# Patient Record
Sex: Female | Born: 1955 | Race: White | Hispanic: No | Marital: Married | Smoking: Former smoker
Health system: Southern US, Community
[De-identification: ages and names within clinical notes are randomized; demographics above are authoritative.]

## PROBLEM LIST (undated history)

## (undated) DIAGNOSIS — L309 Dermatitis, unspecified: Secondary | ICD-10-CM

## (undated) DIAGNOSIS — C801 Malignant (primary) neoplasm, unspecified: Secondary | ICD-10-CM

## (undated) DIAGNOSIS — I10 Essential (primary) hypertension: Secondary | ICD-10-CM

## (undated) DIAGNOSIS — R112 Nausea with vomiting, unspecified: Secondary | ICD-10-CM

## (undated) DIAGNOSIS — T7840XA Allergy, unspecified, initial encounter: Secondary | ICD-10-CM

## (undated) DIAGNOSIS — F17200 Nicotine dependence, unspecified, uncomplicated: Secondary | ICD-10-CM

## (undated) DIAGNOSIS — J45909 Unspecified asthma, uncomplicated: Secondary | ICD-10-CM

## (undated) DIAGNOSIS — L0390 Cellulitis, unspecified: Secondary | ICD-10-CM

## (undated) DIAGNOSIS — H57059 Tonic pupil, unspecified eye: Secondary | ICD-10-CM

## (undated) DIAGNOSIS — Z923 Personal history of irradiation: Secondary | ICD-10-CM

## (undated) DIAGNOSIS — T8859XA Other complications of anesthesia, initial encounter: Secondary | ICD-10-CM

## (undated) DIAGNOSIS — T4145XA Adverse effect of unspecified anesthetic, initial encounter: Secondary | ICD-10-CM

## (undated) DIAGNOSIS — I700 Atherosclerosis of aorta: Secondary | ICD-10-CM

## (undated) DIAGNOSIS — Z9889 Other specified postprocedural states: Secondary | ICD-10-CM

## (undated) HISTORY — PX: CATARACT EXTRACTION W/ INTRAOCULAR LENS IMPLANT: SHX1309

## (undated) HISTORY — DX: Atherosclerosis of aorta: I70.0

## (undated) HISTORY — DX: Essential (primary) hypertension: I10

## (undated) HISTORY — DX: Unspecified asthma, uncomplicated: J45.909

## (undated) HISTORY — DX: Dermatitis, unspecified: L30.9

## (undated) HISTORY — DX: Allergy, unspecified, initial encounter: T78.40XA

## (undated) HISTORY — DX: Nicotine dependence, unspecified, uncomplicated: F17.200

## (undated) HISTORY — PX: ABDOMINAL HYSTERECTOMY: SHX81

---

## 1977-08-30 HISTORY — PX: BREAST SURGERY: SHX581

## 1977-08-30 HISTORY — PX: BREAST EXCISIONAL BIOPSY: SUR124

## 1999-11-30 ENCOUNTER — Encounter: Admission: RE | Admit: 1999-11-30 | Discharge: 1999-11-30 | Payer: Self-pay | Admitting: Obstetrics and Gynecology

## 1999-11-30 ENCOUNTER — Encounter: Payer: Self-pay | Admitting: Obstetrics and Gynecology

## 2003-09-30 ENCOUNTER — Inpatient Hospital Stay (HOSPITAL_COMMUNITY): Admission: EM | Admit: 2003-09-30 | Discharge: 2003-10-01 | Payer: Self-pay | Admitting: Emergency Medicine

## 2004-01-02 ENCOUNTER — Other Ambulatory Visit: Admission: RE | Admit: 2004-01-02 | Discharge: 2004-01-02 | Payer: Self-pay | Admitting: Gynecology

## 2005-05-11 ENCOUNTER — Encounter: Admission: RE | Admit: 2005-05-11 | Discharge: 2005-05-11 | Payer: Self-pay | Admitting: Gynecology

## 2005-05-21 ENCOUNTER — Encounter: Admission: RE | Admit: 2005-05-21 | Discharge: 2005-05-21 | Payer: Self-pay | Admitting: Gynecology

## 2005-06-21 ENCOUNTER — Other Ambulatory Visit: Admission: RE | Admit: 2005-06-21 | Discharge: 2005-06-21 | Payer: Self-pay | Admitting: Gynecology

## 2006-02-15 ENCOUNTER — Encounter: Admission: RE | Admit: 2006-02-15 | Discharge: 2006-02-15 | Payer: Self-pay | Admitting: Neurology

## 2006-10-25 ENCOUNTER — Other Ambulatory Visit: Admission: RE | Admit: 2006-10-25 | Discharge: 2006-10-25 | Payer: Self-pay | Admitting: Gynecology

## 2006-10-31 ENCOUNTER — Encounter: Admission: RE | Admit: 2006-10-31 | Discharge: 2006-10-31 | Payer: Self-pay | Admitting: Gynecology

## 2014-11-28 ENCOUNTER — Other Ambulatory Visit: Payer: Self-pay | Admitting: Family Medicine

## 2015-01-09 ENCOUNTER — Encounter: Payer: Self-pay | Admitting: *Deleted

## 2015-01-13 ENCOUNTER — Encounter: Payer: Self-pay | Admitting: Family Medicine

## 2015-01-13 ENCOUNTER — Ambulatory Visit (INDEPENDENT_AMBULATORY_CARE_PROVIDER_SITE_OTHER): Payer: 59 | Admitting: Family Medicine

## 2015-01-13 VITALS — BP 156/86 | HR 88 | Temp 98.0°F | Resp 18 | Ht 61.0 in | Wt 132.0 lb

## 2015-01-13 DIAGNOSIS — Z7189 Other specified counseling: Secondary | ICD-10-CM

## 2015-01-13 DIAGNOSIS — Z716 Tobacco abuse counseling: Secondary | ICD-10-CM | POA: Diagnosis not present

## 2015-01-13 DIAGNOSIS — F17200 Nicotine dependence, unspecified, uncomplicated: Secondary | ICD-10-CM | POA: Insufficient documentation

## 2015-01-13 DIAGNOSIS — I10 Essential (primary) hypertension: Secondary | ICD-10-CM

## 2015-01-13 DIAGNOSIS — J4530 Mild persistent asthma, uncomplicated: Secondary | ICD-10-CM

## 2015-01-13 DIAGNOSIS — Z720 Tobacco use: Secondary | ICD-10-CM

## 2015-01-13 DIAGNOSIS — Z7689 Persons encountering health services in other specified circumstances: Secondary | ICD-10-CM

## 2015-01-13 MED ORDER — VARENICLINE TARTRATE 0.5 MG X 11 & 1 MG X 42 PO MISC: ORAL | Status: DC

## 2015-01-13 NOTE — Progress Notes (Signed)
Subjective:    Patient ID: Krystal Brooks, female    DOB: 1955/10/26, 59 y.o.   MRN: [id number]  HPI Patient is a very pleasant 59 year old white female who is here today to establish care. Past medical history is significant for essential hypertension. She states her blood pressure is better controlled at home and that she is a little nervous today in the office. She also has a history of tobacco abuse. She also has a history of mild persistent asthma. Allergies are certainly a trigger for her. At the present time her asthma is relatively well controlled on a combination of Singulair and dulera.  She requires albuterol 2-3 times a month. Unfortunate she continues to smoke although she is very interested in smoking cessation. She will schedule her mammogram later this month. Her last colonoscopy was 3 years ago and is not due again for 7 years. She also has a past medical history of a hysterectomy and therefore does not require Pap smears. She has a history of reactions to immunizations and she refuses any immunizations including a pneumonia shot. Past Medical History  Diagnosis Date  . Allergy   . Asthma   . Hypertension   . Smoker    Past Surgical History  Procedure Laterality Date  . Cesarean section    . Abdominal hysterectomy     Current Outpatient Prescriptions on File Prior to Visit  Medication Sig Dispense Refill  . albuterol (PROVENTIL HFA;VENTOLIN HFA) 108 (90 BASE) MCG/ACT inhaler Inhale 2 puffs into the lungs every 6 (six) hours as needed for wheezing or shortness of breath.    . losartan-hydrochlorothiazide (HYZAAR) 50-12.5 MG per tablet Take 1 tablet by mouth daily.    . mometasone-formoterol (DULERA) 100-5 MCG/ACT AERO Inhale 2 puffs into the lungs daily.    . montelukast (SINGULAIR) 10 MG tablet Take 10 mg by mouth at bedtime.     No current facility-administered medications on file prior to visit.   Allergies  Allergen Reactions  . Penicillins Swelling and Rash  .  Sulfa Antibiotics Rash   History   Social History  . Marital Status: Married    Spouse Name: N/A  . Number of Children: N/A  . Years of Education: N/A   Occupational History  . Not on file.   Social History Main Topics  . Smoking status: Current Every Day Smoker -- 0.50 packs/day    Types: Cigarettes  . Smokeless tobacco: Never Used  . Alcohol Use: No  . Drug Use: No  . Sexual Activity: Yes    Birth Control/ Protection: Surgical     Comment: hysterectomy   Other Topics Concern  . Not on file   Social History Narrative      Review of Systems  All other systems reviewed and are negative.      Objective:   Physical Exam  Constitutional: She appears well-developed and well-nourished.  HENT:  Mouth/Throat: Oropharynx is clear and moist.  Eyes: Conjunctivae are normal. Pupils are equal, round, and reactive to light.  Neck: Neck supple. No JVD present. No thyromegaly present.  Cardiovascular: Normal rate, regular rhythm and normal heart sounds.   No murmur heard. Pulmonary/Chest: Effort normal and breath sounds normal. No respiratory distress. She has no wheezes. She has no rales.  Abdominal: Soft. Bowel sounds are normal. She exhibits no distension. There is no tenderness. There is no rebound and no guarding.  Musculoskeletal: She exhibits no edema.  Lymphadenopathy:    She has no cervical adenopathy.  Vitals reviewed.         Assessment & Plan:  Encounter for smoking cessation counseling - Plan: varenicline (CHANTIX STARTING MONTH PAK) 0.5 MG X 11 & 1 MG X 42 tablet  Benign essential HTN - Plan: CBC with Differential/Platelet, COMPLETE METABOLIC PANEL WITH GFR, Lipid panel  Asthma, chronic, mild persistent, uncomplicated  Establishing care with new doctor, encounter for  Physical exam is normal. I'm concerned about her blood pressure. I have asked her to check her blood pressure everyday for the next week and report the values to me. If increased, I would  add amlodipine. I asked her to return fasting for a CBC, fasting lipid panel, and CMP. I recommended smoking cessation and gave the patient a prescription for Chantix starter pack. Also offer the patient a pneumonia vaccine but she declined that today. She will schedule her mammogram and her colonoscopy is up-to-date.

## 2015-01-24 ENCOUNTER — Other Ambulatory Visit: Payer: Self-pay | Admitting: Family Medicine

## 2015-01-24 NOTE — Telephone Encounter (Signed)
I called patient and reminded her that she needs to return for the fasting lab work provider ordered at her last visit.  One month refill BP med sent

## 2015-02-17 ENCOUNTER — Other Ambulatory Visit: Payer: 59

## 2015-02-17 LAB — COMPLETE METABOLIC PANEL WITH GFR
ALBUMIN: 4.2 g/dL (ref 3.5–5.2)
ALT: 16 U/L (ref 0–35)
AST: 16 U/L (ref 0–37)
Alkaline Phosphatase: 53 U/L (ref 39–117)
BUN: 11 mg/dL (ref 6–23)
CALCIUM: 9.3 mg/dL (ref 8.4–10.5)
CHLORIDE: 95 meq/L — AB (ref 96–112)
CO2: 28 meq/L (ref 19–32)
Creat: 0.57 mg/dL (ref 0.50–1.10)
GFR, Est African American: >89 mL/min
GFR, Est Non African American: >89 mL/min
GLUCOSE: 108 mg/dL — AB (ref 70–99)
Potassium: 4.2 mEq/L (ref 3.5–5.3)
Sodium: 134 mEq/L — ABNORMAL LOW (ref 135–145)
Total Bilirubin: 0.5 mg/dL (ref 0.2–1.2)
Total Protein: 6.8 g/dL (ref 6.0–8.3)

## 2015-02-17 LAB — CBC WITH DIFFERENTIAL/PLATELET
Basophils Absolute: 0 10*3/uL (ref 0.0–0.1)
Basophils Relative: 0 % (ref 0–1)
Eosinophils Absolute: 0.1 10*3/uL (ref 0.0–0.7)
Eosinophils Relative: 1 % (ref 0–5)
HCT: 37.9 % (ref 36.0–46.0)
Hemoglobin: 12.6 g/dL (ref 12.0–15.0)
LYMPHS PCT: 39 % (ref 12–46)
Lymphs Abs: 2.1 10*3/uL (ref 0.7–4.0)
MCH: 29 pg (ref 26.0–34.0)
MCHC: 33.2 g/dL (ref 30.0–36.0)
MCV: 87.1 fL (ref 78.0–100.0)
MPV: 10.2 fL (ref 8.6–12.4)
Monocytes Absolute: 0.5 10*3/uL (ref 0.1–1.0)
Monocytes Relative: 9 % (ref 3–12)
NEUTROS ABS: 2.7 10*3/uL (ref 1.7–7.7)
NEUTROS PCT: 51 % (ref 43–77)
PLATELETS: 192 10*3/uL (ref 150–400)
RBC: 4.35 MIL/uL (ref 3.87–5.11)
RDW: 13.9 % (ref 11.5–15.5)
WBC: 5.3 10*3/uL (ref 4.0–10.5)

## 2015-02-17 LAB — LIPID PANEL
CHOL/HDL RATIO: 3.3 ratio
CHOLESTEROL: 195 mg/dL (ref 0–200)
HDL: 60 mg/dL (ref >=46)
LDL Cholesterol: 119 mg/dL — ABNORMAL HIGH (ref 0–99)
TRIGLYCERIDES: 81 mg/dL (ref <150)
VLDL: 16 mg/dL (ref 0–40)

## 2015-02-18 ENCOUNTER — Encounter: Payer: Self-pay | Admitting: Family Medicine

## 2015-02-21 ENCOUNTER — Other Ambulatory Visit: Payer: Self-pay | Admitting: Family Medicine

## 2015-02-22 ENCOUNTER — Other Ambulatory Visit: Payer: Self-pay | Admitting: Family Medicine

## 2015-05-13 ENCOUNTER — Other Ambulatory Visit: Payer: Self-pay | Admitting: Family Medicine

## 2015-05-16 ENCOUNTER — Ambulatory Visit (INDEPENDENT_AMBULATORY_CARE_PROVIDER_SITE_OTHER): Payer: Commercial Managed Care - HMO | Admitting: Family Medicine

## 2015-05-16 ENCOUNTER — Encounter: Payer: Self-pay | Admitting: Family Medicine

## 2015-05-16 VITALS — BP 142/80 | HR 104 | Temp 98.8°F | Resp 22 | Wt 137.0 lb

## 2015-05-16 DIAGNOSIS — J45901 Unspecified asthma with (acute) exacerbation: Secondary | ICD-10-CM

## 2015-05-16 MED ORDER — PREDNISONE 20 MG PO TABS
ORAL_TABLET | ORAL | Status: DC
Start: 2015-05-16 — End: 2015-05-20

## 2015-05-16 MED ORDER — ALBUTEROL SULFATE HFA 108 (90 BASE) MCG/ACT IN AERS: INHALATION_SPRAY | RESPIRATORY_TRACT | Status: DC

## 2015-05-16 NOTE — Progress Notes (Signed)
   Subjective:    Patient ID: Krystal Brooks, female    DOB: 02-10-1956, 59 y.o.   MRN: [id number]  HPI Patient is a very pleasant 59 year old white female with a history of moderate persistent asthma complicated by smoking. Beginning Monday she developed increasing shortness of breath and wheezing. This was triggered by hayfever. They're cutting hay next her home. Is steadily worsened throughout the week. Now she complains of tightness in her chest, persistent cough, shortness of breath, and wheezing. She is having to use her albuterol every 4 hours with minimal relief. She denies any chest pain Past Medical History  Diagnosis Date  . Allergy   . Asthma   . Hypertension   . Smoker    Past Surgical History  Procedure Laterality Date  . Cesarean section    . Abdominal hysterectomy     Current Outpatient Prescriptions on File Prior to Visit  Medication Sig Dispense Refill  . losartan-hydrochlorothiazide (HYZAAR) 50-12.5 MG per tablet TAKE 1 TABLET BY MOUTH EVERY DAY 30 tablet 2  . mometasone-formoterol (DULERA) 100-5 MCG/ACT AERO Inhale 2 puffs into the lungs daily.    . montelukast (SINGULAIR) 10 MG tablet TAKE 1 TABLET BY MOUTH EVERY DAY 30 tablet 5   No current facility-administered medications on file prior to visit.   Allergies  Allergen Reactions  . Penicillins Swelling and Rash  . Sulfa Antibiotics Rash   Social History   Social History  . Marital Status: Married    Spouse Name: N/A  . Number of Children: N/A  . Years of Education: N/A   Occupational History  . Not on file.   Social History Main Topics  . Smoking status: Current Every Day Smoker -- 0.50 packs/day    Types: Cigarettes  . Smokeless tobacco: Never Used  . Alcohol Use: No  . Drug Use: No  . Sexual Activity: Yes    Birth Control/ Protection: Surgical     Comment: hysterectomy   Other Topics Concern  . Not on file   Social History Narrative      Review of Systems  All other systems reviewed  and are negative.      Objective:   Physical Exam  Cardiovascular: Normal rate, regular rhythm and normal heart sounds.   Pulmonary/Chest: Effort normal. She has decreased breath sounds. She has wheezes.  Vitals reviewed.         Assessment & Plan:  Asthma with acute exacerbation, unspecified asthma severity - Plan: predniSONE (DELTASONE) 20 MG tablet  Patient is having an asthma exacerbation. Begin prednisone taper pack. Use albuterol 2 puffs inhaled every 4-6 hours. Recheck Monday or immediately if worse.

## 2015-05-20 ENCOUNTER — Telehealth: Payer: Self-pay | Admitting: Family Medicine

## 2015-05-20 DIAGNOSIS — J45901 Unspecified asthma with (acute) exacerbation: Secondary | ICD-10-CM

## 2015-05-20 MED ORDER — PREDNISONE 20 MG PO TABS: ORAL_TABLET | ORAL | Status: DC

## 2015-05-20 NOTE — Telephone Encounter (Signed)
Per your note you wanted her to follow up Monday but no appt was made

## 2015-05-20 NOTE — Telephone Encounter (Signed)
Bump prednisone up to 60 mg poqday for 5 days straight. Get CXR if worsening.

## 2015-05-20 NOTE — Telephone Encounter (Signed)
Patient is not much better than she was her last visit would like a call back to figure out what she should do next  She is going out of town  [phone]

## 2015-05-20 NOTE — Telephone Encounter (Signed)
Pt aware, med sent to pharm and she will wait to see what the pred does before getting a CXR

## 2015-06-06 ENCOUNTER — Other Ambulatory Visit: Payer: Self-pay | Admitting: Family Medicine

## 2015-06-06 NOTE — Telephone Encounter (Signed)
[place] with continuing month pack.

## 2015-06-06 NOTE — Telephone Encounter (Signed)
Medication refilled per protocol. 

## 2015-06-06 NOTE — Telephone Encounter (Signed)
OK 

## 2015-06-12 ENCOUNTER — Encounter: Payer: Self-pay | Admitting: Family Medicine

## 2015-06-12 ENCOUNTER — Ambulatory Visit (INDEPENDENT_AMBULATORY_CARE_PROVIDER_SITE_OTHER): Payer: Commercial Managed Care - HMO | Admitting: Family Medicine

## 2015-06-12 VITALS — BP 140/78 | HR 82 | Temp 98.0°F | Resp 16 | Ht 61.0 in | Wt 138.0 lb

## 2015-06-12 DIAGNOSIS — J410 Simple chronic bronchitis: Secondary | ICD-10-CM

## 2015-06-12 DIAGNOSIS — B001 Herpesviral vesicular dermatitis: Secondary | ICD-10-CM | POA: Diagnosis not present

## 2015-06-12 MED ORDER — VALACYCLOVIR HCL 1 G PO TABS: 2000.0000 mg | ORAL_TABLET | Freq: Two times a day (BID) | ORAL | Status: DC

## 2015-06-12 NOTE — Progress Notes (Signed)
Subjective:    Patient ID: Krystal Brooks, female    DOB: 12-Nov-1955, 59 y.o.   MRN: [id number]  HPI I saw the patient in early September. At that time I gave her a prednisone taper pack. I presume to be asthmatic bronchitis/asthma exacerbation. Symptoms improved but never completely went away. She took an additional prednisone pack shortly thereafter. After taking this prednisone, she developed myalgias, body aches, fevers, chill, and worsening chest congestion. She was seen in urgent care and diagnosed with walking pneumonia and was given clarithromycin and prednisone. She continues to have chest congestion, shortness of breath, dyspnea on exertion, and wheezing. However she states that she is much better she is still not back to normal. She also continues to have a productive cough. Patient is a 30+ pack year smoker with an underlying history of moderate persistent asthma. She's also developed a cold sore on her upper lip Past Medical History  Diagnosis Date  . Allergy   . Asthma   . Hypertension   . Smoker    Past Surgical History  Procedure Laterality Date  . Cesarean section    . Abdominal hysterectomy     Current Outpatient Prescriptions on File Prior to Visit  Medication Sig Dispense Refill  . albuterol (VENTOLIN HFA) 108 (90 BASE) MCG/ACT inhaler USE 1-2 PUFFS EVERY 6 HOURS AS NEEDED FOR WHEEZING 18 Inhaler 3  . CHANTIX STARTING MONTH PAK 0.5 MG X 11 & 1 MG X 42 tablet TAKE AS DIRECTED 53 tablet 0  . losartan-hydrochlorothiazide (HYZAAR) 50-12.5 MG per tablet TAKE 1 TABLET BY MOUTH EVERY DAY 30 tablet 2  . mometasone-formoterol (DULERA) 100-5 MCG/ACT AERO Inhale 2 puffs into the lungs daily.    . montelukast (SINGULAIR) 10 MG tablet TAKE 1 TABLET BY MOUTH EVERY DAY 30 tablet 5  . predniSONE (DELTASONE) 20 MG tablet 3 tabs poqday x 5 days 15 tablet 0   No current facility-administered medications on file prior to visit.   Allergies  Allergen Reactions  . Penicillins Swelling  and Rash  . Sulfa Antibiotics Rash   Social History   Social History  . Marital Status: Married    Spouse Name: N/A  . Number of Children: N/A  . Years of Education: N/A   Occupational History  . Not on file.   Social History Main Topics  . Smoking status: Current Every Day Smoker -- 0.50 packs/day    Types: Cigarettes  . Smokeless tobacco: Never Used  . Alcohol Use: No  . Drug Use: No  . Sexual Activity: Yes    Birth Control/ Protection: Surgical     Comment: hysterectomy   Other Topics Concern  . Not on file   Social History Narrative      Review of Systems  All other systems reviewed and are negative.      Objective:   Physical Exam  Constitutional: She appears well-developed and well-nourished.  HENT:  Right Ear: External ear normal.  Left Ear: External ear normal.  Nose: Nose normal.  Mouth/Throat: Oropharynx is clear and moist. No oropharyngeal exudate.  Neck: Neck supple.  Cardiovascular: Normal rate, regular rhythm and normal heart sounds.   Pulmonary/Chest: Effort normal. No respiratory distress. She has decreased breath sounds. She has wheezes. She has no rales.  Abdominal: Soft. Bowel sounds are normal. She exhibits no distension. There is no tenderness. There is no rebound.  Skin: No rash noted.  Vitals reviewed.         Assessment & Plan:  Simple chronic bronchitis ([place]) - Plan: DG Chest 2 View  Cold sore - Plan: valACYclovir (VALTREX) 1000 MG tablet  I believe the patient has chronic bronchitis. I believe this is a sign that her asthma is evolving more into a COPD from her smoking. I have recommended smoking cessation. She is already trying her best and is taking Chantix. She has not smoked in 2 weeks. I want the patient to immediately go get a chest x-ray to rule out other pathology. I performed pulmonary function test today in office. FEV1 to FVC ratio of 61%. FEV1 is 1.1 L which is 50% of predicted. This qualifies the patient for  borderline stage III COPD. Therefore the patient to discontinue doing and begin anoro 1 inh a day.  Recheck in one week if no better or sooner if worse

## 2015-06-13 ENCOUNTER — Ambulatory Visit
Admission: RE | Admit: 2015-06-13 | Discharge: 2015-06-13 | Disposition: A | Discharge status: Home / Self Care | Payer: Commercial Managed Care - HMO | Source: Ambulatory Visit | Attending: Family Medicine | Admitting: Family Medicine

## 2015-06-13 ENCOUNTER — Encounter: Payer: Self-pay | Admitting: Family Medicine

## 2015-06-13 DIAGNOSIS — J410 Simple chronic bronchitis: Secondary | ICD-10-CM

## 2015-06-16 ENCOUNTER — Encounter: Payer: Self-pay | Admitting: Family Medicine

## 2015-06-16 ENCOUNTER — Ambulatory Visit (INDEPENDENT_AMBULATORY_CARE_PROVIDER_SITE_OTHER): Payer: Commercial Managed Care - HMO | Admitting: Family Medicine

## 2015-06-16 ENCOUNTER — Other Ambulatory Visit: Payer: Self-pay | Admitting: Family Medicine

## 2015-06-16 VITALS — BP 130/94 | HR 106 | Temp 97.9°F | Resp 16 | Ht 61.0 in | Wt 137.0 lb

## 2015-06-16 DIAGNOSIS — B029 Zoster without complications: Secondary | ICD-10-CM | POA: Diagnosis not present

## 2015-06-16 MED ORDER — GABAPENTIN 300 MG PO CAPS: 300.0000 mg | ORAL_CAPSULE | Freq: Three times a day (TID) | ORAL | Status: DC

## 2015-06-16 MED ORDER — VALACYCLOVIR HCL 1 G PO TABS: 1000.0000 mg | ORAL_TABLET | Freq: Two times a day (BID) | ORAL | Status: DC

## 2015-06-16 MED ORDER — VALACYCLOVIR HCL 1 G PO TABS: 1000.0000 mg | ORAL_TABLET | Freq: Three times a day (TID) | ORAL | Status: DC

## 2015-06-16 NOTE — Telephone Encounter (Signed)
Refill appropriate and filled per protocol. 

## 2015-06-16 NOTE — Progress Notes (Signed)
Subjective:    Patient ID: Krystal Brooks, female    DOB: 07/03/1956, 59 y.o.   MRN: [id number]  HPI 06/12/15 I saw the patient in early September. At that time I gave her a prednisone taper pack. I presume to be asthmatic bronchitis/asthma exacerbation. Symptoms improved but never completely went away. She took an additional prednisone pack shortly thereafter. After taking this prednisone, she developed myalgias, body aches, fevers, chill, and worsening chest congestion. She was seen in urgent care and diagnosed with walking pneumonia and was given clarithromycin and prednisone. She continues to have chest congestion, shortness of breath, dyspnea on exertion, and wheezing. However she states that she is much better she is still not back to normal. She also continues to have a productive cough. Patient is a 60+ pack year smoker with an underlying history of moderate persistent asthma. She's also developed a cold sore on her upper lip.  AT that time, my plan was: I believe the patient has chronic bronchitis. I believe this is a sign that her asthma is evolving more into a COPD from her smoking. I have recommended smoking cessation. She is already trying her best and is taking Chantix. She has not smoked in 2 weeks. I want the patient to immediately go get a chest x-ray to rule out other pathology. I performed pulmonary function test today in office. FEV1 to FVC ratio of 61%. FEV1 is 1.1 L which is 50% of predicted. This qualifies the patient for borderline stage III COPD. Therefore the patient to discontinue doing and begin anoro 1 inh a day.  Recheck in one week if no better or sooner if worse  06/16/15 The cold sore on the tip of her upper lip has now progressed to vesicular ulcer on the palate of her lip as well as on the posterior aspect of the palate near the uvula. All these lesions on the left side of her mouth and her palate. She is also having some numbness and tingling and burning and itching in  her left nostril around her left eye and along the left side of her scalp. There is no visible rash in these areas but symptoms are concerning for shingles. She is also having a dull headache. She denies any mental status changes.  She denies any fevers or chills or meningismus Past Medical History  Diagnosis Date  . Allergy   . Asthma   . Hypertension   . Smoker    Past Surgical History  Procedure Laterality Date  . Cesarean section    . Abdominal hysterectomy     Current Outpatient Prescriptions on File Prior to Visit  Medication Sig Dispense Refill  . albuterol (VENTOLIN HFA) 108 (90 BASE) MCG/ACT inhaler USE 1-2 PUFFS EVERY 6 HOURS AS NEEDED FOR WHEEZING 18 Inhaler 3  . losartan-hydrochlorothiazide (HYZAAR) 50-12.5 MG per tablet TAKE 1 TABLET BY MOUTH EVERY DAY 30 tablet 2  . mometasone-formoterol (DULERA) 100-5 MCG/ACT AERO Inhale 2 puffs into the lungs daily.    . montelukast (SINGULAIR) 10 MG tablet TAKE 1 TABLET BY MOUTH EVERY DAY 30 tablet 5   No current facility-administered medications on file prior to visit.   Allergies  Allergen Reactions  . Penicillins Swelling and Rash  . Sulfa Antibiotics Rash   Social History   Social History  . Marital Status: Married    Spouse Name: N/A  . Number of Children: N/A  . Years of Education: N/A   Occupational History  . Not on file.  Social History Main Topics  . Smoking status: Current Every Day Smoker -- 0.50 packs/day    Types: Cigarettes  . Smokeless tobacco: Never Used  . Alcohol Use: No  . Drug Use: No  . Sexual Activity: Yes    Birth Control/ Protection: Surgical     Comment: hysterectomy   Other Topics Concern  . Not on file   Social History Narrative      Review of Systems  All other systems reviewed and are negative.      Objective:   Physical Exam  Constitutional: She appears well-developed and well-nourished.  HENT:  Right Ear: External ear normal.  Left Ear: External ear normal.  Nose:  Nose normal.  Mouth/Throat: Oropharynx is clear and moist. No oropharyngeal exudate.  Neck: Neck supple.  Cardiovascular: Normal rate, regular rhythm and normal heart sounds.   Pulmonary/Chest: Effort normal. No respiratory distress. She has decreased breath sounds. She has wheezes. She has no rales.  Abdominal: Soft. Bowel sounds are normal. She exhibits no distension. There is no tenderness. There is no rebound.  Skin: No rash noted.  Vitals reviewed.         Assessment & Plan:  Shingles outbreak - Plan: valACYclovir (VALTREX) 1000 MG tablet, gabapentin (NEURONTIN) 300 MG capsule, DISCONTINUED: valACYclovir (VALTREX) 1000 MG tablet  I believe the patient is experiencing shingles. At the present time I see no evidence for encephalitis. I will start the patient on bowel checks 1 g by mouth 3 times a day for 7 days. Use gabapentin 300 mg by mouth 3 times a day for pain. Recheck immediately if headache worsens, if she develops neck pain, or she develops worsening headache.

## 2015-07-22 ENCOUNTER — Ambulatory Visit: Payer: Commercial Managed Care - HMO | Admitting: Family Medicine

## 2015-07-22 ENCOUNTER — Ambulatory Visit (INDEPENDENT_AMBULATORY_CARE_PROVIDER_SITE_OTHER): Payer: Commercial Managed Care - HMO | Admitting: Family Medicine

## 2015-07-22 ENCOUNTER — Encounter: Payer: Self-pay | Admitting: Family Medicine

## 2015-07-22 DIAGNOSIS — M5431 Sciatica, right side: Secondary | ICD-10-CM

## 2015-07-22 MED ORDER — GABAPENTIN 300 MG PO CAPS: 300.0000 mg | ORAL_CAPSULE | Freq: Three times a day (TID) | ORAL | Status: DC

## 2015-07-22 MED ORDER — PREDNISONE 20 MG PO TABS: ORAL_TABLET | ORAL | Status: DC

## 2015-07-22 MED ORDER — OXYCODONE-ACETAMINOPHEN 5-325 MG PO TABS: 1.0000 | ORAL_TABLET | Freq: Four times a day (QID) | ORAL | Status: DC | PRN

## 2015-07-22 NOTE — Progress Notes (Signed)
Subjective:    Patient ID: Krystal Brooks, female    DOB: 1956/02/04, 59 y.o.   MRN: [id number]  HPI Patient has had 3 months of severe low back pain radiating into her posterior left gluteus down the posterior aspect of her left leg into her left foot the pain is moderate to severe at times. She's been on prednisone taper packs several times over the last 2 months for COPD and while she would take the prednisone the pain would mitigate somewhat however now the pain has become almost unbearable. She complains of burning stinging pain in the posterior left gluteus down the posterior left leg into the left foot. Nothing provides her relief. She is taking ibuprofen and Aleve together with no relief. She is unable to sleep at night. She denies any symptoms of cauda equina syndrome Past Medical History  Diagnosis Date  . Allergy   . Asthma   . Hypertension   . Smoker    Past Surgical History  Procedure Laterality Date  . Cesarean section    . Abdominal hysterectomy     Current Outpatient Prescriptions on File Prior to Visit  Medication Sig Dispense Refill  . albuterol (VENTOLIN HFA) 108 (90 BASE) MCG/ACT inhaler USE 1-2 PUFFS EVERY 6 HOURS AS NEEDED FOR WHEEZING 18 Inhaler 3  . losartan-hydrochlorothiazide (HYZAAR) 50-12.5 MG per tablet TAKE 1 TABLET BY MOUTH EVERY DAY 30 tablet 2  . mometasone-formoterol (DULERA) 100-5 MCG/ACT AERO Inhale 2 puffs into the lungs daily.    . montelukast (SINGULAIR) 10 MG tablet TAKE 1 TABLET BY MOUTH EVERY DAY 30 tablet 5   No current facility-administered medications on file prior to visit.   Allergies  Allergen Reactions  . Penicillins Swelling and Rash  . Sulfa Antibiotics Rash   Social History   Social History  . Marital Status: Married    Spouse Name: N/A  . Number of Children: N/A  . Years of Education: N/A   Occupational History  . Not on file.   Social History Main Topics  . Smoking status: Former Smoker -- 0.50 packs/day    Types:  Cigarettes    Quit date: 06/13/2015  . Smokeless tobacco: Never Used  . Alcohol Use: No  . Drug Use: No  . Sexual Activity: Yes    Birth Control/ Protection: Surgical     Comment: hysterectomy   Other Topics Concern  . Not on file   Social History Narrative      Review of Systems  All other systems reviewed and are negative.      Objective:   Physical Exam  Cardiovascular: Normal rate, regular rhythm and normal heart sounds.   Pulmonary/Chest: Effort normal and breath sounds normal.  Musculoskeletal:       Lumbar back: She exhibits decreased range of motion and pain. She exhibits no tenderness and no spasm.  Neurological: She displays normal reflexes. She exhibits normal muscle tone. Coordination normal.  Vitals reviewed.         Assessment & Plan:   Right sided sciatica - Plan: predniSONE (DELTASONE) 20 MG tablet, oxyCODONE-acetaminophen (ROXICET) 5-325 MG tablet, gabapentin (NEURONTIN) 300 MG capsule, MR Lumbar Spine Wo Contrast  The diagnosis I coded for the medications is wrong.  I meant to say left-sided sciatica. The patient is expressing left-sided sciatica now for more than 3 months failing conservative therapy. I will proceed with an MRI of the lumbar spine. Given the severity of the pain in the holiday weekend, I will use a  prednisone taper pack to try to mitigate the pain somewhat temporarily. She can also use gabapentin 300 mg by mouth 3 times a day as well as Percocet 5/325 one to 2 tablets every 6 hours as needed.

## 2015-07-26 ENCOUNTER — Other Ambulatory Visit: Payer: Self-pay | Admitting: Family Medicine

## 2015-07-28 ENCOUNTER — Other Ambulatory Visit: Payer: Self-pay | Admitting: Family Medicine

## 2015-07-28 MED ORDER — VARENICLINE TARTRATE 1 MG PO TABS: 1.0000 mg | ORAL_TABLET | Freq: Two times a day (BID) | ORAL | Status: DC

## 2015-07-28 NOTE — Telephone Encounter (Signed)
Patient last given starting month on 06/06/2015.  Please advise.

## 2015-07-28 NOTE — Telephone Encounter (Signed)
Ok with refill BUT will need continuing month pack x 2

## 2015-07-28 NOTE — Telephone Encounter (Signed)
Prescription sent to pharmacy.

## 2015-08-04 ENCOUNTER — Ambulatory Visit
Admission: RE | Admit: 2015-08-04 | Discharge: 2015-08-04 | Disposition: A | Discharge status: Home / Self Care | Payer: Commercial Managed Care - HMO | Source: Ambulatory Visit | Attending: Family Medicine | Admitting: Family Medicine

## 2015-08-04 ENCOUNTER — Other Ambulatory Visit: Payer: Self-pay | Admitting: Family Medicine

## 2015-08-04 DIAGNOSIS — M5431 Sciatica, right side: Secondary | ICD-10-CM

## 2015-08-04 NOTE — Telephone Encounter (Signed)
Medication refilled per protocol. 

## 2015-08-05 ENCOUNTER — Encounter: Payer: Self-pay | Admitting: Family Medicine

## 2015-08-05 DIAGNOSIS — I700 Atherosclerosis of aorta: Secondary | ICD-10-CM | POA: Insufficient documentation

## 2015-08-31 DIAGNOSIS — C801 Malignant (primary) neoplasm, unspecified: Secondary | ICD-10-CM

## 2015-08-31 HISTORY — DX: Malignant (primary) neoplasm, unspecified: C80.1

## 2015-09-03 ENCOUNTER — Other Ambulatory Visit: Payer: Self-pay | Admitting: Family Medicine

## 2015-09-03 DIAGNOSIS — M5416 Radiculopathy, lumbar region: Secondary | ICD-10-CM

## 2015-09-05 ENCOUNTER — Ambulatory Visit (INDEPENDENT_AMBULATORY_CARE_PROVIDER_SITE_OTHER): Payer: Commercial Managed Care - HMO | Admitting: Family Medicine

## 2015-09-05 ENCOUNTER — Encounter: Payer: Self-pay | Admitting: Family Medicine

## 2015-09-05 VITALS — BP 160/90 | HR 98 | Temp 97.8°F | Resp 18 | Ht 61.0 in | Wt 136.0 lb

## 2015-09-05 DIAGNOSIS — J441 Chronic obstructive pulmonary disease with (acute) exacerbation: Secondary | ICD-10-CM | POA: Diagnosis not present

## 2015-09-05 MED ORDER — LOSARTAN POTASSIUM-HCTZ 50-12.5 MG PO TABS: 2.0000 | ORAL_TABLET | Freq: Every day | ORAL | Status: DC

## 2015-09-05 MED ORDER — PREDNISONE 20 MG PO TABS: ORAL_TABLET | ORAL | Status: DC

## 2015-09-05 MED ORDER — AZITHROMYCIN 250 MG PO TABS: ORAL_TABLET | ORAL | Status: DC

## 2015-09-05 MED ORDER — UMECLIDINIUM-VILANTEROL 62.5-25 MCG/INH IN AEPB: 1.0000 | INHALATION_SPRAY | Freq: Every day | RESPIRATORY_TRACT | Status: DC

## 2015-09-05 NOTE — Progress Notes (Signed)
Subjective:    Patient ID: Krystal Brooks, female    DOB: 22-Jan-1956, 60 y.o.   MRN: [id number]  HPI  06/12/15 I saw the patient in early September. At that time I gave her a prednisone taper pack. I presume to be asthmatic bronchitis/asthma exacerbation. Symptoms improved but never completely went away. She took an additional prednisone pack shortly thereafter. After taking this prednisone, she developed myalgias, body aches, fevers, chill, and worsening chest congestion. She was seen in urgent care and diagnosed with walking pneumonia and was given clarithromycin and prednisone. She continues to have chest congestion, shortness of breath, dyspnea on exertion, and wheezing. However she states that she is much better she is still not back to normal. She also continues to have a productive cough. Patient is a 30+ pack year smoker with an underlying history of moderate persistent asthma. She's also developed a cold sore on her upper lip.  At that time, my plan was: I believe the patient has chronic bronchitis. I believe this is a sign that her asthma is evolving more into a COPD from her smoking. I have recommended smoking cessation. She is already trying her best and is taking Chantix. She has not smoked in 2 weeks. I want the patient to immediately go get a chest x-ray to rule out other pathology. I performed pulmonary function test today in office. FEV1 to FVC ratio of 61%. FEV1 is 1.1 L which is 50% of predicted. This qualifies the patient for borderline stage III COPD. Therefore the patient to discontinue dulera and begin anoro 1 inh a day.  Recheck in one week if no better or sooner if worse  09/05/15  blood pressures elevated today at 160/90. Patient also reports head and chest congestion for 1 week. She reports a cough productive of clear mucus. She denies any wheezing. She denies any chest pain. She denies any shortness of breath. She has been compliant with anoro. Past Medical History  Diagnosis  Date  . Allergy   . Asthma   . Hypertension   . Smoker   . Atherosclerosis of aorta (HCC)     bulge above renal arteries on mri 2016   Past Surgical History  Procedure Laterality Date  . Cesarean section    . Abdominal hysterectomy     Current Outpatient Prescriptions on File Prior to Visit  Medication Sig Dispense Refill  . albuterol (VENTOLIN HFA) 108 (90 BASE) MCG/ACT inhaler USE 1-2 PUFFS EVERY 6 HOURS AS NEEDED FOR WHEEZING 18 Inhaler 3  . losartan-hydrochlorothiazide (HYZAAR) 50-12.5 MG per tablet TAKE 1 TABLET BY MOUTH EVERY DAY 30 tablet 2  . mometasone-formoterol (DULERA) 100-5 MCG/ACT AERO Inhale 2 puffs into the lungs daily.    . montelukast (SINGULAIR) 10 MG tablet TAKE 1 TABLET BY MOUTH EVERY DAY 90 tablet 1  . oxyCODONE-acetaminophen (ROXICET) 5-325 MG tablet Take 1-2 tablets by mouth every 6 (six) hours as needed for severe pain. 30 tablet 0  . varenicline (CHANTIX CONTINUING MONTH PAK) 1 MG tablet Take 1 tablet (1 mg total) by mouth 2 (two) times daily. 60 tablet 2   No current facility-administered medications on file prior to visit.   Allergies  Allergen Reactions  . Penicillins Swelling and Rash  . Sulfa Antibiotics Rash   Social History   Social History  . Marital Status: Married    Spouse Name: N/A  . Number of Children: N/A  . Years of Education: N/A   Occupational History  . Not on  file.   Social History Main Topics  . Smoking status: Former Smoker -- 0.50 packs/day    Types: Cigarettes    Quit date: 06/13/2015  . Smokeless tobacco: Never Used  . Alcohol Use: No  . Drug Use: No  . Sexual Activity: Yes    Birth Control/ Protection: Surgical     Comment: hysterectomy   Other Topics Concern  . Not on file   Social History Narrative      Review of Systems  All other systems reviewed and are negative.      Objective:   Physical Exam  Constitutional: She appears well-developed and well-nourished.  HENT:  Right Ear: External ear  normal.  Left Ear: External ear normal.  Nose: Nose normal.  Mouth/Throat: Oropharynx is clear and moist. No oropharyngeal exudate.  Neck: Neck supple.  Cardiovascular: Normal rate, regular rhythm and normal heart sounds.   Pulmonary/Chest: Effort normal. No respiratory distress. She has decreased breath sounds. She has no wheezes. She has no rales.  Abdominal: Soft. Bowel sounds are normal. She exhibits no distension. There is no tenderness. There is no rebound.  Skin: No rash noted.  Vitals reviewed.         Assessment & Plan:  COPD exacerbation ([place]) - Plan: azithromycin (ZITHROMAX) 250 MG tablet, predniSONE (DELTASONE) 20 MG tablet   patient has not developed a COPD exacerbation yet. I believe the symptoms are consistent with a viral upper respiratory infection. At the present time I recommended Mucinex DM and nothing else. However they're supposed to be heavy snow this weekend. The patient has a history of rapid  Decompensation into a COPD exacerbation. Therefore I will give her a Z-Pak and a prednisone taper pack. She will not take the medication unless she develops severe wheezing, shortness of breath, or purulent sputum. She just wants a medicine to have in case she gets  Snowed in at home. I will also increase Hyzaar to 100/25 by mouth daily

## 2015-09-06 ENCOUNTER — Other Ambulatory Visit: Payer: Self-pay | Admitting: Family Medicine

## 2015-11-03 ENCOUNTER — Encounter: Payer: Self-pay | Admitting: Family Medicine

## 2015-11-03 ENCOUNTER — Ambulatory Visit (INDEPENDENT_AMBULATORY_CARE_PROVIDER_SITE_OTHER): Payer: Commercial Managed Care - HMO | Admitting: Family Medicine

## 2015-11-03 VITALS — BP 148/82 | HR 100 | Temp 98.6°F | Resp 20 | Ht 61.0 in | Wt 134.0 lb

## 2015-11-03 DIAGNOSIS — R509 Fever, unspecified: Secondary | ICD-10-CM | POA: Diagnosis not present

## 2015-11-03 DIAGNOSIS — B349 Viral infection, unspecified: Secondary | ICD-10-CM

## 2015-11-03 LAB — INFLUENZA A AND B AG, IMMUNOASSAY
INFLUENZA A ANTIGEN: NOT DETECTED
INFLUENZA B ANTIGEN: NOT DETECTED

## 2015-11-03 MED ORDER — ONDANSETRON HCL 4 MG PO TABS: 4.0000 mg | ORAL_TABLET | Freq: Four times a day (QID) | ORAL | Status: DC | PRN

## 2015-11-03 NOTE — Progress Notes (Signed)
Subjective:    Patient ID: Krystal Brooks, female    DOB: [birth year], 60 y.o.   MRN: [id number]  HPI  Symptoms began in earnest on Friday with fever, diffuse body aches, and severe nausea. She denies any vomiting but she says she is very nauseated. She is also been having some diarrhea. She denies any rhinorrhea. She denies any sore throat. She denies any sinus pain or otalgia. She does have a cough that is slightly worse than her baseline. She denies any purulent sputum. Past medical history is significant for COPD and frequent COPD exacerbations but thankfully the patient has avoided that up until this point. Her primary complaint is severe fatigue, myalgias, and nausea Past Medical History  Diagnosis Date  . Allergy   . Asthma   . Hypertension   . Smoker   . Atherosclerosis of aorta (HCC)     bulge above renal arteries on mri 2016   Past Surgical History  Procedure Laterality Date  . Cesarean section    . Abdominal hysterectomy     Current Outpatient Prescriptions on File Prior to Visit  Medication Sig Dispense Refill  . albuterol (VENTOLIN HFA) 108 (90 BASE) MCG/ACT inhaler USE 1-2 PUFFS EVERY 6 HOURS AS NEEDED FOR WHEEZING 18 Inhaler 3  . losartan-hydrochlorothiazide (HYZAAR) 50-12.5 MG tablet Take 2 tablets by mouth daily. 60 tablet 2  . montelukast (SINGULAIR) 10 MG tablet TAKE 1 TABLET BY MOUTH EVERY DAY 90 tablet 1  . Umeclidinium-Vilanterol (ANORO ELLIPTA) 62.5-25 MCG/INH AEPB Inhale 1 puff into the lungs daily. 1 each 11   No current facility-administered medications on file prior to visit.   Allergies  Allergen Reactions  . Penicillins Swelling and Rash  . Sulfa Antibiotics Rash   Social History   Social History  . Marital Status: Married    Spouse Name: N/A  . Number of Children: N/A  . Years of Education: N/A   Occupational History  . Not on file.   Social History Main Topics  . Smoking status: Former Smoker -- 0.50 packs/day    Types: Cigarettes   Quit date: 06/13/2015  . Smokeless tobacco: Never Used  . Alcohol Use: No  . Drug Use: No  . Sexual Activity: Yes    Birth Control/ Protection: Surgical     Comment: hysterectomy   Other Topics Concern  . Not on file   Social History Narrative      Review of Systems  All other systems reviewed and are negative.      Objective:   Physical Exam  Constitutional: She appears well-developed and well-nourished. She appears ill.  HENT:  Right Ear: External ear normal.  Left Ear: External ear normal.  Nose: Nose normal.  Mouth/Throat: Oropharynx is clear and moist. No oropharyngeal exudate.  Neck: Neck supple.  Cardiovascular: Normal rate, regular rhythm and normal heart sounds.   Pulmonary/Chest: Effort normal. No respiratory distress. She has decreased breath sounds. She has no wheezes. She has no rales.  Abdominal: Soft. Bowel sounds are normal. She exhibits no distension. There is no tenderness. There is no rebound.  Skin: No rash noted.  Vitals reviewed.         Assessment & Plan:  Fever, unspecified - Plan: Influenza A and B Ag, Immunoassay  Symptoms are consistent with a viral infection/viral gastroenteritis. However given the recent outbreak of influenza in this area, I am going to check the patient for influenza. I will treat her symptomatically with Zofran 4 mg every 6 hours  as needed for nausea or vomiting. I recommended rest, tincture of time, and pushing fluids. Use imodium for diarrhea.  Patient has albuterol at home to use if needed. We must monitor closely for development of a COPD exacerbation as she frequently gets these when she gets sick. Flu test is negative.

## 2015-11-12 ENCOUNTER — Other Ambulatory Visit: Payer: Self-pay

## 2015-11-12 DIAGNOSIS — Z1231 Encounter for screening mammogram for malignant neoplasm of breast: Secondary | ICD-10-CM

## 2015-11-20 ENCOUNTER — Encounter: Payer: Self-pay | Admitting: Family Medicine

## 2015-11-20 ENCOUNTER — Ambulatory Visit (INDEPENDENT_AMBULATORY_CARE_PROVIDER_SITE_OTHER): Payer: Commercial Managed Care - HMO | Admitting: Family Medicine

## 2015-11-20 VITALS — BP 168/92 | HR 110 | Temp 97.9°F | Resp 14 | Ht 61.0 in | Wt 138.0 lb

## 2015-11-20 DIAGNOSIS — I10 Essential (primary) hypertension: Secondary | ICD-10-CM

## 2015-11-20 DIAGNOSIS — G5601 Carpal tunnel syndrome, right upper limb: Secondary | ICD-10-CM

## 2015-11-20 MED ORDER — PREDNISONE 20 MG PO TABS: ORAL_TABLET | ORAL | Status: DC

## 2015-11-20 MED ORDER — AMLODIPINE BESYLATE 10 MG PO TABS: 10.0000 mg | ORAL_TABLET | Freq: Every day | ORAL | Status: DC

## 2015-11-20 NOTE — Progress Notes (Signed)
Subjective:    Patient ID: Krystal Brooks, female    DOB: 09-01-1955, 60 y.o.   MRN: [id number]  HPI 2 weeks ago, the patient was at physical therapy. Afterwards she developed numbness and tingling in her right hand. The numbness is limited to her thumb, her index finger, and her third finger. She has no weakness in her hand. However the numbness has not improved. She does have a history of carpal tunnel syndrome. However this numbness was sudden in onset after she performed exercises related to physical therapy. She denies any strokelike symptoms. She denies any pain in her neck. There is no weakness or pain in her upper extremity. She has a positive Tinel sign and a positive Phalen sign. Past Medical History  Diagnosis Date  . Allergy   . Asthma   . Hypertension   . Smoker   . Atherosclerosis of aorta (HCC)     bulge above renal arteries on mri 2016   Past Surgical History  Procedure Laterality Date  . Cesarean section    . Abdominal hysterectomy     Current Outpatient Prescriptions on File Prior to Visit  Medication Sig Dispense Refill  . albuterol (VENTOLIN HFA) 108 (90 BASE) MCG/ACT inhaler USE 1-2 PUFFS EVERY 6 HOURS AS NEEDED FOR WHEEZING 18 Inhaler 3  . losartan-hydrochlorothiazide (HYZAAR) 50-12.5 MG tablet Take 2 tablets by mouth daily. 60 tablet 2  . montelukast (SINGULAIR) 10 MG tablet TAKE 1 TABLET BY MOUTH EVERY DAY 90 tablet 1  . ondansetron (ZOFRAN) 4 MG tablet Take 1 tablet (4 mg total) by mouth 4 (four) times daily as needed for nausea or vomiting. 20 tablet 0  . Umeclidinium-Vilanterol (ANORO ELLIPTA) 62.5-25 MCG/INH AEPB Inhale 1 puff into the lungs daily. 1 each 11   No current facility-administered medications on file prior to visit.   Allergies  Allergen Reactions  . Penicillins Swelling and Rash  . Sulfa Antibiotics Rash   Social History   Social History  . Marital Status: Married    Spouse Name: N/A  . Number of Children: N/A  . Years of Education:  N/A   Occupational History  . Not on file.   Social History Main Topics  . Smoking status: Former Smoker -- 0.50 packs/day    Types: Cigarettes    Quit date: 06/13/2015  . Smokeless tobacco: Never Used  . Alcohol Use: No  . Drug Use: No  . Sexual Activity: Yes    Birth Control/ Protection: Surgical     Comment: hysterectomy   Other Topics Concern  . Not on file   Social History Narrative      Review of Systems  All other systems reviewed and are negative.      Objective:   Physical Exam  Constitutional: She is oriented to person, place, and time.  Cardiovascular: Normal rate, regular rhythm and normal heart sounds.   Pulmonary/Chest: Effort normal and breath sounds normal.  Neurological: She is alert and oriented to person, place, and time. She has normal reflexes. She displays normal reflexes. No cranial nerve deficit. She exhibits normal muscle tone. Coordination normal.  Vitals reviewed.         Assessment & Plan:  Carpal tunnel syndrome of right wrist - Plan: predniSONE (DELTASONE) 20 MG tablet  Benign essential HTN - Plan: amLODipine (NORVASC) 10 MG tablet  I offered the patient a cortisone injection in her wrist for carpal tunnel syndrome but she is very afraid of needles. She would like to try  prednisone pills first. Also recommended a cock up wrist splint. If symptoms are not improving after 1 week I would return for cortisone injection. Also recommended resting the affected extremity. Her blood pressures also extremely high and I'll add amlodipine 10 mg by mouth daily

## 2015-12-01 ENCOUNTER — Ambulatory Visit (INDEPENDENT_AMBULATORY_CARE_PROVIDER_SITE_OTHER): Payer: Commercial Managed Care - HMO | Admitting: Family Medicine

## 2015-12-01 ENCOUNTER — Encounter: Payer: Self-pay | Admitting: Family Medicine

## 2015-12-01 VITALS — BP 160/86 | HR 100 | Temp 97.3°F | Resp 16 | Ht 61.0 in | Wt 136.0 lb

## 2015-12-01 DIAGNOSIS — G5601 Carpal tunnel syndrome, right upper limb: Secondary | ICD-10-CM

## 2015-12-01 NOTE — Progress Notes (Signed)
Subjective:    Patient ID: Krystal Brooks, female    DOB: [birth year], 60 y.o.   MRN: [id number]  HPI 11/20/15 2 weeks ago, the patient was at physical therapy. Afterwards she developed numbness and tingling in her right hand. The numbness is limited to her thumb, her index finger, and her third finger. She has no weakness in her hand. However the numbness has not improved. She does have a history of carpal tunnel syndrome. However this numbness was sudden in onset after she performed exercises related to physical therapy. She denies any strokelike symptoms. She denies any pain in her neck. There is no weakness or pain in her upper extremity. She has a positive Tinel sign and a positive Phalen sign.  At that time, my plan was: I offered the patient a cortisone injection in her wrist for carpal tunnel syndrome but she is very afraid of needles. She would like to try prednisone pills first. Also recommended a cock up wrist splint. If symptoms are not improving after 1 week I would return for cortisone injection. Also recommended resting the affected extremity. Her blood pressures also extremely high and I'll add amlodipine 10 mg by mouth daily  12/01/15 Symptoms in her hand are no better. She continues to complain of numbness in her right first digit, second digit, and third digit. All symptoms are distal to the wrist. She just started the blood pressure medicine a few days ago. She is extremely anxious and nervous which may be contributing to her blood pressure. Past Medical History  Diagnosis Date  . Allergy   . Asthma   . Hypertension   . Smoker   . Atherosclerosis of aorta (HCC)     bulge above renal arteries on mri 2016   Past Surgical History  Procedure Laterality Date  . Cesarean section    . Abdominal hysterectomy     Current Outpatient Prescriptions on File Prior to Visit  Medication Sig Dispense Refill  . albuterol (VENTOLIN HFA) 108 (90 BASE) MCG/ACT inhaler USE 1-2 PUFFS EVERY 6  HOURS AS NEEDED FOR WHEEZING 18 Inhaler 3  . amLODipine (NORVASC) 10 MG tablet Take 1 tablet (10 mg total) by mouth daily. 90 tablet 3  . losartan-hydrochlorothiazide (HYZAAR) 50-12.5 MG tablet Take 2 tablets by mouth daily. 60 tablet 2  . montelukast (SINGULAIR) 10 MG tablet TAKE 1 TABLET BY MOUTH EVERY DAY 90 tablet 1  . ondansetron (ZOFRAN) 4 MG tablet Take 1 tablet (4 mg total) by mouth 4 (four) times daily as needed for nausea or vomiting. 20 tablet 0  . predniSONE (DELTASONE) 20 MG tablet 3 tabs poqday 1-2, 2 tabs poqday 3-4, 1 tab poqday 5-6 12 tablet 0  . Umeclidinium-Vilanterol (ANORO ELLIPTA) 62.5-25 MCG/INH AEPB Inhale 1 puff into the lungs daily. 1 each 11   No current facility-administered medications on file prior to visit.   Allergies  Allergen Reactions  . Penicillins Swelling and Rash  . Sulfa Antibiotics Rash   Social History   Social History  . Marital Status: Married    Spouse Name: N/A  . Number of Children: N/A  . Years of Education: N/A   Occupational History  . Not on file.   Social History Main Topics  . Smoking status: Former Smoker -- 0.50 packs/day    Types: Cigarettes    Quit date: 06/13/2015  . Smokeless tobacco: Never Used  . Alcohol Use: No  . Drug Use: No  . Sexual Activity: Yes  Birth Control/ Protection: Surgical     Comment: hysterectomy   Other Topics Concern  . Not on file   Social History Narrative      Review of Systems  All other systems reviewed and are negative.      Objective:   Physical Exam  Constitutional: She is oriented to person, place, and time.  Cardiovascular: Normal rate, regular rhythm and normal heart sounds.   Pulmonary/Chest: Effort normal and breath sounds normal.  Neurological: She is alert and oriented to person, place, and time. She has normal reflexes. No cranial nerve deficit. She exhibits normal muscle tone. Coordination normal.  Vitals reviewed.         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 Using sterile technique, I had the patient dorsiflex her wrist. At the proximal flexor crease, I injected 1 mL of 0.1% lidocaine and 1 mL of 40 mg per mL Kenalog just lateral to the palmaris longus tendon into the carpal tunnel. The patient tolerated the procedure well without complication. Recheck blood pressure Friday. If symptoms are not improving, send the patient to hand specialist for nerve conduction studies.

## 2015-12-04 ENCOUNTER — Encounter: Payer: Self-pay | Admitting: Family Medicine

## 2015-12-04 ENCOUNTER — Other Ambulatory Visit: Payer: Self-pay | Admitting: Family Medicine

## 2015-12-04 DIAGNOSIS — G5600 Carpal tunnel syndrome, unspecified upper limb: Secondary | ICD-10-CM

## 2015-12-19 ENCOUNTER — Ambulatory Visit
Admission: RE | Admit: 2015-12-19 | Discharge: 2015-12-19 | Disposition: A | Discharge status: Home / Self Care | Payer: Commercial Managed Care - HMO | Source: Ambulatory Visit

## 2015-12-19 DIAGNOSIS — Z1231 Encounter for screening mammogram for malignant neoplasm of breast: Secondary | ICD-10-CM

## 2015-12-22 ENCOUNTER — Other Ambulatory Visit: Payer: Self-pay | Admitting: Family Medicine

## 2015-12-22 DIAGNOSIS — R928 Other abnormal and inconclusive findings on diagnostic imaging of breast: Secondary | ICD-10-CM

## 2015-12-29 ENCOUNTER — Ambulatory Visit
Admission: RE | Admit: 2015-12-29 | Discharge: 2015-12-29 | Disposition: A | Discharge status: Home / Self Care | Payer: Commercial Managed Care - HMO | Source: Ambulatory Visit | Attending: Family Medicine | Admitting: Family Medicine

## 2015-12-29 ENCOUNTER — Other Ambulatory Visit: Payer: Self-pay | Admitting: Family Medicine

## 2015-12-29 DIAGNOSIS — R928 Other abnormal and inconclusive findings on diagnostic imaging of breast: Secondary | ICD-10-CM

## 2016-01-02 ENCOUNTER — Ambulatory Visit
Admission: RE | Admit: 2016-01-02 | Discharge: 2016-01-02 | Disposition: A | Discharge status: Home / Self Care | Payer: Commercial Managed Care - HMO | Source: Ambulatory Visit | Attending: Family Medicine | Admitting: Family Medicine

## 2016-01-02 ENCOUNTER — Other Ambulatory Visit: Payer: Self-pay | Admitting: Family Medicine

## 2016-01-02 DIAGNOSIS — R928 Other abnormal and inconclusive findings on diagnostic imaging of breast: Secondary | ICD-10-CM

## 2016-01-02 HISTORY — PX: BREAST BIOPSY: SHX20

## 2016-01-07 ENCOUNTER — Telehealth: Payer: Self-pay | Admitting: *Deleted

## 2016-01-07 DIAGNOSIS — Z170 Estrogen receptor positive status [ER+]: Secondary | ICD-10-CM

## 2016-01-07 DIAGNOSIS — C50311 Malignant neoplasm of lower-inner quadrant of right female breast: Secondary | ICD-10-CM | POA: Insufficient documentation

## 2016-01-07 NOTE — Telephone Encounter (Signed)
Confirmed [facility] for 01/14/16 at 815am .  Instructions and contact information given.

## 2016-01-07 NOTE — Telephone Encounter (Signed)
Received appt date/time from [facility].  Mailed clinic packet to pt.

## 2016-01-12 ENCOUNTER — Encounter: Payer: Self-pay | Admitting: Family Medicine

## 2016-01-13 NOTE — Progress Notes (Signed)
Krystal Brooks  [phone] Fax:([phone]) [place] Note   Patient Care Team: Susy Frizzle, MD as PCP - General (Family Medicine) Truitt Merle, MD as Consulting Physician (Oncology) Excell Seltzer, MD as Consulting Physician (General Surgery) Thea Silversmith, MD as Consulting Physician (Radiation Oncology) 01/14/2016  CHIEF COMPLAINTS/PURPOSE OF CONSULTATION:  Newly diagnosed right breast cancer   Oncology History   Breast cancer of lower-inner quadrant of right female breast [facility])   Staging form: Breast, AJCC 7th Edition     Clinical stage from 01/02/2016: Stage IA (T1a, N0, M0) - Signed by Truitt Merle, MD on 01/14/2016         Breast cancer of lower-inner quadrant of right female breast ([place])   12/29/2015 Mammogram Diagnostic right mammogram showed a 2.3 x 2.1 x 2.2 cm group of coarse calcification within the medial slightly lower right breast.   01/02/2016 Initial Diagnosis Breast cancer of lower-inner quadrant of right female breast ([place])   01/02/2016 Initial Biopsy right breast LIQ core needle biopsy showed lobular carcinoma in situ, with a small focus of invasive lobular carcinoma, grade 2.    01/02/2016 Receptors her2 He had 10% positive, moderate staining. PR negative. Insufficient tissue for HER-2 testing.     HISTORY OF PRESENTING ILLNESS:  Krystal Brooks 60 y.o. female is here because of Her newly diagnosed right breast cancer. She is accompanied by her husband and of friend to our multidisciplinary rest clinic today.  This was found by screening mammogram,  she denies any palpable mass, skin change or nipple discharge, or ulcer constitutional symptoms before the screening.  Her prior mammo 6 years ago. She had a right breast cyst removed in 1979.   She has hypertension, mild asthma, otherwise pretty healthy. She feels very well, no symptoms. She lives with her husband, her daughter, son-in-law and their 19-year-old branch regimen. She  takes of her current child at home. She does not exercise regularly, but is very physically active. No family history of breast cancer or offering cancer.  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 Allergy   . Asthma   . Hypertension   . Smoker   . Atherosclerosis of aorta (HCC)     bulge above renal arteries on mri 2016  . Anxiety     SURGICAL HISTORY: Past Surgical History  Procedure Laterality Date  . Cesarean section    . Abdominal hysterectomy      SOCIAL HISTORY: Social History   Social History  . Marital Status: Married    Spouse Name: N/A  . Number of Children: N/A  . Years of Education: N/A   Occupational History  . Not on file.   Social History Main Topics  . Smoking status: Current Every Day Smoker -- 1.00 packs/day for 40 years    Types: Cigarettes  . Smokeless tobacco: Never Used  . Alcohol Use: 0.0 oz/week    0 Standard drinks or equivalent per week     Comment: occa  . Drug Use: No  . Sexual Activity: Yes    Birth Control/ Protection: Surgical     Comment: hysterectomy   Other Topics Concern  . Not on file   Social History Narrative   She is a retired Customer service manager  FAMILY HISTORY: Family  History  Problem Relation Age of Onset  . Hearing loss Mother   . Asthma Father   . Cancer Father     melanoma, metastases   . Diabetes Father   . Heart disease Father   . Early death Sister   . Early death Paternal Grandfather   . Cancer Maternal Grandmother 88    colon cancer     ALLERGIES:  is allergic to penicillins and sulfa antibiotics.  MEDICATIONS:  Current Outpatient Prescriptions  Medication Sig Dispense Refill  . albuterol (VENTOLIN HFA) 108 (90 BASE) MCG/ACT inhaler USE 1-2 PUFFS EVERY 6 HOURS AS NEEDED FOR WHEEZING 18 Inhaler 3  . amLODipine  (NORVASC) 10 MG tablet Take 1 tablet (10 mg total) by mouth daily. 90 tablet 3  . losartan-hydrochlorothiazide (HYZAAR) 50-12.5 MG tablet Take 2 tablets by mouth daily. 60 tablet 2  . montelukast (SINGULAIR) 10 MG tablet TAKE 1 TABLET BY MOUTH EVERY DAY 90 tablet 1  . ondansetron (ZOFRAN) 4 MG tablet Take 1 tablet (4 mg total) by mouth 4 (four) times daily as needed for nausea or vomiting. 20 tablet 0  . Umeclidinium-Vilanterol (ANORO ELLIPTA) 62.5-25 MCG/INH AEPB Inhale 1 puff into the lungs daily. 1 each 11   No current facility-administered medications for this visit.    REVIEW OF SYSTEMS:   Constitutional: Denies fevers, chills or abnormal night sweats Eyes: Denies blurriness of vision, double vision or watery eyes Ears, nose, mouth, throat, and face: Denies mucositis or sore throat Respiratory: Denies cough, dyspnea or wheezes Cardiovascular: Denies palpitation, chest discomfort or lower extremity swelling Gastrointestinal:  Denies nausea, heartburn or change in bowel habits Skin: Denies abnormal skin rashes Lymphatics: Denies new lymphadenopathy or easy bruising Neurological:Denies numbness, tingling or new weaknesses Behavioral/Psych: Mood is stable, no new changes  All other systems were reviewed with the patient and are negative.  PHYSICAL EXAMINATION: ECOG PERFORMANCE STATUS: 0 - Asymptomatic  Filed Vitals:   01/14/16 0904  BP: 161/76  Pulse: 98  Temp: 98.4 F (36.9 C)  Resp: 18   Filed Weights   01/14/16 0904  Weight: 134 lb 14.4 oz (61.19 kg)    GENERAL:alert, no distress and comfortable SKIN: skin color, texture, turgor are normal, no rashes or significant lesions EYES: normal, conjunctiva are pink and non-injected, sclera clear OROPHARYNX:no exudate, no erythema and lips, buccal mucosa, and tongue normal  NECK: supple, thyroid normal size, non-tender, without nodularity LYMPH:  no palpable lymphadenopathy in the cervical, axillary or inguinal LUNGS: clear to  auscultation and percussion with normal breathing effort HEART: regular rate & rhythm and no murmurs and no lower extremity edema ABDOMEN:abdomen soft, non-tender and normal bowel sounds Musculoskeletal:no cyanosis of digits and no clubbing  PSYCH: alert & oriented x 3 with fluent speech NEURO: no focal motor/sensory deficits  LABORATORY DATA:  I have reviewed the data as listed Lab Results  Component Value Date   WBC 6.5 01/14/2016   HGB 12.8 01/14/2016   HCT 37.8 01/14/2016   MCV 86.8 01/14/2016   PLT 188 01/14/2016    Recent Labs  02/17/15 0814 01/14/16 0844  NA 134* 133*  K 4.2 3.4*  CL 95*  --   CO2 28 31*  GLUCOSE 108* 105  BUN 11 13.9  CREATININE 0.57 0.8  CALCIUM 9.3 9.6  GFRNONAA >89  --   GFRAA >89  --   PROT 6.8 7.4  ALBUMIN 4.2 3.8  AST 16 17  ALT 16 22  ALKPHOS 53 59  BILITOT 0.5 0.35  PATHOLOGY REPORT  Diagnosis 01/02/2016 Breast, right, needle core biopsy, lower, inner quadrant - INVASIVE MAMMARY CARCINOMA - 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 - SEE COMMENT. Microscopic Comment A smooth muscle myosin and calponin stain are performed on two blocks (4 stains total). A cytokeratin AE1/AE3 stain is also performed on a single block. The morphology coupled with the staining pattern is consistent with a very small focus of invasive mammary carcinoma in a background of mammary carcinoma in situ. An E-cadherin immunohistochemical stain will be performed to determine whether the tumor is ductal or lobular in nature (i.e. invasive ductal carcinoma with high grade ductal carcinoma in situ or pleomorphic invasive lobular carcinoma with lobular carcinoma in situ). The results will be reported in an addendum to follow. Although definitive grading of breast carcinoma is best done on excision, the features of the invasive tumor from the right lower inner quadrant breast needle core biopsy are compatible with a grade 2 breast carcinoma. A  breast prognostic markers will be attempted (although the focus of invasive tumor is very small). The results of the profile will also be reported in an addendum to follow. Findings are called to the [facility] on 01/06/16. Dr. Lyndon Code has seen this case in consultation with agreement. (RAH:gt, 01/06/16)  ADDENDUM: An E-cadherin immunohistochemical stain is performed. 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 Dr. Lyndon Code has seen the E-cadherin immunohistochemical stain in consultation with agreement of this assessment.  Results: IMMUNOHISTOCHEMICAL AND MORPHOMETRIC ANALYSIS PERFORMED MANUALLY Estrogen Receptor: 10%, POSITIVE, MODERATE STAINING INTENSITY Progesterone Receptor: 0%, NEGATIVE Proliferation Marker Ki67: 10%  Results: HER2 - INSUFFICIENT: Not enough tumor cells for HER2 Analysis.  RADIOGRAPHIC STUDIES: I have personally reviewed the radiological images as listed and agreed with the findings in the report. Mm Digital Diagnostic Unilat R  01/02/2016  CLINICAL DATA:  Status post stereotactic core needle biopsy of right breast calcifications EXAM: DIAGNOSTIC RIGHT MAMMOGRAM POST STEREOTACTIC BIOPSY COMPARISON:  Previous exam(s). FINDINGS: Mammographic images were obtained following stereotactic guided biopsy of right breast calcifications. The coil shaped biopsy clip lies 18 mm medial to the remaining calcifications on the CC view. It is well aligned on the MLO view. IMPRESSION: Coil shaped biopsy clip has migrated medially by 18 mm on the right breast calcifications on the compressed CC view. Final Assessment: Post Procedure Mammograms for Marker Placement Electronically Signed   By: Lajean Manes M.D.   On: 01/02/2016 10:48   Mm Digital Diagnostic Unilat R  12/29/2015  CLINICAL DATA:  Callback from screening mammogram for right breast calcifications EXAM: DIGITAL  DIAGNOSTIC RIGHT MAMMOGRAM WITH CAD COMPARISON:  Previous exam(s). ACR Breast Density Category c: The breast tissue is heterogeneously dense, which may obscure small masses. FINDINGS: Magnification and full lateral views of the right breast were performed. On the additional views there is a 2.3 x 2.1 x 2.2 cm group of coarse heterogeneous calcifications within the medial, slightly lower right breast, corresponding to the finding seen on screening mammogram. Mammographic images were processed with CAD. IMPRESSION: Suspicious right breast calcifications. RECOMMENDATION: Stereotactic guided right breast biopsy. I have discussed the findings and recommendations with the patient. Results were also provided in writing at the conclusion of the visit. If applicable, a reminder letter will be sent to the patient regarding the next appointment. BI-RADS CATEGORY  4: Suspicious abnormality - biopsy should be considered. Electronically Signed   By:  Pamelia Hoit M.D.   On: 12/29/2015 10:47   Mm Screening Breast Tomo Bilateral  12/19/2015  CLINICAL DATA:  Screening. EXAM: 2D DIGITAL SCREENING BILATERAL MAMMOGRAM WITH CAD AND ADJUNCT TOMO COMPARISON:  Previous exam(s). ACR Breast Density Category c: The breast tissue is heterogeneously dense, which may obscure small masses. FINDINGS: In the right breast, calcifications warrant further evaluation with magnified views. In the left breast, no findings suspicious for malignancy. Images were processed with CAD. IMPRESSION: Further evaluation is suggested for calcifications in the right breast. RECOMMENDATION: Diagnostic mammogram of the right breast. (Code:[id number]) The patient will be contacted regarding the findings, and additional imaging will be scheduled. BI-RADS CATEGORY  0: Incomplete. Need additional imaging evaluation and/or prior mammograms for comparison. Electronically Signed   By: Lajean Manes M.D.   On: 12/19/2015 13:46   Mm Rt Breast Bx W Loc Dev 1st Lesion Image Bx  Spec Stereo Guide  01/06/2016  ADDENDUM REPORT: 01/06/2016 14:24 ADDENDUM: Pathology revealed GRADE II INVASIVE MAMMARY CARCINOMA, MAMMARY CARCINOMA IN SITU WITH NECROSIS AND CALCIFICATIONS of the lower inner quadrant of the Right breast. This was found to be concordant by Dr. Lajean Manes. Pathology results were discussed with the patient by telephone. The patient reported doing well after the biopsy. Post biopsy instructions and care were reviewed and questions were answered. The patient was encouraged to call [facility] for any additional concerns. The patient was referred to [facility] on Jan 14, 2016. Pathology results reported by Terie Purser, RN on 01/06/2016. Electronically Signed   By: Lajean Manes M.D.   On: 01/06/2016 14:24  01/06/2016  CLINICAL DATA:  Patient presents for stereotactic core needle biopsy of right breast calcifications. EXAM: RIGHT BREAST STEREOTACTIC CORE NEEDLE BIOPSY COMPARISON:  Previous exams. FINDINGS: The patient and I discussed the procedure of stereotactic-guided biopsy including benefits and alternatives. We discussed the high likelihood of a successful procedure. We discussed the risks of the procedure including infection, bleeding, tissue injury, clip migration, and inadequate sampling. Informed written consent was given. The usual time out protocol was performed immediately prior to the procedure. Using sterile technique and 1% Lidocaine as local anesthetic, under stereotactic guidance, a 9 gauge vacuum assist device was used to perform core needle biopsy of calcifications in the lower inner quadrant of the right breast using a medial to lateral of the approach. Specimen radiograph was performed showing calcifications for which biopsy was performed. Specimens with calcifications are identified for pathology. At the conclusion of the procedure, a coil shaped tissue marker  clip was deployed into the biopsy cavity. Follow-up 2-view mammogram was performed and dictated separately. IMPRESSION: Stereotactic-guided biopsy of right breast calcifications. No apparent complications. Electronically Signed: By: Lajean Manes M.D. 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 .  1. Right lower inner quadrant breast cancer, small focus of invasive lobular carcinoma, in the background of LCIS.  ER10%+, PR- -I discussed her breast imaging and needle biopsy results with patient and her family members in great detail. -She is a candidate for breast conservation surgery. She has been seen by breast surgeon Dr. Pablo Ledger who recommends lumpectomy and SLN biopsy.  -We discussed her that LCIS is a precancerous lesion, need to be surgically removed. She was found to have a very small focus of invasive lobular carcinoma on the  biopsy, this is likely early-stage breast cancer. -We will review his final surgical pathology results, if the invasive component is more than 5 mm with high-grade, or more than 10 minute meter with grade 1, we'll consider Oncotype to further evaluate the risks of recurrence and benefit of chemotherapy. -Adjuvant hormonal therapy can be considered, however the benefit is probably low given the weak ER positivity. -She will be seen by a radiation oncologist Dr. Pablo Ledger today, to discuss adjuvant breast irradiation. -I'll plan to see her back after her breast surgery. -We discussed breast cancer surveillance after she completes treatment, Including annual mammogram, breast exam every 6-12 months.  Plan -She will likely have lumpectomy soon -will decide if Oncotype is needed based on her final surgical path (>74m if high grade, or >132mif G1), if node negative -I'll see her 2-3 weeks after her surgery to review her pathology result and finalize  her adjuvant therapy.     Orders Placed This Encounter  Procedures  . [employer]    AXILL[place] ONLY U/S PF:  12/19/15, 01/02/16   NO NEEDS/ EPIC ORDER INS: [employer]  JTB/KEISHA     Standing Status: Future     Number of Occurrences:      Standing Expiration Date: 03/15/2017    Order Specific Question:  Reason for Exam (SYMPTOM  OR DIAGNOSIS REQUIRED)    Answer:  new breast cancer    Order Specific Question:  [facility]    [facility] W LOC DEV 1ST LESION IMAGE BX SPEC STEREO GUIDE    Standing Status: Future     Number of Occurrences:      Standing Expiration Date: 03/15/2017    Order Specific Question:  Reason for Exam (SYMPTOM  OR DIAGNOSIS REQUIRED)    Answer:  new breast cancer    Order Specific Question:  [facility]    [facility] [place] BX W LOC DEV 1ST LESION IMG BX SPEC [place]IDE    Standing Status: Future     Number of Occurrences:      Standing Expiration Date: 03/15/2017    Order Specific Question:  Reason for Exam (SYMPTOM  OR DIAGNOSIS REQUIRED)    Answer:  new breast cancer    Order Specific Question:  [facility]    [facility]  All questions were answered. The patient knows to call the clinic with any problems, questions or concerns. I spent 55 minutes counseling the patient face to face. The total time spent in the appointment was 60 minutes and more than 50% was on counseling.     FeTruitt MerleMD 01/14/2016 9:51 AM

## 2016-01-14 ENCOUNTER — Encounter: Payer: Self-pay | Admitting: Hematology

## 2016-01-14 ENCOUNTER — Ambulatory Visit
Admission: RE | Admit: 2016-01-14 | Discharge: 2016-01-14 | Disposition: A | Discharge status: Home / Self Care | Payer: Commercial Managed Care - HMO | Source: Ambulatory Visit | Attending: Radiation Oncology | Admitting: Radiation Oncology

## 2016-01-14 ENCOUNTER — Other Ambulatory Visit (HOSPITAL_BASED_OUTPATIENT_CLINIC_OR_DEPARTMENT_OTHER): Payer: Commercial Managed Care - HMO

## 2016-01-14 ENCOUNTER — Other Ambulatory Visit: Payer: Self-pay | Admitting: General Surgery

## 2016-01-14 ENCOUNTER — Ambulatory Visit: Payer: Commercial Managed Care - HMO | Attending: General Surgery | Admitting: Physical Therapy

## 2016-01-14 ENCOUNTER — Encounter: Payer: Self-pay | Admitting: *Deleted

## 2016-01-14 ENCOUNTER — Encounter: Payer: Self-pay | Admitting: Skilled Nursing Facility1

## 2016-01-14 ENCOUNTER — Ambulatory Visit (HOSPITAL_BASED_OUTPATIENT_CLINIC_OR_DEPARTMENT_OTHER): Payer: Commercial Managed Care - HMO | Admitting: Hematology

## 2016-01-14 ENCOUNTER — Encounter: Payer: Self-pay | Admitting: Physical Therapy

## 2016-01-14 VITALS — BP 161/76 | HR 98 | Temp 98.4°F | Resp 18 | Ht 61.0 in | Wt 134.9 lb

## 2016-01-14 DIAGNOSIS — Z170 Estrogen receptor positive status [ER+]: Secondary | ICD-10-CM

## 2016-01-14 DIAGNOSIS — C50311 Malignant neoplasm of lower-inner quadrant of right female breast: Secondary | ICD-10-CM

## 2016-01-14 DIAGNOSIS — I10 Essential (primary) hypertension: Secondary | ICD-10-CM | POA: Diagnosis not present

## 2016-01-14 DIAGNOSIS — Z720 Tobacco use: Secondary | ICD-10-CM

## 2016-01-14 DIAGNOSIS — R293 Abnormal posture: Secondary | ICD-10-CM | POA: Insufficient documentation

## 2016-01-14 DIAGNOSIS — C50911 Malignant neoplasm of unspecified site of right female breast: Secondary | ICD-10-CM

## 2016-01-14 LAB — CBC WITH DIFFERENTIAL/PLATELET
BASO%: 0.8 % (ref 0.0–2.0)
BASOS ABS: 0.1 10*3/uL (ref 0.0–0.1)
EOS ABS: 0.1 10*3/uL (ref 0.0–0.5)
EOS%: 0.8 % (ref 0.0–7.0)
HCT: 37.8 % (ref 34.8–46.6)
HGB: 12.8 g/dL (ref 11.6–15.9)
LYMPH%: 21.3 % (ref 14.0–49.7)
MCH: 29.3 pg (ref 25.1–34.0)
MCHC: 33.8 g/dL (ref 31.5–36.0)
MCV: 86.8 fL (ref 79.5–101.0)
MONO#: 0.4 10*3/uL (ref 0.1–0.9)
MONO%: 6.4 % (ref 0.0–14.0)
NEUT#: 4.6 10*3/uL (ref 1.5–6.5)
NEUT%: 70.7 % (ref 38.4–76.8)
PLATELETS: 188 10*3/uL (ref 145–400)
RBC: 4.36 10*6/uL (ref 3.70–5.45)
RDW: 14.5 % (ref 11.2–14.5)
WBC: 6.5 10*3/uL (ref 3.9–10.3)
lymph#: 1.4 10*3/uL (ref 0.9–3.3)

## 2016-01-14 LAB — COMPREHENSIVE METABOLIC PANEL
ALT: 22 U/L (ref 0–55)
ANION GAP: 8 meq/L (ref 3–11)
AST: 17 U/L (ref 5–34)
Albumin: 3.8 g/dL (ref 3.5–5.0)
Alkaline Phosphatase: 59 U/L (ref 40–150)
BUN: 13.9 mg/dL (ref 7.0–26.0)
CHLORIDE: 95 meq/L — AB (ref 98–109)
CO2: 31 meq/L — AB (ref 22–29)
Calcium: 9.6 mg/dL (ref 8.4–10.4)
Creatinine: 0.8 mg/dL (ref 0.6–1.1)
EGFR: 86 mL/min/{1.73_m2} — AB (ref >90)
Glucose: 105 mg/dl (ref 70–140)
Potassium: 3.4 mEq/L — ABNORMAL LOW (ref 3.5–5.1)
Sodium: 133 mEq/L — ABNORMAL LOW (ref 136–145)
Total Bilirubin: 0.35 mg/dL (ref 0.20–1.20)
Total Protein: 7.4 g/dL (ref 6.4–8.3)

## 2016-01-14 NOTE — Addendum Note (Signed)
Addended by: Jesse Fall on: 01/14/2016 10:28 AM   Modules accepted: Medications

## 2016-01-14 NOTE — Patient Instructions (Signed)

## 2016-01-14 NOTE — Progress Notes (Signed)
[facility] Psychosocial Distress Screening Clinical Social Work  Clinical Social Work met with pt, her husband, Clair Gulling and pt's friend today at Breast Clinic to introduce self, explain role of Clinical Social Work/Pt and Family Support team and to review distress screening protocol.  The patient scored a 4 on the Psychosocial Distress Thermometer which indicates mild distress. Pt shared she was even less distressed than prior to meeting with the medical team as she is relieved by her current treatment plan. Pt shared she is able to work through her anxiety independently. CSW reviewed common emotions and coping due to cancer diagnosis. CSW reviewed support programs that are available to pt and family to assist. Pt and family denied other needs and agree to reach out as needed.   ONCBCN DISTRESS SCREENING 01/14/2016  Screening Type Initial Screening  Distress experienced in past week (1-10) 4  Emotional problem type Nervousness/Anxiety;Adjusting to illness  Referral to support programs Yes    Clinical Social Worker follow up needed: No.  If yes, follow up plan:  Loren Racer, [place]  [facility] Phone: [phone] Fax: [phone]

## 2016-01-14 NOTE — Progress Notes (Signed)
Subjective:     Patient ID: Krystal Brooks, female   DOB: 1956-06-21, 60 y.o.   MRN: [id number]  HPI   Review of Systems     Objective:   Physical Exam For the patient to understand and be given the tools to implement a healthy plant based diet during their cancer diagnosis.     Assessment:     Patient was seen today and found to be pleasant and accompanied by her family. Pts labs: Sodium 133, Potassium 3.4, chloride 95, CO2 31. Ht 61'', wt 134 pounds, BMI 25.5. Pt states she is very active running around with her 61 year old Curator. Pt was actively listening while her sister took notes.       Plan:     Dietitian educated the patient on implementing a plant based diet by incorporating more plant proteins, fruits, and vegetables. As a part of a healthy routine physical activity was discussed. The importance of legitimate, evidence based information was discussed and examples were given. A folder of evidence based information with a focus on a plant based diet and general nutrition during cancer was given to the patient.  As a part of the continuum of care the cancer dietitian's contact information was given to the patient in the event they would like to have a follow up appointment.

## 2016-01-14 NOTE — Progress Notes (Signed)
Radiation Oncology         [phone]) [phone] ________________________________  Initial Outpatient Consultation - Date: 01/14/2016   Name: Krystal Brooks MRN: [id number]   DOB: [birth year]  REFERRING PHYSICIAN: Excell Seltzer, MD  DIAGNOSIS AND STAGE: Breast cancer of lower-inner quadrant of right female breast [facility])   Staging form: Breast, AJCC 7th Edition     Clinical stage from 01/02/2016: Stage IIA (T2, N0, M0) - Signed by Truitt Merle, MD on 01/13/2016   HISTORY OF PRESENT ILLNESS::Krystal Brooks is a 60 y.o. female who had a screening mammogram on 12/19/15. This noted calcifications in the lower inner quadrant of the right breast warranting further evaluation. Diagnostic mammogram on 12/29/15 noted a 2.3 x 2.1 x 2.2 cm group of coarse heterogeneous calcifications corresponding to what was seen on the screening mammogram.  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 The patient presents to breast clinic today to discuss the role of radiation in the management of her disease.she is accompanied by her husband and good friend.    PREVIOUS RADIATION THERAPY: No  Past medical, social and family history were reviewed in the electronic chart. Review of symptoms was reviewed in the electronic chart. Medications were reviewed in the electronic chart.   Gynecologic History  Age at first menstrual period? 11  Are you still having periods? No Approximate date of last period? 1990  If you no longer have periods: Have you used hormone replacement? Yes  If YES, for how long? 1 year When did you stop? 8 years ago Obstetric History:  How many children have you carried to term? 2 Your age at first live birth? 68  Pregnant now or trying to get pregnant? No  Have you used birth control pills or hormone shots for contraception?  Yes  If so, for how long (or approximate dates)? [id number]  Would you be interested in learning more about the options to preserve fertility? No Health Maintenance:  Have you ever had a colonoscopy? No  Have you ever had a bone density? Yes If yes, date? ?  Date of your last PAP smear? 5 years Date of your FIRST mammogram? 42?  ROS: The patient complain of right sided back pain, sinus problems, sleeps on more than 1 pillow, a productive cough, numbness, and anxiety.  PHYSICAL EXAM: There were no vitals filed for this visit.. . Vitals with BMI 01/14/2016  Height '5\' 1"'$   Weight 134 lbs 14 oz  BMI 76.7  Systolic 341  Diastolic 76  Pulse 98  Respirations 18   Biopsy change in the lower outer quadrant of the right breast. No palpable abnormalities of the left breast. No palpable cervical, supraclavicular, or axillary adenopathy.   IMPRESSION: Stage IIA invasive lobular carcinoma of the right breast  PLAN: The patient will undergo a right axillary ultrasound and then a right lumpectomy and sentinel lymph node biopsy. Oncotype would be ordered if the tumor is over 5 mm in size. I expect to only give radiation if there is more than microscopic invasion seen on the final specimen.   In anticipation of possible radiation, we discussed the equivalence in terms of survival and local failure between mastectomy and breast conservation. We discussed the role of radiation in decreasing local failures in patients who undergo lumpectomy. We discussed the process of simulation  and the placement tattoos. We discussed 4 weeks of treatment as an outpatient. We discussed the possibility of asymptomatic lung damage. We discussed the low likelihood of secondary malignancies. We discussed the possible side effects including but not limited to skin redness, fatigue, permanent skin darkening, and breast swelling. We discussed the process of simulation and the placement of tattoosShe met with medical oncology as well as  a member of our patient family support team and our physical therapist. I will plan on seeing her back after her surgery.  I spent 40 minutes  face to face with the patient and more than 50% of that time was spent in counseling and/or coordination of care.   ------------------------------------------------  Thea Silversmith, MD  This document serves as a record of services personally performed by Thea Silversmith, MD. It was created on her behalf by Darcus Austin, a trained medical scribe. The creation of this record is based on the scribe's personal observations and the provider's statements to them. This document has been checked and approved by the attending provider.

## 2016-01-14 NOTE — Therapy (Signed)
[place], [place], [address] Phone: [phone]   Fax:  [phone]  Physical Therapy Evaluation  Patient Details  Name: Krystal Brooks MRN: [id number] Date of Birth: 09/11/1955 Referring Provider: Dr. Excell Seltzer  Encounter Date: 01/14/2016      PT End of Session - 01/14/16 1136    Visit Number 1   Number of Visits 1   PT Start Time 1050   PT Stop Time 1125   PT Time Calculation (min) 35 min   Activity Tolerance Patient tolerated treatment well   Behavior During Therapy [facility] for tasks assessed/performed      Past Medical History  Diagnosis Date  . Allergy   . Asthma   . Hypertension   . Smoker   . Atherosclerosis of aorta (HCC)     bulge above renal arteries on mri 2016  . Anxiety     Past Surgical History  Procedure Laterality Date  . Cesarean section    . Abdominal hysterectomy      There were no vitals filed for this visit.       Subjective Assessment - 01/14/16 1137    Subjective Patient reports she is here today to be seen by her multidisciplinary medical team for her newly diagnosed right breast cancer.   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  It is located in the lower inner quadrant and measures 2.3 cm.  There is also microinvasive lobular carcinoma.  It is ER positive and PR negative and HER2 had insufficient tissue.  Her multidisciplinary medical team met in conference prior to her evaluation to determine a recommended treatment plan based on her cancer characteristics.   Patient Stated Goals Reduce lymphedema risk and learn post op shoulder ROM HEP            [facility] PT Assessment - 01/14/16 0001    Assessment   Medical Diagnosis Right breast cancer   Referring Provider Dr. Excell Seltzer   Onset Date/Surgical Date 12/19/15   Hand Dominance Right   Prior Therapy none   Precautions   Precautions Other (comment)   Precaution Comments Active breast cancer   Restrictions   Weight Bearing Restrictions No   Balance Screen   Has the patient fallen in the past 6 months No   Has the patient had a decrease in activity level because of a fear of falling?  No   Is the patient reluctant to leave their home because of a fear of falling?  No   Home Ecologist residence   Living Arrangements Spouse/significant other  Daughter, son-in-law, and 45 y.o. 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   Leisure She does not exercise   Cognition   Overall Cognitive Status Within Functional Limits for tasks assessed   Posture/Postural Control   Posture/Postural Control Postural limitations   Postural Limitations Rounded Shoulders;Forward head  With right shoulder sitting anterior   ROM / Strength   AROM / PROM / Strength AROM;Strength   AROM   AROM Assessment Site Shoulder   Right/Left Shoulder Right;Left   Right Shoulder Extension 45 Degrees   Right Shoulder Flexion 142 Degrees   Right Shoulder ABduction 158 Degrees   Right Shoulder Internal Rotation 82 Degrees  Right Shoulder External Rotation 72 Degrees   Left Shoulder Extension 46 Degrees   Left Shoulder Flexion 151 Degrees   Left Shoulder ABduction 160 Degrees   Left Shoulder Internal Rotation 79 Degrees   Left Shoulder External Rotation 82 Degrees   Strength   Overall Strength Within functional limits for tasks performed           LYMPHEDEMA/ONCOLOGY QUESTIONNAIRE - 01/14/16 1131    Type   Cancer Type Right breast cancer   Lymphedema Assessments   Lymphedema Assessments Upper extremities   Right Upper Extremity Lymphedema   10 cm Proximal to Olecranon Process 25 cm   Olecranon Process 20.6 cm   10 cm Proximal to Ulnar  Styloid Process 19.8 cm   Just Proximal to Ulnar Styloid Process 14 cm   Across Hand at [employer] 17.2 cm   At [address] of 2nd Digit 5.7 cm   Left Upper Extremity Lymphedema   10 cm Proximal to Olecranon Process 25.2 cm   Olecranon Process 21.2 cm   10 cm Proximal to Ulnar Styloid Process 19.2 cm   Just Proximal to Ulnar Styloid Process 13.9 cm   Across Hand at [employer] 16.9 cm   At [place] of 2nd Digit 5.7 cm      Patient was instructed today in a home exercise program today for post op shoulder range of motion. These included active assist shoulder flexion in sitting, scapular retraction, wall walking with shoulder abduction, and hands behind head external rotation.  She was encouraged to do these twice a day, holding 3 seconds and repeating 5 times when permitted by her physician.         PT Education - 01/14/16 1135    Education provided Yes   Education Details post op shoulder ROM HEP and lymphedema risk reduction   Person(s) Educated Patient;Spouse   Methods Explanation;Demonstration;Handout   Comprehension Returned demonstration;Verbalized understanding              Breast Clinic Goals - 01/14/16 1142    Patient will be able to verbalize understanding of pertinent lymphedema risk reduction practices relevant to her diagnosis specifically related to skin care.   Time 1   Period Days   Status Achieved   Patient will be able to return demonstrate and/or verbalize understanding of the post-op home exercise program related to regaining shoulder range of motion.   Time 1   Period Days   Status Achieved   Patient will be able to verbalize understanding of the importance of attending the postoperative After Breast Cancer Class for further lymphedema risk reduction education and therapeutic exercise.   Time 1   Period Days   Status Achieved              Plan - 01/14/16 1137    Clinical Impression Statement Patient was diagnosed on 12/19/15 with right  lobular carcinoma in situ breast cancer.  It is located in the lower inner quadrant and measures 2.3 cm.  There is also microinvasive lobular carcinoma.  It is ER positive and PR negative and HER2 had insufficient tissue.  Her multidisciplinary medical team met in conference prior to her evaluation to determine a recommended treatment plan based on her cancer characteristics.  She is planning to have a right lumpectomy and sentinel node biopsy followed by Oncotype testing and possible radiation depending on final pathology.  She may benefit from post op PT to regain shoulder ROM and reduce lymphedema risk. Due to her lack  of comorbidities, being retired with good support at home, and her condition being evolving, she is a low complexity evaluation.   Rehab Potential Excellent   Clinical Impairments Affecting Rehab Potential none   PT Frequency One time visit   PT Treatment/Interventions Therapeutic exercise;Patient/family education   PT Next Visit Plan Will f/u after surgery to determine needs   PT Home Exercise Plan Post op shoulder ROM HEP   Consulted and Agree with Plan of Care Patient;Family member/caregiver   Family Member Consulted Husband and family friend      Patient will benefit from skilled therapeutic intervention in order to improve the following deficits and impairments:  Decreased strength, Pain, Decreased knowledge of precautions, Impaired UE functional use, Decreased range of motion  Visit Diagnosis: Carcinoma of lower-inner quadrant of right breast ([place]) - Plan: PT plan of care cert/re-cert  Abnormal posture - Plan: PT plan of care cert/re-cert   Patient will follow up at outpatient cancer rehab if needed following surgery.  If the patient requires physical therapy at that time, a specific plan will be dictated and sent to the referring physician for approval. The patient was educated today on appropriate basic range of motion exercises to begin post operatively and the  importance of attending the After Breast Cancer class following surgery.  Patient was educated today on lymphedema risk reduction practices as it pertains to recommendations that will benefit the patient immediately following surgery.  She verbalized good understanding.  No additional physical therapy is indicated at this time.     Problem List Patient Active Problem List   Diagnosis Date Noted  . Breast cancer of lower-inner quadrant of right female breast ([place]) 01/07/2016  . Atherosclerosis of aorta ([place])   . Smoker     Annia Friendly, [place] 01/14/2016 11:46 AM  [place], [place], [address] Phone: [phone]   Fax:  [phone]  Name: Krystal Brooks MRN: [id number] Date of Birth: 07/14/1956

## 2016-01-15 ENCOUNTER — Encounter: Payer: Self-pay | Admitting: Nurse Practitioner

## 2016-01-15 DIAGNOSIS — C50311 Malignant neoplasm of lower-inner quadrant of right female breast: Secondary | ICD-10-CM

## 2016-01-15 NOTE — Progress Notes (Signed)
Ms. Zachman is a very pleasant 60 y.o. 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  Biopsy results revealed the tumor's prognostic profile is ER positive, PR negative, and HER2/neu insufficent tissue to test. Ki67 is 10%.  She presents today with her husband and friend to the [facility]) for treatment consideration and recommendations from the breast surgeon, radiation oncologist, and medical oncologist.     I briefly met with Ms. Tlatelpa and her husband and friend during her [facility] visit today. We discussed the purpose of the Survivorship Clinic, which will include monitoring for recurrence, coordinating completion of age and gender-appropriate cancer screenings, promotion of overall wellness, as well as managing potential late/long-term side effects of anti-cancer treatments.    The treatment plan for Ms. Druckenmiller will likely include surgery, radiation therapy, and anti-estrogen therapy.  As of today, the intent of treatment for Ms. Aldama is cure, therefore she will be eligible for the Survivorship Clinic upon her completion of treatment.  Her survivorship care plan (SCP) document will be drafted and updated throughout the course of her treatment trajectory. She will receive the SCP in an office visit with myself in the Survivorship Clinic once she has completed treatment.   Ms. Hugill was encouraged to ask questions and all questions were answered to her satisfaction.  She was given my business card and encouraged to contact me with any concerns regarding survivorship.  I look forward to participating in her care.   Kenn File, [place] [phone]

## 2016-01-20 ENCOUNTER — Other Ambulatory Visit: Payer: Self-pay | Admitting: Family Medicine

## 2016-01-20 ENCOUNTER — Telehealth: Payer: Self-pay | Admitting: *Deleted

## 2016-01-20 ENCOUNTER — Ambulatory Visit
Admission: RE | Admit: 2016-01-20 | Discharge: 2016-01-20 | Disposition: A | Discharge status: Home / Self Care | Payer: Commercial Managed Care - HMO | Source: Ambulatory Visit | Attending: General Surgery | Admitting: General Surgery

## 2016-01-20 ENCOUNTER — Encounter (HOSPITAL_BASED_OUTPATIENT_CLINIC_OR_DEPARTMENT_OTHER): Payer: Self-pay | Admitting: *Deleted

## 2016-01-20 ENCOUNTER — Other Ambulatory Visit: Payer: Self-pay | Admitting: General Surgery

## 2016-01-20 DIAGNOSIS — C50911 Malignant neoplasm of unspecified site of right female breast: Secondary | ICD-10-CM

## 2016-01-20 DIAGNOSIS — C50311 Malignant neoplasm of lower-inner quadrant of right female breast: Secondary | ICD-10-CM

## 2016-01-20 MED ORDER — LOSARTAN POTASSIUM-HCTZ 50-12.5 MG PO TABS: 2.0000 | ORAL_TABLET | Freq: Every day | ORAL | Status: DC

## 2016-01-20 NOTE — Telephone Encounter (Signed)
  Oncology Nurse Navigator Documentation    Navigator Encounter Type: Telephone;MDC Follow-up (01/20/16 1200) Telephone: Outgoing Call;Clinic/MDC Follow-up (01/20/16 1200)     Surgery Date: 01/27/16 (01/20/16 1200) Treatment Initiated Date: 01/27/16 (01/20/16 1200)     Barriers/Navigation Needs: No barriers at this time;No Questions;No Needs (01/20/16 1200)   Interventions: Coordination of Care (POF placed for post-op appt with Dr. Burr Medico) (01/20/16 1200)   Coordination of Care: Appts (01/20/16 1200)                  Time Spent with Patient: 15 (01/20/16 1200)

## 2016-01-21 ENCOUNTER — Encounter (HOSPITAL_BASED_OUTPATIENT_CLINIC_OR_DEPARTMENT_OTHER)
Admission: RE | Admit: 2016-01-21 | Discharge: 2016-01-21 | Disposition: A | Discharge status: Home / Self Care | Payer: Commercial Managed Care - HMO | Source: Ambulatory Visit | Attending: General Surgery | Admitting: General Surgery

## 2016-01-21 ENCOUNTER — Telehealth: Payer: Self-pay | Admitting: Hematology

## 2016-01-21 DIAGNOSIS — Z01810 Encounter for preprocedural cardiovascular examination: Secondary | ICD-10-CM | POA: Diagnosis present

## 2016-01-21 DIAGNOSIS — I10 Essential (primary) hypertension: Secondary | ICD-10-CM | POA: Insufficient documentation

## 2016-01-21 NOTE — Pre-Procedure Instructions (Signed)
Pt in for PAT visit, given Breeze drink and instructions reviewed.

## 2016-01-21 NOTE — Telephone Encounter (Signed)
s.w. pt and advised on June appts...pt ok and aware °

## 2016-01-22 ENCOUNTER — Ambulatory Visit
Admission: RE | Admit: 2016-01-22 | Discharge: 2016-01-22 | Disposition: A | Discharge status: Home / Self Care | Payer: Commercial Managed Care - HMO | Source: Ambulatory Visit | Attending: General Surgery | Admitting: General Surgery

## 2016-01-22 DIAGNOSIS — C50911 Malignant neoplasm of unspecified site of right female breast: Secondary | ICD-10-CM

## 2016-01-26 NOTE — H&P (Signed)
History of Present Illness Marland Kitchen T. Adyen Bifulco MD; 01/14/2016 10:40 AM) The patient is a 60 year old female who presents with breast cancer. She is a 60 YO post menopausal female referred by Dr. Autumn Patty for evaluation of recently diagnosed carcinoma of the right breast. She recently presented for a screening mamogram revealing a new suspicious area of calcifications in the lower inner quadrant of the right breast. she had not had a mammogram for about 6 years. Subsequent imaging included diagnostic mamogram confirming a 2.3 cm area of coarse heterogeneous calcifications in the medial slightly lower right breast. An stereotactic guided breast biopsy was performed on `005/12/2015 with pathology revealing predominantly lobular carcinoma in situ with a small area of microinvasive lobular carcinoma of the breast. She is seen now in breast multidisciplinary clinic for initial treatment planning. She has experienced nno breast symptoms, specifically lump or pain, nipple discharge or skin changes. She does not have a personal history of a benign cyst removed from the right breast years ago.  Findings at that time were the following: Tumor size: 2.3 cm Tumor grade: 2 Estrogen Receptor: Positive 10% Progesterone Receptor: negative Her-2 neu: insufficient material Lymph node status: axillary ultrasound pending    Other Problems Conni Slipper, RN; 01/14/2016 7:46 AM) Anxiety Disorder Asthma Cervical Cancer High blood pressure Lump In Breast  Past Surgical History Conni Slipper, RN; 01/14/2016 7:46 AM) Breast Biopsy Right. Cesarean Section - Multiple Hysterectomy (not due to cancer) - Partial  Diagnostic Studies History Conni Slipper, RN; 01/14/2016 7:46 AM) Colonoscopy never Mammogram within last year Pap Smear >5 years ago  Medication History Conni Slipper, RN; 01/14/2016 7:46 AM) Medications Reconciled  Social History Conni Slipper, RN; 01/14/2016 7:46 AM) Alcohol use Moderate  alcohol use. Caffeine use Carbonated beverages, Coffee, Tea. No drug use Tobacco use Current every day smoker.  Family History Conni Slipper, RN; 01/14/2016 7:46 AM) Colon Cancer Family Members In General. Diabetes Mellitus Father. Heart Disease Father. Melanoma Father.  Pregnancy / Birth History Conni Slipper, RN; 01/14/2016 7:46 AM) Age at menarche 87 years. Age of menopause 25-50 Contraceptive History Oral contraceptives. Gravida 2 Maternal age 89-25 Para 2    Review of Systems Conni Slipper RN; 01/14/2016 7:46 AM) General Not Present- Appetite Loss, Chills, Fatigue, Fever, Night Sweats, Weight Gain and Weight Loss. Skin Not Present- Change in Wart/Mole, Dryness, Hives, Jaundice, New Lesions, Non-Healing Wounds, Rash and Ulcer. HEENT Present- Seasonal Allergies and Wears glasses/contact lenses. Not Present- Earache, Hearing Loss, Hoarseness, Nose Bleed, Oral Ulcers, Ringing in the Ears, Sinus Pain, Sore Throat, Visual Disturbances and Yellow Eyes. Respiratory Present- Wheezing. Not Present- Bloody sputum, Chronic Cough, Difficulty Breathing and Snoring. Breast Not Present- Breast Mass, Breast Pain, Nipple Discharge and Skin Changes. Cardiovascular Present- Leg Cramps. Not Present- Chest Pain, Difficulty Breathing Lying Down, Palpitations, Rapid Heart Rate, Shortness of Breath and Swelling of Extremities. Gastrointestinal Present- Excessive gas. Not Present- Abdominal Pain, Bloating, Bloody Stool, Change in Bowel Habits, Chronic diarrhea, Constipation, Difficulty Swallowing, Gets full quickly at meals, Hemorrhoids, Indigestion, Nausea, Rectal Pain and Vomiting. Female Genitourinary Not Present- Frequency, Nocturia, Painful Urination, Pelvic Pain and Urgency. Musculoskeletal Present- Joint Pain. Not Present- Back Pain, Joint Stiffness, Muscle Pain, Muscle Weakness and Swelling of Extremities. Neurological Present- Numbness. Not Present- Decreased Memory, Fainting, Headaches,  Seizures, Tingling, Tremor, Trouble walking and Weakness. Psychiatric Present- Anxiety and Frequent crying. Not Present- Bipolar, Change in Sleep Pattern, Depression and Fearful. Endocrine Not Present- Cold Intolerance, Excessive Hunger, Hair Changes, Heat Intolerance, Hot flashes and New Diabetes.  Hematology Not Present- Easy Bruising, Excessive bleeding, Gland problems, HIV and Persistent Infections.   Physical Exam Marland Kitchen T. Carrel Leather MD; 01/14/2016 10:41 AM) The physical exam findings are as follows: Note:General: Alert, well-developed and well nourished Caucasian female, in no distress Skin: Warm and dry without rash or infection. HEENT: No palpable masses or thyromegaly. Sclera nonicteric. Pupils equal round and reactive. Oropharynx clear. Lymph nodes: No cervical, supraclavicular, or inguinal nodes palpable. Breasts: No palpable masses in either breast with just some slight thickening medially at the biopsy site. No skin changes or nipple inversion or crusting Lungs: Breath sounds clear and equal. No wheezing or increased work of breathing. Cardiovascular: Regular rate and rhythm without murmer. No JVD or edema. Peripheral pulses intact. Abdomen: Nondistended. Soft and nontender. No masses palpable. No organomegaly. No palpable hernias. Extremities: No edema or joint swelling or deformity. No chronic venous stasis changes. Neurologic: Alert and fully oriented. Gait normal. No focal weakness. Psychiatric: Normal mood and affect. Thought content appropriate with normal judgement and insight    Assessment & Plan Marland Kitchen T. Joson Sapp MD; 01/14/2016 10:46 AM) STAGE I BREAST CANCER, RIGHT (C50.911) Impression: 60 year old female with a new diagnosis of cancer of the right breast, lower inner quadrant. Clinical stage 1 a, ER ppositive, PR positive, HER-2 tissue insufficient. 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 We discussed options of breast conservation with lumpectomy or total mastectomy and sentinal lymph node biopsy/dissection. Options for reconstruction were discussed. After discussion they have elected to proceed with breast conservation with radioactive seed localized lumpectomy and axillary sentinel lymph node biopsy. to be complete we will obtain an axillary ultrasound prior to surgery. We discussed the indications and nature of the procedure, and expected recovery, in detail. Surgical risks including anesthetic complications, cardiorespiratory complications, bleeding, infection, wound healing complications, blood clots, lymphedema, local and distant recurrence and possible need for further surgery based on the final pathology was discussed and understood. Chemotherapy, hormonal therapy and radiation therapy have been discussed. They have been provided with literature regarding the treatment of breast cancer. All questions were answered. They understand and agree to proceed and we will go ahead with scheduling. Current Plans Pt Education - [facility] Breast Cancer Information Given - [employer] "Breast Journey" Package Radioactive seed localized right breast lumpectomy and right axillary sentinel lymph node biopsy under general anesthesia as an outpatient

## 2016-01-27 ENCOUNTER — Encounter (HOSPITAL_BASED_OUTPATIENT_CLINIC_OR_DEPARTMENT_OTHER)
Admission: RE | Disposition: A | Discharge status: Home / Self Care | Payer: Self-pay | Source: Ambulatory Visit | Attending: General Surgery

## 2016-01-27 ENCOUNTER — Ambulatory Visit
Admission: RE | Admit: 2016-01-27 | Discharge: 2016-01-27 | Disposition: A | Discharge status: Home / Self Care | Payer: Commercial Managed Care - HMO | Source: Ambulatory Visit | Attending: General Surgery | Admitting: General Surgery

## 2016-01-27 ENCOUNTER — Ambulatory Visit (HOSPITAL_BASED_OUTPATIENT_CLINIC_OR_DEPARTMENT_OTHER): Payer: Commercial Managed Care - HMO | Admitting: Anesthesiology

## 2016-01-27 ENCOUNTER — Ambulatory Visit (HOSPITAL_BASED_OUTPATIENT_CLINIC_OR_DEPARTMENT_OTHER)
Admission: RE | Admit: 2016-01-27 | Discharge: 2016-01-27 | Disposition: A | Discharge status: Home / Self Care | Payer: Commercial Managed Care - HMO | Source: Ambulatory Visit | Attending: General Surgery | Admitting: General Surgery

## 2016-01-27 ENCOUNTER — Encounter (HOSPITAL_BASED_OUTPATIENT_CLINIC_OR_DEPARTMENT_OTHER): Payer: Self-pay | Admitting: Anesthesiology

## 2016-01-27 ENCOUNTER — Encounter (HOSPITAL_COMMUNITY)
Admission: RE | Admit: 2016-01-27 | Discharge: 2016-01-27 | Disposition: A | Discharge status: Home / Self Care | Payer: Commercial Managed Care - HMO | Source: Ambulatory Visit | Attending: General Surgery | Admitting: General Surgery

## 2016-01-27 DIAGNOSIS — C50911 Malignant neoplasm of unspecified site of right female breast: Secondary | ICD-10-CM | POA: Diagnosis present

## 2016-01-27 DIAGNOSIS — I10 Essential (primary) hypertension: Secondary | ICD-10-CM | POA: Insufficient documentation

## 2016-01-27 DIAGNOSIS — Z170 Estrogen receptor positive status [ER+]: Secondary | ICD-10-CM | POA: Diagnosis not present

## 2016-01-27 DIAGNOSIS — F17200 Nicotine dependence, unspecified, uncomplicated: Secondary | ICD-10-CM | POA: Insufficient documentation

## 2016-01-27 DIAGNOSIS — C50311 Malignant neoplasm of lower-inner quadrant of right female breast: Secondary | ICD-10-CM | POA: Insufficient documentation

## 2016-01-27 DIAGNOSIS — J45909 Unspecified asthma, uncomplicated: Secondary | ICD-10-CM | POA: Insufficient documentation

## 2016-01-27 DIAGNOSIS — F419 Anxiety disorder, unspecified: Secondary | ICD-10-CM | POA: Insufficient documentation

## 2016-01-27 HISTORY — PX: BREAST LUMPECTOMY: SHX2

## 2016-01-27 HISTORY — DX: Adverse effect of unspecified anesthetic, initial encounter: T41.45XA

## 2016-01-27 HISTORY — DX: Nausea with vomiting, unspecified: R11.2

## 2016-01-27 HISTORY — DX: Other complications of anesthesia, initial encounter: T88.59XA

## 2016-01-27 HISTORY — PX: BREAST LUMPECTOMY WITH RADIOACTIVE SEED AND SENTINEL LYMPH NODE BIOPSY: SHX6550

## 2016-01-27 HISTORY — DX: Other specified postprocedural states: Z98.890

## 2016-01-27 HISTORY — DX: Malignant (primary) neoplasm, unspecified: C80.1

## 2016-01-27 SURGERY — BREAST LUMPECTOMY WITH RADIOACTIVE SEED AND SENTINEL LYMPH NODE BIOPSY
Anesthesia: General | Site: Breast | Laterality: Right

## 2016-01-27 MED ORDER — MIDAZOLAM HCL 2 MG/2ML IJ SOLN
INTRAMUSCULAR | Status: AC
  Filled 2016-01-27: qty 2

## 2016-01-27 MED ORDER — OXYCODONE HCL 5 MG PO TABS: 5.0000 mg | ORAL_TABLET | Freq: Once | ORAL | Status: DC | PRN

## 2016-01-27 MED ORDER — PROMETHAZINE HCL 25 MG/ML IJ SOLN: 6.2500 mg | INTRAMUSCULAR | Status: DC | PRN

## 2016-01-27 MED ORDER — PROPOFOL 500 MG/50ML IV EMUL
INTRAVENOUS | Status: AC
  Filled 2016-01-27: qty 50

## 2016-01-27 MED ORDER — FENTANYL CITRATE (PF) 100 MCG/2ML IJ SOLN
INTRAMUSCULAR | Status: AC
  Filled 2016-01-27: qty 2

## 2016-01-27 MED ORDER — GLYCOPYRROLATE 0.2 MG/ML IJ SOLN: 0.2000 mg | Freq: Once | INTRAMUSCULAR | Status: DC | PRN

## 2016-01-27 MED ORDER — FENTANYL CITRATE (PF) 100 MCG/2ML IJ SOLN
50.0000 ug | INTRAMUSCULAR | Status: AC | PRN
  Administered 2016-01-27: 25 ug via INTRAVENOUS
  Administered 2016-01-27 (×2): 50 ug via INTRAVENOUS
  Administered 2016-01-27: 25 ug via INTRAVENOUS
  Administered 2016-01-27: 50 ug via INTRAVENOUS

## 2016-01-27 MED ORDER — BUPIVACAINE HCL (PF) 0.5 % IJ SOLN
INTRAMUSCULAR | Status: DC | PRN
  Administered 2016-01-27: 30 mL via PERINEURAL

## 2016-01-27 MED ORDER — ONDANSETRON HCL 4 MG/2ML IJ SOLN
INTRAMUSCULAR | Status: AC
  Filled 2016-01-27: qty 2

## 2016-01-27 MED ORDER — PHENYLEPHRINE 40 MCG/ML (10ML) SYRINGE FOR IV PUSH (FOR BLOOD PRESSURE SUPPORT)
PREFILLED_SYRINGE | INTRAVENOUS | Status: DC | PRN
  Administered 2016-01-27 (×3): 80 ug via INTRAVENOUS

## 2016-01-27 MED ORDER — PROPOFOL 10 MG/ML IV BOLUS
INTRAVENOUS | Status: DC | PRN
  Administered 2016-01-27: 150 mg via INTRAVENOUS
  Administered 2016-01-27: 50 mg via INTRAVENOUS

## 2016-01-27 MED ORDER — HYDROMORPHONE HCL 1 MG/ML IJ SOLN: 0.2500 mg | INTRAMUSCULAR | Status: DC | PRN

## 2016-01-27 MED ORDER — ONDANSETRON HCL 4 MG/2ML IJ SOLN
INTRAMUSCULAR | Status: DC | PRN
  Administered 2016-01-27: 4 mg via INTRAVENOUS

## 2016-01-27 MED ORDER — SCOPOLAMINE 1 MG/3DAYS TD PT72
1.0000 | MEDICATED_PATCH | Freq: Once | TRANSDERMAL | Status: DC | PRN
  Administered 2016-01-27: 1.5 mg via TRANSDERMAL

## 2016-01-27 MED ORDER — BUPIVACAINE-EPINEPHRINE (PF) 0.25% -1:200000 IJ SOLN
INTRAMUSCULAR | Status: AC
  Filled 2016-01-27: qty 30

## 2016-01-27 MED ORDER — TECHNETIUM TC 99M SULFUR COLLOID FILTERED
1.0000 | Freq: Once | INTRAVENOUS | Status: AC | PRN
  Administered 2016-01-27: 1 via INTRADERMAL

## 2016-01-27 MED ORDER — MIDAZOLAM HCL 2 MG/2ML IJ SOLN
1.0000 mg | INTRAMUSCULAR | Status: DC | PRN
  Administered 2016-01-27 (×2): 1 mg via INTRAVENOUS
  Administered 2016-01-27: 2 mg via INTRAVENOUS

## 2016-01-27 MED ORDER — SODIUM CHLORIDE 0.9 % IJ SOLN
INTRAMUSCULAR | Status: AC
  Filled 2016-01-27: qty 10

## 2016-01-27 MED ORDER — ATROPINE SULFATE 0.4 MG/ML IJ SOLN
INTRAMUSCULAR | Status: AC
  Filled 2016-01-27: qty 1

## 2016-01-27 MED ORDER — BUPIVACAINE-EPINEPHRINE (PF) 0.5% -1:200000 IJ SOLN
INTRAMUSCULAR | Status: AC
  Filled 2016-01-27: qty 30

## 2016-01-27 MED ORDER — DEXAMETHASONE SODIUM PHOSPHATE 4 MG/ML IJ SOLN
INTRAMUSCULAR | Status: DC | PRN
  Administered 2016-01-27: 10 mg via INTRAVENOUS

## 2016-01-27 MED ORDER — EPHEDRINE SULFATE-NACL 50-0.9 MG/10ML-% IV SOSY
PREFILLED_SYRINGE | INTRAVENOUS | Status: DC | PRN
  Administered 2016-01-27: 10 mg via INTRAVENOUS

## 2016-01-27 MED ORDER — CHLORHEXIDINE GLUCONATE 4 % EX LIQD: 1.0000 "application " | Freq: Once | CUTANEOUS | Status: DC

## 2016-01-27 MED ORDER — EPHEDRINE 5 MG/ML INJ
INTRAVENOUS | Status: AC
  Filled 2016-01-27: qty 10

## 2016-01-27 MED ORDER — SUCCINYLCHOLINE CHLORIDE 200 MG/10ML IV SOSY
PREFILLED_SYRINGE | INTRAVENOUS | Status: AC
  Filled 2016-01-27: qty 10

## 2016-01-27 MED ORDER — CIPROFLOXACIN IN D5W 400 MG/200ML IV SOLN
INTRAVENOUS | Status: AC
  Filled 2016-01-27: qty 200

## 2016-01-27 MED ORDER — OXYCODONE HCL 5 MG/5ML PO SOLN: 5.0000 mg | Freq: Once | ORAL | Status: DC | PRN

## 2016-01-27 MED ORDER — DEXAMETHASONE SODIUM PHOSPHATE 10 MG/ML IJ SOLN
INTRAMUSCULAR | Status: AC
  Filled 2016-01-27: qty 1

## 2016-01-27 MED ORDER — CIPROFLOXACIN IN D5W 400 MG/200ML IV SOLN
400.0000 mg | INTRAVENOUS | Status: AC
  Administered 2016-01-27: 400 mg via INTRAVENOUS

## 2016-01-27 MED ORDER — BUPIVACAINE-EPINEPHRINE (PF) 0.5% -1:200000 IJ SOLN
INTRAMUSCULAR | Status: DC | PRN
  Administered 2016-01-27: 10 mL

## 2016-01-27 MED ORDER — SCOPOLAMINE 1 MG/3DAYS TD PT72
MEDICATED_PATCH | TRANSDERMAL | Status: AC
  Filled 2016-01-27: qty 1

## 2016-01-27 MED ORDER — LACTATED RINGERS IV SOLN
INTRAVENOUS | Status: DC
  Administered 2016-01-27 (×2): via INTRAVENOUS

## 2016-01-27 MED ORDER — METHYLENE BLUE 0.5 % INJ SOLN
INTRAVENOUS | Status: AC
  Filled 2016-01-27: qty 10

## 2016-01-27 MED ORDER — LIDOCAINE 2% (20 MG/ML) 5 ML SYRINGE
INTRAMUSCULAR | Status: DC | PRN
  Administered 2016-01-27: 80 mg via INTRAVENOUS

## 2016-01-27 MED ORDER — HYDROCODONE-ACETAMINOPHEN 5-325 MG PO TABS: 1.0000 | ORAL_TABLET | ORAL | Status: DC | PRN

## 2016-01-27 SURGICAL SUPPLY — 54 items
APPLIER CLIP 9.375 MED OPEN (MISCELLANEOUS) ×2
APR CLP MED 9.3 20 MLT OPN (MISCELLANEOUS) ×1
BINDER BREAST LRG (GAUZE/BANDAGES/DRESSINGS) IMPLANT
BINDER BREAST MEDIUM (GAUZE/BANDAGES/DRESSINGS) IMPLANT
BINDER BREAST XLRG (GAUZE/BANDAGES/DRESSINGS) IMPLANT
BINDER BREAST XXLRG (GAUZE/BANDAGES/DRESSINGS) IMPLANT
BLADE SURG 15 STRL LF DISP TIS (BLADE) ×1 IMPLANT
BLADE SURG 15 STRL SS (BLADE) ×2
CANISTER SUC SOCK COL 7IN (MISCELLANEOUS) IMPLANT
CANISTER SUCT 1200ML W/VALVE (MISCELLANEOUS) ×1 IMPLANT
CHLORAPREP W/TINT 26ML (MISCELLANEOUS) ×2 IMPLANT
CLIP APPLIE 9.375 MED OPEN (MISCELLANEOUS) ×1 IMPLANT
COVER BACK TABLE 60X90IN (DRAPES) ×2 IMPLANT
COVER MAYO STAND STRL (DRAPES) ×2 IMPLANT
COVER PROBE W GEL 5X96 (DRAPES) ×2 IMPLANT
DECANTER SPIKE VIAL GLASS SM (MISCELLANEOUS) IMPLANT
DEVICE DUBIN W/COMP PLATE 8390 (MISCELLANEOUS) ×2 IMPLANT
DRAPE LAPAROSCOPIC ABDOMINAL (DRAPES) ×2 IMPLANT
DRAPE UTILITY XL STRL (DRAPES) ×2 IMPLANT
ELECT COATED BLADE 2.86 ST (ELECTRODE) ×2 IMPLANT
ELECT REM PT RETURN 9FT ADLT (ELECTROSURGICAL) ×2
ELECTRODE REM PT RTRN 9FT ADLT (ELECTROSURGICAL) ×1 IMPLANT
GLOVE BIOGEL PI IND STRL 7.0 (GLOVE) IMPLANT
GLOVE BIOGEL PI IND STRL 7.5 (GLOVE) IMPLANT
GLOVE BIOGEL PI IND STRL 8 (GLOVE) ×1 IMPLANT
GLOVE BIOGEL PI INDICATOR 7.0 (GLOVE) ×2
GLOVE BIOGEL PI INDICATOR 7.5 (GLOVE) ×1
GLOVE BIOGEL PI INDICATOR 8 (GLOVE) ×1
GLOVE ECLIPSE 6.5 STRL STRAW (GLOVE) ×1 IMPLANT
GLOVE ECLIPSE 7.5 STRL STRAW (GLOVE) ×2 IMPLANT
GLOVE SURG SS PI 7.5 STRL IVOR (GLOVE) ×1 IMPLANT
GOWN STRL REUS W/ TWL LRG LVL3 (GOWN DISPOSABLE) ×1 IMPLANT
GOWN STRL REUS W/ TWL XL LVL3 (GOWN DISPOSABLE) ×1 IMPLANT
GOWN STRL REUS W/TWL LRG LVL3 (GOWN DISPOSABLE) ×4
GOWN STRL REUS W/TWL XL LVL3 (GOWN DISPOSABLE) ×2
ILLUMINATOR WAVEGUIDE N/F (MISCELLANEOUS) ×1 IMPLANT
KIT MARKER MARGIN INK (KITS) ×2 IMPLANT
LIQUID BAND (GAUZE/BANDAGES/DRESSINGS) ×2 IMPLANT
NDL HYPO 25X1 1.5 SAFETY (NEEDLE) ×2 IMPLANT
NDL SAFETY ECLIPSE 18X1.5 (NEEDLE) ×1 IMPLANT
NEEDLE HYPO 18GX1.5 SHARP (NEEDLE)
NEEDLE HYPO 25X1 1.5 SAFETY (NEEDLE) ×2 IMPLANT
NS IRRIG 1000ML POUR BTL (IV SOLUTION) IMPLANT
PACK BASIN DAY SURGERY FS (CUSTOM PROCEDURE TRAY) ×2 IMPLANT
PENCIL BUTTON HOLSTER BLD 10FT (ELECTRODE) ×2 IMPLANT
SLEEVE SCD COMPRESS KNEE MED (MISCELLANEOUS) ×2 IMPLANT
SPONGE LAP 4X18 X RAY DECT (DISPOSABLE) ×2 IMPLANT
SUT MON AB 5-0 PS2 18 (SUTURE) ×2 IMPLANT
SUT VICRYL 3-0 CR8 SH (SUTURE) ×3 IMPLANT
SYR CONTROL 10ML LL (SYRINGE) ×3 IMPLANT
TOWEL OR 17X24 6PK STRL BLUE (TOWEL DISPOSABLE) ×2 IMPLANT
TOWEL OR NON WOVEN STRL DISP B (DISPOSABLE) ×2 IMPLANT
TUBE CONNECTING 20X1/4 (TUBING) ×1 IMPLANT
YANKAUER SUCT BULB TIP NO VENT (SUCTIONS) ×1 IMPLANT

## 2016-01-27 NOTE — Transfer of Care (Signed)
Immediate Anesthesia Transfer of Care Note  Patient: Krystal Brooks  Procedure(s) Performed: Procedure(s): BREAST LUMPECTOMY WITH RADIOACTIVE SEED AND SENTINEL LYMPH NODE BIOPSY (Right)  Patient Location: PACU  Anesthesia Type:General  Level of Consciousness: sedated  Airway & Oxygen Therapy: Patient Spontanous Breathing and Patient connected to face mask oxygen  Post-op Assessment: Report given to RN and Post -op Vital signs reviewed and stable  Post vital signs: Reviewed and stable  Last Vitals:  Filed Vitals:   01/27/16 0945 01/27/16 0946  BP: 153/79   Pulse: 89 89  Temp:    Resp:      Last Pain: There were no vitals filed for this visit.    Patients Stated Pain Goal: 3 ([id number])  Complications: No apparent anesthesia complications

## 2016-01-27 NOTE — Op Note (Signed)
Preoperative Diagnosis: RIGHT BREAST CANCER  Postoprative Diagnosis: RIGHT BREAST CANCER  Procedure: Procedure(s): RIGHT BREAST LUMPECTOMY WITH RADIOACTIVE SEED AND SENTINEL LYMPH NODE BIOPSY   Surgeon: Excell Seltzer T   Assistants: none  Anesthesia:  General LMA anesthesia  Indications: Patient has a recent diagnosis of invasive lobular carcinoma of the upper inner left breast, stage IA, with the majority of the biopsy showing lobular carcinoma in situ and minimal area of invasion. After preoperative workup and discussion regarding options and risks detailed elsewhere we have elected to proceed with radioactive seed localized right breast lumpectomy and axillary sentinel lymph node biopsy as initial surgical treatment.    Procedure Detail:  Patient had previously undergone placement of a radioactive seed at the tumor and clip site. Review of the x-ray showed the marking clip somewhat medial to the calcifications and  The seed. In the holding area the patient underwent a pectoral block and underwent injection of 1 mCi of technetium sulfur colloid intradermally around the right nipple. She was taken to the operating room, placed in the supine position on the operating table, and the laryngeal mask anesthesia induced. She received preoperative IV antibiotics. I confirmed a discrete area of high counts in the right axilla. The entire right breast and anterior chest, axilla and upper arm were widely sterilely prepped and draped. Patient timeout was performed and correct procedure verified. The lumpectomy was approached initially. I confirmed the site of the seed in the medial breast and marked the skin. A circumareolar incision was made medially and a skin and subcutaneous flap dissected medially over the area of the seed and tumor. This was exposed and confirmed with the neoprobe. I then excised a generous specimen of tissue around the seed and this was taken down to the chest wall. The specimen  was inked for margins. Specimen x-ray showed the calcifications contained within the specimen and the seed as well  But the calcifications abutted the medial margin. The marking clip was not present, noted to be more medial than the seed. I reexcised the medial margin of the lumpectomy site for at least half a centimeter which was oriented with ink and specimen x-ray showed the marking clip within the specimen and a single calcification. The lumpectomy cavity was inspected for hemostasis which was complete. The lumpectomy cavity was marked with clips. The breast and subcutaneous tissue was closed with interrupted 3-0 Vicryl. Attention was turned to the sentinel node biopsy. A hot area in the right axilla was localized and a small transverse incision made. Dissection was carried down through the subcutaneous tissue and the clavipectoral fascia was opened. Using the neoprobe for guidance I dissected down onto a small node with markedly elevated counts which was completely excised. Ex vivo counts were 2500. I was able to find one area in the axilla with still somewhat elevated counts more laterally and dissection was carried down using the neoprobe for guidance onto another small node with counts of about 250 which was excised as sentinel lymph node #2. Background in the axilla at this point was essentially 0 and there were no palpable enlarged nodes. Hemostasis was assured and the deep axillary areas of cutaneous tissue was closed with interrupted 3-0 Vicryl. Both skin  Incisions were infiltrated with Marcaine. Skin was closed with running subcuticular 5-0 Monocryl and Dermabond at both incision sites. Sponge needle and instrument counts were correct.    Findings: As above  Estimated Blood Loss:  Minimal  Drains: none  Blood Given: none          Specimens: #1 right breast lumpectomy    #2 further medial margin   #3 right axillary sentinel lymph nodes X 2        Complications:  * No complications  entered in OR log *         Disposition: PACU - hemodynamically stable.         Condition: stable

## 2016-01-27 NOTE — Anesthesia Procedure Notes (Addendum)
Anesthesia Regional Block:  Pectoralis block  Pre-Anesthetic Checklist: ,, timeout performed, Correct Patient, Correct Site, Correct Laterality, Correct Procedure, Correct Position, site marked, Risks and benefits discussed,  Surgical consent,  Pre-op evaluation,  At surgeon's request and post-op pain management  Laterality: Right  Prep: chloraprep       Needles:  Injection technique: Single-shot  Needle Type: Echogenic Needle     Needle Length: 9cm 9 cm Needle Gauge: 21 and 21 G    Additional Needles:  Procedures: ultrasound guided (picture in chart) Pectoralis block Narrative:  Injection made incrementally with aspirations every 5 mL.  Performed by: Personally   Additional Notes: Patient tolerated the procedure well without complications   Procedure Name: LMA Insertion Date/Time: 01/27/2016 10:02 AM Performed by: Lieutenant Diego Pre-anesthesia Checklist: Patient identified, Emergency Drugs available, Suction available and Patient being monitored Patient Re-evaluated:Patient Re-evaluated prior to inductionOxygen Delivery Method: Circle System Utilized Preoxygenation: Pre-oxygenation with 100% oxygen Intubation Type: IV induction Ventilation: Mask ventilation without difficulty LMA: LMA inserted LMA Size: 4.0 Number of attempts: 1 Airway Equipment and Method: Bite block Placement Confirmation: positive ETCO2 and breath sounds checked- equal and bilateral Tube secured with: Tape Dental Injury: Teeth and Oropharynx as per pre-operative assessment

## 2016-01-27 NOTE — Anesthesia Postprocedure Evaluation (Signed)
Anesthesia Post Note  Patient: Krystal Brooks  Procedure(s) Performed: Procedure(s) (LRB): BREAST LUMPECTOMY WITH RADIOACTIVE SEED AND SENTINEL LYMPH NODE BIOPSY (Right)  Patient location during evaluation: PACU Anesthesia Type: General Level of consciousness: awake and alert Pain management: pain level controlled Vital Signs Assessment: post-procedure vital signs reviewed and stable Respiratory status: spontaneous breathing, nonlabored ventilation, respiratory function stable and patient connected to nasal cannula oxygen Cardiovascular status: blood pressure returned to baseline and stable Postop Assessment: no signs of nausea or vomiting Anesthetic complications: no    Last Vitals:  Filed Vitals:   01/27/16 1130 01/27/16 1145  BP: 113/66 103/67  Pulse: 86 83  Temp:    Resp: 9 11    Last Pain: There were no vitals filed for this visit.               Kaeson Kleinert S

## 2016-01-27 NOTE — Progress Notes (Signed)
Assisted Dr. Kalman Shan with right, ultrasound guided, pectoralis block. Side rails up, monitors on throughout procedure. See vital signs in flow sheet. Tolerated Procedure well. . , monitors on throughout procedure.

## 2016-01-27 NOTE — Discharge Instructions (Signed)
[facility] °Office Phone Number [phone] ° °BREAST BIOPSY/ PARTIAL MASTECTOMY: POST OP INSTRUCTIONS ° °Always review your discharge instruction sheet given to you by the facility where your surgery was performed. ° °IF YOU HAVE DISABILITY OR FAMILY LEAVE FORMS, YOU MUST BRING THEM TO THE OFFICE FOR PROCESSING.  DO NOT GIVE THEM TO YOUR DOCTOR. ° °1. A prescription for pain medication may be given to you upon discharge.  Take your pain medication as prescribed, if needed.  If narcotic pain medicine is not needed, then you may take acetaminophen (Tylenol) or ibuprofen (Advil) as needed. °2. Take your usually prescribed medications unless otherwise directed °3. If you need a refill on your pain medication, please contact your pharmacy.  They will contact our office to request authorization.  Prescriptions will not be filled after 5pm or on week-ends. °4. You should eat very light the first 24 hours after surgery, such as soup, crackers, pudding, etc.  Resume your normal diet the day after surgery. °5. Most patients will experience some swelling and bruising in the breast.  Ice packs and a good support bra will help.  Swelling and bruising can take several days to resolve.  °6. It is common to experience some constipation if taking pain medication after surgery.  Increasing fluid intake and taking a stool softener will usually help or prevent this problem from occurring.  A mild laxative (Milk of Magnesia or Miralax) should be taken according to package directions if there are no bowel movements after 48 hours. °7. Unless discharge instructions indicate otherwise, you may remove your bandages 24-48 hours after surgery, and you may shower at that time.  You may have steri-strips (small skin tapes) in place directly over the incision.  These strips should be left on the skin for 7-10 days.  If your surgeon used skin glue on the incision, you may shower in 24 hours.  The glue will flake off over the  next 2-3 weeks.  Any sutures or staples will be removed at the office during your follow-up visit. °8. ACTIVITIES:  You may resume regular daily activities (gradually increasing) beginning the next day.  Wearing a good support bra or sports bra minimizes pain and swelling.  You may have sexual intercourse when it is comfortable. °a. You may drive when you no longer are taking prescription pain medication, you can comfortably wear a seatbelt, and you can safely maneuver your car and apply brakes. °b. RETURN TO WORK:  ______________________________________________________________________________________ °9. You should see your doctor in the office for a follow-up appointment approximately two weeks after your surgery.  Your doctor’s nurse will typically make your follow-up appointment when she calls you with your pathology report.  Expect your pathology report 2-3 business days after your surgery.  You may call to check if you do not hear from us after three days. °10. OTHER INSTRUCTIONS: _______________________________________________________________________________________________ _____________________________________________________________________________________________________________________________________ °_____________________________________________________________________________________________________________________________________ °_____________________________________________________________________________________________________________________________________ ° °WHEN TO CALL YOUR DOCTOR: °1. Fever over 101.0 °2. Nausea and/or vomiting. °3. Extreme swelling or bruising. °4. Continued bleeding from incision. °5. Increased pain, redness, or drainage from the incision. ° °The clinic staff is available to answer your questions during regular business hours.  Please don’t hesitate to call and ask to speak to one of the nurses for clinical concerns.  If you have a medical emergency, go to the nearest  emergency room or call 911.  A surgeon from [facility] is always on call at the hospital. ° °For further questions, please visit [email or web address]  ° ° ° °  Post Anesthesia Home Care Instructions ° °Activity: °Get plenty of rest for the remainder of the day. A responsible adult should stay with you for 24 hours following the procedure.  °For the next 24 hours, DO NOT: °-Drive a car °-Operate machinery °-Drink alcoholic beverages °-Take any medication unless instructed by your physician °-Make any legal decisions or sign important papers. ° °Meals: °Start with liquid foods such as gelatin or soup. Progress to regular foods as tolerated. Avoid greasy, spicy, heavy foods. If nausea and/or vomiting occur, drink only clear liquids until the nausea and/or vomiting subsides. Call your physician if vomiting continues. ° °Special Instructions/Symptoms: °Your throat may feel dry or sore from the anesthesia or the breathing tube placed in your throat during surgery. If this causes discomfort, gargle with warm salt water. The discomfort should disappear within 24 hours. ° °If you had a scopolamine patch placed behind your ear for the management of post- operative nausea and/or vomiting: ° °1. The medication in the patch is effective for 72 hours, after which it should be removed.  Wrap patch in a tissue and discard in the trash. Wash hands thoroughly with soap and water. °2. You may remove the patch earlier than 72 hours if you experience unpleasant side effects which may include dry mouth, dizziness or visual disturbances. °3. Avoid touching the patch. Wash your hands with soap and water after contact with the patch. °  ° °

## 2016-01-27 NOTE — Interval H&P Note (Signed)
History and Physical Interval Note:  01/27/2016 9:26 AM  Krystal Brooks  has presented today for surgery, with the diagnosis of RIGHT BREAST CANCER  The various methods of treatment have been discussed with the patient and family. After consideration of risks, benefits and other options for treatment, the patient has consented to  Procedure(s): BREAST LUMPECTOMY WITH RADIOACTIVE SEED AND SENTINEL LYMPH NODE BIOPSY (Right) as a surgical intervention .  The patient's history has been reviewed, patient examined, no change in status, stable for surgery.  I have reviewed the patient's chart and labs.  Questions were answered to the patient's satisfaction.     Brittanni Cariker T

## 2016-01-27 NOTE — Anesthesia Preprocedure Evaluation (Signed)
Anesthesia Evaluation  Patient identified by MRN, date of birth, ID band Patient awake    Reviewed: Allergy & Precautions, NPO status , Patient's Chart, lab work & pertinent test results  History of Anesthesia Complications (+) PONV  Airway Mallampati: II  TM Distance: >3 FB Neck ROM: Full    Dental no notable dental hx.    Pulmonary asthma , Current Smoker,    Pulmonary exam normal breath sounds clear to auscultation       Cardiovascular hypertension, Pt. on medications + Peripheral Vascular Disease  Normal cardiovascular exam Rhythm:Regular Rate:Normal     Neuro/Psych Anxiety negative neurological ROS     GI/Hepatic negative GI ROS, Neg liver ROS,   Endo/Other  negative endocrine ROS  Renal/GU negative Renal ROS  negative genitourinary   Musculoskeletal negative musculoskeletal ROS (+)   Abdominal   Peds negative pediatric ROS (+)  Hematology negative hematology ROS (+)   Anesthesia Other Findings   Reproductive/Obstetrics negative OB ROS                             Anesthesia Physical Anesthesia Plan  ASA: III  Anesthesia Plan: General   Post-op Pain Management: GA combined w/ Regional for post-op pain   Induction: Intravenous  Airway Management Planned: LMA  Additional Equipment:   Intra-op Plan:   Post-operative Plan: Extubation in OR  Informed Consent: I have reviewed the patients History and Physical, chart, labs and discussed the procedure including the risks, benefits and alternatives for the proposed anesthesia with the patient or authorized representative who has indicated his/her understanding and acceptance.   Dental advisory given  Plan Discussed with: CRNA and Surgeon  Anesthesia Plan Comments:         Anesthesia Quick Evaluation

## 2016-01-28 ENCOUNTER — Encounter (HOSPITAL_BASED_OUTPATIENT_CLINIC_OR_DEPARTMENT_OTHER): Payer: Self-pay | Admitting: General Surgery

## 2016-02-02 ENCOUNTER — Telehealth: Payer: Self-pay | Admitting: *Deleted

## 2016-02-02 NOTE — Telephone Encounter (Signed)
Called pt to inform that Dr. Burr Medico did not recommend oncotype testing d/t insufficient tissue. Referral placed for pt to see Dr. Pablo Ledger for xrt. Denies needs or questions at this time.

## 2016-02-05 NOTE — Progress Notes (Signed)
Location of Breast Cancer:Lower-inner quadrant of right female breast  Histology per Pathology Report:  Diagnosis 01-27-16 1. Breast, lumpectomy, Right - SCATTERED MICROSCOPIC FOCI OF INVASIVE LOBULAR CARCINOMA, PLEOMORPHIC VARIANT, GRADE II/III. - LOBULAR CARCINOMA IN SITU, PLEOMORPHIC VARIANT. - THE SURGICAL RESECTION MARGINS ARE NEGATIVE FOR INVASIVE CARCINOMA. - SEE ONCOLOGY TABLE BELOW. 2. Breast, excision, Right Additional Medial Margin - FIBROCYSTIC CHANGES WITH ADENOSIS AND CALCIFICATIONS. - HEALING BIOPSY SITE. - THERE IS NO EVIDENCE OF MALIGNANCY. - SEE COMMENT. 3. Lymph node, sentinel, biopsy, Right Axillary #1 - THERE IS NO EVIDENCE OF CARCINOMA IN 1 OF 1 LYMPH NODE (0/1). - SEE COMMENT. 4. Lymph node, sentinel, biopsy, Right Axillary #2 - THERE IS NO EVIDENCE OF CARCINOMA IN 1 OF 1 LYMPH NODE (0/1).   Receptor Status: ER(10 % moderate), PR (0%), Her2-neu (Insufficient invasive tumor), Ki-(10%)  Diagnosis 01-02-16 Breast, right, needle core biopsy, lower, inner quadrant - INVASIVE MAMMARY CARCINOMA - MAMMARY CARCINOMA IN SITU WITH NECROSIS AND CALCIFICATIONS  Receptor Status: ER(10% +) PR (0% -), Her2-neu (Insufficient), Ki-(10%)  Mrs. Krystal Brooks presented with an abnormal screening mammogram.  Past/Anticipated interventions by surgeon, if UJW:JXBJY surgery, 01-02-16 Right breast biopsy  Past/Anticipated interventions by medical oncology, if any: Radiation referral  Lymphedema issues, if any: No  Pain issues, if any: No   SAFETY ISSUES:  Prior radiation?: No  Pacemaker/ICD? : No  Possible current pregnancy?: No  Is the patient on methotrexate? : No  Current Complaints / other details:  Her to discuss  radiation plan    Menarche 11 LMP: 1990 she has hysrectomy for cerical dysplasia  Contraceptive: 16 years  HRT: one year in 2008 G2P2: daughters 22 and 27, 3 grandchildern  BP 143/64 mmHg  Pulse 97  Temp(Src) 97.8 F (36.6 C) (Oral)  Resp 18  Ht 5'  1" (1.549 m)  Wt 135 lb 8 oz (61.462 kg)  BMI 25.62 kg/m2  SpO2 100% Krystal Spurling, RN 02/05/2016,10:58 AM

## 2016-02-10 ENCOUNTER — Other Ambulatory Visit: Payer: Self-pay | Admitting: Family Medicine

## 2016-02-11 ENCOUNTER — Ambulatory Visit
Admission: RE | Admit: 2016-02-11 | Discharge: 2016-02-11 | Disposition: A | Discharge status: Home / Self Care | Payer: Commercial Managed Care - HMO | Source: Ambulatory Visit | Attending: Radiation Oncology | Admitting: Radiation Oncology

## 2016-02-11 ENCOUNTER — Encounter: Payer: Self-pay | Admitting: Radiation Oncology

## 2016-02-11 VITALS — BP 143/64 | HR 97 | Temp 97.8°F | Resp 18 | Ht 61.0 in | Wt 135.5 lb

## 2016-02-11 DIAGNOSIS — Z170 Estrogen receptor positive status [ER+]: Secondary | ICD-10-CM | POA: Insufficient documentation

## 2016-02-11 DIAGNOSIS — C50311 Malignant neoplasm of lower-inner quadrant of right female breast: Secondary | ICD-10-CM

## 2016-02-11 DIAGNOSIS — Z510 Encounter for antineoplastic radiation therapy: Secondary | ICD-10-CM | POA: Diagnosis present

## 2016-02-11 NOTE — Progress Notes (Signed)
   Department of Radiation Oncology  Phone:  [phone] Fax:        [phone]   Name: Krystal Brooks MRN: [id number]  DOB: 10/03/1955  Date: 02/11/2016  Follow Up Visit Note  Diagnosis: Breast cancer of lower-inner quadrant of right female breast [facility])   Staging form: Breast, AJCC 7th Edition     Clinical stage from 01/02/2016: Stage IA (T1a, N0, M0) - Signed by Truitt Merle, MD on 01/14/2016  Interval History: Blima presents today for routine followup.  I saw her last 01/14/2016 for initial consultation. She had an abnormal routine screening mammogram 12/19/2015. This noted calcifications in the lower inner quadrant of the right breast warranting further evaluation. Diagnostic mammogram on 12/29/15 noted a 2.3 x 2.1 x 2.2 cm group of coarse heterogeneous calcifications corresponding to what was seen on the screening mammogram. 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  Since then, she had a lumpectomy and sentinel node biopsy 01/27/2016, revealing scattered microscopic foci of invasive lobular carcinoma, grade II/III, and lobular carcinoma in situ. She had 0/2 positive lymph nodes. She had insufficient testing for Oncotype. Her margins were negative. She is accompanied by her husband.   Physical Exam:  Filed Vitals:   02/11/16 1442  BP: 143/64  Pulse: 97  Temp: 97.8 F (36.6 C)  TempSrc: Oral  Resp: 18  Height: _0  (1.549 m)  Weight: 135 lb 8 oz (61.462 kg)  SpO2: 100%  Sentinel lymph node and periarolar incisions are healing well with no signs of infection.  IMPRESSION: Cambrea is a 60 y.o. 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 She is a good candidate for external radiation.   PLAN:  I spoke to the patient today regarding her diagnosis and options for treatment. We  discussed the equivalence in terms of survival and local failure between mastectomy and breast conservation. We discussed the role of radiation in decreasing local failures in patients who undergo lumpectomy. We discussed the process of simulation and the placement tattoos. We discussed 4-6 weeks of treatment as an outpatient. We discussed the possibility of asymptomatic lung damage. We discussed the low likelihood of secondary malignancies. We discussed the possible side effects including but not limited to skin redness, fatigue, permanent skin darkening, and breast swelling. This procedure has been fully reviewed with the patient and written informed consent has been obtained.   Her planning appointment is scheduled 02/17/2016 at 9 am. She has a vacation planned July 3rd - 7th and will start treatment the week after.   She has a follow up appointment with Dr. Excell Seltzer 02/12/2016.   ------------------------------------------------  Thea Silversmith, MD  This document serves as a record of services personally performed by Thea Silversmith, MD. It was created on her behalf by  Lendon Collar, a trained medical scribe. The creation of this record is based on the scribe's personal observations and the provider's statements to them. This document has been checked and approved by the attending provider.

## 2016-02-13 NOTE — Addendum Note (Signed)
Encounter addended by: Malena Edman, RN on: 02/13/2016 10:15 AM<BR>     Documentation filed: Charges VN

## 2016-02-17 ENCOUNTER — Ambulatory Visit
Admission: RE | Admit: 2016-02-17 | Discharge: 2016-02-17 | Disposition: A | Discharge status: Home / Self Care | Payer: Commercial Managed Care - HMO | Source: Ambulatory Visit | Attending: Radiation Oncology | Admitting: Radiation Oncology

## 2016-02-17 DIAGNOSIS — Z510 Encounter for antineoplastic radiation therapy: Secondary | ICD-10-CM | POA: Diagnosis not present

## 2016-02-17 DIAGNOSIS — C50311 Malignant neoplasm of lower-inner quadrant of right female breast: Secondary | ICD-10-CM

## 2016-02-23 ENCOUNTER — Telehealth: Payer: Self-pay | Admitting: Hematology

## 2016-02-23 ENCOUNTER — Encounter: Payer: Self-pay | Admitting: Hematology

## 2016-02-23 ENCOUNTER — Ambulatory Visit (HOSPITAL_BASED_OUTPATIENT_CLINIC_OR_DEPARTMENT_OTHER): Payer: Commercial Managed Care - HMO | Admitting: Hematology

## 2016-02-23 VITALS — BP 154/78 | HR 93 | Temp 98.1°F | Resp 18 | Ht 61.0 in | Wt 135.6 lb

## 2016-02-23 DIAGNOSIS — I10 Essential (primary) hypertension: Secondary | ICD-10-CM

## 2016-02-23 DIAGNOSIS — C50311 Malignant neoplasm of lower-inner quadrant of right female breast: Secondary | ICD-10-CM

## 2016-02-23 NOTE — Telephone Encounter (Signed)
Gave pt cal & avs °

## 2016-02-23 NOTE — Progress Notes (Signed)
[place]  [phone] [phone]  Clinic follow up Note   Patient Care Team: Susy Frizzle, MD as PCP - General (Family Medicine) Truitt Merle, MD as Consulting Physician (Oncology) Excell Seltzer, MD as Consulting Physician (General Surgery) Thea Silversmith, MD as Consulting Physician (Radiation Oncology) Sylvan Cheese, NP as Nurse Practitioner (Hematology and Oncology) 02/23/2016  CHIEF COMPLAINTS:  Follow up right breast cancer   Oncology History   Breast cancer of lower-inner quadrant of right female breast [facility])   Staging form: Breast, AJCC 7th Edition     Clinical stage from 01/02/2016: Stage IA (T1a, N0, M0) - Signed by Truitt Merle, MD on 01/14/2016     Pathologic stage from 01/27/2016: Stage IA (T1b, N0, cM0) - Signed by Truitt Merle, MD on 03/07/2016          Breast cancer of lower-inner quadrant of right female breast ([place])   12/29/2015 Mammogram Diagnostic right mammogram showed a 2.3 x 2.1 x 2.2 cm group of coarse calcification within the medial slightly lower right breast.   01/02/2016 Initial Diagnosis Breast cancer of lower-inner quadrant of right female breast ([place])   01/02/2016 Initial Biopsy right breast LIQ core needle biopsy showed lobular carcinoma in situ, with a small focus of invasive lobular carcinoma, grade 2.    01/02/2016 Receptors her2 He had 10% positive, moderate staining. PR negative. Insufficient tissue for HER-2 testing.   01/27/2016 Surgery Right breast lumpectomy and sentinel lymph node biopsy.   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     HISTORY OF PRESENTING ILLNESS:  Krystal Brooks 60 y.o.  female is here because of Her newly diagnosed right breast cancer. She is accompanied by her husband and of friend to our multidisciplinary rest clinic today.  This was found by screening mammogram,  she denies any palpable mass, skin change or nipple discharge, or ulcer constitutional symptoms before the screening.  Her prior mammo 6 years ago. She had a right breast cyst removed in 1979.   She has hypertension, mild asthma, otherwise pretty healthy. She feels very well, no symptoms. She lives with her husband, her daughter, son-in-law and their 9-year-old branch regimen. She takes of her current child at home. She does not exercise regularly, but is very physically active. No family history of breast cancer or offering cancer.  GYN HISTORY  Menarchal: 11 LMP: 1990 she has hysrectomy for cerical dysplasia  Contraceptive: 16 years  HRT: one year in 2008 G2P2: daughters 79 and 56, 3 grandchildern   CURRENT THERAPY: pending adjuvant radiation   INTERIM HISTORY: Ariany returns for follow-up. She underwent right breast lumpectomy and sentinel lymph node biopsy on 01/27/2016. She tolerated surgery very well, has recovered well. She has seen radiation oncologist Dr. Tammi Klippel last week, and will start adjuvant breast radiation soon.   MEDICAL HISTORY:  Past Medical History  Diagnosis Date  . Allergy   . Asthma   . Hypertension   . Smoker   . Atherosclerosis of aorta (HCC)     bulge above renal arteries on mri 2016  . Anxiety   . Complication of anesthesia   . PONV (postoperative nausea and vomiting)   . Cancer [facility]) 2017  right breast ca    SURGICAL HISTORY: Past Surgical History  Procedure Laterality Date  . Cesarean section      x2  . Abdominal hysterectomy    . Breast surgery  1979    cyst removed rt breast  . Breast lumpectomy with radioactive seed and sentinel lymph node biopsy Right 01/27/2016    Procedure: BREAST LUMPECTOMY WITH RADIOACTIVE SEED AND SENTINEL LYMPH NODE  BIOPSY;  Surgeon: Excell Seltzer, MD;  Location: [place];  Service: General;  Laterality: Right;    SOCIAL HISTORY: Social History   Social History  . Marital Status: Married    Spouse Name: N/A  . Number of Children: N/A  . Years of Education: N/A   Occupational History  . Not on file.   Social History Main Topics  . Smoking status: Current Every Day Smoker -- 0.50 packs/day for 40 years    Types: Cigarettes  . Smokeless tobacco: Never Used  . Alcohol Use: 0.0 oz/week    0 Standard drinks or equivalent per week     Comment: social  . Drug Use: No  . Sexual Activity: Yes    Birth Control/ Protection: Surgical     Comment: hysterectomy   Other Topics Concern  . Not on file   Social History Narrative   She is a retired Customer service manager  FAMILY HISTORY: Family History  Problem Relation Age of Onset  . Hearing loss Mother   . Asthma Father   . Cancer Father     melanoma, metastases   . Diabetes Father   . Heart disease Father   . Early death Sister   . Early death Paternal Grandfather   . Cancer Maternal Grandmother 88    colon cancer     ALLERGIES:  is allergic to penicillins and sulfa antibiotics.  MEDICATIONS:  Current Outpatient Prescriptions  Medication Sig Dispense Refill  . losartan-hydrochlorothiazide (HYZAAR) 50-12.5 MG tablet Take 2 tablets by mouth daily. 60 tablet 5  . montelukast (SINGULAIR) 10 MG tablet TAKE 1 TABLET BY MOUTH EVERY DAY 90 tablet 1  . Umeclidinium-Vilanterol (ANORO ELLIPTA) 62.5-25 MCG/INH AEPB Inhale 1 puff into the lungs daily. (Patient not taking: Reported on 02/11/2016) 1 each 11  . VENTOLIN HFA 108 (90 Base) MCG/ACT inhaler USE 1-2 PUFFS EVERY 6 HOURS AS NEEDED FOR WHEEZING (Patient not taking: Reported on 02/11/2016) 18 Inhaler 3  . VENTOLIN HFA 108 (90 Base) MCG/ACT inhaler USE 1-2 PUFFS EVERY 6 HOURS AS NEEDED FOR WHEEZING (Patient not taking: Reported on 02/11/2016) 18 Inhaler 3   No current facility-administered  medications for this visit.    REVIEW OF SYSTEMS:   Constitutional: Denies fevers, chills or abnormal night sweats Eyes: Denies blurriness of vision, double vision or watery eyes Ears, nose, mouth, throat, and face: Denies mucositis or sore throat Respiratory: Denies cough, dyspnea or wheezes Cardiovascular: Denies palpitation, chest discomfort or lower extremity swelling Gastrointestinal:  Denies nausea, heartburn or change in bowel habits Skin: Denies abnormal skin rashes Lymphatics: Denies new lymphadenopathy or easy bruising Neurological:Denies numbness, tingling or new weaknesses Behavioral/Psych: Mood is stable, no new changes  All other systems were reviewed with the patient and are negative.  PHYSICAL EXAMINATION: ECOG PERFORMANCE STATUS: 0 - Asymptomatic  Filed Vitals:   02/23/16 1529  BP: 154/78  Pulse: 93  Temp: 98.1 F (36.7 C)  Resp: 18   Filed Weights   02/23/16 1529  Weight: 135 lb 9.6 oz (61.508 kg)    GENERAL:alert, no distress and comfortable  SKIN: skin color, texture, turgor are normal, no rashes or significant lesions EYES: normal, conjunctiva are pink and non-injected, sclera clear OROPHARYNX:no exudate, no erythema and lips, buccal mucosa, and tongue normal  NECK: supple, thyroid normal size, non-tender, without nodularity LYMPH:  no palpable lymphadenopathy in the cervical, axillary or inguinal LUNGS: clear to auscultation and percussion with normal breathing effort HEART: regular rate & rhythm and no murmurs and no lower extremity edema ABDOMEN:abdomen soft, non-tender and normal bowel sounds Musculoskeletal:no cyanosis of digits and no clubbing  PSYCH: alert & oriented x 3 with fluent speech NEURO: no focal motor/sensory deficits  LABORATORY DATA:  I have reviewed the data as listed Lab Results  Component Value Date   WBC 6.5 01/14/2016   HGB 12.8 01/14/2016   HCT 37.8 01/14/2016   MCV 86.8 01/14/2016   PLT 188 01/14/2016    Recent  Labs  01/14/16 0844  NA 133*  K 3.4*  CO2 31*  GLUCOSE 105  BUN 13.9  CREATININE 0.8  CALCIUM 9.6  PROT 7.4  ALBUMIN 3.8  AST 17  ALT 22  ALKPHOS 59  BILITOT 0.35   PATHOLOGY REPORT  Diagnosis 01/02/2016 Breast, right, needle core biopsy, lower, inner quadrant - INVASIVE MAMMARY CARCINOMA - 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 - SEE COMMENT. Microscopic Comment A smooth muscle myosin and calponin stain are performed on two blocks (4 stains total). A cytokeratin AE1/AE3 stain is also performed on a single block. The morphology coupled with the staining pattern is consistent with a very small focus of invasive mammary carcinoma in a background of mammary carcinoma in situ. An E-cadherin immunohistochemical stain will be performed to determine whether the tumor is ductal or lobular in nature (i.e. invasive ductal carcinoma with high grade ductal carcinoma in situ or pleomorphic invasive lobular carcinoma with lobular carcinoma in situ). The results will be reported in an addendum to follow. Although definitive grading of breast carcinoma is best done on excision, the features of the invasive tumor from the right lower inner quadrant breast needle core biopsy are compatible with a grade 2 breast carcinoma. A breast prognostic markers will be attempted (although the focus of invasive tumor is very small). The results of the profile will also be reported in an addendum to follow. Findings are called to the [facility] on 01/06/16. Dr. Lyndon Code has seen this case in consultation with agreement. (RAH:gt, 01/06/16)  ADDENDUM: An E-cadherin immunohistochemical stain is performed. 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 Dr. Lyndon Code has seen the E-cadherin immunohistochemical stain in consultation with agreement of this  assessment.  Results: IMMUNOHISTOCHEMICAL AND MORPHOMETRIC ANALYSIS PERFORMED MANUALLY Estrogen Receptor: 10%, POSITIVE, MODERATE STAINING INTENSITY Progesterone Receptor: 0%, NEGATIVE Proliferation Marker Ki67: 10%  Results: HER2 - INSUFFICIENT: Not enough tumor cells for HER2 Analysis.  Diagnosis 01/27/2016 1. Breast, lumpectomy, Right - SCATTERED MICROSCOPIC FOCI OF INVASIVE LOBULAR CARCINOMA, PLEOMORPHIC VARIANT, GRADE II/III. - LOBULAR CARCINOMA IN SITU, PLEOMORPHIC VARIANT. - THE SURGICAL RESECTION MARGINS ARE NEGATIVE FOR INVASIVE CARCINOMA. - SEE ONCOLOGY TABLE BELOW. 2. Breast, excision, Right Additional Medial Margin - FIBROCYSTIC CHANGES WITH ADENOSIS AND CALCIFICATIONS. - HEALING BIOPSY SITE. - THERE IS NO EVIDENCE OF MALIGNANCY. - SEE COMMENT. 3. Lymph node, sentinel, biopsy, Right Axillary #1 - THERE IS NO EVIDENCE OF CARCINOMA IN 1 OF 1 LYMPH NODE (0/1). - SEE COMMENT. 4. Lymph node, sentinel, biopsy, Right Axillary #  2 - THERE IS NO EVIDENCE OF CARCINOMA IN 1 OF 1 LYMPH NODE (0/1). - SEE COMMENT. Microscopic Comment 1. BREAST, INVASIVE TUMOR, WITH LYMPH NODES PRESENT Specimen, including laterality and lymph node sampling (sentinel, non-sentinel): Right breast and right axillary lymph nodes. Procedure: Seed localized lumpectomy, additional medial margin resection, and two axillary lymph node resections. Histologic type: Lobular, pleomorphic variant. Grade: 2 Tubule formation: 3 Nuclear pleomorphism: 2 Mitotic:1 Tumor size (glass slide measurement): Estimated to be 0.6 cm, see comment. Margins: Invasive, distance to closest margin: Greater than 0.2 cm to all margins. Lymphovascular invasion: Not identified. 1 of 3 FINAL for LUNETTA, MARINA L [phone]) Microscopic Comment(continued) Ductal carcinoma in situ: Not identified. Tumor focality: Unifocal, see comment. Treatment effect: N/A Extent of tumor: Confined to breast parenchyma. Lymph nodes: Examined: 2  Sentinel 0 Non-sentinel 2 Total Lymph nodes with metastasis: 0 Breast prognostic profile: Case [id number] Estrogen receptor: 10% moderate Progesterone receptor: 0% Her 2 neu: Insufficient invasive tumor Ki-67: 10% Non-neoplastic breast: Fibrocystic changes with adenosis and calcifications and healing biopsy site. TNM: pT1b, pN0 Comments: The vast majority of the nodular lesion consists of pleomorphic variant of lobular carcinoma in situ. However, there are a few scattered microscopic (all less than 0.1 cm) foci of invasive lobular carcinoma. As these foci are in contiguous tissue sections, and each section is approximately 0.3 cm, I believe the largest overall dimension of invasive tumor is up to 0.6 cm. Given that, I do not believe there is sufficient invasive tumor present in a single block to do additional studies, including Her 2 neu. (JBK:ecj 01/29/2016) 2. The surgical resection margin(s) of the specimen were inked and microscopically evaluated. . 3. and 4. Immunohistochemical stains performed on both parts #3 and #4 failed to highlight the presence of cytokeratin positive tumor cells. (JBK:ds 01/29/16) Enid Cutter MD   RADIOGRAPHIC STUDIES: I have personally reviewed the radiological images as listed and agreed with the findings in the report. Nm Sentinel Node Inj-no Rpt (breast)  01/27/2016  CLINICAL DATA: Cancer right breast Sulfur colloid was injected intradermally by the nuclear medicine technologist for breast cancer sentinel node localization.   Mm Breast Surgical Specimen  01/27/2016  CLINICAL DATA:  Status post right lumpectomy EXAM: SPECIMEN RADIOGRAPH OF THE RIGHT BREAST COMPARISON:  Previous exam(s). FINDINGS: Status post excision of the right breast. Two separate specimen radiographs were submitted for review. On the first submitted radiograph, the radioactive seed is present with associated calcifications. The calcifications are noted to abut the margin of this  specimen. A second specimen radiograph is submitted demonstrating the coil shaped biopsy marker within the central aspect of the specimen with a few punctate calcifications noted adjacent to the marker. Findings were communicated to the operating room at the time of interpretation. IMPRESSION: Specimen radiograph of the right breast. Electronically Signed   By: Pamelia Hoit M.D.   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 .  1. Right lower inner quadrant breast cancer, small focus of invasive lobular carcinoma, in the background of LCIS.  ER10%+, PR-, HER2 unknown  -I discussed her surgical path results with patient in great detail. -Majority of their breast cancer are LCIS, but there are scatted small component of invasive lobular carcinoma. Giving the negative margins, she is likely cured. -We discussed the risk of cancer recurrence, giving the very early stage disease, the risk of distant recurrence is likely low.  She unfortunately did not have sufficient tumor tissues for HER-2 or Oncotype testing. -She will start adjuvant breast radiation soon. -Given the lobular histology, I recommend her to consider adjuvant hormonal therapy with aromatase inhibitor. Her tumor has low ER positivity (10%), PR negative, so the benefit is probably moderate to low. --The potential benefit and side effects of AI, which includes but not limited to, hot flash, skin and vaginal dryness, metabolic changes ( increased blood glucose, cholesterol, weight, etc.), slightly in increased risk of cardiovascular disease, cataracts, muscular and joint discomfort, osteopenia and osteoporosis, etc, were discussed with her in great details. She will think about it. If she agrees, we'll start after she completes radiation. -We also discussed breast cancer surveillance after she completes treatment, Including annual  mammogram, breast exam every 6-12 months.  Plan -She will start breast radiation soon -I'll see her back towards the end of her radiation.   All questions were answered. The patient knows to call the clinic with any problems, questions or concerns. I spent 25 minutes counseling the patient face to face. The total time spent in the appointment was 30 minutes and more than 50% was on counseling.     Truitt Merle, MD 02/23/2016

## 2016-02-26 DIAGNOSIS — Z510 Encounter for antineoplastic radiation therapy: Secondary | ICD-10-CM | POA: Diagnosis not present

## 2016-03-08 ENCOUNTER — Ambulatory Visit
Admission: RE | Admit: 2016-03-08 | Discharge: 2016-03-08 | Disposition: A | Discharge status: Home / Self Care | Payer: Commercial Managed Care - HMO | Source: Ambulatory Visit | Admitting: Radiation Oncology

## 2016-03-08 DIAGNOSIS — Z510 Encounter for antineoplastic radiation therapy: Secondary | ICD-10-CM | POA: Diagnosis not present

## 2016-03-09 ENCOUNTER — Ambulatory Visit
Admission: RE | Admit: 2016-03-09 | Discharge: 2016-03-09 | Disposition: A | Discharge status: Home / Self Care | Payer: Commercial Managed Care - HMO | Source: Ambulatory Visit | Attending: Radiation Oncology | Admitting: Radiation Oncology

## 2016-03-09 ENCOUNTER — Ambulatory Visit
Admission: RE | Admit: 2016-03-09 | Discharge: 2016-03-09 | Disposition: A | Discharge status: Home / Self Care | Payer: Commercial Managed Care - HMO | Source: Ambulatory Visit

## 2016-03-09 DIAGNOSIS — Z510 Encounter for antineoplastic radiation therapy: Secondary | ICD-10-CM | POA: Diagnosis not present

## 2016-03-09 DIAGNOSIS — C50311 Malignant neoplasm of lower-inner quadrant of right female breast: Secondary | ICD-10-CM

## 2016-03-09 MED ORDER — RADIAPLEXRX EX GEL
Freq: Once | CUTANEOUS | Status: AC
  Administered 2016-03-09: 10:00:00 via TOPICAL

## 2016-03-09 MED ORDER — ALRA NON-METALLIC DEODORANT (RAD-ONC)
1.0000 "application " | Freq: Once | TOPICAL | Status: AC
  Administered 2016-03-09: 1 via TOPICAL

## 2016-03-09 NOTE — Progress Notes (Signed)
Pt education, radiation therapy and you book, my business card, alra and radiaplex gel given, discused ways to manage side effects, fatigue, skin irritation, swelling of breast, increase protein in diet, drink plenty water, electric shaver, verbal understanding, teach back given

## 2016-03-10 ENCOUNTER — Ambulatory Visit
Admission: RE | Admit: 2016-03-10 | Discharge: 2016-03-10 | Disposition: A | Discharge status: Home / Self Care | Payer: Commercial Managed Care - HMO | Source: Ambulatory Visit

## 2016-03-10 DIAGNOSIS — Z510 Encounter for antineoplastic radiation therapy: Secondary | ICD-10-CM | POA: Diagnosis not present

## 2016-03-11 ENCOUNTER — Ambulatory Visit
Admission: RE | Admit: 2016-03-11 | Discharge: 2016-03-11 | Disposition: A | Discharge status: Home / Self Care | Payer: Commercial Managed Care - HMO | Source: Ambulatory Visit

## 2016-03-11 DIAGNOSIS — Z510 Encounter for antineoplastic radiation therapy: Secondary | ICD-10-CM | POA: Diagnosis not present

## 2016-03-12 ENCOUNTER — Ambulatory Visit
Admission: RE | Admit: 2016-03-12 | Discharge: 2016-03-12 | Disposition: A | Discharge status: Home / Self Care | Payer: Commercial Managed Care - HMO | Source: Ambulatory Visit | Attending: Radiation Oncology | Admitting: Radiation Oncology

## 2016-03-12 ENCOUNTER — Encounter: Payer: Self-pay | Admitting: Radiation Oncology

## 2016-03-12 ENCOUNTER — Ambulatory Visit
Admission: RE | Admit: 2016-03-12 | Discharge: 2016-03-12 | Disposition: A | Discharge status: Home / Self Care | Payer: Commercial Managed Care - HMO | Source: Ambulatory Visit

## 2016-03-12 VITALS — BP 159/84 | HR 102 | Temp 98.1°F | Resp 20 | Wt 135.5 lb

## 2016-03-12 DIAGNOSIS — Z510 Encounter for antineoplastic radiation therapy: Secondary | ICD-10-CM | POA: Diagnosis not present

## 2016-03-12 DIAGNOSIS — C50311 Malignant neoplasm of lower-inner quadrant of right female breast: Secondary | ICD-10-CM

## 2016-03-12 NOTE — Progress Notes (Signed)
Weekly rad txs,  Right breast, dryness around the nipple, skin peeling from being at beach last week, radaiplex bid, soreness in arms from moving daughter  To her house 9:36 AM BP 159/84 mmHg  Pulse 102  Temp(Src) 98.1 F (36.7 C) (Oral)  Resp 20  Wt 135 lb 8 oz (61.462 kg)  .

## 2016-03-13 ENCOUNTER — Encounter: Payer: Self-pay | Admitting: Radiation Oncology

## 2016-03-13 NOTE — Progress Notes (Signed)
   Department of Radiation Oncology  Phone:  [phone] Fax:        [phone]  Weekly Treatment Note    Name: Krystal Brooks Date: 03/13/2016 MRN: [id number] DOB: 06/17/1956   Diagnosis:     ICD-9-CM ICD-10-CM   1. Breast cancer of lower-inner quadrant of right female breast (HCC) 174.3 C50.311      Current dose: 8 Gy  Current fraction: 4   MEDICATIONS: Current Outpatient Prescriptions  Medication Sig Dispense Refill  . hyaluronate sodium (RADIAPLEXRX) GEL Apply 1 application topically 2 (two) times daily.    Marland Kitchen losartan-hydrochlorothiazide (HYZAAR) 50-12.5 MG tablet Take 2 tablets by mouth daily. 60 tablet 5  . montelukast (SINGULAIR) 10 MG tablet TAKE 1 TABLET BY MOUTH EVERY DAY 90 tablet 1  . non-metallic deodorant (ALRA) MISC Apply 1 application topically daily.    Marland Kitchen Umeclidinium-Vilanterol (ANORO ELLIPTA) 62.5-25 MCG/INH AEPB Inhale 1 puff into the lungs daily. 1 each 11  . VENTOLIN HFA 108 (90 Base) MCG/ACT inhaler USE 1-2 PUFFS EVERY 6 HOURS AS NEEDED FOR WHEEZING 18 Inhaler 3   No current facility-administered medications for this encounter.     ALLERGIES: Penicillins and Sulfa antibiotics   LABORATORY DATA:  Lab Results  Component Value Date   WBC 6.5 01/14/2016   HGB 12.8 01/14/2016   HCT 37.8 01/14/2016   MCV 86.8 01/14/2016   PLT 188 01/14/2016   Lab Results  Component Value Date   NA 133* 01/14/2016   K 3.4* 01/14/2016   CL 95* 02/17/2015   CO2 31* 01/14/2016   Lab Results  Component Value Date   ALT 22 01/14/2016   AST 17 01/14/2016   ALKPHOS 59 01/14/2016   BILITOT 0.35 01/14/2016     NARRATIVE: Krystal Brooks was seen today for weekly treatment management. The chart was checked and the patient's films were reviewed.  Weekly rad txs,  Right breast, dryness around the nipple, skin peeling from being at beach last week, radaiplex bid, soreness in arms from moving daughter  To her house 7:54 PM BP 159/84 mmHg  Pulse 102  Temp(Src)  98.1 F (36.7 C) (Oral)  Resp 20  Wt 135 lb 8 oz (61.462 kg)  .  PHYSICAL EXAMINATION: weight is 135 lb 8 oz (61.462 kg). Her oral temperature is 98.1 F (36.7 C). Her blood pressure is 159/84 and her pulse is 102. Her respiration is 20.        ASSESSMENT: The patient is doing satisfactorily with treatment.  PLAN: We will continue with the patient's radiation treatment as planned.

## 2016-03-15 ENCOUNTER — Ambulatory Visit
Admission: RE | Admit: 2016-03-15 | Discharge: 2016-03-15 | Disposition: A | Discharge status: Home / Self Care | Payer: Commercial Managed Care - HMO | Source: Ambulatory Visit

## 2016-03-15 DIAGNOSIS — Z510 Encounter for antineoplastic radiation therapy: Secondary | ICD-10-CM | POA: Diagnosis not present

## 2016-03-16 ENCOUNTER — Telehealth: Payer: Self-pay | Admitting: *Deleted

## 2016-03-16 ENCOUNTER — Ambulatory Visit
Admission: RE | Admit: 2016-03-16 | Discharge: 2016-03-16 | Disposition: A | Discharge status: Home / Self Care | Payer: Commercial Managed Care - HMO | Source: Ambulatory Visit

## 2016-03-16 DIAGNOSIS — Z510 Encounter for antineoplastic radiation therapy: Secondary | ICD-10-CM | POA: Diagnosis not present

## 2016-03-16 NOTE — Telephone Encounter (Signed)
  Oncology Nurse Navigator Documentation  Navigator Location: [facility] (03/16/16 1400) Navigator Encounter Type: Telephone (03/16/16 1400) Telephone: [place] Call (03/16/16 1400)         Patient Visit Type: [id number] (03/16/16 1400) Treatment Phase: First Radiation Tx (03/16/16 1400)                            Time Spent with Patient: 15 (03/16/16 1400)

## 2016-03-17 ENCOUNTER — Ambulatory Visit
Admission: RE | Admit: 2016-03-17 | Discharge: 2016-03-17 | Disposition: A | Discharge status: Home / Self Care | Payer: Commercial Managed Care - HMO | Source: Ambulatory Visit

## 2016-03-17 DIAGNOSIS — Z510 Encounter for antineoplastic radiation therapy: Secondary | ICD-10-CM | POA: Diagnosis not present

## 2016-03-18 ENCOUNTER — Ambulatory Visit
Admission: RE | Admit: 2016-03-18 | Discharge: 2016-03-18 | Disposition: A | Discharge status: Home / Self Care | Payer: Commercial Managed Care - HMO | Source: Ambulatory Visit

## 2016-03-18 DIAGNOSIS — Z510 Encounter for antineoplastic radiation therapy: Secondary | ICD-10-CM | POA: Diagnosis not present

## 2016-03-19 ENCOUNTER — Ambulatory Visit
Admission: RE | Admit: 2016-03-19 | Discharge: 2016-03-19 | Disposition: A | Discharge status: Home / Self Care | Payer: Commercial Managed Care - HMO | Source: Ambulatory Visit | Attending: Radiation Oncology | Admitting: Radiation Oncology

## 2016-03-19 ENCOUNTER — Ambulatory Visit
Admission: RE | Admit: 2016-03-19 | Discharge: 2016-03-19 | Disposition: A | Discharge status: Home / Self Care | Payer: Commercial Managed Care - HMO | Source: Ambulatory Visit

## 2016-03-19 ENCOUNTER — Encounter: Payer: Self-pay | Admitting: Radiation Oncology

## 2016-03-19 VITALS — BP 131/71 | HR 96 | Temp 97.9°F | Resp 16 | Wt 134.7 lb

## 2016-03-19 DIAGNOSIS — C50311 Malignant neoplasm of lower-inner quadrant of right female breast: Secondary | ICD-10-CM

## 2016-03-19 DIAGNOSIS — Z510 Encounter for antineoplastic radiation therapy: Secondary | ICD-10-CM | POA: Diagnosis not present

## 2016-03-19 NOTE — Progress Notes (Signed)
weely rd txs right breast 9 completed, slight rash like on breast ,tanning, dry, using radaiplex bid, itching some, gets fatigued in the evenings,  9:44 AM BP 131/71 mmHg  Pulse 96  Temp(Src) 97.9 F (36.6 C) (Oral)  Resp 16  Wt 134 lb 11.2 oz (61.1 kg)

## 2016-03-20 NOTE — Addendum Note (Signed)
Encounter addended by: Kyung Rudd, MD on: 03/20/2016  1:03 PM<BR>     Documentation filed: Notes Section

## 2016-03-20 NOTE — Progress Notes (Signed)
  Radiation Oncology         [phone] ________________________________  Name: Krystal Brooks MRN: [id number]  Date: 02/17/2016  DOB: June 19, 1956  Optical Surface Tracking Plan:  Since intensity modulated radiotherapy (IMRT) and 3D conformal radiation treatment methods are predicated on accurate and precise positioning for treatment, intrafraction motion monitoring is medically necessary to ensure accurate and safe treatment delivery.  The ability to quantify intrafraction motion without excessive ionizing radiation dose can only be performed with optical surface tracking. Accordingly, surface imaging offers the opportunity to obtain 3D measurements of patient position throughout IMRT and 3D treatments without excessive radiation exposure.  I am ordering optical surface tracking for this patient's upcoming course of radiotherapy. ________________________________  Kyung Rudd, MD 03/20/2016 1:02 PM    Reference:   Ursula Alert, J, et al. Surface imaging-based analysis of intrafraction motion for breast radiotherapy patients.Journal of [place], n. 6, nov. 2014. ISSN [id number].   Available at: <http://www.jacmp.org/index.php/jacmp/article/view/4957>.

## 2016-03-20 NOTE — Progress Notes (Signed)
   Department of Radiation Oncology  Phone:  [phone] Fax:        [phone]  Weekly Treatment Note    Name: Krystal Brooks Date: 03/20/2016 MRN: [id number] DOB: May 22, 1956   Diagnosis:     ICD-9-CM ICD-10-CM   1. Breast cancer of lower-inner quadrant of right female breast (HCC) 174.3 C50.311      Current dose: 18 Gy  Current fraction: 9   MEDICATIONS: Current Outpatient Prescriptions  Medication Sig Dispense Refill  . hyaluronate sodium (RADIAPLEXRX) GEL Apply 1 application topically 2 (two) times daily.    Marland Kitchen losartan-hydrochlorothiazide (HYZAAR) 50-12.5 MG tablet Take 2 tablets by mouth daily. 60 tablet 5  . montelukast (SINGULAIR) 10 MG tablet TAKE 1 TABLET BY MOUTH EVERY DAY 90 tablet 1  . non-metallic deodorant (ALRA) MISC Apply 1 application topically daily.    Marland Kitchen Umeclidinium-Vilanterol (ANORO ELLIPTA) 62.5-25 MCG/INH AEPB Inhale 1 puff into the lungs daily. 1 each 11  . VENTOLIN HFA 108 (90 Base) MCG/ACT inhaler USE 1-2 PUFFS EVERY 6 HOURS AS NEEDED FOR WHEEZING 18 Inhaler 3   No current facility-administered medications for this encounter.     ALLERGIES: Penicillins and Sulfa antibiotics   LABORATORY DATA:  Lab Results  Component Value Date   WBC 6.5 01/14/2016   HGB 12.8 01/14/2016   HCT 37.8 01/14/2016   MCV 86.8 01/14/2016   PLT 188 01/14/2016   Lab Results  Component Value Date   NA 133* 01/14/2016   K 3.4* 01/14/2016   CL 95* 02/17/2015   CO2 31* 01/14/2016   Lab Results  Component Value Date   ALT 22 01/14/2016   AST 17 01/14/2016   ALKPHOS 59 01/14/2016   BILITOT 0.35 01/14/2016     NARRATIVE: Krystal Brooks was seen today for weekly treatment management. The chart was checked and the patient's films were reviewed.  weely rd txs right breast 9 completed, slight rash like on breast ,tanning, dry, using radaiplex bid, itching some, gets fatigued in the evenings,  12:21 PM BP 131/71 mmHg  Pulse 96  Temp(Src) 97.9 F (36.6 C)  (Oral)  Resp 16  Wt 134 lb 11.2 oz (61.1 kg)  PHYSICAL EXAMINATION: weight is 134 lb 11.2 oz (61.1 kg). Her oral temperature is 97.9 F (36.6 C). Her blood pressure is 131/71 and her pulse is 96. Her respiration is 16.        ASSESSMENT: The patient is doing satisfactorily with treatment.  PLAN: We will continue with the patient's radiation treatment as planned.

## 2016-03-20 NOTE — Progress Notes (Signed)
  Radiation Oncology         [phone] ________________________________  Name: SA FEHRMAN MRN: [id number]  Date: 02/17/2016  DOB: April 16, 1956  DIAGNOSIS:     ICD-9-CM ICD-10-CM   1. Breast cancer of lower-inner quadrant of right female breast ([place]) 174.3 C50.311      SIMULATION AND TREATMENT PLANNING NOTE  The patient presented for simulation prior to beginning her course of radiation treatment for her diagnosis of Right-sided breast cancer. The patient was placed in a supine position on a breast board. A customized vac-lock bag was constructed and this complex treatment device will be used on a daily basis during her treatment. In this fashion, a CT scan was obtained through the chest area and an isocenter was placed near the chest wall within the breast.  The patient will be planned to receive a course of radiation initially to a dose of 50 Gy. This will consist of a whole breast radiotherapy technique. To accomplish this, 2 customized blocks have been designed which will correspond to medial and lateral whole breast tangent fields. This treatment will be accomplished at 2 Gy per fraction. A forward planning technique will also be evaluated to determine if this approach improves the plan. It is anticipated that the patient will then receive a 10 Gy boost to the seroma cavity which has been contoured. This will be accomplished at 2 Gy per fraction.   This initial treatment will consist of a 3-D conformal technique. The seroma has been contoured as the primary target structure. Additionally, dose volume histograms of both this target as well as the lungs and heart will also be evaluated. Such an approach is necessary to ensure that the target area is adequately covered while the nearby critical  normal structures are adequately spared.  Plan:  The final anticipated total dose therefore will correspond to 60 Gy.    _______________________________   Jodelle Gross, MD, PhD

## 2016-03-22 ENCOUNTER — Ambulatory Visit
Admission: RE | Admit: 2016-03-22 | Discharge: 2016-03-22 | Disposition: A | Discharge status: Home / Self Care | Payer: Commercial Managed Care - HMO | Source: Ambulatory Visit

## 2016-03-22 DIAGNOSIS — Z510 Encounter for antineoplastic radiation therapy: Secondary | ICD-10-CM | POA: Diagnosis not present

## 2016-03-23 ENCOUNTER — Ambulatory Visit
Admission: RE | Admit: 2016-03-23 | Discharge: 2016-03-23 | Disposition: A | Discharge status: Home / Self Care | Payer: Commercial Managed Care - HMO | Source: Ambulatory Visit

## 2016-03-23 DIAGNOSIS — Z510 Encounter for antineoplastic radiation therapy: Secondary | ICD-10-CM | POA: Diagnosis not present

## 2016-03-24 ENCOUNTER — Ambulatory Visit
Admission: RE | Admit: 2016-03-24 | Discharge: 2016-03-24 | Disposition: A | Discharge status: Home / Self Care | Payer: Commercial Managed Care - HMO | Source: Ambulatory Visit

## 2016-03-24 DIAGNOSIS — Z510 Encounter for antineoplastic radiation therapy: Secondary | ICD-10-CM | POA: Diagnosis not present

## 2016-03-25 ENCOUNTER — Ambulatory Visit
Admission: RE | Admit: 2016-03-25 | Discharge: 2016-03-25 | Disposition: A | Discharge status: Home / Self Care | Payer: Commercial Managed Care - HMO | Source: Ambulatory Visit

## 2016-03-25 DIAGNOSIS — Z510 Encounter for antineoplastic radiation therapy: Secondary | ICD-10-CM | POA: Diagnosis not present

## 2016-03-26 ENCOUNTER — Ambulatory Visit
Admission: RE | Admit: 2016-03-26 | Discharge: 2016-03-26 | Disposition: A | Discharge status: Home / Self Care | Payer: Commercial Managed Care - HMO | Source: Ambulatory Visit | Attending: Radiation Oncology | Admitting: Radiation Oncology

## 2016-03-26 ENCOUNTER — Ambulatory Visit
Admission: RE | Admit: 2016-03-26 | Discharge: 2016-03-26 | Disposition: A | Discharge status: Home / Self Care | Payer: Commercial Managed Care - HMO | Source: Ambulatory Visit

## 2016-03-26 DIAGNOSIS — Z510 Encounter for antineoplastic radiation therapy: Secondary | ICD-10-CM | POA: Diagnosis not present

## 2016-03-26 DIAGNOSIS — C50311 Malignant neoplasm of lower-inner quadrant of right female breast: Secondary | ICD-10-CM

## 2016-03-26 MED ORDER — SONAFINE EX EMUL
1.0000 "application " | Freq: Once | CUTANEOUS | Status: AC
  Administered 2016-03-26: 1 via TOPICAL

## 2016-03-26 NOTE — Progress Notes (Signed)
   Department of Radiation Oncology  Phone:  [phone] Fax:        [phone]  Weekly Treatment Note    Name: Krystal Brooks Date: 03/26/2016 MRN: [id number] DOB: 1956-07-29   Diagnosis:     ICD-9-CM ICD-10-CM   1. Breast cancer of lower-inner quadrant of right female breast ([facility]) 174.3 C50.311 SONAFINE emulsion 1 application     Current dose: 28 Gy  Current fraction: 14   MEDICATIONS: Current Outpatient Prescriptions  Medication Sig Dispense Refill  . Wound Dressings (SONAFINE EX) Apply topically.    . hyaluronate sodium (RADIAPLEXRX) GEL Apply 1 application topically 2 (two) times daily.    Marland Kitchen losartan-hydrochlorothiazide (HYZAAR) 50-12.5 MG tablet Take 2 tablets by mouth daily. 60 tablet 5  . montelukast (SINGULAIR) 10 MG tablet TAKE 1 TABLET BY MOUTH EVERY DAY 90 tablet 1  . non-metallic deodorant (ALRA) MISC Apply 1 application topically daily.    Marland Kitchen Umeclidinium-Vilanterol (ANORO ELLIPTA) 62.5-25 MCG/INH AEPB Inhale 1 puff into the lungs daily. 1 each 11  . VENTOLIN HFA 108 (90 Base) MCG/ACT inhaler USE 1-2 PUFFS EVERY 6 HOURS AS NEEDED FOR WHEEZING 18 Inhaler 3   No current facility-administered medications for this encounter.      ALLERGIES: Penicillins and Sulfa antibiotics   LABORATORY DATA:  Lab Results  Component Value Date   WBC 6.5 01/14/2016   HGB 12.8 01/14/2016   HCT 37.8 01/14/2016   MCV 86.8 01/14/2016   PLT 188 01/14/2016   Lab Results  Component Value Date   NA 133 (L) 01/14/2016   K 3.4 (L) 01/14/2016   CL 95 (L) 02/17/2015   CO2 31 (H) 01/14/2016   Lab Results  Component Value Date   ALT 22 01/14/2016   AST 17 01/14/2016   ALKPHOS 59 01/14/2016   BILITOT 0.35 01/14/2016     NARRATIVE: Krystal Brooks was seen today for weekly treatment management. The chart was checked and the patient's films were reviewed. The patient reports some itchiness at the treatment area and also between the breasts.  She treats this with benadryl cream  and describes that this alleviates her symptoms somewhat. The patient says that the itching in the middle of her breasts is the most intense.   11:36 AM There were no vitals taken for this visit.  PHYSICAL EXAMINATION: vitals were not taken for this visit.      She has hyperpigmentation along upper margin of right breast, upper diagram margin, and inframammary fold.  No wet desquamation noted.   ASSESSMENT: The patient is doing satisfactorily with treatment.  PLAN: We will continue with the patient's radiation treatment as planned.        This document serves as a record of services personally performed by Shona Simpson, PA-C and Tyler Pita, MD. It was created on their behalf by Truddie Hidden, a trained medical scribe. The creation of this record is based on the scribe's personal observations and the providers' statements to them. This document has been checked and approved by the attending provider.

## 2016-03-29 ENCOUNTER — Ambulatory Visit
Admission: RE | Admit: 2016-03-29 | Discharge: 2016-03-29 | Disposition: A | Discharge status: Home / Self Care | Payer: Commercial Managed Care - HMO | Source: Ambulatory Visit

## 2016-03-29 DIAGNOSIS — Z510 Encounter for antineoplastic radiation therapy: Secondary | ICD-10-CM | POA: Diagnosis not present

## 2016-03-30 ENCOUNTER — Ambulatory Visit
Admission: RE | Admit: 2016-03-30 | Discharge: 2016-03-30 | Disposition: A | Discharge status: Home / Self Care | Payer: Commercial Managed Care - HMO | Source: Ambulatory Visit

## 2016-03-30 DIAGNOSIS — Z510 Encounter for antineoplastic radiation therapy: Secondary | ICD-10-CM | POA: Diagnosis not present

## 2016-03-31 ENCOUNTER — Ambulatory Visit
Admission: RE | Admit: 2016-03-31 | Discharge: 2016-03-31 | Disposition: A | Discharge status: Home / Self Care | Payer: Commercial Managed Care - HMO | Source: Ambulatory Visit

## 2016-03-31 DIAGNOSIS — Z510 Encounter for antineoplastic radiation therapy: Secondary | ICD-10-CM | POA: Diagnosis not present

## 2016-04-01 ENCOUNTER — Ambulatory Visit
Admission: RE | Admit: 2016-04-01 | Discharge: 2016-04-01 | Disposition: A | Discharge status: Home / Self Care | Payer: Commercial Managed Care - HMO | Source: Ambulatory Visit

## 2016-04-01 DIAGNOSIS — Z510 Encounter for antineoplastic radiation therapy: Secondary | ICD-10-CM | POA: Diagnosis not present

## 2016-04-02 ENCOUNTER — Encounter: Payer: Self-pay | Admitting: Radiation Oncology

## 2016-04-02 ENCOUNTER — Ambulatory Visit
Admission: RE | Admit: 2016-04-02 | Discharge: 2016-04-02 | Disposition: A | Discharge status: Home / Self Care | Payer: Commercial Managed Care - HMO | Source: Ambulatory Visit

## 2016-04-02 ENCOUNTER — Ambulatory Visit
Admission: RE | Admit: 2016-04-02 | Discharge: 2016-04-02 | Disposition: A | Discharge status: Home / Self Care | Payer: Commercial Managed Care - HMO | Source: Ambulatory Visit | Attending: Radiation Oncology | Admitting: Radiation Oncology

## 2016-04-02 VITALS — BP 124/46 | HR 108 | Temp 97.7°F | Resp 20 | Wt 133.7 lb

## 2016-04-02 DIAGNOSIS — Z510 Encounter for antineoplastic radiation therapy: Secondary | ICD-10-CM | POA: Diagnosis not present

## 2016-04-02 DIAGNOSIS — C50311 Malignant neoplasm of lower-inner quadrant of right female breast: Secondary | ICD-10-CM

## 2016-04-02 NOTE — Progress Notes (Signed)
   Department of Radiation Oncology  Phone:  [phone] Fax:        [phone]  Weekly Treatment Note    Name: Krystal Brooks Date: 04/02/2016 MRN: [id number] DOB: [birth year]   Diagnosis:     ICD-9-CM ICD-10-CM   1. Breast cancer of lower-inner quadrant of right female breast ([facility]) 174.3 C50.311      Current dose: 38 Gy  Current fraction: 19   MEDICATIONS: Current Outpatient Prescriptions  Medication Sig Dispense Refill  . losartan-hydrochlorothiazide (HYZAAR) 50-12.5 MG tablet Take 2 tablets by mouth daily. 60 tablet 5  . montelukast (SINGULAIR) 10 MG tablet TAKE 1 TABLET BY MOUTH EVERY DAY 90 tablet 1  . non-metallic deodorant (ALRA) MISC Apply 1 application topically daily.    Marland Kitchen Umeclidinium-Vilanterol (ANORO ELLIPTA) 62.5-25 MCG/INH AEPB Inhale 1 puff into the lungs daily. 1 each 11  . VENTOLIN HFA 108 (90 Base) MCG/ACT inhaler USE 1-2 PUFFS EVERY 6 HOURS AS NEEDED FOR WHEEZING 18 Inhaler 3  . Wound Dressings (SONAFINE EX) Apply topically.    . hyaluronate sodium (RADIAPLEXRX) GEL Apply 1 application topically 2 (two) times daily.     No current facility-administered medications for this encounter.      ALLERGIES: Penicillins and Sulfa antibiotics   LABORATORY DATA:  Lab Results  Component Value Date   WBC 6.5 01/14/2016   HGB 12.8 01/14/2016   HCT 37.8 01/14/2016   MCV 86.8 01/14/2016   PLT 188 01/14/2016   Lab Results  Component Value Date   NA 133 (L) 01/14/2016   K 3.4 (L) 01/14/2016   CL 95 (L) 02/17/2015   CO2 31 (H) 01/14/2016   Lab Results  Component Value Date   ALT 22 01/14/2016   AST 17 01/14/2016   ALKPHOS 59 01/14/2016   BILITOT 0.35 01/14/2016     NARRATIVE: [name] was seen today for weekly treatment management. The chart was checked and the patient's films were reviewed.  Weekly rad txs right breast 19/25 completed so far, rash/dry desquamation on breast and mid chest,  Using sonafine cream and benadryl cream for the  itching, appetite good, fatigue mild, heaviness feeling and swelling in right breast Gaspar Garbe, RN II Rad/Onc BP (!) 124/46 (BP Location: Left Arm, Patient Position: Sitting, Cuff Size: Normal)   Pulse (!) 108   Temp 97.7 F (36.5 C) (Oral)   Resp 20   Wt 133 lb 11.2 oz (60.6 kg)   BMI 25.26 kg/m    PHYSICAL EXAMINATION: weight is 133 lb 11.2 oz (60.6 kg). Her oral temperature is 97.7 F (36.5 C). Her blood pressure is 124/46 (abnormal) and her pulse is 108 (abnormal). Her respiration is 20.      Significant dermatitis is present in the upper inner aspect of the treatment area. No moist desquamation.  ASSESSMENT: The patient is doing satisfactorily with treatment.  PLAN: We will continue with the patient's radiation treatment as planned. She will continue to use her current skin care including Benadryl cream.

## 2016-04-02 NOTE — Progress Notes (Addendum)
Weekly rad txs right breast 19/25 completed so far, rash/dry desquamation on breast and mid chest,  Using sonafine cream and benadryl cream for the itching, appetite good, fatigue mild, heaviness feeling and swelling in right breast Gaspar Garbe, RN II Rad/Onc BP (!) 124/46 (BP Location: Left Arm, Patient Position: Sitting, Cuff Size: Normal)   Pulse (!) 108   Temp 97.7 F (36.5 C) (Oral)   Resp 20   Wt 133 lb 11.2 oz (60.6 kg)   BMI 25.26 kg/m

## 2016-04-05 ENCOUNTER — Ambulatory Visit
Admission: RE | Admit: 2016-04-05 | Discharge: 2016-04-05 | Disposition: A | Discharge status: Home / Self Care | Payer: Commercial Managed Care - HMO | Source: Ambulatory Visit

## 2016-04-05 ENCOUNTER — Ambulatory Visit
Admission: RE | Admit: 2016-04-05 | Discharge: 2016-04-05 | Disposition: A | Discharge status: Home / Self Care | Payer: Commercial Managed Care - HMO | Source: Ambulatory Visit | Attending: Radiation Oncology | Admitting: Radiation Oncology

## 2016-04-05 DIAGNOSIS — Z510 Encounter for antineoplastic radiation therapy: Secondary | ICD-10-CM | POA: Diagnosis not present

## 2016-04-06 ENCOUNTER — Ambulatory Visit
Admission: RE | Admit: 2016-04-06 | Discharge: 2016-04-06 | Disposition: A | Discharge status: Home / Self Care | Payer: Commercial Managed Care - HMO | Source: Ambulatory Visit

## 2016-04-06 DIAGNOSIS — Z510 Encounter for antineoplastic radiation therapy: Secondary | ICD-10-CM | POA: Diagnosis not present

## 2016-04-07 ENCOUNTER — Ambulatory Visit
Admission: RE | Admit: 2016-04-07 | Discharge: 2016-04-07 | Disposition: A | Discharge status: Home / Self Care | Payer: Commercial Managed Care - HMO | Source: Ambulatory Visit

## 2016-04-07 DIAGNOSIS — Z510 Encounter for antineoplastic radiation therapy: Secondary | ICD-10-CM | POA: Diagnosis not present

## 2016-04-08 ENCOUNTER — Ambulatory Visit
Admission: RE | Admit: 2016-04-08 | Discharge: 2016-04-08 | Disposition: A | Discharge status: Home / Self Care | Payer: Commercial Managed Care - HMO | Source: Ambulatory Visit

## 2016-04-08 DIAGNOSIS — Z510 Encounter for antineoplastic radiation therapy: Secondary | ICD-10-CM | POA: Diagnosis not present

## 2016-04-09 ENCOUNTER — Encounter: Payer: Self-pay | Admitting: Radiation Oncology

## 2016-04-09 ENCOUNTER — Ambulatory Visit
Admission: RE | Admit: 2016-04-09 | Discharge: 2016-04-09 | Disposition: A | Discharge status: Home / Self Care | Payer: Commercial Managed Care - HMO | Source: Ambulatory Visit | Attending: Radiation Oncology | Admitting: Radiation Oncology

## 2016-04-09 ENCOUNTER — Ambulatory Visit
Admission: RE | Admit: 2016-04-09 | Discharge: 2016-04-09 | Disposition: A | Discharge status: Home / Self Care | Payer: Commercial Managed Care - HMO | Source: Ambulatory Visit

## 2016-04-09 VITALS — BP 93/78 | HR 103 | Temp 97.5°F | Resp 20 | Wt 133.6 lb

## 2016-04-09 DIAGNOSIS — C50311 Malignant neoplasm of lower-inner quadrant of right female breast: Secondary | ICD-10-CM

## 2016-04-09 DIAGNOSIS — Z888 Allergy status to other drugs, medicaments and biological substances status: Secondary | ICD-10-CM

## 2016-04-09 DIAGNOSIS — C50319 Malignant neoplasm of lower-inner quadrant of unspecified female breast: Secondary | ICD-10-CM

## 2016-04-09 DIAGNOSIS — Z79899 Other long term (current) drug therapy: Secondary | ICD-10-CM

## 2016-04-09 DIAGNOSIS — Z510 Encounter for antineoplastic radiation therapy: Secondary | ICD-10-CM | POA: Diagnosis not present

## 2016-04-09 DIAGNOSIS — Z880 Allergy status to penicillin: Secondary | ICD-10-CM | POA: Insufficient documentation

## 2016-04-09 MED ORDER — SONAFINE EX EMUL
1.0000 "application " | Freq: Two times a day (BID) | CUTANEOUS | Status: DC
  Administered 2016-04-09: 1 via TOPICAL

## 2016-04-09 NOTE — Progress Notes (Addendum)
Weekly rad txs right breast rash and c/o increaed itching,  A few skin abrasion under  inframmary fold scant , using sonafine cream 2-3 x day  Gave another tube, no other issues 9:50 AM BP 93/78 (BP Location: Left Arm, Patient Position: Sitting, Cuff Size: Normal)   Pulse (!) 103   Temp 97.5 F (36.4 C) (Oral)   Resp 20   Wt 133 lb 9.6 oz (60.6 kg)   BMI 25.24 kg/m   Wt Readings from Last 3 Encounters:  04/09/16 133 lb 9.6 oz (60.6 kg)  04/02/16 133 lb 11.2 oz (60.6 kg)  03/19/16 134 lb 11.2 oz (61.1 kg)

## 2016-04-09 NOTE — Progress Notes (Signed)
   Department of Radiation Oncology  Phone:  [phone] Fax:        [phone]  Weekly Treatment Note    Name: Krystal Brooks Date: 04/11/2016 MRN: [id number] DOB: 09-Feb-1956   Diagnosis:     ICD-9-CM ICD-10-CM   1. Breast cancer of lower-inner quadrant of right female breast ([facility]) 174.3 C50.311 DISCONTINUED: SONAFINE emulsion 1 application     Current dose: 48 Gy  Current fraction: 24   MEDICATIONS: Current Outpatient Prescriptions  Medication Sig Dispense Refill  . hyaluronate sodium (RADIAPLEXRX) GEL Apply 1 application topically 2 (two) times daily.    Marland Kitchen losartan-hydrochlorothiazide (HYZAAR) 50-12.5 MG tablet Take 2 tablets by mouth daily. 60 tablet 5  . montelukast (SINGULAIR) 10 MG tablet TAKE 1 TABLET BY MOUTH EVERY DAY 90 tablet 1  . non-metallic deodorant (ALRA) MISC Apply 1 application topically daily.    Marland Kitchen Umeclidinium-Vilanterol (ANORO ELLIPTA) 62.5-25 MCG/INH AEPB Inhale 1 puff into the lungs daily. 1 each 11  . VENTOLIN HFA 108 (90 Base) MCG/ACT inhaler USE 1-2 PUFFS EVERY 6 HOURS AS NEEDED FOR WHEEZING 18 Inhaler 3  . Wound Dressings (SONAFINE EX) Apply 1 application topically 2 (two) times daily.      No current facility-administered medications for this encounter.      ALLERGIES: Penicillins and Sulfa antibiotics   LABORATORY DATA:  Lab Results  Component Value Date   WBC 6.5 01/14/2016   HGB 12.8 01/14/2016   HCT 37.8 01/14/2016   MCV 86.8 01/14/2016   PLT 188 01/14/2016   Lab Results  Component Value Date   NA 133 (L) 01/14/2016   K 3.4 (L) 01/14/2016   CL 95 (L) 02/17/2015   CO2 31 (H) 01/14/2016   Lab Results  Component Value Date   ALT 22 01/14/2016   AST 17 01/14/2016   ALKPHOS 59 01/14/2016   BILITOT 0.35 01/14/2016     NARRATIVE: Krystal Brooks was seen today for weekly treatment management. The chart was checked and the patient's films were reviewed.  Krystal Brooks returns for weekly radiation treatment to the right breast.  She reports a rash and increased itching in the treatment area. States the area is very red, irritated, and feels raw. Using Sonafine cream 2-3x day. She reports no other issues.  BP 93/78 (BP Location: Left Arm, Patient Position: Sitting, Cuff Size: Normal)   Pulse (!) 103   Temp 97.5 F (36.4 C) (Oral)   Resp 20   Wt 133 lb 9.6 oz (60.6 kg)   BMI 25.24 kg/m    PHYSICAL EXAMINATION: weight is 133 lb 9.6 oz (60.6 kg). Her oral temperature is 97.5 F (36.4 C). Her blood pressure is 93/78 and her pulse is 103 (abnormal). Her respiration is 20.      Significant dermatitis in the upper inner aspect of the treatment field and some increased dermatitis in the inframammary fold. No moist desquamation.  ASSESSMENT: The patient is doing satisfactorily with treatment.  PLAN: We will continue with the patient's radiation treatment as planned.    This document serves as a record of services personally performed by Kyung Rudd, MD. It was created on his behalf by Arlyce Harman, a trained medical scribe. The creation of this record is based on the scribe's personal observations and the provider's statements to them. This document has been checked and approved by the attending provider.  ------------------------------------------------  Jodelle Gross, MD, PhD

## 2016-04-12 ENCOUNTER — Ambulatory Visit
Admission: RE | Admit: 2016-04-12 | Discharge: 2016-04-12 | Disposition: A | Discharge status: Home / Self Care | Payer: Commercial Managed Care - HMO | Source: Ambulatory Visit

## 2016-04-12 DIAGNOSIS — Z510 Encounter for antineoplastic radiation therapy: Secondary | ICD-10-CM | POA: Diagnosis not present

## 2016-04-13 ENCOUNTER — Ambulatory Visit
Admission: RE | Admit: 2016-04-13 | Discharge: 2016-04-13 | Disposition: A | Discharge status: Home / Self Care | Payer: Commercial Managed Care - HMO | Source: Ambulatory Visit

## 2016-04-13 DIAGNOSIS — Z510 Encounter for antineoplastic radiation therapy: Secondary | ICD-10-CM | POA: Diagnosis not present

## 2016-04-14 ENCOUNTER — Ambulatory Visit
Admission: RE | Admit: 2016-04-14 | Discharge: 2016-04-14 | Disposition: A | Discharge status: Home / Self Care | Payer: Commercial Managed Care - HMO | Source: Ambulatory Visit

## 2016-04-14 ENCOUNTER — Ambulatory Visit
Admission: RE | Admit: 2016-04-14 | Discharge: 2016-04-14 | Disposition: A | Discharge status: Home / Self Care | Payer: Commercial Managed Care - HMO | Source: Ambulatory Visit | Attending: Radiation Oncology | Admitting: Radiation Oncology

## 2016-04-14 ENCOUNTER — Encounter: Payer: Self-pay | Admitting: Radiation Oncology

## 2016-04-14 VITALS — BP 150/86 | HR 97 | Temp 97.9°F

## 2016-04-14 DIAGNOSIS — Z510 Encounter for antineoplastic radiation therapy: Secondary | ICD-10-CM | POA: Diagnosis not present

## 2016-04-14 DIAGNOSIS — C50311 Malignant neoplasm of lower-inner quadrant of right female breast: Secondary | ICD-10-CM

## 2016-04-14 NOTE — Progress Notes (Signed)
   Department of Radiation Oncology  Phone:  [phone] Fax:        [phone]  Weekly Treatment Note    Name: Krystal Brooks Date: 04/14/2016 MRN: [id number] DOB: 11-Jun-1956   Diagnosis:   No diagnosis found.   Current dose: 54 Gy  Current fraction: 27   MEDICATIONS: Current Outpatient Prescriptions  Medication Sig Dispense Refill  . hyaluronate sodium (RADIAPLEXRX) GEL Apply 1 application topically 2 (two) times daily.    Marland Kitchen losartan-hydrochlorothiazide (HYZAAR) 50-12.5 MG tablet Take 2 tablets by mouth daily. 60 tablet 5  . montelukast (SINGULAIR) 10 MG tablet TAKE 1 TABLET BY MOUTH EVERY DAY 90 tablet 1  . non-metallic deodorant (ALRA) MISC Apply 1 application topically daily.    Marland Kitchen Umeclidinium-Vilanterol (ANORO ELLIPTA) 62.5-25 MCG/INH AEPB Inhale 1 puff into the lungs daily. 1 each 11  . VENTOLIN HFA 108 (90 Base) MCG/ACT inhaler USE 1-2 PUFFS EVERY 6 HOURS AS NEEDED FOR WHEEZING 18 Inhaler 3  . Wound Dressings (SONAFINE EX) Apply 1 application topically 2 (two) times daily.      No current facility-administered medications for this encounter.      ALLERGIES: Penicillins and Sulfa antibiotics   LABORATORY DATA:  Lab Results  Component Value Date   WBC 6.5 01/14/2016   HGB 12.8 01/14/2016   HCT 37.8 01/14/2016   MCV 86.8 01/14/2016   PLT 188 01/14/2016   Lab Results  Component Value Date   NA 133 (L) 01/14/2016   K 3.4 (L) 01/14/2016   CL 95 (L) 02/17/2015   CO2 31 (H) 01/14/2016   Lab Results  Component Value Date   ALT 22 01/14/2016   AST 17 01/14/2016   ALKPHOS 59 01/14/2016   BILITOT 0.35 01/14/2016     NARRATIVE: [name] was seen today for weekly treatment management. The chart was checked and the patient's films were reviewed.  Right breast radiation 27 completed. She has some dry desquamation, dermatitis, and a couple blisters one under the inframammary fold. She mentions that one popped on top of the right breast. She is using  Sonafine. She reports under the axilla it feels right and like it's pulling. She mentions she has a burning sensation under the inframammary fold. She was given a 1 month follow up appointment with Shona Simpson, PAC.  BP (!) 150/86 (BP Location: Left Arm, Patient Position: Sitting, Cuff Size: Normal)   Pulse 97   Temp 97.9 F (36.6 C) (Oral)    PHYSICAL EXAMINATION: oral temperature is 97.9 F (36.6 C). Her blood pressure is 150/86 (abnormal) and her pulse is 97.      Significant dermatitis in the upper inner aspect of the treatment field and some increased dermatitis in the inframammary fold. No moist desquamation. The patient's skin continues to show significant dermatitis. This is stable. No moist desquamation.  ASSESSMENT: The patient is doing satisfactorily with treatment.  PLAN: We will continue with the patient's radiation treatment as planned. She completes treatment 04/19/2016 and has been given a one month follow up appointment with Shona Simpson, PAC.  ------------------------------------------------  Jodelle Gross, MD, PhD    This document serves as a record of services personally performed by Kyung Rudd, MD. It was created on his behalf by Lendon Collar, a trained medical scribe. The creation of this record is based on the scribe's personal observations and the provider's statements to them. This document has been checked and approved by the attending provider.

## 2016-04-14 NOTE — Progress Notes (Signed)
  Radiation Oncology         [phone] ________________________________  Name: Krystal Brooks MRN: [id number]  Date: 04/02/2016  DOB: 08-07-1956  Complex simulation note  The patient has undergone complex simulation for her upcoming boost treatment for her diagnosis of breast cancer. The patient has initially been planned to receive 50 Gy. The patient will now receive a 10 Gy boost to the seroma cavity which has been contoured. This will be accomplished using an en face electron field. Based on the depth of the target area, 15 MeV electrons will be used. The patient's final total dose therefore will be 60 Gy. A complex isodose plan from the electron [facility] Carlo calculation is requested for the boost treatment.   _______________________________  Jodelle Gross, MD, PhD

## 2016-04-14 NOTE — Progress Notes (Addendum)
Right breast radiation 27 completed, dry desquamation, dermatitis and a couple blisters one under inframmay fold, one popped on top of right breat, using sonafine, under axilla feels tiht and pulling, burns under inframmary fold, 1 month f/u appt with Shona Simpson, PA, 9:48 AM  Wt Readings from Last 3 Encounters:  04/09/16 133 lb 9.6 oz (60.6 kg)  04/02/16 133 lb 11.2 oz (60.6 kg)  03/19/16 134 lb 11.2 oz (61.1 kg)

## 2016-04-15 ENCOUNTER — Ambulatory Visit
Admission: RE | Admit: 2016-04-15 | Discharge: 2016-04-15 | Disposition: A | Discharge status: Home / Self Care | Payer: Commercial Managed Care - HMO | Source: Ambulatory Visit

## 2016-04-15 DIAGNOSIS — Z510 Encounter for antineoplastic radiation therapy: Secondary | ICD-10-CM | POA: Diagnosis not present

## 2016-04-16 ENCOUNTER — Ambulatory Visit
Admission: RE | Admit: 2016-04-16 | Discharge: 2016-04-16 | Disposition: A | Discharge status: Home / Self Care | Payer: Commercial Managed Care - HMO | Source: Ambulatory Visit

## 2016-04-16 DIAGNOSIS — Z510 Encounter for antineoplastic radiation therapy: Secondary | ICD-10-CM | POA: Diagnosis not present

## 2016-04-19 ENCOUNTER — Ambulatory Visit
Admission: RE | Admit: 2016-04-19 | Discharge: 2016-04-19 | Disposition: A | Discharge status: Home / Self Care | Payer: Commercial Managed Care - HMO | Source: Ambulatory Visit

## 2016-04-19 ENCOUNTER — Telehealth: Payer: Self-pay | Admitting: Hematology

## 2016-04-19 ENCOUNTER — Telehealth: Payer: Self-pay | Admitting: *Deleted

## 2016-04-19 ENCOUNTER — Encounter: Payer: Self-pay | Admitting: Hematology

## 2016-04-19 ENCOUNTER — Encounter: Payer: Self-pay | Admitting: Radiation Oncology

## 2016-04-19 ENCOUNTER — Other Ambulatory Visit (HOSPITAL_BASED_OUTPATIENT_CLINIC_OR_DEPARTMENT_OTHER): Payer: Commercial Managed Care - HMO

## 2016-04-19 ENCOUNTER — Ambulatory Visit (HOSPITAL_BASED_OUTPATIENT_CLINIC_OR_DEPARTMENT_OTHER): Payer: Commercial Managed Care - HMO | Admitting: Hematology

## 2016-04-19 VITALS — BP 150/73 | HR 102 | Temp 97.9°F | Resp 18 | Ht 61.0 in | Wt 133.0 lb

## 2016-04-19 DIAGNOSIS — I10 Essential (primary) hypertension: Secondary | ICD-10-CM

## 2016-04-19 DIAGNOSIS — C50311 Malignant neoplasm of lower-inner quadrant of right female breast: Secondary | ICD-10-CM | POA: Diagnosis not present

## 2016-04-19 DIAGNOSIS — Z510 Encounter for antineoplastic radiation therapy: Secondary | ICD-10-CM | POA: Diagnosis not present

## 2016-04-19 DIAGNOSIS — Z780 Asymptomatic menopausal state: Secondary | ICD-10-CM

## 2016-04-19 LAB — COMPREHENSIVE METABOLIC PANEL
ALBUMIN: 3.7 g/dL (ref 3.5–5.0)
ALK PHOS: 64 U/L (ref 40–150)
ALT: 18 U/L (ref 0–55)
ANION GAP: 11 meq/L (ref 3–11)
AST: 17 U/L (ref 5–34)
BILIRUBIN TOTAL: 0.49 mg/dL (ref 0.20–1.20)
BUN: 9.9 mg/dL (ref 7.0–26.0)
CO2: 27 mEq/L (ref 22–29)
Calcium: 9.5 mg/dL (ref 8.4–10.4)
Chloride: 94 mEq/L — ABNORMAL LOW (ref 98–109)
Creatinine: 0.8 mg/dL (ref 0.6–1.1)
EGFR: 84 mL/min/{1.73_m2} — AB (ref >90)
GLUCOSE: 135 mg/dL (ref 70–140)
POTASSIUM: 3.4 meq/L — AB (ref 3.5–5.1)
Sodium: 132 mEq/L — ABNORMAL LOW (ref 136–145)
TOTAL PROTEIN: 7.4 g/dL (ref 6.4–8.3)

## 2016-04-19 LAB — CBC WITH DIFFERENTIAL/PLATELET
BASO%: 0.2 % (ref 0.0–2.0)
Basophils Absolute: 0 10*3/uL (ref 0.0–0.1)
EOS ABS: 0.1 10*3/uL (ref 0.0–0.5)
EOS%: 1.1 % (ref 0.0–7.0)
HEMATOCRIT: 36.2 % (ref 34.8–46.6)
HEMOGLOBIN: 12.4 g/dL (ref 11.6–15.9)
LYMPH%: 20.6 % (ref 14.0–49.7)
MCH: 29.4 pg (ref 25.1–34.0)
MCHC: 34.3 g/dL (ref 31.5–36.0)
MCV: 85.8 fL (ref 79.5–101.0)
MONO#: 0.4 10*3/uL (ref 0.1–0.9)
MONO%: 9.5 % (ref 0.0–14.0)
NEUT%: 68.6 % (ref 38.4–76.8)
NEUTROS ABS: 3.1 10*3/uL (ref 1.5–6.5)
PLATELETS: 175 10*3/uL (ref 145–400)
RBC: 4.22 10*6/uL (ref 3.70–5.45)
RDW: 13.7 % (ref 11.2–14.5)
WBC: 4.5 10*3/uL (ref 3.9–10.3)
lymph#: 0.9 10*3/uL (ref 0.9–3.3)

## 2016-04-19 MED ORDER — ANASTROZOLE 1 MG PO TABS: 1.0000 mg | ORAL_TABLET | Freq: Every day | ORAL | 2 refills | Status: DC

## 2016-04-19 NOTE — Progress Notes (Signed)
[name]  [phone] Fax:([phone]) [phone]  Clinic follow up Note   Patient Care Team: Susy Frizzle, MD as PCP - General (Family Medicine) Truitt Merle, MD as Consulting Physician (Oncology) Excell Seltzer, MD as Consulting Physician (General Surgery) Thea Silversmith, MD as Consulting Physician (Radiation Oncology) Sylvan Cheese, NP as Nurse Practitioner (Hematology and Oncology) 04/19/2016  CHIEF COMPLAINTS:  Follow up right breast cancer   Oncology History   Breast cancer of lower-inner quadrant of right female breast [facility])   Staging form: Breast, AJCC 7th Edition     Clinical stage from 01/02/2016: Stage IA (T1a, N0, M0) - Signed by Truitt Merle, MD on 01/14/2016     Pathologic stage from 01/27/2016: Stage IA (T1b, N0, cM0) - Signed by Truitt Merle, MD on 03/07/2016          Breast cancer of lower-inner quadrant of right female breast ([place])   12/29/2015 Mammogram    Diagnostic right mammogram showed a 2.3 x 2.1 x 2.2 cm group of coarse calcification within the medial slightly lower right breast.      01/02/2016 Initial Diagnosis    Breast cancer of lower-inner quadrant of right female breast ([place])      01/02/2016 Initial Biopsy    right breast LIQ core needle biopsy showed lobular carcinoma in situ, with a small focus of invasive lobular carcinoma, grade 2.       01/02/2016 Receptors her2    He had 10% positive, moderate staining. PR negative. Insufficient tissue for HER-2 testing.      01/27/2016 Surgery    Right breast lumpectomy and sentinel lymph node biopsy.      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      03/09/2016 - 04/19/2016 Radiation Therapy    Adjuvant breast radiation         HISTORY OF PRESENTING ILLNESS:  Krystal Brooks 60 y.o. female is here because of Her newly diagnosed right breast cancer. She is accompanied by her husband and of friend to our multidisciplinary rest clinic today.  This was found by screening mammogram,  she denies any palpable mass, skin change or nipple discharge, or ulcer constitutional symptoms before the screening.  Her prior mammo 6 years ago. She had a right breast cyst removed in 1979.   She has hypertension, mild asthma, otherwise pretty healthy. She feels very well, no symptoms. She lives with her husband, her daughter, son-in-law and their 6-year-old branch regimen. She takes of her current child at home. She does not exercise regularly, but is very physically active. No family history of breast cancer or offering cancer.  GYN HISTORY  Menarchal: 11 LMP: 1990 she has hysrectomy for cerical dysplasia  Contraceptive: 16 years  HRT: one year in 2008 G2P2: daughters 11 and 99, 3 grandchildern   CURRENT THERAPY: pending adjuvant anastrozole  INTERIM HISTORY: Krystal Brooks returns for follow-up. She received the last dose of breast radiation this morning. 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 She feels well overall, denies significant arthritis pain, or other symptoms. She is accompanied by her husband today.  MEDICAL HISTORY:  Past Medical History:  Diagnosis Date  . Allergy   . Anxiety   . Asthma   . Atherosclerosis of aorta (HCC)    bulge above renal arteries on mri 2016  . Cancer [facility]) 2017   right breast ca  . Complication of anesthesia   . Hypertension   . PONV (postoperative nausea and vomiting)   . Smoker     SURGICAL HISTORY: Past Surgical History:  Procedure Laterality Date  . ABDOMINAL HYSTERECTOMY    . BREAST LUMPECTOMY WITH RADIOACTIVE SEED AND SENTINEL  LYMPH NODE BIOPSY Right 01/27/2016   Procedure: BREAST LUMPECTOMY WITH RADIOACTIVE SEED AND SENTINEL LYMPH NODE BIOPSY;  Surgeon: Excell Seltzer, MD;  Location: [place];  Service: General;  Laterality: Right;  . BREAST SURGERY  1979   cyst removed rt breast  . CESAREAN SECTION     x2    SOCIAL HISTORY: Social History   Social History  . Marital status: Married    Spouse name: N/A  . Number of children: N/A  . Years of education: N/A   Occupational History  . Not on file.   Social History Main Topics  . Smoking status: Current Every Day Smoker    Packs/day: 0.50    Years: 40.00    Types: Cigarettes  . Smokeless tobacco: Never Used  . Alcohol use 0.0 oz/week     Comment: social  . Drug use: No  . Sexual activity: Yes    Birth control/ protection: Surgical     Comment: hysterectomy   Other Topics Concern  . Not on file   Social History Narrative  . No narrative on file   She is a retired Customer service manager  FAMILY HISTORY: Family History  Problem Relation Age of Onset  . Hearing loss Mother   . Asthma Father   . Cancer Father     melanoma, metastases   . Diabetes Father   . Heart disease Father   . Early death Sister   . Early death Paternal Grandfather   . Cancer Maternal Grandmother 88    colon cancer     ALLERGIES:  is allergic to penicillins and sulfa antibiotics.  MEDICATIONS:  Current Outpatient Prescriptions  Medication Sig Dispense Refill  . losartan-hydrochlorothiazide (HYZAAR) 50-12.5 MG tablet Take 2 tablets by mouth daily. 60 tablet 5  . montelukast (SINGULAIR) 10 MG tablet TAKE 1 TABLET BY MOUTH EVERY DAY 90 tablet 1  . non-metallic deodorant (ALRA) MISC Apply 1 application topically daily.    Marland Kitchen Umeclidinium-Vilanterol (ANORO ELLIPTA) 62.5-25 MCG/INH AEPB Inhale 1 puff into the lungs daily. 1 each 11  . VENTOLIN HFA 108 (90 Base) MCG/ACT inhaler USE 1-2 PUFFS EVERY 6 HOURS AS NEEDED FOR WHEEZING 18 Inhaler 3  . Wound Dressings  (SONAFINE EX) Apply 1 application topically 2 (two) times daily.     Marland Kitchen anastrozole (ARIMIDEX) 1 MG tablet Take 1 tablet (1 mg total) by mouth daily. 30 tablet 2   No current facility-administered medications for this visit.     REVIEW OF SYSTEMS:   Constitutional: Denies fevers, chills or abnormal night sweats Eyes: Denies blurriness of vision, double vision or watery eyes Ears, nose, mouth, throat, and face: Denies mucositis or sore throat Respiratory: Denies cough, dyspnea or wheezes Cardiovascular: Denies palpitation, chest discomfort or lower extremity swelling Gastrointestinal:  Denies nausea, heartburn or change in bowel habits Skin: Denies abnormal skin rashes Lymphatics: Denies new lymphadenopathy or easy bruising Neurological:Denies numbness, tingling or new weaknesses Behavioral/Psych:  Mood is stable, no new changes  All other systems were reviewed with the patient and are negative.  PHYSICAL EXAMINATION: ECOG PERFORMANCE STATUS: 0 - Asymptomatic  Vitals:   04/19/16 0846  BP: (!) 150/73  Pulse: (!) 102  Resp: 18  Temp: 97.9 F (36.6 C)   Filed Weights   04/19/16 0846  Weight: 133 lb (60.3 kg)    GENERAL:alert, no distress and comfortable SKIN: skin color, texture, turgor are normal, no rashes or significant lesions EYES: normal, conjunctiva are pink and non-injected, sclera clear OROPHARYNX:no exudate, no erythema and lips, buccal mucosa, and tongue normal  NECK: supple, thyroid normal size, non-tender, without nodularity LYMPH:  no palpable lymphadenopathy in the cervical, axillary or inguinal LUNGS: clear to auscultation and percussion with normal breathing effort HEART: regular rate & rhythm and no murmurs and no lower extremity edema ABDOMEN:abdomen soft, non-tender and normal bowel sounds Musculoskeletal:no cyanosis of digits and no clubbing  PSYCH: alert & oriented x 3 with fluent speech NEURO: no focal motor/sensory deficits Breasts: Breast inspection  showed them to be symmetrical with no nipple discharge. (+) Diffuse skin erythema and mild peeling in the right breast secondary to radiation. No open ulcer or blister. Palpation of the breasts and axilla revealed no obvious mass that I could appreciate.   LABORATORY DATA:  I have reviewed the data as listed Lab Results  Component Value Date   WBC 4.5 04/19/2016   HGB 12.4 04/19/2016   HCT 36.2 04/19/2016   MCV 85.8 04/19/2016   PLT 175 04/19/2016    Recent Labs  01/14/16 0844 04/19/16 0827  NA 133* 132*  K 3.4* 3.4*  CO2 31* 27  GLUCOSE 105 135  BUN 13.9 9.9  CREATININE 0.8 0.8  CALCIUM 9.6 9.5  PROT 7.4 7.4  ALBUMIN 3.8 3.7  AST 17 17  ALT 22 18  ALKPHOS 59 64  BILITOT 0.35 0.49   PATHOLOGY REPORT  Diagnosis 01/02/2016 Breast, right, needle core biopsy, lower, inner quadrant - INVASIVE MAMMARY CARCINOMA - 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 - SEE COMMENT. Microscopic Comment A smooth muscle myosin and calponin stain are performed on two blocks (4 stains total). A cytokeratin AE1/AE3 stain is also performed on a single block. The morphology coupled with the staining pattern is consistent with a very small focus of invasive mammary carcinoma in a background of mammary carcinoma in situ. An E-cadherin immunohistochemical stain will be performed to determine whether the tumor is ductal or lobular in nature (i.e. invasive ductal carcinoma with high grade ductal carcinoma in situ or pleomorphic invasive lobular carcinoma with lobular carcinoma in situ). The results will be reported in an addendum to follow. Although definitive grading of breast carcinoma is best done on excision, the features of the invasive tumor from the right lower inner quadrant breast needle core biopsy are compatible with a grade 2 breast carcinoma. A breast prognostic markers will be attempted (although the focus of invasive tumor is very small). The results of the profile will  also be reported in an addendum to follow. Findings are called to the [facility] on 01/06/16. Dr. Lyndon Code has seen this case in consultation with agreement. (RAH:gt, 01/06/16)  ADDENDUM: An E-cadherin immunohistochemical stain is performed. 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 Dr. Lyndon Code has seen the E-cadherin immunohistochemical stain in consultation with agreement of this assessment.  Results: IMMUNOHISTOCHEMICAL  AND MORPHOMETRIC ANALYSIS PERFORMED MANUALLY Estrogen Receptor: 10%, POSITIVE, MODERATE STAINING INTENSITY Progesterone Receptor: 0%, NEGATIVE Proliferation Marker Ki67: 10%  Results: HER2 - INSUFFICIENT: Not enough tumor cells for HER2 Analysis.  Diagnosis 01/27/2016 1. Breast, lumpectomy, Right - SCATTERED MICROSCOPIC FOCI OF INVASIVE LOBULAR CARCINOMA, PLEOMORPHIC VARIANT, GRADE II/III. - LOBULAR CARCINOMA IN SITU, PLEOMORPHIC VARIANT. - THE SURGICAL RESECTION MARGINS ARE NEGATIVE FOR INVASIVE CARCINOMA. - SEE ONCOLOGY TABLE BELOW. 2. Breast, excision, Right Additional Medial Margin - FIBROCYSTIC CHANGES WITH ADENOSIS AND CALCIFICATIONS. - HEALING BIOPSY SITE. - THERE IS NO EVIDENCE OF MALIGNANCY. - SEE COMMENT. 3. Lymph node, sentinel, biopsy, Right Axillary #1 - THERE IS NO EVIDENCE OF CARCINOMA IN 1 OF 1 LYMPH NODE (0/1). - SEE COMMENT. 4. Lymph node, sentinel, biopsy, Right Axillary #2 - THERE IS NO EVIDENCE OF CARCINOMA IN 1 OF 1 LYMPH NODE (0/1). - SEE COMMENT. Microscopic Comment 1. BREAST, INVASIVE TUMOR, WITH LYMPH NODES PRESENT Specimen, including laterality and lymph node sampling (sentinel, non-sentinel): Right breast and right axillary lymph nodes. Procedure: Seed localized lumpectomy, additional medial margin resection, and two axillary lymph node resections. Histologic type: Lobular, pleomorphic variant. Grade: 2 Tubule  formation: 3 Nuclear pleomorphism: 2 Mitotic:1 Tumor size (glass slide measurement): Estimated to be 0.6 cm, see comment. Margins: Invasive, distance to closest margin: Greater than 0.2 cm to all margins. Lymphovascular invasion: Not identified. 1 of 3 FINAL for KYLIA, GRAJALES L [phone]) Microscopic Comment(continued) Ductal carcinoma in situ: Not identified. Tumor focality: Unifocal, see comment. Treatment effect: N/A Extent of tumor: Confined to breast parenchyma. Lymph nodes: Examined: 2 Sentinel 0 Non-sentinel 2 Total Lymph nodes with metastasis: 0 Breast prognostic profile: Case [id number] Estrogen receptor: 10% moderate Progesterone receptor: 0% Her 2 neu: Insufficient invasive tumor Ki-67: 10% Non-neoplastic breast: Fibrocystic changes with adenosis and calcifications and healing biopsy site. TNM: pT1b, pN0 Comments: The vast majority of the nodular lesion consists of pleomorphic variant of lobular carcinoma in situ. However, there are a few scattered microscopic (all less than 0.1 cm) foci of invasive lobular carcinoma. As these foci are in contiguous tissue sections, and each section is approximately 0.3 cm, I believe the largest overall dimension of invasive tumor is up to 0.6 cm. Given that, I do not believe there is sufficient invasive tumor present in a single block to do additional studies, including Her 2 neu. (JBK:ecj 01/29/2016) 2. The surgical resection margin(s) of the specimen were inked and microscopically evaluated. . 3. and 4. Immunohistochemical stains performed on both parts #3 and #4 failed to highlight the presence of cytokeratin positive tumor cells. (JBK:ds 01/29/16) Enid Cutter MD   RADIOGRAPHIC STUDIES: I have personally reviewed the radiological images as listed and agreed with the findings in the report. No results found.  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 .  1. Right lower inner quadrant breast cancer, small focus of invasive lobular carcinoma, in the background of LCIS.  ER10%+, PR-, HER2 unknown  -I discussed her surgical path results with patient in great detail. -Majority of their breast cancer are LCIS, but there are scatted small component of invasive lobular carcinoma. Giving the negative margins, she is likely cured. -We discussed the risk of cancer recurrence, giving the very early stage disease, the risk of distant recurrence is likely low. She unfortunately did not have sufficient tumor tissues for HER-2 or Oncotype testing. -She will start adjuvant breast radiation soon. -Given the lobular histology, I  recommend her to consider adjuvant hormonal therapy with aromatase inhibitor. Her tumor has low ER positivity (10%), PR negative, so the benefit is probably moderate to low. --The potential benefit and side effects of AI, which includes but not limited to, hot flash, skin and vaginal dryness, metabolic changes ( increased blood glucose, cholesterol, weight, etc.), slightly in increased risk of cardiovascular disease, cataracts, muscular and joint discomfort, osteopenia and osteoporosis, etc, were discussed with her in great details. She voiced good understanding and agrees to proceed. -I cautery anastrozole 1 mg daily for her, she will start in 2 weeks -We also discussed breast cancer surveillance after she completes treatment, Including annual mammogram, breast exam every 6-12 months.  2. Bone health -She has not had bone density scan for several years, I'll obtain one in the next month as a baseline -I encouraged her to take calcium and vitamin D, she agrees. Burnis Medin check her vitamin D level on her next visit  3. HTN  -she will continue follow-up with her primary care physician   Plan -She will start Anastrozole in 2 weeks  -Return to clinic with lab in 2 months  -Bone density scan in one month   All  questions were answered. The patient knows to call the clinic with any problems, questions or concerns. I spent 25 minutes counseling the patient face to face. The total time spent in the appointment was 30 minutes and more than 50% was on counseling.     Truitt Merle, MD 04/19/2016

## 2016-04-19 NOTE — Telephone Encounter (Signed)
  Oncology Nurse Navigator Documentation  Navigator Location: [facility] (04/19/16 1600) Navigator Encounter Type: Telephone (04/19/16 1600) Telephone: [place] Call (04/19/16 1600)         Patient Visit Type: [id number] (04/19/16 1600) Treatment Phase: Final Radiation Tx (04/19/16 1600)                            Time Spent with Patient: 15 (04/19/16 1600)

## 2016-04-19 NOTE — Telephone Encounter (Signed)
Gave patient avs report and appointments for October. Patient also given appointment for bone density at [facility] 05/24/16.

## 2016-05-07 NOTE — Progress Notes (Signed)
  Radiation Oncology         [phone] ________________________________  Name: Krystal Brooks MRN: [id number]  Date: 04/19/2016  DOB: 1956-05-11  End of Treatment Note  Diagnosis:   Right-sided breast cancer     Indication for treatment:  Curative       Radiation treatment dates:   03/09/2016 through 04/19/2016  Site/dose:   The patient initially received a dose of 50 Gy in 25 fractions to the breast using whole-breast tangent fields. This was delivered using a 3-D conformal technique. The patient then received a boost to the seroma. This delivered an additional 10 Gy in 5 fractions using an en face electron field due to the depth of the seroma. The total dose was 60 Gy.  Narrative: The patient tolerated radiation treatment relatively well.   The patient had some expected skin irritation as she progressed during treatment. Moist desquamation was not present at the end of treatment.  Plan: The patient has completed radiation treatment. The patient will return to radiation oncology clinic for routine followup in one month. I advised the patient to call or return sooner if they have any questions or concerns related to their recovery or treatment. ________________________________  Jodelle Gross, M.D., Ph.D.

## 2016-05-24 ENCOUNTER — Ambulatory Visit
Admission: RE | Admit: 2016-05-24 | Discharge: 2016-05-24 | Disposition: A | Discharge status: Home / Self Care | Payer: Commercial Managed Care - HMO | Source: Ambulatory Visit | Attending: Hematology | Admitting: Hematology

## 2016-05-24 DIAGNOSIS — Z780 Asymptomatic menopausal state: Secondary | ICD-10-CM

## 2016-06-08 ENCOUNTER — Ambulatory Visit
Admission: RE | Admit: 2016-06-08 | Discharge: 2016-06-08 | Disposition: A | Discharge status: Home / Self Care | Payer: Commercial Managed Care - HMO | Source: Ambulatory Visit | Attending: Radiation Oncology | Admitting: Radiation Oncology

## 2016-06-08 ENCOUNTER — Encounter: Payer: Self-pay | Admitting: Radiation Oncology

## 2016-06-08 DIAGNOSIS — C50311 Malignant neoplasm of lower-inner quadrant of right female breast: Secondary | ICD-10-CM

## 2016-06-08 DIAGNOSIS — Z880 Allergy status to penicillin: Secondary | ICD-10-CM | POA: Insufficient documentation

## 2016-06-08 DIAGNOSIS — Z79899 Other long term (current) drug therapy: Secondary | ICD-10-CM | POA: Insufficient documentation

## 2016-06-08 DIAGNOSIS — I10 Essential (primary) hypertension: Secondary | ICD-10-CM | POA: Insufficient documentation

## 2016-06-08 DIAGNOSIS — M25512 Pain in left shoulder: Secondary | ICD-10-CM | POA: Insufficient documentation

## 2016-06-08 DIAGNOSIS — Z170 Estrogen receptor positive status [ER+]: Secondary | ICD-10-CM | POA: Insufficient documentation

## 2016-06-08 DIAGNOSIS — C50911 Malignant neoplasm of unspecified site of right female breast: Secondary | ICD-10-CM | POA: Diagnosis present

## 2016-06-08 NOTE — Progress Notes (Signed)
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 This was delivered using a 3-D conformal technique. The patient then received a boost to the seroma. This delivered an additional 10 Gy in 5 fractions using an en face electron field due to the depth of the seroma. The total dose was 60 Gy.  Narrative:  The patient returns today for routine follow-up.  She did well in tolerating radiotherapy and comes today for post treatment assessment. She is now using vitamin e oil for her skin twice a day and began an aromatase inhibitor about 3 weeks ago.                              On review of systems, the patient states she is not noticing hot flashes or night sweats. She has noticed some shoulder pain on the left but attributes this to carrying her granddaughter on this side. She continues to have some soreness in the right breast, and denies chest pain, shortness of breath, fevers or chills. She admits to a sunburn after going to the beach last week, but reports she did use sunscreen. No other complaints are noted.   ALLERGIES:  is allergic to penicillins and sulfa antibiotics.  Meds: Current Outpatient Prescriptions  Medication Sig Dispense Refill  . anastrozole (ARIMIDEX) 1 MG tablet Take 1 tablet (1 mg total) by mouth daily. 30 tablet 2  . losartan-hydrochlorothiazide (HYZAAR) 50-12.5 MG tablet Take 2 tablets by mouth daily. 60 tablet 5  . montelukast (SINGULAIR) 10 MG tablet TAKE 1 TABLET BY MOUTH EVERY DAY 90 tablet 1  .  Umeclidinium-Vilanterol (ANORO ELLIPTA) 62.5-25 MCG/INH AEPB Inhale 1 puff into the lungs daily. 1 each 11  . VENTOLIN HFA 108 (90 Base) MCG/ACT inhaler USE 1-2 PUFFS EVERY 6 HOURS AS NEEDED FOR WHEEZING 18 Inhaler 3   No current facility-administered medications for this encounter.     Physical Findings:  height is 5' 1"  (1.549 m) and weight is 132 lb 12.8 oz (60.2 kg). Her oral temperature is 98.5 F (36.9 C). Her blood pressure is 147/76 (abnormal) and her pulse is 100. Her respiration is 18 and oxygen saturation is 98%.  In general this is a well appearing Caucasian female in no acute distress. She's alert and oriented x4 and appropriate throughout the examination. Cardiopulmonary assessment is negative for acute distress and she exhibits normal effort. Her right breast reveals minimal hyperpigmentation. She does have a visible sunburn of the upper chest wall bilaterally with minimal blistering. No wet desquamation is noted.   Lab Findings: Lab Results  Component Value Date   WBC 4.5 04/19/2016   HGB 12.4 04/19/2016   HCT 36.2 04/19/2016   MCV 85.8 04/19/2016   PLT 175 04/19/2016     Radiographic Findings: Dg Bone Density  Result Date: 05/24/2016 EXAM: DUAL X-RAY ABSORPTIOMETRY (DXA) FOR BONE MINERAL DENSITY IMPRESSION: Referring Physician:  Truitt Brooks PATIENT: Name: Krystal Brooks Patient ID: [id number] Birth Date:  07/30/1956 Height: 60.7 in. Sex: Female Measured: 05/24/2016 Weight: 130.0 lbs. Indications: Arimidex, Breast Cancer History, Caucasian, Estrogen Deficient, Height Loss (781.91), High risk medication use, Hx of tobacco use, Hysterectomy, Low Calcium Intake (269.3), Postmenopausal Fractures: None Treatments: Multivitamin ASSESSMENT: The BMD measured at Femur Neck Right is 0.958 g/cm2 with a T-score of -0.6. This patient is considered normal according to [facility]) criteria. Patient does not meet criteria for FRAX assessment. Site Region Measured Date Measured  Age YA BMD Significant CHANGE T-score AP Spine  L1-L4      4/24[age]    61.4         -0.6    1.124 g/cm2 DualFemur Neck Right 05/24/2016    61.4         -0.6    0.958 g/cm2 World Health Organization [facility]) criteria for post-menopausal, Caucasian Women: Normal       T-score at or above -1 SD Osteopenia   T-score between -1 and -2.5 SD Osteoporosis T-score at or below -2.5 SD RECOMMENDATION: [place] recommends that FDA-approved medical therapies be considered in postmenopausal women and men age 65 or older with a: 1. Hip or vertebral (clinical or morphometric) fracture. 2. T-score of <-2.5 at the spine or hip. 3. Ten-year fracture probability by FRAX of 3% or greater for hip fracture or 20% or greater for major osteoporotic fracture. All treatment decisions require clinical judgment and consideration of individual patient factors, including patient preferences, co-morbidities, previous drug use, risk factors not captured in the FRAX model (e.g. falls, vitamin D deficiency, increased bone turnover, interval significant decline in bone density) and possible under - or over-estimation of fracture risk by FRAX. All patients should ensure an adequate intake of dietary calcium (1200 mg/d) and vitamin D (800 IU daily) unless contraindicated. FOLLOW-UP: People with diagnosed cases of osteoporosis or at high risk for fracture should have regular bone mineral density tests. For patients eligible for Medicare, routine testing is allowed once every 2 years. The testing frequency can be increased to one year for patients who have rapidly progressing disease, those who are receiving or discontinuing medical therapy to restore bone mass, or have additional risk factors. I have reviewed this report, and agree with the above findings. [facility] Electronically Signed   By: Lahoma Crocker M.D.   On: 05/24/2016 11:28    Impression/Plan: 1.  Stage IA, T1b, N0 Invasive lobular carcinoma in the backgound  of LCIS, ER positive, HER2 unknown of the right breast. The patient has been seen by Dr. Burr Medico. She is doing well since completion of treatment, and is now taking an aromatase inhibitor. She will continue under the care of Dr. Burr Medico for management of this and we would be happy to see her back in the future as needed if she has questions or concerns regarding previous treatment. 2. Survivorship. The patient has an appointment on 07/12/16 with Mike Craze, NP. I have encouraged her to attend this appointment and discussed the role of survivorship with the patient. 3. HTN. The patient's blood pressure is elevated today and she admits to some social issues that are driving this. She is otherwise asymptomatic and will have this rechecked next Monday when she sees Dr. Burr Medico. 4. Sunburn. I have encouraged her to avoid sun exposure and we had discussed this previously at her last visit about the importance of high SPF sunscreen. She states an understanding today and reports she will continue to use this.     Carola Rhine,  PAC

## 2016-06-08 NOTE — Progress Notes (Addendum)
Mrs. Krystal Brooks is her for a one month follow up visit for right breast cancer.  Skin status:Normal color to right breast. Upper chest is sun tan from a recent beach trip. What lotion are you using? Using lotion with vitamin E to right breast. Have you seen med onc? If not, when is appointment? Dr. Burr Medico 06-14-16 If they are ER+, have they started Al or Tamoxifen? If not, why?  05-29-16 [place]  Discuss survivorship appointment.07-12-16 Mike Craze, P.A. Have you had a mammogram scheduled? No Offer referral to [facility]. Will receive  Day of survivorship appointment Appetite:Too good Pain:No Fatigue:Having fatigue in late afternoon; babysitting grandchildren ages 53 months and 29 year old. Arm mobility:Able to raise right arm without difficulty. Wt Readings from Last 3 Encounters:  06/08/16 132 lb 12.8 oz (60.2 kg)  04/19/16 133 lb (60.3 kg)  04/09/16 133 lb 9.6 oz (60.6 kg)   BP (!) 172/105 (BP Location: Left Arm, Patient Position: Sitting, Cuff Size: Normal)   Pulse (!) 103   Temp 98.5 F (36.9 C) (Oral)   Resp 18   Ht 5\' 1"  (1.549 m)   Wt 132 lb 12.8 oz (60.2 kg)   SpO2 98%   BMI 25.09 kg/m   1005 147/76 Very anxious had a situation happen before her appointment this morning.

## 2016-06-14 ENCOUNTER — Other Ambulatory Visit: Payer: Self-pay | Admitting: *Deleted

## 2016-06-14 ENCOUNTER — Ambulatory Visit (HOSPITAL_BASED_OUTPATIENT_CLINIC_OR_DEPARTMENT_OTHER): Payer: Commercial Managed Care - HMO | Admitting: Hematology

## 2016-06-14 ENCOUNTER — Encounter: Payer: Self-pay | Admitting: Hematology

## 2016-06-14 ENCOUNTER — Other Ambulatory Visit (HOSPITAL_BASED_OUTPATIENT_CLINIC_OR_DEPARTMENT_OTHER): Payer: Commercial Managed Care - HMO

## 2016-06-14 VITALS — BP 150/79 | HR 89 | Temp 98.1°F | Resp 18 | Ht 61.0 in | Wt 130.1 lb

## 2016-06-14 DIAGNOSIS — C50311 Malignant neoplasm of lower-inner quadrant of right female breast: Secondary | ICD-10-CM

## 2016-06-14 DIAGNOSIS — I10 Essential (primary) hypertension: Secondary | ICD-10-CM | POA: Insufficient documentation

## 2016-06-14 DIAGNOSIS — Z170 Estrogen receptor positive status [ER+]: Secondary | ICD-10-CM | POA: Diagnosis not present

## 2016-06-14 DIAGNOSIS — Z780 Asymptomatic menopausal state: Secondary | ICD-10-CM

## 2016-06-14 DIAGNOSIS — E876 Hypokalemia: Secondary | ICD-10-CM

## 2016-06-14 LAB — CBC WITH DIFFERENTIAL/PLATELET
BASO%: 0.9 % (ref 0.0–2.0)
Basophils Absolute: 0 10*3/uL (ref 0.0–0.1)
EOS ABS: 0.1 10*3/uL (ref 0.0–0.5)
EOS%: 1.7 % (ref 0.0–7.0)
HCT: 36.8 % (ref 34.8–46.6)
HEMOGLOBIN: 12.5 g/dL (ref 11.6–15.9)
LYMPH%: 18.9 % (ref 14.0–49.7)
MCH: 29.5 pg (ref 25.1–34.0)
MCHC: 33.9 g/dL (ref 31.5–36.0)
MCV: 87.1 fL (ref 79.5–101.0)
MONO#: 0.4 10*3/uL (ref 0.1–0.9)
MONO%: 9.3 % (ref 0.0–14.0)
NEUT%: 69.2 % (ref 38.4–76.8)
NEUTROS ABS: 3 10*3/uL (ref 1.5–6.5)
PLATELETS: 197 10*3/uL (ref 145–400)
RBC: 4.22 10*6/uL (ref 3.70–5.45)
RDW: 13.8 % (ref 11.2–14.5)
WBC: 4.3 10*3/uL (ref 3.9–10.3)
lymph#: 0.8 10*3/uL — ABNORMAL LOW (ref 0.9–3.3)

## 2016-06-14 LAB — COMPREHENSIVE METABOLIC PANEL
ALBUMIN: 3.7 g/dL (ref 3.5–5.0)
ALK PHOS: 64 U/L (ref 40–150)
ALT: 19 U/L (ref 0–55)
ANION GAP: 11 meq/L (ref 3–11)
AST: 18 U/L (ref 5–34)
BILIRUBIN TOTAL: 0.43 mg/dL (ref 0.20–1.20)
BUN: 10.6 mg/dL (ref 7.0–26.0)
CALCIUM: 9.5 mg/dL (ref 8.4–10.4)
CO2: 27 mEq/L (ref 22–29)
Chloride: 96 mEq/L — ABNORMAL LOW (ref 98–109)
Creatinine: 0.7 mg/dL (ref 0.6–1.1)
EGFR: 89 mL/min/{1.73_m2} — AB (ref >90)
Glucose: 83 mg/dl (ref 70–140)
Potassium: 3.1 mEq/L — ABNORMAL LOW (ref 3.5–5.1)
Sodium: 134 mEq/L — ABNORMAL LOW (ref 136–145)
TOTAL PROTEIN: 7.4 g/dL (ref 6.4–8.3)

## 2016-06-14 MED ORDER — ANASTROZOLE 1 MG PO TABS: 1.0000 mg | ORAL_TABLET | Freq: Every day | ORAL | 5 refills | Status: DC

## 2016-06-14 MED ORDER — POTASSIUM CHLORIDE CRYS ER 20 MEQ PO TBCR: 20.0000 meq | EXTENDED_RELEASE_TABLET | Freq: Every day | ORAL | 0 refills | Status: DC

## 2016-06-14 NOTE — Progress Notes (Signed)
[place]  [phone] Fax:([phone]) [phone]  Clinic follow up Note   Patient Care Team: Susy Frizzle, MD as PCP - General (Family Medicine) Truitt Merle, MD as Consulting Physician (Oncology) Excell Seltzer, MD as Consulting Physician (General Surgery) Thea Silversmith, MD as Consulting Physician (Radiation Oncology) Sylvan Cheese, NP as Nurse Practitioner (Hematology and Oncology) 06/14/2016  CHIEF COMPLAINTS:  Follow up right breast cancer   Oncology History   Breast cancer of lower-inner quadrant of right female breast [facility])   Staging form: Breast, AJCC 7th Edition     Clinical stage from 01/02/2016: Stage IA (T1a, N0, M0) - Signed by Truitt Merle, MD on 01/14/2016     Pathologic stage from 01/27/2016: Stage IA (T1b, N0, cM0) - Signed by Truitt Merle, MD on 03/07/2016          Breast cancer of lower-inner quadrant of right female breast ([place])   12/29/2015 Mammogram    Diagnostic right mammogram showed a 2.3 x 2.1 x 2.2 cm group of coarse calcification within the medial slightly lower right breast.      01/02/2016 Initial Diagnosis    Breast cancer of lower-inner quadrant of right female breast ([place])      01/02/2016 Initial Biopsy    right breast LIQ core needle biopsy showed lobular carcinoma in situ, with a small focus of invasive lobular carcinoma, grade 2.       01/02/2016 Receptors her2    He had 10% positive, moderate staining. PR negative. Insufficient tissue for HER-2 testing.      01/27/2016 Surgery    Right breast lumpectomy and sentinel lymph node biopsy.      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      03/09/2016 - 04/19/2016 Radiation Therapy    Adjuvant breast radiation         HISTORY OF PRESENTING ILLNESS:  Krystal Brooks 60 y.o. female is here because of Her newly diagnosed right breast cancer. She is accompanied by her husband and of friend to our multidisciplinary rest clinic today.  This was found by screening mammogram,  she denies any palpable mass, skin change or nipple discharge, or ulcer constitutional symptoms before the screening.  Her prior mammo 6 years ago. She had a right breast cyst removed in 1979.   She has hypertension, mild asthma, otherwise pretty healthy. She feels very well, no symptoms. She lives with her husband, her daughter, son-in-law and their 34-year-old branch regimen. She takes of her current child at home. She does not exercise regularly, but is very physically active. No family history of breast cancer or offering cancer.  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 She started anastrozole in early September 2017, has been tolerating well so far. She had a some shoulder and hip pain last year from arthritis, resolved after exercise and physical therapy. She noticed a left shoulder and left hip pain  I can daily, she has been taking her 23 month old granddaughter, and holds her with left hand a lot. Overall the joint pain is mild, tolerable, she does not take any pain medication. She has mild hot flash, tolerable, no other side effects from medication. She still has intermittent right breast tenderness and shooting pain. She has good appetite and eating well, weight is stable.  MEDICAL HISTORY:  Past Medical History:  Diagnosis Date  . Allergy   . Anxiety   . Asthma   . Atherosclerosis of aorta (HCC)    bulge above  renal arteries on mri 2016  . Cancer [facility]) 2017   right breast ca  . Complication of anesthesia   . Hypertension   . PONV (postoperative nausea and vomiting)   . Smoker     SURGICAL HISTORY: Past Surgical History:  Procedure Laterality Date  . ABDOMINAL HYSTERECTOMY    . BREAST LUMPECTOMY WITH RADIOACTIVE SEED AND SENTINEL LYMPH NODE BIOPSY Right 01/27/2016   Procedure: BREAST LUMPECTOMY WITH RADIOACTIVE SEED AND SENTINEL LYMPH NODE BIOPSY;  Surgeon: Excell Seltzer, MD;  Location: [facility];  Service: General;  Laterality: Right;  . BREAST SURGERY  1979   cyst removed rt breast  . CESAREAN SECTION     x2    SOCIAL HISTORY: Social History   Social History  . Marital status: Married    Spouse name: N/A  . Number of children: N/A  . Years of education: N/A   Occupational History  . Not on file.   Social History Main Topics  . Smoking status: Current Every Day Smoker    Packs/day: 0.50    Years: 40.00    Types: Cigarettes  . Smokeless tobacco: Never Used  . Alcohol use 0.0 oz/week     Comment: social  . Drug use: No  . Sexual activity: Yes    Birth control/ protection: Surgical     Comment: hysterectomy   Other Topics Concern  . Not on file   Social History Narrative  . No narrative on file   She is a retired Customer service manager  FAMILY HISTORY: Family History  Problem Relation Age of Onset  . Hearing loss Mother   . Asthma Father   . Cancer Father     melanoma, metastases   . Diabetes Father   . Heart disease Father   . Early death Sister   . Early death Paternal Grandfather   . Cancer Maternal Grandmother 88    colon cancer     ALLERGIES:  is allergic to penicillins and sulfa antibiotics.  MEDICATIONS:  Current Outpatient Prescriptions  Medication Sig Dispense Refill  . anastrozole (ARIMIDEX) 1 MG tablet Take 1 tablet (1 mg total) by mouth daily. 30 tablet 5  . losartan-hydrochlorothiazide (HYZAAR) 50-12.5 MG tablet Take 2 tablets by mouth  daily. 60 tablet 5  . montelukast (SINGULAIR) 10 MG tablet TAKE 1 TABLET BY MOUTH EVERY DAY 90 tablet 1  . Umeclidinium-Vilanterol (ANORO ELLIPTA) 62.5-25 MCG/INH AEPB Inhale 1 puff into the lungs daily. 1 each 11  . VENTOLIN HFA 108 (90 Base) MCG/ACT inhaler USE 1-2 PUFFS EVERY 6 HOURS AS NEEDED FOR WHEEZING 18 Inhaler 3   No current facility-administered medications for this visit.     REVIEW OF SYSTEMS:   Constitutional: Denies fevers, chills or abnormal night sweats Eyes: Denies blurriness of vision, double vision or watery eyes Ears, nose, mouth, throat, and face: Denies mucositis or sore throat Respiratory: Denies cough, dyspnea or wheezes Cardiovascular:  Denies palpitation, chest discomfort or lower extremity swelling Gastrointestinal:  Denies nausea, heartburn or change in bowel habits Skin: Denies abnormal skin rashes Lymphatics: Denies new lymphadenopathy or easy bruising Neurological:Denies numbness, tingling or new weaknesses Behavioral/Psych: Mood is stable, no new changes  All other systems were reviewed with the patient and are negative.  PHYSICAL EXAMINATION: ECOG PERFORMANCE STATUS: 0 - Asymptomatic  Vitals:   06/14/16 0847  BP: (!) 150/79  Pulse: 89  Resp: 18  Temp: 98.1 F (36.7 C)   Filed Weights   06/14/16 0847  Weight: 130 lb 1.6 oz (59 kg)    GENERAL:alert, no distress and comfortable SKIN: skin color, texture, turgor are normal, no rashes or significant lesions EYES: normal, conjunctiva are pink and non-injected, sclera clear OROPHARYNX:no exudate, no erythema and lips, buccal mucosa, and tongue normal  NECK: supple, thyroid normal size, non-tender, without nodularity LYMPH:  no palpable lymphadenopathy in the cervical, axillary or inguinal LUNGS: clear to auscultation and percussion with normal breathing effort HEART: regular rate & rhythm and no murmurs and no lower extremity edema ABDOMEN:abdomen soft, non-tender and normal bowel  sounds Musculoskeletal:no cyanosis of digits and no clubbing  PSYCH: alert & oriented x 3 with fluent speech NEURO: no focal motor/sensory deficits Breasts: Breast inspection showed them to be symmetrical with no nipple discharge. (+) Mild skin pigmentation of the right breast secondary to radiation. No open ulcer or blister. Palpation of the breasts and axilla revealed a possible small lump in the right upper outer quadrant of right breast, surgical scar in the axilla and in the left breast has healed well, mild tenderness, no other obvious mass that I could appreciate.   LABORATORY DATA:  I have reviewed the data as listed Lab Results  Component Value Date   WBC 4.3 06/14/2016   HGB 12.5 06/14/2016   HCT 36.8 06/14/2016   MCV 87.1 06/14/2016   PLT 197 06/14/2016    Recent Labs  01/14/16 0844 04/19/16 0827 06/14/16 0828  NA 133* 132* 134*  K 3.4* 3.4* 3.1*  CO2 31* 27 27  GLUCOSE 105 135 83  BUN 13.9 9.9 10.6  CREATININE 0.8 0.8 0.7  CALCIUM 9.6 9.5 9.5  PROT 7.4 7.4 7.4  ALBUMIN 3.8 3.7 3.7  AST 17 17 18   ALT 22 18 19   ALKPHOS 59 64 64  BILITOT 0.35 0.49 0.43   PATHOLOGY REPORT  Diagnosis 01/02/2016 Breast, right, needle core biopsy, lower, inner quadrant - INVASIVE MAMMARY CARCINOMA - 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 - SEE COMMENT. Microscopic Comment A smooth muscle myosin and calponin stain are performed on two blocks (4 stains total). A cytokeratin AE1/AE3 stain is also performed on a single block. The morphology coupled with the staining pattern is consistent with a very small focus of invasive mammary carcinoma in a background of mammary carcinoma in situ. An E-cadherin immunohistochemical stain will be performed to determine whether the tumor is ductal or lobular in nature (i.e. invasive ductal carcinoma with high grade ductal carcinoma in situ or pleomorphic invasive lobular carcinoma with lobular carcinoma in situ). The results will  be reported in an addendum to follow. Although definitive grading of breast carcinoma is best done on excision, the features of the invasive tumor from the right lower inner quadrant breast needle core biopsy are compatible with a grade 2 breast carcinoma. A breast prognostic markers will be attempted (although the focus of invasive tumor is very small). The results of the profile will also be  reported in an addendum to follow. Findings are called to the [place] on 01/06/16. Dr. Lyndon Code has seen this case in consultation with agreement. (RAH:gt, 01/06/16)  ADDENDUM: An E-cadherin immunohistochemical stain is performed. 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 Dr. Lyndon Code has seen the E-cadherin immunohistochemical stain in consultation with agreement of this assessment.  Results: IMMUNOHISTOCHEMICAL AND MORPHOMETRIC ANALYSIS PERFORMED MANUALLY Estrogen Receptor: 10%, POSITIVE, MODERATE STAINING INTENSITY Progesterone Receptor: 0%, NEGATIVE Proliferation Marker Ki67: 10%  Results: HER2 - INSUFFICIENT: Not enough tumor cells for HER2 Analysis.  Diagnosis 01/27/2016 1. Breast, lumpectomy, Right - SCATTERED MICROSCOPIC FOCI OF INVASIVE LOBULAR CARCINOMA, PLEOMORPHIC VARIANT, GRADE II/III. - LOBULAR CARCINOMA IN SITU, PLEOMORPHIC VARIANT. - THE SURGICAL RESECTION MARGINS ARE NEGATIVE FOR INVASIVE CARCINOMA. - SEE ONCOLOGY TABLE BELOW. 2. Breast, excision, Right Additional Medial Margin - FIBROCYSTIC CHANGES WITH ADENOSIS AND CALCIFICATIONS. - HEALING BIOPSY SITE. - THERE IS NO EVIDENCE OF MALIGNANCY. - SEE COMMENT. 3. Lymph node, sentinel, biopsy, Right Axillary #1 - THERE IS NO EVIDENCE OF CARCINOMA IN 1 OF 1 LYMPH NODE (0/1). - SEE COMMENT. 4. Lymph node, sentinel, biopsy, Right Axillary #2 - THERE IS NO EVIDENCE OF CARCINOMA IN 1 OF 1 LYMPH NODE (0/1). - SEE  COMMENT. Microscopic Comment 1. BREAST, INVASIVE TUMOR, WITH LYMPH NODES PRESENT Specimen, including laterality and lymph node sampling (sentinel, non-sentinel): Right breast and right axillary lymph nodes. Procedure: Seed localized lumpectomy, additional medial margin resection, and two axillary lymph node resections. Histologic type: Lobular, pleomorphic variant. Grade: 2 Tubule formation: 3 Nuclear pleomorphism: 2 Mitotic:1 Tumor size (glass slide measurement): Estimated to be 0.6 cm, see comment. Margins: Invasive, distance to closest margin: Greater than 0.2 cm to all margins. Lymphovascular invasion: Not identified. 1 of 3 FINAL for Krystal, Krystal Brooks [phone]) Microscopic Comment(continued) Ductal carcinoma in situ: Not identified. Tumor focality: Unifocal, see comment. Treatment effect: N/A Extent of tumor: Confined to breast parenchyma. Lymph nodes: Examined: 2 Sentinel 0 Non-sentinel 2 Total Lymph nodes with metastasis: 0 Breast prognostic profile: Case [id number] Estrogen receptor: 10% moderate Progesterone receptor: 0% Her 2 neu: Insufficient invasive tumor Ki-67: 10% Non-neoplastic breast: Fibrocystic changes with adenosis and calcifications and healing biopsy site. TNM: pT1b, pN0 Comments: The vast majority of the nodular lesion consists of pleomorphic variant of lobular carcinoma in situ. However, there are a few scattered microscopic (all less than 0.1 cm) foci of invasive lobular carcinoma. As these foci are in contiguous tissue sections, and each section is approximately 0.3 cm, I believe the largest overall dimension of invasive tumor is up to 0.6 cm. Given that, I do not believe there is sufficient invasive tumor present in a single block to do additional studies, including Her 2 neu. (JBK:ecj 01/29/2016) 2. The surgical resection margin(s) of the specimen were inked and microscopically evaluated. . 3. and 4. Immunohistochemical stains performed on both  parts #3 and #4 failed to highlight the presence of cytokeratin positive tumor cells. (JBK:ds 01/29/16) Enid Cutter MD   RADIOGRAPHIC STUDIES: I have personally reviewed the radiological images as listed and agreed with the findings in the report. Dg Bone Density  Result Date: 05/24/2016 EXAM: DUAL X-RAY ABSORPTIOMETRY (DXA) FOR BONE MINERAL DENSITY IMPRESSION: Referring Physician:  Truitt Merle PATIENT: Name: Krystal, Brooks Patient ID: [id number] Birth Date: Aug 24, 1956 Height: 60.7 in. Sex: Female Measured: 05/24/2016 Weight: 130.0 lbs. Indications: Arimidex, Breast Cancer History, Caucasian, Estrogen Deficient, Height Loss (  781.91), High risk medication use, Hx of tobacco use, Hysterectomy, Low Calcium Intake (269.3), Postmenopausal Fractures: None Treatments: Multivitamin ASSESSMENT: The BMD measured at Femur Neck Right is 0.958 g/cm2 with a T-score of -0.6. This patient is considered normal according to [facility]) criteria. Patient does not meet criteria for FRAX assessment. Site Region Measured Date Measured Age YA BMD Significant CHANGE T-score AP Spine  L1-L4      4/24/2016    61.4         -0.6    1.124 g/cm2 DualFemur Neck Right 05/24/2016    61.4         -0.6    0.958 g/cm2 World Health Organization [facility]) criteria for post-menopausal, Caucasian Women: Normal       T-score at or above -1 SD Osteopenia   T-score between -1 and -2.5 SD Osteoporosis T-score at or below -2.5 SD RECOMMENDATION: [employer] recommends that FDA-approved medical therapies be considered in postmenopausal women and men age 35 or older with a: 1. Hip or vertebral (clinical or morphometric) fracture. 2. T-score of <-2.5 at the spine or hip. 3. Ten-year fracture probability by FRAX of 3% or greater for hip fracture or 20% or greater for major osteoporotic fracture. All treatment decisions require clinical judgment and consideration of individual patient factors, including patient preferences,  co-morbidities, previous drug use, risk factors not captured in the FRAX model (e.g. falls, vitamin D deficiency, increased bone turnover, interval significant decline in bone density) and possible under - or over-estimation of fracture risk by FRAX. All patients should ensure an adequate intake of dietary calcium (1200 mg/d) and vitamin D (800 IU daily) unless contraindicated. FOLLOW-UP: People with diagnosed cases of osteoporosis or at high risk for fracture should have regular bone mineral density tests. For patients eligible for Medicare, routine testing is allowed once every 2 years. The testing frequency can be increased to one year for patients who have rapidly progressing disease, those who are receiving or discontinuing medical therapy to restore bone mass, or have additional risk factors. I have reviewed this report, and agree with the above findings. [facility] Radiology Electronically Signed   By: Lahoma Crocker M.D.   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 .  1. Right lower inner quadrant breast cancer, small focus of invasive lobular carcinoma, in the background of LCIS.  ER10%+, PR-, HER2 unknown  -I discussed her surgical path results with patient in great detail. -Majority of their breast cancer are LCIS, but there are scatted small component of invasive lobular carcinoma. Giving the negative margins, she is likely cured. -We discussed the risk of cancer recurrence, giving the very early stage disease, the risk of distant recurrence is likely low. She unfortunately did not have sufficient tumor tissues for HER-2 or Oncotype testing. -She will start adjuvant breast radiation soon. -Given the lobular histology, I recommend her to consider adjuvant hormonal therapy with aromatase inhibitor. Her tumor has low ER positivity (10%), PR negative, so the benefit is probably  moderate to low. -She has started adjuvant anastrozole, tolerating well, except mild arthralgia, we'll continue. I encouraged her to exercise -breast cancer surveillance after she completes treatment, Including annual mammogram, breast exam every 6-12 months.  2. Bone health -Her recent bone density scan showed a normal bone density -We discussed anastrozole may weak her bone -I encouraged her to continue exercise, take calcium  and a vitamin D  3. HTN  -she will continue follow-up with her primary care physician  4. Arthralgia -She has baseline arthralgia, slightly worse after taking anastrozole -I encouraged her to exercise regularly, OK to use Tylenol or Motrin as needed for pain  5. Hypokalemia -Probably related to her HCTZ -I'll give her a prescription of potassium -I recommend her to follow-up with her primary care physician  Plan -continue t Anastrozole in 2 weeks  -Right breast diagnostic mammogram in 2-3 weeks -I'll see her back in 3 months with lab  All questions were answered. The patient knows to call the clinic with any problems, questions or concerns.  I spent 25 minutes counseling the patient face to face. The total time spent in the appointment was 30 minutes and more than 50% was on counseling.     Truitt Merle, MD 06/14/2016

## 2016-06-15 ENCOUNTER — Other Ambulatory Visit: Payer: Self-pay | Admitting: Hematology

## 2016-06-15 DIAGNOSIS — N6310 Unspecified lump in the right breast, unspecified quadrant: Secondary | ICD-10-CM

## 2016-06-15 LAB — VITAMIN D 25 HYDROXY (VIT D DEFICIENCY, FRACTURES): VIT D 25 HYDROXY: 36.5 ng/mL (ref 30.0–100.0)

## 2016-06-21 ENCOUNTER — Ambulatory Visit
Admission: RE | Admit: 2016-06-21 | Discharge: 2016-06-21 | Disposition: A | Discharge status: Home / Self Care | Payer: Commercial Managed Care - HMO | Source: Ambulatory Visit | Attending: Hematology | Admitting: Hematology

## 2016-06-21 DIAGNOSIS — C50311 Malignant neoplasm of lower-inner quadrant of right female breast: Secondary | ICD-10-CM

## 2016-06-21 DIAGNOSIS — Z170 Estrogen receptor positive status [ER+]: Principal | ICD-10-CM

## 2016-06-21 DIAGNOSIS — N6310 Unspecified lump in the right breast, unspecified quadrant: Secondary | ICD-10-CM

## 2016-06-24 ENCOUNTER — Telehealth: Payer: Self-pay | Admitting: *Deleted

## 2016-06-24 NOTE — Telephone Encounter (Signed)
-----   Message from Truitt Merle, MD sent at 06/21/2016 11:18 PM EDT ----- Please let her mammogram result, thanks.  Truitt Merle

## 2016-06-24 NOTE — Telephone Encounter (Signed)
Message left on pt's home vm that result of mammogram benign & to call if she has questions.

## 2016-07-06 ENCOUNTER — Telehealth: Payer: Self-pay | Admitting: General Practice

## 2016-07-06 NOTE — Telephone Encounter (Signed)
Spoke with patient confirmed January 2018 appointment.

## 2016-07-09 ENCOUNTER — Other Ambulatory Visit: Payer: Self-pay | Admitting: Family Medicine

## 2016-07-12 ENCOUNTER — Encounter: Payer: Commercial Managed Care - HMO | Admitting: Adult Health

## 2016-07-17 ENCOUNTER — Encounter: Payer: Self-pay | Admitting: Adult Health

## 2016-08-03 ENCOUNTER — Encounter: Payer: Self-pay | Admitting: Family Medicine

## 2016-08-03 ENCOUNTER — Ambulatory Visit (INDEPENDENT_AMBULATORY_CARE_PROVIDER_SITE_OTHER): Payer: Commercial Managed Care - HMO | Admitting: Family Medicine

## 2016-08-03 VITALS — HR 98 | Temp 98.2°F | Resp 16 | Ht 61.0 in | Wt 130.0 lb

## 2016-08-03 DIAGNOSIS — M25512 Pain in left shoulder: Secondary | ICD-10-CM | POA: Diagnosis not present

## 2016-08-03 NOTE — Progress Notes (Signed)
Subjective:    Patient ID: Krystal Brooks, female    DOB: 05/13/1956, 60 y.o.   MRN: [id number]  HPI Patient symptoms began in September. She reports pain in her left shoulder. Pain is worse with abduction. She has a positive empty can sign. She has a positive Hawkins sign. She has pain with internal and sternal rotation. She denies any falls or injuries. She does have a past medical history of breast cancer in the right breast status post surgical excision and localized radiation. She denies any fevers or chills or diffuse bone pain Past Medical History:  Diagnosis Date  . Allergy   . Anxiety   . Asthma   . Atherosclerosis of aorta (HCC)    bulge above renal arteries on mri 2016  . Cancer [facility]) 2017   right breast ca  . Complication of anesthesia   . Hypertension   . PONV (postoperative nausea and vomiting)   . Smoker    Past Surgical History:  Procedure Laterality Date  . ABDOMINAL HYSTERECTOMY    . BREAST LUMPECTOMY WITH RADIOACTIVE SEED AND SENTINEL LYMPH NODE BIOPSY Right 01/27/2016   Procedure: BREAST LUMPECTOMY WITH RADIOACTIVE SEED AND SENTINEL LYMPH NODE BIOPSY;  Surgeon: Excell Seltzer, MD;  Location: [facility];  Service: General;  Laterality: Right;  . BREAST SURGERY  1979   cyst removed rt breast  . CESAREAN SECTION     x2   Current Outpatient Prescriptions on File Prior to Visit  Medication Sig Dispense Refill  . anastrozole (ARIMIDEX) 1 MG tablet Take 1 tablet (1 mg total) by mouth daily. 30 tablet 5  . losartan-hydrochlorothiazide (HYZAAR) 50-12.5 MG tablet TAKE 2 TABLETS BY MOUTH DAILY. 60 tablet 5  . montelukast (SINGULAIR) 10 MG tablet TAKE 1 TABLET BY MOUTH EVERY DAY 90 tablet 1  . potassium chloride SA (K-DUR,KLOR-CON) 20 MEQ tablet Take 1 tablet (20 mEq total) by mouth daily. 30 tablet 0  . Umeclidinium-Vilanterol (ANORO ELLIPTA) 62.5-25 MCG/INH AEPB Inhale 1 puff into the lungs daily. 1 each 11  . VENTOLIN HFA 108 (90 Base) MCG/ACT inhaler  USE 1-2 PUFFS EVERY 6 HOURS AS NEEDED FOR WHEEZING 18 Inhaler 3   No current facility-administered medications on file prior to visit.    Allergies  Allergen Reactions  . Penicillins Swelling and Rash    Older sister died from reaction to penicillin  . Sulfa Antibiotics Rash   Social History   Social History  . Marital status: Married    Spouse name: N/A  . Number of children: N/A  . Years of education: N/A   Occupational History  . Not on file.   Social History Main Topics  . Smoking status: Former Smoker    Packs/day: 0.50    Years: 40.00    Types: Cigarettes  . Smokeless tobacco: Never Used  . Alcohol use 0.0 oz/week     Comment: social  . Drug use: No  . Sexual activity: Yes    Birth control/ protection: Surgical     Comment: hysterectomy   Other Topics Concern  . Not on file   Social History Narrative  . No narrative on file      Review of Systems  All other systems reviewed and are negative.      Objective:   Physical Exam  Constitutional: She appears well-developed and well-nourished.  Cardiovascular: Normal rate, regular rhythm and normal heart sounds.   Pulmonary/Chest: Effort normal and breath sounds normal.  Musculoskeletal:  Left shoulder: She exhibits decreased range of motion, tenderness, pain and decreased strength. She exhibits no crepitus.  Vitals reviewed.         Assessment & Plan:  Left shoulder pain, unspecified chronicity - Plan: DG Shoulder Left  Obtain x-ray of the left shoulder given her history of cancer. However I believe her pain is more likely due to subacromial bursitis and tendinitis in her rotator cuff. Using sterile technique, I injected the left shoulder in the subacromial space with 2 mL of lidocaine, 2 mL of Marcaine, and 2 mL of 40 mg per mL Kenalog. Patient tolerated the procedure well without complication

## 2016-08-06 ENCOUNTER — Other Ambulatory Visit: Payer: Self-pay | Admitting: Family Medicine

## 2016-08-06 MED ORDER — MONTELUKAST SODIUM 10 MG PO TABS: 10.0000 mg | ORAL_TABLET | Freq: Every day | ORAL | 3 refills | Status: DC

## 2016-08-06 NOTE — Addendum Note (Signed)
Addended by: Shary Decamp B on: 08/06/2016 12:48 PM   Modules accepted: Orders

## 2016-08-09 ENCOUNTER — Telehealth: Payer: Self-pay | Admitting: Family Medicine

## 2016-08-09 MED ORDER — AMOXICILLIN 875 MG PO TABS: 875.0000 mg | ORAL_TABLET | Freq: Two times a day (BID) | ORAL | 0 refills | Status: DC

## 2016-08-09 NOTE — Addendum Note (Signed)
Addended by: Shary Decamp B on: 08/09/2016 04:52 PM   Modules accepted: Orders

## 2016-08-09 NOTE — Telephone Encounter (Signed)
Pharmacy called back and pt is allergic to pcn and sulfa - per wtp zpack - vo given to pharm.

## 2016-08-09 NOTE — Telephone Encounter (Signed)
Pt called and states that she had mentioned to you about a sinus infection and you told her to wait a while but it is getting worse and would like something called in if possible?

## 2016-08-09 NOTE — Telephone Encounter (Signed)
Medication called/sent to requested pharmacy and pt aware - states that she has been running a fever and pressure/pain in his face

## 2016-08-09 NOTE — Telephone Encounter (Signed)
Amoxicillin 875 bid for 10 days 

## 2016-09-17 NOTE — Progress Notes (Signed)
Krystal Brooks  Telephone:([phone]) [phone] Fax:(336) [phone]  Clinic follow up Note   Patient Care Team: Susy Frizzle, MD as PCP - General (Family Medicine) Truitt Merle, MD as Consulting Physician (Oncology) Excell Seltzer, MD as Consulting Physician (General Surgery) Thea Silversmith, MD as Consulting Physician (Radiation Oncology) Sylvan Cheese, NP as Nurse Practitioner (Hematology and Oncology) 09/20/2016  CHIEF COMPLAINTS:  Follow up right breast cancer   Oncology History   Breast cancer of lower-inner quadrant of right female breast [facility])   Staging form: Breast, AJCC 7th Edition     Clinical stage from 01/02/2016: Stage IA (T1a, N0, M0) - Signed by Truitt Merle, MD on 01/14/2016     Pathologic stage from 01/27/2016: Stage IA (T1b, N0, cM0) - Signed by Truitt Merle, MD on 03/07/2016          Breast cancer of lower-inner quadrant of right female breast ([place])   12/29/2015 Mammogram    Diagnostic right mammogram showed a 2.3 x 2.1 x 2.2 cm group of coarse calcification within the medial slightly lower right breast.      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       01/02/2016 Receptors her2    ER 10% positive, moderate staining. PR negative. Insufficient tissue for HER-2 testing.      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 0/2 SLN.       01/27/2016 Receptors her2    The surgical sample of the invasive carcinoma was not sufficient for Her2 test and other additional studies.      03/09/2016 - 04/19/2016 Radiation Therapy    Adjuvant breast radiation [name]). Right breast: 50 Gy in 25 fractions. Right breast "boost": 10 Gy in 5 fractions.        04/2016 -  Anti-estrogen oral therapy    Anastrozole 1 mg daily. Planned duration of therapy: 5 years       05/24/2016 Imaging    DEXA scan: Normal (T-score -0.6)      06/21/2016 Imaging    MM and [place] Right Breast 06/21/2016 IMPRESSION: No evidence of malignancy within the right breast. Specifically, no evidence of malignancy within the outer right breast corresponding to the area of clinical concern. There are expected postsurgical changes within the inner right breast. RECOMMENDATION: Annual bilateral diagnostic mammograms. Next bilateral mammogram will be due in April of 2018. Benign causes of breast pain, and possible remedies, were discussed with the patient. Patient was encouraged to follow-up with referring physician if pain became localized and persistent or if a palpable lump/mass developed.        HISTORY OF PRESENTING ILLNESS:  Krystal Brooks 61 y.o. female is here because of Her newly diagnosed right breast cancer. She is accompanied by her husband and of friend to our multidisciplinary rest clinic today.  This was found by screening mammogram,  she denies any palpable mass, skin change or nipple discharge, or ulcer constitutional symptoms before the screening.  Her prior mammo 6 years ago.  She had a right breast cyst removed in 1979.   She has hypertension, mild asthma, otherwise pretty healthy. She feels very well, no symptoms. She lives with her husband, her daughter, son-in-law and their 46-year-old branch regimen. She takes of her current child at home. She does not exercise regularly, but is very physically active. No family history of breast cancer or offering cancer.  GYN HISTORY  Menarchal: 11 LMP: 1990 she has hysrectomy for cerical dysplasia  Contraceptive: 16 years  HRT: one year in 2008 G2P2:  daughters 66 and 27, 3 grandchildern   CURRENT THERAPY: Anastrozole 1 mg daily since 04/2016  INTERIM HISTORY: Krystal Brooks returns for follow-up. She is doing well overall. She has been compliant and tolerating anastrozole very well, mild hot flash, no significant arthralgia or other side effects. She has good appetite and energy level, tolerating routine activities well.   MEDICAL HISTORY:  Past Medical History:  Diagnosis Date  . Allergy   . Anxiety   . Asthma   . Atherosclerosis of aorta (HCC)    bulge above renal arteries on mri 2016  . Cancer [facility]) 2017   right breast ca  . Complication of anesthesia   . Hypertension   . PONV (postoperative nausea and vomiting)   . Smoker     SURGICAL HISTORY: Past Surgical History:  Procedure Laterality Date  . ABDOMINAL HYSTERECTOMY    . BREAST LUMPECTOMY WITH RADIOACTIVE SEED AND SENTINEL LYMPH NODE BIOPSY Right 01/27/2016   Procedure: BREAST LUMPECTOMY WITH RADIOACTIVE SEED AND SENTINEL LYMPH NODE BIOPSY;  Surgeon: Excell Seltzer, MD;  Location: [place];  Service: General;  Laterality: Right;  . BREAST SURGERY  1979   cyst removed rt breast  . CESAREAN SECTION     x2    SOCIAL HISTORY: Social History   Social History  . Marital status: Married    Spouse name: N/A  . Number of children: N/A  . Years of education: N/A   Occupational History  . Not on file.   Social History Main Topics  . Smoking status: Former Smoker    Packs/day: 0.50    Years: 40.00    Types: Cigarettes  . Smokeless tobacco: Never Used  . Alcohol use 0.0 oz/week     Comment: social  . Drug use: No  . Sexual activity: Yes    Birth control/ protection: Surgical     Comment: hysterectomy   Other Topics Concern  . Not on file   Social History Narrative  . No narrative on file   She is a retired Customer service manager  FAMILY HISTORY: Family History  Problem Relation Age of Onset  . Hearing loss Mother   . Asthma Father   . Cancer Father      melanoma, metastases   . Diabetes Father   . Heart disease Father   . Early death Sister   . Early death Paternal Grandfather   . Cancer Maternal Grandmother 88    colon cancer     ALLERGIES:  is allergic to penicillins and sulfa antibiotics.  MEDICATIONS:  Current Outpatient Prescriptions  Medication Sig Dispense Refill  . anastrozole (ARIMIDEX) 1 MG tablet Take 1 tablet (1 mg total) by mouth daily. 30 tablet 5  . losartan-hydrochlorothiazide (HYZAAR) 50-12.5 MG tablet TAKE 2 TABLETS BY MOUTH DAILY. 60 tablet 5  . montelukast (SINGULAIR) 10 MG tablet Take 1 tablet (10 mg total) by mouth daily. 90 tablet 3  . Umeclidinium-Vilanterol (ANORO ELLIPTA) 62.5-25 MCG/INH AEPB Inhale 1 puff  into the lungs daily. 1 each 11  . VENTOLIN HFA 108 (90 Base) MCG/ACT inhaler USE 1-2 PUFFS EVERY 6 HOURS AS NEEDED FOR WHEEZING 18 Inhaler 3  . Potassium Chloride POWD 20 mEq by Does not apply route daily. 500 g 1  . potassium chloride SA (K-DUR,KLOR-CON) 20 MEQ tablet Take 1 tablet (20 mEq total) by mouth daily. (Patient not taking: Reported on 09/20/2016) 30 tablet 0   No current facility-administered medications for this visit.     REVIEW OF SYSTEMS:   Constitutional: Denies fevers, chills or abnormal night sweats Eyes: Denies blurriness of vision, double vision or watery eyes Ears, nose, mouth, throat, and face: Denies mucositis or sore throat Respiratory: Denies cough, dyspnea or wheezes Cardiovascular: Denies palpitation, chest discomfort or lower extremity swelling Gastrointestinal:  Denies nausea, heartburn or change in bowel habits Skin: Denies abnormal skin rashes Lymphatics: Denies new lymphadenopathy or easy bruising Neurological:Denies numbness, tingling or new weaknesses Behavioral/Psych: Mood is stable, no new changes  All other systems were reviewed with the patient and are negative.  PHYSICAL EXAMINATION: ECOG PERFORMANCE STATUS: 0 - Asymptomatic  Vitals:   09/20/16 1304  BP:  (!) 167/92  Pulse: 88  Resp: 18  Temp: 97.8 F (36.6 C)   Filed Weights   09/20/16 1304  Weight: 124 lb 4.8 oz (56.4 kg)    GENERAL:alert, no distress and comfortable SKIN: skin color, texture, turgor are normal, no rashes or significant lesions EYES: normal, conjunctiva are pink and non-injected, sclera clear OROPHARYNX:no exudate, no erythema and lips, buccal mucosa, and tongue normal  NECK: supple, thyroid normal size, non-tender, without nodularity LYMPH:  no palpable lymphadenopathy in the cervical, axillary or inguinal LUNGS: clear to auscultation and percussion with normal breathing effort HEART: regular rate & rhythm and no murmurs and no lower extremity edema ABDOMEN:abdomen soft, non-tender and normal bowel sounds Musculoskeletal:no cyanosis of digits and no clubbing  PSYCH: alert & oriented x 3 with fluent speech NEURO: no focal motor/sensory deficits Breasts: Breast inspection showed them to be symmetrical with no nipple discharge. (+) mild skin pigmentation in the right breast secondary to radiation. No open ulcer or blister. Palpation of the breasts and axilla revealed no obvious mass that I could appreciate.   LABORATORY DATA:  I have reviewed the data as listed Lab Results  Component Value Date   WBC 6.6 09/20/2016   HGB 12.5 09/20/2016   HCT 35.7 09/20/2016   MCV 87.7 09/20/2016   PLT 185 09/20/2016    Recent Labs  04/19/16 0827 06/14/16 0828 09/20/16 1234  NA 132* 134* 130*  K 3.4* 3.1* 3.2*  CO2 27 27 27   GLUCOSE 135 83 142*  BUN 9.9 10.6 9.0  CREATININE 0.8 0.7 0.7  CALCIUM 9.5 9.5 9.2  PROT 7.4 7.4 7.1  ALBUMIN 3.7 3.7 3.8  AST 17 18 18   ALT 18 19 19   ALKPHOS 64 64 61  BILITOT 0.49 0.43 0.38   PATHOLOGY REPORT  Diagnosis 01/02/2016 Breast, right, needle core biopsy, lower, inner quadrant - INVASIVE MAMMARY CARCINOMA - 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 - SEE COMMENT. Microscopic Comment A smooth muscle myosin  and calponin stain are performed on two blocks (4 stains total). A cytokeratin AE1/AE3 stain is also performed on a single block. The morphology coupled with the staining pattern is consistent with a very small focus of invasive mammary carcinoma in a background of mammary carcinoma in situ. An E-cadherin immunohistochemical stain will be performed to determine whether the  tumor is ductal or lobular in nature (i.e. invasive ductal carcinoma with high grade ductal carcinoma in situ or pleomorphic invasive lobular carcinoma with lobular carcinoma in situ). The results will be reported in an addendum to follow. Although definitive grading of breast carcinoma is best done on excision, the features of the invasive tumor from the right lower inner quadrant breast needle core biopsy are compatible with a grade 2 breast carcinoma. A breast prognostic markers will be attempted (although the focus of invasive tumor is very small). The results of the profile will also be reported in an addendum to follow. Findings are called to the [place] on 01/06/16. Dr. Lyndon Code has seen this case in consultation with agreement. (RAH:gt, 01/06/16)  ADDENDUM: An E-cadherin immunohistochemical stain is performed. 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 Dr. Lyndon Code has seen the E-cadherin immunohistochemical stain in consultation with agreement of this assessment.  Results: IMMUNOHISTOCHEMICAL AND MORPHOMETRIC ANALYSIS PERFORMED MANUALLY Estrogen Receptor: 10%, POSITIVE, MODERATE STAINING INTENSITY Progesterone Receptor: 0%, NEGATIVE Proliferation Marker Ki67: 10%  Results: HER2 - INSUFFICIENT: Not enough tumor cells for HER2 Analysis.  Diagnosis 01/27/2016 1. Breast, lumpectomy, Right - SCATTERED MICROSCOPIC FOCI OF INVASIVE LOBULAR CARCINOMA, PLEOMORPHIC VARIANT, GRADE II/III. - LOBULAR CARCINOMA  IN SITU, PLEOMORPHIC VARIANT. - THE SURGICAL RESECTION MARGINS ARE NEGATIVE FOR INVASIVE CARCINOMA. - SEE ONCOLOGY TABLE BELOW. 2. Breast, excision, Right Additional Medial Margin - FIBROCYSTIC CHANGES WITH ADENOSIS AND CALCIFICATIONS. - HEALING BIOPSY SITE. - THERE IS NO EVIDENCE OF MALIGNANCY. - SEE COMMENT. 3. Lymph node, sentinel, biopsy, Right Axillary #1 - THERE IS NO EVIDENCE OF CARCINOMA IN 1 OF 1 LYMPH NODE (0/1). - SEE COMMENT. 4. Lymph node, sentinel, biopsy, Right Axillary #2 - THERE IS NO EVIDENCE OF CARCINOMA IN 1 OF 1 LYMPH NODE (0/1). - SEE COMMENT. Microscopic Comment 1. BREAST, INVASIVE TUMOR, WITH LYMPH NODES PRESENT Specimen, including laterality and lymph node sampling (sentinel, non-sentinel): Right breast and right axillary lymph nodes. Procedure: Seed localized lumpectomy, additional medial margin resection, and two axillary lymph node resections. Histologic type: Lobular, pleomorphic variant. Grade: 2 Tubule formation: 3 Nuclear pleomorphism: 2 Mitotic:1 Tumor size (glass slide measurement): Estimated to be 0.6 cm, see comment. Margins: Invasive, distance to closest margin: Greater than 0.2 cm to all margins. Lymphovascular invasion: Not identified. 1 of 3 FINAL for OLIVETTE, BECKMANN L [id number]) Microscopic Comment(continued) Ductal carcinoma in situ: Not identified. Tumor focality: Unifocal, see comment. Treatment effect: N/A Extent of tumor: Confined to breast parenchyma. Lymph nodes: Examined: 2 Sentinel 0 Non-sentinel 2 Total Lymph nodes with metastasis: 0 Breast prognostic profile: Case [id number] Estrogen receptor: 10% moderate Progesterone receptor: 0% Her 2 neu: Insufficient invasive tumor Ki-67: 10% Non-neoplastic breast: Fibrocystic changes with adenosis and calcifications and healing biopsy site. TNM: pT1b, pN0 Comments: The vast majority of the nodular lesion consists of pleomorphic variant of lobular carcinoma in situ. However,  there are a few scattered microscopic (all less than 0.1 cm) foci of invasive lobular carcinoma. As these foci are in contiguous tissue sections, and each section is approximately 0.3 cm, I believe the largest overall dimension of invasive tumor is up to 0.6 cm. Given that, I do not believe there is sufficient invasive tumor present in a single block to do additional studies, including Her 2 neu. (JBK:ecj 01/29/2016) 2. The surgical resection margin(s) of the specimen were inked and microscopically evaluated. . 3. and 4. Immunohistochemical  stains performed on both parts #3 and #4 failed to highlight the presence of cytokeratin positive tumor cells. (JBK:ds 01/29/16) Enid Cutter MD   RADIOGRAPHIC STUDIES: I have personally reviewed the radiological images as listed and agreed with the findings in the report. No results found.  ASSESSMENT & PLAN:  61 y.o. postmenopausal woman, presented with screening discovered LCIS and small focus of invasive lobular carcinoma. .  1. Right lower inner quadrant breast cancer, small focus of invasive lobular carcinoma, in the background of LCIS. PT1bN0M0, stage IA, ER10%+, PR-, HER2 unknown  -I discussed her surgical path results with patient in great detail. -Majority of their breast cancer are LCIS, but there are scatted small component of invasive lobular carcinoma. Giving the negative margins, she is likely cured. -We discussed the risk of cancer recurrence, giving the very early stage disease, the risk of distant recurrence is likely low. She unfortunately did not have sufficient tumor tissues for HER-2 or Oncotype testing. -She will start adjuvant breast radiation soon. -Given the lobular histology, I recommend her to consider adjuvant hormonal therapy with aromatase inhibitor. Her tumor has low ER positivity (10%), PR negative, so the benefit is probably moderate to low. -she has been tolerating adjuvant anastrozole very well, we'll continue for 5  years -We also discussed breast cancer surveillance after she completes treatment, Including annual mammogram, breast exam every 6-12 months. She is due for mammogram in May 2018. -I encouraged her to continue healthy diet and exercise regularly.  2. Bone health -her recent bone density scan was normal -I encouraged her to continue calcium and vitamin D  3. HTN  -she will continue follow-up with her primary care physician   Plan -She Will continue Anastrozole -Return to clinic with lab in 4 months, if she is doing well, we'll see her every 6 monthsafter next visit  -bilateral diagnostic mammogram in May 2018   All questions were answered. The patient knows to call the clinic with any problems, questions or concerns. I spent 15 minutes counseling the patient face to face. The total time spent in the appointment was 20 minutes and more than 50% was on counseling.     Truitt Merle, MD 09/20/2016

## 2016-09-20 ENCOUNTER — Ambulatory Visit (HOSPITAL_BASED_OUTPATIENT_CLINIC_OR_DEPARTMENT_OTHER): Payer: Commercial Managed Care - HMO | Admitting: Hematology

## 2016-09-20 ENCOUNTER — Encounter: Payer: Self-pay | Admitting: Hematology

## 2016-09-20 ENCOUNTER — Other Ambulatory Visit (HOSPITAL_BASED_OUTPATIENT_CLINIC_OR_DEPARTMENT_OTHER): Payer: Commercial Managed Care - HMO

## 2016-09-20 ENCOUNTER — Telehealth: Payer: Self-pay | Admitting: Hematology

## 2016-09-20 VITALS — BP 167/92 | HR 88 | Temp 97.8°F | Resp 18 | Ht 61.0 in | Wt 124.3 lb

## 2016-09-20 DIAGNOSIS — Z170 Estrogen receptor positive status [ER+]: Secondary | ICD-10-CM | POA: Diagnosis not present

## 2016-09-20 DIAGNOSIS — I10 Essential (primary) hypertension: Secondary | ICD-10-CM | POA: Diagnosis not present

## 2016-09-20 DIAGNOSIS — C50311 Malignant neoplasm of lower-inner quadrant of right female breast: Secondary | ICD-10-CM | POA: Diagnosis not present

## 2016-09-20 LAB — COMPREHENSIVE METABOLIC PANEL
ALBUMIN: 3.8 g/dL (ref 3.5–5.0)
ALK PHOS: 61 U/L (ref 40–150)
ALT: 19 U/L (ref 0–55)
ANION GAP: 10 meq/L (ref 3–11)
AST: 18 U/L (ref 5–34)
BUN: 9 mg/dL (ref 7.0–26.0)
CALCIUM: 9.2 mg/dL (ref 8.4–10.4)
CHLORIDE: 93 meq/L — AB (ref 98–109)
CO2: 27 mEq/L (ref 22–29)
Creatinine: 0.7 mg/dL (ref 0.6–1.1)
EGFR: >90 mL/min/{1.73_m2} (ref >90)
Glucose: 142 mg/dl — ABNORMAL HIGH (ref 70–140)
POTASSIUM: 3.2 meq/L — AB (ref 3.5–5.1)
Sodium: 130 mEq/L — ABNORMAL LOW (ref 136–145)
Total Bilirubin: 0.38 mg/dL (ref 0.20–1.20)
Total Protein: 7.1 g/dL (ref 6.4–8.3)

## 2016-09-20 LAB — CBC WITH DIFFERENTIAL/PLATELET
BASO%: 0.2 % (ref 0.0–2.0)
BASOS ABS: 0 10*3/uL (ref 0.0–0.1)
EOS ABS: 0 10*3/uL (ref 0.0–0.5)
EOS%: 0.2 % (ref 0.0–7.0)
HEMATOCRIT: 35.7 % (ref 34.8–46.6)
HEMOGLOBIN: 12.5 g/dL (ref 11.6–15.9)
LYMPH#: 1 10*3/uL (ref 0.9–3.3)
LYMPH%: 15.7 % (ref 14.0–49.7)
MCH: 30.7 pg (ref 25.1–34.0)
MCHC: 35 g/dL (ref 31.5–36.0)
MCV: 87.7 fL (ref 79.5–101.0)
MONO#: 0.5 10*3/uL (ref 0.1–0.9)
MONO%: 7 % (ref 0.0–14.0)
NEUT#: 5.1 10*3/uL (ref 1.5–6.5)
NEUT%: 76.9 % — AB (ref 38.4–76.8)
PLATELETS: 185 10*3/uL (ref 145–400)
RBC: 4.07 10*6/uL (ref 3.70–5.45)
RDW: 14.1 % (ref 11.2–14.5)
WBC: 6.6 10*3/uL (ref 3.9–10.3)

## 2016-09-20 MED ORDER — POTASSIUM CHLORIDE POWD: 20.0000 meq | Freq: Every day | 1 refills | Status: DC

## 2016-09-20 NOTE — Telephone Encounter (Signed)
Appointments scheduled per 09/20/16 los. Patient was given a copy of the appointment schedule and AVS report, per 09/20/16 los. ° °

## 2016-10-05 ENCOUNTER — Other Ambulatory Visit: Payer: Self-pay | Admitting: Family Medicine

## 2016-10-25 DIAGNOSIS — H2513 Age-related nuclear cataract, bilateral: Secondary | ICD-10-CM | POA: Diagnosis not present

## 2016-11-24 ENCOUNTER — Other Ambulatory Visit: Payer: Self-pay | Admitting: Family Medicine

## 2016-11-25 DIAGNOSIS — H2511 Age-related nuclear cataract, right eye: Secondary | ICD-10-CM | POA: Diagnosis not present

## 2016-11-25 DIAGNOSIS — H25813 Combined forms of age-related cataract, bilateral: Secondary | ICD-10-CM | POA: Diagnosis not present

## 2016-12-24 ENCOUNTER — Telehealth: Payer: Self-pay | Admitting: Hematology

## 2016-12-24 NOTE — Telephone Encounter (Signed)
Called patient to verify appointment change and she was not able to make the appointment. So I moved her to 5/11 at 8:15 with 7:45 lab

## 2016-12-27 ENCOUNTER — Other Ambulatory Visit: Payer: Self-pay | Admitting: Hematology

## 2016-12-27 ENCOUNTER — Ambulatory Visit
Admission: RE | Admit: 2016-12-27 | Discharge: 2016-12-27 | Disposition: A | Discharge status: Home / Self Care | Payer: Commercial Managed Care - HMO | Source: Ambulatory Visit | Attending: Hematology | Admitting: Hematology

## 2016-12-27 DIAGNOSIS — H2511 Age-related nuclear cataract, right eye: Secondary | ICD-10-CM | POA: Diagnosis not present

## 2016-12-27 DIAGNOSIS — Z170 Estrogen receptor positive status [ER+]: Principal | ICD-10-CM

## 2016-12-27 DIAGNOSIS — C50311 Malignant neoplasm of lower-inner quadrant of right female breast: Secondary | ICD-10-CM

## 2016-12-27 DIAGNOSIS — N6489 Other specified disorders of breast: Secondary | ICD-10-CM | POA: Diagnosis not present

## 2016-12-27 DIAGNOSIS — H268 Other specified cataract: Secondary | ICD-10-CM | POA: Diagnosis not present

## 2016-12-27 DIAGNOSIS — N6320 Unspecified lump in the left breast, unspecified quadrant: Secondary | ICD-10-CM

## 2016-12-27 DIAGNOSIS — R928 Other abnormal and inconclusive findings on diagnostic imaging of breast: Secondary | ICD-10-CM | POA: Diagnosis not present

## 2016-12-27 HISTORY — DX: Personal history of irradiation: Z92.3

## 2016-12-28 ENCOUNTER — Other Ambulatory Visit: Payer: Commercial Managed Care - HMO

## 2016-12-28 ENCOUNTER — Ambulatory Visit: Payer: Commercial Managed Care - HMO | Admitting: Hematology

## 2016-12-29 ENCOUNTER — Ambulatory Visit
Admission: RE | Admit: 2016-12-29 | Discharge: 2016-12-29 | Disposition: A | Discharge status: Home / Self Care | Payer: Commercial Managed Care - HMO | Source: Ambulatory Visit | Attending: Hematology | Admitting: Hematology

## 2016-12-29 DIAGNOSIS — Z170 Estrogen receptor positive status [ER+]: Principal | ICD-10-CM

## 2016-12-29 DIAGNOSIS — C50311 Malignant neoplasm of lower-inner quadrant of right female breast: Secondary | ICD-10-CM

## 2016-12-29 DIAGNOSIS — N6320 Unspecified lump in the left breast, unspecified quadrant: Secondary | ICD-10-CM

## 2016-12-29 DIAGNOSIS — C50212 Malignant neoplasm of upper-inner quadrant of left female breast: Secondary | ICD-10-CM | POA: Diagnosis not present

## 2016-12-29 DIAGNOSIS — N6322 Unspecified lump in the left breast, upper inner quadrant: Secondary | ICD-10-CM | POA: Diagnosis not present

## 2016-12-30 ENCOUNTER — Telehealth: Payer: Self-pay | Admitting: *Deleted

## 2016-12-30 NOTE — Telephone Encounter (Signed)
Called pt and gave pt appts date and time for Monday 01/03/17.  Pt voiced understanding.

## 2016-12-30 NOTE — Telephone Encounter (Signed)
FYI "I received an automated phone call about my appointments and I'm confused and need clarification.  I thought I am to have lab and see Dr.Feng the same day.  I also would like to be seen sooner than May 11th.  My mammogram and [place] on April 30th did show a mass in my left breast.  Return number [phone].  I Can come in tomorrow if needed."   Will notify provider and notified who rescheduled of patient's request.

## 2016-12-31 ENCOUNTER — Other Ambulatory Visit: Payer: Commercial Managed Care - HMO

## 2016-12-31 ENCOUNTER — Telehealth: Payer: Self-pay | Admitting: *Deleted

## 2016-12-31 ENCOUNTER — Ambulatory Visit: Payer: Commercial Managed Care - HMO | Admitting: Hematology

## 2016-12-31 NOTE — Progress Notes (Signed)
[place]  Telephone:([phone]) [phone] Fax:(336) [phone]  Clinic follow up Note   Patient Care Team: Susy Frizzle, MD as PCP - General (Family Medicine) Truitt Merle, MD as Consulting Physician (Oncology) Excell Seltzer, MD as Consulting Physician (General Surgery) Thea Silversmith, MD (Inactive) as Consulting Physician (Radiation Oncology) Sylvan Cheese, NP as Nurse Practitioner (Hematology and Oncology) Druscilla Brownie, MD as Consulting Physician (Dermatology) 01/03/2017  CHIEF COMPLAINTS: Bilateral breast cancers  CURRENT TREATMENT: Anastrozole   Oncology History   Breast cancer of lower-inner quadrant of right female breast [facility])   Staging form: Breast, AJCC 7th Edition     Clinical stage from 01/02/2016: Stage IA (T1a, N0, M0) - Signed by Truitt Merle, MD on 01/14/2016     Pathologic stage from 01/27/2016: Stage IA (T1b, N0, cM0) - Signed by Truitt Merle, MD on 03/07/2016          Malignant neoplasm of lower-inner quadrant of right breast of female, estrogen receptor positive ([place])   12/29/2015 Mammogram    Diagnostic right mammogram showed a 2.3 x 2.1 x 2.2 cm group of coarse calcification within the medial slightly lower right breast.      01/02/2016 Initial Diagnosis    Breast cancer of lower-inner quadrant of right female breast ([place])      01/02/2016 Initial Biopsy    right breast LIQ core needle biopsy showed LCIS, with a small focus of invasive lobular carcinoma, grade 2.       01/02/2016 Receptors her2    ER 10% positive, moderate staining. PR negative. Insufficient tissue for HER-2 testing.      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 0/2 SLN.       01/27/2016 Receptors her2    The surgical sample of the invasive carcinoma was not sufficient for Her2 test and other additional studies.      03/09/2016 - 04/19/2016 Radiation Therapy    Adjuvant breast radiation [name]). Right breast: 50 Gy in 25 fractions. Right breast "boost": 10 Gy in 5 fractions.        04/2016 -  Anti-estrogen oral therapy    Anastrozole 1 mg daily. Planned duration of therapy: 5 years       05/24/2016 Imaging    DEXA scan: Normal (T-score -0.6)      06/21/2016 Imaging    MM and [place] Right Breast 06/21/2016 IMPRESSION: No evidence of malignancy within the right breast. Specifically, no evidence of malignancy within the outer right breast corresponding to the area of clinical concern. There are expected postsurgical changes within the inner right breast. RECOMMENDATION: Annual bilateral diagnostic mammograms. Next bilateral mammogram will be due in April of 2018. Benign causes of breast pain, and possible remedies, were discussed with the patient. Patient was encouraged to follow-up with referring physician if pain became localized and persistent or if a palpable lump/mass developed.      INTERIM HISTORY:  NEW LEFT BREAST CANCER Blair was evaluated in the breast clinic 01/03/2017 accompanied by her husband and a close friend. She is establishing herself in my practice today.  The patient was seen by Dr. Burr Medico previously for a stage I estrogen receptor positive breast cancer, which was treated with lumpectomy and radiation and  then started on anastrozole. Teyonna tells me that she has been taking her anastrozole faithfully. She has generally tolerated it well.  Nevertheless on 12/27/2016 when she had her annual mammography with tomography at the [place] mass was noted in the contralateral, left breast upper inner quadrant. There were no suspicious calcifications. On exam a firm palpable mass was identified at the 11:00 position of the left breast 5 cm from the  nipple.  Targeted ultrasonography confirmed a 1.4 cm irregular hypoechoic mass at the area in question. The left axilla was sonographically benign.  Biopsy of the left breast mass in question 12/29/2016 showed ([id number]) invasive lobular carcinoma, E-cadherin negative, grade 2, HER-2 nonamplified, with a signals ratio of 1.35 and the number per cell 2.70. The rest of the upper gastric panel is pending.  The patient is here today to further discuss this diagnosis and to develop a plan.  REVIEW OF SYSTEMS: There were no specific symptoms leading to the diagnostic mammogram, which was routinely scheduled. The patient denies unusual headaches, visual changes, nausea, vomiting, stiff neck, dizziness, or gait imbalance. There has been no cough, phlegm production, or pleurisy, no chest pain or pressure, and no change in bowel or bladder habits. The patient denies fever, rash, bleeding, unexplained fatigue or unexplained weight loss. The patient does not exercise regularly but is very active vertically with her grandchildren. A detailed review of systems was otherwise entirely negative.   HISTORY OF RIGHT BREAST CANCER From Dr Ernestina Penna 01/14/16 note:  [name] 61 y.o. female is here because of Her newly diagnosed right breast cancer. She is accompanied by her husband and of friend to our multidisciplinary rest clinic today.  This was found by screening mammogram,  she denies any palpable mass, skin change or nipple discharge, or ulcer constitutional symptoms before the screening.  Her prior mammo 6 years ago. She had a right breast cyst removed in 1979.   She has hypertension, mild asthma, otherwise pretty healthy. She feels very well, no symptoms. She lives with her husband, her daughter, son-in-law and their 55-year-old branch regimen. She takes of her current child at home. She does not exercise regularly, but is very physically active. No family history of breast cancer or offering cancer.  Past  Medical History:  Diagnosis Date   Allergy    Anxiety    Asthma    Atherosclerosis of aorta (HCC)    bulge above renal arteries on mri 2016   Cancer [facility]) 2017   right breast ca   Complication of anesthesia    Hypertension    Personal history of radiation therapy    PONV (postoperative nausea and vomiting)    Smoker     SURGICAL HISTORY: Past Surgical History:  Procedure Laterality Date   ABDOMINAL HYSTERECTOMY     BREAST BIOPSY Right 01/02/2016   BREAST EXCISIONAL BIOPSY Right 1979   BREAST LUMPECTOMY Right 01/27/2016   BREAST LUMPECTOMY WITH RADIOACTIVE SEED AND SENTINEL LYMPH NODE BIOPSY Right 01/27/2016   Procedure: BREAST LUMPECTOMY WITH RADIOACTIVE SEED AND SENTINEL LYMPH NODE BIOPSY;  Surgeon: Excell Seltzer, MD;  Location: [place];  Service: General;  Laterality: Right;   BREAST SURGERY  1979   cyst removed rt breast   CESAREAN SECTION     x2   Family History  Problem Relation Age of Onset   Hearing loss Mother    Asthma Father    Cancer Father     melanoma, metastases  Diabetes Father    Heart disease Father    Early death Sister    Early death Paternal Grandfather    Cancer Maternal Grandmother 54    colon cancer   The patient's father died at age 63. She had been diagnosed with melanoma in 1 year. That was not the cause of death. The patient's mother is living at age 28 as of May 2018. The patient has one brother, no sisters. There is no history of breast or ovarian cancer in the family, and no other melanoma history.  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 She frequently keeps her 2 grandchildren. Her husband Clair Gulling is a retired Airline pilot. He now owns a business where they prepare the front car of a trailer hoarse  hitch combination. Daughter April lives in [place] where she works as a Statistician. Daughter Ailene Ravel lives in [place] where she is an Forensic scientist for [employer]. The patient has 4 grandchildren. She is a Tourist information centre manager.   Social History   Social History   Marital status: Married    Spouse name: N/A   Number of children: N/A   Years of education: N/A   Occupational History   Not on file.   Social History Main Topics   Smoking status: Former Smoker    Packs/day: 0.50    Years: 40.00    Types: Cigarettes   Smokeless tobacco: Never Used   Alcohol use 0.0 oz/week     Comment: social   Drug use: No   Sexual activity: Yes    Birth control/ protection: Surgical     Comment: hysterectomy   Other Topics Concern   Not on file   Social History Narrative   No narrative on file  ALLERGIES:  is allergic to penicillins and sulfa antibiotics.  MEDICATIONS:  Current Outpatient Prescriptions  Medication Sig Dispense Refill   anastrozole (ARIMIDEX) 1 MG tablet Take 1 tablet (1 mg total) by mouth daily. 30 tablet 5   ANORO ELLIPTA 62.5-25 MCG/INH AEPB INHALE 1 PUFF INTO THE LUNGS DAILY 60 each 10   losartan-hydrochlorothiazide (HYZAAR) 50-12.5 MG tablet TAKE 2 TABLETS BY MOUTH DAILY. 60 tablet 5   montelukast (SINGULAIR) 10 MG tablet TAKE 1 TABLET BY MOUTH EVERY DAY 90 tablet 0   potassium chloride SA (K-DUR,KLOR-CON) 20 MEQ tablet Take 1 tablet (20 mEq total) by mouth daily. (Patient not taking: Reported on 09/20/2016) 30 tablet 0   VENTOLIN HFA 108 (90 Base) MCG/ACT inhaler USE 1-2 PUFFS EVERY 6 HOURS AS NEEDED FOR WHEEZING 18 Inhaler 3   No current facility-administered medications for this visit.     PHYSICAL EXAMINATION: ECOG PERFORMANCE STATUS: 0 - Asymptomatic  Vitals:   01/03/17 0933  BP: (!) 164/96  Pulse: 78  Resp: 18  Temp: 98 F (36.7 C)   Filed Weights   01/03/17 0933  Weight: 128 lb 9.6 oz (58.3 kg)   Sclerae  unicteric, pupils round and equal Oropharynx clear and moist No cervical or supraclavicular adenopathy Lungs no rales or rhonchi Heart regular rate and rhythm Abd soft, nontender, positive bowel sounds MSK no focal spinal tenderness, no upper extremity lymphedema Neuro: nonfocal, well oriented, appropriate affect Breasts: The right breast is status post prior lumpectomy and radiation, with no evidence of local  recurrence. The left breast is status post recent biopsy. There is a moderate ecchymosis. I do not palpate a mass aside from the ecchymosis. There are no other skin or nipple changes of concern. Both axillae are benign.    LABORATORY DATA:  I have reviewed the data as listed Lab Results  Component Value Date   WBC 4.5 01/03/2017   HGB 12.3 01/03/2017   HCT 35.4 01/03/2017   MCV 88.0 01/03/2017   PLT 200 01/03/2017    Recent Labs  06/14/16 0828 09/20/16 1234 01/03/17 0922  NA 134* 130* 133*  K 3.1* 3.2* 3.4*  CO2 27 27 29   GLUCOSE 83 142* 114  BUN 10.6 9.0 10.8  CREATININE 0.7 0.7 0.7  CALCIUM 9.5 9.2 9.5  PROT 7.4 7.1 7.1  ALBUMIN 3.7 3.8 3.8  AST 18 18 18   ALT 19 19 19   ALKPHOS 64 61 60  BILITOT 0.43 0.38 0.47   PATHOLOGY REPORT The available results of the left breast biopsy were discussed with the patient and provided to her. We also reviewed her earlier right sided biopsy and again those results were given to her in writing.  RADIOGRAPHIC STUDIES:  US Breast Ltd Uni Left Inc Axilla  Result Date: 12/27/2016 CLINICAL DATA:  61 year old female for annual bilateral mammograms. History of right breast cancer and lumpectomy and 2017. EXAM: 2D DIGITAL DIAGNOSTIC BILATERAL MAMMOGRAM WITH CAD AND ADJUNCT TOMO ULTRASOUND LEFT BREAST COMPARISON:  Previous exam(s). ACR Breast Density Category c: The breast tissue is heterogeneously dense, which may obscure small masses. FINDINGS: 2D and 3D full field views of both breasts, a magnification view of the right lumpectomy  site and spot compression views of the left breast are performed. A new irregular mass within the upper inner left breast noted. Surgical changes within the inner right breast are present. No suspicious calcifications are noted bilaterally. Mammographic images were processed with CAD. On physical exam, a firm palpable mass identified at the 11 o'clock position of the left breast 5 cm from the nipple. Targeted ultrasound is performed, showing a 1.1 x 1 x 1.4 cm irregular hypoechoic mass at the 11 o'clock position of the left breast 5 cm from the nipple. No abnormal left axillary lymph nodes are identified. IMPRESSION: Suspicious 1.4 cm mass in the upper inner left breast. Tissue sampling is recommended. No abnormal left axillary lymph nodes. No evidence of right breast malignancy. RECOMMENDATION: Ultrasound-guided left breast biopsy, which will be scheduled. I have discussed the findings and recommendations with the patient. Results were also provided in writing at the conclusion of the visit. If applicable, a reminder letter will be sent to the patient regarding the next appointment. BI-RADS CATEGORY  4: Suspicious. Electronically Signed   By: Margarette Canada M.D.   On: 12/27/2016 10:46   Mm Diag Breast Tomo Bilateral  Result Date: 12/27/2016 CLINICAL DATA:  61 year old female for annual bilateral mammograms. History of right breast cancer and lumpectomy and 2017. EXAM: 2D DIGITAL DIAGNOSTIC BILATERAL MAMMOGRAM WITH CAD AND ADJUNCT TOMO ULTRASOUND LEFT BREAST COMPARISON:  Previous exam(s). ACR Breast Density Category c: The breast tissue is heterogeneously dense, which may obscure small masses. FINDINGS: 2D and 3D full field views of both breasts, a magnification view of the right lumpectomy site and spot compression views of the left breast are performed. A new irregular mass within the upper inner left breast noted. Surgical changes within the inner right breast are present. No suspicious calcifications are noted  bilaterally. Mammographic images were processed with  CAD. On physical exam, a firm palpable mass identified at the 11 o'clock position of the left breast 5 cm from the nipple. Targeted ultrasound is performed, showing a 1.1 x 1 x 1.4 cm irregular hypoechoic mass at the 11 o'clock position of the left breast 5 cm from the nipple. No abnormal left axillary lymph nodes are identified. IMPRESSION: Suspicious 1.4 cm mass in the upper inner left breast. Tissue sampling is recommended. No abnormal left axillary lymph nodes. No evidence of right breast malignancy. RECOMMENDATION: Ultrasound-guided left breast biopsy, which will be scheduled. I have discussed the findings and recommendations with the patient. Results were also provided in writing at the conclusion of the visit. If applicable, a reminder letter will be sent to the patient regarding the next appointment. BI-RADS CATEGORY  4: Suspicious. Electronically Signed   By: Margarette Canada M.D.   On: 12/27/2016 10:46   Mm Clip Placement Left  Result Date: 12/29/2016 CLINICAL DATA:  Status post ultrasound-guided core needle biopsy of a 1.4 cm mass in the 11 o'clock position of the left breast. EXAM: DIAGNOSTIC LEFT MAMMOGRAM POST ULTRASOUND BIOPSY COMPARISON:  Previous exam(s). FINDINGS: Mammographic images were obtained following ultrasound guided biopsy of a 1.4 cm mass in the 11 o'clock position of the left breast, 5 cm from the nipple. These demonstrate a ribbon shaped biopsy marker clip at the expected location of the mass. IMPRESSION: Appropriate clip deployment following left breast ultrasound-guided core needle biopsy. Final Assessment: Post Procedure Mammograms for Marker Placement Electronically Signed   By: Claudie Revering M.D.   On: 12/29/2016 16:34   [place] Lt Breast Bx W Loc Dev 1st Lesion Img Bx Spec US Guide  Addendum Date: 12/30/2016   ADDENDUM REPORT: 12/30/2016 11:20 ADDENDUM: Pathology revealed grade II invasive mammary carcinoma in the left breast. This  was found to be concordant by Dr. Claudie Revering. Pathology results were discussed with the patient by telephone. The patient reported doing well after the biopsy with bruising at the site. Post biopsy instructions and care were reviewed and questions were answered. The patient was encouraged to call [facility] for any additional concerns. The patient has an appointment with Dr. Truitt Merle on Jan 07, 2017, and would like to discuss the results with her prior to scheduling a surgical consultation. Pathology results reported by Susa Raring RN, BSN on 12/30/2016. Electronically Signed   By: Claudie Revering M.D.   On: 12/30/2016 11:20   Result Date: 12/30/2016 CLINICAL DATA:  1.4 cm mass with recent imaging features suspicious for malignancy in the 11 o'clock position of the left breast. EXAM: ULTRASOUND GUIDED LEFT BREAST CORE NEEDLE BIOPSY COMPARISON:  Previous exam(s). FINDINGS: I met with the patient and we discussed the procedure of ultrasound-guided biopsy, including benefits and alternatives. We discussed the high likelihood of a successful procedure. We discussed the risks of the procedure, including infection, bleeding, tissue injury, clip migration, and inadequate sampling. Informed written consent was given. The usual time-out protocol was performed immediately prior to the procedure. Lesion quadrant: Left upper inner quadrant Using sterile technique and 1% Lidocaine as local anesthetic, under direct ultrasound visualization, a 14 gauge spring-loaded device was used to perform biopsy of the recently demonstrated 1.4 cm mass in the 11 o'clock position of the left breast, 5 cm from the nipple, using a caudal approach. At the conclusion of the procedure a ribbon shaped tissue marker clip was deployed into the biopsy cavity. Follow up 2 view mammogram was performed  and dictated separately. IMPRESSION: Ultrasound guided biopsy of a 1.4 cm mass in the 11 o'clock position of the left breast. No  apparent complications. Electronically Signed: By: Claudie Revering M.D. On: 12/29/2016 16:15    ASSESSMENT: 61 y.o. [place] woman, with bilateral breast cancer  (1) status post right lumpectomy and sentinel lymph node sampling 01/27/2016 for lobular carcinoma in situ, pleomorphic variant, with multiple foci of invasive lobular carcinoma, all less than a millimeter, but with negative margins and both sentinel lymph nodes negative, invasive disease being estrogen receptor positive, HER-2 not tested (pT1 [mic] pN0}  (2) adjuvant radiation 03/09/2016 through 04/19/2016 The patient initially received a dose of 50 Gy in 25 fractions to the breast using whole-breast tangent fields. This was delivered using a 3-D conformal technique. The patient then received a boost to the seroma. This delivered an additional 10 Gy in 5 fractions using an en face electron field due to the depth of the seroma. The total dose was 60 Gy.  (3) anastrozole started June 2017  (4) status post left breast lower inner quadrant biopsy 12/29/2016 for a clinical T1c N0  invasive lobular breast cancer, grade 2, HER-2 nonamplified, with a signals ratio 1.35 and the number per cell 2.70.  PLAN: We spent approximately one hour today going over her Marchele's complex situation. She understands her original cancer was microinvasive, and was appropriately treated with surgery, radiation, and anastrozole. She now has a different cancer, in the contralateral breast--not a recurrence of her original cancer.  What is disturbing is that this cancer did arise in the setting of adequate estrogen blockade with anastrozole. The patient tells me she seldom if ever missed and anastrozole dose.  Of course if the new, left-sided breast cancer is estrogen and progesterone receptor negative, then this could be explained. If it is estrogen receptor positive however we have estrogen resistance in a lobular breast cancer and this is of concern.  We  discussed the difference between ductal and lobular breast cancers in detail. The patient understands that lobular breast cancers do tend more frequently to arise bilaterally, and in those cases we think more there is a "field defect". With this means is that she is at high risk of developing yet another breast cancer after we have taking care of the new, left-sided 1.  For this reason the patient is strongly considering bilateral mastectomies. She understands there is no survival to bilateral mastectomies as compared to lumpectomy alone followed by radiation on the left, just as she did on the right. The reason she is considering bilateral mastectomies is not survival, but the concern anxiety and general life disturbance of having yet another breast cancer recurrence. In her situation I think this concern is entirely appropriate  She understands that the decision regarding systemic therapy has nothing to do with her local treatment decision. Even if she has both breasts removed she still may need chemotherapy, if this cancer is estrogen receptor negative, or if it is HER-2 positive. If the cancer is estrogen receptor positive we would consider sending an Oncotype despite the fact that it arose in the setting of anastrozole.  Because we do not yet have the estrogen receptor status I am not changing her anastrozole to tamoxifen, but that would be the next move if the cancer proved to be estrogen receptor positive.  I'm referring her back to her surgeon to discuss a lateral mastectomies as well as reconstruction options. I will see her again after the definitive surgery.  She knows to call for any problems that may develop before the next visit here.   Chauncey Cruel, MD 01/03/2017

## 2016-12-31 NOTE — Telephone Encounter (Signed)
Spoke with patient and she has a friend that is a past Dr. Jana Hakim patient and would like to switch to Dr. Jana Hakim.  She was recently diagnosed with left breast cancer. Msg sent to Dr. Burr Medico and confirmed new appointment with Dr. Jana Hakim for 5/7 at [facility] for labs and 930am with Dr. Jana Hakim.

## 2017-01-03 ENCOUNTER — Ambulatory Visit (HOSPITAL_BASED_OUTPATIENT_CLINIC_OR_DEPARTMENT_OTHER): Payer: Commercial Managed Care - HMO | Admitting: Oncology

## 2017-01-03 ENCOUNTER — Encounter: Payer: Self-pay | Admitting: *Deleted

## 2017-01-03 ENCOUNTER — Other Ambulatory Visit (HOSPITAL_BASED_OUTPATIENT_CLINIC_OR_DEPARTMENT_OTHER): Payer: Commercial Managed Care - HMO

## 2017-01-03 VITALS — BP 164/96 | HR 78 | Temp 98.0°F | Resp 18 | Ht 61.0 in | Wt 128.6 lb

## 2017-01-03 DIAGNOSIS — C50911 Malignant neoplasm of unspecified site of right female breast: Secondary | ICD-10-CM | POA: Diagnosis not present

## 2017-01-03 DIAGNOSIS — C50311 Malignant neoplasm of lower-inner quadrant of right female breast: Secondary | ICD-10-CM

## 2017-01-03 DIAGNOSIS — C50212 Malignant neoplasm of upper-inner quadrant of left female breast: Secondary | ICD-10-CM

## 2017-01-03 DIAGNOSIS — Z170 Estrogen receptor positive status [ER+]: Secondary | ICD-10-CM

## 2017-01-03 LAB — COMPREHENSIVE METABOLIC PANEL
ALBUMIN: 3.8 g/dL (ref 3.5–5.0)
ALK PHOS: 60 U/L (ref 40–150)
ALT: 19 U/L (ref 0–55)
AST: 18 U/L (ref 5–34)
Anion Gap: 10 mEq/L (ref 3–11)
BILIRUBIN TOTAL: 0.47 mg/dL (ref 0.20–1.20)
BUN: 10.8 mg/dL (ref 7.0–26.0)
CO2: 29 meq/L (ref 22–29)
CREATININE: 0.7 mg/dL (ref 0.6–1.1)
Calcium: 9.5 mg/dL (ref 8.4–10.4)
Chloride: 94 mEq/L — ABNORMAL LOW (ref 98–109)
EGFR: >90 mL/min/{1.73_m2} (ref >90)
GLUCOSE: 114 mg/dL (ref 70–140)
Potassium: 3.4 mEq/L — ABNORMAL LOW (ref 3.5–5.1)
SODIUM: 133 meq/L — AB (ref 136–145)
TOTAL PROTEIN: 7.1 g/dL (ref 6.4–8.3)

## 2017-01-03 LAB — CBC WITH DIFFERENTIAL/PLATELET
BASO%: 0.6 % (ref 0.0–2.0)
Basophils Absolute: 0 10*3/uL (ref 0.0–0.1)
EOS%: 1.3 % (ref 0.0–7.0)
Eosinophils Absolute: 0.1 10*3/uL (ref 0.0–0.5)
HCT: 35.4 % (ref 34.8–46.6)
HEMOGLOBIN: 12.3 g/dL (ref 11.6–15.9)
LYMPH%: 19.9 % (ref 14.0–49.7)
MCH: 30.6 pg (ref 25.1–34.0)
MCHC: 34.8 g/dL (ref 31.5–36.0)
MCV: 88 fL (ref 79.5–101.0)
MONO#: 0.4 10*3/uL (ref 0.1–0.9)
MONO%: 8.7 % (ref 0.0–14.0)
NEUT%: 69.5 % (ref 38.4–76.8)
NEUTROS ABS: 3.1 10*3/uL (ref 1.5–6.5)
Platelets: 200 10*3/uL (ref 145–400)
RBC: 4.03 10*6/uL (ref 3.70–5.45)
RDW: 13.2 % (ref 11.2–14.5)
WBC: 4.5 10*3/uL (ref 3.9–10.3)
lymph#: 0.9 10*3/uL (ref 0.9–3.3)

## 2017-01-03 MED ORDER — ANASTROZOLE 1 MG PO TABS: 1.0000 mg | ORAL_TABLET | Freq: Every day | ORAL | 5 refills | Status: DC

## 2017-01-05 ENCOUNTER — Telehealth: Payer: Self-pay | Admitting: *Deleted

## 2017-01-05 ENCOUNTER — Other Ambulatory Visit: Payer: Self-pay | Admitting: Oncology

## 2017-01-05 NOTE — Telephone Encounter (Signed)
Ordered oncotype on core per Dr Jana Hakim.  Faxed requisition to pathology and confirmed receipt with [facility]

## 2017-01-07 ENCOUNTER — Ambulatory Visit: Payer: Commercial Managed Care - HMO | Admitting: Hematology

## 2017-01-09 ENCOUNTER — Other Ambulatory Visit: Payer: Self-pay | Admitting: Family Medicine

## 2017-01-10 NOTE — Telephone Encounter (Signed)
Medication refilled per protocol. 

## 2017-01-12 ENCOUNTER — Ambulatory Visit: Payer: Commercial Managed Care - HMO | Admitting: Hematology

## 2017-01-13 ENCOUNTER — Other Ambulatory Visit: Payer: Self-pay | Admitting: Oncology

## 2017-01-13 ENCOUNTER — Telehealth: Payer: Self-pay | Admitting: *Deleted

## 2017-01-13 DIAGNOSIS — C50912 Malignant neoplasm of unspecified site of left female breast: Secondary | ICD-10-CM | POA: Diagnosis not present

## 2017-01-13 DIAGNOSIS — Z170 Estrogen receptor positive status [ER+]: Secondary | ICD-10-CM | POA: Diagnosis not present

## 2017-01-13 NOTE — Telephone Encounter (Signed)
Received Oncotype Dx score of 22/14%.  Placed a copy in Dr. Jana Hakim & Keisha's boxes and took a copy to HIM to scan.

## 2017-01-13 NOTE — Progress Notes (Unsigned)
[facility]  [phone] [phone]) [phone]  Clinic follow up Note   Patient Care Team: Susy Frizzle, MD as PCP - General (Family Medicine) Truitt Merle, MD as Consulting Physician (Oncology) Excell Seltzer, MD as Consulting Physician (General Surgery) Thea Silversmith, MD (Inactive) as Consulting Physician (Radiation Oncology) Sylvan Cheese, NP as Nurse Practitioner (Hematology and Oncology) Druscilla Brownie, MD as Consulting Physician (Dermatology) 01/13/2017  CHIEF COMPLAINTS: Bilateral breast cancers  CURRENT TREATMENT: Anastrozole   Oncology History   Breast cancer of lower-inner quadrant of right female breast [facility])   Staging form: Breast, AJCC 7th Edition     Clinical stage from 01/02/2016: Stage IA (T1a, N0, M0) - Signed by Truitt Merle, MD on 01/14/2016     Pathologic stage from 01/27/2016: Stage IA (T1b, N0, cM0) - Signed by Truitt Merle, MD on 03/07/2016          Malignant neoplasm of lower-inner quadrant of right breast of female, estrogen receptor positive ([place])   12/29/2015 Mammogram    Diagnostic right mammogram showed a 2.3 x 2.1 x 2.2 cm group of coarse calcification within the medial slightly lower right breast.      01/02/2016 Initial Diagnosis    Breast cancer of lower-inner quadrant of right female breast ([place])      01/02/2016 Initial Biopsy    right breast LIQ core needle biopsy showed LCIS, with a small focus of invasive lobular carcinoma, grade 2.       01/02/2016 Receptors her2    ER 10% positive, moderate staining. PR negative. Insufficient tissue for HER-2 testing.      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 0/2 SLN.       01/27/2016 Receptors her2    The surgical sample of the invasive carcinoma was not sufficient for Her2 test and other additional studies.      03/09/2016 - 04/19/2016 Radiation Therapy    Adjuvant breast radiation [name]). Right breast: 50 Gy in 25 fractions. Right breast "boost": 10 Gy in 5 fractions.        04/2016 -  Anti-estrogen oral therapy    Anastrozole 1 mg daily. Planned duration of therapy: 5 years       05/24/2016 Imaging    DEXA scan: Normal (T-score -0.6)      06/21/2016 Imaging    MM and [place] Right Breast 06/21/2016 IMPRESSION: No evidence of malignancy within the right breast. Specifically, no evidence of malignancy within the outer right breast corresponding to the area of clinical concern. There are expected postsurgical changes within the inner right breast. RECOMMENDATION: Annual bilateral diagnostic mammograms. Next bilateral mammogram will be due in April of 2018. Benign causes of breast pain, and possible remedies, were discussed with the patient. Patient was encouraged to follow-up with referring physician if pain became localized and persistent or if a palpable lump/mass developed.      INTERIM HISTORY:  NEW LEFT BREAST CANCER Sandi was evaluated in the breast clinic 01/03/2017 accompanied by her husband and a close friend. She is establishing herself in my practice today.  The patient was seen by Dr. Burr Medico previously for a stage I estrogen receptor positive breast cancer, which was treated with lumpectomy and radiation and  then started on anastrozole. Rayelynn tells me that she has been taking her anastrozole faithfully. She has generally tolerated it well.  Nevertheless on 12/27/2016 when she had her annual mammography with tomography at the [facility] mass was noted in the contralateral, left breast upper inner quadrant. There were no suspicious calcifications. On exam a firm palpable mass was identified at the 11:00 position of the left breast 5 cm from the  nipple.  Targeted ultrasonography confirmed a 1.4 cm irregular hypoechoic mass at the area in question. The left axilla was sonographically benign.  Biopsy of the left breast mass in question 12/29/2016 showed ([id number]) invasive lobular carcinoma, E-cadherin negative, grade 2, HER-2 nonamplified, with a signals ratio of 1.35 and the number per cell 2.70. The rest of the upper gastric panel is pending.  The patient is here today to further discuss this diagnosis and to develop a plan.  REVIEW OF SYSTEMS: There were no specific symptoms leading to the diagnostic mammogram, which was routinely scheduled. The patient denies unusual headaches, visual changes, nausea, vomiting, stiff neck, dizziness, or gait imbalance. There has been no cough, phlegm production, or pleurisy, no chest pain or pressure, and no change in bowel or bladder habits. The patient denies fever, rash, bleeding, unexplained fatigue or unexplained weight loss. The patient does not exercise regularly but is very active vertically with her grandchildren. A detailed review of systems was otherwise entirely negative.   HISTORY OF RIGHT BREAST CANCER From Dr Ernestina Penna 01/14/16 note:  [name] 61 y.o. female is here because of Her newly diagnosed right breast cancer. She is accompanied by her husband and of friend to our multidisciplinary rest clinic today.  This was found by screening mammogram,  she denies any palpable mass, skin change or nipple discharge, or ulcer constitutional symptoms before the screening.  Her prior mammo 6 years ago. She had a right breast cyst removed in 1979.   She has hypertension, mild asthma, otherwise pretty healthy. She feels very well, no symptoms. She lives with her husband, her daughter, son-in-law and their 58-year-old branch regimen. She takes of her current child at home. She does not exercise regularly, but is very physically active. No family history of breast cancer or offering cancer.  Past  Medical History:  Diagnosis Date  . Allergy   . Anxiety   . Asthma   . Atherosclerosis of aorta (HCC)    bulge above renal arteries on mri 2016  . Cancer [facility]) 2017   right breast ca  . Complication of anesthesia   . Hypertension   . Personal history of radiation therapy   . PONV (postoperative nausea and vomiting)   . Smoker     SURGICAL HISTORY: Past Surgical History:  Procedure Laterality Date  . ABDOMINAL HYSTERECTOMY    . BREAST BIOPSY Right 01/02/2016  . BREAST EXCISIONAL BIOPSY Right 1979  . BREAST LUMPECTOMY Right 01/27/2016  . BREAST LUMPECTOMY WITH RADIOACTIVE SEED AND SENTINEL LYMPH NODE BIOPSY Right 01/27/2016   Procedure: BREAST LUMPECTOMY WITH RADIOACTIVE SEED AND SENTINEL LYMPH NODE BIOPSY;  Surgeon: Excell Seltzer, MD;  Location: [facility];  Service: General;  Laterality: Right;  . BREAST SURGERY  1979   cyst removed rt breast  . CESAREAN SECTION     x2   Family History  Problem Relation Age of Onset  . Hearing loss Mother   . Asthma Father   . Cancer Father        melanoma, metastases   .  Diabetes Father   . Heart disease Father   . Early death Sister   . Early death Paternal Grandfather   . Cancer Maternal Grandmother 45       colon cancer   The patient's father died at age 71. She had been diagnosed with melanoma in 1 year. That was not the cause of death. The patient's mother is living at age 29 as of May 2018. The patient has one brother, no sisters. There is no history of breast or ovarian cancer in the family, and no other melanoma history.  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 She frequently keeps her 2 grandchildren. Her husband Clair Gulling is a retired Airline pilot. He now owns a business where they prepare the front car of a trailer  hoarse hitch combination. Daughter April lives in [place] where she works as a Statistician. Daughter Ailene Ravel lives in [place] where she is an Forensic scientist for [employer]. The patient has 4 grandchildren. She is a Tourist information centre manager.   Social History   Social History  . Marital status: Married    Spouse name: N/A  . Number of children: N/A  . Years of education: N/A   Occupational History  . Not on file.   Social History Main Topics  . Smoking status: Former Smoker    Packs/day: 0.50    Years: 40.00    Types: Cigarettes  . Smokeless tobacco: Never Used  . Alcohol use 0.0 oz/week     Comment: social  . Drug use: No  . Sexual activity: Yes    Birth control/ protection: Surgical     Comment: hysterectomy   Other Topics Concern  . Not on file   Social History Narrative  . No narrative on file  ALLERGIES:  is allergic to penicillins and sulfa antibiotics.  MEDICATIONS:  Current Outpatient Prescriptions  Medication Sig Dispense Refill  . anastrozole (ARIMIDEX) 1 MG tablet Take 1 tablet (1 mg total) by mouth daily. 30 tablet 5  . ANORO ELLIPTA 62.5-25 MCG/INH AEPB INHALE 1 PUFF INTO THE LUNGS DAILY 60 each 10  . losartan-hydrochlorothiazide (HYZAAR) 50-12.5 MG tablet TAKE 2 TABLETS BY MOUTH DAILY. 60 tablet 5  . montelukast (SINGULAIR) 10 MG tablet TAKE 1 TABLET BY MOUTH EVERY DAY 90 tablet 0  . potassium chloride SA (K-DUR,KLOR-CON) 20 MEQ tablet Take 1 tablet (20 mEq total) by mouth daily. (Patient not taking: Reported on 09/20/2016) 30 tablet 0  . VENTOLIN HFA 108 (90 Base) MCG/ACT inhaler USE 1-2 PUFFS EVERY 6 HOURS AS NEEDED FOR WHEEZING 18 Inhaler 3   No current facility-administered medications for this visit.     PHYSICAL EXAMINATION: ECOG PERFORMANCE STATUS: 0 - Asymptomatic  There were no vitals filed for this visit. There were no vitals filed for this visit. Sclerae unicteric, pupils round and equal Oropharynx clear and moist No  cervical or supraclavicular adenopathy Lungs no rales or rhonchi Heart regular rate and rhythm Abd soft, nontender, positive bowel sounds MSK no focal spinal tenderness, no upper extremity lymphedema Neuro: nonfocal, well oriented, appropriate affect Breasts: The right breast is status post prior lumpectomy and radiation, with no evidence of local recurrence. The left breast is status post recent biopsy. There is a moderate ecchymosis. I do not palpate a mass  aside from the ecchymosis. There are no other skin or nipple changes of concern. Both axillae are benign.    LABORATORY DATA:  I have reviewed the data as listed Lab Results  Component Value Date   WBC 4.5 01/03/2017   HGB 12.3 01/03/2017   HCT 35.4 01/03/2017   MCV 88.0 01/03/2017   PLT 200 01/03/2017    Recent Labs  06/14/16 0828 09/20/16 1234 01/03/17 0922  NA 134* 130* 133*  K 3.1* 3.2* 3.4*  CO2 _0 GLUCOSE 83 142* 114  BUN 10.6 9.0 10.8  CREATININE 0.7 0.7 0.7  CALCIUM 9.5 9.2 9.5  PROT 7.4 7.1 7.1  ALBUMIN 3.7 3.8 3.8  AST _1 ALT _2 ALKPHOS 64 61 60  BILITOT 0.43 0.38 0.47   PATHOLOGY REPORT The available results of the left breast biopsy were discussed with the patient and provided to her. We also reviewed her earlier right sided biopsy and again those results were given to her in writing.  RADIOGRAPHIC STUDIES:  US Breast Ltd Uni Left Inc Axilla  Result Date: 12/27/2016 CLINICAL DATA:  61 year old female for annual bilateral mammograms. History of right breast cancer and lumpectomy and 2017. EXAM: 2D DIGITAL DIAGNOSTIC BILATERAL MAMMOGRAM WITH CAD AND ADJUNCT TOMO ULTRASOUND LEFT BREAST COMPARISON:  Previous exam(s). ACR Breast Density Category c: The breast tissue is heterogeneously dense, which may obscure small masses. FINDINGS: 2D and 3D full field views of both breasts, a magnification view of the right lumpectomy site and spot compression views of the left breast are performed. A  new irregular mass within the upper inner left breast noted. Surgical changes within the inner right breast are present. No suspicious calcifications are noted bilaterally. Mammographic images were processed with CAD. On physical exam, a firm palpable mass identified at the 11 o'clock position of the left breast 5 cm from the nipple. Targeted ultrasound is performed, showing a 1.1 x 1 x 1.4 cm irregular hypoechoic mass at the 11 o'clock position of the left breast 5 cm from the nipple. No abnormal left axillary lymph nodes are identified. IMPRESSION: Suspicious 1.4 cm mass in the upper inner left breast. Tissue sampling is recommended. No abnormal left axillary lymph nodes. No evidence of right breast malignancy. RECOMMENDATION: Ultrasound-guided left breast biopsy, which will be scheduled. I have discussed the findings and recommendations with the patient. Results were also provided in writing at the conclusion of the visit. If applicable, a reminder letter will be sent to the patient regarding the next appointment. BI-RADS CATEGORY  4: Suspicious. Electronically Signed   By: Margarette Canada M.D.   On: 12/27/2016 10:46   Mm Diag Breast Tomo Bilateral  Result Date: 12/27/2016 CLINICAL DATA:  61 year old female for annual bilateral mammograms. History of right breast cancer and lumpectomy and 2017. EXAM: 2D DIGITAL DIAGNOSTIC BILATERAL MAMMOGRAM WITH CAD AND ADJUNCT TOMO ULTRASOUND LEFT BREAST COMPARISON:  Previous exam(s). ACR Breast Density Category c: The breast tissue is heterogeneously dense, which may obscure small masses. FINDINGS: 2D and 3D full field views of both breasts, a magnification view of the right lumpectomy site and spot compression views of the left breast are performed. A new irregular mass within the upper inner left breast noted. Surgical changes within the inner right breast are present. No suspicious calcifications are noted bilaterally. Mammographic images were processed with CAD. On  physical exam, a firm palpable mass identified at the 11 o'clock position of the left breast 5 cm  from the nipple. Targeted ultrasound is performed, showing a 1.1 x 1 x 1.4 cm irregular hypoechoic mass at the 11 o'clock position of the left breast 5 cm from the nipple. No abnormal left axillary lymph nodes are identified. IMPRESSION: Suspicious 1.4 cm mass in the upper inner left breast. Tissue sampling is recommended. No abnormal left axillary lymph nodes. No evidence of right breast malignancy. RECOMMENDATION: Ultrasound-guided left breast biopsy, which will be scheduled. I have discussed the findings and recommendations with the patient. Results were also provided in writing at the conclusion of the visit. If applicable, a reminder letter will be sent to the patient regarding the next appointment. BI-RADS CATEGORY  4: Suspicious. Electronically Signed   By: Margarette Canada M.D.   On: 12/27/2016 10:46   Mm Clip Placement Left  Result Date: 12/29/2016 CLINICAL DATA:  Status post ultrasound-guided core needle biopsy of a 1.4 cm mass in the 11 o'clock position of the left breast. EXAM: DIAGNOSTIC LEFT MAMMOGRAM POST ULTRASOUND BIOPSY COMPARISON:  Previous exam(s). FINDINGS: Mammographic images were obtained following ultrasound guided biopsy of a 1.4 cm mass in the 11 o'clock position of the left breast, 5 cm from the nipple. These demonstrate a ribbon shaped biopsy marker clip at the expected location of the mass. IMPRESSION: Appropriate clip deployment following left breast ultrasound-guided core needle biopsy. Final Assessment: Post Procedure Mammograms for Marker Placement Electronically Signed   By: Claudie Revering M.D.   On: 12/29/2016 16:34   [place] Lt Breast Bx W Loc Dev 1st Lesion Img Bx Spec US Guide  Addendum Date: 12/30/2016   ADDENDUM REPORT: 12/30/2016 11:20 ADDENDUM: Pathology revealed grade II invasive mammary carcinoma in the left breast. This was found to be concordant by Dr. Claudie Revering. Pathology  results were discussed with the patient by telephone. The patient reported doing well after the biopsy with bruising at the site. Post biopsy instructions and care were reviewed and questions were answered. The patient was encouraged to call [facility] for any additional concerns. The patient has an appointment with Dr. Truitt Merle on Jan 07, 2017, and would like to discuss the results with her prior to scheduling a surgical consultation. Pathology results reported by Susa Raring RN, BSN on 12/30/2016. Electronically Signed   By: Claudie Revering M.D.   On: 12/30/2016 11:20   Result Date: 12/30/2016 CLINICAL DATA:  1.4 cm mass with recent imaging features suspicious for malignancy in the 11 o'clock position of the left breast. EXAM: ULTRASOUND GUIDED LEFT BREAST CORE NEEDLE BIOPSY COMPARISON:  Previous exam(s). FINDINGS: I met with the patient and we discussed the procedure of ultrasound-guided biopsy, including benefits and alternatives. We discussed the high likelihood of a successful procedure. We discussed the risks of the procedure, including infection, bleeding, tissue injury, clip migration, and inadequate sampling. Informed written consent was given. The usual time-out protocol was performed immediately prior to the procedure. Lesion quadrant: Left upper inner quadrant Using sterile technique and 1% Lidocaine as local anesthetic, under direct ultrasound visualization, a 14 gauge spring-loaded device was used to perform biopsy of the recently demonstrated 1.4 cm mass in the 11 o'clock position of the left breast, 5 cm from the nipple, using a caudal approach. At the conclusion of the procedure a ribbon shaped tissue marker clip was deployed into the biopsy cavity. Follow up 2 view mammogram was performed and dictated separately. IMPRESSION: Ultrasound guided biopsy of a 1.4 cm mass in the 11 o'clock position of the left  breast. No apparent complications. Electronically Signed: By: Claudie Revering M.D. On: 12/29/2016 16:15    ASSESSMENT: 61 y.o. [place] woman, with bilateral breast cancer  (1) status post right lumpectomy and sentinel lymph node sampling 01/27/2016 for lobular carcinoma in situ, pleomorphic variant, with multiple foci of invasive lobular carcinoma, all less than a millimeter, but with negative margins and both sentinel lymph nodes negative, invasive disease being estrogen receptor positive, HER-2 not tested (pT1 [mic] pN0}  (2) adjuvant radiation 03/09/2016 through 04/19/2016 The patient initially received a dose of 50 Gy in 25 fractions to the breast using whole-breast tangent fields. This was delivered using a 3-D conformal technique. The patient then received a boost to the seroma. This delivered an additional 10 Gy in 5 fractions using an en face electron field due to the depth of the seroma. The total dose was 60 Gy.  (3) anastrozole started June 2017  (4) status post left breast lower inner quadrant biopsy 12/29/2016 for a clinical T1c N0  invasive lobular breast cancer, grade 2, HER-2 nonamplified, with a signals ratio 1.35 and the number per cell 2.70.  PLAN: We spent approximately one hour today going over her Idamay's complex situation. She understands her original cancer was microinvasive, and was appropriately treated with surgery, radiation, and anastrozole. She now has a different cancer, in the contralateral breast--not a recurrence of her original cancer.  What is disturbing is that this cancer did arise in the setting of adequate estrogen blockade with anastrozole. The patient tells me she seldom if ever missed and anastrozole dose.  Of course if the new, left-sided breast cancer is estrogen and progesterone receptor negative, then this could be explained. If it is estrogen receptor positive however we have estrogen resistance in a lobular breast cancer and this is of concern.  We discussed the difference between ductal and lobular breast  cancers in detail. The patient understands that lobular breast cancers do tend more frequently to arise bilaterally, and in those cases we think more there is a "field defect". With this means is that she is at high risk of developing yet another breast cancer after we have taking care of the new, left-sided 1.  For this reason the patient is strongly considering bilateral mastectomies. She understands there is no survival to bilateral mastectomies as compared to lumpectomy alone followed by radiation on the left, just as she did on the right. The reason she is considering bilateral mastectomies is not survival, but the concern anxiety and general life disturbance of having yet another breast cancer recurrence. In her situation I think this concern is entirely appropriate  She understands that the decision regarding systemic therapy has nothing to do with her local treatment decision. Even if she has both breasts removed she still may need chemotherapy, if this cancer is estrogen receptor negative, or if it is HER-2 positive. If the cancer is estrogen receptor positive we would consider sending an Oncotype despite the fact that it arose in the setting of anastrozole.  Because we do not yet have the estrogen receptor status I am not changing her anastrozole to tamoxifen, but that would be the next move if the cancer proved to be estrogen receptor positive.  I'm referring her back to her surgeon to discuss a lateral mastectomies as well as reconstruction options. I will see her again after the definitive surgery.  She knows to call for any problems that may develop before the next visit here.   Chauncey Cruel, MD  01/13/2017

## 2017-01-17 ENCOUNTER — Other Ambulatory Visit: Payer: Self-pay | Admitting: Family Medicine

## 2017-01-17 ENCOUNTER — Ambulatory Visit (HOSPITAL_BASED_OUTPATIENT_CLINIC_OR_DEPARTMENT_OTHER): Payer: Commercial Managed Care - HMO | Admitting: Oncology

## 2017-01-17 ENCOUNTER — Telehealth: Payer: Self-pay | Admitting: *Deleted

## 2017-01-17 VITALS — BP 149/67 | HR 103 | Temp 97.9°F | Resp 20 | Ht 61.0 in | Wt 127.9 lb

## 2017-01-17 DIAGNOSIS — Z170 Estrogen receptor positive status [ER+]: Secondary | ICD-10-CM | POA: Diagnosis not present

## 2017-01-17 DIAGNOSIS — C50311 Malignant neoplasm of lower-inner quadrant of right female breast: Secondary | ICD-10-CM

## 2017-01-17 DIAGNOSIS — C50212 Malignant neoplasm of upper-inner quadrant of left female breast: Secondary | ICD-10-CM

## 2017-01-17 NOTE — Telephone Encounter (Signed)
Spoke with patient about her oncotype results.  Patient will come in today 5/23 to see Dr. Jana Hakim at [facility].  Patient aware.

## 2017-01-17 NOTE — Progress Notes (Signed)
[place]  Telephone:([phone]) [phone] Fax:(336) [phone]     ID: DEKISHA MESMER DOB: 12-02-1955  MR#: [id number]  RSW#:[id number]  Patient Care Team: Susy Frizzle, MD as PCP - General (Family Medicine) Truitt Merle, MD as Consulting Physician (Oncology) Excell Seltzer, MD as Consulting Physician (General Surgery) Thea Silversmith, MD (Inactive) as Consulting Physician (Radiation Oncology) Sylvan Cheese, NP as Nurse Practitioner (Hematology and Oncology) Druscilla Brownie, MD as Consulting Physician (Dermatology) Chauncey Cruel, MD OTHER MD:  CHIEF COMPLAINT: Recurrent estrogen receptor positive breast cancer  CURRENT TREATMENT: Awaiting definitive surgery  INTERVAL HISTORY: Xochil returns today for follow-up of her recurrent estrogen receptor positive breast cancer, accompanied by her husband and a close friend. Since her last visit here she underwent left breast upper inner quadrant biopsy 12/29/2016 showing an invasive lobular carcinoma, estrogen receptor 100% positive with strong staining intensity but progesterone receptor negative, with an MIB-1 of 15%, and no HER-2 amplification, the signals ratio being 1.35 and the number per cell 2.70.  An Oncotype was obtained from this material and it predicts a risk of recurrence outside the breast of 14% if the patient's only systemic therapy is tamoxifen for 5 years. The patient is here to consider adjuvant chemotherapy.  REVIEW OF SYSTEMS: Sharmeka did well with the biopsy, with no unusual side effects. She has met with her surgeon Dr. Excell Seltzer and is planning to meet with Dr. telling him to consider bilateral reconstruction after bilateral mastectomies which continues to be the patient's choice. Frimy is still on anastrozole, with some hot flashes as her only side effect. Aside from this a detailed review of systems today was stable   BREAST CANCER HISTORY: From Dr Ernestina Penna 01/14/16 note:  "[name] 61 y.o.  female is here because of Her newly diagnosed right breast cancer. She is accompanied by her husband and of friend to our multidisciplinary rest clinic today.  This was found by screening mammogram,  she denies any palpable mass, skin change or nipple discharge, or ulcer constitutional symptoms before the screening.  Her prior mammo 6 years ago. She had a right breast cyst removed in 1979. "  Malignant neoplasm of lower-inner quadrant of right breast of female, estrogen receptor positive ([place])   12/29/2015 Mammogram    Diagnostic right mammogram showed a 2.3 x 2.1 x 2.2 cm group of coarse calcification within the medial slightly lower right breast.      01/02/2016 Initial Diagnosis    Breast cancer of lower-inner quadrant of right female breast ([place])      01/02/2016 Initial Biopsy    right breast LIQ core needle biopsy showed LCIS, with a small focus of invasive lobular carcinoma, grade 2.       01/02/2016 Receptors her2    ER 10% positive, moderate staining. PR negative. Insufficient tissue for HER-2 testing.      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 0/2 SLN.       01/27/2016 Receptors her2    The surgical sample of the invasive carcinoma was not sufficient for Her2 test and other additional studies.      03/09/2016 - 04/19/2016 Radiation Therapy    Adjuvant breast radiation [name]). Right breast: 50 Gy in  25 fractions. Right breast "boost": 10 Gy in 5 fractions.        04/2016 -  Anti-estrogen oral therapy    Anastrozole 1 mg daily. Planned duration of therapy: 5 years        PAST MEDICAL HISTORY: Past Medical History:  Diagnosis Date  . Allergy   .  Anxiety   . Asthma   . Atherosclerosis of aorta (HCC)    bulge above renal arteries on mri 2016  . Cancer [facility]) 2017   right breast ca  . Complication of anesthesia   . Hypertension   . Personal history of radiation therapy   . PONV (postoperative nausea and vomiting)   . Smoker     PAST SURGICAL HISTORY: Past Surgical History:  Procedure Laterality Date  . ABDOMINAL HYSTERECTOMY    . BREAST BIOPSY Right 01/02/2016  . BREAST EXCISIONAL BIOPSY Right 1979  . BREAST LUMPECTOMY Right 01/27/2016  . BREAST LUMPECTOMY WITH RADIOACTIVE SEED AND SENTINEL LYMPH NODE BIOPSY Right 01/27/2016   Procedure: BREAST LUMPECTOMY WITH RADIOACTIVE SEED AND SENTINEL LYMPH NODE BIOPSY;  Surgeon: Excell Seltzer, MD;  Location: [place];  Service: General;  Laterality: Right;  . BREAST SURGERY  1979   cyst removed rt breast  . CESAREAN SECTION     x2    FAMILY HISTORY Family History  Problem Relation Age of Onset  . Hearing loss Mother   . Asthma Father   . Cancer Father        melanoma, metastases   . Diabetes Father   . Heart disease Father   . Early death Sister   . Early death Paternal Grandfather   . Cancer Maternal Grandmother 2       colon cancer   The patient's father died at age 63. She had been diagnosed with melanoma in 1 year. That was not the cause of death. The patient's mother is living at age 57 as of May 2018. The patient has one brother, no sisters. There is no history of breast or ovarian cancer in the family, and no other melanoma history.  GYNECOLOGIC HISTORY:  No LMP recorded. Patient has had a hysterectomy. 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 She frequently keeps her 2 grandchildren. Her husband Clair Gulling is a retired Airline pilot. He now owns a business where they  prepare the front car of a trailer hoarse hitch combination. Daughter April lives in [place] where she works as a Statistician. Daughter Ailene Ravel lives in [place] where she is an Forensic scientist for [employer]. The patient has 4 grandchildren. She is a Tourist information centre manager.    ADVANCED DIRECTIVES:    HEALTH MAINTENANCE: Social History  Substance Use Topics  . Smoking status: Former Smoker    Packs/day: 0.50    Years: 40.00    Types: Cigarettes  . Smokeless tobacco: Never Used  . Alcohol use 0.0 oz/week     Comment: social     Colonoscopy:  PAP:  Bone density:   Allergies  Allergen Reactions  . Penicillins Swelling and Rash    Older sister died from reaction to penicillin  . Sulfa Antibiotics Rash    Current Outpatient Prescriptions  Medication Sig Dispense Refill  . anastrozole (ARIMIDEX) 1 MG tablet Take 1 tablet (1 mg total)  by mouth daily. 30 tablet 5  . ANORO ELLIPTA 62.5-25 MCG/INH AEPB INHALE 1 PUFF INTO THE LUNGS DAILY 60 each 10  . losartan-hydrochlorothiazide (HYZAAR) 50-12.5 MG tablet TAKE 2 TABLETS BY MOUTH DAILY. 60 tablet 5  . losartan-hydrochlorothiazide (HYZAAR) 50-12.5 MG tablet TAKE 2 TABLETS BY MOUTH DAILY. 60 tablet 5  . montelukast (SINGULAIR) 10 MG tablet TAKE 1 TABLET BY MOUTH EVERY DAY 90 tablet 0  . potassium chloride SA (K-DUR,KLOR-CON) 20 MEQ tablet Take 1 tablet (20 mEq total) by mouth daily. (Patient not taking: Reported on 09/20/2016) 30 tablet 0  . VENTOLIN HFA 108 (90 Base) MCG/ACT inhaler USE 1-2 PUFFS EVERY 6 HOURS AS NEEDED FOR WHEEZING 18 Inhaler 3   No current facility-administered medications for this visit.     OBJECTIVE: Middle-aged white woman who appears well Vitals:   01/17/17 1658  BP: (!) 149/67  Pulse: (!) 103  Resp: 20  Temp: 97.9 F (36.6 C)     Body mass index is 24.17 kg/m.    ECOG FS:0 - Asymptomatic  Sclerae unicteric, EOMs intact Oropharynx clear and moist No cervical or supraclavicular  adenopathy Lungs no rales or rhonchi Heart regular rate and rhythm Abd soft, nontender, positive bowel sounds MSK no focal spinal tenderness, no upper extremity lymphedema Neuro: nonfocal, well oriented, appropriate affect Breasts: Deferred   LAB RESULTS:  CMP     Component Value Date/Time   NA 133 (L) 01/03/2017 0922   K 3.4 (L) 01/03/2017 0922   CL 95 (L) 02/17/2015 0814   CO2 29 01/03/2017 0922   GLUCOSE 114 01/03/2017 0922   BUN 10.8 01/03/2017 0922   CREATININE 0.7 01/03/2017 0922   CALCIUM 9.5 01/03/2017 0922   PROT 7.1 01/03/2017 0922   ALBUMIN 3.8 01/03/2017 0922   AST 18 01/03/2017 0922   ALT 19 01/03/2017 0922   ALKPHOS 60 01/03/2017 0922   BILITOT 0.47 01/03/2017 0922   GFRNONAA >89 02/17/2015 0814   GFRAA >89 02/17/2015 0814    No results found for: Ronnald Ramp, A1GS, A2GS, BETS, BETA2SER, GAMS, MSPIKE, SPEI  No results found for: Nils Pyle, [facility]  Lab Results  Component Value Date   WBC 4.5 01/03/2017   NEUTROABS 3.1 01/03/2017   HGB 12.3 01/03/2017   HCT 35.4 01/03/2017   MCV 88.0 01/03/2017   PLT 200 01/03/2017      Chemistry      Component Value Date/Time   NA 133 (L) 01/03/2017 0922   K 3.4 (L) 01/03/2017 0922   CL 95 (L) 02/17/2015 0814   CO2 29 01/03/2017 0922   BUN 10.8 01/03/2017 0922   CREATININE 0.7 01/03/2017 0922      Component Value Date/Time   CALCIUM 9.5 01/03/2017 0922   ALKPHOS 60 01/03/2017 0922   AST 18 01/03/2017 0922   ALT 19 01/03/2017 0922   BILITOT 0.47 01/03/2017 0922       No results found for: LABCA2  No components found for: [id number]  No results for input(s): INR in the last 168 hours.  Urinalysis No results found for: COLORURINE, APPEARANCEUR, LABSPEC, PHURINE, GLUCOSEU, HGBUR, BILIRUBINUR, KETONESUR, PROTEINUR, UROBILINOGEN, NITRITE, LEUKOCYTESUR   STUDIES: US Breast Ltd Uni Left Inc Axilla  Result Date: 12/27/2016 CLINICAL DATA:  61 year old female for annual  bilateral mammograms. History of right breast cancer and lumpectomy and 2017. EXAM: 2D DIGITAL DIAGNOSTIC BILATERAL MAMMOGRAM WITH CAD AND ADJUNCT TOMO ULTRASOUND LEFT BREAST COMPARISON:  Previous exam(s). ACR Breast Density Category c: The breast tissue is heterogeneously dense,  which may obscure small masses. FINDINGS: 2D and 3D full field views of both breasts, a magnification view of the right lumpectomy site and spot compression views of the left breast are performed. A new irregular mass within the upper inner left breast noted. Surgical changes within the inner right breast are present. No suspicious calcifications are noted bilaterally. Mammographic images were processed with CAD. On physical exam, a firm palpable mass identified at the 11 o'clock position of the left breast 5 cm from the nipple. Targeted ultrasound is performed, showing a 1.1 x 1 x 1.4 cm irregular hypoechoic mass at the 11 o'clock position of the left breast 5 cm from the nipple. No abnormal left axillary lymph nodes are identified. IMPRESSION: Suspicious 1.4 cm mass in the upper inner left breast. Tissue sampling is recommended. No abnormal left axillary lymph nodes. No evidence of right breast malignancy. RECOMMENDATION: Ultrasound-guided left breast biopsy, which will be scheduled. I have discussed the findings and recommendations with the patient. Results were also provided in writing at the conclusion of the visit. If applicable, a reminder letter will be sent to the patient regarding the next appointment. BI-RADS CATEGORY  4: Suspicious. Electronically Signed   By: Margarette Canada M.D.   On: 12/27/2016 10:46   Mm Diag Breast Tomo Bilateral  Result Date: 12/27/2016 CLINICAL DATA:  61 year old female for annual bilateral mammograms. History of right breast cancer and lumpectomy and 2017. EXAM: 2D DIGITAL DIAGNOSTIC BILATERAL MAMMOGRAM WITH CAD AND ADJUNCT TOMO ULTRASOUND LEFT BREAST COMPARISON:  Previous exam(s). ACR Breast Density  Category c: The breast tissue is heterogeneously dense, which may obscure small masses. FINDINGS: 2D and 3D full field views of both breasts, a magnification view of the right lumpectomy site and spot compression views of the left breast are performed. A new irregular mass within the upper inner left breast noted. Surgical changes within the inner right breast are present. No suspicious calcifications are noted bilaterally. Mammographic images were processed with CAD. On physical exam, a firm palpable mass identified at the 11 o'clock position of the left breast 5 cm from the nipple. Targeted ultrasound is performed, showing a 1.1 x 1 x 1.4 cm irregular hypoechoic mass at the 11 o'clock position of the left breast 5 cm from the nipple. No abnormal left axillary lymph nodes are identified. IMPRESSION: Suspicious 1.4 cm mass in the upper inner left breast. Tissue sampling is recommended. No abnormal left axillary lymph nodes. No evidence of right breast malignancy. RECOMMENDATION: Ultrasound-guided left breast biopsy, which will be scheduled. I have discussed the findings and recommendations with the patient. Results were also provided in writing at the conclusion of the visit. If applicable, a reminder letter will be sent to the patient regarding the next appointment. BI-RADS CATEGORY  4: Suspicious. Electronically Signed   By: Margarette Canada M.D.   On: 12/27/2016 10:46   Mm Clip Placement Left  Result Date: 12/29/2016 CLINICAL DATA:  Status post ultrasound-guided core needle biopsy of a 1.4 cm mass in the 11 o'clock position of the left breast. EXAM: DIAGNOSTIC LEFT MAMMOGRAM POST ULTRASOUND BIOPSY COMPARISON:  Previous exam(s). FINDINGS: Mammographic images were obtained following ultrasound guided biopsy of a 1.4 cm mass in the 11 o'clock position of the left breast, 5 cm from the nipple. These demonstrate a ribbon shaped biopsy marker clip at the expected location of the mass. IMPRESSION: Appropriate clip  deployment following left breast ultrasound-guided core needle biopsy. Final Assessment: Post Procedure Mammograms for Marker Placement Electronically Signed  By: Claudie Revering M.D.   On: 12/29/2016 16:34   [place] Lt Breast Bx W Loc Dev 1st Lesion Img Bx Spec US Guide  Addendum Date: 12/30/2016   ADDENDUM REPORT: 12/30/2016 11:20 ADDENDUM: Pathology revealed grade II invasive mammary carcinoma in the left breast. This was found to be concordant by Dr. Claudie Revering. Pathology results were discussed with the patient by telephone. The patient reported doing well after the biopsy with bruising at the site. Post biopsy instructions and care were reviewed and questions were answered. The patient was encouraged to call [facility] for any additional concerns. The patient has an appointment with Dr. Truitt Merle on Jan 07, 2017, and would like to discuss the results with her prior to scheduling a surgical consultation. Pathology results reported by Susa Raring RN, BSN on 12/30/2016. Electronically Signed   By: Claudie Revering M.D.   On: 12/30/2016 11:20   Result Date: 12/30/2016 CLINICAL DATA:  1.4 cm mass with recent imaging features suspicious for malignancy in the 11 o'clock position of the left breast. EXAM: ULTRASOUND GUIDED LEFT BREAST CORE NEEDLE BIOPSY COMPARISON:  Previous exam(s). FINDINGS: I met with the patient and we discussed the procedure of ultrasound-guided biopsy, including benefits and alternatives. We discussed the high likelihood of a successful procedure. We discussed the risks of the procedure, including infection, bleeding, tissue injury, clip migration, and inadequate sampling. Informed written consent was given. The usual time-out protocol was performed immediately prior to the procedure. Lesion quadrant: Left upper inner quadrant Using sterile technique and 1% Lidocaine as local anesthetic, under direct ultrasound visualization, a 14 gauge spring-loaded device was used to  perform biopsy of the recently demonstrated 1.4 cm mass in the 11 o'clock position of the left breast, 5 cm from the nipple, using a caudal approach. At the conclusion of the procedure a ribbon shaped tissue marker clip was deployed into the biopsy cavity. Follow up 2 view mammogram was performed and dictated separately. IMPRESSION: Ultrasound guided biopsy of a 1.4 cm mass in the 11 o'clock position of the left breast. No apparent complications. Electronically Signed: By: Claudie Revering M.D. On: 12/29/2016 16:15    ELIGIBLE FOR AVAILABLE RESEARCH PROTOCOL: no  ASSESSMENT: 61 y.o. [place] woman, with bilateral breast cancer  (1) status post right lumpectomy and sentinel lymph node sampling 01/27/2016 for lobular carcinoma in situ, pleomorphic variant, with multiple foci of invasive lobular carcinoma, all less than a millimeter, but with negative margins and both sentinel lymph nodes negative, invasive disease being estrogen receptor positive, HER-2 not tested (pT1 [mic] pN0}  (2) adjuvant radiation 03/09/2016 through 04/19/2016 The patient initially received a dose of 50Gy in 52factions to the breast using whole-breast tangent fields. This was delivered using a 3-D conformal technique. The patient then received a boost to the seroma. This delivered an additional 10Gy in 5109fctions using an en face electron field due to the depth of the seroma. The total dose was 60Gy.  (3) anastrozole started June 2017  (4) status post left breast lower inner quadrant biopsy 12/29/2016 for a clinical T1c N0  invasive lobular breast cancer, grade 2, HER-2 nonamplified, with a signals ratio 1.35 and the number per cell 2.70.  (5) intermediate Oncotype score predicts a 10 year risk of recurrence outside the breast of 14% if the patient's only systemic therapy is tamoxifen for 5 years.  PLAN: I spent approximately 45 minutes today with MaStanton Kidneynd her support group going over her situation regarding  chemotherapy.  She understands that her Oncotype score falls into the intermediate range. We do not have heart data on exactly what the risk is for patients in this situation and how much chemotherapy may or may not benefit them. This data will be presented in June at the upcoming ASCO meeting. Until then a common pattern has been to simply divide the test in half and say that numbers 25 and above will get chemotherapy numbers 24 below will not get chemotherapy. I am not aware of any prospective data or any hard data regarding this approach  Accordingly I prefer to discuss the specific risks and benefits with the patient. In her case, since her residual risk after 5 years of tamoxifen would be 14% in terms of systemic recurrence, and his chemotherapy generally reduces risk by approximately one third, she would have a 4-5% risk reduction if she took chemotherapy.  However we do have to modify that because if instead of taking tamoxifen for 5 years she took tamoxifen for 10 years for example she would have a further risk reduction without chemotherapy. Accordingly I think using 4% as a better choice in her case.  This means that if I had 100 women like her all of whom to chemotherapy in addition to tamoxifen, 86 did not begin any treatment because there were already "cured", and another 10% would have the cancer come back despite receiving chemotherapy. This means 96% of women like her would get no benefit from chemotherapy.  The confounding factor is the fact that the other for women get a very big benefit. There were going to have incurable, stage IV breast cancer and they did not.  This is a difficult choice and I think only the patient can make it. After much discussion today very was very clear that she would not want to receive chemotherapy for this marginal benefit.  Accordingly the plan will be for her to proceed to surgery as she plans, which currently involved bilateral mastectomy with  immediate reconstruction.  She will see me again late June or early July. At that point we will start tamoxifen.  Sebrena has a good understanding of the overall plan. She agrees with it. She knows the goal of treatment in her case is cure. She will call with any problems that may develop before her next visit here.  Chauncey Cruel, MD   01/17/2017 8:32 PM Medical Oncology and Hematology [facility] [address] [place], [place] [address] Tel. [phone]    Fax. [phone]

## 2017-01-27 DIAGNOSIS — Z853 Personal history of malignant neoplasm of breast: Secondary | ICD-10-CM | POA: Insufficient documentation

## 2017-01-27 DIAGNOSIS — C50212 Malignant neoplasm of upper-inner quadrant of left female breast: Secondary | ICD-10-CM | POA: Diagnosis not present

## 2017-01-27 DIAGNOSIS — Z170 Estrogen receptor positive status [ER+]: Secondary | ICD-10-CM | POA: Diagnosis not present

## 2017-01-28 ENCOUNTER — Ambulatory Visit: Payer: Self-pay | Admitting: General Surgery

## 2017-01-28 DIAGNOSIS — C50912 Malignant neoplasm of unspecified site of left female breast: Secondary | ICD-10-CM

## 2017-02-10 ENCOUNTER — Ambulatory Visit: Payer: Commercial Managed Care - HMO | Admitting: Oncology

## 2017-02-14 ENCOUNTER — Ambulatory Visit: Payer: Self-pay | Admitting: Plastic Surgery

## 2017-02-14 ENCOUNTER — Telehealth: Payer: Self-pay | Admitting: *Deleted

## 2017-02-14 DIAGNOSIS — Z170 Estrogen receptor positive status [ER+]: Principal | ICD-10-CM

## 2017-02-14 DIAGNOSIS — C50912 Malignant neoplasm of unspecified site of left female breast: Secondary | ICD-10-CM

## 2017-02-14 NOTE — H&P (Signed)
Krystal Brooks is an 61 y.o. female.   Chief Complaint: breast cancer HPI: The patient is a 61 y.o. yrs old wf here for breast reconstruction.  She was recently diagnosed with LEFT breast cancer.  She has been getting a mammogram every 6 months because of her right breast cancer last year.  She underwent a lumpectomy with radiation for treatment.  She now has invasive lobular carcinoma that is ER positive, PR negative and HER-2 negative.  The right side was also lobular carcinoma.  She was treated with Arimidex.  She is not sure about chemotherapy but leaning toward not undergoing it.   She is 5 feet 1 inch tall and weight is 128 pounds.  Preop bra = 34 - 36 C.  She is interested in bilateral mastectomies with immediate reconstruction.  She was referred by Dr. Excell Seltzer.  Past Medical History:  Diagnosis Date  . Allergy   . Anxiety   . Asthma   . Atherosclerosis of aorta (HCC)    bulge above renal arteries on mri 2016  . Cancer [facility]) 2017   right breast ca  . Complication of anesthesia   . Hypertension   . Personal history of radiation therapy   . PONV (postoperative nausea and vomiting)   . Smoker     Past Surgical History:  Procedure Laterality Date  . ABDOMINAL HYSTERECTOMY    . BREAST BIOPSY Right 01/02/2016  . BREAST EXCISIONAL BIOPSY Right 1979  . BREAST LUMPECTOMY Right 01/27/2016  . BREAST LUMPECTOMY WITH RADIOACTIVE SEED AND SENTINEL LYMPH NODE BIOPSY Right 01/27/2016   Procedure: BREAST LUMPECTOMY WITH RADIOACTIVE SEED AND SENTINEL LYMPH NODE BIOPSY;  Surgeon: Excell Seltzer, MD;  Location: [facility];  Service: General;  Laterality: Right;  . BREAST SURGERY  1979   cyst removed rt breast  . CESAREAN SECTION     x2    Family History  Problem Relation Age of Onset  . Hearing loss Mother   . Asthma Father   . Cancer Father        melanoma, metastases   . Diabetes Father   . Heart disease Father   . Early death Sister   . Early death Paternal  Grandfather   . Cancer Maternal Grandmother 42       colon cancer    Social History:  reports that she has quit smoking. Her smoking use included Cigarettes. She has a 20.00 pack-year smoking history. She has never used smokeless tobacco. She reports that she drinks alcohol. She reports that she does not use drugs.  Allergies:  Allergies  Allergen Reactions  . Penicillins Swelling and Rash    Older sister died from reaction to penicillin  . Sulfa Antibiotics Rash     (Not in a hospital admission)  No results found for this or any previous visit (from the past 48 hour(s)). No results found.  Review of Systems  Constitutional: Negative.   HENT: Negative.   Eyes: Negative.   Respiratory: Negative.   Cardiovascular: Negative.   Gastrointestinal: Negative.   Genitourinary: Negative.   Musculoskeletal: Negative.   Skin: Negative.   Neurological: Negative.   Psychiatric/Behavioral: Negative.     There were no vitals taken for this visit. Physical Exam  Constitutional: She is oriented to person, place, and time. She appears well-developed and well-nourished.  HENT:  Head: Normocephalic and atraumatic.  Eyes: EOM are normal. Pupils are equal, round, and reactive to light.  Neck: Normal range of motion.  Cardiovascular: Normal  rate.   Respiratory: Effort normal.  GI: Soft. She exhibits no distension. There is no tenderness.  Musculoskeletal: Normal range of motion.  Neurological: She is alert and oriented to person, place, and time.  Skin: Skin is warm. No erythema.  Psychiatric: She has a normal mood and affect. Her behavior is normal. Judgment and thought content normal.     Assessment/Plan Once all reconstruction options were presented, a focused discussion was had regarding the patient's suitability for each of these procedures.  A total of 50 minutes of face-to-face time was spent in this encounter, of which >50% was spent in counseling. Due to the radiation history on  the right a latissimus is recommended.  She would like to avoid a muscle flap and is aware of the risks.  She is also aware it will limit her size.  We discussed immediate bilateral not nipple sparing reconstruction with expander and FlexHD.        Wallace Going, DO 02/14/2017, 1:12 PM

## 2017-02-14 NOTE — Telephone Encounter (Signed)
Received call from patient stating she has an appointment with Dr. Jana Hakim 6/25 but her surgery is not scheduled until 7/18. She was wanting to know if she should reschedule till after her surgery. Rescheduled appointment to 03/23/17 at 930am.  Patient aware.

## 2017-02-21 ENCOUNTER — Other Ambulatory Visit: Payer: Self-pay | Admitting: Family Medicine

## 2017-02-21 ENCOUNTER — Ambulatory Visit: Payer: Commercial Managed Care - HMO | Admitting: Oncology

## 2017-02-28 NOTE — Pre-Procedure Instructions (Signed)
CHASTITY NOLAND  02/28/2017      [id number] - [place], [place] - [address] [place] [address] Phone: [phone] Fax: [phone]    Your procedure is scheduled on July 18  Report to [place] at [place].M.  Call this number if you have problems the morning of surgery:  [phone]   Remember:  Do not eat food or drink liquids after midnight. Except follow these instructions  Please complete your 8oz of Boost Breeze  that was given to you at your preadmission appointment by 0615 on the day of your surgery   Take these medicines the morning of surgery with A SIP OF WATER eye drops if needed, VENTOLIN HFA if needed and bring with you the morning of surgery  7 days prior to surgery STOP taking any Aspirin, Aleve, Naproxen, Ibuprofen, Motrin, Advil, Goody's, BC's, all herbal medications, fish oil, and all vitamins    Do not wear jewelry, make-up or nail polish.  Do not wear lotions, powders, or perfumes, or deoderant.  Do not shave 48 hours prior to surgery.    Do not bring valuables to the hospital.  [facility] is not responsible for any belongings or valuables.  Contacts, dentures or bridgework may not be worn into surgery.  Leave your suitcase in the car.  After surgery it may be brought to your room.  For patients admitted to the hospital, discharge time will be determined by your treatment team.  Patients discharged the day of surgery will not be allowed to drive home.    Special instructions:   [place]- Preparing For Surgery  Before surgery, you can play an important role. Because skin is not sterile, your skin needs to be as free of germs as possible. You can reduce the number of germs on your skin by washing with CHG (chlorahexidine gluconate) Soap before surgery.  CHG is an antiseptic cleaner which kills germs and bonds with the skin to continue killing germs even  after washing.  Please do not use if you have an allergy to CHG or antibacterial soaps. If your skin becomes reddened/irritated stop using the CHG.  Do not shave (including legs and underarms) for at least 48 hours prior to first CHG shower. It is OK to shave your face.  Please follow these instructions carefully.   1. Shower the NIGHT BEFORE SURGERY and the MORNING OF SURGERY with CHG.   2. If you chose to wash your hair, wash your hair first as usual with your normal shampoo.  3. After you shampoo, rinse your hair and body thoroughly to remove the shampoo.  4. Use CHG as you would any other liquid soap. You can apply CHG directly to the skin and wash gently with a scrungie or a clean washcloth.   5. Apply the CHG Soap to your body ONLY FROM THE NECK DOWN.  Do not use on open wounds or open sores. Avoid contact with your eyes, ears, mouth and genitals (private parts). Wash genitals (private parts) with your normal soap.  6. Wash thoroughly, paying special attention to the area where your surgery will be performed.  7. Thoroughly rinse your body with warm water from the neck down.  8. DO NOT shower/wash with your normal soap after using and rinsing off the CHG Soap.  9. Pat yourself dry with a CLEAN TOWEL.   10. Wear CLEAN PAJAMAS  11. Place CLEAN SHEETS on your bed the night of your first shower and DO NOT SLEEP WITH PETS.    Day of Surgery: Do not apply any deodorants/lotions. Please wear clean clothes to the hospital/surgery center.      Please read over the following fact sheets that you were given.

## 2017-02-28 NOTE — Progress Notes (Signed)
Krystal Brooks called the office to confirm that the office is ok with the pre-op appointment being 15 days before the surgery  According to stephanie at the office it is ok

## 2017-03-01 ENCOUNTER — Encounter (HOSPITAL_COMMUNITY): Payer: Self-pay

## 2017-03-01 ENCOUNTER — Encounter (HOSPITAL_COMMUNITY)
Admission: RE | Admit: 2017-03-01 | Discharge: 2017-03-01 | Disposition: A | Discharge status: Home / Self Care | Payer: Commercial Managed Care - HMO | Source: Ambulatory Visit | Attending: General Surgery | Admitting: General Surgery

## 2017-03-01 DIAGNOSIS — Z01812 Encounter for preprocedural laboratory examination: Secondary | ICD-10-CM | POA: Insufficient documentation

## 2017-03-01 DIAGNOSIS — C50912 Malignant neoplasm of unspecified site of left female breast: Secondary | ICD-10-CM | POA: Insufficient documentation

## 2017-03-01 DIAGNOSIS — I10 Essential (primary) hypertension: Secondary | ICD-10-CM | POA: Insufficient documentation

## 2017-03-01 DIAGNOSIS — Z170 Estrogen receptor positive status [ER+]: Secondary | ICD-10-CM

## 2017-03-01 DIAGNOSIS — Z01818 Encounter for other preprocedural examination: Secondary | ICD-10-CM | POA: Diagnosis present

## 2017-03-01 HISTORY — DX: Tonic pupil, unspecified eye: H57.059

## 2017-03-01 LAB — CBC
HCT: 35.4 % — ABNORMAL LOW (ref 36.0–46.0)
Hemoglobin: 12 g/dL (ref 12.0–15.0)
MCH: 29.5 pg (ref 26.0–34.0)
MCHC: 33.9 g/dL (ref 30.0–36.0)
MCV: 87 fL (ref 78.0–100.0)
PLATELETS: 179 10*3/uL (ref 150–400)
RBC: 4.07 MIL/uL (ref 3.87–5.11)
RDW: 13.1 % (ref 11.5–15.5)
WBC: 6.9 10*3/uL (ref 4.0–10.5)

## 2017-03-01 LAB — BASIC METABOLIC PANEL
Anion gap: 10 (ref 5–15)
BUN: 7 mg/dL (ref 6–20)
CALCIUM: 9.7 mg/dL (ref 8.9–10.3)
CO2: 26 mmol/L (ref 22–32)
Chloride: 90 mmol/L — ABNORMAL LOW (ref 101–111)
Creatinine, Ser: 0.51 mg/dL (ref 0.44–1.00)
GFR calc Af Amer: >60 mL/min (ref >60)
Glucose, Bld: 95 mg/dL (ref 65–99)
POTASSIUM: 3.2 mmol/L — AB (ref 3.5–5.1)
SODIUM: 126 mmol/L — AB (ref 135–145)

## 2017-03-01 NOTE — Pre-Procedure Instructions (Signed)
Krystal Brooks  03/01/2017      [id number] - [place], [place] - [address] [place] [address] Phone: [phone] Fax: [phone]    Your procedure is scheduled on July 18, Wednesday   Report to [place] at [place].M.  Call this number if you have problems the morning of surgery:  [phone]   Remember:  Do not eat food or drink liquids after midnight. Except follow these instructions  Please complete your 8oz of Boost Breeze  that was given to you at your preadmission appointment by 0615 on the day of your surgery   Take these medicines the morning of surgery with A SIP OF WATER eye drops if needed, VENTOLIN HFA if needed and bring with you the morning of surgery  7 days prior to surgery STOP taking any Aspirin, Aleve, Naproxen, Ibuprofen, Motrin, Advil, Goody's, BC's, all herbal medications, fish oil, and all vitamins    Do not wear jewelry, make-up or nail polish.  Do not wear lotions, powders, or perfumes, or deoderant.  Do not shave 48 hours prior to surgery.    Do not bring valuables to the hospital.  [facility] is not responsible for any belongings or valuables.  Contacts, dentures or bridgework may not be worn into surgery.  Leave your suitcase in the car.  After surgery it may be brought to your room.  For patients admitted to the hospital, discharge time will be determined by your treatment team.  Patients discharged the day of surgery will not be allowed to drive home.    Special instructions:   [place] [facility]- Preparing For Surgery  Before surgery, you can play an important role. Because skin is not sterile, your skin needs to be as free of germs as possible. You can reduce the number of germs on your skin by washing with CHG (chlorahexidine gluconate) Soap before surgery.  CHG is an antiseptic cleaner which kills germs and bonds with the skin to continue killing  germs even after washing.  Please do not use if you have an allergy to CHG or antibacterial soaps. If your skin becomes reddened/irritated stop using the CHG.  Do not shave (including legs and underarms) for at least 48 hours prior to first CHG shower. It is OK to shave your face.  Please follow these instructions carefully.   1. Shower the NIGHT BEFORE SURGERY and the MORNING OF SURGERY with CHG.   2. If you chose to wash your hair, wash your hair first as usual with your normal shampoo.  3. After you shampoo, rinse your hair and body thoroughly to remove the shampoo.  4. Use CHG as you would any other liquid soap. You can apply CHG directly to the skin and wash gently with a scrungie or a clean washcloth.   5. Apply the CHG Soap to your body ONLY FROM THE NECK DOWN.  Do not use on open wounds or open sores. Avoid contact with your eyes, ears, mouth and genitals (private parts). Wash genitals (private parts) with your normal soap.  6. Wash thoroughly, paying special attention to the area where your surgery will be performed.  7. Thoroughly rinse your body with warm water from the neck down.  8. DO NOT shower/wash with your normal soap after using and rinsing off the CHG Soap.  9. Pat yourself dry with a CLEAN TOWEL.   10. Wear  CLEAN PAJAMAS   11. Place CLEAN SHEETS on your bed the night of your first shower and DO NOT SLEEP WITH PETS.    Day of Surgery: Do not apply any deodorants/lotions. Please wear clean clothes to the hospital/surgery center.      Please read over the following fact sheets that you were given.

## 2017-03-03 ENCOUNTER — Encounter (HOSPITAL_COMMUNITY): Payer: Self-pay

## 2017-03-03 NOTE — Progress Notes (Addendum)
Anesthesia Chart Review: Patient is a 61 year old female scheduled for bilateral total mastectomy with left sentinel lymph node biopsy (Dr. Excell Seltzer), bilateral immediate breast reconstruction with placement of tissue expander and Flex HD (Dr. Audelia Hives) on 03/16/17. Anesthesia type is posted for general with pec block.  History includes former smoker, bilateral breast cancer (s/p right breast lumpectomy 01/27/16 s/p radiation; confirmed on left 01/16/17), HTN, asthma, anxiety, Adie's pupil (left eye), hysterectomy. On an MRI 08/04/15 (ordered by Dr. Jenna Luo), there was evidence of atherosclerosis of the abdominal aorta with "focal bulge" infrarenal region measuring 2.3 x 2.7 cm, and Dr. Dennard Schaumann recommended smoking cessation.   For anesthesia history, she reports that she is very hard to wake up.   - PCP is listed Dr. Jenna Luo, next appointment on 03/04/17 for BP follow-up.  - HEM-ONC is with [facility]. Has had visits with Dr. Sarajane Jews Magrinat (last 01/17/17) and also Dr. Truitt Merle. RAD-ONC is Dr. Thea Silversmith.  Meds include Arimidex, Anora Ellipta, losartan-HCTZ, Singulair, Ventolin HFA.  BP (!) 169/74   Pulse 93   Temp (!) 36.4 C   Resp 18   Ht 5\' 1"  (1.549 m)   Wt 125 lb 9.6 oz (57 kg)   SpO2 98%   BMI 23.73 kg/m   EKG 03/01/17: NSR.  MRI L-spine 08/04/15: IMPRESSION: - Scoliosis lumbar spine with superimposed degenerative changes. Summary of pertinent findings includes: - L5-S1 baseline very mild narrowing left neural foramen. Within the superior aspect of the left neural foramen there is a 6 mm fluid intensity structure which may represent a small synovial cyst arising from the degenerated facet joint further crowding the course the exiting left L5 nerve root. Secondary consideration is that this represents a small perineural cyst. - L2-3 multifactorial mild to slightly moderate narrowing right lateral aspect of the thecal sac. - L3-4 multifactorial very  mild narrowing right lateral aspect of the thecal sac. - L4-5 bulge slightly greater to left approaching but not compressing the exiting left L4 nerve root. - Atherosclerotic type changes of the abdominal aorta with focal bulge infrarenal region measuring 2.3 x 2.7 cm maximal transverse dimension whereas just above this level the aorta measures 1.7 cm. - Partially visualized adrenal glands appear slightly prominent which may represent hyperplasia.  Preoperative labs noted. Na 126 (previously 130-134 since 01/2015). Chloride 90 (previously 93-96 since 01/2015). K 3.2 (primarily 3.1-3.4 since 01/2015). Cr 0.51. H/H 12.0/35.4. Glucose 95.  I notified patient of low Na, Cl, K. She does have a degree of chronic hyponatremia and hypokalemia. HCTZ may be contributing. She has also been drinking more water (instread of coffee and tea) because Plastics has asked her to avoid "sugar" (wonder if also to avoid caffeine) before surgery. Discussed that her surgery could be canceled if her Na was not > 130 or K > 3.0. She is actually scheduled to see Dr. Dennard Schaumann tomorrow and can discuss further with him. I recommended that her BMET be rechecked prior to her surgery date. She could have this done at Dr. Samella Parr office or contact PAT to reschedule a lab only visit. Her chart will be left for follow-up. I will also send staff messages to Dr. Dennard Schaumann, Dr. Excell Seltzer, and Dr. Redmond Pulling, [facility] Short Stay Center/Anesthesiology Phone [phone] 03/03/2017 4:01 PM  Addendum: Patient was seen by Dr. Dennard Schaumann this afternoon. 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 I have recommended discontinuation of losartan/hydrochlorothiazide for this reason. I believe this will improve the serum sodium level as well as the serum potassium level. I have also recommended restriction of  fluid intake to 1200 mL per day. We will reassess her serum sodium level next week on Tuesday to ensure improvement. I believe the hypokalemia is definitely due to hydrochlorothiazide. If persistent, evaluate for adrenal insufficiency. However, her blood pressure is not well controlled. Therefore I will have the patient start losartan 100 mg by mouth  daily as well as amlodipine 10 mg by mouth daily and recheck her blood pressure next week."   Chart will be left for BMET and HTN follow-up results.  George Hugh [facility] Short Stay Center/Anesthesiology Phone [phone] 03/04/2017 4:55 PM  Addendum:   BMET 03/08/17 reviewed.  Na is now 134, Cl 99. K is up to 3.8.    BP was 144/82 on 03/08/17  If no changes, I anticipate pt can proceed with surgery as scheduled.   Willeen Cass, FNP-BC [facility] Short Stay Surgical Center/Anesthesiology Phone: [phone] 03/09/2017 9:10 AM

## 2017-03-04 ENCOUNTER — Encounter: Payer: Self-pay | Admitting: Family Medicine

## 2017-03-04 ENCOUNTER — Ambulatory Visit (INDEPENDENT_AMBULATORY_CARE_PROVIDER_SITE_OTHER): Payer: 59 | Admitting: Family Medicine

## 2017-03-04 VITALS — BP 144/88 | HR 86 | Temp 98.3°F | Resp 18 | Ht 61.0 in | Wt 125.0 lb

## 2017-03-04 DIAGNOSIS — I10 Essential (primary) hypertension: Secondary | ICD-10-CM | POA: Diagnosis not present

## 2017-03-04 DIAGNOSIS — E871 Hypo-osmolality and hyponatremia: Secondary | ICD-10-CM | POA: Diagnosis not present

## 2017-03-04 DIAGNOSIS — E876 Hypokalemia: Secondary | ICD-10-CM

## 2017-03-04 MED ORDER — LOSARTAN POTASSIUM 100 MG PO TABS: 100.0000 mg | ORAL_TABLET | Freq: Every day | ORAL | 3 refills | Status: DC

## 2017-03-04 MED ORDER — AMLODIPINE BESYLATE 10 MG PO TABS: 10.0000 mg | ORAL_TABLET | Freq: Every day | ORAL | 3 refills | Status: DC

## 2017-03-04 NOTE — Progress Notes (Signed)
Subjective:    Patient ID: Krystal Brooks, female    DOB: [birth year], 61 y.o.   MRN: [id number]  HPI  Patient has breast cancer and is scheduled for surgery this month for treatment and reconstruction. Recently. Lab work obtained at the surgical center and with the anesthesiologist, she has been found to have hyponatremia. Her most recent sodium level was 126. Review of her chart over the last 2 years reveals an average serum sodium between 133 and 134 however it is recently worsened. Also her potassium is low at 3.2. Of note she is on losartan/hydrochlorothiazide. She also reports polydipsia and has been sipping on drinks constantly throughout the day. She denies any headache cough or shortness of breath. She denies any seizures or dizziness or ataxia Past Medical History:  Diagnosis Date  . Adie's pupil    LEFT EYE  . Allergy   . Anxiety   . Asthma   . Atherosclerosis of aorta (HCC)    bulge above renal arteries on mri 2016  . Cancer [facility]) 2017   right breast ca  . Complication of anesthesia    VERY HARD TO WAKE UP    . Hypertension   . Personal history of radiation therapy   . PONV (postoperative nausea and vomiting)   . Smoker    Past Surgical History:  Procedure Laterality Date  . ABDOMINAL HYSTERECTOMY    . BREAST BIOPSY Right 01/02/2016  . BREAST EXCISIONAL BIOPSY Right 1979  . BREAST LUMPECTOMY Right 01/27/2016  . BREAST LUMPECTOMY WITH RADIOACTIVE SEED AND SENTINEL LYMPH NODE BIOPSY Right 01/27/2016   Procedure: BREAST LUMPECTOMY WITH RADIOACTIVE SEED AND SENTINEL LYMPH NODE BIOPSY;  Surgeon: Excell Seltzer, MD;  Location: [place];  Service: General;  Laterality: Right;  . BREAST SURGERY  1979   cyst removed rt breast  . CATARACT EXTRACTION W/ INTRAOCULAR LENS IMPLANT     RT EYE  . CESAREAN SECTION     x2   Current Outpatient Prescriptions on File Prior to Visit  Medication Sig Dispense Refill  . acetaminophen (TYLENOL) 500 MG tablet Take 1,500  mg by mouth daily as needed for moderate pain or headache.    Jearl Klinefelter ELLIPTA 62.5-25 MCG/INH AEPB INHALE 1 PUFF INTO THE LUNGS DAILY (Patient taking differently: Inhale 1 puff into the lungs every evening. ) 60 each 10  . diphenhydrAMINE-zinc acetate (BENADRYL) cream Apply 1 application topically daily as needed for itching.    Marland Kitchen ibuprofen (ADVIL,MOTRIN) 200 MG tablet Take 600 mg by mouth daily as needed for moderate pain.    Marland Kitchen losartan-hydrochlorothiazide (HYZAAR) 50-12.5 MG tablet TAKE 2 TABLETS BY MOUTH DAILY. (Patient taking differently: Take 2 tablets by mouth every evening. ) 60 tablet 5  . montelukast (SINGULAIR) 10 MG tablet TAKE 1 TABLET BY MOUTH EVERY DAY (Patient taking differently: TAKE 1 TABLET BY MOUTH EVERY DAY AT BEDTIME) 90 tablet 3  . Multiple Vitamin (MULTIVITAMIN WITH MINERALS) TABS tablet Take 1 tablet by mouth daily.    . Naphazoline HCl (CLEAR EYES OP) Place 1 drop into the right eye daily as needed (irritation).    . VENTOLIN HFA 108 (90 Base) MCG/ACT inhaler USE 1-2 PUFFS EVERY 6 HOURS AS NEEDED FOR WHEEZING 18 Inhaler 3  . vitamin C (ASCORBIC ACID) 500 MG tablet Take 500 mg by mouth daily.     No current facility-administered medications on file prior to visit.    Allergies  Allergen Reactions  . Penicillins Swelling and Rash  Older sister died from reaction to penicillin Has patient had a PCN reaction causing immediate rash, facial/tongue/throat swelling, SOB or lightheadedness with hypotension: Yes Has patient had a PCN reaction causing severe rash involving mucus membranes or skin necrosis: No Has patient had a PCN reaction that required hospitalization: No Has patient had a PCN reaction occurring within the last 10 years: No If all of the above answers are "NO", then may proceed with Cephalosporin use.   . Sulfa Antibiotics Rash   Social History   Social History  . Marital status: Married    Spouse name: N/A  . Number of children: N/A  . Years of  education: N/A   Occupational History  . Not on file.   Social History Main Topics  . Smoking status: Former Smoker    Packs/day: 0.50    Years: 40.00    Types: Cigarettes  . Smokeless tobacco: Never Used  . Alcohol use 0.0 oz/week     Comment: social  . Drug use: No  . Sexual activity: Yes    Birth control/ protection: Surgical     Comment: hysterectomy   Other Topics Concern  . Not on file   Social History Narrative  . No narrative on file     Review of Systems  All other systems reviewed and are negative.      Objective:   Physical Exam  Cardiovascular: Normal rate, regular rhythm and normal heart sounds.   Pulmonary/Chest: Effort normal and breath sounds normal. No respiratory distress. She has no wheezes. She has no rales.  Vitals reviewed.         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 I have recommended discontinuation of losartan/hydrochlorothiazide for this reason. I believe this will improve the serum sodium level as well as the serum potassium level. I have also recommended restriction of fluid intake to 1200 mL per day. We will reassess her serum sodium level next week on Tuesday to ensure improvement. I believe the hypokalemia is definitely due to hydrochlorothiazide. If persistent, evaluate for adrenal insufficiency. However, her blood pressure is not well controlled. Therefore I will have the patient start losartan 100 mg by mouth  daily as well as amlodipine 10 mg by mouth daily and recheck her blood pressure next week.  patient is requesting medication for anxiety and sleep regarding her upcoming surgery. They recently gave her prescription for Valium 2 mg every 8 hours as needed. I recommended that she try this medication first to see if this will help with anxiety  and sleep.

## 2017-03-08 ENCOUNTER — Encounter: Payer: Self-pay | Admitting: Family Medicine

## 2017-03-08 ENCOUNTER — Ambulatory Visit (INDEPENDENT_AMBULATORY_CARE_PROVIDER_SITE_OTHER): Payer: 59 | Admitting: Family Medicine

## 2017-03-08 VITALS — BP 144/82 | HR 88 | Temp 98.3°F | Resp 14 | Ht 61.0 in | Wt 127.0 lb

## 2017-03-08 DIAGNOSIS — E876 Hypokalemia: Secondary | ICD-10-CM

## 2017-03-08 DIAGNOSIS — E871 Hypo-osmolality and hyponatremia: Secondary | ICD-10-CM

## 2017-03-08 DIAGNOSIS — C50911 Malignant neoplasm of unspecified site of right female breast: Secondary | ICD-10-CM | POA: Diagnosis not present

## 2017-03-08 DIAGNOSIS — I10 Essential (primary) hypertension: Secondary | ICD-10-CM

## 2017-03-08 DIAGNOSIS — C50912 Malignant neoplasm of unspecified site of left female breast: Secondary | ICD-10-CM | POA: Diagnosis not present

## 2017-03-08 NOTE — Progress Notes (Signed)
Subjective:    Patient ID: Krystal Brooks, female    DOB: 08-Apr-1956, 61 y.o.   MRN:  [id number]  Last OV Patient has breast cancer and is scheduled for surgery this month for treatment and reconstruction. Recently. Lab work obtained at the surgical center and with the anesthesiologist, she has been found to have hyponatremia. Her most recent sodium level was 126. Review of her chart over the last 2 years reveals an average serum sodium between 133 and 134 however it is recently worsened. Also her potassium is low at 3.2. Of note she is on losartan/hydrochlorothiazide. She also reports polydipsia and has been sipping on drinks constantly throughout the day. She denies any headache cough or shortness of breath. She denies any seizures or dizziness or ataxia.  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 I have recommended discontinuation of losartan/hydrochlorothiazide for this reason. I believe this will improve the serum sodium level as well as the serum potassium level. I have also recommended restriction of fluid intake to 1200 mL per day. We will reassess her serum sodium level next week on Tuesday to ensure improvement. I believe the hypokalemia is definitely due to hydrochlorothiazide. If persistent, evaluate for adrenal  insufficiency. However, her blood pressure is not well controlled. Therefore I will have the patient start losartan 100 mg by mouth  daily as well as amlodipine 10 mg by mouth daily and recheck her blood pressure next week.  patient is requesting medication for anxiety and sleep regarding her upcoming surgery. They recently gave her prescription for Valium 2 mg every 8 hours as needed. I recommended that she try this medication first to see if this will help with anxiety and sleep.  03/08/17 Patient is here today for follow-up. She's been eating a diet rich in potassium. She discontinued hydrochlorothiazide. She is also been trying to restrict her fluid intake to less than 1200 mL a day. Her blood pressure today is borderline controlled on the combination of amlodipine and losartan. She denies any significant side effects from amlodipine. She does not have significant leg swelling Past Medical History:  Diagnosis Date  . Adie's pupil    LEFT EYE  . Allergy   . Anxiety   . Asthma   . Atherosclerosis of aorta (HCC)    bulge above renal arteries on mri 2016  . Cancer [facility]) 2017   right breast ca  . Complication of anesthesia    VERY HARD TO WAKE UP    . Hypertension   . Personal history of radiation therapy   . PONV (postoperative nausea and vomiting)   . Smoker    Past Surgical History:  Procedure Laterality Date  . ABDOMINAL HYSTERECTOMY    . BREAST BIOPSY Right 01/02/2016  . BREAST EXCISIONAL BIOPSY Right 1979  . BREAST LUMPECTOMY Right 01/27/2016  . BREAST LUMPECTOMY WITH RADIOACTIVE SEED AND SENTINEL LYMPH NODE BIOPSY Right 01/27/2016   Procedure: BREAST LUMPECTOMY WITH RADIOACTIVE SEED AND SENTINEL LYMPH NODE BIOPSY;  Surgeon: Excell Seltzer, MD;  Location: [place];  Service: General;  Laterality: Right;  . BREAST SURGERY  1979   cyst removed rt breast  . CATARACT EXTRACTION W/ INTRAOCULAR LENS IMPLANT     RT EYE  . CESAREAN SECTION     x2   Current  Outpatient Prescriptions on File Prior to Visit  Medication Sig Dispense Refill  . acetaminophen (TYLENOL) 500 MG tablet Take 1,500 mg by mouth daily as needed for moderate pain or headache.    Marland Kitchen  amLODipine (NORVASC) 10 MG tablet Take 1 tablet (10 mg total) by mouth daily. 90 tablet 3  . ANORO ELLIPTA 62.5-25 MCG/INH AEPB INHALE 1 PUFF INTO THE LUNGS DAILY (Patient taking differently: Inhale 1 puff into the lungs every evening. ) 60 each 10  . diphenhydrAMINE-zinc acetate (BENADRYL) cream Apply 1 application topically daily as needed for itching.    Marland Kitchen ibuprofen (ADVIL,MOTRIN) 200 MG tablet Take 600 mg by mouth daily as needed for moderate pain.    Marland Kitchen losartan (COZAAR) 100 MG tablet Take 1 tablet (100 mg total) by mouth daily. 90 tablet 3  . montelukast (SINGULAIR) 10 MG tablet TAKE 1 TABLET BY MOUTH EVERY DAY (Patient taking differently: TAKE 1 TABLET BY MOUTH EVERY DAY AT BEDTIME) 90 tablet 3  . Multiple Vitamin (MULTIVITAMIN WITH MINERALS) TABS tablet Take 1 tablet by mouth daily.    . Naphazoline HCl (CLEAR EYES OP) Place 1 drop into the right eye daily as needed (irritation).    . VENTOLIN HFA 108 (90 Base) MCG/ACT inhaler USE 1-2 PUFFS EVERY 6 HOURS AS NEEDED FOR WHEEZING 18 Inhaler 3  . vitamin C (ASCORBIC ACID) 500 MG tablet Take 500 mg by mouth daily.     No current facility-administered medications on file prior to visit.    Allergies  Allergen Reactions  . Penicillins Swelling and Rash    Older sister died from reaction to penicillin Has patient had a PCN reaction causing immediate rash, facial/tongue/throat swelling, SOB or lightheadedness with hypotension: Yes Has patient had a PCN reaction causing severe rash involving mucus membranes or skin necrosis: No Has patient had a PCN reaction that required hospitalization: No Has patient had a PCN reaction occurring within the last 10 years: No If all of the above answers are "NO", then may proceed with Cephalosporin use.   . Sulfa  Antibiotics Rash   Social History   Social History  . Marital status: Married    Spouse name: N/A  . Number of children: N/A  . Years of education: N/A   Occupational History  . Not on file.   Social History Main Topics  . Smoking status: Former Smoker    Packs/day: 0.50    Years: 40.00    Types: Cigarettes  . Smokeless tobacco: Never Used  . Alcohol use 0.0 oz/week     Comment: social  . Drug use: No  . Sexual activity: Yes    Birth control/ protection: Surgical     Comment: hysterectomy   Other Topics Concern  . Not on file   Social History Narrative  . No narrative on file     Review of Systems  All other systems reviewed and are negative.      Objective:   Physical Exam  Cardiovascular: Normal rate, regular rhythm and normal heart sounds.   Pulmonary/Chest: Effort normal and breath sounds normal. No respiratory distress. She has no wheezes. She has no rales.  Vitals reviewed.         Assessment & Plan:  Hyponatremia - Plan: BASIC METABOLIC PANEL WITH GFR  Hypokalemia - Plan: BASIC METABOLIC PANEL WITH GFR  Benign essential HTN - Plan: BASIC METABOLIC PANEL WITH GFR  Blood pressures acceptable. Recheck a BMP today. If the patient's sodium and potassium have normalized, no further workup is necessary and the patient is cleared to proceed with surgery. If the sodium level remains critically low, I would next evaluate the patient for hypothyroidism as well as adrenal insufficiency.

## 2017-03-09 LAB — BASIC METABOLIC PANEL WITH GFR
BUN: 11 mg/dL (ref 7–25)
CALCIUM: 8.8 mg/dL (ref 8.6–10.4)
CHLORIDE: 99 mmol/L (ref 98–110)
CO2: 26 mmol/L (ref 20–31)
CREATININE: 0.58 mg/dL (ref 0.50–0.99)
GFR, Est African American: >89 mL/min (ref >=60)
GFR, Est Non African American: >89 mL/min (ref >=60)
Glucose, Bld: 107 mg/dL — ABNORMAL HIGH (ref 70–99)
Potassium: 3.8 mmol/L (ref 3.5–5.3)
Sodium: 134 mmol/L — ABNORMAL LOW (ref 135–146)

## 2017-03-10 ENCOUNTER — Other Ambulatory Visit (HOSPITAL_COMMUNITY): Payer: Commercial Managed Care - HMO

## 2017-03-14 ENCOUNTER — Ambulatory Visit: Payer: Self-pay | Admitting: Plastic Surgery

## 2017-03-15 NOTE — H&P (Signed)
History of Present Illness  The patient is a 61 year old female who presents with breast cancer. Patient returns to the office with unfortunately a new diagnosis of left breast cancer. She presented for her routine mammogram recently. She has had no breast symptoms, specifically lump or nipple discharge or skin changes. This was a diagnostic mammogram due to history of breast cancer. This revealed a new irregular mass within the upper inner left breast. The radiologist noted a palpable mass at the 11 o'clock position. Targeted ultrasound revealed a 1.4 cm irregular hypoechoic mass at the 11 o'clock position 5 cm from the nipple. A large core needle biopsy was recommended and performed. This has revealed invasive lobular carcinoma, ER +100% PR -0% cast 6715% and HER-2 negative.   She is just exactly 1 year following initial surgical therapy for invasive lobular carcinoma of the right breast. Final pathology here showed scattered microscopic foci of invasive lobular carcinoma grade 2 with lobular carcinoma in situ in margins were negative. 2 sentinel lymph nodes were negative. She received postoperative radiation therapy and has been on adjuvant Arimidex.   Problem List/Past Medical  STAGE I BREAST CANCER, RIGHT (C50.911)   Past Surgical History  Breast Biopsy  Right. Cesarean Section - Multiple  Hysterectomy (not due to cancer) - Partial   Diagnostic Studies History  Colonoscopy  never Mammogram  within last year Pap Smear  >5 years ago  Allergies  PenicillAMINE *ASSORTED CLASSES*  Sulfa Antibiotics  Allergies Reconciled   Medication History  Anastrozole (1MG Tablet, Oral) Active. Anoro Ellipta (62.5-25MCG/INH Aero Pow Br Act, Inhalation) Active. Losartan Potassium-HCTZ (50-12.5MG Tablet, Oral) Active. Montelukast Sodium (10MG Tablet, Oral) Active. Albuterol (90MCG/ACT Aerosol Soln, Inhalation) Active. Medications Reconciled  Social History Alcohol use   Moderate alcohol use. Caffeine use  Carbonated beverages, Coffee, Tea. No drug use  Tobacco use  Current every day smoker.  Family History  Colon Cancer  Family Members In General. Diabetes Mellitus  Father. Heart Disease  Father. Melanoma  Father.  Pregnancy / Birth History  Age at menarche  48 years. Age of menopause  65-50 Contraceptive History  Oral contraceptives. Gravida  2 Maternal age  36-25 Para  2  Other Problems  Anxiety Disorder  Asthma  Cervical Cancer  High blood pressure  Lump In Breast   Vitals   Weight: 125.8 lb Height: 61in Body Surface Area: 1.55 m Body Mass Index: 23.77 kg/m  Temp.: 98.70F  Pulse: 60 (Regular)  P.OX: 96% (Room air) BP: 130/90 (Sitting, Left Arm, Standard)       Physical Exam  The physical exam findings are as follows: Note:General: Alert, well-developed and well nourished Caucasian female, in no distress Skin: Warm and dry without rash or infection. HEENT: No palpable masses or thyromegaly. Sclera nonicteric. Pupils equal round and reactive. Lymph nodes: No cervical, supraclavicular, nodes palpable. Breasts: Mild postradiation changes on the right. No palpable masses. No skin changes. On the left there is a superficial firm approximately 1-1/2 cm mass at the 11 o'clock position 5 cm from the nipple. No other palpable abnormalities. No skin changes. No palpable axillary adenopathy bilaterally. Lungs: Breath sounds clear and equal. No wheezing or increased work of breathing. Cardiovascular: Regular rate and rhythm without murmer. Extremities: No edema or joint swelling or deformity. No chronic venous stasis changes. Neurologic: Alert and fully oriented. Gait normal. No focal weakness. Psychiatric: Normal mood and affect. Thought content appropriate with normal judgement and insight    Assessment & Plan  MALIGNANT NEOPLASM  OF LEFT BREAST, STAGE 1, ESTROGEN RECEPTOR POSITIVE (C50.912) Impression:  61 year old female with personal history of lobular carcinoma of the right breast status post lumpectomy negative sentinel lymph node biopsy, radiation and adjuvant Arimidex with diagnosis one year ago. She now presents with a new invasive lobular carcinoma of the left breast, T1b N0 M0 stage Ia. We had a long discussion including her husband regarding the nature of her new diagnosis and treatment options. We discussed that this current cancer with be successfully treated with breast conservation with lumpectomy and sentinel lymph node biopsy and radiation. Likely this cancer was present at the time of her initial diagnosis one year ago it was just not detectable. That is likely why the antiestrogen therapy did not prevented. We discussed that she certainly would be at some increased risk for subsequent breast cancer on either side but that antiestrogen therapy could reduce this risk.After consideration and consultation with plastic surgery she has elected to proceed with bilateral total mastectomy with immediate reconstruction.  Plan bilateral total mastectomy and left axillary SLN Bx with immediate reconstruction

## 2017-03-16 ENCOUNTER — Encounter (HOSPITAL_COMMUNITY)
Admission: RE | Admit: 2017-03-16 | Discharge: 2017-03-16 | Disposition: A | Discharge status: Home / Self Care | Payer: 59 | Source: Ambulatory Visit | Attending: General Surgery | Admitting: General Surgery

## 2017-03-16 ENCOUNTER — Inpatient Hospital Stay (HOSPITAL_COMMUNITY): Payer: 59 | Admitting: Vascular Surgery

## 2017-03-16 ENCOUNTER — Observation Stay (HOSPITAL_COMMUNITY)
Admission: RE | Admit: 2017-03-16 | Discharge: 2017-03-17 | Disposition: A | Discharge status: Home / Self Care | Payer: 59 | Source: Ambulatory Visit | Attending: Plastic Surgery | Admitting: Plastic Surgery

## 2017-03-16 ENCOUNTER — Inpatient Hospital Stay (HOSPITAL_COMMUNITY): Payer: 59 | Admitting: Anesthesiology

## 2017-03-16 ENCOUNTER — Encounter (HOSPITAL_COMMUNITY): Payer: Self-pay | Admitting: Surgery

## 2017-03-16 ENCOUNTER — Encounter (HOSPITAL_COMMUNITY)
Admission: RE | Disposition: A | Discharge status: Home / Self Care | Payer: Self-pay | Source: Ambulatory Visit | Attending: Plastic Surgery

## 2017-03-16 DIAGNOSIS — Z923 Personal history of irradiation: Secondary | ICD-10-CM | POA: Insufficient documentation

## 2017-03-16 DIAGNOSIS — F419 Anxiety disorder, unspecified: Secondary | ICD-10-CM | POA: Diagnosis not present

## 2017-03-16 DIAGNOSIS — Z170 Estrogen receptor positive status [ER+]: Secondary | ICD-10-CM | POA: Insufficient documentation

## 2017-03-16 DIAGNOSIS — Z853 Personal history of malignant neoplasm of breast: Secondary | ICD-10-CM | POA: Diagnosis not present

## 2017-03-16 DIAGNOSIS — N6021 Fibroadenosis of right breast: Secondary | ICD-10-CM | POA: Insufficient documentation

## 2017-03-16 DIAGNOSIS — N641 Fat necrosis of breast: Secondary | ICD-10-CM | POA: Insufficient documentation

## 2017-03-16 DIAGNOSIS — C50311 Malignant neoplasm of lower-inner quadrant of right female breast: Secondary | ICD-10-CM | POA: Diagnosis not present

## 2017-03-16 DIAGNOSIS — J45909 Unspecified asthma, uncomplicated: Secondary | ICD-10-CM | POA: Insufficient documentation

## 2017-03-16 DIAGNOSIS — Z79899 Other long term (current) drug therapy: Secondary | ICD-10-CM | POA: Diagnosis not present

## 2017-03-16 DIAGNOSIS — Z8541 Personal history of malignant neoplasm of cervix uteri: Secondary | ICD-10-CM | POA: Diagnosis not present

## 2017-03-16 DIAGNOSIS — Z421 Encounter for breast reconstruction following mastectomy: Secondary | ICD-10-CM | POA: Diagnosis not present

## 2017-03-16 DIAGNOSIS — I10 Essential (primary) hypertension: Secondary | ICD-10-CM | POA: Diagnosis not present

## 2017-03-16 DIAGNOSIS — C50912 Malignant neoplasm of unspecified site of left female breast: Secondary | ICD-10-CM

## 2017-03-16 DIAGNOSIS — Z882 Allergy status to sulfonamides status: Secondary | ICD-10-CM | POA: Diagnosis not present

## 2017-03-16 DIAGNOSIS — I739 Peripheral vascular disease, unspecified: Secondary | ICD-10-CM | POA: Insufficient documentation

## 2017-03-16 DIAGNOSIS — Z87891 Personal history of nicotine dependence: Secondary | ICD-10-CM | POA: Diagnosis not present

## 2017-03-16 DIAGNOSIS — C50911 Malignant neoplasm of unspecified site of right female breast: Secondary | ICD-10-CM | POA: Diagnosis not present

## 2017-03-16 DIAGNOSIS — N6011 Diffuse cystic mastopathy of right breast: Secondary | ICD-10-CM | POA: Diagnosis not present

## 2017-03-16 DIAGNOSIS — Z880 Allergy status to penicillin: Secondary | ICD-10-CM | POA: Diagnosis not present

## 2017-03-16 DIAGNOSIS — C50212 Malignant neoplasm of upper-inner quadrant of left female breast: Principal | ICD-10-CM | POA: Insufficient documentation

## 2017-03-16 DIAGNOSIS — C50919 Malignant neoplasm of unspecified site of unspecified female breast: Secondary | ICD-10-CM | POA: Diagnosis present

## 2017-03-16 HISTORY — PX: BREAST RECONSTRUCTION WITH PLACEMENT OF TISSUE EXPANDER AND FLEX HD (ACELLULAR HYDRATED DERMIS): SHX6295

## 2017-03-16 HISTORY — PX: MASTECTOMY W/ SENTINEL NODE BIOPSY: SHX2001

## 2017-03-16 HISTORY — PX: TOTAL MASTECTOMY: SHX6129

## 2017-03-16 SURGERY — MASTECTOMY WITH SENTINEL LYMPH NODE BIOPSY
Anesthesia: General | Site: Breast | Laterality: Bilateral

## 2017-03-16 MED ORDER — DIPHENHYDRAMINE HCL 12.5 MG/5ML PO ELIX: 12.5000 mg | ORAL_SOLUTION | Freq: Four times a day (QID) | ORAL | Status: DC | PRN

## 2017-03-16 MED ORDER — 0.9 % SODIUM CHLORIDE (POUR BTL) OPTIME
TOPICAL | Status: DC | PRN
  Administered 2017-03-16 (×3): 1000 mL

## 2017-03-16 MED ORDER — CIPROFLOXACIN IN D5W 400 MG/200ML IV SOLN
400.0000 mg | Freq: Once | INTRAVENOUS | Status: AC
  Administered 2017-03-16: 400 mg via INTRAVENOUS
  Filled 2017-03-16: qty 200

## 2017-03-16 MED ORDER — POLYETHYLENE GLYCOL 3350 17 G PO PACK: 17.0000 g | PACK | Freq: Every day | ORAL | Status: DC | PRN

## 2017-03-16 MED ORDER — CHLORHEXIDINE GLUCONATE CLOTH 2 % EX PADS: 6.0000 | MEDICATED_PAD | Freq: Once | CUTANEOUS | Status: DC

## 2017-03-16 MED ORDER — LACTATED RINGERS IV SOLN
INTRAVENOUS | Status: DC
  Administered 2017-03-16 (×2): via INTRAVENOUS

## 2017-03-16 MED ORDER — ROCURONIUM BROMIDE 50 MG/5ML IV SOLN
INTRAVENOUS | Status: AC
  Filled 2017-03-16: qty 1

## 2017-03-16 MED ORDER — PROMETHAZINE HCL 25 MG/ML IJ SOLN: 6.2500 mg | INTRAMUSCULAR | Status: DC | PRN

## 2017-03-16 MED ORDER — OXYCODONE HCL 5 MG/5ML PO SOLN: 5.0000 mg | Freq: Once | ORAL | Status: DC | PRN

## 2017-03-16 MED ORDER — PROPOFOL 10 MG/ML IV BOLUS
INTRAVENOUS | Status: AC
  Filled 2017-03-16: qty 20

## 2017-03-16 MED ORDER — FENTANYL CITRATE (PF) 100 MCG/2ML IJ SOLN
INTRAMUSCULAR | Status: DC | PRN
  Administered 2017-03-16: 100 ug via INTRAVENOUS
  Administered 2017-03-16 (×2): 50 ug via INTRAVENOUS

## 2017-03-16 MED ORDER — MONTELUKAST SODIUM 10 MG PO TABS
10.0000 mg | ORAL_TABLET | Freq: Every day | ORAL | Status: DC
  Administered 2017-03-16: 10 mg via ORAL
  Filled 2017-03-16: qty 1

## 2017-03-16 MED ORDER — LOSARTAN POTASSIUM 50 MG PO TABS
100.0000 mg | ORAL_TABLET | Freq: Every day | ORAL | Status: DC
  Administered 2017-03-16 – 2017-03-17 (×2): 100 mg via ORAL
  Filled 2017-03-16 (×2): qty 2

## 2017-03-16 MED ORDER — HYDROCODONE-ACETAMINOPHEN 5-325 MG PO TABS
1.0000 | ORAL_TABLET | ORAL | Status: DC | PRN
  Administered 2017-03-17: 2 via ORAL
  Filled 2017-03-16: qty 2

## 2017-03-16 MED ORDER — CIPROFLOXACIN IN D5W 400 MG/200ML IV SOLN
400.0000 mg | INTRAVENOUS | Status: DC
  Filled 2017-03-16: qty 200

## 2017-03-16 MED ORDER — PHENYLEPHRINE HCL 10 MG/ML IJ SOLN
INTRAVENOUS | Status: DC | PRN
  Administered 2017-03-16: 30 ug/min via INTRAVENOUS

## 2017-03-16 MED ORDER — ACETAMINOPHEN 500 MG PO TABS
1000.0000 mg | ORAL_TABLET | ORAL | Status: AC
  Administered 2017-03-16: 1000 mg via ORAL
  Filled 2017-03-16: qty 2

## 2017-03-16 MED ORDER — GABAPENTIN 300 MG PO CAPS
300.0000 mg | ORAL_CAPSULE | ORAL | Status: AC
  Administered 2017-03-16: 300 mg via ORAL
  Filled 2017-03-16: qty 1

## 2017-03-16 MED ORDER — HYDROMORPHONE HCL 1 MG/ML IJ SOLN
INTRAMUSCULAR | Status: AC
  Filled 2017-03-16: qty 0.5

## 2017-03-16 MED ORDER — UMECLIDINIUM-VILANTEROL 62.5-25 MCG/INH IN AEPB
1.0000 | INHALATION_SPRAY | Freq: Every day | RESPIRATORY_TRACT | Status: DC
  Administered 2017-03-17: 10:00:00 1 via RESPIRATORY_TRACT
  Filled 2017-03-16: qty 14

## 2017-03-16 MED ORDER — DIPHENHYDRAMINE HCL 50 MG/ML IJ SOLN: 12.5000 mg | Freq: Four times a day (QID) | INTRAMUSCULAR | Status: DC | PRN

## 2017-03-16 MED ORDER — MIDAZOLAM HCL 5 MG/5ML IJ SOLN
INTRAMUSCULAR | Status: DC | PRN
  Administered 2017-03-16: 2 mg via INTRAVENOUS

## 2017-03-16 MED ORDER — GLYCOPYRROLATE 0.2 MG/ML IJ SOLN
INTRAMUSCULAR | Status: DC | PRN
  Administered 2017-03-16: .2 mg via INTRAVENOUS

## 2017-03-16 MED ORDER — LIDOCAINE HCL (CARDIAC) 20 MG/ML IV SOLN
INTRAVENOUS | Status: AC
  Filled 2017-03-16: qty 10

## 2017-03-16 MED ORDER — SODIUM CHLORIDE 0.9 % IJ SOLN
INTRAMUSCULAR | Status: AC
  Filled 2017-03-16: qty 10

## 2017-03-16 MED ORDER — SCOPOLAMINE 1 MG/3DAYS TD PT72
MEDICATED_PATCH | TRANSDERMAL | Status: DC | PRN
  Administered 2017-03-16: 1 via TRANSDERMAL

## 2017-03-16 MED ORDER — SUCCINYLCHOLINE CHLORIDE 200 MG/10ML IV SOSY
PREFILLED_SYRINGE | INTRAVENOUS | Status: AC
  Filled 2017-03-16: qty 20

## 2017-03-16 MED ORDER — FENTANYL CITRATE (PF) 100 MCG/2ML IJ SOLN
INTRAMUSCULAR | Status: AC
  Administered 2017-03-16: 50 ug via INTRAVENOUS
  Filled 2017-03-16: qty 2

## 2017-03-16 MED ORDER — FENTANYL CITRATE (PF) 250 MCG/5ML IJ SOLN
INTRAMUSCULAR | Status: AC
  Filled 2017-03-16: qty 5

## 2017-03-16 MED ORDER — ROCURONIUM BROMIDE 100 MG/10ML IV SOLN
INTRAVENOUS | Status: DC | PRN
  Administered 2017-03-16: 50 mg via INTRAVENOUS
  Administered 2017-03-16: 20 mg via INTRAVENOUS

## 2017-03-16 MED ORDER — SUGAMMADEX SODIUM 200 MG/2ML IV SOLN: INTRAVENOUS | Status: DC | PRN

## 2017-03-16 MED ORDER — ONDANSETRON HCL 4 MG/2ML IJ SOLN
INTRAMUSCULAR | Status: DC | PRN
  Administered 2017-03-16 (×2): 4 mg via INTRAVENOUS

## 2017-03-16 MED ORDER — DEXAMETHASONE SODIUM PHOSPHATE 10 MG/ML IJ SOLN
INTRAMUSCULAR | Status: DC | PRN
  Administered 2017-03-16: 5 mg via INTRAVENOUS

## 2017-03-16 MED ORDER — PHENYLEPHRINE HCL 10 MG/ML IJ SOLN
INTRAMUSCULAR | Status: DC | PRN
  Administered 2017-03-16: 120 ug via INTRAVENOUS
  Administered 2017-03-16: 160 ug via INTRAVENOUS
  Administered 2017-03-16: 120 ug via INTRAVENOUS

## 2017-03-16 MED ORDER — DEXAMETHASONE SODIUM PHOSPHATE 10 MG/ML IJ SOLN
INTRAMUSCULAR | Status: AC
  Filled 2017-03-16: qty 2

## 2017-03-16 MED ORDER — PROPOFOL 10 MG/ML IV BOLUS
INTRAVENOUS | Status: DC | PRN
  Administered 2017-03-16: 150 mg via INTRAVENOUS

## 2017-03-16 MED ORDER — MEPERIDINE HCL 25 MG/ML IJ SOLN: 6.2500 mg | INTRAMUSCULAR | Status: DC | PRN

## 2017-03-16 MED ORDER — ALBUTEROL SULFATE (2.5 MG/3ML) 0.083% IN NEBU: 3.0000 mL | INHALATION_SOLUTION | RESPIRATORY_TRACT | Status: DC | PRN

## 2017-03-16 MED ORDER — OXYCODONE HCL 5 MG PO TABS: 5.0000 mg | ORAL_TABLET | Freq: Once | ORAL | Status: DC | PRN

## 2017-03-16 MED ORDER — SENNA 8.6 MG PO TABS
1.0000 | ORAL_TABLET | Freq: Two times a day (BID) | ORAL | Status: DC
  Administered 2017-03-16 – 2017-03-17 (×2): 8.6 mg via ORAL
  Filled 2017-03-16 (×2): qty 1

## 2017-03-16 MED ORDER — SODIUM CHLORIDE 0.9 % IV SOLN
INTRAVENOUS | Status: DC | PRN
  Administered 2017-03-16: 500 mL

## 2017-03-16 MED ORDER — HYDROMORPHONE HCL 1 MG/ML IJ SOLN
1.0000 mg | INTRAMUSCULAR | Status: DC | PRN
  Administered 2017-03-16 – 2017-03-17 (×2): 1 mg via INTRAVENOUS
  Filled 2017-03-16 (×2): qty 1

## 2017-03-16 MED ORDER — MIDAZOLAM HCL 2 MG/2ML IJ SOLN
INTRAMUSCULAR | Status: AC
  Filled 2017-03-16: qty 2

## 2017-03-16 MED ORDER — LIDOCAINE HCL (CARDIAC) 20 MG/ML IV SOLN
INTRAVENOUS | Status: DC | PRN
  Administered 2017-03-16: 60 mg via INTRAVENOUS

## 2017-03-16 MED ORDER — SUGAMMADEX SODIUM 200 MG/2ML IV SOLN
INTRAVENOUS | Status: DC | PRN
  Administered 2017-03-16: 100 mg via INTRAVENOUS

## 2017-03-16 MED ORDER — HYDROMORPHONE HCL 1 MG/ML IJ SOLN
0.2500 mg | INTRAMUSCULAR | Status: DC | PRN
  Administered 2017-03-16: 0.5 mg via INTRAVENOUS

## 2017-03-16 MED ORDER — AMLODIPINE BESYLATE 10 MG PO TABS
10.0000 mg | ORAL_TABLET | Freq: Every day | ORAL | Status: DC
  Administered 2017-03-16 – 2017-03-17 (×2): 10 mg via ORAL
  Filled 2017-03-16 (×2): qty 1

## 2017-03-16 MED ORDER — ONDANSETRON HCL 4 MG/2ML IJ SOLN
INTRAMUSCULAR | Status: AC
  Filled 2017-03-16: qty 4

## 2017-03-16 MED ORDER — ACETAMINOPHEN 500 MG PO TABS
1000.0000 mg | ORAL_TABLET | Freq: Four times a day (QID) | ORAL | Status: DC
  Administered 2017-03-16 – 2017-03-17 (×3): 1000 mg via ORAL
  Filled 2017-03-16 (×3): qty 2

## 2017-03-16 MED ORDER — DIAZEPAM 2 MG PO TABS: 2.0000 mg | ORAL_TABLET | Freq: Two times a day (BID) | ORAL | Status: DC | PRN

## 2017-03-16 MED ORDER — CIPROFLOXACIN IN D5W 400 MG/200ML IV SOLN
400.0000 mg | INTRAVENOUS | Status: AC
  Administered 2017-03-16: 400 mg via INTRAVENOUS

## 2017-03-16 MED ORDER — FENTANYL CITRATE (PF) 100 MCG/2ML IJ SOLN
50.0000 ug | Freq: Once | INTRAMUSCULAR | Status: AC
  Administered 2017-03-16: 50 ug via INTRAVENOUS

## 2017-03-16 MED ORDER — TECHNETIUM TC 99M SULFUR COLLOID FILTERED: 1.0000 | Freq: Once | INTRAVENOUS | Status: DC | PRN

## 2017-03-16 MED ORDER — ONDANSETRON HCL 4 MG/2ML IJ SOLN
INTRAMUSCULAR | Status: AC
  Filled 2017-03-16: qty 2

## 2017-03-16 MED ORDER — ONDANSETRON 4 MG PO TBDP: 4.0000 mg | ORAL_TABLET | Freq: Four times a day (QID) | ORAL | Status: DC | PRN

## 2017-03-16 MED ORDER — NAPROXEN 250 MG PO TABS: 500.0000 mg | ORAL_TABLET | Freq: Two times a day (BID) | ORAL | Status: DC | PRN

## 2017-03-16 MED ORDER — PHENYLEPHRINE 40 MCG/ML (10ML) SYRINGE FOR IV PUSH (FOR BLOOD PRESSURE SUPPORT)
PREFILLED_SYRINGE | INTRAVENOUS | Status: AC
  Filled 2017-03-16: qty 10

## 2017-03-16 MED ORDER — ONDANSETRON HCL 4 MG/2ML IJ SOLN: 4.0000 mg | Freq: Four times a day (QID) | INTRAMUSCULAR | Status: DC | PRN

## 2017-03-16 MED ORDER — NEOSTIGMINE METHYLSULFATE 5 MG/5ML IV SOSY
PREFILLED_SYRINGE | INTRAVENOUS | Status: AC
  Filled 2017-03-16: qty 5

## 2017-03-16 MED ORDER — METHYLENE BLUE 0.5 % INJ SOLN
INTRAVENOUS | Status: AC
  Filled 2017-03-16: qty 10

## 2017-03-16 MED ORDER — SUGAMMADEX SODIUM 200 MG/2ML IV SOLN
INTRAVENOUS | Status: AC
  Filled 2017-03-16: qty 2

## 2017-03-16 SURGICAL SUPPLY — 78 items
ADH SKN CLS APL DERMABOND .7 (GAUZE/BANDAGES/DRESSINGS) ×1
BAG DECANTER FOR FLEXI CONT (MISCELLANEOUS) ×2 IMPLANT
BINDER BREAST LRG (GAUZE/BANDAGES/DRESSINGS) ×1 IMPLANT
BINDER BREAST XLRG (GAUZE/BANDAGES/DRESSINGS) IMPLANT
BIOPATCH RED 1 DISK 7.0 (GAUZE/BANDAGES/DRESSINGS) ×4 IMPLANT
CANISTER SUCT 3000ML PPV (MISCELLANEOUS) ×5 IMPLANT
CHLORAPREP W/TINT 26ML (MISCELLANEOUS) ×4 IMPLANT
CLIP VESOCCLUDE MED 6/CT (CLIP) ×1 IMPLANT
CONT SPEC 4OZ CLIKSEAL STRL BL (MISCELLANEOUS) ×1 IMPLANT
COVER PROBE W GEL 5X96 (DRAPES) ×2 IMPLANT
COVER SURGICAL LIGHT HANDLE (MISCELLANEOUS) ×5 IMPLANT
DERMABOND ADVANCED (GAUZE/BANDAGES/DRESSINGS) ×1
DERMABOND ADVANCED .7 DNX12 (GAUZE/BANDAGES/DRESSINGS) ×2 IMPLANT
DEVICE DISSECT PLASMABLAD 3.0S (MISCELLANEOUS) ×1 IMPLANT
DRAIN CHANNEL 19F RND (DRAIN) ×2 IMPLANT
DRAPE CHEST BREAST 15X10 FENES (DRAPES) ×2 IMPLANT
DRAPE HALF SHEET 40X57 (DRAPES) ×2 IMPLANT
DRAPE ORTHO SPLIT 77X108 STRL (DRAPES) ×8
DRAPE SURG 17X23 STRL (DRAPES) ×16 IMPLANT
DRAPE SURG ORHT 6 SPLT 77X108 (DRAPES) ×2 IMPLANT
DRAPE WARM FLUID 44X44 (DRAPE) ×2 IMPLANT
ELECT BLADE 4.0 EZ CLEAN MEGAD (MISCELLANEOUS) ×2
ELECT CAUTERY BLADE 6.4 (BLADE) ×3 IMPLANT
ELECT REM PT RETURN 9FT ADLT (ELECTROSURGICAL) ×6
ELECTRODE BLDE 4.0 EZ CLN MEGD (MISCELLANEOUS) IMPLANT
ELECTRODE REM PT RTRN 9FT ADLT (ELECTROSURGICAL) ×3 IMPLANT
EVACUATOR SILICONE 100CC (DRAIN) ×4 IMPLANT
GAUZE SPONGE 4X4 12PLY STRL (GAUZE/BANDAGES/DRESSINGS) ×2 IMPLANT
GLOVE BIO SURGEON STRL SZ 6.5 (GLOVE) ×6 IMPLANT
GLOVE BIOGEL PI IND STRL 6.5 (GLOVE) IMPLANT
GLOVE BIOGEL PI IND STRL 8 (GLOVE) ×1 IMPLANT
GLOVE BIOGEL PI INDICATOR 6.5 (GLOVE) ×2
GLOVE BIOGEL PI INDICATOR 8 (GLOVE) ×2
GLOVE ECLIPSE 7.5 STRL STRAW (GLOVE) ×3 IMPLANT
GLOVE INDICATOR 7.0 STRL GRN (GLOVE) ×1 IMPLANT
GLOVE SS BIOGEL STRL SZ 7.5 (GLOVE) IMPLANT
GLOVE SUPERSENSE BIOGEL SZ 7.5 (GLOVE) ×1
GLOVE SURG SS PI 6.5 STRL IVOR (GLOVE) ×1 IMPLANT
GOWN STRL REUS W/ TWL LRG LVL3 (GOWN DISPOSABLE) ×3 IMPLANT
GOWN STRL REUS W/ TWL XL LVL3 (GOWN DISPOSABLE) ×1 IMPLANT
GOWN STRL REUS W/TWL LRG LVL3 (GOWN DISPOSABLE) ×10
GOWN STRL REUS W/TWL XL LVL3 (GOWN DISPOSABLE) ×2
GRAFT FLEX HD 4X16 THICK (Tissue Mesh) ×2 IMPLANT
IMPL BREAST 300CC (Breast) IMPLANT
IMPLANT BREAST 300CC (Breast) ×4 IMPLANT
KIT BASIN OR (CUSTOM PROCEDURE TRAY) ×4 IMPLANT
KIT ROOM TURNOVER OR (KITS) ×4 IMPLANT
NDL 18GX1X1/2 (RX/OR ONLY) (NEEDLE) ×1 IMPLANT
NDL BLUNT 16X1.5 OR ONLY (NEEDLE) IMPLANT
NDL HYPO 25GX1X1/2 BEV (NEEDLE) ×1 IMPLANT
NEEDLE 18GX1X1/2 (RX/OR ONLY) (NEEDLE) ×2 IMPLANT
NEEDLE BLUNT 16X1.5 OR ONLY (NEEDLE) IMPLANT
NEEDLE HYPO 25GX1X1/2 BEV (NEEDLE) ×2 IMPLANT
NS IRRIG 1000ML POUR BTL (IV SOLUTION) ×6 IMPLANT
PACK GENERAL/GYN (CUSTOM PROCEDURE TRAY) ×4 IMPLANT
PAD ABD 8X10 STRL (GAUZE/BANDAGES/DRESSINGS) ×3 IMPLANT
PAD ARMBOARD 7.5X6 YLW CONV (MISCELLANEOUS) ×4 IMPLANT
PIN SAFETY STERILE (MISCELLANEOUS) ×1 IMPLANT
PLASMABLADE 3.0S (MISCELLANEOUS) ×2
SET ASEPTIC TRANSFER (MISCELLANEOUS) ×1 IMPLANT
SPECIMEN JAR X LARGE (MISCELLANEOUS) ×2 IMPLANT
SPONGE LAP 18X18 X RAY DECT (DISPOSABLE) ×4 IMPLANT
STAPLER VISISTAT 35W (STAPLE) ×1 IMPLANT
SUT ETHILON 2 0 FS 18 (SUTURE) ×3 IMPLANT
SUT MNCRL AB 4-0 PS2 18 (SUTURE) ×5 IMPLANT
SUT MON AB 3-0 SH 27 (SUTURE) ×4
SUT MON AB 3-0 SH27 (SUTURE) ×2 IMPLANT
SUT MON AB 5-0 PS2 18 (SUTURE) ×3 IMPLANT
SUT PDS AB 2-0 CT1 27 (SUTURE) ×12 IMPLANT
SUT SILK 4 0 PS 2 (SUTURE) ×1 IMPLANT
SUT VIC AB 3-0 54X BRD REEL (SUTURE) ×1 IMPLANT
SUT VIC AB 3-0 BRD 54 (SUTURE)
SUT VIC AB 3-0 SH 18 (SUTURE) ×2 IMPLANT
SYR CONTROL 10ML LL (SYRINGE) ×2 IMPLANT
TOWEL OR 17X24 6PK STRL BLUE (TOWEL DISPOSABLE) ×2 IMPLANT
TOWEL OR 17X26 10 PK STRL BLUE (TOWEL DISPOSABLE) ×4 IMPLANT
TRAY FOLEY W/METER SILVER 14FR (SET/KITS/TRAYS/PACK) ×1 IMPLANT
TUBE CONNECTING 12X1/4 (SUCTIONS) ×2 IMPLANT

## 2017-03-16 NOTE — Anesthesia Procedure Notes (Signed)
Procedure Name: Intubation Date/Time: 03/16/2017 11:23 AM Performed by: Salli Quarry Tawna Alwin Pre-anesthesia Checklist: Patient identified, Emergency Drugs available, Suction available and Patient being monitored Patient Re-evaluated:Patient Re-evaluated prior to induction Oxygen Delivery Method: Circle System Utilized Preoxygenation: Pre-oxygenation with 100% oxygen Induction Type: IV induction Ventilation: Mask ventilation without difficulty Laryngoscope Size: Mac and 4 Grade View: Grade I Tube type: Oral Tube size: 7.0 mm Number of attempts: 1 Airway Equipment and Method: Stylet Placement Confirmation: ETT inserted through vocal cords under direct vision,  positive ETCO2 and breath sounds checked- equal and bilateral Secured at: 22 cm Tube secured with: Tape Dental Injury: Teeth and Oropharynx as per pre-operative assessment

## 2017-03-16 NOTE — Anesthesia Preprocedure Evaluation (Signed)
Anesthesia Evaluation  Patient identified by MRN, date of birth, ID band Patient awake    Reviewed: Allergy & Precautions, NPO status , Patient's Chart, lab work & pertinent test results  History of Anesthesia Complications (+) PONV  Airway Mallampati: II  TM Distance: >3 FB Neck ROM: Full    Dental no notable dental hx.    Pulmonary asthma , Current Smoker, former smoker,    Pulmonary exam normal breath sounds clear to auscultation       Cardiovascular hypertension, Pt. on medications + Peripheral Vascular Disease  Normal cardiovascular exam Rhythm:Regular Rate:Normal     Neuro/Psych Anxiety negative neurological ROS     GI/Hepatic negative GI ROS, Neg liver ROS,   Endo/Other  negative endocrine ROS  Renal/GU negative Renal ROS  negative genitourinary   Musculoskeletal negative musculoskeletal ROS (+)   Abdominal   Peds negative pediatric ROS (+)  Hematology negative hematology ROS (+)   Anesthesia Other Findings Breast Cancer  Reproductive/Obstetrics negative OB ROS                             Anesthesia Physical  Anesthesia Plan  ASA: III  Anesthesia Plan: General   Post-op Pain Management:    Induction: Intravenous  PONV Risk Score and Plan: 4 or greater and Ondansetron, Dexamethasone, Propofol, Midazolam and Scopolamine patch - Pre-op  Airway Management Planned: Oral ETT  Additional Equipment:   Intra-op Plan:   Post-operative Plan: Extubation in OR  Informed Consent: I have reviewed the patients History and Physical, chart, labs and discussed the procedure including the risks, benefits and alternatives for the proposed anesthesia with the patient or authorized representative who has indicated his/her understanding and acceptance.   Dental advisory given  Plan Discussed with: CRNA and Surgeon  Anesthesia Plan Comments:         Anesthesia Quick  Evaluation

## 2017-03-16 NOTE — Interval H&P Note (Signed)
History and Physical Interval Note:  03/16/2017 9:57 AM  Krystal Brooks  has presented today for surgery, with the diagnosis of LEFT BREAST CANCER  The various methods of treatment have been discussed with the patient and family. After consideration of risks, benefits and other options for treatment, the patient has consented to  Procedure(s): BILATERAL TOTAL MASTECTOMY WITH LEFT SENTINEL LYMPH NODE BIOPSY (Bilateral) BILATERAL IMMEDIATE BREAST RECONSTRUCTION WITH PLACEMENT OF TISSUE EXPANDER AND FLEX HD (ACELLULAR HYDRATED DERMIS) (Bilateral) as a surgical intervention .  The patient's history has been reviewed, patient examined, no change in status, stable for surgery.  I have reviewed the patient's chart and labs.  Questions were answered to the patient's satisfaction.     [name]

## 2017-03-16 NOTE — Anesthesia Postprocedure Evaluation (Signed)
Anesthesia Post Note  Patient: Krystal Brooks  Procedure(s) Performed: Procedure(s) (LRB): BILATERAL TOTAL MASTECTOMY WITH LEFT SENTINEL LYMPH NODE BIOPSY (Bilateral) BILATERAL IMMEDIATE BREAST RECONSTRUCTION WITH PLACEMENT OF TISSUE EXPANDER AND FLEX HD (ACELLULAR HYDRATED DERMIS) (Bilateral)     Patient location during evaluation: PACU Anesthesia Type: General Level of consciousness: awake and alert Pain management: pain level controlled Vital Signs Assessment: post-procedure vital signs reviewed and stable Respiratory status: spontaneous breathing, nonlabored ventilation, respiratory function stable and patient connected to nasal cannula oxygen Cardiovascular status: blood pressure returned to baseline and stable Postop Assessment: no signs of nausea or vomiting Anesthetic complications: no    Last Vitals:  Vitals:   03/16/17 1515 03/16/17 1530  BP: 126/66 (!) 107/49  Pulse: 98 90  Resp: 18 15  Temp: 36.8 C     Last Pain:  Vitals:   03/16/17 0824  TempSrc: Oral                 Skip Litke S

## 2017-03-16 NOTE — Progress Notes (Signed)
Patient arrived 6n10, drowsy but arousalble and oriented x3. VSS, IV fluids, pain moderate will medicate per orders. Breast binder on, dressings clean dry and intact, 2 JP drains noted with no drainage, family present, will continue to monitor.

## 2017-03-16 NOTE — Op Note (Signed)
Op report    DATE OF OPERATION:  03/16/2017  LOCATION: [facility] inpatient  SURGICAL DIVISION: Plastic Surgery  PREOPERATIVE DIAGNOSES:  1. Left Breast cancer.   2. History of right breast cancer.  POSTOPERATIVE DIAGNOSES:  1. Left Breast cancer.  2. History of right breast cancer.  PROCEDURE:  1. Bilateral immediate breast reconstruction with placement of Acellular Dermal Matrix and tissue expanders.  SURGEON: Lucindia Lemley Sanger Jahne Krukowski, DO  ANESTHESIA:  General.   COMPLICATIONS: None.   IMPLANTS: Left - Mentor Smooth 300 cc. Ref # [id number].  Serial Number [id number] Right - Mentor Smooth 300 cc. Ref # [id number].  Serial Number [id number] Acellular Dermal Matrix 4 x 16 cm two  INDICATIONS FOR PROCEDURE:  The patient, Krystal Brooks, is a 61 y.o. female born on Oct 06, 1955, is here for  immediate first stage breast reconstruction with placement of bilateral tissue expander and Acellular dermal matrix. MRN: [id number]  CONSENT:  Informed consent was obtained directly from the patient. Risks, benefits and alternatives were fully discussed. Specific risks including but not limited to bleeding, infection, hematoma, seroma, scarring, pain, implant infection, implant extrusion, capsular contracture, asymmetry, wound healing problems, and need for further surgery were all discussed. The patient did have an ample opportunity to have her questions answered to her satisfaction.   DESCRIPTION OF PROCEDURE:  The patient was taken to the operating room by the general surgery team. SCDs were placed and IV antibiotics were given. The patient's chest was prepped and draped in a sterile fashion. A time out was performed and the implants to be used were identified.  Bilateral mastectomies were performed.  Once the general surgery team had completed their portion of the case the patient was rendered to the plastic and reconstructive surgery team.  Right: The pectoralis major  muscle was lifted from the chest wall with release of the lateral edge and lateral inframammary fold.  The pocket was irrigated with antibiotic solution and hemostasis was achieved with electrocautery.  The ADM was then prepared according to the manufacture guidelines and slits placed to help with postoperative fluid management.  The ADM was then sutured to the inferior and lateral edge of the inframammary fold with 2-0 PDS starting with an interrupted stitch and then a running stitch.  The lateral portion was sutured to with interrupted sutures after the expander was placed.  The expander was prepared according to the manufacture guidelines, the air evacuated and then it was placed under the ADM and pectoralis major muscle.  The inferior and lateral tabs were used to secure the expander to the chest wall with 2-0 PDS.  The drain was placed at the inframammary fold over the ADM and secured to the skin with 3-0 Silk.    Left: The pectoralis major muscle was lifted from the chest wall with release of the lateral edge and lateral inframammary fold.  The pocket was irrigated with antibiotic solution and hemostasis was achieved with electrocautery.  The ADM was then prepared according to the manufacture guidelines and slits placed to help with postoperative fluid management.  The ADM was then sutured to the inferior and lateral edge of the inframammary fold with 2-0 PDS starting with an interrupted stitch and then a running stitch.  The lateral portion was sutured to with interrupted sutures after the expander was placed.  The expander was prepared according to the manufacture guidelines, the air evacuated and then it was placed under the ADM and pectoralis major muscle.  The inferior and  lateral tabs were used to secure the expander to the chest wall with 2-0 PDS.  The drain was placed at the inframammary fold over the ADM and secured to the skin with 3-0 Silk.    The deep layers were closed with 3-0 Monocryl  followed by 4-0 Monocryl.  The skin was closed with 5-0 Monocryl and then dermabond was applied.  The ABDs and breast binder were placed.  The patient tolerated the procedure well and there were no complications.  The patient was allowed to wake from anesthesia and taken to the recovery room in satisfactory condition.

## 2017-03-16 NOTE — Transfer of Care (Signed)
Immediate Anesthesia Transfer of Care Note  Patient: Krystal Brooks  Procedure(s) Performed: Procedure(s): BILATERAL TOTAL MASTECTOMY WITH LEFT SENTINEL LYMPH NODE BIOPSY (Bilateral) BILATERAL IMMEDIATE BREAST RECONSTRUCTION WITH PLACEMENT OF TISSUE EXPANDER AND FLEX HD (ACELLULAR HYDRATED DERMIS) (Bilateral)  Patient Location: PACU  Anesthesia Type:General  Level of Consciousness: awake, alert , oriented and patient cooperative  Airway & Oxygen Therapy: Patient Spontanous Breathing  Post-op Assessment: Report given to RN and Post -op Vital signs reviewed and stable  Post vital signs: Reviewed and stable  Last Vitals:  Vitals:   03/16/17 0824  BP: 139/72  Pulse: 95  Resp: 16  Temp: 36.4 C    Last Pain:  Vitals:   03/16/17 0824  TempSrc: Oral      Patients Stated Pain Goal: 3 (56/31/49 7026)  Complications: No apparent anesthesia complications

## 2017-03-16 NOTE — Op Note (Signed)
Preoperative Diagnosis: LEFT BREAST CANCER  Postoprative Diagnosis: LEFT BREAST CANCER  Procedure: Procedure(s): BILATERAL TOTAL MASTECTOMY WITH LEFT SENTINEL LYMPH NODE BIOPSY    Surgeon: Excell Seltzer T   Assistants: Sharyn Dross RNFA  Anesthesia:  General endotracheal anesthesia  Indications: Patient is a 61 year old female with a personal history of lobular cancer of the right breast status post lumpectomy, negative sentinel lymph node biopsy and radiation one year ago who presents with a recent diagnosis of stage I invasive lobular carcinoma of the upper inner left breast. After extensive discussion regarding options for surgical treatment in light of her recent similar diagnosis she elected to proceed with bilateral total mastectomy with left axillary sentinel lymph node biopsy. We've previously discussed the nature indication of the surgery and risks of bleeding, infection, anesthetic complications, wound healing problems and slight risk of lymphedema. She has elected to proceed with immediate reconstruction.    Procedure Detail:  In the holding area the patient underwent injection of 1 mCi of technetium sulfur colloid intradermally around the left nipple. She was brought to the operating room, placed in the supine position on the operating table, and general endotracheal anesthesia induced. She received preoperative IV antibiotics. PAS were in place. The neoprobe was used to examine the left axilla and there were obvious high counts present. Following this the entire anterior chest and upper arms and axillae were widely sterilely prepped and draped. Patient timeout was performed and correct procedures verified. The right mastectomy was approached initially. A skin sparing transverse elliptical incision encompassing the nipple areolar complex was used and dissection carried down into the subcutaneous tissue using the plasma blade. Skin and subcutaneous flaps were then raised superiorly  up toward the clavicle, medially to the edge of the sternum, inferiorly to the inframammary crease and laterally out to the anterior border of latissimus dorsi which was identified. Dissection was deepened down to the chest wall in all directions. There was noted to be some thickening of the skin and subcutaneous tissue secondary to radiation but the dissection went smoothly. Following this the breast was reflected up off the chest wall working medial to lateral with the plasma blade. The specimen was reflected off the lateral edge of the pectoralis and away from the serratus and dissected off the inferior aspect of the latissimus. The clavipectoral fascia was incised and the pectoralis minor identified. The dissection progressed up to the low axilla and I did identify some slight scarring from the previous sentinel lymph node biopsy. At this point I came across the axilla with the plasma blade and the specimen was removed. This was oriented and sent for permanent pathology.  Attention was then turned to the left mastectomy. An identical incision was made and skin flaps were developed in identical fashion. The tumor was palpable in the upper inner quadrant but well contained within normal tissue. The flaps developed much more easily on this side. An identical dissection was performed. As the dissection was carried up to the axilla the clavipectoral fascia was incised and the neoprobe was used to localize a soft slightly enlarged node with very high counts. This was excised and ex vivo had counts of almost 2000. At this point careful examination with the neoprobe in the axilla failed to produce any significant counts with a sporadic single digits. There was no palpable adenopathy. I then came across the low axilla with clamps removing the specimen and this was tied with 3-0 Vicryl. The specimen was oriented and sent for permanent pathology. Both  wounds were irrigated and inspected for hemostasis. There was no  bleeding or other problems. Dr. Marla Roe then proceeded with reconstruction as planned.    Findings: As above  Estimated Blood Loss:  less than 50 mL         Drains: Per Dr. Marla Roe  Blood Given: none          Specimens: #1 right total mastectomy #2 left total mastectomy #3 hot left axillary sentinel lymph node        Complications:  * No complications entered in OR log *         Disposition: Dr. Marla Roe proceeded with planned immediate reconstruction.         Condition: stable

## 2017-03-17 ENCOUNTER — Encounter (HOSPITAL_COMMUNITY): Payer: Self-pay | Admitting: General Practice

## 2017-03-17 DIAGNOSIS — C50212 Malignant neoplasm of upper-inner quadrant of left female breast: Secondary | ICD-10-CM | POA: Diagnosis not present

## 2017-03-17 LAB — CBC
HCT: 34.3 % — ABNORMAL LOW (ref 36.0–46.0)
Hemoglobin: 11.3 g/dL — ABNORMAL LOW (ref 12.0–15.0)
MCH: 29.5 pg (ref 26.0–34.0)
MCHC: 32.9 g/dL (ref 30.0–36.0)
MCV: 89.6 fL (ref 78.0–100.0)
PLATELETS: 209 10*3/uL (ref 150–400)
RBC: 3.83 MIL/uL — ABNORMAL LOW (ref 3.87–5.11)
RDW: 13.5 % (ref 11.5–15.5)
WBC: 14 10*3/uL — ABNORMAL HIGH (ref 4.0–10.5)

## 2017-03-17 LAB — BASIC METABOLIC PANEL
Anion gap: 8 (ref 5–15)
BUN: 14 mg/dL (ref 6–20)
CALCIUM: 8.8 mg/dL — AB (ref 8.9–10.3)
CO2: 24 mmol/L (ref 22–32)
CREATININE: 0.68 mg/dL (ref 0.44–1.00)
Chloride: 102 mmol/L (ref 101–111)
GFR calc non Af Amer: >60 mL/min (ref >60)
GLUCOSE: 129 mg/dL — AB (ref 65–99)
Potassium: 4.7 mmol/L (ref 3.5–5.1)
Sodium: 134 mmol/L — ABNORMAL LOW (ref 135–145)

## 2017-03-17 MED ORDER — ALUM & MAG HYDROXIDE-SIMETH 200-200-20 MG/5ML PO SUSP
30.0000 mL | Freq: Once | ORAL | Status: DC
  Filled 2017-03-17: qty 30

## 2017-03-17 NOTE — Progress Notes (Signed)
Patient ID: Krystal Brooks, female   DOB: [birth year], 61 y.o.   MRN: [id number] 1 Day Post-Op   Subjective: Some expected pain, not severe. No other complaints.  Objective: Vital signs in last 24 hours: Temp:  [97.6 F (36.4 C)-98.2 F (36.8 C)] 97.9 F (36.6 C) (07/19 0645) Pulse Rate:  [75-98] 75 (07/19 0645) Resp:  [12-18] 18 (07/19 0645) BP: (105-146)/(45-72) 122/45 (07/19 0645) SpO2:  [92 %-99 %] 94 % (07/19 0645) Weight:  [57.6 kg (127 lb)] 57.6 kg (127 lb) (07/18 0824)    Intake/Output from previous day: 07/18 0701 - 07/19 0700 In: 2520 [P.O.:720; I.V.:1800] Out: 1300 [Urine:1015; Drains:155; Blood:130] Intake/Output this shift: Total I/O In: 720 [P.O.:720] Out: 955 [Urine:800; Drains:155]  General appearance: alert, cooperative and no distress Incision/Wound: Skin flaps appear healthy. Appropriate JP drainage. No evidence of bleeding.  Lab Results:   Recent Labs  03/17/17 0314  WBC 14.0*  HGB 11.3*  HCT 34.3*  PLT 209   BMET  Recent Labs  03/17/17 0314  NA 134*  K 4.7  CL 102  CO2 24  GLUCOSE 129*  BUN 14  CREATININE 0.68  CALCIUM 8.8*     Studies/Results: No results found.  Anti-infectives: Anti-infectives    Start     Dose/Rate Route Frequency Ordered Stop   03/16/17 2230  ciprofloxacin (CIPRO) IVPB 400 mg     400 mg 200 mL/hr over 60 Minutes Intravenous  Once 03/16/17 1656 03/16/17 2342   03/16/17 1214  polymyxin B 500,000 Units, bacitracin 50,000 Units in sodium chloride 0.9 % 500 mL irrigation  Status:  Discontinued       As needed 03/16/17 1215 03/16/17 1514   03/16/17 0817  ciprofloxacin (CIPRO) IVPB 400 mg     400 mg 200 mL/hr over 60 Minutes Intravenous On call to O.R. 03/16/17 0817 03/16/17 1130   03/16/17 0817  ciprofloxacin (CIPRO) IVPB 400 mg  Status:  Discontinued     400 mg 200 mL/hr over 60 Minutes Intravenous On call to O.R. 03/16/17 0817 03/16/17 1638      Assessment/Plan: s/p Procedure(s): BILATERAL TOTAL  MASTECTOMY WITH LEFT SENTINEL LYMPH NODE BIOPSY BILATERAL IMMEDIATE BREAST RECONSTRUCTION WITH PLACEMENT OF TISSUE EXPANDER AND FLEX HD (ACELLULAR HYDRATED DERMIS) Doing well postoperatively. Likely discharge today, per Dr. Marla Roe. I will follow-up in 3-4 weeks.   LOS: 1 day    Ilda Laskin T 03/17/2017

## 2017-03-18 LAB — HIV ANTIBODY (ROUTINE TESTING W REFLEX): HIV Screen 4th Generation wRfx: NONREACTIVE

## 2017-03-18 NOTE — Discharge Summary (Signed)
Physician Discharge Summary  Patient ID: Krystal Brooks MRN: [id number] DOB/AGE: 1956/04/16 61 y.o.  Admit date: 03/16/2017 Discharge date: 03/18/2017  Admission Diagnoses:  Discharge Diagnoses:  Active Problems:   Breast cancer [facility])   Discharged Condition: good  Hospital Course: The patient was taken to the Operating room and underwent a bilateral mastectomy procedure.  She has immediate reconstruction with expanders and Flex HD placed.  We were able to fill the expander with 200 cc of injectable saline.  She was monitored on [place] unit postoperatively.  She has done very well.  She is eating and walking without difficulty.  Her pain is controlled and she is requesting to go home.  Consults: None  Significant Diagnostic Studies  Treatments: surgery: Bilateral mastectomies with reconstruction  Discharge Exam: Blood pressure (!) 129/57, pulse 90, temperature 97.8 F (36.6 C), temperature source Oral, resp. rate 16, weight 57.6 kg (127 lb), SpO2 93 %. General appearance: alert, cooperative, appears stated age and no distress Head: Normocephalic, without obvious abnormality, atraumatic Skin: Skin color, texture, turgor normal. No rashes or lesions Incision/Wound: intact and drains in place  Disposition: 01-Home or Self Care  Discharge Instructions    Call MD for:  difficulty breathing, headache or visual disturbances    Complete by:  As directed    Call MD for:  hives    Complete by:  As directed    Call MD for:  persistant dizziness or light-headedness    Complete by:  As directed    Call MD for:  persistant nausea and vomiting    Complete by:  As directed    Call MD for:  redness, tenderness, or signs of infection (pain, swelling, redness, odor or green/yellow discharge around incision site)    Complete by:  As directed    Call MD for:  severe uncontrolled pain    Complete by:  As directed    Call MD for:  temperature >100.4    Complete by:  As directed    Diet general     Complete by:  As directed    Discharge wound care:    Complete by:  As directed    Drain care   Driving Restrictions    Complete by:  As directed    No driving while talking pain meds   Increase activity slowly    Complete by:  As directed    Lifting restrictions    Complete by:  As directed    No heavy lifting     Allergies as of 03/17/2017      Reactions   Penicillins Swelling, Rash   Older sister died from reaction to penicillin Has patient had a PCN reaction causing immediate rash, facial/tongue/throat swelling, SOB or lightheadedness with hypotension: Yes Has patient had a PCN reaction causing severe rash involving mucus membranes or skin necrosis: No Has patient had a PCN reaction that required hospitalization: No Has patient had a PCN reaction occurring within the last 10 years: No If all of the above answers are "NO", then may proceed with Cephalosporin use.   Sulfa Antibiotics Rash      Medication List    TAKE these medications   acetaminophen 500 MG tablet Commonly known as:  TYLENOL Take 1,500 mg by mouth daily as needed for moderate pain or headache.   amLODipine 10 MG tablet Commonly known as:  NORVASC Take 1 tablet (10 mg total) by mouth daily.   ANORO ELLIPTA 62.5-25 MCG/INH Aepb Generic drug:  umeclidinium-vilanterol INHALE  1 PUFF INTO THE LUNGS DAILY What changed:  when to take this   CLEAR EYES OP Place 1 drop into the right eye daily as needed (irritation).   diphenhydrAMINE-zinc acetate cream Commonly known as:  BENADRYL Apply 1 application topically daily as needed for itching.   ibuprofen 200 MG tablet Commonly known as:  ADVIL,MOTRIN Take 600 mg by mouth daily as needed for moderate pain.   losartan 100 MG tablet Commonly known as:  COZAAR Take 1 tablet (100 mg total) by mouth daily.   montelukast 10 MG tablet Commonly known as:  SINGULAIR TAKE 1 TABLET BY MOUTH EVERY DAY What changed:  See the new instructions.   multivitamin  with minerals Tabs tablet Take 1 tablet by mouth daily.   VENTOLIN HFA 108 (90 Base) MCG/ACT inhaler Generic drug:  albuterol USE 1-2 PUFFS EVERY 6 HOURS AS NEEDED FOR WHEEZING   vitamin C 500 MG tablet Commonly known as:  ASCORBIC ACID Take 500 mg by mouth daily.      Follow-up Information    Liyla Radliff, [name], DO Follow up in 1 week(s).   Specialty:  Plastic Surgery Contact information: [place] [address] [phone]        Excell Seltzer, MD Follow up in 1 month(s).   Specialty:  General Surgery Contact information: [place] [address] [phone]           Signed: Wallace Going 03/18/2017, 7:51 AM

## 2017-03-22 DIAGNOSIS — Z9013 Acquired absence of bilateral breasts and nipples: Secondary | ICD-10-CM | POA: Insufficient documentation

## 2017-03-23 ENCOUNTER — Ambulatory Visit (HOSPITAL_BASED_OUTPATIENT_CLINIC_OR_DEPARTMENT_OTHER): Payer: 59 | Admitting: Oncology

## 2017-03-23 VITALS — BP 153/73 | HR 101 | Temp 98.5°F | Resp 20 | Ht 61.0 in | Wt 124.3 lb

## 2017-03-23 DIAGNOSIS — C50212 Malignant neoplasm of upper-inner quadrant of left female breast: Secondary | ICD-10-CM

## 2017-03-23 DIAGNOSIS — Z170 Estrogen receptor positive status [ER+]: Secondary | ICD-10-CM

## 2017-03-23 DIAGNOSIS — C50311 Malignant neoplasm of lower-inner quadrant of right female breast: Secondary | ICD-10-CM | POA: Diagnosis not present

## 2017-03-23 MED ORDER — TAMOXIFEN CITRATE 20 MG PO TABS: 20.0000 mg | ORAL_TABLET | Freq: Every day | ORAL | 12 refills | Status: DC

## 2017-03-23 NOTE — Progress Notes (Signed)
Krystal Brooks  [phone] [phone]     ID: Krystal Brooks DOB: [birth year]  MR#: [id number]  XHB#:[id number]  Patient Care Team: Susy Frizzle, MD as PCP - General (Family Medicine) Excell Seltzer, MD as Consulting Physician (General Surgery) Sylvan Cheese, NP as Nurse Practitioner (Hematology and Oncology) Druscilla Brownie, MD as Consulting Physician (Dermatology) Dillingham, [name], DO as Attending Physician (Plastic Surgery) Magrinat, Virgie Dad, MD as Consulting Physician (Oncology) Chauncey Cruel, MD OTHER MD:  CHIEF COMPLAINT: Recurrent estrogen receptor positive breast cancer  CURRENT TREATMENT: Awaiting definitive surgery  INTERVAL HISTORY: Krystal Brooks returns today for follow-up and treatment of her 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 Since her last visit here she underwent bilateral mastectomies and left sided sentinel lymph node sampling. This was performed 03/16/2017. The final pathology ([id number]) showed, on the right side, no evidence of carcinoma. There were 2 lymph nodes in the sample, both benign.  On the left side there was a residual area of invasive lobular carcinoma measuring 2.3 cm, grade 2, with the single sentinel lymph node clear. Margins were negative.  She still has 2 drains in place. This is very uncomfortable. She is taking Percocet twice a day for this. It does cause her some constipation but she is dealing this with stool softeners and MiraLAX.  REVIEW OF SYSTEMS: She is limited in what she can do because of the recent surgery and because she is undergoing reconstruction. Otherwise a detailed review of systems today was noncontributory  BREAST CANCER HISTORY: From Dr Ernestina Penna 01/14/16 note:  "Krystal Brooks 61 y.o. female is here because of Her newly diagnosed right breast cancer. She is accompanied by her husband and of friend to our  multidisciplinary rest clinic today.  This was found by screening mammogram,  she denies any palpable mass, skin change or nipple discharge, or ulcer constitutional symptoms before the screening.  Her prior mammo 6 years ago. She had a right breast cyst removed in 1979. "  Malignant neoplasm of lower-inner quadrant of right breast of female, estrogen receptor positive ([facility])   12/29/2015 Mammogram    Diagnostic right mammogram showed a 2.3 x 2.1 x 2.2 cm group of coarse calcification within the medial slightly lower right breast.      01/02/2016 Initial Diagnosis    Breast cancer of lower-inner quadrant of right female breast ([facility])      01/02/2016 Initial Biopsy    right breast LIQ core needle biopsy showed LCIS, with a small focus of invasive lobular carcinoma, grade 2.       01/02/2016 Receptors her2    ER 10% positive, moderate staining. PR negative. Insufficient tissue for HER-2 testing.      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 0/2 SLN.       01/27/2016 Receptors her2    The surgical sample of the invasive carcinoma was not sufficient for Her2 test and other additional studies.      03/09/2016 - 04/19/2016 Radiation Therapy    Adjuvant breast radiation [name]). Right breast: 50 Gy in 25 fractions. Right breast "boost": 10 Gy in 5 fractions.        04/2016 -  Anti-estrogen oral therapy    Anastrozole 1 mg daily. Planned duration of therapy: 5 years        PAST MEDICAL HISTORY: Past Medical History:  Diagnosis Date  . Adie's pupil    LEFT EYE  . Allergy   . Anxiety   . Asthma   . Atherosclerosis of aorta ([facility])    bulge above renal arteries on mri  2016  . Cancer [facility]) 2017   right breast ca  . Complication of anesthesia    VERY HARD TO WAKE UP    . Hypertension   . Personal history of radiation therapy   . PONV (postoperative nausea and vomiting)   . Smoker     PAST SURGICAL HISTORY: Past Surgical History:  Procedure Laterality Date  . ABDOMINAL HYSTERECTOMY    . BREAST BIOPSY Right 01/02/2016  . BREAST EXCISIONAL BIOPSY Right 1979  . BREAST LUMPECTOMY Right 01/27/2016  . BREAST LUMPECTOMY WITH RADIOACTIVE SEED AND SENTINEL LYMPH NODE BIOPSY Right 01/27/2016   Procedure: BREAST LUMPECTOMY WITH RADIOACTIVE SEED AND SENTINEL LYMPH NODE BIOPSY;  Surgeon: Excell Seltzer, MD;  Location: [facility];  Service: General;  Laterality: Right;  . BREAST RECONSTRUCTION WITH PLACEMENT OF TISSUE EXPANDER AND FLEX HD (ACELLULAR HYDRATED DERMIS) Bilateral 03/16/2017   Procedure: BILATERAL IMMEDIATE BREAST RECONSTRUCTION WITH PLACEMENT OF TISSUE EXPANDER AND FLEX HD (ACELLULAR HYDRATED DERMIS);  Surgeon: Wallace Going, DO;  Location: [facility];  Service: Plastics;  Laterality: Bilateral;  . BREAST SURGERY  1979   cyst removed rt breast  . CATARACT EXTRACTION W/ INTRAOCULAR LENS IMPLANT     RT EYE  . CESAREAN SECTION     x2  . MASTECTOMY W/ SENTINEL NODE BIOPSY Bilateral 03/16/2017   Procedure: BILATERAL TOTAL MASTECTOMY WITH LEFT SENTINEL LYMPH NODE BIOPSY;  Surgeon: Excell Seltzer, MD;  Location: [facility];  Service: General;  Laterality: Bilateral;  . TOTAL MASTECTOMY Bilateral 03/16/2017   SENTINAL NODE BIOPSY    FAMILY HISTORY Family History  Problem Relation Age of Onset  . Hearing loss Mother   . Asthma Father   . Cancer Father        melanoma, metastases   . Diabetes Father   . Heart disease Father   . Early death Sister   . Early death Paternal Grandfather   . Cancer Maternal Grandmother 67       colon cancer   The patient's father died at age 66. She had been diagnosed with melanoma in 1 year. That was  not the cause of death. The patient's mother is living at age 62 as of May 2018. The patient has one brother, no sisters. There is no history of breast or ovarian cancer in the family, and no other melanoma history.  GYNECOLOGIC HISTORY:  No LMP recorded. Patient has had a hysterectomy. 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 She frequently keeps her 2 grandchildren. Her husband Clair Gulling is a retired Airline pilot. He now owns a business where they prepare the front car of a trailer hoarse hitch combination. Daughter April lives in [place] where she works as a Statistician. Daughter Ailene Ravel lives in [place] where she is an Forensic scientist for [employer]. The patient has 4 grandchildren. She is a Tourist information centre manager.    ADVANCED DIRECTIVES:  HEALTH MAINTENANCE: Social History  Substance Use Topics  . Smoking status: Former Smoker    Packs/day: 0.50    Years: 40.00    Types: Cigarettes  . Smokeless tobacco: Never Used  . Alcohol use 0.0 oz/week     Comment: social     Colonoscopy:  PAP:  Bone density:   Allergies  Allergen Reactions  . Penicillins Swelling and Rash    Older sister died from reaction to penicillin Has patient had a PCN reaction causing immediate rash, facial/tongue/throat swelling, SOB or lightheadedness with hypotension: Yes Has patient had a PCN reaction causing severe rash involving mucus membranes or skin necrosis: No Has patient had a PCN reaction that required hospitalization: No Has patient had a PCN reaction occurring within the last 10 years: No If all of the above answers are "NO", then may proceed with Cephalosporin use.   . Sulfa Antibiotics Rash    Current Outpatient Prescriptions  Medication Sig Dispense Refill  . acetaminophen (TYLENOL) 500  MG tablet Take 1,500 mg by mouth daily as needed for moderate pain or headache.    Marland Kitchen amLODipine (NORVASC) 10 MG tablet Take 1 tablet (10 mg total) by mouth daily. 90 tablet 3  . ANORO ELLIPTA 62.5-25 MCG/INH AEPB INHALE 1 PUFF INTO THE LUNGS DAILY (Patient taking differently: Inhale 1 puff into the lungs every evening. ) 60 each 10  . ibuprofen (ADVIL,MOTRIN) 200 MG tablet Take 600 mg by mouth daily as needed for moderate pain.    Marland Kitchen losartan (COZAAR) 100 MG tablet Take 1 tablet (100 mg total) by mouth daily. 90 tablet 3  . montelukast (SINGULAIR) 10 MG tablet TAKE 1 TABLET BY MOUTH EVERY DAY (Patient taking differently: TAKE 1 TABLET BY MOUTH EVERY DAY AT BEDTIME) 90 tablet 3  . Multiple Vitamin (MULTIVITAMIN WITH MINERALS) TABS tablet Take 1 tablet by mouth daily.    . Naphazoline HCl (CLEAR EYES OP) Place 1 drop into the right eye daily as needed (irritation).    . tamoxifen (NOLVADEX) 20 MG tablet Take 1 tablet (20 mg total) by mouth daily. 90 tablet 12  . VENTOLIN HFA 108 (90 Base) MCG/ACT inhaler USE 1-2 PUFFS EVERY 6 HOURS AS NEEDED FOR WHEEZING 18 Inhaler 3   No current facility-administered medications for this visit.     OBJECTIVE: Middle-aged white woman Who appears stated age  9:   03/23/17 1003  BP: (!) 153/73  Pulse: (!) 101  Resp: 20  Temp: 98.5 F (36.9 C)     Body mass index is 23.49 kg/m.    ECOG FS:1 - Symptomatic but completely ambulatory  Sclerae unicteric, pupils round and equal Oropharynx clear and moist No cervical or supraclavicular adenopathy Lungs no rales or rhonchi Heart regular rate and rhythm Abd soft, nontender, positive bowel sounds MSK no focal spinal tenderness, no upper extremity lymphedema Neuro: nonfocal, well oriented, appropriate affect Breasts: Status post bilateral mastectomies. She has a binder in place. The incisions appear to be healing well. There are still drains in place bilaterally. Both axillae are benign.   LAB  RESULTS:  CMP     Component Value Date/Time   NA 134 (L) 03/17/2017 0314   NA 133 (L) 01/03/2017 0922   K 4.7 03/17/2017 0314   K 3.4 (L) 01/03/2017 0922   CL 102 03/17/2017 0314   CO2 24 03/17/2017 0314   CO2 29 01/03/2017 0922   GLUCOSE 129 (H) 03/17/2017 0314   GLUCOSE 114 01/03/2017 0922   BUN 14 03/17/2017  0314   BUN 10.8 01/03/2017 0922   CREATININE 0.68 03/17/2017 0314   CREATININE 0.58 03/08/2017 1444   CREATININE 0.7 01/03/2017 0922   CALCIUM 8.8 (L) 03/17/2017 0314   CALCIUM 9.5 01/03/2017 0922   PROT 7.1 01/03/2017 0922   ALBUMIN 3.8 01/03/2017 0922   AST 18 01/03/2017 0922   ALT 19 01/03/2017 0922   ALKPHOS 60 01/03/2017 0922   BILITOT 0.47 01/03/2017 0922   GFRNONAA >60 03/17/2017 0314   GFRNONAA >89 03/08/2017 1444   GFRAA >60 03/17/2017 0314   GFRAA >89 03/08/2017 1444    No results found for: TOTALPROTELP, ALBUMINELP, A1GS, A2GS, BETS, BETA2SER, GAMS, MSPIKE, SPEI  No results found for: Nils Pyle, [facility]  Lab Results  Component Value Date   WBC 14.0 (H) 03/17/2017   NEUTROABS 3.1 01/03/2017   HGB 11.3 (L) 03/17/2017   HCT 34.3 (L) 03/17/2017   MCV 89.6 03/17/2017   PLT 209 03/17/2017      Chemistry      Component Value Date/Time   NA 134 (L) 03/17/2017 0314   NA 133 (L) 01/03/2017 0922   K 4.7 03/17/2017 0314   K 3.4 (L) 01/03/2017 0922   CL 102 03/17/2017 0314   CO2 24 03/17/2017 0314   CO2 29 01/03/2017 0922   BUN 14 03/17/2017 0314   BUN 10.8 01/03/2017 0922   CREATININE 0.68 03/17/2017 0314   CREATININE 0.58 03/08/2017 1444   CREATININE 0.7 01/03/2017 0922      Component Value Date/Time   CALCIUM 8.8 (L) 03/17/2017 0314   CALCIUM 9.5 01/03/2017 0922   ALKPHOS 60 01/03/2017 0922   AST 18 01/03/2017 0922   ALT 19 01/03/2017 0922   BILITOT 0.47 01/03/2017 0922       No results found for: LABCA2  No components found for: [id number]  No results for input(s): INR in the last 168 hours.  Urinalysis No  results found for: COLORURINE, APPEARANCEUR, LABSPEC, PHURINE, GLUCOSEU, HGBUR, BILIRUBINUR, KETONESUR, PROTEINUR, UROBILINOGEN, NITRITE, LEUKOCYTESUR   STUDIES: No results found.   ELIGIBLE FOR AVAILABLE RESEARCH PROTOCOL: no  ASSESSMENT: 61 y.o. [place] woman, with bilateral breast cancer  (1) status post right lumpectomy and sentinel lymph node sampling 01/27/2016 for lobular carcinoma in situ, pleomorphic variant, with multiple foci of invasive lobular carcinoma, all less than a millimeter, but with negative margins and both sentinel lymph nodes negative, invasive disease being estrogen receptor positive, HER-2 not tested (pT1 [mic] pN0}  (2) adjuvant radiation 03/09/2016 through 04/19/2016 The patient initially received a dose of 50Gy in 65factions to the breast using whole-breast tangent fields. This was delivered using a 3-D conformal technique. The patient then received a boost to the seroma. This delivered an additional 10Gy in 562fctions using an en face electron field due to the depth of the seroma. The total dose was 60Gy.  (3) anastrozole started June 2017  (4) status post left breast lower inner quadrant biopsy 12/29/2016 for a clinical T1c N0  invasive lobular breast cancer, grade 2, estrogen receptor positive, progesterone receptor negative HER-2 nonamplified, with a signals ratio 1.35 and the number per cell 2.70.  (5) intermediate Oncotype score of 22 predicts a 10 year risk of recurrence outside the breast of 14% if the patient's only systemic therapy is tamoxifen for 5 years.  (a) the patient opted against chemotherapy (TLovena Lex results confirm choice)  (6) status post bilateral mastectomies and left sentinel lymph node sampling 03/16/2017 showing  (a) on the right, no evidence of carcinoma  (  b) on the left, residual pT2 pN0, stage IIA invasive lobular carcinoma, grade 2, with negative margins.  (7) had immediate expander placement at the time of  bilateral mastectomies, with implant reconstruction pending  (8) to start tamoxifen 04/30/2017  PLAN: Therasa has completed her local treatment, although of course she still has the reconstruction to go through. Right now she is quite uncomfortable because the drains are still in place. As soon as they are out she should start feeling better,, for narcotics, start using nonsteroidals, and improve her functional status, increasing her activity as directed by Dr.Dillingham.  Today we discussed systemic therapy in detail. We reviewed the results of the tailor Rx study which confirmed her decision against chemotherapy giving her low intermediate Oncotype score. We also reviewed the fact that the cancer grew despite her being on an aromatase inhibitor.  Accordingly we are going to switch over to tamoxifen. We discussed the possible toxicities side effects and complications of this agent and that information was given to her in writing.  I think she will be able to start tamoxifen safely around September 1 or so. She will call if she has any problems. She does not need to interrupted for her upcoming implant surgery whenever that gets to be.  She will see me again in mid November. If she tolerates tamoxifen well the plan will be to continue that a minimum of 5 years.  She knows to call for any problems that may develop before the next visit.  Chauncey Cruel, MD   03/23/2017 8:42 PM Medical Oncology and Hematology [facility] [address] [place], [place] [address] Tel. [phone]    Fax. [phone]

## 2017-04-06 ENCOUNTER — Encounter: Payer: Self-pay | Admitting: Adult Health

## 2017-04-19 ENCOUNTER — Ambulatory Visit: Payer: 59 | Attending: Plastic Surgery | Admitting: Physical Therapy

## 2017-04-19 ENCOUNTER — Encounter: Payer: Self-pay | Admitting: Physical Therapy

## 2017-04-19 DIAGNOSIS — M25611 Stiffness of right shoulder, not elsewhere classified: Secondary | ICD-10-CM | POA: Diagnosis not present

## 2017-04-19 DIAGNOSIS — R293 Abnormal posture: Secondary | ICD-10-CM

## 2017-04-19 DIAGNOSIS — M6281 Muscle weakness (generalized): Secondary | ICD-10-CM

## 2017-04-19 DIAGNOSIS — M25511 Pain in right shoulder: Secondary | ICD-10-CM

## 2017-04-19 DIAGNOSIS — M25612 Stiffness of left shoulder, not elsewhere classified: Secondary | ICD-10-CM

## 2017-04-19 DIAGNOSIS — M25512 Pain in left shoulder: Secondary | ICD-10-CM

## 2017-04-19 NOTE — Therapy (Signed)
[place], [place], [address] Phone: [phone]   Fax:  [phone]  Physical Therapy Evaluation  Patient Details  Name: Krystal Brooks MRN: [id number] Date of Birth: [birth year] Referring Provider: Marla Roe  Encounter Date: 04/19/2017    Past Medical History:  Diagnosis Date  . Adie's pupil    LEFT EYE  . Allergy   . Anxiety   . Asthma   . Atherosclerosis of aorta (HCC)    bulge above renal arteries on mri 2016  . Cancer [facility]) 2017   right breast ca  . Complication of anesthesia    VERY HARD TO WAKE UP    . Hypertension   . Personal history of radiation therapy   . PONV (postoperative nausea and vomiting)   . Smoker     Past Surgical History:  Procedure Laterality Date  . ABDOMINAL HYSTERECTOMY    . BREAST BIOPSY Right 01/02/2016  . BREAST EXCISIONAL BIOPSY Right 1979  . BREAST LUMPECTOMY Right 01/27/2016  . BREAST LUMPECTOMY WITH RADIOACTIVE SEED AND SENTINEL LYMPH NODE BIOPSY Right 01/27/2016   Procedure: BREAST LUMPECTOMY WITH RADIOACTIVE SEED AND SENTINEL LYMPH NODE BIOPSY;  Surgeon: Excell Seltzer, MD;  Location: [place];  Service: General;  Laterality: Right;  . BREAST RECONSTRUCTION WITH PLACEMENT OF TISSUE EXPANDER AND FLEX HD (ACELLULAR HYDRATED DERMIS) Bilateral 03/16/2017   Procedure: BILATERAL IMMEDIATE BREAST RECONSTRUCTION WITH PLACEMENT OF TISSUE EXPANDER AND FLEX HD (ACELLULAR HYDRATED DERMIS);  Surgeon: Wallace Going, DO;  Location: [place];  Service: Plastics;  Laterality: Bilateral;  . BREAST SURGERY  1979   cyst removed rt breast  . CATARACT EXTRACTION W/ INTRAOCULAR LENS IMPLANT     RT EYE  . CESAREAN SECTION     x2  . MASTECTOMY W/ SENTINEL NODE BIOPSY Bilateral 03/16/2017   Procedure: BILATERAL TOTAL MASTECTOMY WITH LEFT SENTINEL LYMPH NODE BIOPSY;  Surgeon: Excell Seltzer, MD;  Location: [place];  Service: General;  Laterality: Bilateral;   . TOTAL MASTECTOMY Bilateral 03/16/2017   SENTINAL NODE BIOPSY    There were no vitals filed for this visit.       Subjective Assessment - 04/19/17 0937    Subjective First diagnosed with breast cancer on right side in May 2017. I underwent a lumpectomy and SLNB with 2 nodes removed as well as radiation. Nodes were clear. I was diagnosed in May 2018 with left breast cancer. Underwent a bilateral mastectomy with SLNB on the left with 1 node removed that was negative. I did not need any chemo or radiation. I also have numbness in my first 3 fingers that got worse after surgery.   Pertinent History asthma, 2017 R breast cancer s/p lumpectomy, SLNB and radiation, 2018 L breast cancer s/p bilateral mastectomy and L SLNB   Patient Stated Goals to get more ROM in my right arm   Currently in Pain? Yes   Pain Score 4    Pain Location Chest   Pain Orientation Right   Pain Descriptors / Indicators Discomfort   Pain Type Surgical pain   Pain Radiating Towards radiating towards right lateral trunk   Pain Onset 1 to 4 weeks ago   Pain Frequency Intermittent   Aggravating Factors  when more active   Pain Relieving Factors not using R UE, tylenol, valium- to help get comfortable to sleep   Effect of Pain on Daily Activities hard to sleep, difficult to complete ADLs  [facility] PT Assessment - 04/19/17 0001      Assessment   Medical Diagnosis right and left breast cancer   Referring Provider [place]   Onset Date/Surgical Date 03/16/17  01/27/2016- right lumpectomy   Hand Dominance Right   Prior Therapy none     Precautions   Precautions Other (comment)  at risk for lymphedema     Restrictions   Weight Bearing Restrictions No     Balance Screen   Has the patient fallen in the past 6 months No   Has the patient had a decrease in activity level because of a fear of falling?  No   Is the patient reluctant to leave their home because of a fear of falling?  No     Home  [place] residence   Living Arrangements Spouse/significant other   Available Help at Discharge Family   Type of [place] to enter   Entrance Stairs-Number of Steps 2   Entrance Stairs-Rails Right   Home Layout Multi-level   Alternate Level Stairs-Number of Steps 14   Alternate Level Stairs-Rails Right;Left  depending on which floor     Prior Function   Level of Independence Needs assistance with homemaking  needs assist with vacuuming, laundry, reaching things   Vocation Retired   Leisure pt walks regularly- no more than 30 minutes     Cognition   Overall Cognitive Status Within Functional Limits for tasks assessed     Observation/Other Assessments   Other Surveys  --  LLIS: 28% impairment     Posture/Postural Control   Posture/Postural Control Postural limitations   Postural Limitations Rounded Shoulders;Forward head     ROM / Strength   AROM / PROM / Strength AROM     AROM   AROM Assessment Site Shoulder   Right/Left Shoulder Right;Left   Right Shoulder Flexion 130 Degrees   Right Shoulder ABduction 94 Degrees  with some scaption   Right Shoulder Internal Rotation 66 Degrees   Right Shoulder External Rotation 76 Degrees   Left Shoulder Flexion 115 Degrees   Left Shoulder ABduction 89 Degrees   Left Shoulder Internal Rotation 79 Degrees   Left Shoulder External Rotation 74 Degrees           LYMPHEDEMA/ONCOLOGY QUESTIONNAIRE - 04/19/17 0957      Type   Cancer Type right and left breast cancer     Surgeries   Mastectomy Date 03/16/17   Lumpectomy Date 01/27/16   Sentinel Lymph Node Biopsy Date 03/16/17  01/26/17   Number Lymph Nodes Removed 2  on R, 1 on L     Date Lymphedema/Swelling Started   Date 03/29/17     Treatment   Active Chemotherapy Treatment No   Past Chemotherapy Treatment No   Active Radiation Treatment No   Past Radiation Treatment Yes   Date 04/13/16   Body Site R breast    Current Hormone Treatment No  will begin Tamoxifen   Past Hormone Therapy No     What other symptoms do you have   Are you Having Heaviness or Tightness Yes   Are you having Pain Yes   Are you having pitting edema No   Is it Hard or Difficult finding clothes that fit No   Do you have infections No   Is there Decreased scar mobility Yes     Lymphedema Assessments   Lymphedema Assessments Upper extremities     Right Upper  Extremity Lymphedema   15 cm Proximal to Olecranon Process 24.7 cm   10 cm Proximal to Olecranon Process 23.6 cm   Olecranon Process 20.5 cm   15 cm Proximal to Ulnar Styloid Process 21.2 cm   10 cm Proximal to Ulnar Styloid Process 18.8 cm   Just Proximal to Ulnar Styloid Process 14 cm   Across Hand at [employer] 17.6 cm   At [place] of 2nd Digit 5.8 cm     Left Upper Extremity Lymphedema   15 cm Proximal to Olecranon Process 24.5 cm   10 cm Proximal to Olecranon Process 23.1 cm   Olecranon Process 20.5 cm   15 cm Proximal to Ulnar Styloid Process 20.3 cm   10 cm Proximal to Ulnar Styloid Process 18 cm   Just Proximal to Ulnar Styloid Process 13.9 cm   Across Hand at [employer] 16.5 cm   At [place] of 2nd Digit 5.5 cm           Quick Dash - 04/19/17 0001    Open a tight or new jar Moderate difficulty   Do heavy household chores (wash walls, wash floors) Unable   Carry a shopping bag or briefcase Moderate difficulty   Wash your back Moderate difficulty   Use a knife to cut food No difficulty   Recreational activities in which you take some force or impact through your arm, shoulder, or hand (golf, hammering, tennis) Unable   During the past week, to what extent has your arm, shoulder or hand problem interfered with your normal social activities with family, friends, neighbors, or groups? Modererately   During the past week, to what extent has your arm, shoulder or hand problem limited your work or other regular daily activities Modererately   Arm,  shoulder, or hand pain. Mild   Tingling (pins and needles) in your arm, shoulder, or hand Moderate   Difficulty Sleeping Mild difficulty   DASH Score 50 %      Objective measurements completed on examination: See above findings.                  PT Education - 04/19/17 1224    Education provided Yes   Education Details lymphedema risk reduction practices   Person(s) Educated Patient   Methods Explanation;Handout   Comprehension Verbalized understanding                Long Term Clinic Goals - 04/19/17 1229      CC Long Term Goal  #1   Title Pt will be able to independently verbalize lymphedema risk reduction practices   Time 4   Period Weeks   Status New   Target Date 05/17/17     CC Long Term Goal  #2   Title Pt will demonstrate 150 degrees of bilateral shoulder flexion to allow pt to reach items on top shelf and reach up to wash her hair   Baseline R 130, L 115   Time 4   Period Weeks   Status New   Target Date 05/17/17     CC Long Term Goal  #3   Title Pt to demonstrate 150 degrees of bilateral shoulder abduction to allow pt to reach out to sides   Baseline R 94, L 89   Time 4   Period Weeks   Status New   Target Date 05/17/17     CC Long Term Goal  #4   Title Pt to be independent in a  home exercise program for continued strengthening and stretching.    Time 4   Period Weeks   Status New   Target Date 05/17/17     CC Long Term Goal  #5   Title Pt to report a 75% improvement in edema in right lateral trunk to allow improved comfort and decrease pain   Time 4   Period Weeks   Status New             Plan - 04/19/17 1101    Clinical Impression Statement Pt presents to PT following a bilateral mastectomy for treatment of left breast cancer. She was first diagnosed with right breast cancer in May 2017 and underwent a lumpectomy and SLNB as well as completed radiation. In May of 2018 she was diagnosed with left breast cancer and  underwent a bilateral mastectomy. Since that time she has had tightness in bilateral shoulders with L more limited than R but she has more discomfort in her R shoulder. She also has noticed increased edema in her right lateral trunk that causes discomfort when she puts her arm down. She has expanders in place and will have reconstruction in October 2018. She is currently having difficulty driving, washing hair and performing household chores due to limited ROM. Pt would benefit from skilled PT services to increase bilateral shoulder ROM and strength, decrease pec tightness, decrease pain and decrease R lateral trunk edema to allow for improved function.    History and Personal Factors relevant to plan of care: history of right breast cancer with SLNB on R and radiation, pt is right handed   Clinical Presentation Evolving   Clinical Presentation due to: pt has expanders placed now and right side has been radiated causing increased tightness   Clinical Decision Making Moderate   Rehab Potential Good   Clinical Impairments Affecting Rehab Potential bilateral breast cancer, hx of radiation   PT Frequency 2x / week   PT Duration 4 weeks   PT Treatment/Interventions ADLs/Self Care Home Management;Therapeutic exercise;Patient/family education;Manual techniques;Manual lymph drainage;Scar mobilization;Passive range of motion;Taping   PT Next Visit Plan begin gentle AA/A/PROM to bilateral shoulders, issue supine dowel exercises and pec stretch   Consulted and Agree with Plan of Care Patient      Patient will benefit from skilled therapeutic intervention in order to improve the following deficits and impairments:  Increased fascial restricitons, Pain, Postural dysfunction, Decreased scar mobility, Decreased range of motion, Decreased strength, Impaired UE functional use, Increased edema, Decreased knowledge of precautions  Visit Diagnosis: Stiffness of right shoulder, not elsewhere classified - Plan: PT plan  of care cert/re-cert  Stiffness of left shoulder, not elsewhere classified - Plan: PT plan of care cert/re-cert  Acute pain of right shoulder - Plan: PT plan of care cert/re-cert  Acute pain of left shoulder - Plan: PT plan of care cert/re-cert  Muscle weakness (generalized) - Plan: PT plan of care cert/re-cert  Abnormal posture - Plan: PT plan of care cert/re-cert     Problem List Patient Active Problem List   Diagnosis Date Noted  . Breast cancer ([place]) 03/16/2017  . Malignant neoplasm of upper-inner quadrant of left breast in female, estrogen receptor positive ([place]) 01/03/2017  . HTN (hypertension) 06/14/2016  . Malignant neoplasm of lower-inner quadrant of right breast of female, estrogen receptor positive ([place]) 01/07/2016  . Atherosclerosis of aorta ([place])   . Smoker     Allyson Sabal [facility] 04/19/2017, 12:36 PM  [place]  [place], [place], [address] Phone: [phone]   Fax:  [phone]  Name: Krystal Brooks MRN: [id number] Date of Birth: 1956/02/18  Allyson Sabal [place], PT 04/19/17 12:36 PM

## 2017-04-20 ENCOUNTER — Ambulatory Visit: Payer: 59

## 2017-04-20 DIAGNOSIS — R293 Abnormal posture: Secondary | ICD-10-CM

## 2017-04-20 DIAGNOSIS — M6281 Muscle weakness (generalized): Secondary | ICD-10-CM

## 2017-04-20 DIAGNOSIS — M25611 Stiffness of right shoulder, not elsewhere classified: Secondary | ICD-10-CM

## 2017-04-20 DIAGNOSIS — M25511 Pain in right shoulder: Secondary | ICD-10-CM

## 2017-04-20 DIAGNOSIS — M25512 Pain in left shoulder: Secondary | ICD-10-CM

## 2017-04-20 DIAGNOSIS — M25612 Stiffness of left shoulder, not elsewhere classified: Secondary | ICD-10-CM

## 2017-04-20 NOTE — Patient Instructions (Addendum)
SHOULDER: Flexion - Supine (Cane)        Cancer Rehab [phone]    Hold cane in both hands. Raise arms up overhead. Do not allow back to arch. Hold _5__ seconds. Do __5-10__ times; __1-2__ times a day.   SELF ASSISTED WITH OBJECT: Shoulder Abduction / Adduction - Supine    Hold cane with both hands. Move both arms from side to side, keep elbows straight.  Hold when stretch felt for __5__ seconds. Repeat __5-10__ times; __1-2__ times a day. Once this becomes easier progress to third picture bringing affected arm towards ear by staying out to side. Same hold for _5_seconds. Repeat  _5-10_ times, _1-2_ times/day.  Shoulder Blade Stretch    Clasp fingers behind head with elbows touching in front of face. Pull elbows back while pressing shoulder blades together. Relax and hold as tolerated, can place pillow under elbow here for comfort as needed and to allow for prolonged stretch.  Repeat __5__ times. Do __1-2__ sessions per day.     SHOULDER: External Rotation - Supine (Cane)    Hold cane with both hands. Rotate arm away from body. Keep elbow on floor and next to body. _5-10__ reps per set, hold 5 seconds, _1-2__ sets per day. Add towel to keep elbow at side.  Copyright  VHI. All rights reserved.                    CHEST: Doorway, Bilateral - Standing    Standing in doorway, place hands on wall with elbows bent at shoulder height. Lean forward. Hold ___ seconds. ___ reps per set, ___ sets per day, ___ days per week  Copyright  VHI. All rights reserved.

## 2017-04-20 NOTE — Therapy (Signed)
[place], [place], [address] Phone: [phone]   Fax:  [phone]  Physical Therapy Treatment  Patient Details  Name: Krystal Brooks MRN: [id number] Date of Birth: 07/02/1956 Referring Provider: Marla Roe  Encounter Date: 04/20/2017      PT End of Session - 04/20/17 1520    Visit Number 2   Number of Visits 8   Date for PT Re-Evaluation 05/17/17   PT Start Time 9983   PT Stop Time 1518   PT Time Calculation (min) 47 min   Activity Tolerance Patient tolerated treatment well   Behavior During Therapy [facility] for tasks assessed/performed      Past Medical History:  Diagnosis Date  . Adie's pupil    LEFT EYE  . Allergy   . Anxiety   . Asthma   . Atherosclerosis of aorta (HCC)    bulge above renal arteries on mri 2016  . Cancer [facility]) 2017   right breast ca  . Complication of anesthesia    VERY HARD TO WAKE UP    . Hypertension   . Personal history of radiation therapy   . PONV (postoperative nausea and vomiting)   . Smoker     Past Surgical History:  Procedure Laterality Date  . ABDOMINAL HYSTERECTOMY    . BREAST BIOPSY Right 01/02/2016  . BREAST EXCISIONAL BIOPSY Right 1979  . BREAST LUMPECTOMY Right 01/27/2016  . BREAST LUMPECTOMY WITH RADIOACTIVE SEED AND SENTINEL LYMPH NODE BIOPSY Right 01/27/2016   Procedure: BREAST LUMPECTOMY WITH RADIOACTIVE SEED AND SENTINEL LYMPH NODE BIOPSY;  Surgeon: Excell Seltzer, MD;  Location: [place];  Service: General;  Laterality: Right;  . BREAST RECONSTRUCTION WITH PLACEMENT OF TISSUE EXPANDER AND FLEX HD (ACELLULAR HYDRATED DERMIS) Bilateral 03/16/2017   Procedure: BILATERAL IMMEDIATE BREAST RECONSTRUCTION WITH PLACEMENT OF TISSUE EXPANDER AND FLEX HD (ACELLULAR HYDRATED DERMIS);  Surgeon: Wallace Going, DO;  Location: [place];  Service: Plastics;  Laterality: Bilateral;  . BREAST SURGERY  1979   cyst removed rt breast  .  CATARACT EXTRACTION W/ INTRAOCULAR LENS IMPLANT     RT EYE  . CESAREAN SECTION     x2  . MASTECTOMY W/ SENTINEL NODE BIOPSY Bilateral 03/16/2017   Procedure: BILATERAL TOTAL MASTECTOMY WITH LEFT SENTINEL LYMPH NODE BIOPSY;  Surgeon: Excell Seltzer, MD;  Location: [place];  Service: General;  Laterality: Bilateral;  . TOTAL MASTECTOMY Bilateral 03/16/2017   SENTINAL NODE BIOPSY    There were no vitals filed for this visit.      Subjective Assessment - 04/20/17 1434    Subjective Nothing new since I was here yesterday. My shoulders are just always tight, sore and touchy, not painful at all.    Pertinent History asthma, 2017 R breast cancer s/p lumpectomy, SLNB and radiation, 2018 L breast cancer s/p bilateral mastectomy and L SLNB   Patient Stated Goals to get more ROM in my right arm   Currently in Pain? No/denies               LYMPHEDEMA/ONCOLOGY QUESTIONNAIRE - 04/19/17 0957      Type   Cancer Type right and left breast cancer     Surgeries   Mastectomy Date 03/16/17   Lumpectomy Date 01/27/16   Sentinel Lymph Node Biopsy Date 03/16/17  01/26/17   Number Lymph Nodes Removed 2  on R, 1 on L     Date Lymphedema/Swelling Started   Date 03/29/17     Treatment  Active Chemotherapy Treatment No   Past Chemotherapy Treatment No   Active Radiation Treatment No   Past Radiation Treatment Yes   Date 04/13/16   Body Site R breast   Current Hormone Treatment No  will begin Tamoxifen   Past Hormone Therapy No     What other symptoms do you have   Are you Having Heaviness or Tightness Yes   Are you having Pain Yes   Are you having pitting edema No   Is it Hard or Difficult finding clothes that fit No   Do you have infections No   Is there Decreased scar mobility Yes     Lymphedema Assessments   Lymphedema Assessments Upper extremities     Right Upper Extremity Lymphedema   15 cm Proximal to Olecranon Process 24.7 cm   10 cm Proximal to Olecranon Process 23.6  cm   Olecranon Process 20.5 cm   15 cm Proximal to Ulnar Styloid Process 21.2 cm   10 cm Proximal to Ulnar Styloid Process 18.8 cm   Just Proximal to Ulnar Styloid Process 14 cm   Across Hand at [employer] 17.6 cm   At [place] of 2nd Digit 5.8 cm     Left Upper Extremity Lymphedema   15 cm Proximal to Olecranon Process 24.5 cm   10 cm Proximal to Olecranon Process 23.1 cm   Olecranon Process 20.5 cm   15 cm Proximal to Ulnar Styloid Process 20.3 cm   10 cm Proximal to Ulnar Styloid Process 18 cm   Just Proximal to Ulnar Styloid Process 13.9 cm   Across Hand at [employer] 16.5 cm   At [place] of 2nd Digit 5.5 cm                  [facility] Adult PT Treatment/Exercise - 04/20/17 0001      Shoulder Exercises: Supine   Horizontal ABduction AAROM;Left;5 reps  5 second holds with dowel, unable to do Rt due to room   External Rotation AAROM;Both  5 second holds with dowel   Flexion AAROM;Both;5 reps  5 second holds with dowel   Other Supine Exercises Fingers clasped behind head and opening elbows for pectoralis stretch 5 times with 5 sec holds   Other Supine Exercises Supine over towel roll at thoracic spine for bil horz abd and then into a "V" 5 times each     Manual Therapy   Manual Therapy Passive ROM;Myofascial release   Myofascial Release To bil axillae near pect insertion during P/ROM   Passive ROM To bil shoulders into flexion, abduction and er to pts tolerance                PT Education - 04/20/17 1443    Education provided Yes   Education Details Supine stetches with dowel and er stretch with hands behind head   Person(s) Educated Patient   Methods Explanation;Demonstration;Handout   Comprehension Verbalized understanding;Returned demonstration;Need further instruction                Long Term Clinic Goals - 04/19/17 1229      CC Long Term Goal  #1   Title Pt will be able to independently verbalize lymphedema risk reduction practices    Time 4   Period Weeks   Status New   Target Date 05/17/17     CC Long Term Goal  #2   Title Pt will demonstrate 150 degrees of bilateral shoulder flexion to allow pt to reach  items on top shelf and reach up to wash her hair   Baseline R 130, L 115   Time 4   Period Weeks   Status New   Target Date 05/17/17     CC Long Term Goal  #3   Title Pt to demonstrate 150 degrees of bilateral shoulder abduction to allow pt to reach out to sides   Baseline R 94, L 89   Time 4   Period Weeks   Status New   Target Date 05/17/17     CC Long Term Goal  #4   Title Pt to be independent in a home exercise program for continued strengthening and stretching.    Time 4   Period Weeks   Status New   Target Date 05/17/17     CC Long Term Goal  #5   Title Pt to report a 75% improvement in edema in right lateral trunk to allow improved comfort and decrease pain   Time 4   Period Weeks   Status New            Plan - 04/20/17 1532    Clinical Impression Statement Pt tolerated first session of stetching well though is limited at her end ROM by guarding, protective posture. She is able to relax well though with VC. She tolerated supine dowel exercises well with no increased pain, just reports of tightness at end ROM's. She was instructed to not push into pain with end range stretching and she verbalized understanding this.    Rehab Potential Good   Clinical Impairments Affecting Rehab Potential bilateral breast cancer, hx of radiation   PT Frequency 2x / week   PT Duration 4 weeks   PT Treatment/Interventions ADLs/Self Care Home Management;Therapeutic exercise;Patient/family education;Manual techniques;Manual lymph drainage;Scar mobilization;Passive range of motion;Taping   PT Next Visit Plan Assess how pt felt after todays visit. Cont gentle AA/A/PROM to bilateral shoulders, review supine dowel exercises and pec stretch   Consulted and Agree with Plan of Care Patient      Patient will benefit  from skilled therapeutic intervention in order to improve the following deficits and impairments:  Increased fascial restricitons, Pain, Postural dysfunction, Decreased scar mobility, Decreased range of motion, Decreased strength, Impaired UE functional use, Increased edema, Decreased knowledge of precautions  Visit Diagnosis: Stiffness of right shoulder, not elsewhere classified  Stiffness of left shoulder, not elsewhere classified  Acute pain of right shoulder  Acute pain of left shoulder  Abnormal posture  Muscle weakness (generalized)     Problem List Patient Active Problem List   Diagnosis Date Noted  . Breast cancer ([place]) 03/16/2017  . Malignant neoplasm of upper-inner quadrant of left breast in female, estrogen receptor positive ([place]) 01/03/2017  . HTN (hypertension) 06/14/2016  . Malignant neoplasm of lower-inner quadrant of right breast of female, estrogen receptor positive ([place]) 01/07/2016  . Atherosclerosis of aorta ([place])   . Smoker     Otelia Limes, [place] 04/20/2017, 4:07 PM  [place], [place], [address] Phone: [phone]   Fax:  [phone]  Name: Krystal Brooks MRN: [id number] Date of Birth: Mar 26, 1956

## 2017-04-22 ENCOUNTER — Ambulatory Visit: Payer: 59 | Admitting: Physical Therapy

## 2017-04-22 DIAGNOSIS — M25512 Pain in left shoulder: Secondary | ICD-10-CM

## 2017-04-22 DIAGNOSIS — M25511 Pain in right shoulder: Secondary | ICD-10-CM

## 2017-04-22 DIAGNOSIS — R293 Abnormal posture: Secondary | ICD-10-CM

## 2017-04-22 DIAGNOSIS — M25612 Stiffness of left shoulder, not elsewhere classified: Secondary | ICD-10-CM

## 2017-04-22 DIAGNOSIS — M25611 Stiffness of right shoulder, not elsewhere classified: Secondary | ICD-10-CM | POA: Diagnosis not present

## 2017-04-22 NOTE — Therapy (Signed)
[place], [place], [address] Phone: [phone]   Fax:  [phone]  Physical Therapy Treatment  Patient Details  Name: Krystal Brooks MRN: [id number] Date of Birth: [birth year] Referring Provider: Marla Roe  Encounter Date: 04/22/2017      PT End of Session - 04/22/17 1234    Visit Number 3   Number of Visits 8   Date for PT Re-Evaluation 05/17/17   PT Start Time 0802   PT Stop Time 0846   PT Time Calculation (min) 44 min   Activity Tolerance Patient tolerated treatment well   Behavior During Therapy [facility] for tasks assessed/performed      Past Medical History:  Diagnosis Date  . Adie's pupil    LEFT EYE  . Allergy   . Anxiety   . Asthma   . Atherosclerosis of aorta (HCC)    bulge above renal arteries on mri 2016  . Cancer [facility]) 2017   right breast ca  . Complication of anesthesia    VERY HARD TO WAKE UP    . Hypertension   . Personal history of radiation therapy   . PONV (postoperative nausea and vomiting)   . Smoker     Past Surgical History:  Procedure Laterality Date  . ABDOMINAL HYSTERECTOMY    . BREAST BIOPSY Right 01/02/2016  . BREAST EXCISIONAL BIOPSY Right 1979  . BREAST LUMPECTOMY Right 01/27/2016  . BREAST LUMPECTOMY WITH RADIOACTIVE SEED AND SENTINEL LYMPH NODE BIOPSY Right 01/27/2016   Procedure: BREAST LUMPECTOMY WITH RADIOACTIVE SEED AND SENTINEL LYMPH NODE BIOPSY;  Surgeon: Excell Seltzer, MD;  Location: [place];  Service: General;  Laterality: Right;  . BREAST RECONSTRUCTION WITH PLACEMENT OF TISSUE EXPANDER AND FLEX HD (ACELLULAR HYDRATED DERMIS) Bilateral 03/16/2017   Procedure: BILATERAL IMMEDIATE BREAST RECONSTRUCTION WITH PLACEMENT OF TISSUE EXPANDER AND FLEX HD (ACELLULAR HYDRATED DERMIS);  Surgeon: Wallace Going, DO;  Location: [place];  Service: Plastics;  Laterality: Bilateral;  . BREAST SURGERY  1979   cyst removed rt breast  .  CATARACT EXTRACTION W/ INTRAOCULAR LENS IMPLANT     RT EYE  . CESAREAN SECTION     x2  . MASTECTOMY W/ SENTINEL NODE BIOPSY Bilateral 03/16/2017   Procedure: BILATERAL TOTAL MASTECTOMY WITH LEFT SENTINEL LYMPH NODE BIOPSY;  Surgeon: Excell Seltzer, MD;  Location: [place];  Service: General;  Laterality: Bilateral;  . TOTAL MASTECTOMY Bilateral 03/16/2017   SENTINAL NODE BIOPSY    There were no vitals filed for this visit.      Subjective Assessment - 04/22/17 0805    Subjective "I'm just sore.  I'm always sore on the right but now I'm sore on the left too."   Pertinent History asthma, 2017 R breast cancer s/p lumpectomy, SLNB and radiation, 2018 L breast cancer s/p bilateral mastectomy and L SLNB   Currently in Pain? Yes   Pain Score 4    Pain Location Breast  and left shoulder area   Pain Orientation Right   Pain Descriptors / Indicators Sore   Aggravating Factors  constant   Pain Relieving Factors nothing                         [facility] Adult PT Treatment/Exercise - 04/22/17 0001      Shoulder Exercises: Supine   Other Supine Exercises Reviewed HEP given last time and had patient perform each 3 times with verbal and demonstration cues.  Manual Therapy   Manual Therapy Soft tissue mobilization;Manual Lymphatic Drainage (MLD)   Soft tissue mobilization gently at right lateral breast area of tightness   Myofascial Release crosshands at right axilla; crosshands at right trunk in vertical direction and diagonal; unidirectional pull at right pect insertion area   Manual Lymphatic Drainage (MLD) deep breathing, right inguinal nodes and right axillo-inguinal anastomosis   Passive ROM To bil shoulders into flexion, abduction and er to pts tolerance                        Long Term Clinic Goals - 04/19/17 1229      CC Long Term Goal  #1   Title Pt will be able to independently verbalize lymphedema risk reduction practices   Time 4   Period  Weeks   Status New   Target Date 05/17/17     CC Long Term Goal  #2   Title Pt will demonstrate 150 degrees of bilateral shoulder flexion to allow pt to reach items on top shelf and reach up to wash her hair   Baseline R 130, L 115   Time 4   Period Weeks   Status New   Target Date 05/17/17     CC Long Term Goal  #3   Title Pt to demonstrate 150 degrees of bilateral shoulder abduction to allow pt to reach out to sides   Baseline R 94, L 89   Time 4   Period Weeks   Status New   Target Date 05/17/17     CC Long Term Goal  #4   Title Pt to be independent in a home exercise program for continued strengthening and stretching.    Time 4   Period Weeks   Status New   Target Date 05/17/17     CC Long Term Goal  #5   Title Pt to report a 75% improvement in edema in right lateral trunk to allow improved comfort and decrease pain   Time 4   Period Weeks   Status New            Plan - 04/22/17 1234    Clinical Impression Statement Pt. with significantly limited ROM both shoulders; adhesions right shoulder/breast reconstruction area. Although she had discomfort with some stretches, she reported feeling better and looser after her session today.   Rehab Potential Good   Clinical Impairments Affecting Rehab Potential bilateral breast cancer, hx of radiation   PT Frequency 2x / week   PT Duration 4 weeks   PT Treatment/Interventions ADLs/Self Care Home Management;Therapeutic exercise;Patient/family education;Manual techniques;Manual lymph drainage;Scar mobilization;Passive range of motion;Taping   PT Next Visit Plan Assess how pt felt after todays visit. Cont gentle AA/A/PROM to bilateral shoulders.   PT Home Exercise Plan supine dowel exercises   Consulted and Agree with Plan of Care Patient      Patient will benefit from skilled therapeutic intervention in order to improve the following deficits and impairments:  Increased fascial restricitons, Pain, Postural dysfunction,  Decreased scar mobility, Decreased range of motion, Decreased strength, Impaired UE functional use, Increased edema, Decreased knowledge of precautions  Visit Diagnosis: Stiffness of right shoulder, not elsewhere classified  Stiffness of left shoulder, not elsewhere classified  Acute pain of right shoulder  Acute pain of left shoulder  Abnormal posture     Problem List Patient Active Problem List   Diagnosis Date Noted  . Breast cancer ([place]) 03/16/2017  . Malignant neoplasm of  upper-inner quadrant of left breast in female, estrogen receptor positive ([place]) 01/03/2017  . HTN (hypertension) 06/14/2016  . Malignant neoplasm of lower-inner quadrant of right breast of female, estrogen receptor positive ([place]) 01/07/2016  . Atherosclerosis of aorta ([place])   . Smoker     SALISBURY,DONNA 04/22/2017, 12:37 PM  [place], [place], [address] Phone: [phone]   Fax:  [phone]  Name: SUA SPADAFORA MRN: [id number] Date of Birth: 1955/12/06  Serafina Royals, PT 04/22/17 12:37 PM

## 2017-04-27 ENCOUNTER — Ambulatory Visit: Payer: 59 | Admitting: Physical Therapy

## 2017-04-27 DIAGNOSIS — R293 Abnormal posture: Secondary | ICD-10-CM

## 2017-04-27 DIAGNOSIS — M25512 Pain in left shoulder: Secondary | ICD-10-CM

## 2017-04-27 DIAGNOSIS — M25611 Stiffness of right shoulder, not elsewhere classified: Secondary | ICD-10-CM | POA: Diagnosis not present

## 2017-04-27 DIAGNOSIS — M25612 Stiffness of left shoulder, not elsewhere classified: Secondary | ICD-10-CM

## 2017-04-27 DIAGNOSIS — M6281 Muscle weakness (generalized): Secondary | ICD-10-CM

## 2017-04-27 DIAGNOSIS — M25511 Pain in right shoulder: Secondary | ICD-10-CM

## 2017-04-27 NOTE — Therapy (Signed)
[place], [place], [address] Phone: [phone]   Fax:  [phone]  Physical Therapy Treatment  Patient Details  Name: Krystal Brooks MRN: [id number] Date of Birth: 11/18/1955 Referring Provider: Marla Roe  Encounter Date: 04/27/2017    Past Medical History:  Diagnosis Date  . Adie's pupil    LEFT EYE  . Allergy   . Anxiety   . Asthma   . Atherosclerosis of aorta (HCC)    bulge above renal arteries on mri 2016  . Cancer [facility]) 2017   right breast ca  . Complication of anesthesia    VERY HARD TO WAKE UP    . Hypertension   . Personal history of radiation therapy   . PONV (postoperative nausea and vomiting)   . Smoker     Past Surgical History:  Procedure Laterality Date  . ABDOMINAL HYSTERECTOMY    . BREAST BIOPSY Right 01/02/2016  . BREAST EXCISIONAL BIOPSY Right 1979  . BREAST LUMPECTOMY Right 01/27/2016  . BREAST LUMPECTOMY WITH RADIOACTIVE SEED AND SENTINEL LYMPH NODE BIOPSY Right 01/27/2016   Procedure: BREAST LUMPECTOMY WITH RADIOACTIVE SEED AND SENTINEL LYMPH NODE BIOPSY;  Surgeon: Excell Seltzer, MD;  Location: [place];  Service: General;  Laterality: Right;  . BREAST RECONSTRUCTION WITH PLACEMENT OF TISSUE EXPANDER AND FLEX HD (ACELLULAR HYDRATED DERMIS) Bilateral 03/16/2017   Procedure: BILATERAL IMMEDIATE BREAST RECONSTRUCTION WITH PLACEMENT OF TISSUE EXPANDER AND FLEX HD (ACELLULAR HYDRATED DERMIS);  Surgeon: Wallace Going, DO;  Location: [place];  Service: Plastics;  Laterality: Bilateral;  . BREAST SURGERY  1979   cyst removed rt breast  . CATARACT EXTRACTION W/ INTRAOCULAR LENS IMPLANT     RT EYE  . CESAREAN SECTION     x2  . MASTECTOMY W/ SENTINEL NODE BIOPSY Bilateral 03/16/2017   Procedure: BILATERAL TOTAL MASTECTOMY WITH LEFT SENTINEL LYMPH NODE BIOPSY;  Surgeon: Excell Seltzer, MD;  Location: [place];  Service: General;  Laterality: Bilateral;   . TOTAL MASTECTOMY Bilateral 03/16/2017   SENTINAL NODE BIOPSY    There were no vitals filed for this visit.      Subjective Assessment - 04/27/17 0900    Subjective Pt had drainage from left breast on camisole Saturday morning and sitll has some Tuesday morning.  She went to plastic surgeon yesterday and was recommended to rest left arm for a few days.  Pt feels right side fullness is better after MLD.  Not completely gone away    Pertinent History asthma, 2017 R breast cancer s/p lumpectomy, SLNB and radiation, 2018 L breast cancer s/p bilateral mastectomy and L SLNB bilateral reconstruction 03/16/2017 and is continuing to get fills with plans for implants end of October.    Patient Stated Goals to get more ROM in my right arm   Currently in Pain? No/denies  constant aching                          [facility] Adult PT Treatment/Exercise - 04/27/17 0001      Shoulder Exercises: Supine   Other Supine Exercises Meeks decompression      Shoulder Exercises: Seated   External Rotation AROM;Strengthening;Both;5 reps  towel rolls under both elbows at waist to keep arms adducted   Theraband Level (Shoulder External Rotation) Level 1 (Yellow)   External Rotation Limitations Pt with visible motion at right back area. hold off for a few days      Manual Therapy  Myofascial Release at right axilla, lateral chest    Manual Lymphatic Drainage (MLD) deep breathing    Passive ROM to bilateral shoulder external rotation with shoulders below 90 degrees.                 PT Education - 04/27/17 0852    Education provided Yes                Long Term Clinic Goals - 04/19/17 1229      CC Long Term Goal  #1   Title Pt will be able to independently verbalize lymphedema risk reduction practices   Time 4   Period Weeks   Status New   Target Date 05/17/17     CC Long Term Goal  #2   Title Pt will demonstrate 150 degrees of bilateral shoulder flexion to allow pt to  reach items on top shelf and reach up to wash her hair   Baseline R 130, L 115   Time 4   Period Weeks   Status New   Target Date 05/17/17     CC Long Term Goal  #3   Title Pt to demonstrate 150 degrees of bilateral shoulder abduction to allow pt to reach out to sides   Baseline R 94, L 89   Time 4   Period Weeks   Status New   Target Date 05/17/17     CC Long Term Goal  #4   Title Pt to be independent in a home exercise program for continued strengthening and stretching.    Time 4   Period Weeks   Status New   Target Date 05/17/17     CC Long Term Goal  #5   Title Pt to report a 75% improvement in edema in right lateral trunk to allow improved comfort and decrease pain   Time 4   Period Weeks   Status New            Plan - 04/27/17 1253    Clinical Impression Statement Pt has precaution against aggressive stretching to left shoulder today.  Focused on postural stretching and strengthening with use of mirror for visual feedback to her for back muscle contractions  Pt reports she shoulde be able to follow through with Meeks decompression at home    Clinical Impairments Affecting Rehab Potential bilateral breast cancer, hx of radiation   PT Frequency 2x / week   PT Duration 4 weeks   PT Next Visit Plan  Cont Postural exercise and interscapular strengthening.  Continue gentle AA/A/PROM to bilateral shoulders., gentle stretching espcially to left side    Consulted and Agree with Plan of Care Patient      Patient will benefit from skilled therapeutic intervention in order to improve the following deficits and impairments:  Increased fascial restricitons, Pain, Postural dysfunction, Decreased scar mobility, Decreased range of motion, Decreased strength, Impaired UE functional use, Increased edema, Decreased knowledge of precautions  Visit Diagnosis: Stiffness of right shoulder, not elsewhere classified  Stiffness of left shoulder, not elsewhere classified  Acute pain of  right shoulder  Acute pain of left shoulder  Abnormal posture  Muscle weakness (generalized)     Problem List Patient Active Problem List   Diagnosis Date Noted  . Breast cancer ([place]) 03/16/2017  . Malignant neoplasm of upper-inner quadrant of left breast in female, estrogen receptor positive ([place]) 01/03/2017  . HTN (hypertension) 06/14/2016  . Malignant neoplasm of lower-inner quadrant of right breast of female, estrogen receptor  positive ([place]) 01/07/2016  . Atherosclerosis of aorta ([place])   . Smoker    Donato Heinz. Owens Shark PT  Norwood Levo 04/27/2017, 1:02 PM  [place], [place], [address] Phone: [phone]   Fax:  [phone]  Name: Krystal Brooks MRN: [id number] Date of Birth: Jun 08, 1956

## 2017-04-27 NOTE — Patient Instructions (Signed)

## 2017-05-09 ENCOUNTER — Encounter: Payer: Self-pay | Admitting: Physical Therapy

## 2017-05-09 ENCOUNTER — Ambulatory Visit: Payer: 59 | Attending: Plastic Surgery | Admitting: Physical Therapy

## 2017-05-09 DIAGNOSIS — M6281 Muscle weakness (generalized): Secondary | ICD-10-CM | POA: Diagnosis present

## 2017-05-09 DIAGNOSIS — M25512 Pain in left shoulder: Secondary | ICD-10-CM

## 2017-05-09 DIAGNOSIS — M25612 Stiffness of left shoulder, not elsewhere classified: Secondary | ICD-10-CM | POA: Diagnosis present

## 2017-05-09 DIAGNOSIS — M25611 Stiffness of right shoulder, not elsewhere classified: Secondary | ICD-10-CM | POA: Insufficient documentation

## 2017-05-09 DIAGNOSIS — M25511 Pain in right shoulder: Secondary | ICD-10-CM | POA: Diagnosis present

## 2017-05-09 DIAGNOSIS — R293 Abnormal posture: Secondary | ICD-10-CM | POA: Diagnosis present

## 2017-05-09 NOTE — Patient Instructions (Signed)

## 2017-05-09 NOTE — Therapy (Signed)
[place], [place], [address] Phone: [phone]   Fax:  [phone]  Physical Therapy Treatment  Patient Details  Name: Krystal Brooks MRN: [id number] Date of Birth: 10-11-1955 Referring Provider: Marla Roe  Encounter Date: 05/09/2017      PT End of Session - 05/09/17 1703    Visit Number 4   Number of Visits 8   Date for PT Re-Evaluation 05/17/17   PT Start Time 1520   PT Stop Time 1601   PT Time Calculation (min) 41 min   Activity Tolerance Patient tolerated treatment well   Behavior During Therapy [facility] for tasks assessed/performed      Past Medical History:  Diagnosis Date  . Adie's pupil    LEFT EYE  . Allergy   . Anxiety   . Asthma   . Atherosclerosis of aorta (HCC)    bulge above renal arteries on mri 2016  . Cancer [facility]) 2017   right breast ca  . Complication of anesthesia    VERY HARD TO WAKE UP    . Hypertension   . Personal history of radiation therapy   . PONV (postoperative nausea and vomiting)   . Smoker     Past Surgical History:  Procedure Laterality Date  . ABDOMINAL HYSTERECTOMY    . BREAST BIOPSY Right 01/02/2016  . BREAST EXCISIONAL BIOPSY Right 1979  . BREAST LUMPECTOMY Right 01/27/2016  . BREAST LUMPECTOMY WITH RADIOACTIVE SEED AND SENTINEL LYMPH NODE BIOPSY Right 01/27/2016   Procedure: BREAST LUMPECTOMY WITH RADIOACTIVE SEED AND SENTINEL LYMPH NODE BIOPSY;  Surgeon: Excell Seltzer, MD;  Location: [place];  Service: General;  Laterality: Right;  . BREAST RECONSTRUCTION WITH PLACEMENT OF TISSUE EXPANDER AND FLEX HD (ACELLULAR HYDRATED DERMIS) Bilateral 03/16/2017   Procedure: BILATERAL IMMEDIATE BREAST RECONSTRUCTION WITH PLACEMENT OF TISSUE EXPANDER AND FLEX HD (ACELLULAR HYDRATED DERMIS);  Surgeon: Wallace Going, DO;  Location: [place];  Service: Plastics;  Laterality: Bilateral;  . BREAST SURGERY  1979   cyst removed rt breast  .  CATARACT EXTRACTION W/ INTRAOCULAR LENS IMPLANT     RT EYE  . CESAREAN SECTION     x2  . MASTECTOMY W/ SENTINEL NODE BIOPSY Bilateral 03/16/2017   Procedure: BILATERAL TOTAL MASTECTOMY WITH LEFT SENTINEL LYMPH NODE BIOPSY;  Surgeon: Excell Seltzer, MD;  Location: [place];  Service: General;  Laterality: Bilateral;  . TOTAL MASTECTOMY Bilateral 03/16/2017   SENTINAL NODE BIOPSY    There were no vitals filed for this visit.      Subjective Assessment - 05/09/17 1522    Subjective My shoulder is feeling better. The right side is much better. The lymph massage really helped. The left side we stretched a lot and over the weekend I was bleeding from my incision today. My doctor says not to stretch quite as strenuously.    Pertinent History asthma, 2017 R breast cancer s/p lumpectomy, SLNB and radiation, 2018 L breast cancer s/p bilateral mastectomy and L SLNB bilateral reconstruction 03/16/2017 and is continuing to get fills with plans for implants end of October.    Patient Stated Goals to get more ROM in my right arm   Currently in Pain? Yes   Pain Score 4    Pain Location Breast   Pain Orientation Right;Left   Pain Descriptors / Indicators Tightness   Pain Type Surgical pain            [facility] PT Assessment - 05/09/17 0001  AROM   Right Shoulder Flexion 146 Degrees   Right Shoulder ABduction 148 Degrees   Left Shoulder Flexion 128 Degrees   Left Shoulder ABduction 94 Degrees                     [facility] Adult PT Treatment/Exercise - 05/09/17 0001      Shoulder Exercises: Supine   Horizontal ABduction Strengthening;Both;10 reps;Theraband   Theraband Level (Shoulder Horizontal ABduction) Level 1 (Yellow)   External Rotation Strengthening;Both;10 reps;Theraband   Theraband Level (Shoulder External Rotation) Level 1 (Yellow)   Flexion Strengthening;Both;10 reps;Theraband  narrow and wide grip   Theraband Level (Shoulder Flexion) Level 1 (Yellow)   Other Supine  Exercises D2 with yellow theraband x 10 bilaterally  had to keep left elbow bent to avoid pulling incision     Manual Therapy   Myofascial Release at right axilla, lateral chest    Manual Lymphatic Drainage (MLD) deep breathing, right inguinal nodes and right axillo-inguinal anastomosis   Passive ROM To right shoulder into flexion, abduction and er to pts tolerance                        Long Term Clinic Goals - 05/09/17 1603      CC Long Term Goal  #1   Title Pt will be able to independently verbalize lymphedema risk reduction practices   Time 4   Period Weeks   Status On-going     CC Long Term Goal  #2   Title Pt will demonstrate 150 degrees of bilateral shoulder flexion to allow pt to reach items on top shelf and reach up to wash her hair   Baseline R 130, L 115, 05/09/17- R 146, L 128   Time 4   Period Weeks   Status On-going     CC Long Term Goal  #3   Title Pt to demonstrate 150 degrees of bilateral shoulder abduction to allow pt to reach out to sides   Baseline R 94, L 89, 05/09/17- R 148, L 94   Time 4   Period Weeks   Status On-going     CC Long Term Goal  #4   Title Pt to be independent in a home exercise program for continued strengthening and stretching.    Time 4   Period Weeks   Status On-going     CC Long Term Goal  #5   Title Pt to report a 75% improvement in edema in right lateral trunk to allow improved comfort and decrease pain   Time 4   Period Weeks   Status On-going            Plan - 05/09/17 1704    Clinical Impression Statement Instructed pt in supine scapular exercises today with yellow band and added these to pt's home exercise program. Performed PROM to pt's right shoulder and performed myofascial release to areas of tightness in R pec and axilla. Continued with MLD to R trunk because pt felt great relief from this last session. Did not do PROM on left shoulder to allow wound to heal.    Rehab Potential Good   Clinical  Impairments Affecting Rehab Potential bilateral breast cancer, hx of radiation   PT Frequency 2x / week   PT Duration 4 weeks   PT Treatment/Interventions ADLs/Self Care Home Management;Therapeutic exercise;Patient/family education;Manual techniques;Manual lymph drainage;Scar mobilization;Passive range of motion;Taping   PT Next Visit Plan  Cont Postural exercise and interscapular strengthening.  Continue gentle AA/A/PROM to bilateral shoulders., gentle stretching espcially to left side if wound is healed   PT Home Exercise Plan supine dowel exercises, supine scap   Consulted and Agree with Plan of Care Patient      Patient will benefit from skilled therapeutic intervention in order to improve the following deficits and impairments:  Increased fascial restricitons, Pain, Postural dysfunction, Decreased scar mobility, Decreased range of motion, Decreased strength, Impaired UE functional use, Increased edema, Decreased knowledge of precautions  Visit Diagnosis: Stiffness of right shoulder, not elsewhere classified  Stiffness of left shoulder, not elsewhere classified  Acute pain of right shoulder  Acute pain of left shoulder  Abnormal posture  Muscle weakness (generalized)     Problem List Patient Active Problem List   Diagnosis Date Noted  . Breast cancer ([place]) 03/16/2017  . Malignant neoplasm of upper-inner quadrant of left breast in female, estrogen receptor positive ([place]) 01/03/2017  . HTN (hypertension) 06/14/2016  . Malignant neoplasm of lower-inner quadrant of right breast of female, estrogen receptor positive ([place]) 01/07/2016  . Atherosclerosis of aorta ([place])   . Smoker     Allyson Sabal [facility] 05/09/2017, 5:07 PM  [place], [place], [address] Phone: [phone]   Fax:  [phone]  Name: Krystal Brooks MRN: [id number] Date of Birth: March 21, 1956  Manus Gunning, PT 05/09/17  5:07 PM

## 2017-05-10 ENCOUNTER — Encounter: Payer: Self-pay | Admitting: Family Medicine

## 2017-05-10 ENCOUNTER — Ambulatory Visit (INDEPENDENT_AMBULATORY_CARE_PROVIDER_SITE_OTHER): Payer: 59 | Admitting: Family Medicine

## 2017-05-10 VITALS — HR 112 | Temp 97.7°F | Resp 14 | Ht 61.0 in | Wt 126.0 lb

## 2017-05-10 DIAGNOSIS — M7989 Other specified soft tissue disorders: Secondary | ICD-10-CM

## 2017-05-10 DIAGNOSIS — I10 Essential (primary) hypertension: Secondary | ICD-10-CM | POA: Diagnosis not present

## 2017-05-10 DIAGNOSIS — E876 Hypokalemia: Secondary | ICD-10-CM | POA: Diagnosis not present

## 2017-05-10 DIAGNOSIS — E871 Hypo-osmolality and hyponatremia: Secondary | ICD-10-CM

## 2017-05-10 MED ORDER — TRIAMTERENE-HCTZ 37.5-25 MG PO TABS: 1.0000 | ORAL_TABLET | Freq: Every day | ORAL | 3 refills | Status: DC

## 2017-05-10 NOTE — Progress Notes (Signed)
Patient refused in office BP check D/T B lymphedema from B mastectomy. Was seen at plastic surgeon this AM and BP noted at 134/79 with HR 92.

## 2017-05-10 NOTE — Progress Notes (Signed)
Subjective:    Patient ID: Krystal Brooks, female    DOB: 1956-05-04, 61 y.o.   MRN:  [id number]  Last OV Patient has breast cancer and is scheduled for surgery this month for treatment and reconstruction. Recently. Lab work obtained at the surgical center and with the anesthesiologist, she has been found to have hyponatremia. Her most recent sodium level was 126. Review of her chart over the last 2 years reveals an average serum sodium between 133 and 134 however it is recently worsened. Also her potassium is low at 3.2. Of note she is on losartan/hydrochlorothiazide. She also reports polydipsia and has been sipping on drinks constantly throughout the day. She denies any headache cough or shortness of breath. She denies any seizures or dizziness or ataxia.  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 I have recommended discontinuation of losartan/hydrochlorothiazide for this reason. I believe this will improve the serum sodium level as well as the serum potassium level. I have also recommended restriction of fluid intake to 1200 mL per day. We will reassess her serum sodium level next week on Tuesday to ensure improvement. I believe the hypokalemia is definitely due to hydrochlorothiazide. If persistent, evaluate for adrenal  insufficiency. However, her blood pressure is not well controlled. Therefore I will have the patient start losartan 100 mg by mouth  daily as well as amlodipine 10 mg by mouth daily and recheck her blood pressure next week.  patient is requesting medication for anxiety and sleep regarding her upcoming surgery. They recently gave her prescription for Valium 2 mg every 8 hours as needed. I recommended that she try this medication first to see if this will help with anxiety and sleep.  03/08/17 Patient is here today for follow-up. She's been eating a diet rich in potassium. She discontinued hydrochlorothiazide. She is also been trying to restrict her fluid intake to less than 1200 mL a day. Her blood pressure today is borderline controlled on the combination of amlodipine and losartan. She denies any significant side effects from amlodipine. She does not have significant leg swelling.  AT that time, my plan was: Blood pressures acceptable. Recheck a BMP today. If the patient's sodium and potassium have normalized, no further workup is necessary and the patient is cleared to proceed with surgery. If the sodium level remains critically low, I would next evaluate the patient for hypothyroidism as well as adrenal insufficiency.  05/10/17 Patient has subsequently undergone bilateral mastectomies with reconstruction. Her blood pressure has been well controlled. Her hyponatremia improved with fluid restriction and discontinuation of hydrochlorothiazide as did her hypokalemia. However recently, the patient has become more active. She is on her feet more often. As a result, she is noticing increasing amounts of leg swelling from the mid shin to her ankles. Left is worse than right.  She denies any calf pain. There is no erythema. She has a negative Homans sign. Swelling fluctuates. It is worse at the end of the day after she's been on her feet. It is better first thing in the morning. It appears to be secondary to  amlodipine and venous insufficiency. Past Medical History:  Diagnosis Date  . Adie's pupil    LEFT EYE  . Allergy   . Anxiety   . Asthma   . Atherosclerosis of aorta (HCC)    bulge above renal arteries on mri 2016  . Cancer [facility]) 2017   right breast ca  . Complication of anesthesia    VERY HARD TO WAKE UP    .  Hypertension   . Personal history of radiation therapy   . PONV (postoperative nausea and vomiting)   . Smoker    Past Surgical History:  Procedure Laterality Date  . ABDOMINAL HYSTERECTOMY    . BREAST BIOPSY Right 01/02/2016  . BREAST EXCISIONAL BIOPSY Right 1979  . BREAST LUMPECTOMY Right 01/27/2016  . BREAST LUMPECTOMY WITH RADIOACTIVE SEED AND SENTINEL LYMPH NODE BIOPSY Right 01/27/2016   Procedure: BREAST LUMPECTOMY WITH RADIOACTIVE SEED AND SENTINEL LYMPH NODE BIOPSY;  Surgeon: Excell Seltzer, MD;  Location: [place];  Service: General;  Laterality: Right;  . BREAST RECONSTRUCTION WITH PLACEMENT OF TISSUE EXPANDER AND FLEX HD (ACELLULAR HYDRATED DERMIS) Bilateral 03/16/2017   Procedure: BILATERAL IMMEDIATE BREAST RECONSTRUCTION WITH PLACEMENT OF TISSUE EXPANDER AND FLEX HD (ACELLULAR HYDRATED DERMIS);  Surgeon: Wallace Going, DO;  Location: [place];  Service: Plastics;  Laterality: Bilateral;  . BREAST SURGERY  1979   cyst removed rt breast  . CATARACT EXTRACTION W/ INTRAOCULAR LENS IMPLANT     RT EYE  . CESAREAN SECTION     x2  . MASTECTOMY W/ SENTINEL NODE BIOPSY Bilateral 03/16/2017   Procedure: BILATERAL TOTAL MASTECTOMY WITH LEFT SENTINEL LYMPH NODE BIOPSY;  Surgeon: Excell Seltzer, MD;  Location: [place];  Service: General;  Laterality: Bilateral;  . TOTAL MASTECTOMY Bilateral 03/16/2017   SENTINAL NODE BIOPSY   Current Outpatient Prescriptions on File Prior to Visit  Medication Sig Dispense Refill  . acetaminophen (TYLENOL) 500 MG tablet Take 1,500 mg by mouth daily as needed for moderate pain or headache.    Marland Kitchen amLODipine (NORVASC)  10 MG tablet Take 1 tablet (10 mg total) by mouth daily. 90 tablet 3  . ANORO ELLIPTA 62.5-25 MCG/INH AEPB INHALE 1 PUFF INTO THE LUNGS DAILY (Patient taking differently: Inhale 1 puff into the lungs every evening. ) 60 each 10  . diazepam (VALIUM) 2 MG tablet Take 2 mg by mouth every 6 (six) hours as needed for anxiety.    Marland Kitchen ibuprofen (ADVIL,MOTRIN) 200 MG tablet Take 600 mg by mouth daily as needed for moderate pain.    Marland Kitchen losartan (COZAAR) 100 MG tablet Take 1 tablet (100 mg total) by mouth daily. 90 tablet 3  . montelukast (SINGULAIR) 10 MG tablet TAKE 1 TABLET BY MOUTH EVERY DAY (Patient taking differently: TAKE 1 TABLET BY MOUTH EVERY DAY AT BEDTIME) 90 tablet 3  . Multiple Vitamin (MULTIVITAMIN WITH MINERALS) TABS tablet Take 1 tablet by mouth daily.    . VENTOLIN HFA 108 (90 Base) MCG/ACT inhaler USE 1-2 PUFFS EVERY 6 HOURS AS NEEDED FOR WHEEZING 18 Inhaler 3   No current facility-administered medications on file prior to visit.    Allergies  Allergen Reactions  . Penicillins Swelling and Rash    Older sister died from reaction to penicillin Has patient had a PCN reaction causing immediate rash, facial/tongue/throat swelling, SOB or lightheadedness with hypotension: Yes Has patient had a PCN reaction causing severe rash involving mucus membranes or skin necrosis: No Has patient had a PCN reaction that required hospitalization: No Has patient had a PCN reaction occurring within the last 10 years: No If all of the above answers are "NO", then may proceed with Cephalosporin use.   . Sulfa Antibiotics Rash   Social History   Social History  . Marital status: Married    Spouse name: N/A  . Number of children: N/A  . Years of education: N/A   Occupational History  . Not on file.  Social History Main Topics  . Smoking status: Former Smoker    Packs/day: 0.50    Years: 40.00    Types: Cigarettes  . Smokeless tobacco: Never Used  . Alcohol use 0.0 oz/week     Comment: social   . Drug use: No  . Sexual activity: Yes    Birth control/ protection: Surgical     Comment: hysterectomy   Other Topics Concern  . Not on file   Social History Narrative  . No narrative on file     Review of Systems  All other systems reviewed and are negative.      Objective:   Physical Exam  Cardiovascular: Normal rate, regular rhythm and normal heart sounds.   Pulmonary/Chest: Effort normal and breath sounds normal. No respiratory distress. She has no wheezes. She has no rales.  Vitals reviewed.         Assessment & Plan:  Hyponatremia  Hypokalemia  Benign essential HTN  Leg swelling  Discontinue amlodipine. Replace with Maxide 37.5/25 one by mouth daily area hopefully the combination of hydrochlorothiazide with triamterene will prevent hypokalemia. We will need to monitor her sodium level. Therefore I'll obtain a baseline BMP today. Recheck BMP in 3 weeks on the medication. If hyponatremia returns, discontinue Maxide and replace with bystolic.

## 2017-05-11 ENCOUNTER — Ambulatory Visit: Payer: 59 | Admitting: Physical Therapy

## 2017-05-11 DIAGNOSIS — R293 Abnormal posture: Secondary | ICD-10-CM

## 2017-05-11 DIAGNOSIS — M25612 Stiffness of left shoulder, not elsewhere classified: Secondary | ICD-10-CM

## 2017-05-11 DIAGNOSIS — M25512 Pain in left shoulder: Secondary | ICD-10-CM

## 2017-05-11 DIAGNOSIS — M25611 Stiffness of right shoulder, not elsewhere classified: Secondary | ICD-10-CM | POA: Diagnosis not present

## 2017-05-11 DIAGNOSIS — M6281 Muscle weakness (generalized): Secondary | ICD-10-CM

## 2017-05-11 LAB — BASIC METABOLIC PANEL WITH GFR
BUN: 13 mg/dL (ref 7–25)
CALCIUM: 9.4 mg/dL (ref 8.6–10.4)
CO2: 30 mmol/L (ref 20–32)
CREATININE: 0.75 mg/dL (ref 0.50–0.99)
Chloride: 99 mmol/L (ref 98–110)
GFR, EST NON AFRICAN AMERICAN: 85 mL/min/{1.73_m2} (ref >=60)
GFR, Est African American: 99 mL/min/{1.73_m2} (ref >=60)
Glucose, Bld: 97 mg/dL (ref 65–99)
Potassium: 3.8 mmol/L (ref 3.5–5.3)
SODIUM: 136 mmol/L (ref 135–146)

## 2017-05-11 NOTE — Therapy (Signed)
[place], [place], [address] Phone: [phone]   Fax:  [phone]  Physical Therapy Treatment  Patient Details  Name: Krystal Brooks MRN: [id number] Date of Birth: 15-Nov-1955 Referring Provider: Marla Roe  Encounter Date: 05/11/2017      PT End of Session - 05/11/17 1159    Visit Number 5   Number of Visits 8   Date for PT Re-Evaluation 05/17/17   PT Start Time 1105   PT Stop Time 1148   PT Time Calculation (min) 43 min   Activity Tolerance Patient tolerated treatment well   Behavior During Therapy [facility] for tasks assessed/performed      Past Medical History:  Diagnosis Date  . Adie's pupil    LEFT EYE  . Allergy   . Anxiety   . Asthma   . Atherosclerosis of aorta (HCC)    bulge above renal arteries on mri 2016  . Cancer [facility]) 2017   right breast ca  . Complication of anesthesia    VERY HARD TO WAKE UP    . Hypertension   . Personal history of radiation therapy   . PONV (postoperative nausea and vomiting)   . Smoker     Past Surgical History:  Procedure Laterality Date  . ABDOMINAL HYSTERECTOMY    . BREAST BIOPSY Right 01/02/2016  . BREAST EXCISIONAL BIOPSY Right 1979  . BREAST LUMPECTOMY Right 01/27/2016  . BREAST LUMPECTOMY WITH RADIOACTIVE SEED AND SENTINEL LYMPH NODE BIOPSY Right 01/27/2016   Procedure: BREAST LUMPECTOMY WITH RADIOACTIVE SEED AND SENTINEL LYMPH NODE BIOPSY;  Surgeon: Excell Seltzer, MD;  Location: [place];  Service: General;  Laterality: Right;  . BREAST RECONSTRUCTION WITH PLACEMENT OF TISSUE EXPANDER AND FLEX HD (ACELLULAR HYDRATED DERMIS) Bilateral 03/16/2017   Procedure: BILATERAL IMMEDIATE BREAST RECONSTRUCTION WITH PLACEMENT OF TISSUE EXPANDER AND FLEX HD (ACELLULAR HYDRATED DERMIS);  Surgeon: Wallace Going, DO;  Location: [place];  Service: Plastics;  Laterality: Bilateral;  . BREAST SURGERY  1979   cyst removed rt breast  .  CATARACT EXTRACTION W/ INTRAOCULAR LENS IMPLANT     RT EYE  . CESAREAN SECTION     x2  . MASTECTOMY W/ SENTINEL NODE BIOPSY Bilateral 03/16/2017   Procedure: BILATERAL TOTAL MASTECTOMY WITH LEFT SENTINEL LYMPH NODE BIOPSY;  Surgeon: Excell Seltzer, MD;  Location: [place];  Service: General;  Laterality: Bilateral;  . TOTAL MASTECTOMY Bilateral 03/16/2017   SENTINAL NODE BIOPSY    There were no vitals filed for this visit.      Subjective Assessment - 05/11/17 1116    Subjective Pt is still having some drainage from the left side so stretching is limited. Still wants to work on opening up her chest more.    Pertinent History asthma, 2017 R breast cancer s/p lumpectomy, SLNB and radiation, 2018 L breast cancer s/p bilateral mastectomy and L SLNB bilateral reconstruction 03/16/2017 and is continuing to get fills with plans for implants end of October.    Patient Stated Goals to get more ROM in my right arm   Currently in Pain? No/denies                         [facility] Adult PT Treatment/Exercise - 05/11/17 0001      Lumbar Exercises: Supine   Other Supine Lumbar Exercises lower trunk rotation with goal post arm on right and arm at side on left      Shoulder Exercises:  Sidelying   External Rotation Strengthening;Right;Left;10 reps   External Rotation Weight (lbs) 2   Other Sidelying Exercises scapular retraction and depression x 3 reps      Shoulder Exercises: Standing   Row Strengthening;Left;10 reps;Theraband  low row    Theraband Level (Shoulder Row) Level 2 (Red)   Row Limitations cues to keep elbows straight and retract scapular during exercise      Shoulder Exercises: ROM/Strengthening   "W" Arms in standing working toward 30 second holds      Manual Therapy   Myofascial Release to right pec major    Passive ROM Passive stretching to bilateral shoulder retraction in supine for pec major and minor stretch                         Long Term  Clinic Goals - 05/09/17 1603      CC Long Term Goal  #1   Title Pt will be able to independently verbalize lymphedema risk reduction practices   Time 4   Period Weeks   Status On-going     CC Long Term Goal  #2   Title Pt will demonstrate 150 degrees of bilateral shoulder flexion to allow pt to reach items on top shelf and reach up to wash her hair   Baseline R 130, L 115, 05/09/17- R 146, L 128   Time 4   Period Weeks   Status On-going     CC Long Term Goal  #3   Title Pt to demonstrate 150 degrees of bilateral shoulder abduction to allow pt to reach out to sides   Baseline R 94, L 89, 05/09/17- R 148, L 94   Time 4   Period Weeks   Status On-going     CC Long Term Goal  #4   Title Pt to be independent in a home exercise program for continued strengthening and stretching.    Time 4   Period Weeks   Status On-going     CC Long Term Goal  #5   Title Pt to report a 75% improvement in edema in right lateral trunk to allow improved comfort and decrease pain   Time 4   Period Weeks   Status On-going            Plan - 05/11/17 1159    Clinical Impression Statement Pt feels that she has made improvement and her chest is open more, but she wants to continue.  Performed manual stretching and interscapular stengthening today with upgrade to home program.    Rehab Potential Good   Clinical Impairments Affecting Rehab Potential bilateral breast cancer, hx of radiation   PT Frequency 2x / week   PT Duration 4 weeks   PT Next Visit Plan  Cont Postural exercise and interscapular strengthening. with review of home program, supine scap series, low rows  with scap retraction and "W" stretch  Continue gentle AA/A/PROM to bilateral shoulders., gentle stretching espcially to left side if wound is healed   Consulted and Agree with Plan of Care Patient      Patient will benefit from skilled therapeutic intervention in order to improve the following deficits and impairments:  Increased  fascial restricitons, Pain, Postural dysfunction, Decreased scar mobility, Decreased range of motion, Decreased strength, Impaired UE functional use, Increased edema, Decreased knowledge of precautions  Visit Diagnosis: Stiffness of right shoulder, not elsewhere classified  Stiffness of left shoulder, not elsewhere classified  Acute pain of left  shoulder  Abnormal posture  Muscle weakness (generalized)     Problem List Patient Active Problem List   Diagnosis Date Noted  . Breast cancer ([place]) 03/16/2017  . Malignant neoplasm of upper-inner quadrant of left breast in female, estrogen receptor positive ([place]) 01/03/2017  . HTN (hypertension) 06/14/2016  . Malignant neoplasm of lower-inner quadrant of right breast of female, estrogen receptor positive ([place]) 01/07/2016  . Atherosclerosis of aorta ([place])   . Smoker    Donato Heinz. Owens Shark PT  Norwood Levo 05/11/2017, 12:04 PM  [place], [place], [address] Phone: [phone]   Fax:  [phone]  Name: Krystal Brooks MRN: [id number] Date of Birth: 12/24/1955

## 2017-05-16 ENCOUNTER — Encounter: Payer: Self-pay | Admitting: Physical Therapy

## 2017-05-16 ENCOUNTER — Ambulatory Visit: Payer: 59 | Admitting: Physical Therapy

## 2017-05-16 DIAGNOSIS — M25611 Stiffness of right shoulder, not elsewhere classified: Secondary | ICD-10-CM

## 2017-05-16 DIAGNOSIS — M25511 Pain in right shoulder: Secondary | ICD-10-CM

## 2017-05-16 DIAGNOSIS — M25612 Stiffness of left shoulder, not elsewhere classified: Secondary | ICD-10-CM

## 2017-05-16 DIAGNOSIS — R293 Abnormal posture: Secondary | ICD-10-CM

## 2017-05-16 DIAGNOSIS — M25512 Pain in left shoulder: Secondary | ICD-10-CM

## 2017-05-16 DIAGNOSIS — M6281 Muscle weakness (generalized): Secondary | ICD-10-CM

## 2017-05-16 NOTE — Therapy (Signed)
[place], [place], [address] Phone: [phone]   Fax:  [phone]  Physical Therapy Treatment  Patient Details  Name: Krystal Brooks MRN: [id number] Date of Birth: September 29, 1955 Referring Provider: Marla Roe  Encounter Date: 05/16/2017      PT End of Session - 05/16/17 1545    Visit Number 6   Number of Visits 8   Date for PT Re-Evaluation 05/17/17   PT Start Time 1430   PT Stop Time 1515   PT Time Calculation (min) 45 min   Activity Tolerance Patient tolerated treatment well   Behavior During Therapy [facility] for tasks assessed/performed      Past Medical History:  Diagnosis Date  . Adie's pupil    LEFT EYE  . Allergy   . Anxiety   . Asthma   . Atherosclerosis of aorta (HCC)    bulge above renal arteries on mri 2016  . Cancer [facility]) 2017   right breast ca  . Complication of anesthesia    VERY HARD TO WAKE UP    . Hypertension   . Personal history of radiation therapy   . PONV (postoperative nausea and vomiting)   . Smoker     Past Surgical History:  Procedure Laterality Date  . ABDOMINAL HYSTERECTOMY    . BREAST BIOPSY Right 01/02/2016  . BREAST EXCISIONAL BIOPSY Right 1979  . BREAST LUMPECTOMY Right 01/27/2016  . BREAST LUMPECTOMY WITH RADIOACTIVE SEED AND SENTINEL LYMPH NODE BIOPSY Right 01/27/2016   Procedure: BREAST LUMPECTOMY WITH RADIOACTIVE SEED AND SENTINEL LYMPH NODE BIOPSY;  Surgeon: Excell Seltzer, MD;  Location: [place];  Service: General;  Laterality: Right;  . BREAST RECONSTRUCTION WITH PLACEMENT OF TISSUE EXPANDER AND FLEX HD (ACELLULAR HYDRATED DERMIS) Bilateral 03/16/2017   Procedure: BILATERAL IMMEDIATE BREAST RECONSTRUCTION WITH PLACEMENT OF TISSUE EXPANDER AND FLEX HD (ACELLULAR HYDRATED DERMIS);  Surgeon: Wallace Going, DO;  Location: [place];  Service: Plastics;  Laterality: Bilateral;  . BREAST SURGERY  1979   cyst removed rt breast  .  CATARACT EXTRACTION W/ INTRAOCULAR LENS IMPLANT     RT EYE  . CESAREAN SECTION     x2  . MASTECTOMY W/ SENTINEL NODE BIOPSY Bilateral 03/16/2017   Procedure: BILATERAL TOTAL MASTECTOMY WITH LEFT SENTINEL LYMPH NODE BIOPSY;  Surgeon: Excell Seltzer, MD;  Location: [place];  Service: General;  Laterality: Bilateral;  . TOTAL MASTECTOMY Bilateral 03/16/2017   SENTINAL NODE BIOPSY    There were no vitals filed for this visit.      Subjective Assessment - 05/16/17 1431    Subjective I am feeling good. The spot is getting there. There is only very slight drainage. I saw the doctor and she told me to keep putting antibiotic ointment on it.    Pertinent History asthma, 2017 R breast cancer s/p lumpectomy, SLNB and radiation, 2018 L breast cancer s/p bilateral mastectomy and L SLNB bilateral reconstruction 03/16/2017 and is continuing to get fills with plans for implants end of October.    Patient Stated Goals to get more ROM in my right arm   Currently in Pain? No/denies   Pain Score 0-No pain                         [facility] Adult PT Treatment/Exercise - 05/16/17 0001      Lumbar Exercises: Supine   Other Supine Lumbar Exercises lower trunk rotation with goal post arm on right and  arm at side on left      Shoulder Exercises: Supine   Horizontal ABduction Strengthening;Both;10 reps;Theraband   Theraband Level (Shoulder Horizontal ABduction) Level 1 (Yellow)   External Rotation Strengthening;Both;10 reps;Theraband   Theraband Level (Shoulder External Rotation) Level 1 (Yellow)   Flexion Strengthening;Both;10 reps;Theraband  narrow and wide grip   Theraband Level (Shoulder Flexion) Level 1 (Yellow)   Other Supine Exercises D2 with yellow theraband x 10 bilaterally  had to keep left elbow bent to avoid pulling incision     Shoulder Exercises: Sidelying   External Rotation Strengthening;Right;Left;10 reps   External Rotation Weight (lbs) 2   Other Sidelying Exercises  scapular retraction and depression x 10 reps      Shoulder Exercises: Standing   Row Strengthening;Left;10 reps;Theraband  low row    Theraband Level (Shoulder Row) Level 2 (Red)     Shoulder Exercises: ROM/Strengthening   "W" Arms in standing working toward 30 second holds x 3     Manual Therapy   Myofascial Release to right pec major    Passive ROM To right shoulder into flexion, abduction and er to pts tolerance                        Long Term Clinic Goals - 05/09/17 1603      CC Long Term Goal  #1   Title Pt will be able to independently verbalize lymphedema risk reduction practices   Time 4   Period Weeks   Status On-going     CC Long Term Goal  #2   Title Pt will demonstrate 150 degrees of bilateral shoulder flexion to allow pt to reach items on top shelf and reach up to wash her hair   Baseline R 130, L 115, 05/09/17- R 146, L 128   Time 4   Period Weeks   Status On-going     CC Long Term Goal  #3   Title Pt to demonstrate 150 degrees of bilateral shoulder abduction to allow pt to reach out to sides   Baseline R 94, L 89, 05/09/17- R 148, L 94   Time 4   Period Weeks   Status On-going     CC Long Term Goal  #4   Title Pt to be independent in a home exercise program for continued strengthening and stretching.    Time 4   Period Weeks   Status On-going     CC Long Term Goal  #5   Title Pt to report a 75% improvement in edema in right lateral trunk to allow improved comfort and decrease pain   Time 4   Period Weeks   Status On-going            Plan - 05/16/17 1547    Clinical Impression Statement Reeducated pt in strengthening exercises today. Pt is feeling more comfortable with exercises. She is still having increased tightness in right pec but following myofascial release and PROM her PROM improved in direction of abduction. She plans on having her implants placed in mid October.    Rehab Potential Good   Clinical Impairments Affecting  Rehab Potential bilateral breast cancer, hx of radiation   PT Frequency 2x / week   PT Duration 4 weeks   PT Treatment/Interventions ADLs/Self Care Home Management;Therapeutic exercise;Patient/family education;Manual techniques;Manual lymph drainage;Scar mobilization;Passive range of motion;Taping   PT Next Visit Plan  Cont Postural exercise and interscapular strengthening. Continue gentle AA/A/PROM to bilateral shoulders., gentle stretching  espcially to left side if wound is healed   PT Home Exercise Plan supine dowel exercises, supine scap   Consulted and Agree with Plan of Care Patient      Patient will benefit from skilled therapeutic intervention in order to improve the following deficits and impairments:  Increased fascial restricitons, Pain, Postural dysfunction, Decreased scar mobility, Decreased range of motion, Decreased strength, Impaired UE functional use, Increased edema, Decreased knowledge of precautions  Visit Diagnosis: Stiffness of right shoulder, not elsewhere classified  Stiffness of left shoulder, not elsewhere classified  Acute pain of left shoulder  Abnormal posture  Muscle weakness (generalized)  Acute pain of right shoulder     Problem List Patient Active Problem List   Diagnosis Date Noted  . Breast cancer ([place]) 03/16/2017  . Malignant neoplasm of upper-inner quadrant of left breast in female, estrogen receptor positive ([place]) 01/03/2017  . HTN (hypertension) 06/14/2016  . Malignant neoplasm of lower-inner quadrant of right breast of female, estrogen receptor positive ([place]) 01/07/2016  . Atherosclerosis of aorta ([place])   . Smoker     Allyson Sabal [facility] 05/16/2017, 3:49 PM  [place], [place], [address] Phone: [phone]   Fax:  [phone]  Name: Krystal Brooks MRN: [id number] Date of Birth: [birth year]  Manus Gunning, PT 05/16/17 3:49 PM

## 2017-05-18 ENCOUNTER — Ambulatory Visit: Payer: 59 | Admitting: Physical Therapy

## 2017-05-18 DIAGNOSIS — M25512 Pain in left shoulder: Secondary | ICD-10-CM

## 2017-05-18 DIAGNOSIS — M25511 Pain in right shoulder: Secondary | ICD-10-CM

## 2017-05-18 DIAGNOSIS — R293 Abnormal posture: Secondary | ICD-10-CM

## 2017-05-18 DIAGNOSIS — M25611 Stiffness of right shoulder, not elsewhere classified: Secondary | ICD-10-CM | POA: Diagnosis not present

## 2017-05-18 DIAGNOSIS — M6281 Muscle weakness (generalized): Secondary | ICD-10-CM

## 2017-05-18 DIAGNOSIS — M25612 Stiffness of left shoulder, not elsewhere classified: Secondary | ICD-10-CM

## 2017-05-18 NOTE — Therapy (Signed)
[place], [place], [address] Phone: [phone]   Fax:  [phone]  Physical Therapy Treatment  Patient Details  Name: Krystal Brooks MRN: [id number] Date of Birth: 19-Apr-1956 Referring Provider: Marla Roe  Encounter Date: 05/18/2017      PT End of Session - 05/18/17 1655    Visit Number 7   Number of Visits 16   Date for PT Re-Evaluation 06/15/17   PT Start Time 7209   PT Stop Time 1346   PT Time Calculation (min) 44 min   Activity Tolerance Patient tolerated treatment well   Behavior During Therapy [facility] for tasks assessed/performed      Past Medical History:  Diagnosis Date  . Adie's pupil    LEFT EYE  . Allergy   . Anxiety   . Asthma   . Atherosclerosis of aorta (HCC)    bulge above renal arteries on mri 2016  . Cancer [facility]) 2017   right breast ca  . Complication of anesthesia    VERY HARD TO WAKE UP    . Hypertension   . Personal history of radiation therapy   . PONV (postoperative nausea and vomiting)   . Smoker     Past Surgical History:  Procedure Laterality Date  . ABDOMINAL HYSTERECTOMY    . BREAST BIOPSY Right 01/02/2016  . BREAST EXCISIONAL BIOPSY Right 1979  . BREAST LUMPECTOMY Right 01/27/2016  . BREAST LUMPECTOMY WITH RADIOACTIVE SEED AND SENTINEL LYMPH NODE BIOPSY Right 01/27/2016   Procedure: BREAST LUMPECTOMY WITH RADIOACTIVE SEED AND SENTINEL LYMPH NODE BIOPSY;  Surgeon: Excell Seltzer, MD;  Location: [place];  Service: General;  Laterality: Right;  . BREAST RECONSTRUCTION WITH PLACEMENT OF TISSUE EXPANDER AND FLEX HD (ACELLULAR HYDRATED DERMIS) Bilateral 03/16/2017   Procedure: BILATERAL IMMEDIATE BREAST RECONSTRUCTION WITH PLACEMENT OF TISSUE EXPANDER AND FLEX HD (ACELLULAR HYDRATED DERMIS);  Surgeon: Wallace Going, DO;  Location: [place];  Service: Plastics;  Laterality: Bilateral;  . BREAST SURGERY  1979   cyst removed rt breast  .  CATARACT EXTRACTION W/ INTRAOCULAR LENS IMPLANT     RT EYE  . CESAREAN SECTION     x2  . MASTECTOMY W/ SENTINEL NODE BIOPSY Bilateral 03/16/2017   Procedure: BILATERAL TOTAL MASTECTOMY WITH LEFT SENTINEL LYMPH NODE BIOPSY;  Surgeon: Excell Seltzer, MD;  Location: [place];  Service: General;  Laterality: Bilateral;  . TOTAL MASTECTOMY Bilateral 03/16/2017   SENTINAL NODE BIOPSY    There were no vitals filed for this visit.      Subjective Assessment - 05/18/17 1305    Subjective "I was really sore Monday night and Tuesday. Yesterday afternoon I thought, 'I don't think I can go tomorrow.'" She thinks it was the whole of the treatment she did last session. Still just a little open spot on the left side.   Currently in Pain? Yes   Pain Score 4    Pain Location Axilla  and left upper arm posteriorly   Pain Orientation Right   Pain Descriptors / Indicators Discomfort;Sore   Aggravating Factors  exercise last time   Pain Relieving Factors nothing                         [facility] Adult PT Treatment/Exercise - 05/18/17 0001      Shoulder Exercises: Supine   Horizontal ABduction Strengthening;Both;5 reps;Theraband   Theraband Level (Shoulder Horizontal ABduction) Level 1 (Yellow)   External Rotation Strengthening;Both;5 reps;Theraband  Theraband Level (Shoulder External Rotation) Level 1 (Yellow)   Flexion Strengthening;Both;Theraband;5 reps  narrow and wide grip   Theraband Level (Shoulder Flexion) Level 1 (Yellow)   Other Supine Exercises D2 with yellow theraband x 10 bilaterally  had to keep left elbow bent to avoid pulling incision     Manual Therapy   Manual Therapy Scapular mobilization   Myofascial Release Rt. UE myofascial pulling; unilateral release at right pect insertion area, then crosshands from right pect diagonally to right abdomen   Scapular Mobilization in left sidelying for right scapula with rhythmic movements into protraction and depression    Passive ROM To right shoulder into flexion, abduction and er to pts tolerance; pect minor stretches                PT Education - 05/18/17 1655    Education provided Yes   Education Details reviewed correct technique of supine scapular series   Person(s) Educated Patient   Methods Explanation;Tactile cues;Verbal cues   Comprehension Verbalized understanding;Returned demonstration                Long Term Clinic Goals - 05/18/17 1659      CC Long Term Goal  #1   Title Pt will be able to independently verbalize lymphedema risk reduction practices   Status On-going     CC Long Term Goal  #2   Title Pt will demonstrate 150 degrees of bilateral shoulder flexion to allow pt to reach items on top shelf and reach up to wash her hair   Status On-going     CC Long Term Goal  #3   Title Pt to demonstrate 150 degrees of bilateral shoulder abduction to allow pt to reach out to sides   Status On-going     CC Long Term Goal  #4   Title Pt to be independent in a home exercise program for continued strengthening and stretching.    Status Partially Met     CC Long Term Goal  #5   Title Pt to report a 75% improvement in edema in right lateral trunk to allow improved comfort and decrease pain   Status On-going            Plan - 05/18/17 1656    Clinical Impression Statement Reviewed supine scapular series with yellow Theraband today, and patient did need some correction for technique.  Manual therapy for right shoulder area tightness and mid-back discomfort.  Pt. still with significant tightness and will benefit from ongoing therapy, so discussed continuing until her implant placement surgery in mid-October, and she agreed. Left wound is still not quite healed.   Rehab Potential Good   Clinical Impairments Affecting Rehab Potential bilateral breast cancer, hx of radiation   PT Frequency 2x / week   PT Duration 4 weeks   PT Treatment/Interventions ADLs/Self Care Home  Management;Therapeutic exercise;Patient/family education;Manual techniques;Manual lymph drainage;Scar mobilization;Passive range of motion;Taping   PT Next Visit Plan  Cont Postural exercise and interscapular strengthening. Continue gentle AA/A/PROM to bilateral shoulders., gentle stretching espcially to left side if wound is healed      Patient will benefit from skilled therapeutic intervention in order to improve the following deficits and impairments:  Increased fascial restricitons, Pain, Postural dysfunction, Decreased scar mobility, Decreased range of motion, Decreased strength, Impaired UE functional use, Increased edema, Decreased knowledge of precautions  Visit Diagnosis: Stiffness of right shoulder, not elsewhere classified - Plan: PT plan of care cert/re-cert  Stiffness of left shoulder, not  elsewhere classified - Plan: PT plan of care cert/re-cert  Acute pain of left shoulder - Plan: PT plan of care cert/re-cert  Abnormal posture - Plan: PT plan of care cert/re-cert  Muscle weakness (generalized) - Plan: PT plan of care cert/re-cert  Acute pain of right shoulder - Plan: PT plan of care cert/re-cert     Problem List Patient Active Problem List   Diagnosis Date Noted  . Breast cancer ([place]) 03/16/2017  . Malignant neoplasm of upper-inner quadrant of left breast in female, estrogen receptor positive ([place]) 01/03/2017  . HTN (hypertension) 06/14/2016  . Malignant neoplasm of lower-inner quadrant of right breast of female, estrogen receptor positive ([place]) 01/07/2016  . Atherosclerosis of aorta ([place])   . Smoker     SALISBURY,DONNA 05/18/2017, [place], [place], [address] Phone: [phone]   Fax:  [phone]  Name: SAMARRAH TRANCHINA MRN: [id number] Date of Birth: 22-Jun-1956  Serafina Royals, PT 05/18/17 5:03 PM

## 2017-05-23 ENCOUNTER — Ambulatory Visit: Payer: 59 | Admitting: Physical Therapy

## 2017-05-23 DIAGNOSIS — M25511 Pain in right shoulder: Secondary | ICD-10-CM

## 2017-05-23 DIAGNOSIS — M25612 Stiffness of left shoulder, not elsewhere classified: Secondary | ICD-10-CM

## 2017-05-23 DIAGNOSIS — R293 Abnormal posture: Secondary | ICD-10-CM

## 2017-05-23 DIAGNOSIS — M25512 Pain in left shoulder: Secondary | ICD-10-CM

## 2017-05-23 DIAGNOSIS — M6281 Muscle weakness (generalized): Secondary | ICD-10-CM

## 2017-05-23 DIAGNOSIS — M25611 Stiffness of right shoulder, not elsewhere classified: Secondary | ICD-10-CM | POA: Diagnosis not present

## 2017-05-23 NOTE — Therapy (Signed)
[place], [place], [address] Phone: [phone]   Fax:  [phone]  Physical Therapy Treatment  Patient Details  Name: MARYBELL ROBARDS MRN: [id number] Date of Birth: 1956-02-17 Referring Provider: Marla Roe  Encounter Date: 05/23/2017      PT End of Session - 05/23/17 1704    Visit Number 8   Number of Visits 16   Date for PT Re-Evaluation 06/15/17   PT Start Time 1657   PT Stop Time 1430   PT Time Calculation (min) 45 min   Activity Tolerance Patient tolerated treatment well   Behavior During Therapy [facility] for tasks assessed/performed      Past Medical History:  Diagnosis Date  . Adie's pupil    LEFT EYE  . Allergy   . Anxiety   . Asthma   . Atherosclerosis of aorta (HCC)    bulge above renal arteries on mri 2016  . Cancer [facility]) 2017   right breast ca  . Complication of anesthesia    VERY HARD TO WAKE UP    . Hypertension   . Personal history of radiation therapy   . PONV (postoperative nausea and vomiting)   . Smoker     Past Surgical History:  Procedure Laterality Date  . ABDOMINAL HYSTERECTOMY    . BREAST BIOPSY Right 01/02/2016  . BREAST EXCISIONAL BIOPSY Right 1979  . BREAST LUMPECTOMY Right 01/27/2016  . BREAST LUMPECTOMY WITH RADIOACTIVE SEED AND SENTINEL LYMPH NODE BIOPSY Right 01/27/2016   Procedure: BREAST LUMPECTOMY WITH RADIOACTIVE SEED AND SENTINEL LYMPH NODE BIOPSY;  Surgeon: Excell Seltzer, MD;  Location: [place];  Service: General;  Laterality: Right;  . BREAST RECONSTRUCTION WITH PLACEMENT OF TISSUE EXPANDER AND FLEX HD (ACELLULAR HYDRATED DERMIS) Bilateral 03/16/2017   Procedure: BILATERAL IMMEDIATE BREAST RECONSTRUCTION WITH PLACEMENT OF TISSUE EXPANDER AND FLEX HD (ACELLULAR HYDRATED DERMIS);  Surgeon: Wallace Going, DO;  Location: [facility];  Service: Plastics;  Laterality: Bilateral;  . BREAST SURGERY  1979   cyst removed rt breast  .  CATARACT EXTRACTION W/ INTRAOCULAR LENS IMPLANT     RT EYE  . CESAREAN SECTION     x2  . MASTECTOMY W/ SENTINEL NODE BIOPSY Bilateral 03/16/2017   Procedure: BILATERAL TOTAL MASTECTOMY WITH LEFT SENTINEL LYMPH NODE BIOPSY;  Surgeon: Excell Seltzer, MD;  Location: [place];  Service: General;  Laterality: Bilateral;  . TOTAL MASTECTOMY Bilateral 03/16/2017   SENTINAL NODE BIOPSY    There were no vitals filed for this visit.      Subjective Assessment - 05/23/17 1354    Subjective Pt states she had a fill on Friday and has had increased pain in back and under arms since then,.  She feels that her posture had improved and it has been worse since the fill. " I got set back"   Pertinent History asthma, 2017 R breast cancer s/p lumpectomy, SLNB and radiation, 2018 L breast cancer s/p bilateral mastectomy and L SLNB bilateral reconstruction 03/16/2017 and is continuing to get fills with plans for implants end of October.    Patient Stated Goals to get more ROM in my right arm   Currently in Pain? Yes   Pain Score 5    Pain Location Axilla   Pain Orientation Right;Left   Pain Descriptors / Indicators Constant   Pain Type Acute pain   Pain Radiating Towards radiating to lateral trunk    Pain Onset In the past 7 days  [facility] Adult PT Treatment/Exercise - 05/23/17 0001      Manual Therapy   Manual Therapy Edema management;Soft tissue mobilization;Manual Lymphatic Drainage (MLD)   Soft tissue mobilization with biotone in left sidelying, soft tissue work to tightness in upper traps and posterior axilla, then same to other side in right sidelying    Myofascial Release in supine, gentle pressure posteriorly to both shouldersfor anterior chest stretch    Manual Lymphatic Drainage (MLD) briefly to fullness in right lower back                         Long Term Clinic Goals - 05/18/17 1659      CC Long Term Goal  #1   Title Pt will be able  to independently verbalize lymphedema risk reduction practices   Status On-going     CC Long Term Goal  #2   Title Pt will demonstrate 150 degrees of bilateral shoulder flexion to allow pt to reach items on top shelf and reach up to wash her hair   Status On-going     CC Long Term Goal  #3   Title Pt to demonstrate 150 degrees of bilateral shoulder abduction to allow pt to reach out to sides   Status On-going     CC Long Term Goal  #4   Title Pt to be independent in a home exercise program for continued strengthening and stretching.    Status Partially Met     CC Long Term Goal  #5   Title Pt to report a 75% improvement in edema in right lateral trunk to allow improved comfort and decrease pain   Status On-going            Plan - 05/23/17 1705    Clinical Impression Statement Pt is having increased pain and muscular tightness in back with some edema in right flank after a fill last week.  She is preferring a round shouldered, flexed posture for pain relief and this is contributing to her discomfort.  She got some relief from soft tissue work today    Clinical Impairments Affecting Rehab Potential bilateral breast cancer, hx of radiation   PT Next Visit Plan continue soft tissue work for pain managment with MLD to swelling and gentle ROM if tolerated    Consulted and Agree with Plan of Care Patient      Patient will benefit from skilled therapeutic intervention in order to improve the following deficits and impairments:  Increased fascial restricitons, Pain, Postural dysfunction, Decreased scar mobility, Decreased range of motion, Decreased strength, Impaired UE functional use, Increased edema, Decreased knowledge of precautions  Visit Diagnosis: Stiffness of right shoulder, not elsewhere classified  Stiffness of left shoulder, not elsewhere classified  Acute pain of left shoulder  Abnormal posture  Muscle weakness (generalized)  Acute pain of right  shoulder     Problem List Patient Active Problem List   Diagnosis Date Noted  . Breast cancer ([place]) 03/16/2017  . Malignant neoplasm of upper-inner quadrant of left breast in female, estrogen receptor positive ([place]) 01/03/2017  . HTN (hypertension) 06/14/2016  . Malignant neoplasm of lower-inner quadrant of right breast of female, estrogen receptor positive ([place]) 01/07/2016  . Atherosclerosis of aorta ([place])   . Smoker    Donato Heinz. Owens Shark PT  Norwood Levo 05/23/2017, 5:12 PM  [place], [place], [address] Phone: [phone]   Fax:  [phone]  Name:  CHAMILLE WERNTZ MRN: [id number] Date of Birth: Jul 24, 1956

## 2017-05-25 ENCOUNTER — Ambulatory Visit: Payer: 59 | Admitting: Physical Therapy

## 2017-05-25 ENCOUNTER — Telehealth: Payer: Self-pay

## 2017-05-25 DIAGNOSIS — M25612 Stiffness of left shoulder, not elsewhere classified: Secondary | ICD-10-CM

## 2017-05-25 DIAGNOSIS — M25611 Stiffness of right shoulder, not elsewhere classified: Secondary | ICD-10-CM | POA: Diagnosis not present

## 2017-05-25 DIAGNOSIS — M25511 Pain in right shoulder: Secondary | ICD-10-CM

## 2017-05-25 DIAGNOSIS — M25512 Pain in left shoulder: Secondary | ICD-10-CM

## 2017-05-25 DIAGNOSIS — R293 Abnormal posture: Secondary | ICD-10-CM

## 2017-05-25 NOTE — Therapy (Signed)
[place], [place], [address] Phone: [phone]   Fax:  [phone]  Physical Therapy Treatment  Patient Details  Name: Krystal Brooks MRN: [id number] Date of Birth: [birth year] Referring Provider: Marla Roe  Encounter Date: 05/25/2017      PT End of Session - 05/25/17 1155    Visit Number 9   Number of Visits 16   Date for PT Re-Evaluation 06/15/17   PT Start Time 1105   PT Stop Time 1149   PT Time Calculation (min) 44 min   Activity Tolerance Patient tolerated treatment well   Behavior During Therapy [facility] for tasks assessed/performed      Past Medical History:  Diagnosis Date  . Adie's pupil    LEFT EYE  . Allergy   . Anxiety   . Asthma   . Atherosclerosis of aorta (HCC)    bulge above renal arteries on mri 2016  . Cancer [facility]) 2017   right breast ca  . Complication of anesthesia    VERY HARD TO WAKE UP    . Hypertension   . Personal history of radiation therapy   . PONV (postoperative nausea and vomiting)   . Smoker     Past Surgical History:  Procedure Laterality Date  . ABDOMINAL HYSTERECTOMY    . BREAST BIOPSY Right 01/02/2016  . BREAST EXCISIONAL BIOPSY Right 1979  . BREAST LUMPECTOMY Right 01/27/2016  . BREAST LUMPECTOMY WITH RADIOACTIVE SEED AND SENTINEL LYMPH NODE BIOPSY Right 01/27/2016   Procedure: BREAST LUMPECTOMY WITH RADIOACTIVE SEED AND SENTINEL LYMPH NODE BIOPSY;  Surgeon: Excell Seltzer, MD;  Location: [place];  Service: General;  Laterality: Right;  . BREAST RECONSTRUCTION WITH PLACEMENT OF TISSUE EXPANDER AND FLEX HD (ACELLULAR HYDRATED DERMIS) Bilateral 03/16/2017   Procedure: BILATERAL IMMEDIATE BREAST RECONSTRUCTION WITH PLACEMENT OF TISSUE EXPANDER AND FLEX HD (ACELLULAR HYDRATED DERMIS);  Surgeon: Wallace Going, DO;  Location: [facility];  Service: Plastics;  Laterality: Bilateral;  . BREAST SURGERY  1979   cyst removed rt breast  .  CATARACT EXTRACTION W/ INTRAOCULAR LENS IMPLANT     RT EYE  . CESAREAN SECTION     x2  . MASTECTOMY W/ SENTINEL NODE BIOPSY Bilateral 03/16/2017   Procedure: BILATERAL TOTAL MASTECTOMY WITH LEFT SENTINEL LYMPH NODE BIOPSY;  Surgeon: Excell Seltzer, MD;  Location: [place];  Service: General;  Laterality: Bilateral;  . TOTAL MASTECTOMY Bilateral 03/16/2017   SENTINAL NODE BIOPSY    There were no vitals filed for this visit.      Subjective Assessment - 05/25/17 1109    Subjective Went back to the doctor because she was so uncomfortable.  The doctor asked her to try to toleate it if she can, but if she can't stand it, to come back on Friday and she would take out some fluid.   Currently in Pain? Yes   Pain Score 4    Pain Location Axilla  and magnet area   Pain Orientation Right   Pain Descriptors / Indicators Tender   Aggravating Factors  recent fill   Pain Relieving Factors pain med,  manual therapy here                         [facility] Adult PT Treatment/Exercise - 05/25/17 0001      Manual Therapy   Soft tissue mobilization with biotone in left sidelying, soft tissue work to tightness in upper traps, upper back and posterior  axilla, then same to other side in right sidelying    Manual Lymphatic Drainage (MLD) in left sidelying to right axillo-inguinal anastomosis and from right upper back to lower back/inguinal area                        Long Term Clinic Goals - 05/18/17 1659      CC Long Term Goal  #1   Title Pt will be able to independently verbalize lymphedema risk reduction practices   Status On-going     CC Long Term Goal  #2   Title Pt will demonstrate 150 degrees of bilateral shoulder flexion to allow pt to reach items on top shelf and reach up to wash her hair   Status On-going     CC Long Term Goal  #3   Title Pt to demonstrate 150 degrees of bilateral shoulder abduction to allow pt to reach out to sides   Status On-going      CC Long Term Goal  #4   Title Pt to be independent in a home exercise program for continued strengthening and stretching.    Status Partially Met     CC Long Term Goal  #5   Title Pt to report a 75% improvement in edema in right lateral trunk to allow improved comfort and decrease pain   Status On-going            Plan - 05/25/17 1156    Clinical Impression Statement Pt. still with significant discomfort from having had a fill of expanders recently.  She saw the doctor yesterday and is trying to tolerate this.  If she can't, the doctor will take some fluid out on Friday. Soft tissue work and a bit of manual lymph drainage helped her feel better today.  She will probably need this continued for a while.   Rehab Potential Good   Clinical Impairments Affecting Rehab Potential bilateral breast cancer, hx of radiation   PT Frequency 2x / week   PT Duration 4 weeks   PT Treatment/Interventions ADLs/Self Care Home Management;Therapeutic exercise;Patient/family education;Manual techniques;Manual lymph drainage;Scar mobilization;Passive range of motion;Taping   PT Next Visit Plan continue soft tissue work for pain managment with MLD to swelling and gentle ROM if tolerated    Consulted and Agree with Plan of Care Patient      Patient will benefit from skilled therapeutic intervention in order to improve the following deficits and impairments:  Increased fascial restricitons, Pain, Postural dysfunction, Decreased scar mobility, Decreased range of motion, Decreased strength, Impaired UE functional use, Increased edema, Decreased knowledge of precautions  Visit Diagnosis: Stiffness of right shoulder, not elsewhere classified  Stiffness of left shoulder, not elsewhere classified  Acute pain of left shoulder  Abnormal posture  Acute pain of right shoulder     Problem List Patient Active Problem List   Diagnosis Date Noted  . Breast cancer ([place]) 03/16/2017  . Malignant neoplasm of  upper-inner quadrant of left breast in female, estrogen receptor positive ([place]) 01/03/2017  . HTN (hypertension) 06/14/2016  . Malignant neoplasm of lower-inner quadrant of right breast of female, estrogen receptor positive ([place]) 01/07/2016  . Atherosclerosis of aorta ([place])   . Smoker     SALISBURY,DONNA 05/25/2017, 11:58 AM  [place], [place], [address] Phone: [phone]   Fax:  [phone]  Name: Krystal Brooks MRN: [id number] Date of Birth: Mar 25, 1956  Serafina Royals, PT 05/25/17 11:58 AM

## 2017-05-25 NOTE — Telephone Encounter (Signed)
Called patient to confirm [facility] visit appt on 06/03/17.  Patient stated she would be here.

## 2017-05-30 ENCOUNTER — Ambulatory Visit: Payer: 59

## 2017-06-03 ENCOUNTER — Encounter: Payer: 59 | Admitting: Adult Health

## 2017-06-06 ENCOUNTER — Ambulatory Visit: Payer: 59 | Attending: Plastic Surgery | Admitting: Physical Therapy

## 2017-06-06 ENCOUNTER — Other Ambulatory Visit: Payer: Self-pay | Admitting: Family Medicine

## 2017-06-06 DIAGNOSIS — M25612 Stiffness of left shoulder, not elsewhere classified: Secondary | ICD-10-CM | POA: Diagnosis present

## 2017-06-06 DIAGNOSIS — M25611 Stiffness of right shoulder, not elsewhere classified: Secondary | ICD-10-CM | POA: Diagnosis not present

## 2017-06-06 DIAGNOSIS — R293 Abnormal posture: Secondary | ICD-10-CM | POA: Insufficient documentation

## 2017-06-06 DIAGNOSIS — M25511 Pain in right shoulder: Secondary | ICD-10-CM | POA: Insufficient documentation

## 2017-06-06 DIAGNOSIS — M25512 Pain in left shoulder: Secondary | ICD-10-CM | POA: Insufficient documentation

## 2017-06-06 NOTE — Telephone Encounter (Signed)
Medication refilled per protocol. 

## 2017-06-06 NOTE — Therapy (Signed)
[place], [place], [address] Phone: [phone]   Fax:  [phone]  Physical Therapy Treatment  Patient Details  Name: Krystal Brooks MRN: [id number] Date of Birth: 10/31/1955 Referring Provider: Marla Roe  Encounter Date: 06/06/2017      PT End of Session - 06/06/17 1345    Visit Number 10   Number of Visits 16   Date for PT Re-Evaluation 06/15/17   PT Start Time 1300   PT Stop Time 1344   PT Time Calculation (min) 44 min   Activity Tolerance Patient tolerated treatment well   Behavior During Therapy [facility] for tasks assessed/performed      Past Medical History:  Diagnosis Date  . Adie's pupil    LEFT EYE  . Allergy   . Anxiety   . Asthma   . Atherosclerosis of aorta (HCC)    bulge above renal arteries on mri 2016  . Cancer [facility]) 2017   right breast ca  . Complication of anesthesia    VERY HARD TO WAKE UP    . Hypertension   . Personal history of radiation therapy   . PONV (postoperative nausea and vomiting)   . Smoker     Past Surgical History:  Procedure Laterality Date  . ABDOMINAL HYSTERECTOMY    . BREAST BIOPSY Right 01/02/2016  . BREAST EXCISIONAL BIOPSY Right 1979  . BREAST LUMPECTOMY Right 01/27/2016  . BREAST LUMPECTOMY WITH RADIOACTIVE SEED AND SENTINEL LYMPH NODE BIOPSY Right 01/27/2016   Procedure: BREAST LUMPECTOMY WITH RADIOACTIVE SEED AND SENTINEL LYMPH NODE BIOPSY;  Surgeon: Excell Seltzer, MD;  Location: [place];  Service: General;  Laterality: Right;  . BREAST RECONSTRUCTION WITH PLACEMENT OF TISSUE EXPANDER AND FLEX HD (ACELLULAR HYDRATED DERMIS) Bilateral 03/16/2017   Procedure: BILATERAL IMMEDIATE BREAST RECONSTRUCTION WITH PLACEMENT OF TISSUE EXPANDER AND FLEX HD (ACELLULAR HYDRATED DERMIS);  Surgeon: Wallace Going, DO;  Location: [place];  Service: Plastics;  Laterality: Bilateral;  . BREAST SURGERY  1979   cyst removed rt breast  .  CATARACT EXTRACTION W/ INTRAOCULAR LENS IMPLANT     RT EYE  . CESAREAN SECTION     x2  . MASTECTOMY W/ SENTINEL NODE BIOPSY Bilateral 03/16/2017   Procedure: BILATERAL TOTAL MASTECTOMY WITH LEFT SENTINEL LYMPH NODE BIOPSY;  Surgeon: Excell Seltzer, MD;  Location: [place];  Service: General;  Laterality: Bilateral;  . TOTAL MASTECTOMY Bilateral 03/16/2017   SENTINAL NODE BIOPSY    There were no vitals filed for this visit.      Subjective Assessment - 06/06/17 1303    Subjective "My surgery's next week."  She got through with the pain after the last fill and is just taking tylenol now.  This week will be her last before surgery, because prre-op is Monday.   Pertinent History asthma, 2017 R breast cancer s/p lumpectomy, SLNB and radiation, 2018 L breast cancer s/p bilateral mastectomy and L SLNB bilateral reconstruction 03/16/2017 and is continuing to get fills with plans for implants end of October.    Currently in Pain? Yes   Pain Score 4    Pain Location Axilla   Pain Orientation Right   Pain Descriptors / Indicators Tender   Aggravating Factors  recent fill   Pain Relieving Factors tylenol            [facility] PT Assessment - 06/06/17 0001      AROM   Right Shoulder Flexion 136 Degrees  has had a lot  of pain the last couple of weeks   Right Shoulder ABduction 148 Degrees  all these measured prior to stretching today   Left Shoulder Flexion 132 Degrees   Left Shoulder ABduction 104 Degrees                     [facility] Adult PT Treatment/Exercise - 06/06/17 0001      Manual Therapy   Manual Therapy Other (comment)   Myofascial Release left, then right UE myofascial pulling with movement into abduction to tolerance   Passive ROM in supine to right and left shoulder for er, abduction, and flexion   Other Manual Therapy In sitting, soft tissue work to neck and upper back for muscle relaxation, with focus on very tight upper traps                         Long Term Clinic Goals - 06/06/17 1308      CC Long Term Goal  #1   Title Pt will be able to independently verbalize lymphedema risk reduction practices   Status On-going     CC Long Term Goal  #2   Title Pt will demonstrate 150 degrees of bilateral shoulder flexion to allow pt to reach items on top shelf and reach up to wash her hair   Status On-going     CC Long Term Goal  #3   Title Pt to demonstrate 150 degrees of bilateral shoulder abduction to allow pt to reach out to sides   Status On-going     CC Long Term Goal  #4   Title Pt to be independent in a home exercise program for continued strengthening and stretching.    Status Partially Met     CC Long Term Goal  #5   Title Pt to report a 75% improvement in edema in right lateral trunk to allow improved comfort and decrease pain   Baseline no change as of 06/06/17   Status On-going            Plan - 06/06/17 1346    Clinical Impression Statement Pt. still with discomfort from expanders and now with exchange surgery scheduled for a week from Wednesday.  Stretching and soft tissue work done today for ROM and pain. Pt. with significant tightness in both upper traps.  Pt. will come once more this week, then not again until after surgery.  Goals not yet met.   Rehab Potential Good   Clinical Impairments Affecting Rehab Potential bilateral breast cancer, hx of radiation   PT Frequency 2x / week   PT Duration 4 weeks   PT Treatment/Interventions ADLs/Self Care Home Management;Therapeutic exercise;Patient/family education;Manual techniques;Manual lymph drainage;Scar mobilization;Passive range of motion;Taping   PT Next Visit Plan continue soft tissue work for pain managment with MLD to swelling and gentle ROM if tolerated    PT Home Exercise Plan supine dowel exercises, supine scap   Consulted and Agree with Plan of Care Patient      Patient will benefit from skilled therapeutic intervention in order to improve the following  deficits and impairments:  Increased fascial restricitons, Pain, Postural dysfunction, Decreased scar mobility, Decreased range of motion, Decreased strength, Impaired UE functional use, Increased edema, Decreased knowledge of precautions  Visit Diagnosis: Stiffness of right shoulder, not elsewhere classified  Stiffness of left shoulder, not elsewhere classified  Acute pain of left shoulder  Abnormal posture  Acute pain of right shoulder  Problem List Patient Active Problem List   Diagnosis Date Noted  . Breast cancer ([place]) 03/16/2017  . Malignant neoplasm of upper-inner quadrant of left breast in female, estrogen receptor positive ([place]) 01/03/2017  . HTN (hypertension) 06/14/2016  . Malignant neoplasm of lower-inner quadrant of right breast of female, estrogen receptor positive ([place]) 01/07/2016  . Atherosclerosis of aorta ([place])   . Smoker     SALISBURY,DONNA 06/06/2017, 1:48 PM  [place], [place], [address] Phone: [phone]   Fax:  [phone]  Name: Krystal Brooks MRN: [id number] Date of Birth: March 21, 1956  Serafina Royals, PT 06/06/17 1:48 PM

## 2017-06-07 ENCOUNTER — Other Ambulatory Visit: Payer: 59

## 2017-06-07 DIAGNOSIS — R7989 Other specified abnormal findings of blood chemistry: Secondary | ICD-10-CM

## 2017-06-07 LAB — BASIC METABOLIC PANEL
BUN: 18 mg/dL (ref 7–25)
CALCIUM: 9.1 mg/dL (ref 8.6–10.4)
CO2: 29 mmol/L (ref 20–32)
Chloride: 100 mmol/L (ref 98–110)
Creat: 0.78 mg/dL (ref 0.50–0.99)
GLUCOSE: 109 mg/dL — AB (ref 65–99)
POTASSIUM: 3.7 mmol/L (ref 3.5–5.3)
Sodium: 136 mmol/L (ref 135–146)

## 2017-06-08 ENCOUNTER — Encounter (HOSPITAL_BASED_OUTPATIENT_CLINIC_OR_DEPARTMENT_OTHER): Payer: Self-pay | Admitting: *Deleted

## 2017-06-08 ENCOUNTER — Ambulatory Visit: Payer: 59 | Admitting: Physical Therapy

## 2017-06-08 DIAGNOSIS — M25612 Stiffness of left shoulder, not elsewhere classified: Secondary | ICD-10-CM

## 2017-06-08 DIAGNOSIS — M25611 Stiffness of right shoulder, not elsewhere classified: Secondary | ICD-10-CM

## 2017-06-08 DIAGNOSIS — R293 Abnormal posture: Secondary | ICD-10-CM

## 2017-06-08 DIAGNOSIS — M25511 Pain in right shoulder: Secondary | ICD-10-CM

## 2017-06-08 DIAGNOSIS — M25512 Pain in left shoulder: Secondary | ICD-10-CM

## 2017-06-08 NOTE — Therapy (Signed)
[place], [place], [address] Phone: [phone]   Fax:  [phone]  Physical Therapy Treatment  Patient Details  Name: Krystal Brooks MRN: [id number] Date of Birth: December 03, 1955 Referring Provider: Marla Roe  Encounter Date: 06/08/2017      PT End of Session - 06/08/17 1348    Visit Number 11   Number of Visits 16   Date for PT Re-Evaluation 06/15/17   PT Start Time 1301   PT Stop Time 1345   PT Time Calculation (min) 44 min   Activity Tolerance Patient tolerated treatment well   Behavior During Therapy [facility] for tasks assessed/performed      Past Medical History:  Diagnosis Date  . Adie's pupil    LEFT EYE  . Allergy   . Anxiety   . Asthma   . Atherosclerosis of aorta (HCC)    bulge above renal arteries on mri 2016  . Cancer [facility]) 2017   right breast ca  . Complication of anesthesia    VERY HARD TO WAKE UP    . Hypertension   . Personal history of radiation therapy   . PONV (postoperative nausea and vomiting)   . Smoker     Past Surgical History:  Procedure Laterality Date  . ABDOMINAL HYSTERECTOMY    . BREAST BIOPSY Right 01/02/2016  . BREAST EXCISIONAL BIOPSY Right 1979  . BREAST LUMPECTOMY Right 01/27/2016  . BREAST LUMPECTOMY WITH RADIOACTIVE SEED AND SENTINEL LYMPH NODE BIOPSY Right 01/27/2016   Procedure: BREAST LUMPECTOMY WITH RADIOACTIVE SEED AND SENTINEL LYMPH NODE BIOPSY;  Surgeon: Excell Seltzer, MD;  Location: [place];  Service: General;  Laterality: Right;  . BREAST RECONSTRUCTION WITH PLACEMENT OF TISSUE EXPANDER AND FLEX HD (ACELLULAR HYDRATED DERMIS) Bilateral 03/16/2017   Procedure: BILATERAL IMMEDIATE BREAST RECONSTRUCTION WITH PLACEMENT OF TISSUE EXPANDER AND FLEX HD (ACELLULAR HYDRATED DERMIS);  Surgeon: Wallace Going, DO;  Location: [place];  Service: Plastics;  Laterality: Bilateral;  . BREAST SURGERY  1979   cyst removed rt breast  .  CATARACT EXTRACTION W/ INTRAOCULAR LENS IMPLANT     RT EYE  . CESAREAN SECTION     x2  . MASTECTOMY W/ SENTINEL NODE BIOPSY Bilateral 03/16/2017   Procedure: BILATERAL TOTAL MASTECTOMY WITH LEFT SENTINEL LYMPH NODE BIOPSY;  Surgeon: Excell Seltzer, MD;  Location: [place];  Service: General;  Laterality: Bilateral;  . TOTAL MASTECTOMY Bilateral 03/16/2017   SENTINAL NODE BIOPSY    There were no vitals filed for this visit.      Subjective Assessment - 06/08/17 1303    Subjective Nothing new.  Counting the days till surgery. Hand numbness that she had has gotten better.   Pertinent History asthma, 2017 R breast cancer s/p lumpectomy, SLNB and radiation, 2018 L breast cancer s/p bilateral mastectomy and L SLNB bilateral reconstruction 03/16/2017 and is continuing to get fills with plans for implants end of October.    Currently in Pain? Yes   Pain Score 4    Pain Location Axilla   Pain Orientation Right   Pain Descriptors / Indicators --  "I know it's there."   Pain Type Surgical pain                         [facility] Adult PT Treatment/Exercise - 06/08/17 0001      Shoulder Exercises: Supine   Other Supine Exercises supine over towel roll, hold horizontal abduction bilat. x 30 seconds,  then active horizontal abduction x 10     Manual Therapy   Myofascial Release unidirectional pulling at right pect. , same at left   Passive ROM In supine, pect minor stretches bilat.. 2, prolonged; passive er, abduction, and flexion of right then left shouders. Repeated pect minor stretches in supine over towel roll.   Other Manual Therapy In sitting, soft tissue work to neck and upper back for muscle relaxation, with focus on very tight upper traps                        Long Term Clinic Goals - 06/06/17 1308      CC Long Term Goal  #1   Title Pt will be able to independently verbalize lymphedema risk reduction practices   Status On-going     CC Long Term Goal   #2   Title Pt will demonstrate 150 degrees of bilateral shoulder flexion to allow pt to reach items on top shelf and reach up to wash her hair   Status On-going     CC Long Term Goal  #3   Title Pt to demonstrate 150 degrees of bilateral shoulder abduction to allow pt to reach out to sides   Status On-going     CC Long Term Goal  #4   Title Pt to be independent in a home exercise program for continued strengthening and stretching.    Status Partially Met     CC Long Term Goal  #5   Title Pt to report a 75% improvement in edema in right lateral trunk to allow improved comfort and decrease pain   Baseline no change as of 06/06/17   Status On-going            Plan - 06/08/17 1348    Clinical Impression Statement Pt. notes benefit of soft tissue work to neck and upper back in having decreased numbness in right hand, which is an older problem.  Still with discomfort and tightness from expanders, and she really rounds her shoulders forward because of this.  Encouraged scapular retraction.  She will have permanent implant surgery next Wednesday and may need therapy following that.   Rehab Potential Good   Clinical Impairments Affecting Rehab Potential bilateral breast cancer, hx of radiation   PT Frequency 2x / week   PT Duration 4 weeks   PT Treatment/Interventions ADLs/Self Care Home Management;Therapeutic exercise;Patient/family education;Manual techniques;Manual lymph drainage;Scar mobilization;Passive range of motion;Taping   PT Next Visit Plan On hold until after she has her surgery to place permanent implants on 06/15/17.  Re-eval after than if/when referred and continue appropriate treatment.   PT Home Exercise Plan supine dowel exercises, supine scap   Consulted and Agree with Plan of Care Patient      Patient will benefit from skilled therapeutic intervention in order to improve the following deficits and impairments:  Increased fascial restricitons, Pain, Postural dysfunction,  Decreased scar mobility, Decreased range of motion, Decreased strength, Impaired UE functional use, Increased edema, Decreased knowledge of precautions  Visit Diagnosis: Stiffness of right shoulder, not elsewhere classified  Stiffness of left shoulder, not elsewhere classified  Acute pain of left shoulder  Abnormal posture  Acute pain of right shoulder     Problem List Patient Active Problem List   Diagnosis Date Noted  . Breast cancer ([place]) 03/16/2017  . Malignant neoplasm of upper-inner quadrant of left breast in female, estrogen receptor positive ([place]) 01/03/2017  . HTN (hypertension) 06/14/2016  .  Malignant neoplasm of lower-inner quadrant of right breast of female, estrogen receptor positive ([place]) 01/07/2016  . Atherosclerosis of aorta ([place])   . Smoker     SALISBURY,DONNA 06/08/2017, 1:54 PM  [place], [place], [address] Phone: [phone]   Fax:  [phone]  Name: Krystal Brooks MRN: [id number] Date of Birth: 11/10/1955  Serafina Royals, PT 06/08/17 1:54 PM

## 2017-06-10 ENCOUNTER — Encounter: Payer: Self-pay | Admitting: Adult Health

## 2017-06-10 ENCOUNTER — Ambulatory Visit: Payer: Self-pay | Admitting: Plastic Surgery

## 2017-06-10 ENCOUNTER — Ambulatory Visit (HOSPITAL_BASED_OUTPATIENT_CLINIC_OR_DEPARTMENT_OTHER): Payer: 59 | Admitting: Adult Health

## 2017-06-10 VITALS — BP 152/90 | HR 95 | Temp 98.2°F | Resp 18 | Ht 61.0 in | Wt 123.2 lb

## 2017-06-10 DIAGNOSIS — C50311 Malignant neoplasm of lower-inner quadrant of right female breast: Secondary | ICD-10-CM | POA: Diagnosis not present

## 2017-06-10 DIAGNOSIS — Z170 Estrogen receptor positive status [ER+]: Secondary | ICD-10-CM

## 2017-06-10 DIAGNOSIS — C50212 Malignant neoplasm of upper-inner quadrant of left female breast: Secondary | ICD-10-CM | POA: Diagnosis not present

## 2017-06-10 DIAGNOSIS — Z9013 Acquired absence of bilateral breasts and nipples: Secondary | ICD-10-CM

## 2017-06-10 DIAGNOSIS — Z79810 Long term (current) use of selective estrogen receptor modulators (SERMs): Secondary | ICD-10-CM

## 2017-06-10 NOTE — H&P (Signed)
Krystal Brooks is an 61 y.o. female.   Chief Complaint: acquired absence of breasts. HPI: Krystal Brooks is a 61 yo female here for bilateral breast reconstruction.  She had tissue expanders and dermal matrix placement following bilateral mastectomies 03/16/17.  IMPLANTS: Left - Mentor Smooth 300cc with 340 cc saline  Right - Mentor Smooth 300cc with 340 cc saline History:   She was recently diagnosed with LEFT breast cancer.  She has been getting a mammogram every 6 months because of her right breast cancer last year.  She underwent a lumpectomy with radiation for treatment.  She now has invasive lobular carcinoma that is ER positive, PR negative and HER-2 negative.  The right side was also lobular carcinoma.  She was treated with Arimidex.  She is not sure about chemotherapy but leaning toward not undergoing it.   She is 5 feet 1 inch tall and weight is 128 pounds. Preop bra = 34 - 36 C. She was referred by Dr. Excell Seltzer.   Past Medical History:  Diagnosis Date  . Adie's pupil    LEFT EYE  . Allergy   . Anxiety   . Asthma   . Atherosclerosis of aorta (HCC)    bulge above renal arteries on mri 2016  . Cancer [facility]) 2017   right breast ca  . Complication of anesthesia    VERY HARD TO WAKE UP    . Hypertension   . Personal history of radiation therapy   . PONV (postoperative nausea and vomiting)   . Smoker     Past Surgical History:  Procedure Laterality Date  . ABDOMINAL HYSTERECTOMY    . BREAST BIOPSY Right 01/02/2016  . BREAST EXCISIONAL BIOPSY Right 1979  . BREAST LUMPECTOMY Right 01/27/2016  . BREAST LUMPECTOMY WITH RADIOACTIVE SEED AND SENTINEL LYMPH NODE BIOPSY Right 01/27/2016   Procedure: BREAST LUMPECTOMY WITH RADIOACTIVE SEED AND SENTINEL LYMPH NODE BIOPSY;  Surgeon: Excell Seltzer, MD;  Location: [place];  Service: General;  Laterality: Right;  . BREAST RECONSTRUCTION WITH PLACEMENT OF TISSUE EXPANDER AND FLEX HD (ACELLULAR HYDRATED DERMIS) Bilateral  03/16/2017   Procedure: BILATERAL IMMEDIATE BREAST RECONSTRUCTION WITH PLACEMENT OF TISSUE EXPANDER AND FLEX HD (ACELLULAR HYDRATED DERMIS);  Surgeon: Wallace Going, DO;  Location: [place];  Service: Plastics;  Laterality: Bilateral;  . BREAST SURGERY  1979   cyst removed rt breast  . CATARACT EXTRACTION W/ INTRAOCULAR LENS IMPLANT     RT EYE  . CESAREAN SECTION     x2  . MASTECTOMY W/ SENTINEL NODE BIOPSY Bilateral 03/16/2017   Procedure: BILATERAL TOTAL MASTECTOMY WITH LEFT SENTINEL LYMPH NODE BIOPSY;  Surgeon: Excell Seltzer, MD;  Location: [place];  Service: General;  Laterality: Bilateral;  . TOTAL MASTECTOMY Bilateral 03/16/2017   SENTINAL NODE BIOPSY    Family History  Problem Relation Age of Onset  . Hearing loss Mother   . Asthma Father   . Cancer Father        melanoma, metastases   . Diabetes Father   . Heart disease Father   . Early death Sister   . Early death Paternal Grandfather   . Cancer Maternal Grandmother 47       colon cancer    Social History:  reports that she has quit smoking. Her smoking use included Cigarettes. She has a 20.00 pack-year smoking history. She has never used smokeless tobacco. She reports that she drinks alcohol. She reports that she does not use drugs.  Allergies:  Allergies  Allergen Reactions  . Penicillins Swelling and Rash    Older sister died from reaction to penicillin Has patient had a PCN reaction causing immediate rash, facial/tongue/throat swelling, SOB or lightheadedness with hypotension: Yes Has patient had a PCN reaction causing severe rash involving mucus membranes or skin necrosis: No Has patient had a PCN reaction that required hospitalization: No Has patient had a PCN reaction occurring within the last 10 years: No If all of the above answers are "NO", then may proceed with Cephalosporin use.   . Sulfa Antibiotics Rash     (Not in a hospital admission)  No results found for this or any previous visit (from  the past 48 hour(s)). No results found.  Review of Systems  Constitutional: Negative.   HENT: Negative.   Eyes: Negative.   Respiratory: Negative.   Cardiovascular: Negative.   Gastrointestinal: Negative.   Genitourinary: Negative.   Musculoskeletal: Negative.   Skin: Negative.   Neurological: Negative.   Psychiatric/Behavioral: Negative.     There were no vitals taken for this visit. Physical Exam  Constitutional: She is oriented to person, place, and time. She appears well-developed and well-nourished.  HENT:  Head: Normocephalic and atraumatic.  Eyes: Pupils are equal, round, and reactive to light. EOM are normal.  Cardiovascular: Normal rate.   Respiratory: Effort normal.  GI: She exhibits no distension. There is no tenderness.  Neurological: She is alert and oriented to person, place, and time.  Skin: Skin is warm. No rash noted. No erythema. No pallor.  Psychiatric: She has a normal mood and affect. Judgment and thought content normal.     Assessment/Plan Plan for removal of expanders and placement of silicone implants.  CLAIRE S DILLINGHAM, DO 06/10/2017, 10:20 AM   

## 2017-06-10 NOTE — Progress Notes (Signed)
CLINIC:  Survivorship   REASON FOR VISIT:  Routine follow-up post-treatment for a recent history of breast cancer.  BRIEF ONCOLOGIC HISTORY:  Oncology History   Breast cancer of lower-inner quadrant of right female breast [facility])   Staging form: Breast, AJCC 7th Edition     Clinical stage from 01/02/2016: Stage IA (T1a, N0, M0) - Signed by Truitt Merle, MD on 01/14/2016     Pathologic stage from 01/27/2016: Stage IA (T1b, N0, cM0) - Signed by Truitt Merle, MD on 03/07/2016          Malignant neoplasm of lower-inner quadrant of right breast of female, estrogen receptor positive ([place])   12/29/2015 Mammogram    Diagnostic right mammogram showed a 2.3 x 2.1 x 2.2 cm group of coarse calcification within the medial slightly lower right breast.      01/02/2016 Initial Diagnosis    Breast cancer of lower-inner quadrant of right female breast ([place])      01/02/2016 Initial Biopsy    right breast LIQ core needle biopsy showed LCIS, with a small focus of invasive lobular carcinoma, grade 2.       01/02/2016 Receptors her2    ER 10% positive, moderate staining. PR negative. Insufficient tissue for HER-2 testing.      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 0/2 SLN.       01/27/2016 Receptors her2    The surgical sample of the invasive carcinoma was not sufficient for Her2 test and other additional studies.      03/09/2016 - 04/19/2016 Radiation Therapy    Adjuvant breast radiation [name]). Right breast: 50 Gy in 25 fractions. Right breast "boost": 10 Gy in 5 fractions.        04/2016 -  Anti-estrogen oral therapy    Anastrozole 1 mg daily. Planned duration of therapy: 5 years       05/24/2016 Imaging    DEXA scan:  Normal (T-score -0.6)      06/21/2016 Imaging    MM and [place] Right Breast 06/21/2016 IMPRESSION: No evidence of malignancy within the right breast. Specifically, no evidence of malignancy within the outer right breast corresponding to the area of clinical concern. There are expected postsurgical changes within the inner right breast. RECOMMENDATION: Annual bilateral diagnostic mammograms. Next bilateral mammogram will be due in April of 2018. Benign causes of breast pain, and possible remedies, were discussed with the patient. Patient was encouraged to follow-up with referring physician if pain became localized and persistent or if a palpable lump/mass developed.       Malignant neoplasm of upper-inner quadrant of left breast in female, estrogen receptor positive ([place])   12/29/2016 Initial Biopsy    ILC, grade2, ER+(100%), PR-, Ki-67 15%, HER-2 negative (ratio 1.35).        12/29/2016 Oncotype testing    Intermediate, 22/14%      01/03/2017 Initial Diagnosis    Malignant neoplasm of upper-inner quadrant of left breast in female, estrogen receptor positive ([place])     03/16/2017 Surgery    Bilateral mastectomies ([place]): Right, no malignancies, 2 SLN negative, Left: ILC, grade 2, 2.3cm, LCIS, margins negative, 1SLN negative, T2, N0      04/2017 -  Anti-estrogen oral therapy  Tamoxifen daily       INTERVAL HISTORY:  Ms. Diefendorf presents to the [facility] today for our initial meeting to review her survivorship care plan detailing her treatment course for breast cancer, as well as monitoring long-term side effects of that treatment, education regarding health maintenance, screening, and overall wellness and health promotion.     Overall, Ms. Croston reports feeling quite well.  She is taking Tamoxifen daily and is doing well with this.  She also is planning on her upcoming implant placement with Dr. Marla Roe.      REVIEW OF SYSTEMS:  Review of Systems  Constitutional:  Negative for appetite change, chills, fatigue, fever and unexpected weight change.  HENT:   Negative for hearing loss and lump/mass.   Eyes: Negative for eye problems and icterus.  Respiratory: Negative for chest tightness, cough and shortness of breath.   Cardiovascular: Negative for chest pain, leg swelling and palpitations.  Gastrointestinal: Negative for abdominal distention, abdominal pain, constipation, diarrhea, nausea and vomiting.  Endocrine: Negative for hot flashes.  Genitourinary: Negative for difficulty urinating.   Skin: Negative for itching and rash.  Neurological: Negative for dizziness, extremity weakness and headaches.  Hematological: Negative for adenopathy. Does not bruise/bleed easily.  Psychiatric/Behavioral: Negative for depression. The patient is not nervous/anxious.   Breast: Denies any new nodularity, masses, tenderness, nipple changes, or nipple discharge.      ONCOLOGY TREATMENT TEAM:  1. Surgeon:  Dr. Excell Seltzer at [facility] 2. Medical Oncologist: Dr. Jana Hakim      PAST MEDICAL/SURGICAL HISTORY:  Past Medical History:  Diagnosis Date  . Adie's pupil    LEFT EYE  . Allergy   . Anxiety   . Asthma   . Atherosclerosis of aorta (HCC)    bulge above renal arteries on mri 2016  . Cancer [facility]) 2017   right breast ca  . Complication of anesthesia    VERY HARD TO WAKE UP    . Hypertension   . Personal history of radiation therapy   . PONV (postoperative nausea and vomiting)   . Smoker    Past Surgical History:  Procedure Laterality Date  . ABDOMINAL HYSTERECTOMY    . BREAST BIOPSY Right 01/02/2016  . BREAST EXCISIONAL BIOPSY Right 1979  . BREAST LUMPECTOMY Right 01/27/2016  . BREAST LUMPECTOMY WITH RADIOACTIVE SEED AND SENTINEL LYMPH NODE BIOPSY Right 01/27/2016   Procedure: BREAST LUMPECTOMY WITH RADIOACTIVE SEED AND SENTINEL LYMPH NODE BIOPSY;  Surgeon: Excell Seltzer, MD;  Location: [place];  Service: General;   Laterality: Right;  . BREAST RECONSTRUCTION WITH PLACEMENT OF TISSUE EXPANDER AND FLEX HD (ACELLULAR HYDRATED DERMIS) Bilateral 03/16/2017   Procedure: BILATERAL IMMEDIATE BREAST RECONSTRUCTION WITH PLACEMENT OF TISSUE EXPANDER AND FLEX HD (ACELLULAR HYDRATED DERMIS);  Surgeon: Wallace Going, DO;  Location: [place];  Service: Plastics;  Laterality: Bilateral;  . BREAST SURGERY  1979   cyst removed rt breast  . CATARACT EXTRACTION W/ INTRAOCULAR LENS IMPLANT     RT EYE  . CESAREAN SECTION     x2  . MASTECTOMY W/ SENTINEL NODE BIOPSY Bilateral 03/16/2017   Procedure: BILATERAL TOTAL MASTECTOMY WITH LEFT SENTINEL LYMPH NODE BIOPSY;  Surgeon: Excell Seltzer, MD;  Location: [place];  Service: General;  Laterality: Bilateral;  . TOTAL MASTECTOMY Bilateral 03/16/2017   SENTINAL NODE BIOPSY     ALLERGIES:  Allergies  Allergen Reactions  . Penicillins Swelling and Rash    Older sister died from reaction to penicillin Has patient had  a PCN reaction causing immediate rash, facial/tongue/throat swelling, SOB or lightheadedness with hypotension: Yes Has patient had a PCN reaction causing severe rash involving mucus membranes or skin necrosis: No Has patient had a PCN reaction that required hospitalization: No Has patient had a PCN reaction occurring within the last 10 years: No If all of the above answers are "NO", then may proceed with Cephalosporin use.   . Sulfa Antibiotics Rash     CURRENT MEDICATIONS:  Outpatient Encounter Prescriptions as of 06/10/2017  Medication Sig  . acetaminophen (TYLENOL) 500 MG tablet Take 1,500 mg by mouth daily as needed for moderate pain or headache.  . albuterol (VENTOLIN HFA) 108 (90 Base) MCG/ACT inhaler USE 1-2 PUFFS EVERY 6 HOURS AS NEEDED FOR WHEEZING  . ANORO ELLIPTA 62.5-25 MCG/INH AEPB INHALE 1 PUFF INTO THE LUNGS DAILY (Patient taking differently: Inhale 1 puff into the lungs every evening. )  . diazepam (VALIUM) 2 MG tablet Take 2 mg by mouth  every 6 (six) hours as needed for anxiety.  Marland Kitchen ibuprofen (ADVIL,MOTRIN) 200 MG tablet Take 600 mg by mouth daily as needed for moderate pain.  Marland Kitchen losartan (COZAAR) 100 MG tablet Take 1 tablet (100 mg total) by mouth daily.  . montelukast (SINGULAIR) 10 MG tablet TAKE 1 TABLET BY MOUTH EVERY DAY (Patient taking differently: TAKE 1 TABLET BY MOUTH EVERY DAY AT BEDTIME)  . Multiple Vitamin (MULTIVITAMIN WITH MINERALS) TABS tablet Take 1 tablet by mouth daily.  . tamoxifen (NOLVADEX) 20 MG tablet Take by mouth.  . triamterene-hydrochlorothiazide (MAXZIDE-25) 37.5-25 MG tablet Take 1 tablet by mouth daily. Stop amlodipine   No facility-administered encounter medications on file as of 06/10/2017.      ONCOLOGIC FAMILY HISTORY:  Family History  Problem Relation Age of Onset  . Hearing loss Mother   . Asthma Father   . Cancer Father        melanoma, metastases   . Diabetes Father   . Heart disease Father   . Early death Sister   . Early death Paternal Grandfather   . Cancer Maternal Grandmother 88       colon cancer       PHYSICAL EXAMINATION:  Vital Signs:   Vitals:   06/10/17 0909  BP: (!) 152/90  Pulse: 95  Resp: 18  Temp: 98.2 F (36.8 C)  SpO2: 100%   Filed Weights   06/10/17 0909  Weight: 123 lb 3.2 oz (55.9 kg)   General: Well-nourished, well-appearing female in no acute distress.  She is unaccompanied today.   HEENT: Head is normocephalic.  Pupils equal and reactive to light. Conjunctivae clear without exudate.  Sclerae anicteric. Oral mucosa is pink, moist.  Oropharynx is pink without lesions or erythema.  Lymph: No cervical, supraclavicular, or infraclavicular lymphadenopathy noted on palpation.  Cardiovascular: Regular rate and rhythm.Marland Kitchen Respiratory: Clear to auscultation bilaterally. Chest expansion symmetric; breathing non-labored.  GI: Abdomen soft and round; non-tender, non-distended. Bowel sounds normoactive.  GU: Deferred.  Neuro: No focal deficits. Steady  gait.  Psych: Mood and affect normal and appropriate for situation.  Extremities: No edema. MSK: No focal spinal tenderness to palpation.  Full range of motion in bilateral upper extremities Skin: Warm and dry.  LABORATORY DATA:  None for this visit.  DIAGNOSTIC IMAGING:  None for this visit.      ASSESSMENT AND PLAN:  Ms.. Dishner is a pleasant 61 y.o. female with Stage IA right breast invasive lobular carcinoma, ER+/PR+/HER2-, diagnosed in 12/2015, treated with lumpectomy, adjuvant  radiation therapy, and anti-estrogen therapy with Anastrozole beginning in 04/2016.  She was then diagnosed with stage IIA left breast invasive lobular carcinoma, ER+/PR-/HER2-, diagnosed in 12/2016 treated with bilateral mastectomies and anti estrogen therapy with Tamoxifen starting in 04/2017.  She presents to the Survivorship Clinic for our initial meeting and routine follow-up post-completion of treatment for breast cancer.    1. Stage IIA left breast cancer and Stage IA right breast cancer:  Ms. Hapke is continuing to recover from definitive treatment for breast cancer. She will follow-up with her medical oncologist, Dr. Jana Hakim in 06/2017 with history and physical exam per surveillance protocol.  She will continue her anti-estrogen therapy with Tamoxifen. Thus far, she is tolerating the Tamoxifen well, with minimal side effects. She was instructed to make Dr. Jana Hakim or myself aware if she begins to experience any worsening side effects of the medication and I could see her back in clinic to help manage those side effects, as needed. Today, a comprehensive survivorship care plan and treatment summary was reviewed with the patient today detailing her breast cancer diagnosis, treatment course, potential late/long-term effects of treatment, appropriate follow-up care with recommendations for the future, and patient education resources.  A copy of this summary, along with a letter will be sent to the patient's primary  care provider via mail/fax/In Basket message after today's visit.    2. Bone health:  Given Ms. Mccutchen's age/history of breast cancer and her previous treatment regimen including anti-estrogen therapy with Anastrozole, she is at risk for bone demineralization.  Her last DEXA scan was 05/24/2016, which showed normal results.  I counseled her that the Tamoxifen that she is now taking will have a protective effect on her bones.  In the meantime, she was encouraged to increase her consumption of foods rich in calcium, as well as increase her weight-bearing activities.  She was given education on specific activities to promote bone health.  3. Cancer screening:  Due to Ms. Rinck's history and her age, she should receive screening for skin cancers, colon cancer, and gynecologic cancers.  The information and recommendations are listed on the patient's comprehensive care plan/treatment summary and were reviewed in detail with the patient.    4. Health maintenance and wellness promotion: Ms. Kopplin was encouraged to consume 5-7 servings of fruits and vegetables per day. We reviewed the "Nutrition Rainbow" handout, as well as the handout "Take Control of Your Health and Reduce Your Cancer Risk" from the [place].  She was also encouraged to engage in moderate to vigorous exercise for 30 minutes per day most days of the week. We discussed the [employer] fitness program, which is designed for cancer survivors to help them become more physically fit after cancer treatments.  She was instructed to limit her alcohol consumption and continue to abstain from tobacco use.     5. Support services/counseling: It is not uncommon for this period of the patient's cancer care trajectory to be one of many emotions and stressors.  We discussed an opportunity for her to participate in the next session of [facility] ("Finding Your New Normal") support group series designed for patients after they have completed treatment.    Ms. Boxell was encouraged to take advantage of our many other support services programs, support groups, and/or counseling in coping with her new life as a cancer survivor after completing anti-cancer treatment.  She was offered support today through active listening and expressive supportive counseling.  She was given information regarding our available  services and encouraged to contact me with any questions or for help enrolling in any of our support group/programs.    Dispo:   -Return to cancer center for follow up with Dr. Jana Hakim in 06/2017  -Follow up with Dr. Excell Seltzer at [facility] in 11/2017 -She is welcome to return back to the Survivorship Clinic at any time; no additional follow-up needed at this time.  -Consider referral back to survivorship as a long-term survivor for continued surveillance  A total of (30) minutes of face-to-face time was spent with this patient with greater than 50% of that time in counseling and care-coordination.   Gardenia Phlegm, NP Survivorship Program [facility] [phone]   Note: PRIMARY CARE PROVIDER Susy Frizzle, [place] [phone]

## 2017-06-13 ENCOUNTER — Telehealth: Payer: Self-pay

## 2017-06-13 ENCOUNTER — Encounter: Payer: 59 | Admitting: Physical Therapy

## 2017-06-13 NOTE — Telephone Encounter (Signed)
No los. Per 10/12 los

## 2017-06-14 ENCOUNTER — Ambulatory Visit: Payer: Self-pay | Admitting: Plastic Surgery

## 2017-06-14 DIAGNOSIS — Z9013 Acquired absence of bilateral breasts and nipples: Secondary | ICD-10-CM

## 2017-06-15 ENCOUNTER — Ambulatory Visit (HOSPITAL_BASED_OUTPATIENT_CLINIC_OR_DEPARTMENT_OTHER): Payer: 59 | Admitting: Anesthesiology

## 2017-06-15 ENCOUNTER — Encounter: Payer: 59 | Admitting: Physical Therapy

## 2017-06-15 ENCOUNTER — Encounter (HOSPITAL_BASED_OUTPATIENT_CLINIC_OR_DEPARTMENT_OTHER): Payer: Self-pay | Admitting: *Deleted

## 2017-06-15 ENCOUNTER — Ambulatory Visit (HOSPITAL_BASED_OUTPATIENT_CLINIC_OR_DEPARTMENT_OTHER)
Admission: RE | Admit: 2017-06-15 | Discharge: 2017-06-15 | Disposition: A | Discharge status: Home / Self Care | Payer: 59 | Source: Ambulatory Visit | Attending: Plastic Surgery | Admitting: Plastic Surgery

## 2017-06-15 ENCOUNTER — Encounter (HOSPITAL_BASED_OUTPATIENT_CLINIC_OR_DEPARTMENT_OTHER)
Admission: RE | Disposition: A | Discharge status: Home / Self Care | Payer: Self-pay | Source: Ambulatory Visit | Attending: Plastic Surgery

## 2017-06-15 DIAGNOSIS — I739 Peripheral vascular disease, unspecified: Secondary | ICD-10-CM | POA: Diagnosis not present

## 2017-06-15 DIAGNOSIS — J45909 Unspecified asthma, uncomplicated: Secondary | ICD-10-CM | POA: Insufficient documentation

## 2017-06-15 DIAGNOSIS — Z853 Personal history of malignant neoplasm of breast: Secondary | ICD-10-CM | POA: Diagnosis not present

## 2017-06-15 DIAGNOSIS — Z9013 Acquired absence of bilateral breasts and nipples: Secondary | ICD-10-CM | POA: Insufficient documentation

## 2017-06-15 DIAGNOSIS — Z170 Estrogen receptor positive status [ER+]: Secondary | ICD-10-CM | POA: Diagnosis not present

## 2017-06-15 DIAGNOSIS — F419 Anxiety disorder, unspecified: Secondary | ICD-10-CM | POA: Diagnosis not present

## 2017-06-15 DIAGNOSIS — Z800 Family history of malignant neoplasm of digestive organs: Secondary | ICD-10-CM | POA: Insufficient documentation

## 2017-06-15 DIAGNOSIS — Z8249 Family history of ischemic heart disease and other diseases of the circulatory system: Secondary | ICD-10-CM | POA: Diagnosis not present

## 2017-06-15 DIAGNOSIS — Z90710 Acquired absence of both cervix and uterus: Secondary | ICD-10-CM | POA: Diagnosis not present

## 2017-06-15 DIAGNOSIS — Z9841 Cataract extraction status, right eye: Secondary | ICD-10-CM | POA: Insufficient documentation

## 2017-06-15 DIAGNOSIS — Z880 Allergy status to penicillin: Secondary | ICD-10-CM | POA: Diagnosis not present

## 2017-06-15 DIAGNOSIS — Z882 Allergy status to sulfonamides status: Secondary | ICD-10-CM | POA: Diagnosis not present

## 2017-06-15 DIAGNOSIS — C50311 Malignant neoplasm of lower-inner quadrant of right female breast: Secondary | ICD-10-CM | POA: Diagnosis not present

## 2017-06-15 DIAGNOSIS — I700 Atherosclerosis of aorta: Secondary | ICD-10-CM | POA: Insufficient documentation

## 2017-06-15 DIAGNOSIS — I10 Essential (primary) hypertension: Secondary | ICD-10-CM | POA: Diagnosis not present

## 2017-06-15 DIAGNOSIS — Z87891 Personal history of nicotine dependence: Secondary | ICD-10-CM | POA: Insufficient documentation

## 2017-06-15 DIAGNOSIS — Z9011 Acquired absence of right breast and nipple: Secondary | ICD-10-CM | POA: Diagnosis not present

## 2017-06-15 DIAGNOSIS — Z833 Family history of diabetes mellitus: Secondary | ICD-10-CM | POA: Diagnosis not present

## 2017-06-15 DIAGNOSIS — Z808 Family history of malignant neoplasm of other organs or systems: Secondary | ICD-10-CM | POA: Insufficient documentation

## 2017-06-15 DIAGNOSIS — Z825 Family history of asthma and other chronic lower respiratory diseases: Secondary | ICD-10-CM | POA: Diagnosis not present

## 2017-06-15 DIAGNOSIS — C50212 Malignant neoplasm of upper-inner quadrant of left female breast: Secondary | ICD-10-CM | POA: Diagnosis not present

## 2017-06-15 DIAGNOSIS — H57052 Tonic pupil, left eye: Secondary | ICD-10-CM | POA: Insufficient documentation

## 2017-06-15 DIAGNOSIS — T8579XA Infection and inflammatory reaction due to other internal prosthetic devices, implants and grafts, initial encounter: Secondary | ICD-10-CM | POA: Diagnosis not present

## 2017-06-15 DIAGNOSIS — L905 Scar conditions and fibrosis of skin: Secondary | ICD-10-CM | POA: Diagnosis not present

## 2017-06-15 HISTORY — PX: REMOVAL OF BILATERAL TISSUE EXPANDERS WITH PLACEMENT OF BILATERAL BREAST IMPLANTS: SHX6431

## 2017-06-15 SURGERY — REMOVAL, TISSUE EXPANDER, BREAST, BILATERAL, WITH BILATERAL IMPLANT IMPLANT INSERTION
Anesthesia: General | Laterality: Bilateral

## 2017-06-15 MED ORDER — CIPROFLOXACIN IN D5W 400 MG/200ML IV SOLN
400.0000 mg | INTRAVENOUS | Status: AC
  Administered 2017-06-15: 400 mg via INTRAVENOUS

## 2017-06-15 MED ORDER — OXYCODONE HCL 5 MG PO TABS
5.0000 mg | ORAL_TABLET | ORAL | Status: DC | PRN
Start: 2017-06-15 — End: 2017-06-15

## 2017-06-15 MED ORDER — ACETAMINOPHEN 650 MG RE SUPP: 650.0000 mg | RECTAL | Status: DC | PRN

## 2017-06-15 MED ORDER — BUPIVACAINE-EPINEPHRINE 0.25% -1:200000 IJ SOLN
INTRAMUSCULAR | Status: DC | PRN
  Administered 2017-06-15: 3 mL

## 2017-06-15 MED ORDER — MIDAZOLAM HCL 2 MG/2ML IJ SOLN
1.0000 mg | INTRAMUSCULAR | Status: DC | PRN
  Administered 2017-06-15: 2 mg via INTRAVENOUS

## 2017-06-15 MED ORDER — LACTATED RINGERS IV SOLN
INTRAVENOUS | Status: DC
  Administered 2017-06-15 (×2): via INTRAVENOUS

## 2017-06-15 MED ORDER — SODIUM CHLORIDE 0.9% FLUSH: 3.0000 mL | Freq: Two times a day (BID) | INTRAVENOUS | Status: DC

## 2017-06-15 MED ORDER — SCOPOLAMINE 1 MG/3DAYS TD PT72: 1.0000 | MEDICATED_PATCH | Freq: Once | TRANSDERMAL | Status: DC | PRN

## 2017-06-15 MED ORDER — SODIUM CHLORIDE 0.9 % IV SOLN: 250.0000 mL | INTRAVENOUS | Status: DC | PRN

## 2017-06-15 MED ORDER — FENTANYL CITRATE (PF) 100 MCG/2ML IJ SOLN
50.0000 ug | INTRAMUSCULAR | Status: DC | PRN
  Administered 2017-06-15 (×2): 100 ug via INTRAVENOUS

## 2017-06-15 MED ORDER — MIDAZOLAM HCL 2 MG/2ML IJ SOLN
INTRAMUSCULAR | Status: AC
  Filled 2017-06-15: qty 2

## 2017-06-15 MED ORDER — GABAPENTIN 300 MG PO CAPS
300.0000 mg | ORAL_CAPSULE | Freq: Once | ORAL | Status: AC
  Administered 2017-06-15: 300 mg via ORAL

## 2017-06-15 MED ORDER — ACETAMINOPHEN 500 MG PO TABS
1000.0000 mg | ORAL_TABLET | Freq: Once | ORAL | Status: AC
  Administered 2017-06-15: 1000 mg via ORAL

## 2017-06-15 MED ORDER — EPHEDRINE SULFATE 50 MG/ML IJ SOLN
INTRAMUSCULAR | Status: DC | PRN
  Administered 2017-06-15: 15 mg via INTRAVENOUS

## 2017-06-15 MED ORDER — DEXAMETHASONE SODIUM PHOSPHATE 4 MG/ML IJ SOLN
INTRAMUSCULAR | Status: DC | PRN
  Administered 2017-06-15: 10 mg via INTRAVENOUS

## 2017-06-15 MED ORDER — PROPOFOL 10 MG/ML IV BOLUS
INTRAVENOUS | Status: DC | PRN
  Administered 2017-06-15: 200 mg via INTRAVENOUS

## 2017-06-15 MED ORDER — ROCURONIUM BROMIDE 100 MG/10ML IV SOLN
INTRAVENOUS | Status: DC | PRN
  Administered 2017-06-15: 50 mg via INTRAVENOUS

## 2017-06-15 MED ORDER — GABAPENTIN 300 MG PO CAPS
ORAL_CAPSULE | ORAL | Status: AC
  Filled 2017-06-15: qty 1

## 2017-06-15 MED ORDER — ACETAMINOPHEN 500 MG PO TABS
ORAL_TABLET | ORAL | Status: AC
  Filled 2017-06-15: qty 2

## 2017-06-15 MED ORDER — SUGAMMADEX SODIUM 200 MG/2ML IV SOLN
INTRAVENOUS | Status: DC | PRN
  Administered 2017-06-15: 150 mg via INTRAVENOUS

## 2017-06-15 MED ORDER — SCOPOLAMINE 1 MG/3DAYS TD PT72
1.0000 | MEDICATED_PATCH | Freq: Once | TRANSDERMAL | Status: DC
  Administered 2017-06-15: 1.5 mg via TRANSDERMAL

## 2017-06-15 MED ORDER — CHLORHEXIDINE GLUCONATE 4 % EX LIQD: 1.0000 "application " | Freq: Once | CUTANEOUS | Status: DC

## 2017-06-15 MED ORDER — SCOPOLAMINE 1 MG/3DAYS TD PT72
MEDICATED_PATCH | TRANSDERMAL | Status: AC
  Filled 2017-06-15: qty 1

## 2017-06-15 MED ORDER — ONDANSETRON HCL 4 MG/2ML IJ SOLN
INTRAMUSCULAR | Status: DC | PRN
  Administered 2017-06-15: 4 mg via INTRAVENOUS

## 2017-06-15 MED ORDER — FENTANYL CITRATE (PF) 100 MCG/2ML IJ SOLN
INTRAMUSCULAR | Status: AC
  Filled 2017-06-15: qty 2

## 2017-06-15 MED ORDER — SODIUM CHLORIDE 0.9% FLUSH: 3.0000 mL | INTRAVENOUS | Status: DC | PRN

## 2017-06-15 MED ORDER — CIPROFLOXACIN IN D5W 400 MG/200ML IV SOLN
INTRAVENOUS | Status: AC
  Filled 2017-06-15: qty 200

## 2017-06-15 MED ORDER — ACETAMINOPHEN 325 MG PO TABS: 650.0000 mg | ORAL_TABLET | ORAL | Status: DC | PRN

## 2017-06-15 SURGICAL SUPPLY — 66 items
ADH SKN CLS APL DERMABOND .7 (GAUZE/BANDAGES/DRESSINGS) ×2
BAG DECANTER FOR FLEXI CONT (MISCELLANEOUS) ×3 IMPLANT
BINDER BREAST LRG (GAUZE/BANDAGES/DRESSINGS) ×2 IMPLANT
BINDER BREAST MEDIUM (GAUZE/BANDAGES/DRESSINGS) IMPLANT
BINDER BREAST XLRG (GAUZE/BANDAGES/DRESSINGS) IMPLANT
BINDER BREAST XXLRG (GAUZE/BANDAGES/DRESSINGS) IMPLANT
BIOPATCH RED 1 DISK 7.0 (GAUZE/BANDAGES/DRESSINGS) IMPLANT
BIOPATCH RED 1IN DISK 7.0MM (GAUZE/BANDAGES/DRESSINGS)
BLADE HEX COATED 2.75 (ELECTRODE) ×3 IMPLANT
BLADE SURG 15 STRL LF DISP TIS (BLADE) ×2 IMPLANT
BLADE SURG 15 STRL SS (BLADE) ×6
BNDG GAUZE ELAST 4 BULKY (GAUZE/BANDAGES/DRESSINGS) ×6 IMPLANT
CANISTER SUCT 1200ML W/VALVE (MISCELLANEOUS) ×3 IMPLANT
CHLORAPREP W/TINT 26ML (MISCELLANEOUS) ×3 IMPLANT
CORD BIPOLAR FORCEPS 12FT (ELECTRODE) IMPLANT
COVER BACK TABLE 60X90IN (DRAPES) ×3 IMPLANT
COVER MAYO STAND STRL (DRAPES) ×3 IMPLANT
DECANTER SPIKE VIAL GLASS SM (MISCELLANEOUS) IMPLANT
DERMABOND ADVANCED (GAUZE/BANDAGES/DRESSINGS) ×4
DERMABOND ADVANCED .7 DNX12 (GAUZE/BANDAGES/DRESSINGS) IMPLANT
DRAIN CHANNEL 19F RND (DRAIN) IMPLANT
DRAPE LAPAROSCOPIC ABDOMINAL (DRAPES) ×3 IMPLANT
DRSG PAD ABDOMINAL 8X10 ST (GAUZE/BANDAGES/DRESSINGS) ×8 IMPLANT
ELECT BLADE 4.0 EZ CLEAN MEGAD (MISCELLANEOUS) ×3
ELECT REM PT RETURN 9FT ADLT (ELECTROSURGICAL) ×3
ELECTRODE BLDE 4.0 EZ CLN MEGD (MISCELLANEOUS) ×1 IMPLANT
ELECTRODE REM PT RTRN 9FT ADLT (ELECTROSURGICAL) ×1 IMPLANT
EVACUATOR SILICONE 100CC (DRAIN) IMPLANT
GAUZE SPONGE 4X4 12PLY STRL LF (GAUZE/BANDAGES/DRESSINGS) IMPLANT
GLOVE BIO SURGEON STRL SZ 6.5 (GLOVE) ×4 IMPLANT
GLOVE BIO SURGEONS STRL SZ 6.5 (GLOVE) ×2
GOWN STRL REUS W/ TWL LRG LVL3 (GOWN DISPOSABLE) ×2 IMPLANT
GOWN STRL REUS W/TWL LRG LVL3 (GOWN DISPOSABLE) ×6
IMPL BREAST GEL MP 325CC (Breast) IMPLANT
IMPLANT BREAST GEL MP 325CC (Breast) ×6 IMPLANT
IV NS 1000ML (IV SOLUTION)
IV NS 1000ML BAXH (IV SOLUTION) IMPLANT
IV NS 500ML (IV SOLUTION)
IV NS 500ML BAXH (IV SOLUTION) IMPLANT
KIT FILL SYSTEM UNIVERSAL (SET/KITS/TRAYS/PACK) IMPLANT
NDL HYPO 25X1 1.5 SAFETY (NEEDLE) IMPLANT
NDL SAFETY ECLIPSE 18X1.5 (NEEDLE) ×1 IMPLANT
NEEDLE HYPO 18GX1.5 SHARP (NEEDLE) ×3
NEEDLE HYPO 25X1 1.5 SAFETY (NEEDLE) IMPLANT
PACK BASIN DAY SURGERY FS (CUSTOM PROCEDURE TRAY) ×3 IMPLANT
PENCIL BUTTON HOLSTER BLD 10FT (ELECTRODE) ×3 IMPLANT
PIN SAFETY STERILE (MISCELLANEOUS) IMPLANT
SIZER BREAST REUSE 325CC (SIZER) ×3
SIZER BRST REUSE P4.1 325CC (SIZER) IMPLANT
SLEEVE SCD COMPRESS KNEE MED (MISCELLANEOUS) ×3 IMPLANT
SPONGE LAP 18X18 X RAY DECT (DISPOSABLE) ×6 IMPLANT
SUT MNCRL AB 4-0 PS2 18 (SUTURE) ×3 IMPLANT
SUT MON AB 3-0 SH 27 (SUTURE) ×3
SUT MON AB 3-0 SH27 (SUTURE) ×1 IMPLANT
SUT MON AB 5-0 PS2 18 (SUTURE) ×3 IMPLANT
SUT PDS AB 2-0 CT2 27 (SUTURE) IMPLANT
SUT VIC AB 3-0 SH 27 (SUTURE)
SUT VIC AB 3-0 SH 27X BRD (SUTURE) IMPLANT
SUT VICRYL 4-0 PS2 18IN ABS (SUTURE) IMPLANT
SYR BULB IRRIGATION 50ML (SYRINGE) ×3 IMPLANT
SYR CONTROL 10ML LL (SYRINGE) IMPLANT
TOWEL OR 17X24 6PK STRL BLUE (TOWEL DISPOSABLE) ×6 IMPLANT
TUBE CONNECTING 20'X1/4 (TUBING) ×1
TUBE CONNECTING 20X1/4 (TUBING) ×2 IMPLANT
UNDERPAD 30X30 (UNDERPADS AND DIAPERS) ×6 IMPLANT
YANKAUER SUCT BULB TIP NO VENT (SUCTIONS) ×5 IMPLANT

## 2017-06-15 NOTE — Anesthesia Preprocedure Evaluation (Signed)
Anesthesia Evaluation  Patient identified by MRN, date of birth, ID band Patient awake    Reviewed: Allergy & Precautions, NPO status , Patient's Chart, lab work & pertinent test results  History of Anesthesia Complications (+) PONV and history of anesthetic complications  Airway Mallampati: II  TM Distance: >3 FB Neck ROM: Full    Dental  (+) Teeth Intact, Dental Advisory Given   Pulmonary asthma , former smoker,    Pulmonary exam normal breath sounds clear to auscultation       Cardiovascular hypertension, Pt. on medications + Peripheral Vascular Disease  Normal cardiovascular exam Rhythm:Regular Rate:Normal     Neuro/Psych PSYCHIATRIC DISORDERS Anxiety negative neurological ROS     GI/Hepatic negative GI ROS, Neg liver ROS,   Endo/Other  negative endocrine ROS  Renal/GU negative Renal ROS     Musculoskeletal negative musculoskeletal ROS (+)   Abdominal   Peds  Hematology negative hematology ROS (+)   Anesthesia Other Findings Day of surgery medications reviewed with the patient.  Reproductive/Obstetrics                             Anesthesia Physical Anesthesia Plan  ASA: II  Anesthesia Plan: General   Post-op Pain Management:    Induction: Intravenous  PONV Risk Score and Plan: 4 or greater and Ondansetron, Dexamethasone, Midazolam, Scopolamine patch - Pre-op and Promethazine  Airway Management Planned: Oral ETT  Additional Equipment:   Intra-op Plan:   Post-operative Plan: Extubation in OR  Informed Consent: I have reviewed the patients History and Physical, chart, labs and discussed the procedure including the risks, benefits and alternatives for the proposed anesthesia with the patient or authorized representative who has indicated his/her understanding and acceptance.   Dental advisory given  Plan Discussed with: CRNA  Anesthesia Plan Comments: (Risks/benefits  of general anesthesia discussed with patient including risk of damage to teeth, lips, gum, and tongue, nausea/vomiting, allergic reactions to medications, and the possibility of heart attack, stroke and death.  All patient questions answered.  Patient wishes to proceed.)        Anesthesia Quick Evaluation

## 2017-06-15 NOTE — H&P (View-Only) (Signed)
Krystal Brooks is an 61 y.o. female.   Chief Complaint: acquired absence of breasts. HPI: Krystal Brooks is a 61 yo female here for bilateral breast reconstruction.  She had tissue expanders and dermal matrix placement following bilateral mastectomies 03/16/17.  IMPLANTS: Left - Mentor Smooth 300cc with 340 cc saline  Right - Mentor Smooth 300cc with 340 cc saline History:   She was recently diagnosed with LEFT breast cancer.  She has been getting a mammogram every 6 months because of her right breast cancer last year.  She underwent a lumpectomy with radiation for treatment.  She now has invasive lobular carcinoma that is ER positive, PR negative and HER-2 negative.  The right side was also lobular carcinoma.  She was treated with Arimidex.  She is not sure about chemotherapy but leaning toward not undergoing it.   She is 5 feet 1 inch tall and weight is 128 pounds. Preop bra = 34 - 36 C. She was referred by Dr. Excell Seltzer.   Past Medical History:  Diagnosis Date  . Adie's pupil    LEFT EYE  . Allergy   . Anxiety   . Asthma   . Atherosclerosis of aorta (HCC)    bulge above renal arteries on mri 2016  . Cancer [facility]) 2017   right breast ca  . Complication of anesthesia    VERY HARD TO WAKE UP    . Hypertension   . Personal history of radiation therapy   . PONV (postoperative nausea and vomiting)   . Smoker     Past Surgical History:  Procedure Laterality Date  . ABDOMINAL HYSTERECTOMY    . BREAST BIOPSY Right 01/02/2016  . BREAST EXCISIONAL BIOPSY Right 1979  . BREAST LUMPECTOMY Right 01/27/2016  . BREAST LUMPECTOMY WITH RADIOACTIVE SEED AND SENTINEL LYMPH NODE BIOPSY Right 01/27/2016   Procedure: BREAST LUMPECTOMY WITH RADIOACTIVE SEED AND SENTINEL LYMPH NODE BIOPSY;  Surgeon: Excell Seltzer, MD;  Location: [place];  Service: General;  Laterality: Right;  . BREAST RECONSTRUCTION WITH PLACEMENT OF TISSUE EXPANDER AND FLEX HD (ACELLULAR HYDRATED DERMIS) Bilateral  03/16/2017   Procedure: BILATERAL IMMEDIATE BREAST RECONSTRUCTION WITH PLACEMENT OF TISSUE EXPANDER AND FLEX HD (ACELLULAR HYDRATED DERMIS);  Surgeon: Wallace Going, DO;  Location: [place];  Service: Plastics;  Laterality: Bilateral;  . BREAST SURGERY  1979   cyst removed rt breast  . CATARACT EXTRACTION W/ INTRAOCULAR LENS IMPLANT     RT EYE  . CESAREAN SECTION     x2  . MASTECTOMY W/ SENTINEL NODE BIOPSY Bilateral 03/16/2017   Procedure: BILATERAL TOTAL MASTECTOMY WITH LEFT SENTINEL LYMPH NODE BIOPSY;  Surgeon: Excell Seltzer, MD;  Location: [place];  Service: General;  Laterality: Bilateral;  . TOTAL MASTECTOMY Bilateral 03/16/2017   SENTINAL NODE BIOPSY    Family History  Problem Relation Age of Onset  . Hearing loss Mother   . Asthma Father   . Cancer Father        melanoma, metastases   . Diabetes Father   . Heart disease Father   . Early death Sister   . Early death Paternal Grandfather   . Cancer Maternal Grandmother 47       colon cancer    Social History:  reports that she has quit smoking. Her smoking use included Cigarettes. She has a 20.00 pack-year smoking history. She has never used smokeless tobacco. She reports that she drinks alcohol. She reports that she does not use drugs.  Allergies:  Allergies  Allergen Reactions  . Penicillins Swelling and Rash    Older sister died from reaction to penicillin Has patient had a PCN reaction causing immediate rash, facial/tongue/throat swelling, SOB or lightheadedness with hypotension: Yes Has patient had a PCN reaction causing severe rash involving mucus membranes or skin necrosis: No Has patient had a PCN reaction that required hospitalization: No Has patient had a PCN reaction occurring within the last 10 years: No If all of the above answers are "NO", then may proceed with Cephalosporin use.   . Sulfa Antibiotics Rash     (Not in a hospital admission)  No results found for this or any previous visit (from  the past 48 hour(s)). No results found.  Review of Systems  Constitutional: Negative.   HENT: Negative.   Eyes: Negative.   Respiratory: Negative.   Cardiovascular: Negative.   Gastrointestinal: Negative.   Genitourinary: Negative.   Musculoskeletal: Negative.   Skin: Negative.   Neurological: Negative.   Psychiatric/Behavioral: Negative.     There were no vitals taken for this visit. Physical Exam  Constitutional: She is oriented to person, place, and time. She appears well-developed and well-nourished.  HENT:  Head: Normocephalic and atraumatic.  Eyes: Pupils are equal, round, and reactive to light. EOM are normal.  Cardiovascular: Normal rate.   Respiratory: Effort normal.  GI: She exhibits no distension. There is no tenderness.  Neurological: She is alert and oriented to person, place, and time.  Skin: Skin is warm. No rash noted. No erythema. No pallor.  Psychiatric: She has a normal mood and affect. Judgment and thought content normal.     Assessment/Plan Plan for removal of expanders and placement of silicone implants.  CLAIRE S DILLINGHAM, DO 06/10/2017, 10:20 AM   

## 2017-06-15 NOTE — Addendum Note (Signed)
Addendum  created 06/15/17 1640 by Willa Frater, CRNA   Anesthesia Intra Meds edited

## 2017-06-15 NOTE — Discharge Instructions (Addendum)
No heavy lifting May shower tomorrow Continue binder or sports bra  Breast Cancer Survivor Follow-up The goal of treatment for breast cancer is to get rid of all cancer cells in the body, but sometimes a few cells remain. These cells can grow and cause the cancer to return (recur) later. If this happens, the goal is to find the cancer as soon as possible. Cancer can recur just a few months after treatment or years later. Most cases of recurrent breast cancer develop within 5 years after treatment. Will my cancer return? There is no way to know if your breast cancer will return. However, your chance of developing recurrent breast cancer is greater if you had:  Breast cancer before 62 years of age.  Breast cancer that spread to the lymph nodes.  A tumor that was bigger than 2 inches (5 cm).  A high-grade tumor. These are tumors that have cells that grow more quickly than other types of tumors.  A close tumor margin. This means that the space between the tumor and normal, noncancerous cells was small.  Inflammatory breast cancer. This is an aggressive form of cancer in which cancer cells block the lymph vessels in the skin of the breast.  Human epidermal growth factor (HER2) positive breast cancer. This is a type of cancer that grows as a result of the HER2 protein.  Surgery to remove the tumor but not the entire breast (lumpectomy) without radiation therapy.  What are the symptoms of recurrent breast cancer? Examine your breasts every month. You may find it helpful to do this on the same day each month. Mark your calendar as a reminder. Let your health care provider know immediately if you have any signs or symptoms of recurrent breast cancer. Signs and symptoms of recurrent breast cancer vary. Symptoms will depend on where the cancer is and how the original cancer was treated. Recurrence in the same spot or the opposite breast Symptoms of a cancer that comes back in the same spot (local  recurrence) after a lumpectomy, or a recurrence in the opposite breast, may include:  A new lump or thickening in the breast.  A change in the way that the skin of the breast looks, such as a rash, dimpling, or wrinkling.  Redness or swelling of the breast.  Changes in the nipple. It may be leaking fluid, or it may be red, puckered, or swollen.  Recurrence after a mastectomy Symptoms of a recurrence after breast removal surgery (mastectomy) may include:  A lump or thickening under the skin.  A thickening around the mastectomy scar.  Recurrence in the lymph nodes Symptoms of a cancer that comes back in the lymph nodes near the breast (regional recurrence) may include:  A lump under the arm or above the collarbone.  Swelling of the arm.  Pain in the arm, shoulder, or chest.  Numbness in the hand or arm.  Recurrence in a different part of the body Symptoms of cancer that comes back in an area of the body far away from the original cancer site (distant recurrence) may include:  A cough that does not go away.  Trouble breathing or shortness of breath.  Pain in the bones, the spine, or the chest. This is pain that lasts or does not improve with rest and medicine.  Headaches.  Sudden vision problems.  Dizziness.  Nausea or vomiting.  Weight loss.  Abdominal pain that does not go away.  Yellowing of the skin or eyes (jaundice).  Blood in the urine or bloody vaginal discharge.  Following up with your health care provider  Most people continue to see their cancer specialist (oncologist) every 3-6 months for the first year after cancer treatment. Ask about your cancer survivor plan of care that outlines your follow-up care after treatment ends. See your primary care provider for regular checkups during this time.  Ask your oncologist: ? How often you should have follow-up visits. ? What symptoms to watch for. ? What to do and whom to call about any symptoms. ? What  tests should be done.  Keep a schedule of appointments for the tests and exams that you need, including physical exams, breast exams, and exams of the lymph nodes.  For the first 3 years after being treated for breast cancer, see your health care provider every 3-6 months.  In the fourth and fifth years after being treated for breast cancer, see your health care provider every 6-12 months.  Starting at 5 years after your breast cancer treatment, see your health care provider at least once a year.  Continue to have regular breast X-rays (mammograms), even if you had a mastectomy. ? Get a mammogram 1 year after the mammogram that first detected breast cancer. ? Get a mammogram every 6-12 months after that or as often as your health care provider suggests.  Have a pelvic exam every year or as often as your health care provider suggests.  Have other cancer screening tests as recommended, such as skin cancer screening and colorectal cancer screening.  Some tests are not recommended for routine screening. Most people recovering from breast cancer do not need to have these tests if there are no problems. The tests have risks, such as radiation exposure, and can be costly. The risks of some of the following tests are thought to be greater than the benefits: ? Blood tests. ? Chest X-rays. ? Bone scans. ? Liver ultrasound. ? CT scan. ? MRI. ? Positron emission tomography (PET scan).  Where to find more information:  American Cancer Society: www.cancer.[place]: www.cancer.gov Contact a health care provider if:  You have any signs or symptoms of recurrent breast cancer.  You are taking a medicine prescribed to treat your breast cancer and you have vaginal bleeding.  You discover new lumps or changes in your breast.  You have headaches or have pain in your bones, spine, chest, or abdomen.  You have shortness of breath.  You have a cough that does not go  away.  You have discharge from your nipple.  You have a rash on your breast. Get help right away if:  You have trouble breathing.  You have chest pain. Summary  Most cases of recurrent breast cancer develop within 5 years after treatment.  Examine your breasts every month. Let your health care provider know immediately if you have any signs or symptoms of recurrent breast cancer.  Keep a schedule of appointments for the tests and exams that you need, including physical exams, breast exams, and exams of the lymph nodes.  Continue to have regular breast X-rays (mammograms), even if you had a mastectomy. This information is not intended to replace advice given to you by your health care provider. Make sure you discuss any questions you have with your health care provider. Document Released: 04/14/2011 Document Revised: 08/05/2016 Document Reviewed: 08/05/2016   Post Anesthesia Home Care Instructions  Activity: Get plenty of rest for the remainder of the day. A responsible individual  must stay with you for 24 hours following the procedure.  For the next 24 hours, DO NOT: -Drive a car -Paediatric nurse -Drink alcoholic beverages -Take any medication unless instructed by your physician -Make any legal decisions or sign important papers.  Meals: Start with liquid foods such as gelatin or soup. Progress to regular foods as tolerated. Avoid greasy, spicy, heavy foods. If nausea and/or vomiting occur, drink only clear liquids until the nausea and/or vomiting subsides. Call your physician if vomiting continues.  Special Instructions/Symptoms: Your throat may feel dry or sore from the anesthesia or the breathing tube placed in your throat during surgery. If this causes discomfort, gargle with warm salt water. The discomfort should disappear within 24 hours.  If you had a scopolamine patch placed behind your ear for the management of post- operative nausea and/or vomiting:  1. The  medication in the patch is effective for 72 hours, after which it should be removed.  Wrap patch in a tissue and discard in the trash. Wash hands thoroughly with soap and water. 2. You may remove the patch earlier than 72 hours if you experience unpleasant side effects which may include dry mouth, dizziness or visual disturbances. 3. Avoid touching the patch. Wash your hands with soap and water after contact with the patch.    Chartered certified accountant Patient Education  [employer].

## 2017-06-15 NOTE — Interval H&P Note (Signed)
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 After consideration of risks, benefits and other options for treatment, the patient has consented to  Procedure(s): REMOVAL OF BILATERAL TISSUE EXPANDERS WITH PLACEMENT OF BILATERAL BREAST IMPLANTS (Bilateral) as a surgical intervention .  The patient's history has been reviewed, patient examined, no change in status, stable for surgery.  I have reviewed the patient's chart and labs.  Questions were answered to the patient's satisfaction.     Wallace Going

## 2017-06-15 NOTE — Anesthesia Postprocedure Evaluation (Signed)
Anesthesia Post Note  Patient: [name]  Procedure(s) Performed: REMOVAL OF BILATERAL TISSUE EXPANDERS WITH PLACEMENT OF BILATERAL BREAST IMPLANTS (Bilateral )     Patient location during evaluation: PACU Anesthesia Type: General Level of consciousness: awake and alert Pain management: pain level controlled Vital Signs Assessment: post-procedure vital signs reviewed and stable Respiratory status: spontaneous breathing, nonlabored ventilation and respiratory function stable Cardiovascular status: blood pressure returned to baseline and stable Postop Assessment: no apparent nausea or vomiting Anesthetic complications: no    Last Vitals:  Vitals:   06/15/17 1600 06/15/17 1615  BP: 123/66 (!) 113/58  Pulse: 95 93  Resp: (!) 25 13  Temp:    SpO2: 96% 98%    Last Pain:  Vitals:   06/15/17 1615  TempSrc:   PainSc: 0-No pain                 Catalina Gravel

## 2017-06-15 NOTE — Op Note (Signed)
Op report Bilateral Exchange   DATE OF OPERATION: 06/15/2017  LOCATION: [facility]  SURGICAL DIVISION: Plastic Surgery  PREOPERATIVE DIAGNOSES:  1. History of right breast cancer.  2. Acquired absence of bilateral breast.   POSTOPERATIVE DIAGNOSES:  1. History of right breast cancer.  2. Acquired absence of bilateral breast.   PROCEDURE:  1. Bilateral exchange of tissue expanders for implants.  2. Bilateral capsulotomies for implant respositioning.  SURGEON: Deirdra Heumann Sanger Marybella Ethier, DO  ASSISTANT: Shawn Rayburn, PA  ANESTHESIA:  General.   COMPLICATIONS: None.   IMPLANTS: Left - Mentor Smooth Moderate Plus Profile Xtra 325 cc. Ref #[id number].  Serial Number [id number] Right - Mentor Smooth Moderate Plus Profile Xtra 325 cc. Ref #[id number].  Serial Number [id number]  INDICATIONS FOR PROCEDURE:  The patient, Krystal Brooks, is a 61 y.o. female born on June 07, 1956, is here for treatment after bilateral mastectomies.  She had tissue expanders placed at the time of mastectomies. She now presents for exchange of her expanders for implants.  She requires capsulotomies to better position the implants. MRN: [id number]  CONSENT:  Informed consent was obtained directly from the patient. Risks, benefits and alternatives were fully discussed. Specific risks including but not limited to bleeding, infection, hematoma, seroma, scarring, pain, implant infection, implant extrusion, capsular contracture, asymmetry, wound healing problems, and need for further surgery were all discussed. The patient did have an ample opportunity to have her questions answered to her satisfaction.   DESCRIPTION OF PROCEDURE:  The patient was taken to the operating room. SCDs were placed and IV antibiotics were given. The patient's chest was prepped and draped in a sterile fashion. A time out was performed and the implants to be used were identified.    On the right breast: One percent  Lidocaine with epinephrine was used to infiltrate at the incision site. The old mastectomy scar was excised.  The mastectomy flaps from the superior and inferior flaps were raised over the pectoralis major muscle for several centimeters to minimize tension for the closure. The pectoralis was split inferior to the skin incision to expose and remove the tissue expander.  Inspection of the pocket showed a normal healthy capsule and moderate integration of the biologic matrix.  The pocket was irrigated with antibiotic solution.  Circumferential capsulotomies were performed to allow for breast pocket expansion.  A lateral portion of the capsule was excised to help release the scar contracture.  This was sent to path. Measurements were made and a sizer used to confirm adequate pocket size for the implant dimensions.  Hemostasis was ensured with electrocautery. New gloves were placed. The implant was soaked in saline and then placed in the pocket and oriented appropriately. The pectoralis major muscle and capsule on the anterior surface were re-closed with a 3-0 Monocryl suture. The remaining skin was closed with 4-0 Monocryl deep dermal and 5-0 Monocryl subcuticular stitches.   On the left breast: The old mastectomy scar was excised.  The mastectomy flaps from the superior and inferior flaps were raised over the pectoralis major muscle for several centimeters to minimize tension for the closure. The pectoralis was split inferior to the skin incision to expose and remove the tissue expander.  Inspection of the pocket showed a normal healthy capsule and good integration of the biologic matrix.   Circumferential capsulotomies were performed to allow for breast pocket expansion.  Measurements were made and a sizer utilized to confirm adequate pocket size for the implant dimensions.  Hemostasis was ensured with  the electrocautery.  New gloves were applied. The implant was soaked in saline and placed in the pocket and  oriented appropriately. The pectoralis major muscle and capsule on the anterior surface were re-closed with a 3-0 Monocryl suture. The remaining skin was closed with 4-0 Monocryl deep dermal and 5-0 Monocryl subcuticular stitches.  Dermabond was applied to the incision site. A breast binder and ABDs were placed.  The patient was allowed to wake from anesthesia and taken to the recovery room in satisfactory condition.

## 2017-06-15 NOTE — Transfer of Care (Signed)
Immediate Anesthesia Transfer of Care Note  Patient: [name]  Procedure(s) Performed: REMOVAL OF BILATERAL TISSUE EXPANDERS WITH PLACEMENT OF BILATERAL BREAST IMPLANTS (Bilateral )  Patient Location: PACU  Anesthesia Type:General  Level of Consciousness: awake, alert , oriented and drowsy  Airway & Oxygen Therapy: Patient Spontanous Breathing and Patient connected to face mask oxygen  Post-op Assessment: Report given to RN and Post -op Vital signs reviewed and stable  Post vital signs: Reviewed and stable  Last Vitals:  Vitals:   06/15/17 1233 06/15/17 1539  BP: 128/84 (!) 126/54  Pulse: 84   Resp: 18   Temp: 36.6 C   SpO2: 99%     Last Pain:  Vitals:   06/15/17 1233  TempSrc: Oral  PainSc: 0-No pain      Patients Stated Pain Goal: 0 (36/[id number]/80 [id number])  Complications: No apparent anesthesia complications

## 2017-06-15 NOTE — Anesthesia Procedure Notes (Signed)
Procedure Name: Intubation Date/Time: 06/15/2017 2:14 PM Performed by: Melynda Ripple D Pre-anesthesia Checklist: Patient identified, Emergency Drugs available, Suction available and Patient being monitored Patient Re-evaluated:Patient Re-evaluated prior to induction Oxygen Delivery Method: Circle system utilized Preoxygenation: Pre-oxygenation with 100% oxygen Induction Type: IV induction Ventilation: Mask ventilation without difficulty Laryngoscope Size: Mac and 3 Grade View: Grade I Tube type: Oral Tube size: 7.0 mm Number of attempts: 1 Airway Equipment and Method: Stylet and Oral airway Placement Confirmation: ETT inserted through vocal cords under direct vision,  positive ETCO2 and breath sounds checked- equal and bilateral Secured at: 22 cm Tube secured with: Tape Dental Injury: Teeth and Oropharynx as per pre-operative assessment

## 2017-06-16 ENCOUNTER — Encounter (HOSPITAL_BASED_OUTPATIENT_CLINIC_OR_DEPARTMENT_OTHER): Payer: Self-pay | Admitting: Plastic Surgery

## 2017-06-16 NOTE — Addendum Note (Signed)
Addendum  created 06/16/17 1340 by Tawni Millers, CRNA   Charge Capture section accepted

## 2017-06-24 DIAGNOSIS — Z9889 Other specified postprocedural states: Secondary | ICD-10-CM | POA: Insufficient documentation

## 2017-07-12 NOTE — Progress Notes (Signed)
[place]  [phone] [phone]     ID: Krystal Brooks DOB: 02-13-1956  MR#: [id number]  GBM#:[id number]  Patient Care Team: Susy Frizzle, MD as PCP - General (Family Medicine) Excell Seltzer, MD as Consulting Physician (General Surgery) Druscilla Brownie, MD as Consulting Physician (Dermatology) Dillingham, [name], DO as Attending Physician (Plastic Surgery) Magrinat, Virgie Dad, MD as Consulting Physician (Oncology) Delice Bison, Charlestine Massed, NP as Nurse Practitioner (Hematology and Oncology) OTHER MD:  CHIEF COMPLAINT: Recurrent estrogen receptor positive breast cancer  CURRENT TREATMENT: Tamoxifen  INTERVAL HISTORY: Krystal Brooks returns today for follow-up and treatment of her estrogen receptor positive breast cancer.  She started tamoxifen on September 1st, 2018. She is taking it with good tolerance. She denies hot flashes and vaginal discharge.   Since her last visit to the office, she underwent bilateral breast implant surgery on 06/15/2017 performed by Dr. Marla Roe. Subsequently, she had a biopsy completed on 06/15/2017 that showed ([id number]) Scar -, Right Mastectomy with benign skin with scar, including foreign body giant cell reaction. Breast, capsule, Right with inflamed fibroadipose tissue. There is no evidence of malignancy. Scar -, left mastectomy with benign skin with scar, including foreign body giant cell reaction.   REVIEW OF SYSTEMS: Krystal Brooks reports that her breast reconstruction surgery went well, but she is less than satisfied with the results. She reports that her right breast feels very tight and she has some pain in the lateral aspect of her chest and back. She reports that she will be going to physical therapy for scar tissue under her right arm. For exercise, she completes stretching and wall crawling exercises during physical therapy. She also tries to take 30-45 minute walks a few times a week. She denies unusual headaches,  visual changes, nausea, vomiting, or dizziness. There has been no unusual cough, phlegm production, or pleurisy. This been no change in bowel or bladder habits. She denies unexplained fatigue or unexplained weight loss, bleeding, rash, or fever. A detailed review of systems was otherwise stable.   BREAST CANCER HISTORY: From Dr Ernestina Penna 01/14/16 note:  "Krystal Brooks 61 y.o. female is here because of Her newly diagnosed right breast cancer. She is accompanied by her husband and of friend to our multidisciplinary rest clinic today.  This was found by screening mammogram,  she denies any palpable mass, skin change or nipple discharge, or ulcer constitutional symptoms before the screening.  Her prior mammo 6 years ago. She had a right breast cyst removed in 1979. "  Malignant neoplasm of lower-inner quadrant of right breast of female, estrogen receptor positive ([place])   12/29/2015 Mammogram    Diagnostic right mammogram showed a 2.3 x 2.1 x 2.2 cm group of coarse calcification within the medial slightly lower right breast.      01/02/2016 Initial Diagnosis    Breast cancer of lower-inner quadrant of right female breast ([place])      01/02/2016 Initial Biopsy    right breast LIQ core needle biopsy showed LCIS, with a small focus of invasive lobular carcinoma, grade 2.       01/02/2016 Receptors her2    ER 10% positive, moderate staining. PR negative. Insufficient tissue for HER-2 testing.      01/27/2016 Surgery    Right breast lumpectomy and sentinel lymph node biopsy ([place])      01/27/2016 Pathology Results    Right breast lumpectomy showed pleomorphic variant of lobular carcinoma in situ, and scattered microscopic foci of invasive lobular carcinoma (all less  than 0.1 cm) in different sections, total 0.6 cm. 0/2 SLN.       01/27/2016 Receptors her2    The surgical sample of the invasive carcinoma was not sufficient for Her2 test and other additional studies.        03/09/2016 - 04/19/2016 Radiation Therapy    Adjuvant breast radiation [name]). Right breast: 50 Gy in 25 fractions. Right breast "boost": 10 Gy in 5 fractions.        04/2016 -  Anti-estrogen oral therapy    Anastrozole 1 mg daily. Planned duration of therapy: 5 years        PAST MEDICAL HISTORY: Past Medical History:  Diagnosis Date  . Adie's pupil    LEFT EYE  . Allergy   . Anxiety   . Asthma   . Atherosclerosis of aorta (HCC)    bulge above renal arteries on mri 2016  . Cancer [facility]) 2017   right breast ca  . Complication of anesthesia    VERY HARD TO WAKE UP    . Hypertension   . Personal history of radiation therapy   . PONV (postoperative nausea and vomiting)   . Smoker     PAST SURGICAL HISTORY: Past Surgical History:  Procedure Laterality Date  . ABDOMINAL HYSTERECTOMY    . BREAST BIOPSY Right 01/02/2016  . BREAST EXCISIONAL BIOPSY Right 1979  . BREAST LUMPECTOMY Right 01/27/2016  . BREAST SURGERY  1979   cyst removed rt breast  . CATARACT EXTRACTION W/ INTRAOCULAR LENS IMPLANT     RT EYE  . CESAREAN SECTION     x2  . TOTAL MASTECTOMY Bilateral 03/16/2017   SENTINAL NODE BIOPSY    FAMILY HISTORY Family History  Problem Relation Age of Onset  . Hearing loss Mother   . Asthma Father   . Cancer Father        melanoma, metastases   . Diabetes Father   . Heart disease Father   . Early death Sister   . Early death Paternal Grandfather   . Cancer Maternal Grandmother 23       colon cancer   The patient's father died at age 15. She had been diagnosed with melanoma in 1 year. That was not the cause of death. The patient's mother is living at age 30 as of May 2018. The patient has one brother, no sisters. There is no history of breast or ovarian cancer in the family, and no other melanoma history.  GYNECOLOGIC HISTORY:  No LMP recorded. Patient has had a hysterectomy. 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 She frequently keeps her 2 grandchildren. Her husband Clair Gulling is a retired Airline pilot. He now owns a business where they prepare the front car of a trailer hoarse hitch combination. Daughter April lives in [place] where she works as a Statistician. Daughter Ailene Ravel lives in [place] where she is an Forensic scientist for [employer]. The patient has 4 grandchildren. She is a Tourist information centre manager.    ADVANCED DIRECTIVES:    HEALTH MAINTENANCE: Social History   Tobacco Use  . Smoking status: Former Smoker    Packs/day: 0.50    Years: 40.00    Pack years: 20.00    Types: Cigarettes  . Smokeless tobacco: Never Used  Substance Use Topics  .  Alcohol use: Yes    Alcohol/week: 0.0 oz    Comment: social  . Drug use: No     Colonoscopy:  PAP:  Bone density:   Allergies  Allergen Reactions  . Penicillins Swelling and Rash    Older sister died from reaction to penicillin Has patient had a PCN reaction causing immediate rash, facial/tongue/throat swelling, SOB or lightheadedness with hypotension: Yes Has patient had a PCN reaction causing severe rash involving mucus membranes or skin necrosis: No Has patient had a PCN reaction that required hospitalization: No Has patient had a PCN reaction occurring within the last 10 years: No If all of the above answers are "NO", then may proceed with Cephalosporin use.   . Sulfa Antibiotics Rash    Current Outpatient Medications  Medication Sig Dispense Refill  . acetaminophen (TYLENOL) 500 MG tablet Take 1,500 mg by mouth daily as needed for moderate pain or headache.    . albuterol (VENTOLIN HFA) 108 (90 Base) MCG/ACT inhaler USE 1-2 PUFFS EVERY 6 HOURS AS NEEDED FOR WHEEZING 18 g 5  . ANORO ELLIPTA 62.5-25 MCG/INH AEPB INHALE 1 PUFF INTO THE LUNGS DAILY (Patient taking  differently: Inhale 1 puff into the lungs every evening. ) 60 each 10  . Ascorbic Acid (VITAMIN C) 1000 MG tablet Take 1,000 mg by mouth daily.    . diazepam (VALIUM) 2 MG tablet Take 2 mg by mouth every 6 (six) hours as needed for anxiety.    Marland Kitchen ibuprofen (ADVIL,MOTRIN) 200 MG tablet Take 600 mg by mouth daily as needed for moderate pain.    Marland Kitchen losartan (COZAAR) 100 MG tablet Take 1 tablet (100 mg total) by mouth daily. 90 tablet 3  . montelukast (SINGULAIR) 10 MG tablet TAKE 1 TABLET BY MOUTH EVERY DAY (Patient taking differently: TAKE 1 TABLET BY MOUTH EVERY DAY AT BEDTIME) 90 tablet 3  . Multiple Vitamin (MULTIVITAMIN WITH MINERALS) TABS tablet Take 1 tablet by mouth daily.    . tamoxifen (NOLVADEX) 20 MG tablet Take by mouth.    . triamterene-hydrochlorothiazide (MAXZIDE-25) 37.5-25 MG tablet Take 1 tablet by mouth daily. Stop amlodipine 90 tablet 3   No current facility-administered medications for this visit.     OBJECTIVE: Middle-aged white Brooks in no acute distress  Vitals:   07/13/17 1051  BP: (!) 152/67  Pulse: 93  Resp: 18  Temp: 97.8 F (36.6 C)  SpO2: 100%     Body mass index is 23.41 kg/m.    ECOG FS:1 - Symptomatic but completely ambulatory  Sclerae unicteric, EOMs intact Oropharynx clear and moist No cervical or supraclavicular adenopathy Lungs no rales or rhonchi Heart regular rate and rhythm Abd soft, nontender, positive bowel sounds MSK no focal spinal tenderness, no upper extremity lymphedema Neuro: nonfocal, well oriented, appropriate affect Breasts: Status post bilateral mastectomies with implant reconstruction she is also status post radiation on the right side.  The cosmetic result on the right side is poor.  The left side is "acceptable" per the patient.  There is no evidence of local recurrence.  Both axillae are benign.  LAB RESULTS:  CMP     Component Value Date/Time   NA 136 06/07/2017 0934   NA 133 (L) 01/03/2017 0922   K 3.7 06/07/2017 0934    K 3.4 (L) 01/03/2017 0922   CL 100 06/07/2017 0934   CO2 29 06/07/2017 0934   CO2 29 01/03/2017 0922   GLUCOSE 109 (H) 06/07/2017 0934   GLUCOSE 114 01/03/2017 7591  BUN 18 06/07/2017 0934   BUN 10.8 01/03/2017 0922   CREATININE 0.78 06/07/2017 0934   CREATININE 0.7 01/03/2017 0922   CALCIUM 9.1 06/07/2017 0934   CALCIUM 9.5 01/03/2017 0922   PROT 7.1 01/03/2017 0922   ALBUMIN 3.8 01/03/2017 0922   AST 18 01/03/2017 0922   ALT 19 01/03/2017 0922   ALKPHOS 60 01/03/2017 0922   BILITOT 0.47 01/03/2017 0922   GFRNONAA 85 05/10/2017 1445   GFRAA 99 05/10/2017 1445    No results found for: TOTALPROTELP, ALBUMINELP, A1GS, A2GS, BETS, BETA2SER, GAMS, MSPIKE, SPEI  No results found for: Nils Pyle, [employer]  Lab Results  Component Value Date   WBC 6.0 07/13/2017   NEUTROABS 4.4 07/13/2017   HGB 11.4 (L) 07/13/2017   HCT 34.4 (L) 07/13/2017   MCV 85.1 07/13/2017   PLT 210 07/13/2017      Chemistry      Component Value Date/Time   NA 136 06/07/2017 0934   NA 133 (L) 01/03/2017 0922   K 3.7 06/07/2017 0934   K 3.4 (L) 01/03/2017 0922   CL 100 06/07/2017 0934   CO2 29 06/07/2017 0934   CO2 29 01/03/2017 0922   BUN 18 06/07/2017 0934   BUN 10.8 01/03/2017 0922   CREATININE 0.78 06/07/2017 0934   CREATININE 0.7 01/03/2017 0922      Component Value Date/Time   CALCIUM 9.1 06/07/2017 0934   CALCIUM 9.5 01/03/2017 0922   ALKPHOS 60 01/03/2017 0922   AST 18 01/03/2017 0922   ALT 19 01/03/2017 0922   BILITOT 0.47 01/03/2017 0922       No results found for: LABCA2  No components found for: [id number]  No results for input(s): INR in the last 168 hours.  Urinalysis No results found for: COLORURINE, APPEARANCEUR, LABSPEC, PHURINE, GLUCOSEU, HGBUR, BILIRUBINUR, KETONESUR, PROTEINUR, UROBILINOGEN, NITRITE, LEUKOCYTESUR   STUDIES: Surgical results reviewed with the patient  ELIGIBLE FOR AVAILABLE RESEARCH PROTOCOL: no  ASSESSMENT: 61 y.o.  Krystal Brooks, with bilateral breast cancer  (1) status post right lumpectomy and sentinel lymph node sampling 01/27/2016 for lobular carcinoma in situ, pleomorphic variant, with multiple foci of invasive lobular carcinoma, all less than a millimeter, but with negative margins and both sentinel lymph nodes negative, invasive disease being estrogen receptor positive, HER-2 not tested (pT1 [mic] pN0}  (2) adjuvant radiation 03/09/2016 through 04/19/2016 The patient initially received a dose of 50Gy in 40factions to the breast using whole-breast tangent fields. This was delivered using a 3-D conformal technique. The patient then received a boost to the seroma. This delivered an additional 10Gy in 538fctions using an en face electron field due to the depth of the seroma. The total dose was 60Gy.  (3) anastrozole started June 2017  (4) status post left breast lower inner quadrant biopsy 12/29/2016 for a clinical T1c N0  invasive lobular breast cancer, grade 2, estrogen receptor positive, progesterone receptor negative HER-2 nonamplified, with a signals ratio 1.35 and the number per cell 2.70.  (5) intermediate Oncotype score of 22 predicts a 10 year risk of recurrence outside the breast of 14% if the patient's only systemic therapy is tamoxifen for 5 years.  (a) the patient opted against chemotherapy (TLovena Lex results confirm choice)  (6) status post bilateral mastectomies and left sentinel lymph node sampling 03/16/2017 showing  (a) on the right, no evidence of carcinoma  (b) on the left, residual pT2 pN0, stage IIA invasive lobular carcinoma, grade 2, with negative margins.  (7) had immediate expander placement  at the time of bilateral mastectomies  (a) definitive implant placement with bilateral capsulotomies June 15, 2017, with benign pathology  (8) started tamoxifen 04/30/2017  PLAN: Krystal Brooks is done with local treatment.  She is also status post reconstruction although she is  not satisfied with that.  She is interested in seeking a second opinion.  In terms of systemic therapy, she is tolerating tamoxifen remarkably well and the plan will be to continue that a minimum of 5 years.  We discussed the exercises that she needs to do to regain a complete use of her upper extremity range of motion.  She also needs to improve on her cardio program  Otherwise she will return to see [place] in 6 months.  She knows to call for any other issues that may develop before the next Magrinat, Virgie Dad, MD  07/13/17 11:03 AM Medical Oncology and Hematology [facility] [place], [place] [address] Tel. [phone]    Fax. [phone]    This document serves as a record of services personally performed by Lurline Del, MD. It was created on his behalf by Sheron Nightingale, a trained medical scribe. The creation of this record is based on the scribe's personal observations and the provider's statements to them.    I have reviewed the above documentation for accuracy and completeness, and I agree with the above.

## 2017-07-13 ENCOUNTER — Ambulatory Visit (HOSPITAL_BASED_OUTPATIENT_CLINIC_OR_DEPARTMENT_OTHER): Payer: 59 | Admitting: Oncology

## 2017-07-13 ENCOUNTER — Other Ambulatory Visit (HOSPITAL_BASED_OUTPATIENT_CLINIC_OR_DEPARTMENT_OTHER): Payer: 59

## 2017-07-13 ENCOUNTER — Telehealth: Payer: Self-pay | Admitting: Oncology

## 2017-07-13 VITALS — BP 152/67 | HR 93 | Temp 97.8°F | Resp 18 | Ht 61.0 in | Wt 123.9 lb

## 2017-07-13 DIAGNOSIS — C50311 Malignant neoplasm of lower-inner quadrant of right female breast: Secondary | ICD-10-CM | POA: Diagnosis not present

## 2017-07-13 DIAGNOSIS — Z170 Estrogen receptor positive status [ER+]: Secondary | ICD-10-CM

## 2017-07-13 DIAGNOSIS — C50212 Malignant neoplasm of upper-inner quadrant of left female breast: Secondary | ICD-10-CM | POA: Diagnosis not present

## 2017-07-13 LAB — COMPREHENSIVE METABOLIC PANEL
ALT: 14 U/L (ref 0–55)
ANION GAP: 8 meq/L (ref 3–11)
AST: 15 U/L (ref 5–34)
Albumin: 3.5 g/dL (ref 3.5–5.0)
Alkaline Phosphatase: 73 U/L (ref 40–150)
BUN: 15.4 mg/dL (ref 7.0–26.0)
CHLORIDE: 101 meq/L (ref 98–109)
CO2: 27 mEq/L (ref 22–29)
Calcium: 9.3 mg/dL (ref 8.4–10.4)
Creatinine: 0.8 mg/dL (ref 0.6–1.1)
Glucose: 94 mg/dl (ref 70–140)
Potassium: 4.3 mEq/L (ref 3.5–5.1)
Sodium: 137 mEq/L (ref 136–145)
Total Bilirubin: 0.22 mg/dL (ref 0.20–1.20)
Total Protein: 7.7 g/dL (ref 6.4–8.3)

## 2017-07-13 LAB — CBC WITH DIFFERENTIAL/PLATELET
BASO%: 0.5 % (ref 0.0–2.0)
BASOS ABS: 0 10*3/uL (ref 0.0–0.1)
EOS ABS: 0.1 10*3/uL (ref 0.0–0.5)
EOS%: 1.5 % (ref 0.0–7.0)
HEMATOCRIT: 34.4 % — AB (ref 34.8–46.6)
HEMOGLOBIN: 11.4 g/dL — AB (ref 11.6–15.9)
LYMPH#: 1 10*3/uL (ref 0.9–3.3)
LYMPH%: 17.4 % (ref 14.0–49.7)
MCH: 28.3 pg (ref 25.1–34.0)
MCHC: 33.3 g/dL (ref 31.5–36.0)
MCV: 85.1 fL (ref 79.5–101.0)
MONO#: 0.4 10*3/uL (ref 0.1–0.9)
MONO%: 7.3 % (ref 0.0–14.0)
NEUT#: 4.4 10*3/uL (ref 1.5–6.5)
NEUT%: 73.3 % (ref 38.4–76.8)
Platelets: 210 10*3/uL (ref 145–400)
RBC: 4.04 10*6/uL (ref 3.70–5.45)
RDW: 14.3 % (ref 11.2–14.5)
WBC: 6 10*3/uL (ref 3.9–10.3)

## 2017-07-13 NOTE — Telephone Encounter (Signed)
Gave patient avs and calendar with appts per 11/14 los.  °

## 2017-07-20 ENCOUNTER — Ambulatory Visit: Payer: 59 | Attending: Plastic Surgery | Admitting: Physical Therapy

## 2017-07-20 DIAGNOSIS — M25511 Pain in right shoulder: Secondary | ICD-10-CM | POA: Diagnosis present

## 2017-07-20 DIAGNOSIS — M25611 Stiffness of right shoulder, not elsewhere classified: Secondary | ICD-10-CM | POA: Diagnosis present

## 2017-07-20 DIAGNOSIS — L599 Disorder of the skin and subcutaneous tissue related to radiation, unspecified: Secondary | ICD-10-CM | POA: Diagnosis present

## 2017-07-20 DIAGNOSIS — R293 Abnormal posture: Secondary | ICD-10-CM | POA: Diagnosis not present

## 2017-07-20 DIAGNOSIS — M25612 Stiffness of left shoulder, not elsewhere classified: Secondary | ICD-10-CM

## 2017-07-20 NOTE — Therapy (Addendum)
[place], [place], [address] Phone: [phone]   Fax:  [phone]  Physical Therapy Treatment  Patient Details  Name: Krystal Brooks MRN: [id number] Date of Birth: Oct 15, 1955 Referring Provider: Marla Roe   Encounter Date: 07/20/2017  PT End of Session - 07/20/17 0941    Visit Number  12    Number of Visits  20    Date for PT Re-Evaluation  08/29/17    PT Start Time  0846    PT Stop Time  0934    PT Time Calculation (min)  48 min    Activity Tolerance  Patient tolerated treatment well    Behavior During Therapy  [facility] for tasks assessed/performed       Past Medical History:  Diagnosis Date  . Adie's pupil    LEFT EYE  . Allergy   . Anxiety   . Asthma   . Atherosclerosis of aorta (HCC)    bulge above renal arteries on mri 2016  . Cancer [facility]) 2017   right breast ca  . Complication of anesthesia    VERY HARD TO WAKE UP    . Hypertension   . Personal history of radiation therapy   . PONV (postoperative nausea and vomiting)   . Smoker     Past Surgical History:  Procedure Laterality Date  . ABDOMINAL HYSTERECTOMY    . BREAST BIOPSY Right 01/02/2016  . BREAST EXCISIONAL BIOPSY Right 1979  . BREAST LUMPECTOMY Right 01/27/2016  . BREAST LUMPECTOMY WITH RADIOACTIVE SEED AND SENTINEL LYMPH NODE BIOPSY Right 01/27/2016   Procedure: BREAST LUMPECTOMY WITH RADIOACTIVE SEED AND SENTINEL LYMPH NODE BIOPSY;  Surgeon: Excell Seltzer, MD;  Location: [place];  Service: General;  Laterality: Right;  . BREAST RECONSTRUCTION WITH PLACEMENT OF TISSUE EXPANDER AND FLEX HD (ACELLULAR HYDRATED DERMIS) Bilateral 03/16/2017   Procedure: BILATERAL IMMEDIATE BREAST RECONSTRUCTION WITH PLACEMENT OF TISSUE EXPANDER AND FLEX HD (ACELLULAR HYDRATED DERMIS);  Surgeon: Wallace Going, DO;  Location: [place];  Service: Plastics;  Laterality: Bilateral;  . BREAST SURGERY  1979   cyst removed rt  breast  . CATARACT EXTRACTION W/ INTRAOCULAR LENS IMPLANT     RT EYE  . CESAREAN SECTION     x2  . MASTECTOMY W/ SENTINEL NODE BIOPSY Bilateral 03/16/2017   Procedure: BILATERAL TOTAL MASTECTOMY WITH LEFT SENTINEL LYMPH NODE BIOPSY;  Surgeon: Excell Seltzer, MD;  Location: [place];  Service: General;  Laterality: Bilateral;  . REMOVAL OF BILATERAL TISSUE EXPANDERS WITH PLACEMENT OF BILATERAL BREAST IMPLANTS Bilateral 06/15/2017   Procedure: REMOVAL OF BILATERAL TISSUE EXPANDERS WITH PLACEMENT OF BILATERAL BREAST IMPLANTS;  Surgeon: Wallace Going, DO;  Location: [place];  Service: Plastics;  Laterality: Bilateral;  . TOTAL MASTECTOMY Bilateral 03/16/2017   SENTINAL NODE BIOPSY    There were no vitals filed for this visit.  Subjective Assessment - 07/20/17 0848    Subjective  "I had surgery the 18th of October where they removed the expanders and put in the implants.  The left side is feeling as normal as it will; the right side doesn't look good doesn't feel good.  It was an epic fail."  The right side is the one where I had radiation.  She said there was not much blood flow there.  It's still just so sore.  It's very numb down both sides, maybe getting a little better. The discomfort is better than before the surgery.    Pertinent History  asthma, 2017 R breast cancer s/p lumpectomy, SLNB and radiation, 2018 L breast cancer s/p bilateral mastectomy and L SLNB bilateral reconstruction 03/16/2017.  Had exchange surgery with permanent implants 06/16/17.    Patient Stated Goals  try to get the chest muscle tightness loosened up; not having the ROM problems    Currently in Pain?  Yes    Pain Score  5     Pain Location  Breast    Pain Orientation  Right;Lower    Pain Descriptors / Indicators  Burning    Pain Frequency  Constant    Aggravating Factors   worse at end of day    Pain Relieving Factors  advil    Multiple Pain Sites  Yes    Pain Score  6    Pain Location   Flank    Pain Orientation  Right;Upper    Pain Descriptors / Indicators  Tender    Aggravating Factors   any pressure on the area    Pain Relieving Factors  nothing         [facility] PT Assessment - 07/20/17 0001      Observation/Other Assessments   Observations  bilateral breast areas show good healing of incisions; right upper outer breast looks bruised, and patient doesn't know if this is old or new.  Right breast sits higher on chest than left.      Posture/Postural Control   Posture/Postural Control  Postural limitations    Postural Limitations  Rounded Shoulders;Forward head      AROM   Right Shoulder Flexion  135 Degrees    Right Shoulder ABduction  183 Degrees    Left Shoulder Flexion  129 Degrees    Left Shoulder ABduction  175 Degrees      Palpation   Palpation comment  decreased soft tissue mobility all around bilat. chest; right flank shows a raised line of tightness that bothers the patient                       [facility] Adult PT Treatment/Exercise - 07/20/17 0001      Manual Therapy   Soft tissue mobilization  to bilat. chest incision areas    Myofascial Release  crosshands in horizontal on chest, in vertical on right chest/abdomen in supine; in left sidelying, crosshands from right upper arm to right lower flank    Passive ROM  to right shoulder into er; to left shoulder into er and flexion to tolerance                     [facility] Goals - 07/20/17 0949      CC Long Term Goal  #1   Title  Pt will be able to independently verbalize lymphedema risk reduction practices    Time  4    Period  Weeks    Status  On-going      CC Long Term Goal  #2   Title  Pt will demonstrate 150 degrees of bilateral shoulder flexion to allow pt to reach items on top shelf and reach up to wash her hair    Baseline  Rt. 135, left 129 on re-eval on 07/20/17    Time  4    Period  Weeks    Status  On-going      CC Long Term Goal  #3   Title  Pt to  demonstrate 150 degrees of bilateral shoulder abduction to allow pt to reach out  to sides    Status  Achieved      CC Long Term Goal  #4   Title  Pt to be independent in a home exercise program for continued strengthening and stretching.     Status  On-going      CC Long Term Goal  #5   Title  Pt. will report at least 50% decreased discomfort in right axilla/flank area    Time  4    Period  Weeks    Status  New         Plan - 07/20/17 0941    Clinical Impression Statement  Pt. returns to [place] now five weeks post-op for expander/implant exchange surgery on 06/14/17 for her breast cancer.  She feels better than prior to surgery, when she had significant discomfort and tightness, but still has discomfort and is not satisfied with the outcome of the right breast, though she went into surgery knowing that her having had radiation on the right in the past made this surgery complicated.  She does have decreased soft tissue mobility across both incisions and the right breast area feels very firm.  She has a ridge of raised tissue at right upper flank that bothers here, and this area is very limited in mobility.  She is holding her shoulders elevated and rounded forward.    Clinical Decision Making  -- This is a re-eval today.    Rehab Potential  Good    Clinical Impairments Affecting Rehab Potential  bilateral breast cancer, hx of radiation; implant exchange surgery 06/16/17    PT Frequency  2x / week    PT Duration  4 weeks    PT Treatment/Interventions  ADLs/Self Care Home Management;Therapeutic exercise;Patient/family education;Manual techniques;Manual lymph drainage;Scar mobilization;Passive range of motion;Taping    PT Next Visit Plan  Continue P/AA/A/ROM to both shoulders, soft tissue mobilization, and myofascial release techniques to chest, axilla, and flank areas.  Pt. comes in today with referral from [place], PA-C with Dr. Marla Roe, that reads: "okay to resume PT with myofascial  release, PROM, AROM, scar mobilization, lymph drainage.  Re-evaluate and treatment as appropriate."  (See referral in media.)    Consulted and Agree with Plan of Care  Patient       Patient will benefit from skilled therapeutic intervention in order to improve the following deficits and impairments:  Increased fascial restricitons, Pain, Postural dysfunction, Decreased scar mobility, Decreased range of motion, Decreased strength, Impaired UE functional use, Increased edema, Decreased knowledge of precautions  Visit Diagnosis: Stiffness of right shoulder, not elsewhere classified - Plan: PT plan of care cert/re-cert  Stiffness of left shoulder, not elsewhere classified - Plan: PT plan of care cert/re-cert  Abnormal posture - Plan: PT plan of care cert/re-cert  Acute pain of right shoulder - Plan: PT plan of care cert/re-cert  Disorder of the skin and subcutaneous tissue related to radiation, unspecified - Plan: PT plan of care cert/re-cert     Problem List Patient Active Problem List   Diagnosis Date Noted  . Breast cancer ([place]) 03/16/2017  . Malignant neoplasm of upper-inner quadrant of left breast in female, estrogen receptor positive ([place]) 01/03/2017  . HTN (hypertension) 06/14/2016  . Malignant neoplasm of lower-inner quadrant of right breast of female, estrogen receptor positive ([place]) 01/07/2016  . Atherosclerosis of aorta ([place])   . Smoker     SALISBURY,DONNA 07/20/2017, 9:54 AM  [place], [place], [address] Phone: [phone]  Fax:  [phone]  Name: TELIYAH ROYAL MRN: [id number] Date of Birth: [birth year]  Serafina Royals, PT 07/20/17 9:54 AM

## 2017-07-29 ENCOUNTER — Ambulatory Visit: Payer: 59 | Admitting: Physical Therapy

## 2017-07-29 DIAGNOSIS — L599 Disorder of the skin and subcutaneous tissue related to radiation, unspecified: Secondary | ICD-10-CM

## 2017-07-29 DIAGNOSIS — R293 Abnormal posture: Secondary | ICD-10-CM

## 2017-07-29 DIAGNOSIS — M25611 Stiffness of right shoulder, not elsewhere classified: Secondary | ICD-10-CM | POA: Diagnosis not present

## 2017-07-29 DIAGNOSIS — M25511 Pain in right shoulder: Secondary | ICD-10-CM

## 2017-07-29 DIAGNOSIS — M25612 Stiffness of left shoulder, not elsewhere classified: Secondary | ICD-10-CM

## 2017-07-29 NOTE — Therapy (Signed)
[place], [place], [address] Phone: [phone]   Fax:  [phone]  Physical Therapy Treatment  Patient Details  Name: Krystal Brooks MRN: [id number] Date of Birth: 03/15/1956 Referring Provider: Marla Roe   Encounter Date: 07/29/2017  PT End of Session - 07/29/17 1302    Visit Number  13    Number of Visits  20    Date for PT Re-Evaluation  08/29/17    PT Start Time  0932    PT Stop Time  1020    PT Time Calculation (min)  48 min    Activity Tolerance  Patient tolerated treatment well    Behavior During Therapy  [facility] for tasks assessed/performed       Past Medical History:  Diagnosis Date  . Adie's pupil    LEFT EYE  . Allergy   . Anxiety   . Asthma   . Atherosclerosis of aorta (HCC)    bulge above renal arteries on mri 2016  . Cancer [facility]) 2017   right breast ca  . Complication of anesthesia    VERY HARD TO WAKE UP    . Hypertension   . Personal history of radiation therapy   . PONV (postoperative nausea and vomiting)   . Smoker     Past Surgical History:  Procedure Laterality Date  . ABDOMINAL HYSTERECTOMY    . BREAST BIOPSY Right 01/02/2016  . BREAST EXCISIONAL BIOPSY Right 1979  . BREAST LUMPECTOMY Right 01/27/2016  . BREAST LUMPECTOMY WITH RADIOACTIVE SEED AND SENTINEL LYMPH NODE BIOPSY Right 01/27/2016   Procedure: BREAST LUMPECTOMY WITH RADIOACTIVE SEED AND SENTINEL LYMPH NODE BIOPSY;  Surgeon: Excell Seltzer, MD;  Location: [place];  Service: General;  Laterality: Right;  . BREAST RECONSTRUCTION WITH PLACEMENT OF TISSUE EXPANDER AND FLEX HD (ACELLULAR HYDRATED DERMIS) Bilateral 03/16/2017   Procedure: BILATERAL IMMEDIATE BREAST RECONSTRUCTION WITH PLACEMENT OF TISSUE EXPANDER AND FLEX HD (ACELLULAR HYDRATED DERMIS);  Surgeon: Wallace Going, DO;  Location: [place];  Service: Plastics;  Laterality: Bilateral;  . BREAST SURGERY  1979   cyst removed rt  breast  . CATARACT EXTRACTION W/ INTRAOCULAR LENS IMPLANT     RT EYE  . CESAREAN SECTION     x2  . MASTECTOMY W/ SENTINEL NODE BIOPSY Bilateral 03/16/2017   Procedure: BILATERAL TOTAL MASTECTOMY WITH LEFT SENTINEL LYMPH NODE BIOPSY;  Surgeon: Excell Seltzer, MD;  Location: [place];  Service: General;  Laterality: Bilateral;  . REMOVAL OF BILATERAL TISSUE EXPANDERS WITH PLACEMENT OF BILATERAL BREAST IMPLANTS Bilateral 06/15/2017   Procedure: REMOVAL OF BILATERAL TISSUE EXPANDERS WITH PLACEMENT OF BILATERAL BREAST IMPLANTS;  Surgeon: Wallace Going, DO;  Location: [place];  Service: Plastics;  Laterality: Bilateral;  . TOTAL MASTECTOMY Bilateral 03/16/2017   SENTINAL NODE BIOPSY    There were no vitals filed for this visit.  Subjective Assessment - 07/29/17 0935    Subjective  I went over right after being here to see the doctor and Shawn, the PA, took a picture of it and just said to keep tabs on it (the bruised spot).  It does look a little better. She talked to a friend who had a similar ridge as this patient's, on the right flank, and that friend suggested that massage has helped hers.  I always feel better after I leave her. Having soreness from being very active with her arms in the last week, cleaning and decorating.    Pertinent History  asthma, 2017 R breast cancer s/p lumpectomy, SLNB and radiation, 2018 L breast cancer s/p bilateral mastectomy and L SLNB bilateral reconstruction 03/16/2017.  Had exchange surgery with permanent implants 06/16/17.    Currently in Pain?  Yes    Pain Score  4     Pain Location  -- upper traps and upper flanks    Pain Orientation  Right;Left    Pain Descriptors / Indicators  Sore    Aggravating Factors   cleaning and decorating for Christmas    Pain Relieving Factors  advil         [facility] PT Assessment - 07/29/17 0001      Observation/Other Assessments   Observations  right lateral breast area that looked bruised looks  better today, not as dark, and the area that is dark is smaller                  [facility] Adult PT Treatment/Exercise - 07/29/17 0001      Manual Therapy   Soft tissue mobilization  to area of tightness at right flank that is a ridge of raised tissue (muscle tissue?)    Myofascial Release  myofascial pulling to each arm; crosshands in horizontal across upper chest; crosshands in vertical on right side of chest, as well as in diagonal from upper right flank to mid-abdomen; unidirectional pulling at lateral aspect of right flank raised ridge    Passive ROM  to right and left shoulder into er, abduction and flexion                     [facility] Goals - 07/20/17 0949      CC Long Term Goal  #1   Title  Pt will be able to independently verbalize lymphedema risk reduction practices    Time  4    Period  Weeks    Status  On-going      CC Long Term Goal  #2   Title  Pt will demonstrate 150 degrees of bilateral shoulder flexion to allow pt to reach items on top shelf and reach up to wash her hair    Baseline  Rt. 135, left 129 on re-eval on 07/20/17    Time  4    Period  Weeks    Status  On-going      CC Long Term Goal  #3   Title  Pt to demonstrate 150 degrees of bilateral shoulder abduction to allow pt to reach out to sides    Status  Achieved      CC Long Term Goal  #4   Title  Pt to be independent in a home exercise program for continued strengthening and stretching.     Status  On-going      CC Long Term Goal  #5   Title  Pt. will report at least 50% decreased discomfort in right axilla/flank area    Time  4    Period  Weeks    Status  New         Plan - 07/29/17 1303    Clinical Impression Statement  Pt. continues to feel better following therapy sessions.  She also continues to hold her shoulders in elevated and forward rounded position.  She has been more active with using both arms this week doing household chores.    Rehab Potential  Good     Clinical Impairments Affecting Rehab Potential  bilateral breast cancer, hx of radiation; implant exchange surgery  06/16/17    PT Frequency  2x / week    PT Duration  4 weeks    PT Treatment/Interventions  ADLs/Self Care Home Management;Therapeutic exercise;Patient/family education;Manual techniques;Manual lymph drainage;Scar mobilization;Passive range of motion;Taping    PT Next Visit Plan  Continue P/AA/A/ROM to both shoulders, soft tissue mobilization, and myofascial release techniques to chest, axilla, and flank areas.    PT Home Exercise Plan  supine dowel exercises, supine scap    Consulted and Agree with Plan of Care  Patient       Patient will benefit from skilled therapeutic intervention in order to improve the following deficits and impairments:  Increased fascial restricitons, Pain, Postural dysfunction, Decreased scar mobility, Decreased range of motion, Decreased strength, Impaired UE functional use, Increased edema, Decreased knowledge of precautions  Visit Diagnosis: Stiffness of right shoulder, not elsewhere classified  Stiffness of left shoulder, not elsewhere classified  Abnormal posture  Acute pain of right shoulder  Disorder of the skin and subcutaneous tissue related to radiation, unspecified     Problem List Patient Active Problem List   Diagnosis Date Noted  . Breast cancer ([place]) 03/16/2017  . Malignant neoplasm of upper-inner quadrant of left breast in female, estrogen receptor positive ([place]) 01/03/2017  . HTN (hypertension) 06/14/2016  . Malignant neoplasm of lower-inner quadrant of right breast of female, estrogen receptor positive ([place]) 01/07/2016  . Atherosclerosis of aorta ([place])   . Smoker     Sherrey North 07/29/2017, 1:05 PM  [place], [place], [address] Phone: [phone]   Fax:  [phone]  Name: Krystal Brooks MRN: [id number] Date of Birth:  31-Aug-1955  Serafina Royals, PT 07/29/17 1:05 PM

## 2017-08-02 ENCOUNTER — Ambulatory Visit: Payer: 59 | Attending: Plastic Surgery | Admitting: Physical Therapy

## 2017-08-02 ENCOUNTER — Encounter: Payer: Self-pay | Admitting: Physical Therapy

## 2017-08-02 ENCOUNTER — Other Ambulatory Visit: Payer: Self-pay

## 2017-08-02 DIAGNOSIS — L599 Disorder of the skin and subcutaneous tissue related to radiation, unspecified: Secondary | ICD-10-CM | POA: Diagnosis present

## 2017-08-02 DIAGNOSIS — M25512 Pain in left shoulder: Secondary | ICD-10-CM | POA: Diagnosis present

## 2017-08-02 DIAGNOSIS — M25612 Stiffness of left shoulder, not elsewhere classified: Secondary | ICD-10-CM | POA: Diagnosis present

## 2017-08-02 DIAGNOSIS — M6281 Muscle weakness (generalized): Secondary | ICD-10-CM | POA: Diagnosis present

## 2017-08-02 DIAGNOSIS — M25611 Stiffness of right shoulder, not elsewhere classified: Secondary | ICD-10-CM | POA: Diagnosis present

## 2017-08-02 DIAGNOSIS — R293 Abnormal posture: Secondary | ICD-10-CM

## 2017-08-02 DIAGNOSIS — M25511 Pain in right shoulder: Secondary | ICD-10-CM | POA: Insufficient documentation

## 2017-08-02 NOTE — Therapy (Signed)
[place], [place], [address] Phone: [phone]   Fax:  [phone]  Physical Therapy Treatment  Patient Details  Name: Krystal Brooks MRN: [id number] Date of Birth: 02/09/1956 Referring Provider: Marla Roe   Encounter Date: 08/02/2017  PT End of Session - 08/02/17 1852    Visit Number  14    Number of Visits  20    Date for PT Re-Evaluation  08/29/17    PT Start Time  1430    PT Stop Time  1515    PT Time Calculation (min)  45 min    Activity Tolerance  Patient tolerated treatment well    Behavior During Therapy  [facility] for tasks assessed/performed       Past Medical History:  Diagnosis Date  . Adie's pupil    LEFT EYE  . Allergy   . Anxiety   . Asthma   . Atherosclerosis of aorta (HCC)    bulge above renal arteries on mri 2016  . Cancer [facility]) 2017   right breast ca  . Complication of anesthesia    VERY HARD TO WAKE UP    . Hypertension   . Personal history of radiation therapy   . PONV (postoperative nausea and vomiting)   . Smoker     Past Surgical History:  Procedure Laterality Date  . ABDOMINAL HYSTERECTOMY    . BREAST BIOPSY Right 01/02/2016  . BREAST EXCISIONAL BIOPSY Right 1979  . BREAST LUMPECTOMY Right 01/27/2016  . BREAST LUMPECTOMY WITH RADIOACTIVE SEED AND SENTINEL LYMPH NODE BIOPSY Right 01/27/2016   Procedure: BREAST LUMPECTOMY WITH RADIOACTIVE SEED AND SENTINEL LYMPH NODE BIOPSY;  Surgeon: Excell Seltzer, MD;  Location: [place];  Service: General;  Laterality: Right;  . BREAST RECONSTRUCTION WITH PLACEMENT OF TISSUE EXPANDER AND FLEX HD (ACELLULAR HYDRATED DERMIS) Bilateral 03/16/2017   Procedure: BILATERAL IMMEDIATE BREAST RECONSTRUCTION WITH PLACEMENT OF TISSUE EXPANDER AND FLEX HD (ACELLULAR HYDRATED DERMIS);  Surgeon: Wallace Going, DO;  Location: [place];  Service: Plastics;  Laterality: Bilateral;  . BREAST SURGERY  1979   cyst removed rt  breast  . CATARACT EXTRACTION W/ INTRAOCULAR LENS IMPLANT     RT EYE  . CESAREAN SECTION     x2  . MASTECTOMY W/ SENTINEL NODE BIOPSY Bilateral 03/16/2017   Procedure: BILATERAL TOTAL MASTECTOMY WITH LEFT SENTINEL LYMPH NODE BIOPSY;  Surgeon: Excell Seltzer, MD;  Location: [place];  Service: General;  Laterality: Bilateral;  . REMOVAL OF BILATERAL TISSUE EXPANDERS WITH PLACEMENT OF BILATERAL BREAST IMPLANTS Bilateral 06/15/2017   Procedure: REMOVAL OF BILATERAL TISSUE EXPANDERS WITH PLACEMENT OF BILATERAL BREAST IMPLANTS;  Surgeon: Wallace Going, DO;  Location: [place];  Service: Plastics;  Laterality: Bilateral;  . TOTAL MASTECTOMY Bilateral 03/16/2017   SENTINAL NODE BIOPSY    There were no vitals filed for this visit.  Subjective Assessment - 08/02/17 1440    Subjective  Pt says she has been sore since last treatment, but feels better today. She feels fullness in right lateral chest and back     Pertinent History  asthma, 2017 R breast cancer s/p lumpectomy, SLNB and radiation, 2018 L breast cancer s/p bilateral mastectomy and L SLNB bilateral reconstruction 03/16/2017.  Had exchange surgery with permanent implants 06/16/17.    Patient Stated Goals  try to get the chest muscle tightness loosened up; not having the ROM problems    Currently in Pain?  Yes    Pain Score  4  it never completely goes away     Pain Orientation  Right    Pain Descriptors / Indicators  Sore;Tightness    Pain Type  Surgical pain                      [facility] Adult PT Treatment/Exercise - 08/02/17 0001      Shoulder Exercises: Supine   Protraction  AROM;Right;5 reps      Shoulder Exercises: Sidelying   ABduction  AROM;Right;5 reps    Other Sidelying Exercises  scapular protraction and scapular mobility       Manual Therapy   Manual Therapy  Manual Lymphatic Drainage (MLD)    Myofascial Release  briefly to lateral chest and axilla     Manual Lymphatic Drainage (MLD)   in supine, short neck, superficial and deep abdominals,  right inguinal nodes and right axillo-inguinal anastamosis, then to left sidelying for MLD to lateral chest and periscpular area as well as lateral chest and "ridge"                      [facility] Goals - 07/20/17 0949      CC Long Term Goal  #1   Title  Pt will be able to independently verbalize lymphedema risk reduction practices    Time  4    Period  Weeks    Status  On-going      CC Long Term Goal  #2   Title  Pt will demonstrate 150 degrees of bilateral shoulder flexion to allow pt to reach items on top shelf and reach up to wash her hair    Baseline  Rt. 135, left 129 on re-eval on 07/20/17    Time  4    Period  Weeks    Status  On-going      CC Long Term Goal  #3   Title  Pt to demonstrate 150 degrees of bilateral shoulder abduction to allow pt to reach out to sides    Status  Achieved      CC Long Term Goal  #4   Title  Pt to be independent in a home exercise program for continued strengthening and stretching.     Status  On-going      CC Long Term Goal  #5   Title  Pt. will report at least 50% decreased discomfort in right axilla/flank area    Time  4    Period  Weeks    Status  New         Plan - 08/02/17 1853    Clinical Impression Statement  Pt reported relief after PT session stating she felt the "ridge" felt softer. Encouraged pt to continue range of motion of sholders.     Rehab Potential  Good    Clinical Impairments Affecting Rehab Potential  bilateral breast cancer, hx of radiation; implant exchange surgery 06/16/17    PT Frequency  2x / week    PT Duration  4 weeks    PT Treatment/Interventions  ADLs/Self Care Home Management;Therapeutic exercise;Patient/family education;Manual techniques;Manual lymph drainage;Scar mobilization;Passive range of motion;Taping    PT Next Visit Plan  Continue P/AA/A/ROM to both shoulders, MLD, soft tissue mobilization, and myofascial release  techniques to chest, axilla, and flank areas. Consider foam patch to wear inside fitted camisole that pt usually wears     Consulted and Agree with Plan of Care  Patient       Patient  will benefit from skilled therapeutic intervention in order to improve the following deficits and impairments:  Increased fascial restricitons, Pain, Postural dysfunction, Decreased scar mobility, Decreased range of motion, Decreased strength, Impaired UE functional use, Increased edema, Decreased knowledge of precautions  Visit Diagnosis: Stiffness of right shoulder, not elsewhere classified  Stiffness of left shoulder, not elsewhere classified  Abnormal posture  Acute pain of right shoulder  Disorder of the skin and subcutaneous tissue related to radiation, unspecified  Acute pain of left shoulder  Muscle weakness (generalized)     Problem List Patient Active Problem List   Diagnosis Date Noted  . Breast cancer ([place]) 03/16/2017  . Malignant neoplasm of upper-inner quadrant of left breast in female, estrogen receptor positive ([place]) 01/03/2017  . HTN (hypertension) 06/14/2016  . Malignant neoplasm of lower-inner quadrant of right breast of female, estrogen receptor positive ([place]) 01/07/2016  . Atherosclerosis of aorta ([place])   . Smoker    Donato Heinz. Owens Shark PT  Norwood Levo 08/02/2017, 6:56 PM  [place], [place], [address] Phone: [phone]   Fax:  [phone]  Name: DEVRA STARE MRN: [id number] Date of Birth: 30-Apr-1956

## 2017-08-04 ENCOUNTER — Encounter: Payer: 59 | Admitting: Physical Therapy

## 2017-08-08 ENCOUNTER — Ambulatory Visit: Payer: 59 | Admitting: Physical Therapy

## 2017-08-10 ENCOUNTER — Ambulatory Visit: Payer: 59 | Admitting: Physical Therapy

## 2017-08-10 DIAGNOSIS — M25611 Stiffness of right shoulder, not elsewhere classified: Secondary | ICD-10-CM | POA: Diagnosis not present

## 2017-08-10 DIAGNOSIS — M25612 Stiffness of left shoulder, not elsewhere classified: Secondary | ICD-10-CM

## 2017-08-10 DIAGNOSIS — M25511 Pain in right shoulder: Secondary | ICD-10-CM

## 2017-08-10 DIAGNOSIS — R293 Abnormal posture: Secondary | ICD-10-CM

## 2017-08-10 DIAGNOSIS — L599 Disorder of the skin and subcutaneous tissue related to radiation, unspecified: Secondary | ICD-10-CM

## 2017-08-10 NOTE — Therapy (Signed)
[place], [place], [address] Phone: [phone]   Fax:  [phone]  Physical Therapy Treatment  Patient Details  Name: Krystal Brooks MRN: [id number] Date of Birth: 03/21/1956 Referring Provider: Marla Roe   Encounter Date: 08/10/2017  PT End of Session - 08/10/17 1740    Visit Number  15    Number of Visits  20    Date for PT Re-Evaluation  08/29/17    PT Start Time  8921    PT Stop Time  1431    PT Time Calculation (min)  43 min    Activity Tolerance  Patient tolerated treatment well    Behavior During Therapy  [facility] for tasks assessed/performed       Past Medical History:  Diagnosis Date  . Adie's pupil    LEFT EYE  . Allergy   . Anxiety   . Asthma   . Atherosclerosis of aorta (HCC)    bulge above renal arteries on mri 2016  . Cancer [facility]) 2017   right breast ca  . Complication of anesthesia    VERY HARD TO WAKE UP    . Hypertension   . Personal history of radiation therapy   . PONV (postoperative nausea and vomiting)   . Smoker     Past Surgical History:  Procedure Laterality Date  . ABDOMINAL HYSTERECTOMY    . BREAST BIOPSY Right 01/02/2016  . BREAST EXCISIONAL BIOPSY Right 1979  . BREAST LUMPECTOMY Right 01/27/2016  . BREAST LUMPECTOMY WITH RADIOACTIVE SEED AND SENTINEL LYMPH NODE BIOPSY Right 01/27/2016   Procedure: BREAST LUMPECTOMY WITH RADIOACTIVE SEED AND SENTINEL LYMPH NODE BIOPSY;  Surgeon: Excell Seltzer, MD;  Location: [place];  Service: General;  Laterality: Right;  . BREAST RECONSTRUCTION WITH PLACEMENT OF TISSUE EXPANDER AND FLEX HD (ACELLULAR HYDRATED DERMIS) Bilateral 03/16/2017   Procedure: BILATERAL IMMEDIATE BREAST RECONSTRUCTION WITH PLACEMENT OF TISSUE EXPANDER AND FLEX HD (ACELLULAR HYDRATED DERMIS);  Surgeon: Wallace Going, DO;  Location: [place];  Service: Plastics;  Laterality: Bilateral;  . BREAST SURGERY  1979   cyst removed rt  breast  . CATARACT EXTRACTION W/ INTRAOCULAR LENS IMPLANT     RT EYE  . CESAREAN SECTION     x2  . MASTECTOMY W/ SENTINEL NODE BIOPSY Bilateral 03/16/2017   Procedure: BILATERAL TOTAL MASTECTOMY WITH LEFT SENTINEL LYMPH NODE BIOPSY;  Surgeon: Excell Seltzer, MD;  Location: [place];  Service: General;  Laterality: Bilateral;  . REMOVAL OF BILATERAL TISSUE EXPANDERS WITH PLACEMENT OF BILATERAL BREAST IMPLANTS Bilateral 06/15/2017   Procedure: REMOVAL OF BILATERAL TISSUE EXPANDERS WITH PLACEMENT OF BILATERAL BREAST IMPLANTS;  Surgeon: Wallace Going, DO;  Location: [place];  Service: Plastics;  Laterality: Bilateral;  . TOTAL MASTECTOMY Bilateral 03/16/2017   SENTINAL NODE BIOPSY    There were no vitals filed for this visit.  Subjective Assessment - 08/10/17 1348    Subjective  "I'm gonna have surgery again.  On the right side they're going to take this latissimus around and put it over the implant.  They're going to do some lipo on the left."    Pertinent History  asthma, 2017 R breast cancer s/p lumpectomy, SLNB and radiation, 2018 L breast cancer s/p bilateral mastectomy and L SLNB bilateral reconstruction 03/16/2017.  Had exchange surgery with permanent implants 06/16/17.    Currently in Pain?  No/denies    Pain Relieving Factors  advil         [facility]  PT Assessment - 08/10/17 0001      AROM   Right Shoulder Flexion  140 Degrees    Right Shoulder ABduction  166 Degrees    Left Shoulder Flexion  134 Degrees    Left Shoulder ABduction  180 Degrees                  [facility] Adult PT Treatment/Exercise - 08/10/17 0001      Shoulder Exercises: Supine   Other Supine Exercises  Lie with arms in about 80 degrees abduction/nrutral horizontal abduction, then hooklying lower trunk rotation with arms outstretched x 30 seconds      Shoulder Exercises: Prone   Other Prone Exercises  child's or prayer pose x 30 seconds, then with hands to left x 30 sec. and  hands to right x 30 sec.      Manual Therapy   Soft tissue mobilization  to tight area just inferior to right breast    Myofascial Release  crosshands in horizontal at upper chest; in vertical at right chest; crosshands at right pect across right anterior shoulder area, including with arm in horizontal abduction    Passive ROM  supine bilat. pect. stretches; right shoulder into er, abduction, and flexion; also into horizontal abduction             PT Education - 08/10/17 1739    Education provided  Yes    Education Details  lie supine with arms stretching into horizontal abduction; child's or prayer pose with arms straight up, to right and to left    Person(s) Educated  Patient    Methods  Explanation;Verbal cues    Comprehension  Verbalized understanding;Returned demonstration             [facility] Goals - 08/10/17 1351      CC Long Term Goal  #2   Title  Pt will demonstrate 150 degrees of bilateral shoulder flexion to allow pt to reach items on top shelf and reach up to wash her hair    Status  On-going      CC Long Term Goal  #3   Title  Pt to demonstrate 150 degrees of bilateral shoulder abduction to allow pt to reach out to sides    Status  Achieved      CC Long Term Goal  #4   Title  Pt to be independent in a home exercise program for continued strengthening and stretching.     Status  On-going      CC Long Term Goal  #5   Title  Pt. will report at least 50% decreased discomfort in right axilla/flank area    Status  On-going         Plan - 08/10/17 1740    Clinical Impression Statement  Pt.'s big news is that she saw the plastic surgeon and will have a lat flap revision of her right breast reconstruction in late February.  This is because the right soft tissue is so tight from prior radiation that she is unable to have a good outcome without the additional surgery. Surgeon wants her to continue to stretch in the meantime. Added a couple more home  stretches today.    Rehab Potential  Good    Clinical Impairments Affecting Rehab Potential  bilateral breast cancer, hx of radiation; implant exchange surgery 06/16/17    PT Frequency  2x / week    PT Duration  4 weeks    PT Treatment/Interventions  ADLs/Self  Care Home Management;Therapeutic exercise;Patient/family education;Manual techniques;Manual lymph drainage;Scar mobilization;Passive range of motion;Taping    PT Next Visit Plan  Continue P/AA/A/ROM to both shoulders, MLD, soft tissue mobilization, and myofascial release techniques to chest, axilla, and flank areas. Consider foam patch to wear inside fitted camisole that pt usually wears     PT Home Exercise Plan  supine dowel exercises, supine scap    Consulted and Agree with Plan of Care  Patient       Patient will benefit from skilled therapeutic intervention in order to improve the following deficits and impairments:  Increased fascial restricitons, Pain, Postural dysfunction, Decreased scar mobility, Decreased range of motion, Decreased strength, Impaired UE functional use, Increased edema, Decreased knowledge of precautions  Visit Diagnosis: Stiffness of right shoulder, not elsewhere classified  Stiffness of left shoulder, not elsewhere classified  Abnormal posture  Acute pain of right shoulder  Disorder of the skin and subcutaneous tissue related to radiation, unspecified     Problem List Patient Active Problem List   Diagnosis Date Noted  . Breast cancer ([place]) 03/16/2017  . Malignant neoplasm of upper-inner quadrant of left breast in female, estrogen receptor positive ([place]) 01/03/2017  . HTN (hypertension) 06/14/2016  . Malignant neoplasm of lower-inner quadrant of right breast of female, estrogen receptor positive ([place]) 01/07/2016  . Atherosclerosis of aorta ([place])   . Smoker     Krystal Brooks 08/10/2017, 5:43 PM  [place], [place], [address] Phone: [phone]   Fax:  [phone]  Name: Krystal Brooks MRN: [id number] Date of Birth: 04-Feb-1956  Serafina Royals, PT 08/10/17 5:43 PM

## 2017-08-12 ENCOUNTER — Encounter: Payer: Self-pay | Admitting: Physical Therapy

## 2017-08-12 ENCOUNTER — Ambulatory Visit: Payer: 59 | Admitting: Physical Therapy

## 2017-08-12 DIAGNOSIS — R293 Abnormal posture: Secondary | ICD-10-CM

## 2017-08-12 DIAGNOSIS — M25612 Stiffness of left shoulder, not elsewhere classified: Secondary | ICD-10-CM

## 2017-08-12 DIAGNOSIS — M25511 Pain in right shoulder: Secondary | ICD-10-CM

## 2017-08-12 DIAGNOSIS — M25611 Stiffness of right shoulder, not elsewhere classified: Secondary | ICD-10-CM

## 2017-08-12 DIAGNOSIS — L599 Disorder of the skin and subcutaneous tissue related to radiation, unspecified: Secondary | ICD-10-CM

## 2017-08-12 NOTE — Therapy (Signed)
[place], [place], [address] Phone: [phone]   Fax:  [phone]  Physical Therapy Treatment  Patient Details  Name: Krystal Brooks MRN: [id number] Date of Birth: Apr 01, 1956 Referring Provider: Marla Roe   Encounter Date: 08/12/2017  PT End of Session - 08/12/17 0939    Visit Number  16    Number of Visits  20    Date for PT Re-Evaluation  08/29/17    PT Start Time  0850    PT Stop Time  0930    PT Time Calculation (min)  40 min    Activity Tolerance  Patient tolerated treatment well    Behavior During Therapy  [facility] for tasks assessed/performed       Past Medical History:  Diagnosis Date  . Adie's pupil    LEFT EYE  . Allergy   . Anxiety   . Asthma   . Atherosclerosis of aorta (HCC)    bulge above renal arteries on mri 2016  . Cancer [facility]) 2017   right breast ca  . Complication of anesthesia    VERY HARD TO WAKE UP    . Hypertension   . Personal history of radiation therapy   . PONV (postoperative nausea and vomiting)   . Smoker     Past Surgical History:  Procedure Laterality Date  . ABDOMINAL HYSTERECTOMY    . BREAST BIOPSY Right 01/02/2016  . BREAST EXCISIONAL BIOPSY Right 1979  . BREAST LUMPECTOMY Right 01/27/2016  . BREAST LUMPECTOMY WITH RADIOACTIVE SEED AND SENTINEL LYMPH NODE BIOPSY Right 01/27/2016   Procedure: BREAST LUMPECTOMY WITH RADIOACTIVE SEED AND SENTINEL LYMPH NODE BIOPSY;  Surgeon: Excell Seltzer, MD;  Location: [place];  Service: General;  Laterality: Right;  . BREAST RECONSTRUCTION WITH PLACEMENT OF TISSUE EXPANDER AND FLEX HD (ACELLULAR HYDRATED DERMIS) Bilateral 03/16/2017   Procedure: BILATERAL IMMEDIATE BREAST RECONSTRUCTION WITH PLACEMENT OF TISSUE EXPANDER AND FLEX HD (ACELLULAR HYDRATED DERMIS);  Surgeon: Wallace Going, DO;  Location: [place];  Service: Plastics;  Laterality: Bilateral;  . BREAST SURGERY  1979   cyst removed rt  breast  . CATARACT EXTRACTION W/ INTRAOCULAR LENS IMPLANT     RT EYE  . CESAREAN SECTION     x2  . MASTECTOMY W/ SENTINEL NODE BIOPSY Bilateral 03/16/2017   Procedure: BILATERAL TOTAL MASTECTOMY WITH LEFT SENTINEL LYMPH NODE BIOPSY;  Surgeon: Excell Seltzer, MD;  Location: [place];  Service: General;  Laterality: Bilateral;  . REMOVAL OF BILATERAL TISSUE EXPANDERS WITH PLACEMENT OF BILATERAL BREAST IMPLANTS Bilateral 06/15/2017   Procedure: REMOVAL OF BILATERAL TISSUE EXPANDERS WITH PLACEMENT OF BILATERAL BREAST IMPLANTS;  Surgeon: Wallace Going, DO;  Location: [place];  Service: Plastics;  Laterality: Bilateral;  . TOTAL MASTECTOMY Bilateral 03/16/2017   SENTINAL NODE BIOPSY    There were no vitals filed for this visit.  Subjective Assessment - 08/12/17 0934    Subjective  pt reports she has been doing stretches at home and wants to keep doing the stretches and massage while she is here to get ready for the surgery in February    Pertinent History  asthma, 2017 R breast cancer s/p lumpectomy, SLNB and radiation, 2018 L breast cancer s/p bilateral mastectomy and L SLNB bilateral reconstruction 03/16/2017.  Had exchange surgery with permanent implants 06/16/17.    Patient Stated Goals  try to get the chest muscle tightness loosened up; not having the ROM problems    Currently in Pain?  No/denies                      [facility] Adult PT Treatment/Exercise - 08/12/17 0001      Shoulder Exercises: Supine   Other Supine Exercises  Lie with arms in about 80 degrees abduction/nrutral horizontal abduction, then hooklying lower trunk rotation with arms outstretched x 30 seconds      Shoulder Exercises: ROM/Strengthening   Wall Wash  windshield wash with both arms.     Ball on Wall  forward with both arms with gentle lean in at the top, sidewayx 5 with forearm on ball,       Manual Therapy   Manual therapy comments  passive over pressure to scapular retraction  in supine for anterior chest stretch     Soft tissue mobilization  in left sidelying, with biotone, soft tissue work and prolonged pressure to trigger point areas in upper traps and to posterior shoulder and latts area                      [facility] Goals - 08/10/17 1351      CC Long Term Goal  #2   Title  Pt will demonstrate 150 degrees of bilateral shoulder flexion to allow pt to reach items on top shelf and reach up to wash her hair    Status  On-going      CC Long Term Goal  #3   Title  Pt to demonstrate 150 degrees of bilateral shoulder abduction to allow pt to reach out to sides    Status  Achieved      CC Long Term Goal  #4   Title  Pt to be independent in a home exercise program for continued strengthening and stretching.     Status  On-going      CC Long Term Goal  #5   Title  Pt. will report at least 50% decreased discomfort in right axilla/flank area    Status  On-going         Plan - 08/12/17 0939    Clinical Impression Statement  Added wall stretches with ball and towel for pt to do within her pain limit so that she can do some of those at home.  Pt got significant relieft from soft tissue work to tigger points and tight muscles at right traps and posterior shoudler     Clinical Impairments Affecting Rehab Potential  bilateral breast cancer, hx of radiation; implant exchange surgery 06/16/17    PT Frequency  2x / week    PT Duration  4 weeks    PT Next Visit Plan  Focus on soft tissue work for trigger points at right traps and posterior shoulder and latts Continue P/AA/A/ROM to both shoulders, MLD, soft tissue mobilization, and myofascial release techniques to chest, axilla, and flank areas. Consider foam patch to wear inside fitted camisole that pt usually wears     PT Home Exercise Plan  Avoid pulleys as it may be too aggressive on incsions.        Patient will benefit from skilled therapeutic intervention in order to improve the following  deficits and impairments:  Increased fascial restricitons, Pain, Postural dysfunction, Decreased scar mobility, Decreased range of motion, Decreased strength, Impaired UE functional use, Increased edema, Decreased knowledge of precautions  Visit Diagnosis: Stiffness of right shoulder, not elsewhere classified  Stiffness of left shoulder, not elsewhere classified  Abnormal posture  Acute pain of right  shoulder  Disorder of the skin and subcutaneous tissue related to radiation, unspecified     Problem List Patient Active Problem List   Diagnosis Date Noted  . Breast cancer ([place]) 03/16/2017  . Malignant neoplasm of upper-inner quadrant of left breast in female, estrogen receptor positive ([place]) 01/03/2017  . HTN (hypertension) 06/14/2016  . Malignant neoplasm of lower-inner quadrant of right breast of female, estrogen receptor positive ([place]) 01/07/2016  . Atherosclerosis of aorta ([place])   . Smoker    Donato Heinz. Owens Shark PT  Norwood Levo 08/12/2017, 9:44 AM  [place], [place], [address] Phone: [phone]   Fax:  [phone]  Name: Krystal Brooks MRN: [id number] Date of Birth: 06/09/1956

## 2017-08-15 ENCOUNTER — Ambulatory Visit: Payer: 59 | Admitting: Physical Therapy

## 2017-08-15 DIAGNOSIS — M25611 Stiffness of right shoulder, not elsewhere classified: Secondary | ICD-10-CM

## 2017-08-15 DIAGNOSIS — L599 Disorder of the skin and subcutaneous tissue related to radiation, unspecified: Secondary | ICD-10-CM

## 2017-08-15 DIAGNOSIS — M25612 Stiffness of left shoulder, not elsewhere classified: Secondary | ICD-10-CM

## 2017-08-15 DIAGNOSIS — M25511 Pain in right shoulder: Secondary | ICD-10-CM

## 2017-08-15 DIAGNOSIS — R293 Abnormal posture: Secondary | ICD-10-CM

## 2017-08-15 NOTE — Therapy (Signed)
[place], [place], [address] Phone: [phone]   Fax:  [phone]  Physical Therapy Treatment  Patient Details  Name: Krystal Brooks MRN: [id number] Date of Birth: 07-04-1956 Referring Provider: Marla Roe   Encounter Date: 08/15/2017  PT End of Session - 08/15/17 1726    Visit Number  17    Number of Visits  20    Date for PT Re-Evaluation  08/29/17    PT Start Time  1347    PT Stop Time  1433    PT Time Calculation (min)  46 min    Activity Tolerance  Patient tolerated treatment well    Behavior During Therapy  [facility] for tasks assessed/performed       Past Medical History:  Diagnosis Date  . Adie's pupil    LEFT EYE  . Allergy   . Anxiety   . Asthma   . Atherosclerosis of aorta (HCC)    bulge above renal arteries on mri 2016  . Cancer [facility]) 2017   right breast ca  . Complication of anesthesia    VERY HARD TO WAKE UP    . Hypertension   . Personal history of radiation therapy   . PONV (postoperative nausea and vomiting)   . Smoker     Past Surgical History:  Procedure Laterality Date  . ABDOMINAL HYSTERECTOMY    . BREAST BIOPSY Right 01/02/2016  . BREAST EXCISIONAL BIOPSY Right 1979  . BREAST LUMPECTOMY Right 01/27/2016  . BREAST LUMPECTOMY WITH RADIOACTIVE SEED AND SENTINEL LYMPH NODE BIOPSY Right 01/27/2016   Procedure: BREAST LUMPECTOMY WITH RADIOACTIVE SEED AND SENTINEL LYMPH NODE BIOPSY;  Surgeon: Excell Seltzer, MD;  Location: [place];  Service: General;  Laterality: Right;  . BREAST RECONSTRUCTION WITH PLACEMENT OF TISSUE EXPANDER AND FLEX HD (ACELLULAR HYDRATED DERMIS) Bilateral 03/16/2017   Procedure: BILATERAL IMMEDIATE BREAST RECONSTRUCTION WITH PLACEMENT OF TISSUE EXPANDER AND FLEX HD (ACELLULAR HYDRATED DERMIS);  Surgeon: Wallace Going, DO;  Location: [place];  Service: Plastics;  Laterality: Bilateral;  . BREAST SURGERY  1979   cyst removed rt  breast  . CATARACT EXTRACTION W/ INTRAOCULAR LENS IMPLANT     RT EYE  . CESAREAN SECTION     x2  . MASTECTOMY W/ SENTINEL NODE BIOPSY Bilateral 03/16/2017   Procedure: BILATERAL TOTAL MASTECTOMY WITH LEFT SENTINEL LYMPH NODE BIOPSY;  Surgeon: Excell Seltzer, MD;  Location: [place];  Service: General;  Laterality: Bilateral;  . REMOVAL OF BILATERAL TISSUE EXPANDERS WITH PLACEMENT OF BILATERAL BREAST IMPLANTS Bilateral 06/15/2017   Procedure: REMOVAL OF BILATERAL TISSUE EXPANDERS WITH PLACEMENT OF BILATERAL BREAST IMPLANTS;  Surgeon: Wallace Going, DO;  Location: [place];  Service: Plastics;  Laterality: Bilateral;  . TOTAL MASTECTOMY Bilateral 03/16/2017   SENTINAL NODE BIOPSY    There were no vitals filed for this visit.  Subjective Assessment - 08/15/17 1351    Subjective  Nothing new.  I was a little sore after you last time, but not after Friday.    Pertinent History  asthma, 2017 R breast cancer s/p lumpectomy, SLNB and radiation, 2018 L breast cancer s/p bilateral mastectomy and L SLNB bilateral reconstruction 03/16/2017.  Had exchange surgery with permanent implants 06/16/17.    Currently in Pain?  No/denies         [facility] PT Assessment - 08/15/17 0001      Assessment   Medical Diagnosis  [facility] Adult PT Treatment/Exercise - 08/15/17 0001      Shoulder Exercises: Supine   Horizontal ABduction  AAROM;Both hold for 30 seconds to stretch; then "V" position x 30 sec.    Other Supine Exercises  over towel roll, chin retraction and shoulder extension with scapular retraction, 5 count isometric holds x 10;    Other Supine Exercises  at edge of mat, right arm hanging over edge into horizontal abduction with 60 second hold for stretch      Shoulder Exercises: Sidelying   Other Sidelying Exercises  in left sidelying, right arm up and out as far as able, trying to take scapula into retraction and shoulder into horizontal abduction/  D2      Manual Therapy   Myofascial Release  crosshands in horizontal across upper chest in supine over towel roll; same in supine not over towel roll; unidirectional pull in inferior direction at lower right breast; also in lateral direction at pect insertion area; bidirectional release at right upper arm and right medial abdomen in diagonal; bidrectional release at right pect tendon area.  In left sidelying, stretch two ways at right upper arm and right upper outer breast; myofascial pulling right arm in left sidelyin.    Scapular Mobilization  to right in left sidelying    Passive ROM  supine over towel roll bilat. pect minor stretches x 2 for 30-60 seconds; right shoulder into er, abduction, and flexion                      [facility] Goals - 08/10/17 1351      CC Long Term Goal  #2   Title  Pt will demonstrate 150 degrees of bilateral shoulder flexion to allow pt to reach items on top shelf and reach up to wash her hair    Status  On-going      CC Long Term Goal  #3   Title  Pt to demonstrate 150 degrees of bilateral shoulder abduction to allow pt to reach out to sides    Status  Achieved      CC Long Term Goal  #4   Title  Pt to be independent in a home exercise program for continued strengthening and stretching.     Status  On-going      CC Long Term Goal  #5   Title  Pt. will report at least 50% decreased discomfort in right axilla/flank area    Status  On-going         Plan - 08/15/17 1727    Clinical Impression Statement  Still really tight and in need of further stretching. Benefits from manual work.    Rehab Potential  Good    Clinical Impairments Affecting Rehab Potential  bilateral breast cancer, hx of radiation; implant exchange surgery 06/16/17    PT Frequency  2x / week    PT Duration  4 weeks    PT Treatment/Interventions  ADLs/Self Care Home Management;Therapeutic exercise;Patient/family education;Manual techniques;Manual lymph drainage;Scar  mobilization;Passive range of motion;Taping    PT Next Visit Plan  Focus on soft tissue work for trigger points at right traps and posterior shoulder and latts Continue P/AA/A/ROM to both shoulders, MLD, soft tissue mobilization, and myofascial release techniques to chest, axilla, and flank areas. Consider foam patch to wear inside fitted camisole that pt usually wears     PT Home Exercise Plan  Avoid pulleys as it may be too aggressive on incsions.  Consulted and Agree with Plan of Care  Patient       Patient will benefit from skilled therapeutic intervention in order to improve the following deficits and impairments:  Increased fascial restricitons, Pain, Postural dysfunction, Decreased scar mobility, Decreased range of motion, Decreased strength, Impaired UE functional use, Increased edema, Decreased knowledge of precautions  Visit Diagnosis: Stiffness of right shoulder, not elsewhere classified  Stiffness of left shoulder, not elsewhere classified  Abnormal posture  Acute pain of right shoulder  Disorder of the skin and subcutaneous tissue related to radiation, unspecified     Problem List Patient Active Problem List   Diagnosis Date Noted  . Breast cancer ([place]) 03/16/2017  . Malignant neoplasm of upper-inner quadrant of left breast in female, estrogen receptor positive ([place]) 01/03/2017  . HTN (hypertension) 06/14/2016  . Malignant neoplasm of lower-inner quadrant of right breast of female, estrogen receptor positive ([place]) 01/07/2016  . Atherosclerosis of aorta ([place])   . Smoker     Avila Albritton 08/15/2017, 5:28 PM  [place], [place], [address] Phone: [phone]   Fax:  [phone]  Name: Krystal Brooks MRN: [id number] Date of Birth: [birth year]  Serafina Royals, PT 08/15/17 5:28 PM

## 2017-08-17 ENCOUNTER — Ambulatory Visit: Payer: 59 | Admitting: Physical Therapy

## 2017-08-17 DIAGNOSIS — M25612 Stiffness of left shoulder, not elsewhere classified: Secondary | ICD-10-CM

## 2017-08-17 DIAGNOSIS — R293 Abnormal posture: Secondary | ICD-10-CM

## 2017-08-17 DIAGNOSIS — M25611 Stiffness of right shoulder, not elsewhere classified: Secondary | ICD-10-CM

## 2017-08-17 DIAGNOSIS — M25511 Pain in right shoulder: Secondary | ICD-10-CM

## 2017-08-17 DIAGNOSIS — L599 Disorder of the skin and subcutaneous tissue related to radiation, unspecified: Secondary | ICD-10-CM

## 2017-08-17 NOTE — Therapy (Signed)
[place], [place], [address] Phone: [phone]   Fax:  [phone]  Physical Therapy Treatment  Patient Details  Name: Krystal Brooks MRN: [id number] Date of Birth: Aug 07, 1956 Referring Provider: Marla Roe   Encounter Date: 08/17/2017  PT End of Session - 08/17/17 1715    Visit Number  18    Number of Visits  20    Date for PT Re-Evaluation  08/29/17    PT Start Time  7169    PT Stop Time  1430    PT Time Calculation (min)  41 min    Activity Tolerance  Patient tolerated treatment well    Behavior During Therapy  [facility] for tasks assessed/performed       Past Medical History:  Diagnosis Date  . Adie's pupil    LEFT EYE  . Allergy   . Anxiety   . Asthma   . Atherosclerosis of aorta (HCC)    bulge above renal arteries on mri 2016  . Cancer [facility]) 2017   right breast ca  . Complication of anesthesia    VERY HARD TO WAKE UP    . Hypertension   . Personal history of radiation therapy   . PONV (postoperative nausea and vomiting)   . Smoker     Past Surgical History:  Procedure Laterality Date  . ABDOMINAL HYSTERECTOMY    . BREAST BIOPSY Right 01/02/2016  . BREAST EXCISIONAL BIOPSY Right 1979  . BREAST LUMPECTOMY Right 01/27/2016  . BREAST LUMPECTOMY WITH RADIOACTIVE SEED AND SENTINEL LYMPH NODE BIOPSY Right 01/27/2016   Procedure: BREAST LUMPECTOMY WITH RADIOACTIVE SEED AND SENTINEL LYMPH NODE BIOPSY;  Surgeon: Excell Seltzer, MD;  Location: [place];  Service: General;  Laterality: Right;  . BREAST RECONSTRUCTION WITH PLACEMENT OF TISSUE EXPANDER AND FLEX HD (ACELLULAR HYDRATED DERMIS) Bilateral 03/16/2017   Procedure: BILATERAL IMMEDIATE BREAST RECONSTRUCTION WITH PLACEMENT OF TISSUE EXPANDER AND FLEX HD (ACELLULAR HYDRATED DERMIS);  Surgeon: Wallace Going, DO;  Location: [place];  Service: Plastics;  Laterality: Bilateral;  . BREAST SURGERY  1979   cyst removed rt  breast  . CATARACT EXTRACTION W/ INTRAOCULAR LENS IMPLANT     RT EYE  . CESAREAN SECTION     x2  . MASTECTOMY W/ SENTINEL NODE BIOPSY Bilateral 03/16/2017   Procedure: BILATERAL TOTAL MASTECTOMY WITH LEFT SENTINEL LYMPH NODE BIOPSY;  Surgeon: Excell Seltzer, MD;  Location: [place];  Service: General;  Laterality: Bilateral;  . REMOVAL OF BILATERAL TISSUE EXPANDERS WITH PLACEMENT OF BILATERAL BREAST IMPLANTS Bilateral 06/15/2017   Procedure: REMOVAL OF BILATERAL TISSUE EXPANDERS WITH PLACEMENT OF BILATERAL BREAST IMPLANTS;  Surgeon: Wallace Going, DO;  Location: [place];  Service: Plastics;  Laterality: Bilateral;  . TOTAL MASTECTOMY Bilateral 03/16/2017   SENTINAL NODE BIOPSY    There were no vitals filed for this visit.  Subjective Assessment - 08/17/17 1351    Subjective  "I'm really not having much pain anymore."    Pertinent History  asthma, 2017 R breast cancer s/p lumpectomy, SLNB and radiation, 2018 L breast cancer s/p bilateral mastectomy and L SLNB bilateral reconstruction 03/16/2017.  Had exchange surgery with permanent implants 06/16/17.    Currently in Pain?  No/denies                      [facility] Adult PT Treatment/Exercise - 08/17/17 0001      Manual Therapy   Myofascial Release  crosshands in horizontal  across upper chest in supine not over towel roll; unidirectional pull in inferior direction at lower right breast; also in lateral direction at pect insertion area; bidirectional release at right upper arm and right medial abdomen in diagonal; bidrectional release at right pect tendon area.     Passive ROM  supine bilat. pect minor stretches x 2 for 60 seconds; right shoulder into er, abduction, and flexion. Supine over towel roll, vigorous pect minor stretches with arms at sides and arms in T, prolonged.                     [facility] Goals - 08/10/17 1351      CC Long Term Goal  #2   Title  Pt will demonstrate  150 degrees of bilateral shoulder flexion to allow pt to reach items on top shelf and reach up to wash her hair    Status  On-going      CC Long Term Goal  #3   Title  Pt to demonstrate 150 degrees of bilateral shoulder abduction to allow pt to reach out to sides    Status  Achieved      CC Long Term Goal  #4   Title  Pt to be independent in a home exercise program for continued strengthening and stretching.     Status  On-going      CC Long Term Goal  #5   Title  Pt. will report at least 50% decreased discomfort in right axilla/flank area    Status  On-going         Plan - 08/17/17 1716    Clinical Impression Statement  Continues to feel she is benefitting from therapy. She isn't in pain any more.  Still wants to be able to get her shoulders back and stand straighter.    Rehab Potential  Good    Clinical Impairments Affecting Rehab Potential  bilateral breast cancer, hx of radiation; implant exchange surgery 06/16/17    PT Frequency  2x / week    PT Duration  4 weeks    PT Treatment/Interventions  ADLs/Self Care Home Management;Therapeutic exercise;Patient/family education;Manual techniques;Manual lymph drainage;Scar mobilization;Passive range of motion;Taping    PT Next Visit Plan  Check goals; will need renewal next week. Focus on soft tissue work for trigger points at right traps and posterior shoulder and latts Continue P/AA/A/ROM to both shoulders, MLD, soft tissue mobilization, and myofascial release techniques to chest, axilla, and flank areas. Consider foam patch to wear inside fitted camisole that pt usually wears     PT Home Exercise Plan  Avoid pulleys as it may be too aggressive on incsions.     Consulted and Agree with Plan of Care  Patient       Patient will benefit from skilled therapeutic intervention in order to improve the following deficits and impairments:  Increased fascial restricitons, Pain, Postural dysfunction, Decreased scar mobility, Decreased range of  motion, Decreased strength, Impaired UE functional use, Increased edema, Decreased knowledge of precautions  Visit Diagnosis: Stiffness of right shoulder, not elsewhere classified  Stiffness of left shoulder, not elsewhere classified  Abnormal posture  Acute pain of right shoulder  Disorder of the skin and subcutaneous tissue related to radiation, unspecified     Problem List Patient Active Problem List   Diagnosis Date Noted  . Breast cancer ([place]) 03/16/2017  . Malignant neoplasm of upper-inner quadrant of left breast in female, estrogen receptor positive ([place]) 01/03/2017  . HTN (hypertension)  06/14/2016  . Malignant neoplasm of lower-inner quadrant of right breast of female, estrogen receptor positive ([place]) 01/07/2016  . Atherosclerosis of aorta ([place])   . Smoker     Soriyah Osberg 08/17/2017, 5:21 PM  [place], [place], [address] Phone: [phone]   Fax:  [phone]  Name: Krystal Brooks MRN: [id number] Date of Birth: 10-31-1955  Serafina Royals, PT 08/17/17 5:21 PM

## 2017-08-24 ENCOUNTER — Ambulatory Visit: Payer: 59 | Admitting: Physical Therapy

## 2017-08-24 DIAGNOSIS — M25611 Stiffness of right shoulder, not elsewhere classified: Secondary | ICD-10-CM

## 2017-08-24 DIAGNOSIS — L599 Disorder of the skin and subcutaneous tissue related to radiation, unspecified: Secondary | ICD-10-CM

## 2017-08-24 DIAGNOSIS — M25511 Pain in right shoulder: Secondary | ICD-10-CM

## 2017-08-24 DIAGNOSIS — M25612 Stiffness of left shoulder, not elsewhere classified: Secondary | ICD-10-CM

## 2017-08-24 DIAGNOSIS — R293 Abnormal posture: Secondary | ICD-10-CM

## 2017-08-24 NOTE — Therapy (Signed)
[place], [place], [address] Phone: [phone]   Fax:  [phone]  Physical Therapy Treatment  Patient Details  Name: Krystal Brooks MRN: [id number] Date of Birth: 06/05/1956 Referring Provider: Marla Roe   Encounter Date: 08/24/2017  PT End of Session - 08/24/17 1428    Visit Number  19    Number of Visits  20    Date for PT Re-Evaluation  08/29/17    PT Start Time  4431    PT Stop Time  1429    PT Time Calculation (min)  50 min    Activity Tolerance  Patient tolerated treatment well    Behavior During Therapy  [facility] for tasks assessed/performed       Past Medical History:  Diagnosis Date  . Adie's pupil    LEFT EYE  . Allergy   . Anxiety   . Asthma   . Atherosclerosis of aorta (HCC)    bulge above renal arteries on mri 2016  . Cancer [facility]) 2017   right breast ca  . Complication of anesthesia    VERY HARD TO WAKE UP    . Hypertension   . Personal history of radiation therapy   . PONV (postoperative nausea and vomiting)   . Smoker     Past Surgical History:  Procedure Laterality Date  . ABDOMINAL HYSTERECTOMY    . BREAST BIOPSY Right 01/02/2016  . BREAST EXCISIONAL BIOPSY Right 1979  . BREAST LUMPECTOMY Right 01/27/2016  . BREAST LUMPECTOMY WITH RADIOACTIVE SEED AND SENTINEL LYMPH NODE BIOPSY Right 01/27/2016   Procedure: BREAST LUMPECTOMY WITH RADIOACTIVE SEED AND SENTINEL LYMPH NODE BIOPSY;  Surgeon: Excell Seltzer, MD;  Location: [place];  Service: General;  Laterality: Right;  . BREAST RECONSTRUCTION WITH PLACEMENT OF TISSUE EXPANDER AND FLEX HD (ACELLULAR HYDRATED DERMIS) Bilateral 03/16/2017   Procedure: BILATERAL IMMEDIATE BREAST RECONSTRUCTION WITH PLACEMENT OF TISSUE EXPANDER AND FLEX HD (ACELLULAR HYDRATED DERMIS);  Surgeon: Wallace Going, DO;  Location: [place];  Service: Plastics;  Laterality: Bilateral;  . BREAST SURGERY  1979   cyst removed rt  breast  . CATARACT EXTRACTION W/ INTRAOCULAR LENS IMPLANT     RT EYE  . CESAREAN SECTION     x2  . MASTECTOMY W/ SENTINEL NODE BIOPSY Bilateral 03/16/2017   Procedure: BILATERAL TOTAL MASTECTOMY WITH LEFT SENTINEL LYMPH NODE BIOPSY;  Surgeon: Excell Seltzer, MD;  Location: [place];  Service: General;  Laterality: Bilateral;  . REMOVAL OF BILATERAL TISSUE EXPANDERS WITH PLACEMENT OF BILATERAL BREAST IMPLANTS Bilateral 06/15/2017   Procedure: REMOVAL OF BILATERAL TISSUE EXPANDERS WITH PLACEMENT OF BILATERAL BREAST IMPLANTS;  Surgeon: Wallace Going, DO;  Location: [place];  Service: Plastics;  Laterality: Bilateral;  . TOTAL MASTECTOMY Bilateral 03/16/2017   SENTINAL NODE BIOPSY    There were no vitals filed for this visit.  Subjective Assessment - 08/24/17 1341    Subjective  Nothing new.  A little better.    Pertinent History  asthma, 2017 R breast cancer s/p lumpectomy, SLNB and radiation, 2018 L breast cancer s/p bilateral mastectomy and L SLNB bilateral reconstruction 03/16/2017.  Had exchange surgery with permanent implants 06/16/17.    Currently in Pain?  No/denies         [facility] PT Assessment - 08/24/17 0001      AROM   Right Shoulder Flexion  150 Degrees after stretching today    Left Shoulder Flexion  140 Degrees after stretching  [place] Adult PT Treatment/Exercise - 08/24/17 0001      Manual Therapy   Soft tissue mobilization  around right breast implant areas of tightness    Myofascial Release  crosshands in vertical at right shoulder to mid-flank, in diagonal at right shoulder to abdomen, diagonal from sternum to right abdomen; also in vertical at left chest-abdomen and sternum-central abdomen    Passive ROM  in supine, right and left shoulder er, abduction, and flexion to tolerance; right UE myofascial pulling; supine over towel roll, pect minor stretches.      Prosthetics   Current prosthetic weight-bearing tolerance  (hours/day)                         [facility] Goals - 08/24/17 1529      CC Long Term Goal  #1   Title  Pt will be able to independently verbalize lymphedema risk reduction practices    Status  On-going      CC Long Term Goal  #2   Title  Pt will demonstrate 150 degrees of bilateral shoulder flexion to allow pt to reach items on top shelf and reach up to wash her hair    Baseline  Rt. 135, left 129 on re-eval on 07/20/17/ on 08/24/17, 150 on right, 140 on left after stretching    Status  Partially Met      CC Long Term Goal  #3   Title  Pt to demonstrate 150 degrees of bilateral shoulder abduction to allow pt to reach out to sides    Status  Achieved      CC Long Term Goal  #4   Title  Pt to be independent in a home exercise program for continued strengthening and stretching.     Status  On-going      CC Long Term Goal  #5   Title  Pt. will report at least 50% decreased discomfort in right axilla/flank area    Status  On-going         Plan - 08/24/17 1429    Clinical Impression Statement  Pt. did well with vigorous stretching and myofascial release today; left shoulder had some discomfort with ROM. Focus has been more on right in therapy because of her h/o radiation on that side and the tightness she felt there especially after expander placement, but left will need equal attention now.    Rehab Potential  Good    Clinical Impairments Affecting Rehab Potential  bilateral breast cancer, hx of radiation; implant exchange surgery 06/16/17    PT Frequency  2x / week    PT Duration  4 weeks    PT Treatment/Interventions  ADLs/Self Care Home Management;Therapeutic exercise;Patient/family education;Manual techniques;Manual lymph drainage;Scar mobilization;Passive range of motion;Taping    PT Next Visit Plan  Will need renewal next visit. Focus on soft tissue work for trigger points at right traps and posterior shoulder and latts. Continue P/AA/A/ROM to both  shoulders, MLD, soft tissue mobilization, and myofascial release techniques to chest, axilla, and flank areas. Consider foam patch to wear inside fitted camisole that pt usually wears     Consulted and Agree with Plan of Care  Patient       Patient will benefit from skilled therapeutic intervention in order to improve the following deficits and impairments:  Increased fascial restricitons, Pain, Postural dysfunction, Decreased scar mobility, Decreased range of motion, Decreased strength, Impaired UE functional use, Increased edema, Decreased knowledge of precautions  Visit Diagnosis: Stiffness of right shoulder, not elsewhere classified  Stiffness of left shoulder, not elsewhere classified  Abnormal posture  Acute pain of right shoulder  Disorder of the skin and subcutaneous tissue related to radiation, unspecified     Problem List Patient Active Problem List   Diagnosis Date Noted  . Breast cancer ([place]) 03/16/2017  . Malignant neoplasm of upper-inner quadrant of left breast in female, estrogen receptor positive ([place]) 01/03/2017  . HTN (hypertension) 06/14/2016  . Malignant neoplasm of lower-inner quadrant of right breast of female, estrogen receptor positive ([place]) 01/07/2016  . Atherosclerosis of aorta ([place])   . Smoker     Jacquel Mccamish 08/24/2017, 3:31 PM  [place], [place], [address] Phone: [phone]   Fax:  [phone]  Name: KYMORA SCIARA MRN: [id number] Date of Birth: 28-Oct-1955  Serafina Royals, PT 08/24/17 3:31 PM

## 2017-08-26 ENCOUNTER — Ambulatory Visit: Payer: 59 | Admitting: Physical Therapy

## 2017-08-26 DIAGNOSIS — R293 Abnormal posture: Secondary | ICD-10-CM

## 2017-08-26 DIAGNOSIS — M25511 Pain in right shoulder: Secondary | ICD-10-CM

## 2017-08-26 DIAGNOSIS — M25612 Stiffness of left shoulder, not elsewhere classified: Secondary | ICD-10-CM

## 2017-08-26 DIAGNOSIS — M25512 Pain in left shoulder: Secondary | ICD-10-CM

## 2017-08-26 DIAGNOSIS — M25611 Stiffness of right shoulder, not elsewhere classified: Secondary | ICD-10-CM

## 2017-08-26 DIAGNOSIS — L599 Disorder of the skin and subcutaneous tissue related to radiation, unspecified: Secondary | ICD-10-CM

## 2017-08-26 NOTE — Therapy (Signed)
[place], [place], [address] Phone: [phone]   Fax:  [phone]  Physical Therapy Treatment  Patient Details  Name: Krystal Brooks MRN: [id number] Date of Birth: [birth year] Referring Provider: Marla Roe   Encounter Date: 08/26/2017  PT End of Session - 08/26/17 1027    Visit Number  20    Number of Visits  28    Date for PT Re-Evaluation  10/07/17    PT Start Time  0932    PT Stop Time  1017    PT Time Calculation (min)  45 min    Activity Tolerance  Patient tolerated treatment well    Behavior During Therapy  [facility] for tasks assessed/performed       Past Medical History:  Diagnosis Date  . Adie's pupil    LEFT EYE  . Allergy   . Anxiety   . Asthma   . Atherosclerosis of aorta (HCC)    bulge above renal arteries on mri 2016  . Cancer [facility]) 2017   right breast ca  . Complication of anesthesia    VERY HARD TO WAKE UP    . Hypertension   . Personal history of radiation therapy   . PONV (postoperative nausea and vomiting)   . Smoker     Past Surgical History:  Procedure Laterality Date  . ABDOMINAL HYSTERECTOMY    . BREAST BIOPSY Right 01/02/2016  . BREAST EXCISIONAL BIOPSY Right 1979  . BREAST LUMPECTOMY Right 01/27/2016  . BREAST LUMPECTOMY WITH RADIOACTIVE SEED AND SENTINEL LYMPH NODE BIOPSY Right 01/27/2016   Procedure: BREAST LUMPECTOMY WITH RADIOACTIVE SEED AND SENTINEL LYMPH NODE BIOPSY;  Surgeon: Excell Seltzer, MD;  Location: [place];  Service: General;  Laterality: Right;  . BREAST RECONSTRUCTION WITH PLACEMENT OF TISSUE EXPANDER AND FLEX HD (ACELLULAR HYDRATED DERMIS) Bilateral 03/16/2017   Procedure: BILATERAL IMMEDIATE BREAST RECONSTRUCTION WITH PLACEMENT OF TISSUE EXPANDER AND FLEX HD (ACELLULAR HYDRATED DERMIS);  Surgeon: Wallace Going, DO;  Location: [place];  Service: Plastics;  Laterality: Bilateral;  . BREAST SURGERY  1979   cyst removed rt  breast  . CATARACT EXTRACTION W/ INTRAOCULAR LENS IMPLANT     RT EYE  . CESAREAN SECTION     x2  . MASTECTOMY W/ SENTINEL NODE BIOPSY Bilateral 03/16/2017   Procedure: BILATERAL TOTAL MASTECTOMY WITH LEFT SENTINEL LYMPH NODE BIOPSY;  Surgeon: Excell Seltzer, MD;  Location: [place];  Service: General;  Laterality: Bilateral;  . REMOVAL OF BILATERAL TISSUE EXPANDERS WITH PLACEMENT OF BILATERAL BREAST IMPLANTS Bilateral 06/15/2017   Procedure: REMOVAL OF BILATERAL TISSUE EXPANDERS WITH PLACEMENT OF BILATERAL BREAST IMPLANTS;  Surgeon: Wallace Going, DO;  Location: [place];  Service: Plastics;  Laterality: Bilateral;  . TOTAL MASTECTOMY Bilateral 03/16/2017   SENTINAL NODE BIOPSY    There were no vitals filed for this visit.  Subjective Assessment - 08/26/17 0934    Subjective  "I kind of had like a hot spot here yesterday (right upper flank)--it was tender here."    Pertinent History  asthma, 2017 R breast cancer s/p lumpectomy, SLNB and radiation, 2018 L breast cancer s/p bilateral mastectomy and L SLNB bilateral reconstruction 03/16/2017.  Had exchange surgery with permanent implants 06/16/17.    Currently in Pain?  No/denies                      [facility] Adult PT Treatment/Exercise - 08/26/17 0001      Shoulder  Exercises: Sidelying   Other Sidelying Exercises  in left sidelying, right arm up and out as far as able, trying to take scapula into retraction and shoulder into horizontal abduction/ D2, holding for 10 counts and doing 5 reps; then same on other side      Manual Therapy   Myofascial Release  crosshands in vertical at right shoulder to right abdomen, in diagonal at right shoulder to abdomen, diagonal from sternum to right abdomen; also in vertical at left chest-abdomen and sternum-central abdomen    Passive ROM  in supine, right and left shoulder er, abduction, and flexion to tolerance; right UE myofascial pulling with movement into abduction,  then same on left;                      [facility] Goals - 08/26/17 1033      CC Long Term Goal  #1   Title  Pt will be able to independently verbalize lymphedema risk reduction practices    Status  On-going      CC Long Term Goal  #2   Title  Pt will demonstrate 150 degrees of bilateral shoulder flexion to allow pt to reach items on top shelf and reach up to wash her hair    Baseline  Rt. 135, left 129 on re-eval on 07/20/17/ on 08/24/17, 150 on right, 140 on left after stretching    Status  Partially Met      CC Long Term Goal  #3   Title  Pt to demonstrate 150 degrees of bilateral shoulder abduction to allow pt to reach out to sides    Status  Achieved      CC Long Term Goal  #4   Title  Pt to be independent in a home exercise program for continued strengthening and stretching.     Status  On-going      CC Long Term Goal  #5   Title  Pt. will report at least 50% decreased discomfort in right axilla/flank area    Status  On-going         Plan - 08/26/17 1028    Clinical Impression Statement  Pt. came in today with marked shoulder rounding forward so was reminded to work on keeping shoulders back. Pt. continues to have tight soft tissue that benefits from stretching and myofascial release. This done on both right and left shoulders and chest today again.    Rehab Potential  Good    Clinical Impairments Affecting Rehab Potential  bilateral breast cancer, hx of radiation; implant exchange surgery 06/16/17    PT Frequency  2x / week    PT Duration  4 weeks    PT Treatment/Interventions  ADLs/Self Care Home Management;Therapeutic exercise;Patient/family education;Manual techniques;Manual lymph drainage;Scar mobilization;Passive range of motion;Taping    PT Next Visit Plan  Focus on soft tissue work for trigger points at right traps and posterior shoulder and latts. Continue P/AA/A/ROM to both shoulders, soft tissue mobilization, and myofascial release techniques  to chest, axilla, and flank areas.    Consulted and Agree with Plan of Care  Patient       Patient will benefit from skilled therapeutic intervention in order to improve the following deficits and impairments:  Increased fascial restricitons, Pain, Postural dysfunction, Decreased scar mobility, Decreased range of motion, Decreased strength, Impaired UE functional use, Increased edema, Decreased knowledge of precautions  Visit Diagnosis: Stiffness of right shoulder, not elsewhere classified - Plan: PT plan of care  cert/re-cert  Stiffness of left shoulder, not elsewhere classified - Plan: PT plan of care cert/re-cert  Abnormal posture - Plan: PT plan of care cert/re-cert  Acute pain of right shoulder - Plan: PT plan of care cert/re-cert  Disorder of the skin and subcutaneous tissue related to radiation, unspecified - Plan: PT plan of care cert/re-cert  Acute pain of left shoulder - Plan: PT plan of care cert/re-cert     Problem List Patient Active Problem List   Diagnosis Date Noted  . Breast cancer ([place]) 03/16/2017  . Malignant neoplasm of upper-inner quadrant of left breast in female, estrogen receptor positive ([place]) 01/03/2017  . HTN (hypertension) 06/14/2016  . Malignant neoplasm of lower-inner quadrant of right breast of female, estrogen receptor positive ([place]) 01/07/2016  . Atherosclerosis of aorta ([place])   . Smoker     Jairen Goldfarb 08/26/2017, 10:36 AM  [place], [place], [address] Phone: [phone]   Fax:  [phone]  Name: Krystal Brooks MRN: [id number] Date of Birth: 25-Sep-1955  Serafina Royals, PT 08/26/17 10:36 AM

## 2017-09-06 ENCOUNTER — Ambulatory Visit: Payer: 59 | Attending: Plastic Surgery | Admitting: Physical Therapy

## 2017-09-06 ENCOUNTER — Encounter: Payer: Self-pay | Admitting: Physical Therapy

## 2017-09-06 DIAGNOSIS — L599 Disorder of the skin and subcutaneous tissue related to radiation, unspecified: Secondary | ICD-10-CM | POA: Insufficient documentation

## 2017-09-06 DIAGNOSIS — R293 Abnormal posture: Secondary | ICD-10-CM | POA: Diagnosis present

## 2017-09-06 DIAGNOSIS — M25612 Stiffness of left shoulder, not elsewhere classified: Secondary | ICD-10-CM

## 2017-09-06 DIAGNOSIS — M25511 Pain in right shoulder: Secondary | ICD-10-CM | POA: Diagnosis present

## 2017-09-06 DIAGNOSIS — M25512 Pain in left shoulder: Secondary | ICD-10-CM | POA: Diagnosis present

## 2017-09-06 DIAGNOSIS — M25611 Stiffness of right shoulder, not elsewhere classified: Secondary | ICD-10-CM

## 2017-09-06 NOTE — Therapy (Signed)
[place], [place], [address] Phone: [phone]   Fax:  [phone]  Physical Therapy Treatment  Patient Details  Name: Krystal Brooks MRN: [id number] Date of Birth: May 06, 1956 Referring Provider: Marla Roe   Encounter Date: 09/06/2017    Past Medical History:  Diagnosis Date  . Adie's pupil    LEFT EYE  . Allergy   . Anxiety   . Asthma   . Atherosclerosis of aorta (HCC)    bulge above renal arteries on mri 2016  . Cancer [facility]) 2017   right breast ca  . Complication of anesthesia    VERY HARD TO WAKE UP    . Hypertension   . Personal history of radiation therapy   . PONV (postoperative nausea and vomiting)   . Smoker     Past Surgical History:  Procedure Laterality Date  . ABDOMINAL HYSTERECTOMY    . BREAST BIOPSY Right 01/02/2016  . BREAST EXCISIONAL BIOPSY Right 1979  . BREAST LUMPECTOMY Right 01/27/2016  . BREAST LUMPECTOMY WITH RADIOACTIVE SEED AND SENTINEL LYMPH NODE BIOPSY Right 01/27/2016   Procedure: BREAST LUMPECTOMY WITH RADIOACTIVE SEED AND SENTINEL LYMPH NODE BIOPSY;  Surgeon: Excell Seltzer, MD;  Location: [place];  Service: General;  Laterality: Right;  . BREAST RECONSTRUCTION WITH PLACEMENT OF TISSUE EXPANDER AND FLEX HD (ACELLULAR HYDRATED DERMIS) Bilateral 03/16/2017   Procedure: BILATERAL IMMEDIATE BREAST RECONSTRUCTION WITH PLACEMENT OF TISSUE EXPANDER AND FLEX HD (ACELLULAR HYDRATED DERMIS);  Surgeon: Wallace Going, DO;  Location: [place];  Service: Plastics;  Laterality: Bilateral;  . BREAST SURGERY  1979   cyst removed rt breast  . CATARACT EXTRACTION W/ INTRAOCULAR LENS IMPLANT     RT EYE  . CESAREAN SECTION     x2  . MASTECTOMY W/ SENTINEL NODE BIOPSY Bilateral 03/16/2017   Procedure: BILATERAL TOTAL MASTECTOMY WITH LEFT SENTINEL LYMPH NODE BIOPSY;  Surgeon: Excell Seltzer, MD;  Location: [place];  Service: General;  Laterality: Bilateral;   . REMOVAL OF BILATERAL TISSUE EXPANDERS WITH PLACEMENT OF BILATERAL BREAST IMPLANTS Bilateral 06/15/2017   Procedure: REMOVAL OF BILATERAL TISSUE EXPANDERS WITH PLACEMENT OF BILATERAL BREAST IMPLANTS;  Surgeon: Wallace Going, DO;  Location: [place];  Service: Plastics;  Laterality: Bilateral;  . TOTAL MASTECTOMY Bilateral 03/16/2017   SENTINAL NODE BIOPSY    There were no vitals filed for this visit.  Subjective Assessment - 09/06/17 1353    Subjective  "I'm okay" .  She always has burning under the right breast   She has been busy with family over the past few weeks and she is tired.      Pertinent History  asthma, 2017 R breast cancer s/p lumpectomy, SLNB and radiation, 2018 L breast cancer s/p bilateral mastectomy and L SLNB bilateral reconstruction 03/16/2017.  Had exchange surgery with permanent implants 06/16/17.    Patient Stated Goals  try to get the chest muscle tightness loosened up; not having the ROM problems    Currently in Pain?  No/denies                      [facility] Adult PT Treatment/Exercise - 09/06/17 0001      Manual Therapy   Soft tissue mobilization  in left sidelying, with biotone to tight ropy areas at right upper trap and latts, especially at right lateral cervical muscles.     Myofascial Release  crosshands in vertical at right shoulder to right abdomen, in diagonal  at right shoulder to abdomen, diagonal from sternum to right abdomen; also in vertical at left chest-abdomen and sternum-central abdomen    Manual Traction  briefly to cervical area in supine                      [facility] Goals - 08/26/17 1033      CC Long Term Goal  #1   Title  Pt will be able to independently verbalize lymphedema risk reduction practices    Status  On-going      CC Long Term Goal  #2   Title  Pt will demonstrate 150 degrees of bilateral shoulder flexion to allow pt to reach items on top shelf and reach up to wash her  hair    Baseline  Rt. 135, left 129 on re-eval on 07/20/17/ on 08/24/17, 150 on right, 140 on left after stretching    Status  Partially Met      CC Long Term Goal  #3   Title  Pt to demonstrate 150 degrees of bilateral shoulder abduction to allow pt to reach out to sides    Status  Achieved      CC Long Term Goal  #4   Title  Pt to be independent in a home exercise program for continued strengthening and stretching.     Status  On-going      CC Long Term Goal  #5   Title  Pt. will report at least 50% decreased discomfort in right axilla/flank area    Status  On-going           Patient will benefit from skilled therapeutic intervention in order to improve the following deficits and impairments:     Visit Diagnosis: Stiffness of right shoulder, not elsewhere classified  Stiffness of left shoulder, not elsewhere classified  Abnormal posture  Acute pain of right shoulder  Disorder of the skin and subcutaneous tissue related to radiation, unspecified     Problem List Patient Active Problem List   Diagnosis Date Noted  . Breast cancer ([place]) 03/16/2017  . Malignant neoplasm of upper-inner quadrant of left breast in female, estrogen receptor positive ([place]) 01/03/2017  . HTN (hypertension) 06/14/2016  . Malignant neoplasm of lower-inner quadrant of right breast of female, estrogen receptor positive ([place]) 01/07/2016  . Atherosclerosis of aorta ([place])   . Smoker    Donato Heinz. Owens Shark PT  Norwood Levo 09/06/2017, 5:21 PM  [place], [place], [address] Phone: [phone]   Fax:  [phone]  Name: Krystal Brooks MRN: [id number] Date of Birth: 08/30/1956

## 2017-09-08 ENCOUNTER — Other Ambulatory Visit: Payer: Self-pay | Admitting: Family Medicine

## 2017-09-09 ENCOUNTER — Ambulatory Visit: Payer: 59 | Admitting: Physical Therapy

## 2017-09-12 ENCOUNTER — Ambulatory Visit: Payer: 59 | Admitting: Physical Therapy

## 2017-09-12 DIAGNOSIS — M25612 Stiffness of left shoulder, not elsewhere classified: Secondary | ICD-10-CM

## 2017-09-12 DIAGNOSIS — M25611 Stiffness of right shoulder, not elsewhere classified: Secondary | ICD-10-CM

## 2017-09-12 DIAGNOSIS — M25512 Pain in left shoulder: Secondary | ICD-10-CM

## 2017-09-12 DIAGNOSIS — M25511 Pain in right shoulder: Secondary | ICD-10-CM

## 2017-09-12 DIAGNOSIS — L599 Disorder of the skin and subcutaneous tissue related to radiation, unspecified: Secondary | ICD-10-CM

## 2017-09-12 DIAGNOSIS — R293 Abnormal posture: Secondary | ICD-10-CM

## 2017-09-12 NOTE — Therapy (Signed)
[place], [place], [address] Phone: [phone]   Fax:  [phone]  Physical Therapy Treatment  Patient Details  Name: Krystal Brooks MRN: [id number] Date of Birth: Nov 22, 1955 Referring Provider: Marla Roe   Encounter Date: 09/12/2017  PT End of Session - 09/12/17 1711    Visit Number  21    Number of Visits  28    Date for PT Re-Evaluation  10/07/17    PT Start Time  5638    PT Stop Time  1434    PT Time Calculation (min)  45 min    Activity Tolerance  Patient tolerated treatment well    Behavior During Therapy  [facility] for tasks assessed/performed       Past Medical History:  Diagnosis Date  . Adie's pupil    LEFT EYE  . Allergy   . Anxiety   . Asthma   . Atherosclerosis of aorta (HCC)    bulge above renal arteries on mri 2016  . Cancer [facility]) 2017   right breast ca  . Complication of anesthesia    VERY HARD TO WAKE UP    . Hypertension   . Personal history of radiation therapy   . PONV (postoperative nausea and vomiting)   . Smoker     Past Surgical History:  Procedure Laterality Date  . ABDOMINAL HYSTERECTOMY    . BREAST BIOPSY Right 01/02/2016  . BREAST EXCISIONAL BIOPSY Right 1979  . BREAST LUMPECTOMY Right 01/27/2016  . BREAST LUMPECTOMY WITH RADIOACTIVE SEED AND SENTINEL LYMPH NODE BIOPSY Right 01/27/2016   Procedure: BREAST LUMPECTOMY WITH RADIOACTIVE SEED AND SENTINEL LYMPH NODE BIOPSY;  Surgeon: Excell Seltzer, MD;  Location: [place];  Service: General;  Laterality: Right;  . BREAST RECONSTRUCTION WITH PLACEMENT OF TISSUE EXPANDER AND FLEX HD (ACELLULAR HYDRATED DERMIS) Bilateral 03/16/2017   Procedure: BILATERAL IMMEDIATE BREAST RECONSTRUCTION WITH PLACEMENT OF TISSUE EXPANDER AND FLEX HD (ACELLULAR HYDRATED DERMIS);  Surgeon: Wallace Going, DO;  Location: [place];  Service: Plastics;  Laterality: Bilateral;  . BREAST SURGERY  1979   cyst removed rt  breast  . CATARACT EXTRACTION W/ INTRAOCULAR LENS IMPLANT     RT EYE  . CESAREAN SECTION     x2  . MASTECTOMY W/ SENTINEL NODE BIOPSY Bilateral 03/16/2017   Procedure: BILATERAL TOTAL MASTECTOMY WITH LEFT SENTINEL LYMPH NODE BIOPSY;  Surgeon: Excell Seltzer, MD;  Location: [place];  Service: General;  Laterality: Bilateral;  . REMOVAL OF BILATERAL TISSUE EXPANDERS WITH PLACEMENT OF BILATERAL BREAST IMPLANTS Bilateral 06/15/2017   Procedure: REMOVAL OF BILATERAL TISSUE EXPANDERS WITH PLACEMENT OF BILATERAL BREAST IMPLANTS;  Surgeon: Wallace Going, DO;  Location: [place];  Service: Plastics;  Laterality: Bilateral;  . TOTAL MASTECTOMY Bilateral 03/16/2017   SENTINAL NODE BIOPSY    There were no vitals filed for this visit.  Subjective Assessment - 09/12/17 1351    Subjective  Went to the plastic surgeon Friday, that's why I had to cancel my appointment with you on Friday.  The plastic surgeon feels like we can't wait until March for surgery--so our trip to [place] is out.  I'm now having surgery February 6th, IF I decide to go ahead with it.  I'm going to [facility] for a second opinion.     Pertinent History  asthma, 2017 R breast cancer s/p lumpectomy, SLNB and radiation, 2018 L breast cancer s/p bilateral mastectomy and L SLNB bilateral reconstruction 03/16/2017.  Had exchange surgery  with permanent implants 06/16/17.    Patient Stated Goals  try to get the chest muscle tightness loosened up; not having the ROM problems    Currently in Pain?  Yes    Pain Score  4     Pain Location  Breast    Pain Orientation  Right;Left    Pain Descriptors / Indicators  Discomfort                      [facility] Adult PT Treatment/Exercise - 09/12/17 0001      Manual Therapy   Myofascial Release  crosshands at upper chest in horizontal    Passive ROM  in supine, bilat. pect minor stretches; shoulder PROM to tolerance bilat.    Other Manual Therapy  In sitting, soft  tissue work with Biotone to bilat. neck and upper back significant muscle tightness palpated bilat.                     [facility] Goals - 08/26/17 1033      CC Long Term Goal  #1   Title  Pt will be able to independently verbalize lymphedema risk reduction practices    Status  On-going      CC Long Term Goal  #2   Title  Pt will demonstrate 150 degrees of bilateral shoulder flexion to allow pt to reach items on top shelf and reach up to wash her hair    Baseline  Rt. 135, left 129 on re-eval on 07/20/17/ on 08/24/17, 150 on right, 140 on left after stretching    Status  Partially Met      CC Long Term Goal  #3   Title  Pt to demonstrate 150 degrees of bilateral shoulder abduction to allow pt to reach out to sides    Status  Achieved      CC Long Term Goal  #4   Title  Pt to be independent in a home exercise program for continued strengthening and stretching.     Status  On-going      CC Long Term Goal  #5   Title  Pt. will report at least 50% decreased discomfort in right axilla/flank area    Status  On-going         Plan - 09/12/17 1712    Clinical Impression Statement  Pt. came in today feeling anxious because her meeting with the plastic surgeon last week gave her concerns about complications to her reconstruction surgery, and caused her to cancel a trip to [place] that she has planned.  Pt. now hopes to get a second opinion to be assured about taking the right course for her with surgery.  She was also hesitant to have stretching be as vigorous today as it has been. Soft tissue work to upper back and neck revealed significant muscle tightness; suggested to patient that she may want to consider getting a massage for that.    Rehab Potential  Good    Clinical Impairments Affecting Rehab Potential  bilateral breast cancer, hx of radiation; implant exchange surgery 06/16/17    PT Frequency  2x / week    PT Duration  4 weeks    PT Treatment/Interventions   ADLs/Self Care Home Management;Therapeutic exercise;Patient/family education;Manual techniques;Manual lymph drainage;Scar mobilization;Passive range of motion;Taping    PT Next Visit Plan  Continue soft tissue work for trigger points at right traps and posterior shoulder and latts. Continue P/AA/A/ROM to both shoulders, soft  tissue mobilization, and myofascial release techniques to chest, axilla, and flank areas.    Consulted and Agree with Plan of Care  Patient       Patient will benefit from skilled therapeutic intervention in order to improve the following deficits and impairments:  Increased fascial restricitons, Pain, Postural dysfunction, Decreased scar mobility, Decreased range of motion, Decreased strength, Impaired UE functional use, Increased edema, Decreased knowledge of precautions  Visit Diagnosis: Stiffness of right shoulder, not elsewhere classified  Stiffness of left shoulder, not elsewhere classified  Abnormal posture  Acute pain of right shoulder  Disorder of the skin and subcutaneous tissue related to radiation, unspecified  Acute pain of left shoulder     Problem List Patient Active Problem List   Diagnosis Date Noted  . Breast cancer ([place]) 03/16/2017  . Malignant neoplasm of upper-inner quadrant of left breast in female, estrogen receptor positive ([place]) 01/03/2017  . HTN (hypertension) 06/14/2016  . Malignant neoplasm of lower-inner quadrant of right breast of female, estrogen receptor positive ([place]) 01/07/2016  . Atherosclerosis of aorta ([place])   . Smoker     SALISBURY,DONNA 09/12/2017, 5:16 PM  [place], [place], [address] Phone: [phone]   Fax:  [phone]  Name: NEELAH MANNINGS MRN: [id number] Date of Birth: [birth year]  Serafina Royals, PT 09/12/17 5:16 PM

## 2017-09-14 ENCOUNTER — Encounter: Payer: Self-pay | Admitting: Physical Therapy

## 2017-09-14 ENCOUNTER — Ambulatory Visit: Payer: 59 | Admitting: Physical Therapy

## 2017-09-14 DIAGNOSIS — M25611 Stiffness of right shoulder, not elsewhere classified: Secondary | ICD-10-CM

## 2017-09-14 DIAGNOSIS — M25512 Pain in left shoulder: Secondary | ICD-10-CM

## 2017-09-14 DIAGNOSIS — M25612 Stiffness of left shoulder, not elsewhere classified: Secondary | ICD-10-CM

## 2017-09-14 DIAGNOSIS — R293 Abnormal posture: Secondary | ICD-10-CM

## 2017-09-14 DIAGNOSIS — M25511 Pain in right shoulder: Secondary | ICD-10-CM

## 2017-09-14 DIAGNOSIS — L599 Disorder of the skin and subcutaneous tissue related to radiation, unspecified: Secondary | ICD-10-CM

## 2017-09-14 NOTE — Therapy (Signed)
[place], [place], [address] Phone: [phone]   Fax:  [phone]  Physical Therapy Treatment  Patient Details  Name: Krystal Brooks MRN: [id number] Date of Birth: 03/22/1956 Referring Provider: Marla Roe   Encounter Date: 09/14/2017  PT End of Session - 09/14/17 1220    Visit Number  22    Number of Visits  28    Date for PT Re-Evaluation  10/07/17    PT Start Time  0930    PT Stop Time  1015    PT Time Calculation (min)  45 min    Activity Tolerance  Patient tolerated treatment well    Behavior During Therapy  [facility] for tasks assessed/performed       Past Medical History:  Diagnosis Date  . Adie's pupil    LEFT EYE  . Allergy   . Anxiety   . Asthma   . Atherosclerosis of aorta (HCC)    bulge above renal arteries on mri 2016  . Cancer [facility]) 2017   right breast ca  . Complication of anesthesia    VERY HARD TO WAKE UP    . Hypertension   . Personal history of radiation therapy   . PONV (postoperative nausea and vomiting)   . Smoker     Past Surgical History:  Procedure Laterality Date  . ABDOMINAL HYSTERECTOMY    . BREAST BIOPSY Right 01/02/2016  . BREAST EXCISIONAL BIOPSY Right 1979  . BREAST LUMPECTOMY Right 01/27/2016  . BREAST LUMPECTOMY WITH RADIOACTIVE SEED AND SENTINEL LYMPH NODE BIOPSY Right 01/27/2016   Procedure: BREAST LUMPECTOMY WITH RADIOACTIVE SEED AND SENTINEL LYMPH NODE BIOPSY;  Surgeon: Excell Seltzer, MD;  Location: [place];  Service: General;  Laterality: Right;  . BREAST RECONSTRUCTION WITH PLACEMENT OF TISSUE EXPANDER AND FLEX HD (ACELLULAR HYDRATED DERMIS) Bilateral 03/16/2017   Procedure: BILATERAL IMMEDIATE BREAST RECONSTRUCTION WITH PLACEMENT OF TISSUE EXPANDER AND FLEX HD (ACELLULAR HYDRATED DERMIS);  Surgeon: Wallace Going, DO;  Location: [place];  Service: Plastics;  Laterality: Bilateral;  . BREAST SURGERY  1979   cyst removed rt  breast  . CATARACT EXTRACTION W/ INTRAOCULAR LENS IMPLANT     RT EYE  . CESAREAN SECTION     x2  . MASTECTOMY W/ SENTINEL NODE BIOPSY Bilateral 03/16/2017   Procedure: BILATERAL TOTAL MASTECTOMY WITH LEFT SENTINEL LYMPH NODE BIOPSY;  Surgeon: Excell Seltzer, MD;  Location: [place];  Service: General;  Laterality: Bilateral;  . REMOVAL OF BILATERAL TISSUE EXPANDERS WITH PLACEMENT OF BILATERAL BREAST IMPLANTS Bilateral 06/15/2017   Procedure: REMOVAL OF BILATERAL TISSUE EXPANDERS WITH PLACEMENT OF BILATERAL BREAST IMPLANTS;  Surgeon: Wallace Going, DO;  Location: [place];  Service: Plastics;  Laterality: Bilateral;  . TOTAL MASTECTOMY Bilateral 03/16/2017   SENTINAL NODE BIOPSY    There were no vitals filed for this visit.  Subjective Assessment - 09/14/17 1014    Subjective  Pt is hoping to get a second opinion from [place] to help her decide what type of surgery she will have on Feb. 6... either removal of implants or latt flap on right     Patient Stated Goals  try to get the chest muscle tightness loosened up; not having the ROM problems    Currently in Pain?  Yes    Pain Score  4     Pain Location  Breast    Pain Orientation  Right;Left    Pain Descriptors / Indicators  Discomfort  Pain Type  Chronic pain                      [facility] Adult PT Treatment/Exercise - 09/14/17 0001      Manual Therapy   Soft tissue mobilization  in left sidelying, with biotone to tight ropy areas at right upper trap and latts, especially at right lateral cervical muscles.  and also to left back an posterior axilla                      [facility] Goals - 08/26/17 1033      CC Long Term Goal  #1   Title  Pt will be able to independently verbalize lymphedema risk reduction practices    Status  On-going      CC Long Term Goal  #2   Title  Pt will demonstrate 150 degrees of bilateral shoulder flexion to allow pt to reach items on top shelf and  reach up to wash her hair    Baseline  Rt. 135, left 129 on re-eval on 07/20/17/ on 08/24/17, 150 on right, 140 on left after stretching    Status  Partially Met      CC Long Term Goal  #3   Title  Pt to demonstrate 150 degrees of bilateral shoulder abduction to allow pt to reach out to sides    Status  Achieved      CC Long Term Goal  #4   Title  Pt to be independent in a home exercise program for continued strengthening and stretching.     Status  On-going      CC Long Term Goal  #5   Title  Pt. will report at least 50% decreased discomfort in right axilla/flank area    Status  On-going         Plan - 09/14/17 1221    Clinical Impression Statement  Pt continues to have pain in neck and upper back that is improved with soft work.   Avoided chest due to fragile skin at right lateral breast     Clinical Impairments Affecting Rehab Potential  bilateral breast cancer, hx of radiation; implant exchange surgery 06/16/17    PT Frequency  2x / week    PT Duration  4 weeks    PT Next Visit Plan  Continue soft tissue work for trigger points at right traps and posterior shoulder and latts. Continue P/AA/A/ROM to both shoulders, soft tissue mobilization, and myofascial release techniques to chest, axilla, and flank areas.       Patient will benefit from skilled therapeutic intervention in order to improve the following deficits and impairments:  Increased fascial restricitons, Pain, Postural dysfunction, Decreased scar mobility, Decreased range of motion, Decreased strength, Impaired UE functional use, Increased edema, Decreased knowledge of precautions  Visit Diagnosis: Stiffness of right shoulder, not elsewhere classified  Stiffness of left shoulder, not elsewhere classified  Abnormal posture  Acute pain of right shoulder  Disorder of the skin and subcutaneous tissue related to radiation, unspecified  Acute pain of left shoulder     Problem List Patient Active Problem List    Diagnosis Date Noted  . Breast cancer ([place]) 03/16/2017  . Malignant neoplasm of upper-inner quadrant of left breast in female, estrogen receptor positive ([place]) 01/03/2017  . HTN (hypertension) 06/14/2016  . Malignant neoplasm of lower-inner quadrant of right breast of female, estrogen receptor positive ([place]) 01/07/2016  . Atherosclerosis of aorta ([place])   .  Smoker    Donato Heinz. Owens Shark PT  Norwood Levo 09/14/2017, 12:23 PM  [place], [place], [address] Phone: [phone]   Fax:  [phone]  Name: Krystal Brooks MRN: [id number] Date of Birth: April 15, 1956

## 2017-09-16 ENCOUNTER — Telehealth: Payer: Self-pay | Admitting: Oncology

## 2017-09-16 NOTE — Telephone Encounter (Signed)
[facility] PAL 2/15. Patient also on schedule for lab/GM 5/14. Per instruction from [facility] for 2/15 bump list f/u should be May not February. Lab/LC 2/15 cxd. Spoke with patient she is aware. Confirmed 5/14 lab/GM.

## 2017-09-19 ENCOUNTER — Ambulatory Visit: Payer: 59 | Admitting: Physical Therapy

## 2017-09-19 DIAGNOSIS — M25612 Stiffness of left shoulder, not elsewhere classified: Secondary | ICD-10-CM

## 2017-09-19 DIAGNOSIS — M25511 Pain in right shoulder: Secondary | ICD-10-CM

## 2017-09-19 DIAGNOSIS — R293 Abnormal posture: Secondary | ICD-10-CM

## 2017-09-19 DIAGNOSIS — M25611 Stiffness of right shoulder, not elsewhere classified: Secondary | ICD-10-CM

## 2017-09-19 NOTE — Therapy (Signed)
[place], [place], [address] Phone: [phone]   Fax:  [phone]  Physical Therapy Treatment  Patient Details  Name: Krystal Brooks MRN: [id number] Date of Birth: 1956-01-19 Referring Provider: Marla Roe   Encounter Date: 09/19/2017  PT End of Session - 09/19/17 1431    Visit Number  23    Number of Visits  28    Date for PT Re-Evaluation  10/07/17    PT Start Time  0569    PT Stop Time  1429    PT Time Calculation (min)  40 min    Activity Tolerance  Patient tolerated treatment well    Behavior During Therapy  [facility] for tasks assessed/performed       Past Medical History:  Diagnosis Date  . Adie's pupil    LEFT EYE  . Allergy   . Anxiety   . Asthma   . Atherosclerosis of aorta (HCC)    bulge above renal arteries on mri 2016  . Cancer [facility]) 2017   right breast ca  . Complication of anesthesia    VERY HARD TO WAKE UP    . Hypertension   . Personal history of radiation therapy   . PONV (postoperative nausea and vomiting)   . Smoker     Past Surgical History:  Procedure Laterality Date  . ABDOMINAL HYSTERECTOMY    . BREAST BIOPSY Right 01/02/2016  . BREAST EXCISIONAL BIOPSY Right 1979  . BREAST LUMPECTOMY Right 01/27/2016  . BREAST LUMPECTOMY WITH RADIOACTIVE SEED AND SENTINEL LYMPH NODE BIOPSY Right 01/27/2016   Procedure: BREAST LUMPECTOMY WITH RADIOACTIVE SEED AND SENTINEL LYMPH NODE BIOPSY;  Surgeon: Excell Seltzer, MD;  Location: [place];  Service: General;  Laterality: Right;  . BREAST RECONSTRUCTION WITH PLACEMENT OF TISSUE EXPANDER AND FLEX HD (ACELLULAR HYDRATED DERMIS) Bilateral 03/16/2017   Procedure: BILATERAL IMMEDIATE BREAST RECONSTRUCTION WITH PLACEMENT OF TISSUE EXPANDER AND FLEX HD (ACELLULAR HYDRATED DERMIS);  Surgeon: Wallace Going, DO;  Location: [place];  Service: Plastics;  Laterality: Bilateral;  . BREAST SURGERY  1979   cyst removed rt  breast  . CATARACT EXTRACTION W/ INTRAOCULAR LENS IMPLANT     RT EYE  . CESAREAN SECTION     x2  . MASTECTOMY W/ SENTINEL NODE BIOPSY Bilateral 03/16/2017   Procedure: BILATERAL TOTAL MASTECTOMY WITH LEFT SENTINEL LYMPH NODE BIOPSY;  Surgeon: Excell Seltzer, MD;  Location: [place] [facility] [place];  Service: General;  Laterality: Bilateral;  . REMOVAL OF BILATERAL TISSUE EXPANDERS WITH PLACEMENT OF BILATERAL BREAST IMPLANTS Bilateral 06/15/2017   Procedure: REMOVAL OF BILATERAL TISSUE EXPANDERS WITH PLACEMENT OF BILATERAL BREAST IMPLANTS;  Surgeon: Wallace Going, DO;  Location: [place];  Service: Plastics;  Laterality: Bilateral;  . TOTAL MASTECTOMY Bilateral 03/16/2017   SENTINAL NODE BIOPSY    There were no vitals filed for this visit.  Subjective Assessment - 09/19/17 1352    Subjective  Has an appointment at [facility] for second opinion on 09/26/17. Pt. feels like the massage helps her the most.    Pertinent History  asthma, 2017 R breast cancer s/p lumpectomy, SLNB and radiation, 2018 L breast cancer s/p bilateral mastectomy and L SLNB bilateral reconstruction 03/16/2017.  Had exchange surgery with permanent implants 06/16/17.    Currently in Pain?  No/denies just uncomfortable                      [facility] Adult PT Treatment/Exercise - 09/19/17 0001  Manual Therapy   Other Manual Therapy  In sitting with pillow supporting arms, soft tissue work with Biotone to neck, upper to mid back, and extension out to lateral scapular areas                     [facility] Goals - 08/26/17 1033      CC Long Term Goal  #1   Title  Pt will be able to independently verbalize lymphedema risk reduction practices    Status  On-going      CC Long Term Goal  #2   Title  Pt will demonstrate 150 degrees of bilateral shoulder flexion to allow pt to reach items on top shelf and reach up to wash her hair    Baseline  Rt. 135, left 129 on re-eval on 07/20/17/ on  08/24/17, 150 on right, 140 on left after stretching    Status  Partially Met      CC Long Term Goal  #3   Title  Pt to demonstrate 150 degrees of bilateral shoulder abduction to allow pt to reach out to sides    Status  Achieved      CC Long Term Goal  #4   Title  Pt to be independent in a home exercise program for continued strengthening and stretching.     Status  On-going      CC Long Term Goal  #5   Title  Pt. will report at least 50% decreased discomfort in right axilla/flank area    Status  On-going         Plan - 09/19/17 1431    Clinical Impression Statement  Pt. reported the most benefit from soft tissue work previously, so that was the focus again today.  Did this in sitting today.    Rehab Potential  Good    Clinical Impairments Affecting Rehab Potential  bilateral breast cancer, hx of radiation; implant exchange surgery 06/16/17    PT Frequency  2x / week    PT Duration  4 weeks    PT Treatment/Interventions  ADLs/Self Care Home Management;Therapeutic exercise;Patient/family education;Manual techniques;Manual lymph drainage;Scar mobilization;Passive range of motion;Taping    PT Next Visit Plan  Continue soft tissue work for trigger points at right traps and posterior shoulder and latts. Continue P/AA/A/ROM to both shoulders, soft tissue mobilization, and myofascial release techniques to chest, axilla, and flank areas.    PT Home Exercise Plan  Avoid pulleys as it may be too aggressive on incsions.     Consulted and Agree with Plan of Care  Patient       Patient will benefit from skilled therapeutic intervention in order to improve the following deficits and impairments:  Increased fascial restricitons, Pain, Postural dysfunction, Decreased scar mobility, Decreased range of motion, Decreased strength, Impaired UE functional use, Increased edema, Decreased knowledge of precautions  Visit Diagnosis: Stiffness of right shoulder, not elsewhere classified  Stiffness of  left shoulder, not elsewhere classified  Abnormal posture  Acute pain of right shoulder     Problem List Patient Active Problem List   Diagnosis Date Noted  . Breast cancer ([place]) 03/16/2017  . Malignant neoplasm of upper-inner quadrant of left breast in female, estrogen receptor positive ([place]) 01/03/2017  . HTN (hypertension) 06/14/2016  . Malignant neoplasm of lower-inner quadrant of right breast of female, estrogen receptor positive ([place]) 01/07/2016  . Atherosclerosis of aorta ([place])   . Smoker     SALISBURY,DONNA 09/19/2017, 2:34 PM  [place], [place], [address] Phone: [phone]   Fax:  [phone]  Name: SHALISHA CLAUSING MRN: [id number] Date of Birth: 13-Oct-1955  Serafina Royals, PT 09/19/17 2:34 PM

## 2017-09-21 ENCOUNTER — Ambulatory Visit: Payer: 59 | Admitting: Physical Therapy

## 2017-09-21 ENCOUNTER — Encounter: Payer: Self-pay | Admitting: Physical Therapy

## 2017-09-21 DIAGNOSIS — L599 Disorder of the skin and subcutaneous tissue related to radiation, unspecified: Secondary | ICD-10-CM

## 2017-09-21 DIAGNOSIS — M25511 Pain in right shoulder: Secondary | ICD-10-CM

## 2017-09-21 DIAGNOSIS — M25612 Stiffness of left shoulder, not elsewhere classified: Secondary | ICD-10-CM

## 2017-09-21 DIAGNOSIS — M25611 Stiffness of right shoulder, not elsewhere classified: Secondary | ICD-10-CM | POA: Diagnosis not present

## 2017-09-21 DIAGNOSIS — R293 Abnormal posture: Secondary | ICD-10-CM

## 2017-09-21 NOTE — Therapy (Signed)
[place], [place], [address] Phone: [phone]   Fax:  [phone]  Physical Therapy Treatment  Patient Details  Name: Krystal Brooks MRN: [id number] Date of Birth: Jul 12, 1956 Referring Provider: Marla Roe   Encounter Date: 09/21/2017  PT End of Session - 09/21/17 1140    Visit Number  24    Number of Visits  28    Date for PT Re-Evaluation  10/07/17    PT Start Time  0935    PT Stop Time  1015    PT Time Calculation (min)  40 min    Activity Tolerance  Patient tolerated treatment well    Behavior During Therapy  [facility] for tasks assessed/performed       Past Medical History:  Diagnosis Date  . Adie's pupil    LEFT EYE  . Allergy   . Anxiety   . Asthma   . Atherosclerosis of aorta (HCC)    bulge above renal arteries on mri 2016  . Cancer [facility]) 2017   right breast ca  . Complication of anesthesia    VERY HARD TO WAKE UP    . Hypertension   . Personal history of radiation therapy   . PONV (postoperative nausea and vomiting)   . Smoker     Past Surgical History:  Procedure Laterality Date  . ABDOMINAL HYSTERECTOMY    . BREAST BIOPSY Right 01/02/2016  . BREAST EXCISIONAL BIOPSY Right 1979  . BREAST LUMPECTOMY Right 01/27/2016  . BREAST LUMPECTOMY WITH RADIOACTIVE SEED AND SENTINEL LYMPH NODE BIOPSY Right 01/27/2016   Procedure: BREAST LUMPECTOMY WITH RADIOACTIVE SEED AND SENTINEL LYMPH NODE BIOPSY;  Surgeon: Excell Seltzer, MD;  Location: [place];  Service: General;  Laterality: Right;  . BREAST RECONSTRUCTION WITH PLACEMENT OF TISSUE EXPANDER AND FLEX HD (ACELLULAR HYDRATED DERMIS) Bilateral 03/16/2017   Procedure: BILATERAL IMMEDIATE BREAST RECONSTRUCTION WITH PLACEMENT OF TISSUE EXPANDER AND FLEX HD (ACELLULAR HYDRATED DERMIS);  Surgeon: Wallace Going, DO;  Location: [place];  Service: Plastics;  Laterality: Bilateral;  . BREAST SURGERY  1979   cyst removed rt  breast  . CATARACT EXTRACTION W/ INTRAOCULAR LENS IMPLANT     RT EYE  . CESAREAN SECTION     x2  . MASTECTOMY W/ SENTINEL NODE BIOPSY Bilateral 03/16/2017   Procedure: BILATERAL TOTAL MASTECTOMY WITH LEFT SENTINEL LYMPH NODE BIOPSY;  Surgeon: Excell Seltzer, MD;  Location: [place];  Service: General;  Laterality: Bilateral;  . REMOVAL OF BILATERAL TISSUE EXPANDERS WITH PLACEMENT OF BILATERAL BREAST IMPLANTS Bilateral 06/15/2017   Procedure: REMOVAL OF BILATERAL TISSUE EXPANDERS WITH PLACEMENT OF BILATERAL BREAST IMPLANTS;  Surgeon: Wallace Going, DO;  Location: [place];  Service: Plastics;  Laterality: Bilateral;  . TOTAL MASTECTOMY Bilateral 03/16/2017   SENTINAL NODE BIOPSY    There were no vitals filed for this visit.  Subjective Assessment - 09/21/17 0939    Subjective  Pt states her left shoulder is feeling better, She is going for a second opinon tomorrow and also next week     Pertinent History  asthma, 2017 R breast cancer s/p lumpectomy, SLNB and radiation, 2018 L breast cancer s/p bilateral mastectomy and L SLNB bilateral reconstruction 03/16/2017.  Had exchange surgery with permanent implants 06/16/17.    Patient Stated Goals  try to get the chest muscle tightness loosened up; not having the ROM problems  [place] Adult PT Treatment/Exercise - 09/21/17 0001      Neck Exercises: Seated   Other Seated Exercise  Neck ROM with left side bending stretches and shoulder shurgs and posterior scapular rotation       Manual Therapy   Other Manual Therapy  In sitting with pillow supporting arms, soft tissue work with Biotone mixed with Biofeeze  to neck, upper to mid back, and extension out to lateral scapular areas                     [facility] Goals - 08/26/17 1033      CC Long Term Goal  #1   Title  Pt will be able to independently verbalize lymphedema risk reduction practices    Status  On-going       CC Long Term Goal  #2   Title  Pt will demonstrate 150 degrees of bilateral shoulder flexion to allow pt to reach items on top shelf and reach up to wash her hair    Baseline  Rt. 135, left 129 on re-eval on 07/20/17/ on 08/24/17, 150 on right, 140 on left after stretching    Status  Partially Met      CC Long Term Goal  #3   Title  Pt to demonstrate 150 degrees of bilateral shoulder abduction to allow pt to reach out to sides    Status  Achieved      CC Long Term Goal  #4   Title  Pt to be independent in a home exercise program for continued strengthening and stretching.     Status  On-going      CC Long Term Goal  #5   Title  Pt. will report at least 50% decreased discomfort in right axilla/flank area    Status  On-going         Plan - 09/21/17 1140    Clinical Impression Statement  Pt continues with tightness in right upper trap and cervical muscles and get some, but not total, relief from soft tissue work. She always feels better after the sessions and has more comfort for her daily activities     Rehab Potential  Good    Clinical Impairments Affecting Rehab Potential  bilateral breast cancer, hx of radiation; implant exchange surgery 06/16/17    PT Treatment/Interventions  ADLs/Self Care Home Management;Therapeutic exercise;Patient/family education;Manual techniques;Manual lymph drainage;Scar mobilization;Passive range of motion;Taping    PT Next Visit Plan  Assess for goals. Continue soft tissue work for trigger points at right traps and posterior shoulder and latts. Continue P/AA/A/ROM to both shoulders, soft tissue mobilization, and myofascial release techniques to chest, axilla, and flank areas.       Patient will benefit from skilled therapeutic intervention in order to improve the following deficits and impairments:  Increased fascial restricitons, Pain, Postural dysfunction, Decreased scar mobility, Decreased range of motion, Decreased strength, Impaired UE functional  use, Increased edema, Decreased knowledge of precautions  Visit Diagnosis: Stiffness of right shoulder, not elsewhere classified  Stiffness of left shoulder, not elsewhere classified  Abnormal posture  Acute pain of right shoulder  Disorder of the skin and subcutaneous tissue related to radiation, unspecified     Problem List Patient Active Problem List   Diagnosis Date Noted  . Breast cancer ([place]) 03/16/2017  . Malignant neoplasm of upper-inner quadrant of left breast in female, estrogen receptor positive ([place]) 01/03/2017  . HTN (hypertension) 06/14/2016  . Malignant neoplasm of lower-inner quadrant of right  breast of female, estrogen receptor positive ([place]) 01/07/2016  . Atherosclerosis of aorta ([place])   . Smoker    Donato Heinz. Owens Shark PT  Norwood Levo 09/21/2017, 11:43 AM  [place], [place], [address] Phone: [phone]   Fax:  [phone]  Name: Krystal Brooks MRN: [id number] Date of Birth: 10-Jun-1956

## 2017-09-26 ENCOUNTER — Encounter: Payer: 59 | Admitting: Physical Therapy

## 2017-09-27 ENCOUNTER — Encounter (HOSPITAL_BASED_OUTPATIENT_CLINIC_OR_DEPARTMENT_OTHER): Payer: Self-pay | Admitting: *Deleted

## 2017-09-27 ENCOUNTER — Other Ambulatory Visit: Payer: Self-pay

## 2017-09-28 ENCOUNTER — Ambulatory Visit: Payer: 59 | Admitting: Physical Therapy

## 2017-09-28 DIAGNOSIS — M25611 Stiffness of right shoulder, not elsewhere classified: Secondary | ICD-10-CM | POA: Diagnosis not present

## 2017-09-28 DIAGNOSIS — M25511 Pain in right shoulder: Secondary | ICD-10-CM

## 2017-09-28 DIAGNOSIS — M25612 Stiffness of left shoulder, not elsewhere classified: Secondary | ICD-10-CM

## 2017-09-28 DIAGNOSIS — L599 Disorder of the skin and subcutaneous tissue related to radiation, unspecified: Secondary | ICD-10-CM

## 2017-09-28 DIAGNOSIS — R293 Abnormal posture: Secondary | ICD-10-CM

## 2017-09-28 NOTE — Patient Instructions (Signed)

## 2017-09-28 NOTE — Therapy (Signed)
[place], [place], [address] Phone: [phone]   Fax:  [phone]  Physical Therapy Treatment  Patient Details  Name: Krystal Brooks MRN: [id number] Date of Birth: 04-Nov-1955 Referring Provider: Marla Roe   Encounter Date: 09/28/2017  PT End of Session - 09/28/17 1738    Visit Number  25    Number of Visits  28    Date for PT Re-Evaluation  10/07/17    PT Start Time  1103    PT Stop Time  1431    PT Time Calculation (min)  46 min    Activity Tolerance  Patient tolerated treatment well    Behavior During Therapy  [facility] for tasks assessed/performed       Past Medical History:  Diagnosis Date  . Adie's pupil    LEFT EYE  . Allergy   . Anxiety   . Asthma   . Atherosclerosis of aorta (HCC)    bulge above renal arteries on mri 2016  . Cancer [facility]) 2017   right breast ca  . Complication of anesthesia    VERY HARD TO WAKE UP    . Hypertension   . Personal history of radiation therapy   . PONV (postoperative nausea and vomiting)   . Smoker     Past Surgical History:  Procedure Laterality Date  . ABDOMINAL HYSTERECTOMY    . BREAST BIOPSY Right 01/02/2016  . BREAST EXCISIONAL BIOPSY Right 1979  . BREAST LUMPECTOMY Right 01/27/2016  . BREAST LUMPECTOMY WITH RADIOACTIVE SEED AND SENTINEL LYMPH NODE BIOPSY Right 01/27/2016   Procedure: BREAST LUMPECTOMY WITH RADIOACTIVE SEED AND SENTINEL LYMPH NODE BIOPSY;  Surgeon: Excell Seltzer, MD;  Location: [place];  Service: General;  Laterality: Right;  . BREAST RECONSTRUCTION WITH PLACEMENT OF TISSUE EXPANDER AND FLEX HD (ACELLULAR HYDRATED DERMIS) Bilateral 03/16/2017   Procedure: BILATERAL IMMEDIATE BREAST RECONSTRUCTION WITH PLACEMENT OF TISSUE EXPANDER AND FLEX HD (ACELLULAR HYDRATED DERMIS);  Surgeon: Wallace Going, DO;  Location: [place];  Service: Plastics;  Laterality: Bilateral;  . BREAST SURGERY  1979   cyst removed rt  breast  . CATARACT EXTRACTION W/ INTRAOCULAR LENS IMPLANT     RT EYE  . CESAREAN SECTION     x2  . MASTECTOMY W/ SENTINEL NODE BIOPSY Bilateral 03/16/2017   Procedure: BILATERAL TOTAL MASTECTOMY WITH LEFT SENTINEL LYMPH NODE BIOPSY;  Surgeon: Excell Seltzer, MD;  Location: [place];  Service: General;  Laterality: Bilateral;  . REMOVAL OF BILATERAL TISSUE EXPANDERS WITH PLACEMENT OF BILATERAL BREAST IMPLANTS Bilateral 06/15/2017   Procedure: REMOVAL OF BILATERAL TISSUE EXPANDERS WITH PLACEMENT OF BILATERAL BREAST IMPLANTS;  Surgeon: Wallace Going, DO;  Location: [place];  Service: Plastics;  Laterality: Bilateral;  . TOTAL MASTECTOMY Bilateral 03/16/2017   SENTINAL NODE BIOPSY    There were no vitals filed for this visit.  Subjective Assessment - 09/28/17 1348    Subjective  My surgery's next Wednesday and I'm having them (implants) both removed. Got the second opinion at [facility] and that helped her make the decision. Saw Dr. Marla Roe yesterday and she said after surgery that I'd probably have more physical therapy. The doctor at [facility], when I said I was doing physical therapy, suggested I avoid stretching and resistance.    Pertinent History  asthma, 2017 R breast cancer s/p lumpectomy, SLNB and radiation, 2018 L breast cancer s/p bilateral mastectomy and L SLNB bilateral reconstruction 03/16/2017.  Had exchange surgery with permanent implants 06/16/17.  Currently in Pain?  Yes    Pain Score  4     Pain Location  Breast    Pain Orientation  Right;Left    Pain Descriptors / Indicators  Discomfort    Pain Type  Chronic pain    Aggravating Factors   nothing in particular    Pain Relieving Factors  nothing in particular         [facility] PT Assessment - 09/28/17 0001      AROM   Left Shoulder Flexion  130 Degrees before stretching    Left Shoulder ABduction  157 Degrees moves toward scaption                  [facility] Adult PT Treatment/Exercise - 09/28/17  0001      Self-Care   Self-Care  Other Self-Care Comments    Other Self-Care Comments   Briefly reviewed lymphedema risk reduction practices.      Manual Therapy   Other Manual Therapy  In sitting with pillow supporting arms, soft tissue work with Biotone mixed with Biofeeze  to neck, upper to mid back, and extension out to lateral scapular areas. Focus on trigger point areas.             PT Education - 09/28/17 1737    Education provided  Yes    Education Details  lymphedema risk reduction practices; suggested that Meeks decompression exercises would be good to do following surgery, to gently work on avoiding rounded shoulders, which has been such a problem for her    Person(s) Educated  Patient    Methods  Explanation    Comprehension  Verbalized understanding             [place] - 09/28/17 1352      CC Long Term Goal  #1   Title  Pt will be able to independently verbalize lymphedema risk reduction practices    Status  Achieved      CC Long Term Goal  #2   Title  Pt will demonstrate 150 degrees of bilateral shoulder flexion to allow pt to reach items on top shelf and reach up to wash her hair    Status  Partially Met      CC Long Term Goal  #3   Title  Pt to demonstrate 150 degrees of bilateral shoulder abduction to allow pt to reach out to sides    Status  Achieved      CC Long Term Goal  #4   Title  Pt to be independent in a home exercise program for continued strengthening and stretching.     Status  Deferred      CC Long Term Goal  #5   Title  Pt. will report at least 50% decreased discomfort in right axilla/flank area    Baseline  no change as of 06/06/17; 75% as of 09/28/17    Status  Achieved         Plan - 09/28/17 1739    Clinical Impression Statement  Pt. was convinced by seeing a surgeon at [facility] for a second opinion that she did not want to go through lat flap reconstruction for her right side, and that the best option for her is  to have expanders removed and not replaced.  She will have this surgery a week from today.  Dr. Marla Roe told her that she will probably need to return to PT after that.    Rehab Potential  Good  Clinical Impairments Affecting Rehab Potential  bilateral breast cancer, hx of radiation; implant exchange surgery 06/16/17    PT Frequency  2x / week    PT Duration  4 weeks    PT Treatment/Interventions  ADLs/Self Care Home Management;Therapeutic exercise;Patient/family education;Manual techniques;Manual lymph drainage;Scar mobilization;Passive range of motion;Taping    PT Next Visit Plan  Discharge for now.  After her plastic surgery next week, she would need a new referral for PT.    Consulted and Agree with Plan of Care  Patient       Patient will benefit from skilled therapeutic intervention in order to improve the following deficits and impairments:  Increased fascial restricitons, Pain, Postural dysfunction, Decreased scar mobility, Decreased range of motion, Decreased strength, Impaired UE functional use, Increased edema, Decreased knowledge of precautions  Visit Diagnosis: Stiffness of right shoulder, not elsewhere classified  Stiffness of left shoulder, not elsewhere classified  Abnormal posture  Acute pain of right shoulder  Disorder of the skin and subcutaneous tissue related to radiation, unspecified     Problem List Patient Active Problem List   Diagnosis Date Noted  . Breast cancer ([place]) 03/16/2017  . Malignant neoplasm of upper-inner quadrant of left breast in female, estrogen receptor positive ([place]) 01/03/2017  . HTN (hypertension) 06/14/2016  . Malignant neoplasm of lower-inner quadrant of right breast of female, estrogen receptor positive ([place]) 01/07/2016  . Atherosclerosis of aorta ([place])   . Smoker     Mc Bloodworth 09/28/2017, 5:42 PM  [place], [place], [address] Phone:  [phone]   Fax:  [phone]  Name: Krystal Brooks MRN: [id number] Date of Birth: 08-Aug-1956  PHYSICAL THERAPY DISCHARGE SUMMARY  Visits from Start of Care: 25  Current functional level related to goals / functional outcomes: Goals partially met as noted above.   Remaining deficits: Still with discomfort and limited ROM.   Education / Equipment: Suggest gentle Meeks decompression exercises to work on decreasing rounded shoulder posture.  Plan: Patient agrees to discharge.  Patient goals were partially met. Patient is being discharged due to a change in medical status.  That is, she will have plastic surgery next week.?????   If she needs therapy following her surgery on 10/05/17, she will need a new referral.  Serafina Royals, PT 09/28/17 5:44 PM

## 2017-09-29 ENCOUNTER — Ambulatory Visit: Payer: Self-pay | Admitting: Plastic Surgery

## 2017-09-29 ENCOUNTER — Encounter (HOSPITAL_BASED_OUTPATIENT_CLINIC_OR_DEPARTMENT_OTHER)
Admission: RE | Admit: 2017-09-29 | Discharge: 2017-09-29 | Disposition: A | Discharge status: Home / Self Care | Payer: 59 | Source: Ambulatory Visit | Attending: Plastic Surgery | Admitting: Plastic Surgery

## 2017-09-29 DIAGNOSIS — Z9010 Acquired absence of unspecified breast and nipple: Secondary | ICD-10-CM

## 2017-09-29 DIAGNOSIS — Z853 Personal history of malignant neoplasm of breast: Secondary | ICD-10-CM

## 2017-09-29 DIAGNOSIS — Z01812 Encounter for preprocedural laboratory examination: Secondary | ICD-10-CM | POA: Insufficient documentation

## 2017-09-29 LAB — BASIC METABOLIC PANEL
ANION GAP: 13 (ref 5–15)
BUN: 12 mg/dL (ref 6–20)
CALCIUM: 9 mg/dL (ref 8.9–10.3)
CO2: 24 mmol/L (ref 22–32)
Chloride: 99 mmol/L — ABNORMAL LOW (ref 101–111)
Creatinine, Ser: 0.88 mg/dL (ref 0.44–1.00)
Glucose, Bld: 100 mg/dL — ABNORMAL HIGH (ref 65–99)
Potassium: 3.9 mmol/L (ref 3.5–5.1)
SODIUM: 136 mmol/L (ref 135–145)

## 2017-09-30 ENCOUNTER — Inpatient Hospital Stay (HOSPITAL_COMMUNITY): Admission: RE | Admit: 2017-09-30 | Payer: 59 | Source: Ambulatory Visit

## 2017-10-05 ENCOUNTER — Encounter (HOSPITAL_BASED_OUTPATIENT_CLINIC_OR_DEPARTMENT_OTHER)
Admission: RE | Disposition: A | Discharge status: Home / Self Care | Payer: Self-pay | Source: Ambulatory Visit | Attending: Plastic Surgery

## 2017-10-05 ENCOUNTER — Encounter (HOSPITAL_BASED_OUTPATIENT_CLINIC_OR_DEPARTMENT_OTHER): Payer: Self-pay

## 2017-10-05 ENCOUNTER — Ambulatory Visit (HOSPITAL_BASED_OUTPATIENT_CLINIC_OR_DEPARTMENT_OTHER): Payer: 59 | Admitting: Anesthesiology

## 2017-10-05 ENCOUNTER — Ambulatory Visit (HOSPITAL_BASED_OUTPATIENT_CLINIC_OR_DEPARTMENT_OTHER)
Admission: RE | Admit: 2017-10-05 | Discharge: 2017-10-05 | Disposition: A | Discharge status: Home / Self Care | Payer: 59 | Source: Ambulatory Visit | Attending: Plastic Surgery | Admitting: Plastic Surgery

## 2017-10-05 ENCOUNTER — Other Ambulatory Visit: Payer: Self-pay

## 2017-10-05 DIAGNOSIS — Z7951 Long term (current) use of inhaled steroids: Secondary | ICD-10-CM | POA: Insufficient documentation

## 2017-10-05 DIAGNOSIS — I10 Essential (primary) hypertension: Secondary | ICD-10-CM | POA: Insufficient documentation

## 2017-10-05 DIAGNOSIS — Z9013 Acquired absence of bilateral breasts and nipples: Secondary | ICD-10-CM | POA: Diagnosis not present

## 2017-10-05 DIAGNOSIS — F419 Anxiety disorder, unspecified: Secondary | ICD-10-CM | POA: Diagnosis not present

## 2017-10-05 DIAGNOSIS — Z882 Allergy status to sulfonamides status: Secondary | ICD-10-CM | POA: Diagnosis not present

## 2017-10-05 DIAGNOSIS — Z923 Personal history of irradiation: Secondary | ICD-10-CM | POA: Diagnosis not present

## 2017-10-05 DIAGNOSIS — X58XXXA Exposure to other specified factors, initial encounter: Secondary | ICD-10-CM | POA: Insufficient documentation

## 2017-10-05 DIAGNOSIS — Z8249 Family history of ischemic heart disease and other diseases of the circulatory system: Secondary | ICD-10-CM | POA: Diagnosis not present

## 2017-10-05 DIAGNOSIS — Z880 Allergy status to penicillin: Secondary | ICD-10-CM | POA: Insufficient documentation

## 2017-10-05 DIAGNOSIS — I739 Peripheral vascular disease, unspecified: Secondary | ICD-10-CM | POA: Insufficient documentation

## 2017-10-05 DIAGNOSIS — Z90710 Acquired absence of both cervix and uterus: Secondary | ICD-10-CM | POA: Insufficient documentation

## 2017-10-05 DIAGNOSIS — Z809 Family history of malignant neoplasm, unspecified: Secondary | ICD-10-CM | POA: Diagnosis not present

## 2017-10-05 DIAGNOSIS — J45909 Unspecified asthma, uncomplicated: Secondary | ICD-10-CM | POA: Diagnosis not present

## 2017-10-05 DIAGNOSIS — Z825 Family history of asthma and other chronic lower respiratory diseases: Secondary | ICD-10-CM | POA: Insufficient documentation

## 2017-10-05 DIAGNOSIS — I700 Atherosclerosis of aorta: Secondary | ICD-10-CM | POA: Diagnosis not present

## 2017-10-05 DIAGNOSIS — Z800 Family history of malignant neoplasm of digestive organs: Secondary | ICD-10-CM | POA: Diagnosis not present

## 2017-10-05 DIAGNOSIS — L923 Foreign body granuloma of the skin and subcutaneous tissue: Secondary | ICD-10-CM | POA: Diagnosis not present

## 2017-10-05 DIAGNOSIS — Z87891 Personal history of nicotine dependence: Secondary | ICD-10-CM | POA: Insufficient documentation

## 2017-10-05 DIAGNOSIS — Z853 Personal history of malignant neoplasm of breast: Secondary | ICD-10-CM | POA: Diagnosis not present

## 2017-10-05 DIAGNOSIS — Z833 Family history of diabetes mellitus: Secondary | ICD-10-CM | POA: Diagnosis not present

## 2017-10-05 DIAGNOSIS — T8549XA Other mechanical complication of breast prosthesis and implant, initial encounter: Secondary | ICD-10-CM | POA: Insufficient documentation

## 2017-10-05 DIAGNOSIS — Z9882 Breast implant status: Secondary | ICD-10-CM | POA: Diagnosis present

## 2017-10-05 DIAGNOSIS — T8544XA Capsular contracture of breast implant, initial encounter: Secondary | ICD-10-CM | POA: Diagnosis not present

## 2017-10-05 DIAGNOSIS — Z9841 Cataract extraction status, right eye: Secondary | ICD-10-CM | POA: Insufficient documentation

## 2017-10-05 DIAGNOSIS — T85848A Pain due to other internal prosthetic devices, implants and grafts, initial encounter: Secondary | ICD-10-CM | POA: Insufficient documentation

## 2017-10-05 DIAGNOSIS — Z820 Family history of epilepsy and other diseases of the nervous system: Secondary | ICD-10-CM | POA: Insufficient documentation

## 2017-10-05 DIAGNOSIS — Z9010 Acquired absence of unspecified breast and nipple: Secondary | ICD-10-CM

## 2017-10-05 DIAGNOSIS — H57052 Tonic pupil, left eye: Secondary | ICD-10-CM | POA: Insufficient documentation

## 2017-10-05 DIAGNOSIS — Z79899 Other long term (current) drug therapy: Secondary | ICD-10-CM | POA: Diagnosis not present

## 2017-10-05 HISTORY — PX: BREAST IMPLANT REMOVAL: SHX5361

## 2017-10-05 SURGERY — REMOVAL, IMPLANT, BREAST
Anesthesia: General | Site: Breast | Laterality: Bilateral

## 2017-10-05 MED ORDER — LIDOCAINE-EPINEPHRINE 1 %-1:100000 IJ SOLN
INTRAMUSCULAR | Status: AC
  Filled 2017-10-05: qty 1

## 2017-10-05 MED ORDER — PROPOFOL 10 MG/ML IV BOLUS
INTRAVENOUS | Status: DC | PRN
  Administered 2017-10-05: 150 mg via INTRAVENOUS

## 2017-10-05 MED ORDER — BUPIVACAINE-EPINEPHRINE (PF) 0.25% -1:200000 IJ SOLN
INTRAMUSCULAR | Status: AC
  Filled 2017-10-05: qty 90

## 2017-10-05 MED ORDER — DEXAMETHASONE SODIUM PHOSPHATE 4 MG/ML IJ SOLN
INTRAMUSCULAR | Status: DC | PRN
  Administered 2017-10-05: 10 mg via INTRAVENOUS

## 2017-10-05 MED ORDER — SODIUM CHLORIDE 0.9% FLUSH: 3.0000 mL | Freq: Two times a day (BID) | INTRAVENOUS | Status: DC

## 2017-10-05 MED ORDER — ONDANSETRON HCL 4 MG/2ML IJ SOLN
INTRAMUSCULAR | Status: DC | PRN
  Administered 2017-10-05: 4 mg via INTRAVENOUS

## 2017-10-05 MED ORDER — FENTANYL CITRATE (PF) 100 MCG/2ML IJ SOLN
50.0000 ug | INTRAMUSCULAR | Status: DC | PRN
  Administered 2017-10-05 (×2): 50 ug via INTRAVENOUS

## 2017-10-05 MED ORDER — PROMETHAZINE HCL 25 MG/ML IJ SOLN: 6.2500 mg | INTRAMUSCULAR | Status: DC | PRN

## 2017-10-05 MED ORDER — FENTANYL CITRATE (PF) 100 MCG/2ML IJ SOLN
INTRAMUSCULAR | Status: AC
  Filled 2017-10-05: qty 2

## 2017-10-05 MED ORDER — OXYCODONE HCL 5 MG/5ML PO SOLN: 5.0000 mg | Freq: Once | ORAL | Status: DC | PRN

## 2017-10-05 MED ORDER — ALBUTEROL SULFATE (2.5 MG/3ML) 0.083% IN NEBU
2.5000 mg | INHALATION_SOLUTION | Freq: Four times a day (QID) | RESPIRATORY_TRACT | Status: DC | PRN
  Administered 2017-10-05: 2.5 mg via RESPIRATORY_TRACT

## 2017-10-05 MED ORDER — SCOPOLAMINE 1 MG/3DAYS TD PT72
1.0000 | MEDICATED_PATCH | Freq: Once | TRANSDERMAL | Status: DC | PRN
  Administered 2017-10-05: 1.5 mg via TRANSDERMAL

## 2017-10-05 MED ORDER — HYDROMORPHONE HCL 1 MG/ML IJ SOLN
INTRAMUSCULAR | Status: AC
  Filled 2017-10-05: qty 0.5

## 2017-10-05 MED ORDER — LACTATED RINGERS IV SOLN
INTRAVENOUS | Status: DC
  Administered 2017-10-05 (×2): via INTRAVENOUS

## 2017-10-05 MED ORDER — DEXAMETHASONE SODIUM PHOSPHATE 10 MG/ML IJ SOLN
INTRAMUSCULAR | Status: AC
  Filled 2017-10-05: qty 1

## 2017-10-05 MED ORDER — PHENYLEPHRINE 40 MCG/ML (10ML) SYRINGE FOR IV PUSH (FOR BLOOD PRESSURE SUPPORT)
PREFILLED_SYRINGE | INTRAVENOUS | Status: AC
  Filled 2017-10-05: qty 10

## 2017-10-05 MED ORDER — PHENYLEPHRINE HCL 10 MG/ML IJ SOLN
INTRAMUSCULAR | Status: DC | PRN
  Administered 2017-10-05: 40 ug via INTRAVENOUS

## 2017-10-05 MED ORDER — BACITRACIN ZINC 500 UNIT/GM EX OINT
TOPICAL_OINTMENT | CUTANEOUS | Status: AC
  Filled 2017-10-05: qty 28.35

## 2017-10-05 MED ORDER — MIDAZOLAM HCL 2 MG/2ML IJ SOLN
1.0000 mg | INTRAMUSCULAR | Status: DC | PRN
  Administered 2017-10-05 (×2): 1 mg via INTRAVENOUS

## 2017-10-05 MED ORDER — EPHEDRINE SULFATE 50 MG/ML IJ SOLN
INTRAMUSCULAR | Status: DC | PRN
  Administered 2017-10-05: 10 mg via INTRAVENOUS

## 2017-10-05 MED ORDER — SUCCINYLCHOLINE CHLORIDE 200 MG/10ML IV SOSY
PREFILLED_SYRINGE | INTRAVENOUS | Status: AC
  Filled 2017-10-05: qty 10

## 2017-10-05 MED ORDER — ALBUTEROL SULFATE (2.5 MG/3ML) 0.083% IN NEBU
INHALATION_SOLUTION | RESPIRATORY_TRACT | Status: AC
  Filled 2017-10-05: qty 3

## 2017-10-05 MED ORDER — HYDROMORPHONE HCL 1 MG/ML IJ SOLN
0.2500 mg | INTRAMUSCULAR | Status: DC | PRN
  Administered 2017-10-05: 0.5 mg via INTRAVENOUS

## 2017-10-05 MED ORDER — OXYCODONE HCL 5 MG PO TABS: 5.0000 mg | ORAL_TABLET | ORAL | Status: DC | PRN

## 2017-10-05 MED ORDER — OXYCODONE HCL 5 MG PO TABS: 5.0000 mg | ORAL_TABLET | Freq: Once | ORAL | Status: DC | PRN

## 2017-10-05 MED ORDER — EPHEDRINE 5 MG/ML INJ
INTRAVENOUS | Status: AC
  Filled 2017-10-05: qty 10

## 2017-10-05 MED ORDER — SODIUM CHLORIDE 0.9% FLUSH: 3.0000 mL | INTRAVENOUS | Status: DC | PRN

## 2017-10-05 MED ORDER — PHENYLEPHRINE HCL 10 MG/ML IJ SOLN: INTRAVENOUS | Status: DC | PRN

## 2017-10-05 MED ORDER — MIDAZOLAM HCL 2 MG/2ML IJ SOLN
INTRAMUSCULAR | Status: AC
  Filled 2017-10-05: qty 2

## 2017-10-05 MED ORDER — CIPROFLOXACIN IN D5W 400 MG/200ML IV SOLN
400.0000 mg | INTRAVENOUS | Status: AC
  Administered 2017-10-05 (×2): 400 mg via INTRAVENOUS

## 2017-10-05 MED ORDER — ONDANSETRON HCL 4 MG/2ML IJ SOLN
INTRAMUSCULAR | Status: AC
  Filled 2017-10-05: qty 2

## 2017-10-05 MED ORDER — CIPROFLOXACIN IN D5W 400 MG/200ML IV SOLN
INTRAVENOUS | Status: AC
  Filled 2017-10-05: qty 200

## 2017-10-05 MED ORDER — SCOPOLAMINE 1 MG/3DAYS TD PT72
MEDICATED_PATCH | TRANSDERMAL | Status: AC
  Filled 2017-10-05: qty 1

## 2017-10-05 MED ORDER — MEPERIDINE HCL 25 MG/ML IJ SOLN: 6.2500 mg | INTRAMUSCULAR | Status: DC | PRN

## 2017-10-05 MED ORDER — LIDOCAINE 2% (20 MG/ML) 5 ML SYRINGE
INTRAMUSCULAR | Status: AC
  Filled 2017-10-05: qty 5

## 2017-10-05 MED ORDER — SODIUM CHLORIDE 0.9 % IV SOLN
INTRAVENOUS | Status: DC | PRN
  Administered 2017-10-05: 500 mL

## 2017-10-05 MED ORDER — SODIUM CHLORIDE 0.9 % IV SOLN: 250.0000 mL | INTRAVENOUS | Status: DC | PRN

## 2017-10-05 MED ORDER — LIDOCAINE-EPINEPHRINE 1 %-1:100000 IJ SOLN
INTRAMUSCULAR | Status: DC | PRN
  Administered 2017-10-05: 2 mL

## 2017-10-05 MED ORDER — LIDOCAINE HCL (CARDIAC) 20 MG/ML IV SOLN
INTRAVENOUS | Status: DC | PRN
  Administered 2017-10-05: 100 mg via INTRAVENOUS

## 2017-10-05 SURGICAL SUPPLY — 68 items
ADH SKN CLS APL DERMABOND .7 (GAUZE/BANDAGES/DRESSINGS) ×1
BAG DECANTER FOR FLEXI CONT (MISCELLANEOUS) ×2 IMPLANT
BINDER BREAST LRG (GAUZE/BANDAGES/DRESSINGS) IMPLANT
BINDER BREAST MEDIUM (GAUZE/BANDAGES/DRESSINGS) ×1 IMPLANT
BINDER BREAST XLRG (GAUZE/BANDAGES/DRESSINGS) IMPLANT
BINDER BREAST XXLRG (GAUZE/BANDAGES/DRESSINGS) IMPLANT
BIOPATCH RED 1 DISK 7.0 (GAUZE/BANDAGES/DRESSINGS) ×2 IMPLANT
BLADE HEX COATED 2.75 (ELECTRODE) ×2 IMPLANT
BLADE SURG 15 STRL LF DISP TIS (BLADE) ×1 IMPLANT
BLADE SURG 15 STRL SS (BLADE) ×2
BNDG GAUZE ELAST 4 BULKY (GAUZE/BANDAGES/DRESSINGS) ×2 IMPLANT
CANISTER SUCT 1200ML W/VALVE (MISCELLANEOUS) ×2 IMPLANT
CHLORAPREP W/TINT 26ML (MISCELLANEOUS) ×2 IMPLANT
COVER BACK TABLE 60X90IN (DRAPES) ×2 IMPLANT
COVER MAYO STAND STRL (DRAPES) ×2 IMPLANT
DECANTER SPIKE VIAL GLASS SM (MISCELLANEOUS) IMPLANT
DERMABOND ADVANCED (GAUZE/BANDAGES/DRESSINGS) ×1
DERMABOND ADVANCED .7 DNX12 (GAUZE/BANDAGES/DRESSINGS) IMPLANT
DRAIN CHANNEL 19F RND (DRAIN) ×2 IMPLANT
DRAPE LAPAROSCOPIC ABDOMINAL (DRAPES) ×2 IMPLANT
DRSG PAD ABDOMINAL 8X10 ST (GAUZE/BANDAGES/DRESSINGS) ×3 IMPLANT
ELECT BLADE 4.0 EZ CLEAN MEGAD (MISCELLANEOUS) ×2
ELECT BLADE 6.5 .24CM SHAFT (ELECTRODE) IMPLANT
ELECT REM PT RETURN 9FT ADLT (ELECTROSURGICAL) ×2
ELECTRODE BLDE 4.0 EZ CLN MEGD (MISCELLANEOUS) IMPLANT
ELECTRODE REM PT RTRN 9FT ADLT (ELECTROSURGICAL) ×1 IMPLANT
EVACUATOR SILICONE 100CC (DRAIN) ×2 IMPLANT
GAUZE SPONGE 4X4 12PLY STRL (GAUZE/BANDAGES/DRESSINGS) ×1 IMPLANT
GLOVE BIO SURGEON STRL SZ 6.5 (GLOVE) ×3 IMPLANT
GLOVE BIO SURGEON STRL SZ7 (GLOVE) ×1 IMPLANT
GOWN STRL REUS W/ TWL LRG LVL3 (GOWN DISPOSABLE) ×2 IMPLANT
GOWN STRL REUS W/ TWL XL LVL3 (GOWN DISPOSABLE) IMPLANT
GOWN STRL REUS W/TWL LRG LVL3 (GOWN DISPOSABLE) ×4
GOWN STRL REUS W/TWL XL LVL3 (GOWN DISPOSABLE) ×2
IV NS 1000ML (IV SOLUTION)
IV NS 1000ML BAXH (IV SOLUTION) IMPLANT
IV NS 500ML (IV SOLUTION)
IV NS 500ML BAXH (IV SOLUTION) IMPLANT
KIT FILL SYSTEM UNIVERSAL (SET/KITS/TRAYS/PACK) IMPLANT
NDL HYPO 25X1 1.5 SAFETY (NEEDLE) IMPLANT
NDL SAFETY ECLIPSE 18X1.5 (NEEDLE) IMPLANT
NEEDLE HYPO 18GX1.5 SHARP (NEEDLE)
NEEDLE HYPO 25X1 1.5 SAFETY (NEEDLE) ×2 IMPLANT
NS IRRIG 1000ML POUR BTL (IV SOLUTION) IMPLANT
PACK BASIN DAY SURGERY FS (CUSTOM PROCEDURE TRAY) ×2 IMPLANT
PENCIL BUTTON HOLSTER BLD 10FT (ELECTRODE) ×2 IMPLANT
PIN SAFETY STERILE (MISCELLANEOUS) ×1 IMPLANT
SLEEVE SCD COMPRESS KNEE MED (MISCELLANEOUS) ×2 IMPLANT
SPONGE LAP 18X18 X RAY DECT (DISPOSABLE) ×4 IMPLANT
SUT MNCRL AB 4-0 PS2 18 (SUTURE) ×2 IMPLANT
SUT MON AB 3-0 SH 27 (SUTURE) ×4
SUT MON AB 3-0 SH27 (SUTURE) ×1 IMPLANT
SUT MON AB 5-0 PS2 18 (SUTURE) ×3 IMPLANT
SUT PDS AB 2-0 CT2 27 (SUTURE) IMPLANT
SUT PROLENE 3 0 PS 2 (SUTURE) IMPLANT
SUT SILK 3 0 PS 1 (SUTURE) ×2 IMPLANT
SUT VIC AB 3-0 SH 27 (SUTURE)
SUT VIC AB 3-0 SH 27X BRD (SUTURE) IMPLANT
SUT VICRYL 4-0 PS2 18IN ABS (SUTURE) IMPLANT
SWAB COLLECTION DEVICE MRSA (MISCELLANEOUS) IMPLANT
SWAB CULTURE ESWAB REG 1ML (MISCELLANEOUS) IMPLANT
SYR 50ML LL SCALE MARK (SYRINGE) IMPLANT
SYR BULB IRRIGATION 50ML (SYRINGE) ×2 IMPLANT
SYR CONTROL 10ML LL (SYRINGE) ×1 IMPLANT
TOWEL OR 17X24 6PK STRL BLUE (TOWEL DISPOSABLE) ×4 IMPLANT
TUBE CONNECTING 20X1/4 (TUBING) ×2 IMPLANT
UNDERPAD 30X30 (UNDERPADS AND DIAPERS) ×4 IMPLANT
YANKAUER SUCT BULB TIP NO VENT (SUCTIONS) ×2 IMPLANT

## 2017-10-05 NOTE — Transfer of Care (Signed)
Immediate Anesthesia Transfer of Care Note  Patient: Krystal Brooks  Procedure(s) Performed: REMOVALOF BILATERAL BREAST IMPLANTS (Bilateral Breast)  Patient Location: PACU  Anesthesia Type:General  Level of Consciousness: sedated  Airway & Oxygen Therapy: Patient Spontanous Breathing and Patient connected to face mask oxygen  Post-op Assessment: Report given to RN and Post -op Vital signs reviewed and stable  Post vital signs: Reviewed and stable  Last Vitals:  Vitals:   10/05/17 0735  BP: (!) 125/58  Pulse: 78  Resp: 18  Temp: 37 C  SpO2: 100%    Last Pain:  Vitals:   10/05/17 0735  TempSrc: Oral         Complications: No apparent anesthesia complications

## 2017-10-05 NOTE — Discharge Instructions (Signed)
Drain care No heavy lifting Sink bath Continue binder or sports bra   Post Anesthesia Home Care Instructions  Activity: Get plenty of rest for the remainder of the day. A responsible individual must stay with you for 24 hours following the procedure.  For the next 24 hours, DO NOT: -Drive a car -Paediatric nurse -Drink alcoholic beverages -Take any medication unless instructed by your physician -Make any legal decisions or sign important papers.  Meals: Start with liquid foods such as gelatin or soup. Progress to regular foods as tolerated. Avoid greasy, spicy, heavy foods. If nausea and/or vomiting occur, drink only clear liquids until the nausea and/or vomiting subsides. Call your physician if vomiting continues.  Special Instructions/Symptoms: Your throat may feel dry or sore from the anesthesia or the breathing tube placed in your throat during surgery. If this causes discomfort, gargle with warm salt water. The discomfort should disappear within 24 hours.  If you had a scopolamine patch placed behind your ear for the management of post- operative nausea and/or vomiting:  1. The medication in the patch is effective for 72 hours, after which it should be removed.  Wrap patch in a tissue and discard in the trash. Wash hands thoroughly with soap and water. 2. You may remove the patch earlier than 72 hours if you experience unpleasant side effects which may include dry mouth, dizziness or visual disturbances. 3. Avoid touching the patch. Wash your hands with soap and water after contact with the patch.   About my Jackson-Pratt Bulb Drain  What is a Jackson-Pratt bulb? A Jackson-Pratt is a soft, round device used to collect drainage. It is connected to a long, thin drainage catheter, which is held in place by one or two small stiches near your surgical incision site. When the bulb is squeezed, it forms a vacuum, forcing the drainage to empty into the bulb.  Emptying the  Jackson-Pratt bulb- To empty the bulb: 1. Release the plug on the top of the bulb. 2. Pour the bulb's contents into a measuring container which your nurse will provide. 3. Record the time emptied and amount of drainage. Empty the drain(s) as often as your     doctor or nurse recommends.  Date                  Time                    Amount (Drain 1)                 Amount (Drain 2)  _____________________________________________________________________  _____________________________________________________________________  _____________________________________________________________________  _____________________________________________________________________  _____________________________________________________________________  _____________________________________________________________________  _____________________________________________________________________  _____________________________________________________________________  Squeezing the Jackson-Pratt Bulb- To squeeze the bulb: 1. Make sure the plug at the top of the bulb is open. 2. Squeeze the bulb tightly in your fist. You will hear air squeezing from the bulb. 3. Replace the plug while the bulb is squeezed. 4. Use a safety pin to attach the bulb to your clothing. This will keep the catheter from     pulling at the bulb insertion site.  When to call your doctor- Call your doctor if:  Drain site becomes red, swollen or hot.  You have a fever greater than 101 degrees F.  There is oozing at the drain site.  Drain falls out (apply a guaze bandage over the drain hole and secure it with tape).  Drainage increases daily not related to activity patterns. (You will usually have more drainage  when you are active than when you are resting.)  Drainage has a bad odor.

## 2017-10-05 NOTE — H&P (Signed)
Krystal Brooks is an 62 y.o. female.   Chief Complaint: acquired absence HPI: The patient is a 61 yo female who is here for post operative follow up following exchange surgery with removal of bilateral tissue expanders and placement of bilateral silicone implants on 85/46/27. She underwent bilateral breast reconstruction with tissue expanders and dermal matrix placement following bilateral mastectomies 03/16/17.  Implants: Mentor SmoothModerate PlusProfile Xtra 325 cc bilaterally She had right sided radiation previously and has now developed bruising and thinning over the right lateral implant concerning for implant exposure. She was thinking of having a latissimus flap on the right side, but is now thinking that she would just like both implants removed.    Past Medical History:  Diagnosis Date  . Adie's pupil    LEFT EYE  . Allergy   . Anxiety   . Asthma   . Atherosclerosis of aorta (HCC)    bulge above renal arteries on mri 2016  . Cancer [facility]) 2017   right breast ca  . Complication of anesthesia    VERY HARD TO WAKE UP    . Hypertension   . Personal history of radiation therapy   . PONV (postoperative nausea and vomiting)   . Smoker     Past Surgical History:  Procedure Laterality Date  . ABDOMINAL HYSTERECTOMY    . BREAST BIOPSY Right 01/02/2016  . BREAST EXCISIONAL BIOPSY Right 1979  . BREAST LUMPECTOMY Right 01/27/2016  . BREAST LUMPECTOMY WITH RADIOACTIVE SEED AND SENTINEL LYMPH NODE BIOPSY Right 01/27/2016   Procedure: BREAST LUMPECTOMY WITH RADIOACTIVE SEED AND SENTINEL LYMPH NODE BIOPSY;  Surgeon: Excell Seltzer, MD;  Location: [place];  Service: General;  Laterality: Right;  . BREAST RECONSTRUCTION WITH PLACEMENT OF TISSUE EXPANDER AND FLEX HD (ACELLULAR HYDRATED DERMIS) Bilateral 03/16/2017   Procedure: BILATERAL IMMEDIATE BREAST RECONSTRUCTION WITH PLACEMENT OF TISSUE EXPANDER AND FLEX HD (ACELLULAR HYDRATED DERMIS);  Surgeon: Wallace Going,  DO;  Location: [place];  Service: Plastics;  Laterality: Bilateral;  . BREAST SURGERY  1979   cyst removed rt breast  . CATARACT EXTRACTION W/ INTRAOCULAR LENS IMPLANT     RT EYE  . CESAREAN SECTION     x2  . MASTECTOMY W/ SENTINEL NODE BIOPSY Bilateral 03/16/2017   Procedure: BILATERAL TOTAL MASTECTOMY WITH LEFT SENTINEL LYMPH NODE BIOPSY;  Surgeon: Excell Seltzer, MD;  Location: [place];  Service: General;  Laterality: Bilateral;  . REMOVAL OF BILATERAL TISSUE EXPANDERS WITH PLACEMENT OF BILATERAL BREAST IMPLANTS Bilateral 06/15/2017   Procedure: REMOVAL OF BILATERAL TISSUE EXPANDERS WITH PLACEMENT OF BILATERAL BREAST IMPLANTS;  Surgeon: Wallace Going, DO;  Location: [place];  Service: Plastics;  Laterality: Bilateral;  . TOTAL MASTECTOMY Bilateral 03/16/2017   SENTINAL NODE BIOPSY    Family History  Problem Relation Age of Onset  . Hearing loss Mother   . Asthma Father   . Cancer Father        melanoma, metastases   . Diabetes Father   . Heart disease Father   . Early death Sister   . Early death Paternal Grandfather   . Cancer Maternal Grandmother 22       colon cancer    Social History:  reports that she has quit smoking. Her smoking use included cigarettes. She has a 20.00 pack-year smoking history. she has never used smokeless tobacco. She reports that she drinks alcohol. She reports that she does not use drugs.  Allergies:  Allergies  Allergen Reactions  .  Penicillins Swelling, Rash and Other (See Comments)    Older sister died from reaction to penicillin Has patient had a PCN reaction causing immediate rash, facial/tongue/throat swelling, SOB or lightheadedness with hypotension: Yes Has patient had a PCN reaction causing severe rash involving mucus membranes or skin necrosis: No Has patient had a PCN reaction that required hospitalization: No Has patient had a PCN reaction occurring within the last 10 years: No If all of the above answers are  "NO", then may proceed with Cephalosporin use.   . Sulfa Antibiotics Rash    Medications Prior to Admission  Medication Sig Dispense Refill  . acetaminophen (TYLENOL) 500 MG tablet Take 1,500 mg by mouth daily as needed for moderate pain or headache.    . albuterol (VENTOLIN HFA) 108 (90 Base) MCG/ACT inhaler USE 1-2 PUFFS EVERY 6 HOURS AS NEEDED FOR WHEEZING 18 g 5  . ANORO ELLIPTA 62.5-25 MCG/INH AEPB INHALE 1 PUFF INTO THE LUNGS DAILY 60 each 10  . Ascorbic Acid (VITAMIN C) 1000 MG tablet Take 1,000 mg by mouth daily.    Marland Kitchen losartan (COZAAR) 100 MG tablet Take 1 tablet (100 mg total) by mouth daily. 90 tablet 3  . montelukast (SINGULAIR) 10 MG tablet TAKE 1 TABLET BY MOUTH EVERY DAY (Patient taking differently: TAKE 10 MG BY MOUTH EVERY DAY AT BEDTIME) 90 tablet 3  . Multiple Vitamin (MULTIVITAMIN WITH MINERALS) TABS tablet Take 1 tablet by mouth daily.    . tamoxifen (NOLVADEX) 20 MG tablet Take 20 mg by mouth daily.     Marland Kitchen triamterene-hydrochlorothiazide (MAXZIDE-25) 37.5-25 MG tablet Take 1 tablet by mouth daily. Stop amlodipine 90 tablet 3    No results found for this or any previous visit (from the past 48 hour(s)). No results found.  Review of Systems  Constitutional: Negative.   HENT: Negative.   Eyes: Negative.   Respiratory: Negative.   Cardiovascular: Negative.   Gastrointestinal: Negative.   Genitourinary: Negative.   Musculoskeletal: Negative.   Skin: Negative.   Psychiatric/Behavioral: Negative.     Blood pressure (!) 125/58, pulse 78, temperature 98.6 F (37 C), temperature source Oral, resp. rate 18, height 5' (1.524 m), weight 56.7 kg (125 lb), SpO2 100 %. Physical Exam  Constitutional: She is oriented to person, place, and time. She appears well-developed and well-nourished.  HENT:  Head: Normocephalic and atraumatic.  Eyes: EOM are normal. Pupils are equal, round, and reactive to light.  Cardiovascular: Normal rate.  Respiratory: Effort normal.  GI: Soft.   Neurological: She is alert and oriented to person, place, and time.  Skin: Skin is warm.  Psychiatric: She has a normal mood and affect. Her behavior is normal. Judgment and thought content normal.     Assessment/Plan Plan for removal of bilateral breast implants with revision of skin flaps.  [place], DO 10/05/2017, 8:12 AM

## 2017-10-05 NOTE — Op Note (Signed)
Op report    DATE OF OPERATION:  10/05/2017  LOCATION: [facility]  SURGICAL DIVISION: Plastic Surgery  PREOPERATIVE DIAGNOSES:  1. History of Breast cancer.   2. Acquired absence of bilateral breasts. 3. Capsule contracture of right breast implant.  POSTOPERATIVE DIAGNOSES:  1. History of Breast cancer.   2. Acquired absence of bilateral breasts. 3. Capsule contracture of right breast implant.  PROCEDURE:  1. Left breast implant removal. 2. Right breast implant removal. 3. Complete capsulectomy of right breast. 4. Complete capsulectomy of left breast with tissue removal for closure.  SURGEON: Claire Sanger Dillingham, DO  ANESTHESIA:  General.   DRAINS: 2 blake drains  COMPLICATIONS: None.   INDICATIONS FOR PROCEDURE:  The patient, Krystal Brooks, is a 62 y.o. female born on 1956-02-26, is here for breast reconstruction revision with removal of bilateral breast implants due to capsule contracture and pain. MRN: [id number]  CONSENT:  Informed consent was obtained directly from the patient. Risks, benefits and alternatives were fully discussed. Specific risks including but not limited to bleeding, infection, hematoma, seroma, scarring, pain, implant infection, implant extrusion, capsular contracture, asymmetry, wound healing problems, and need for further surgery were all discussed. The patient did have an ample opportunity to have her questions answered to her satisfaction.   DESCRIPTION OF PROCEDURE:  The patient was taken to the operating room by the general surgery team. SCDs were placed and IV antibiotics were given. The patient's chest was prepped and draped in a sterile fashion. A time out was performed.  Local was injected at the previous incision sites of the breast for intraoperative hemostasis and postoperative pain control.  RIGHT: The #10 blade was used to make the incision in the previous incision scar.  The pectoralis major muscle was released  from the flap to free the muscle.  All of the anterior capsule and FlexHD was excised.  The flap was released at the capsule contracture laterally.  The pocket was irrigated with antibiotic solution. Hemostasis was achieved with electrocautery.  A drain was placed and secured to the skin with the 3-0 Silk.  The deep layers were closed with 3-0 Monocryl followed by 4-0 Monocryl for the skin closure.      LEFT: The #10 blade was used to make an elliptical incision to remove the previous mastectomy scar as well as excess skin.  The pectoralis major muscle was released from the flap to free the muscle.  All of the anterior capsule and FlexHD was excised.  The flap was released at the capsule contracture laterally.  The pocket was irrigated with antibiotic solution. Hemostasis was achieved with electrocautery.  A drain was placed and secured to the skin with the 3-0 Silk.  The deep layers were closed with 3-0 Monocryl followed by 4-0 and 5-0 Monocryl for the skin closure.    Derma bond was applied to both incision sites.  The ABDs and breast binder were placed.  The patient tolerated the procedure well and there were no complications.  The patient was allowed to wake from anesthesia and taken to the recovery room in satisfactory condition. Specimens removed were sent to pathology.

## 2017-10-05 NOTE — Anesthesia Procedure Notes (Signed)
Procedure Name: LMA Insertion Date/Time: 10/05/2017 8:44 AM Performed by: Willa Frater, CRNA Pre-anesthesia Checklist: Patient identified, Emergency Drugs available, Suction available and Patient being monitored Patient Re-evaluated:Patient Re-evaluated prior to induction Oxygen Delivery Method: Circle system utilized Preoxygenation: Pre-oxygenation with 100% oxygen Induction Type: IV induction Ventilation: Mask ventilation without difficulty LMA: LMA inserted LMA Size: 4.0 Number of attempts: 1 Airway Equipment and Method: Bite block Placement Confirmation: positive ETCO2 Tube secured with: Tape Dental Injury: Teeth and Oropharynx as per pre-operative assessment

## 2017-10-05 NOTE — Anesthesia Postprocedure Evaluation (Signed)
Anesthesia Post Note  Patient: Krystal Brooks  Procedure(s) Performed: REMOVALOF BILATERAL BREAST IMPLANTS (Bilateral Breast)     Patient location during evaluation: PACU Anesthesia Type: General Level of consciousness: sedated and patient cooperative Pain management: pain level controlled Vital Signs Assessment: post-procedure vital signs reviewed and stable Respiratory status: spontaneous breathing Cardiovascular status: stable Anesthetic complications: no    Last Vitals:  Vitals:   10/05/17 1100 10/05/17 1131  BP: 116/67 110/60  Pulse: 88 90  Resp: 16 18  Temp:  36.7 C  SpO2: 95% 96%    Last Pain:  Vitals:   10/05/17 1131  TempSrc: Oral  PainSc: [place]

## 2017-10-05 NOTE — Anesthesia Preprocedure Evaluation (Signed)
Anesthesia Evaluation  Patient identified by MRN, date of birth, ID band Patient awake    Reviewed: Allergy & Precautions, NPO status , Patient's Chart, lab work & pertinent test results  History of Anesthesia Complications (+) PONV and history of anesthetic complications  Airway Mallampati: II  TM Distance: >3 FB Neck ROM: Full    Dental  (+) Teeth Intact, Dental Advisory Given   Pulmonary asthma , former smoker,    Pulmonary exam normal breath sounds clear to auscultation       Cardiovascular hypertension, Pt. on medications + Peripheral Vascular Disease  Normal cardiovascular exam Rhythm:Regular Rate:Normal     Neuro/Psych PSYCHIATRIC DISORDERS Anxiety negative neurological ROS     GI/Hepatic negative GI ROS, Neg liver ROS,   Endo/Other  negative endocrine ROS  Renal/GU negative Renal ROS     Musculoskeletal negative musculoskeletal ROS (+)   Abdominal   Peds  Hematology negative hematology ROS (+)   Anesthesia Other Findings Day of surgery medications reviewed with the patient.  Reproductive/Obstetrics                             Anesthesia Physical  Anesthesia Plan  ASA: II  Anesthesia Plan: General   Post-op Pain Management:    Induction: Intravenous  PONV Risk Score and Plan: 4 or greater and Ondansetron, Dexamethasone, Midazolam, Scopolamine patch - Pre-op and Promethazine  Airway Management Planned: Oral ETT  Additional Equipment:   Intra-op Plan:   Post-operative Plan: Extubation in OR  Informed Consent: I have reviewed the patients History and Physical, chart, labs and discussed the procedure including the risks, benefits and alternatives for the proposed anesthesia with the patient or authorized representative who has indicated his/her understanding and acceptance.   Dental advisory given  Plan Discussed with: CRNA  Anesthesia Plan Comments:  (Risks/benefits of general anesthesia discussed with patient including risk of damage to teeth, lips, gum, and tongue, nausea/vomiting, allergic reactions to medications, and the possibility of heart attack, stroke and death.  All patient questions answered.  Patient wishes to proceed.)        Anesthesia Quick Evaluation

## 2017-10-06 ENCOUNTER — Encounter (HOSPITAL_BASED_OUTPATIENT_CLINIC_OR_DEPARTMENT_OTHER): Payer: Self-pay | Admitting: Plastic Surgery

## 2017-10-14 ENCOUNTER — Ambulatory Visit: Payer: 59 | Admitting: Adult Health

## 2017-10-14 ENCOUNTER — Other Ambulatory Visit: Payer: 59

## 2017-10-27 ENCOUNTER — Ambulatory Visit: Payer: 59 | Admitting: Physical Therapy

## 2017-11-01 ENCOUNTER — Ambulatory Visit: Payer: 59 | Admitting: Physical Therapy

## 2017-11-24 ENCOUNTER — Encounter: Payer: Self-pay | Admitting: Family Medicine

## 2017-11-24 ENCOUNTER — Other Ambulatory Visit: Payer: Self-pay

## 2017-11-24 ENCOUNTER — Ambulatory Visit (INDEPENDENT_AMBULATORY_CARE_PROVIDER_SITE_OTHER): Payer: 59 | Admitting: Family Medicine

## 2017-11-24 VITALS — BP 138/78 | HR 68 | Temp 97.9°F | Resp 14 | Ht 61.0 in | Wt 124.0 lb

## 2017-11-24 DIAGNOSIS — R110 Nausea: Secondary | ICD-10-CM

## 2017-11-24 DIAGNOSIS — L03313 Cellulitis of chest wall: Secondary | ICD-10-CM

## 2017-11-24 MED ORDER — DOXYCYCLINE HYCLATE 100 MG PO TABS: 100.0000 mg | ORAL_TABLET | Freq: Two times a day (BID) | ORAL | 0 refills | Status: DC

## 2017-11-24 MED ORDER — PROMETHAZINE HCL 25 MG PO TABS: 25.0000 mg | ORAL_TABLET | Freq: Three times a day (TID) | ORAL | 0 refills | Status: DC | PRN

## 2017-11-24 NOTE — Progress Notes (Addendum)
Patient ID: Krystal Brooks, female    DOB: 07/30/1956, 62 y.o.   MRN: [id number]  PCP: Susy Frizzle, MD  Chief Complaint  Patient presents with  . Rash    x2 days- redness and burning rash to torso and L side of back    Subjective:   Krystal Brooks is a 62 y.o. female, with PMH of breast cancer s/p bilateral mastectomy with later reconstruction with bilateral breast implants which were later removed (10/05/2017), delayed wound healing to left anterior chest wall, she presents to the office today with a rash over her anterior chest bilaterally, she is concerned that it may be shingles.  2 days ago she had a small red area to her left anterior chest superior to surgical site.  Overnight it worsened and spread rapidly, with swelling and pain, despite her decreased sensation after multiple surgeries.  Patient denies any pruritus to the erythematous and swollen area.  Pain is moderate, constant, and worse with palpation.  No alleviating factors.  She did show her surgeon at a [place] appointment 2 days ago however it was very small at that time, the pt believed she had just rubbed it that morning with a towel, and she was instructed to apply topical Benadryl cream, which did not help.  The rash spread rapidly Tuesday evening. She also states that for the past several days she is felt very rundown, "like she has a flu" with fatigue, chills, nausea, decreased appetite.  She denies any sore throat, nasal congestion, cough, shortness of breath, fever, sweats, vomiting or diarrhea.   Her right chest wound has packing and is covered with gauze.  She has unchanged clear drainage from wound.  No purulence, odor.  No change except for surrounding rash with now extends to each axilla and roughly 10 cm below and above surgical scars.     Patient Active Problem List   Diagnosis Date Noted  . Breast cancer ([place]) 03/16/2017  . Malignant neoplasm of upper-inner quadrant of left breast in female, estrogen  receptor positive ([place]) 01/03/2017  . HTN (hypertension) 06/14/2016  . Malignant neoplasm of lower-inner quadrant of right breast of female, estrogen receptor positive ([place]) 01/07/2016  . Atherosclerosis of aorta ([place])   . Smoker      Prior to Admission medications   Medication Sig Start Date End Date Taking? Authorizing Provider  acetaminophen (TYLENOL) 500 MG tablet Take 1,500 mg by mouth daily as needed for moderate pain or headache.   Yes [provider]  albuterol (VENTOLIN HFA) 108 (90 Base) MCG/ACT inhaler USE 1-2 PUFFS EVERY 6 HOURS AS NEEDED FOR WHEEZING 06/06/17  Yes Susy Frizzle, MD  ANORO ELLIPTA 62.5-25 MCG/INH AEPB INHALE 1 PUFF INTO THE LUNGS DAILY 09/09/17  Yes Susy Frizzle, MD  Ascorbic Acid (VITAMIN C) 1000 MG tablet Take 1,000 mg by mouth daily.   Yes [provider]  losartan (COZAAR) 100 MG tablet Take 1 tablet (100 mg total) by mouth daily. 03/04/17  Yes Susy Frizzle, MD  montelukast (SINGULAIR) 10 MG tablet TAKE 1 TABLET BY MOUTH EVERY DAY Patient taking differently: TAKE 10 MG BY MOUTH EVERY DAY AT BEDTIME 02/21/17  Yes Susy Frizzle, MD  Multiple Vitamin (MULTIVITAMIN WITH MINERALS) TABS tablet Take 1 tablet by mouth daily.   Yes [provider]  tamoxifen (NOLVADEX) 20 MG tablet Take 20 mg by mouth daily.  03/23/17  Yes [provider]  triamterene-hydrochlorothiazide (MAXZIDE-25) 37.5-25 MG tablet Take 1  tablet by mouth daily. Stop amlodipine 05/10/17  Yes Susy Frizzle, MD     Allergies  Allergen Reactions  . Penicillins Swelling, Rash and Other (See Comments)    Older sister died from reaction to penicillin Has patient had a PCN reaction causing immediate rash, facial/tongue/throat swelling, SOB or lightheadedness with hypotension: Yes Has patient had a PCN reaction causing severe rash involving mucus membranes or skin necrosis: No Has patient had a PCN reaction that required hospitalization: No Has patient  had a PCN reaction occurring within the last 10 years: No If all of the above answers are "NO", then may proceed with Cephalosporin use.   . Sulfa Antibiotics Rash     Family History  Problem Relation Age of Onset  . Hearing loss Mother   . Asthma Father   . Cancer Father        melanoma, metastases   . Diabetes Father   . Heart disease Father   . Early death Sister   . Early death Paternal Grandfather   . Cancer Maternal Grandmother 60       colon cancer      Social History   Socioeconomic History  . Marital status: Married    Spouse name: Not on file  . Number of children: Not on file  . Years of education: Not on file  . Highest education level: Not on file  Occupational History  . Not on file  Social Needs  . Financial resource strain: Not on file  . Food insecurity:    Worry: Not on file    Inability: Not on file  . Transportation needs:    Medical: Not on file    Non-medical: Not on file  Tobacco Use  . Smoking status: Former Smoker    Packs/day: 0.50    Years: 40.00    Pack years: 20.00    Types: Cigarettes  . Smokeless tobacco: Never Used  Substance and Sexual Activity  . Alcohol use: Yes    Alcohol/week: 0.0 oz    Comment: social  . Drug use: No  . Sexual activity: Yes    Birth control/protection: Surgical    Comment: hysterectomy  Lifestyle  . Physical activity:    Days per week: Not on file    Minutes per session: Not on file  . Stress: Not on file  Relationships  . Social connections:    Talks on phone: Not on file    Gets together: Not on file    Attends religious service: Not on file    Active member of club or organization: Not on file    Attends meetings of clubs or organizations: Not on file    Relationship status: Not on file  . Intimate partner violence:    Fear of current or ex partner: Not on file    Emotionally abused: Not on file    Physically abused: Not on file    Forced sexual activity: Not on file  Other Topics  Concern  . Not on file  Social History Narrative  . Not on file     Review of Systems     Objective:    Vitals:   11/24/17 1207  BP: 138/78  Pulse: 68  Resp: 14  Temp: 97.9 F (36.6 C)  TempSrc: Oral  SpO2: 98%  Weight: 124 lb (56.2 kg)  Height: 5\' 1"  (1.549 m)     Physical Exam  Constitutional: She is oriented to person, place, and time. Vital signs are  normal. She appears well-developed and well-nourished.  Non-toxic appearance. No distress.  Tired appearing female, NAD, non-toxic appearing  HENT:  Head: Normocephalic and atraumatic.  Right Ear: External ear normal.  Left Ear: External ear normal.  Nose: Nose normal.  Mouth/Throat: Uvula is midline and mucous membranes are normal.  Eyes: Conjunctivae and lids are normal. Right eye exhibits no discharge. No scleral icterus.  Neck: Normal range of motion and phonation normal. No tracheal deviation present.  Cardiovascular: Normal rate, regular rhythm, normal heart sounds, intact distal pulses and normal pulses. Exam reveals no gallop and no friction rub.  No murmur heard. Pulses:      Radial pulses are 2+ on the right side, and 2+ on the left side.       Posterior tibial pulses are 2+ on the right side, and 2+ on the left side.  Pulmonary/Chest: Effort normal and breath sounds normal. No stridor. No respiratory distress. She has no wheezes. She has no rhonchi. She has no rales. She exhibits tenderness and edema. She exhibits no crepitus.  Right chest wound packed and secured with gauze and tape. Left chest surgical scar clean, dry, intact, with erythema and edema continuous over scar    Abdominal: Soft. Normal appearance and bowel sounds are normal. She exhibits no distension. There is no tenderness. There is no rebound and no guarding.  Musculoskeletal: Normal range of motion. She exhibits no edema or deformity.  Lymphadenopathy:    She has no cervical adenopathy.  Neurological: She is alert and oriented to person,  place, and time. She exhibits normal muscle tone. Coordination and gait normal.  Skin: Skin is warm and intact. Rash noted. She is not diaphoretic. There is erythema. No pallor.  Confluent area of erythema and edema to anterior chest wall, warm to the touch, ttp  Psychiatric: She has a normal mood and affect. Her speech is normal and behavior is normal.            Assessment & Plan:     Problem List Items Addressed This Visit    None    Visit Diagnoses    Cellulitis of chest wall    -  Primary      Concerning for infection to anterior chest wall, with rapid onset, tenderness, warmth, constitutional symptoms, and adjacent open wound with multiple recent surgeries.  Will cover with antibiotics, particularly to cover for MRSA.  Outlined the borders of erythema, and discussed with patient and her husband carefully watching the borders of the cellulitis making sure that they do not rapidly extend beyond the marked area.  They understand to go to the ER with rapid worsening.  Hope to see the erythema and edema decreased after 24 hours of antibiotics.         Patient will also try oral Benadryl to cover for any possible allergic component.       If improving, patient will follow-up on Monday (4 days from now)    Relevant Medications   doxycycline (VIBRA-TABS) 100 MG tablet   Nausea         Prescribed Phenergan to help with toleration of antibiotic with patient's current nausea   Relevant Medications   promethazine (PHENERGAN) 25 MG tablet      This patient was seen in a shared visit with Dr. Dennard Schaumann, who personally saw and evaluated the patient and agrees with the diagnosis of cellulitis and treatment with antibiotic.  Dr. Dennard Schaumann and I did discuss both with the patient and the patient's  husband concerning signs or symptoms to look for that are suggestive of deep tracking infections or worsening infections and the patient and the patient's husband verbalized understanding.  Patient was  nontoxic-appearing, was tolerating p.o.'s, vital signs were stable, and feel patient can first try oral antibiotics and outpatient treatment.  However, do realize patient has had multiple surgeries in the same area and will have a low threshold for referring to surgeon and/or ER.  Delsa Grana, PA-C 11/24/17 12:31 PM Patient seen in conjunction with Delsa Grana.  Caryl Never with her plan as outlined above.

## 2017-11-24 NOTE — Patient Instructions (Signed)
Start taking the antibiotic immediately.   Take benadryl 25-50 mg by mouth, every 4-6 hours as needed.  See if this helps decrease the swollen and red area.  Watch the borders of the cellulitis and in 24 hours we want it within that border. If not, or if it spreads beyond where it is currently, if you start vomiting, have fevers, confusion, sweats, these can be signs of a worsening infection, and you need to go to the ER to evaluation.  I will contact your surgeon.      Cellulitis, Adult Cellulitis is a skin infection. The infected area is usually red and tender. This condition occurs most often in the arms and lower legs. The infection can travel to the muscles, blood, and underlying tissue and become serious. It is very important to get treated for this condition. What are the causes? Cellulitis is caused by bacteria. The bacteria enter through a break in the skin, such as a cut, burn, insect bite, open sore, or crack. What increases the risk? This condition is more likely to occur in people who:  Have a weak defense system (immune system).  Have open wounds on the skin such as cuts, burns, bites, and scrapes. Bacteria can enter the body through these open wounds.  Are older.  Have diabetes.  Have a type of long-lasting (chronic) liver disease (cirrhosis) or kidney disease.  Use IV drugs.  What are the signs or symptoms? Symptoms of this condition include:  Redness, streaking, or spotting on the skin.  Swollen area of the skin.  Tenderness or pain when an area of the skin is touched.  Warm skin.  Fever.  Chills.  Blisters.  How is this diagnosed? This condition is diagnosed based on a medical history and physical exam. You may also have tests, including:  Blood tests.  Lab tests.  Imaging tests.  How is this treated? Treatment for this condition may include:  Medicines, such as antibiotic medicines or antihistamines.  Supportive care, such as rest and  application of cold or warm cloths (cold or warm compresses) to the skin.  Hospital care, if the condition is severe.  The infection usually gets better within 1-2 days of treatment. Follow these instructions at home:  Take over-the-counter and prescription medicines only as told by your health care provider.  If you were prescribed an antibiotic medicine, take it as told by your health care provider. Do not stop taking the antibiotic even if you start to feel better.  Drink enough fluid to keep your urine clear or pale yellow.  Do not touch or rub the infected area.  Raise (elevate) the infected area above the level of your heart while you are sitting or lying down.  Apply warm or cold compresses to the affected area as told by your health care provider.  Keep all follow-up visits as told by your health care provider. This is important. These visits let your health care provider make sure a more serious infection is not developing. Contact a health care provider if:  You have a fever.  Your symptoms do not improve within 1-2 days of starting treatment.  Your bone or joint underneath the infected area becomes painful after the skin has healed.  Your infection returns in the same area or another area.  You notice a swollen bump in the infected area.  You develop new symptoms.  You have a general ill feeling (malaise) with muscle aches and pains. Get help right away if:  Your symptoms get worse.  You feel very sleepy.  You develop vomiting or diarrhea that persists.  You notice red streaks coming from the infected area.  Your red area gets larger or turns dark in color. This information is not intended to replace advice given to you by your health care provider. Make sure you discuss any questions you have with your health care provider. Document Released: 05/26/2005 Document Revised: 12/25/2015 Document Reviewed: 06/25/2015 Elsevier Interactive Patient Education  [employer].

## 2017-11-29 ENCOUNTER — Ambulatory Visit: Payer: 59 | Admitting: Family Medicine

## 2017-12-01 ENCOUNTER — Encounter: Payer: Self-pay | Admitting: Family Medicine

## 2017-12-01 ENCOUNTER — Ambulatory Visit (INDEPENDENT_AMBULATORY_CARE_PROVIDER_SITE_OTHER): Payer: 59 | Admitting: Family Medicine

## 2017-12-01 VITALS — BP 108/70 | HR 88 | Temp 97.6°F | Resp 16 | Ht 61.0 in | Wt 122.0 lb

## 2017-12-01 DIAGNOSIS — R5383 Other fatigue: Secondary | ICD-10-CM

## 2017-12-01 NOTE — Progress Notes (Signed)
Subjective:    Patient ID: Krystal Brooks, female    DOB: 1955/10/18, 62 y.o.   MRN: [id number]  HPI  Was seen in clinic last week and diagnosed with cellulitis of the anterior chest wall.  Please see the clinic visit from last week.  Was started on doxycycline due to her allergy profile and cover for MRSA.  Patient states that the redness has improved however she still feels sick.  Patient saw her surgeon the following day.  Surgeon believes this is more likely allergic reaction to the antiseptic soap that was used prior to surgery.  Patient was placed on a combination of the antibiotic we prescribe plus a combination of antihistamines.  Rash slowly improved over 3-4 days.  However during the last week, the patient continues to feel extremely fatigued.  She states that feel like I have the flu.  She is weak.  She is lightheaded.  Whenever she stands up she feels dizzy.  After prolonged periods of standing and walking, she feels lightheaded like she could pass out.  Her blood pressure has been extremely low for her.  Today is 108/70.  This patient typically is hard to control and her blood pressure can range in the 979G-921 systolic.  Her husband states that her diastolic blood pressure has been as low as 40 during the last week.  One day the patient did not take her blood pressure medication by accident, and she felt much better the following day however being compliant, she resumed her blood pressure medication and the symptoms returned.  She denies any chest pain.  She denies any shortness of breath.  She denies any pleurisy.  She denies any hemoptysis.  She denies any nausea vomiting or diarrhea.  She denies any fever.  The erythema on her chest wall has completely subsided.  She denies any purulent drainage coming from the opening on the right lateral chest wall.  She denies any dysuria hematuria urgency frequency.  She denies any bone pain.  She denies any recent weight loss.  Past Medical History:    Diagnosis Date  . Adie's pupil    LEFT EYE  . Allergy   . Anxiety   . Asthma   . Atherosclerosis of aorta (HCC)    bulge above renal arteries on mri 2016  . Cancer [facility]) 2017   right breast ca  . Complication of anesthesia    VERY HARD TO WAKE UP    . Hypertension   . Personal history of radiation therapy   . PONV (postoperative nausea and vomiting)   . Smoker    Past Surgical History:  Procedure Laterality Date  . ABDOMINAL HYSTERECTOMY    . BREAST BIOPSY Right 01/02/2016  . BREAST EXCISIONAL BIOPSY Right 1979  . BREAST IMPLANT REMOVAL Bilateral 10/05/2017   Procedure: JHERDEYCX BILATERAL BREAST IMPLANTS;  Surgeon: Wallace Going, DO;  Location: [place];  Service: Plastics;  Laterality: Bilateral;  . BREAST LUMPECTOMY Right 01/27/2016  . BREAST LUMPECTOMY WITH RADIOACTIVE SEED AND SENTINEL LYMPH NODE BIOPSY Right 01/27/2016   Procedure: BREAST LUMPECTOMY WITH RADIOACTIVE SEED AND SENTINEL LYMPH NODE BIOPSY;  Surgeon: Excell Seltzer, MD;  Location: [place];  Service: General;  Laterality: Right;  . BREAST RECONSTRUCTION WITH PLACEMENT OF TISSUE EXPANDER AND FLEX HD (ACELLULAR HYDRATED DERMIS) Bilateral 03/16/2017   Procedure: BILATERAL IMMEDIATE BREAST RECONSTRUCTION WITH PLACEMENT OF TISSUE EXPANDER AND FLEX HD (ACELLULAR HYDRATED DERMIS);  Surgeon: Wallace Going, DO;  Location: [facility]  OR;  Service: Clinical cytogeneticist;  Laterality: Bilateral;  . BREAST SURGERY  1979   cyst removed rt breast  . CATARACT EXTRACTION W/ INTRAOCULAR LENS IMPLANT     RT EYE  . CESAREAN SECTION     x2  . MASTECTOMY W/ SENTINEL NODE BIOPSY Bilateral 03/16/2017   Procedure: BILATERAL TOTAL MASTECTOMY WITH LEFT SENTINEL LYMPH NODE BIOPSY;  Surgeon: Excell Seltzer, MD;  Location: [place];  Service: General;  Laterality: Bilateral;  . REMOVAL OF BILATERAL TISSUE EXPANDERS WITH PLACEMENT OF BILATERAL BREAST IMPLANTS Bilateral 06/15/2017   Procedure: REMOVAL OF BILATERAL  TISSUE EXPANDERS WITH PLACEMENT OF BILATERAL BREAST IMPLANTS;  Surgeon: Wallace Going, DO;  Location: [place];  Service: Plastics;  Laterality: Bilateral;  . TOTAL MASTECTOMY Bilateral 03/16/2017   SENTINAL NODE BIOPSY   Current Outpatient Medications on File Prior to Visit  Medication Sig Dispense Refill  . acetaminophen (TYLENOL) 500 MG tablet Take 1,500 mg by mouth daily as needed for moderate pain or headache.    . albuterol (VENTOLIN HFA) 108 (90 Base) MCG/ACT inhaler USE 1-2 PUFFS EVERY 6 HOURS AS NEEDED FOR WHEEZING 18 g 5  . ANORO ELLIPTA 62.5-25 MCG/INH AEPB INHALE 1 PUFF INTO THE LUNGS DAILY 60 each 10  . Ascorbic Acid (VITAMIN C) 1000 MG tablet Take 1,000 mg by mouth daily.    Marland Kitchen doxycycline (VIBRA-TABS) 100 MG tablet Take 1 tablet (100 mg total) by mouth 2 (two) times daily. 20 tablet 0  . losartan (COZAAR) 100 MG tablet Take 1 tablet (100 mg total) by mouth daily. 90 tablet 3  . montelukast (SINGULAIR) 10 MG tablet TAKE 1 TABLET BY MOUTH EVERY DAY (Patient taking differently: TAKE 10 MG BY MOUTH EVERY DAY AT BEDTIME) 90 tablet 3  . Multiple Vitamin (MULTIVITAMIN WITH MINERALS) TABS tablet Take 1 tablet by mouth daily.    . promethazine (PHENERGAN) 25 MG tablet Take 1 tablet (25 mg total) by mouth every 8 (eight) hours as needed for nausea or vomiting. 20 tablet 0  . tamoxifen (NOLVADEX) 20 MG tablet Take 20 mg by mouth daily.     Marland Kitchen triamterene-hydrochlorothiazide (MAXZIDE-25) 37.5-25 MG tablet Take 1 tablet by mouth daily. Stop amlodipine 90 tablet 3   No current facility-administered medications on file prior to visit.    Allergies  Allergen Reactions  . Penicillins Swelling, Rash and Other (See Comments)    Older sister died from reaction to penicillin Has patient had a PCN reaction causing immediate rash, facial/tongue/throat swelling, SOB or lightheadedness with hypotension: Yes Has patient had a PCN reaction causing severe rash involving mucus  membranes or skin necrosis: No Has patient had a PCN reaction that required hospitalization: No Has patient had a PCN reaction occurring within the last 10 years: No If all of the above answers are "NO", then may proceed with Cephalosporin use.   . Sulfa Antibiotics Rash   Social History   Socioeconomic History  . Marital status: Married    Spouse name: Not on file  . Number of children: Not on file  . Years of education: Not on file  . Highest education level: Not on file  Occupational History  . Not on file  Social Needs  . Financial resource strain: Not on file  . Food insecurity:    Worry: Not on file    Inability: Not on file  . Transportation needs:    Medical: Not on file    Non-medical: Not on file  Tobacco Use  . Smoking  status: Former Smoker    Packs/day: 0.50    Years: 40.00    Pack years: 20.00    Types: Cigarettes  . Smokeless tobacco: Never Used  Substance and Sexual Activity  . Alcohol use: Yes    Alcohol/week: 0.0 oz    Comment: social  . Drug use: No  . Sexual activity: Yes    Birth control/protection: Surgical    Comment: hysterectomy  Lifestyle  . Physical activity:    Days per week: Not on file    Minutes per session: Not on file  . Stress: Not on file  Relationships  . Social connections:    Talks on phone: Not on file    Gets together: Not on file    Attends religious service: Not on file    Active member of club or organization: Not on file    Attends meetings of clubs or organizations: Not on file    Relationship status: Not on file  . Intimate partner violence:    Fear of current or ex partner: Not on file    Emotionally abused: Not on file    Physically abused: Not on file    Forced sexual activity: Not on file  Other Topics Concern  . Not on file  Social History Narrative  . Not on file      Review of Systems  All other systems reviewed and are negative.      Objective:   Physical Exam  Constitutional: She appears  well-developed and well-nourished. No distress.  HENT:  Right Ear: External ear normal.  Left Ear: External ear normal.  Nose: Nose normal.  Mouth/Throat: Oropharynx is clear and moist. No oropharyngeal exudate.  Eyes: Conjunctivae are normal.  Neck: Neck supple. No JVD present. No thyromegaly present.  Cardiovascular: Normal rate, regular rhythm and normal heart sounds.  Pulmonary/Chest: Effort normal and breath sounds normal. No respiratory distress. She has no wheezes. She has no rales.  Abdominal: Soft. Bowel sounds are normal. She exhibits no distension and no mass. There is no tenderness. There is no rebound and no guarding.  Musculoskeletal: She exhibits no edema.  Lymphadenopathy:    She has no cervical adenopathy.  Skin: No rash noted. She is not diaphoretic. No erythema.  Vitals reviewed.         Assessment & Plan:  Fatigue, unspecified type - Plan: CBC with Differential/Platelet, COMPLETE METABOLIC PANEL WITH GFR, TSH, Vitamin B12  Most likely etiology of the patient's symptoms would be relative hypotension.  This can most likely be explained by an infection with resultant drop in blood pressure coupled with medication use.  Symptoms seem to improve when she held her medication for 1 day.  Therefore I recommended that she temporarily discontinue her Maxide, and reduce her dose of losartan by 50%.  I want her to call me Monday and let me know how she is feeling particular if her blood pressure improves.  Meanwhile I will workup other potential causes of fatigue.  I will check a CBC to see if the patient has developed anemia.  She has not had a hemoglobin in the postoperative period since February.  I will check a TSH to evaluate for hypothyroidism.  Patient states that she is also been extremely thirsty recently.  For instance she gave the example of drinking an entire jug of cranberry juice.  She also reports decreased urine output.  Therefore I will check a CMP to evaluate for  any signs of prerenal azotemia and also  check a random glucose to evaluate for possible signs of hyperglycemia.  While checking lab work I will also get a vitamin B12.  Recheck on Monday.  Given the patient's long-term history of smoking, her underlying history of COPD, if symptoms do not improve, a more thorough workup will be necessary next week including a chest x-ray as well as possible an echocardiogram if she continues to have fatigue and exertional lightheadedness.

## 2017-12-02 LAB — CBC WITH DIFFERENTIAL/PLATELET
BASOS PCT: 0.4 %
Basophils Absolute: 43 cells/uL (ref 0–200)
Eosinophils Absolute: 43 cells/uL (ref 15–500)
Eosinophils Relative: 0.4 %
HEMATOCRIT: 35.9 % (ref 35.0–45.0)
HEMOGLOBIN: 12.3 g/dL (ref 11.7–15.5)
LYMPHS ABS: 2506 {cells}/uL (ref 850–3900)
MCH: 28.7 pg (ref 27.0–33.0)
MCHC: 34.3 g/dL (ref 32.0–36.0)
MCV: 83.9 fL (ref 80.0–100.0)
MONOS PCT: 8 %
MPV: 10.3 fL (ref 7.5–12.5)
NEUTROS ABS: 7344 {cells}/uL (ref 1500–7800)
Neutrophils Relative %: 68 %
Platelets: 352 10*3/uL (ref 140–400)
RBC: 4.28 10*6/uL (ref 3.80–5.10)
RDW: 13.4 % (ref 11.0–15.0)
Total Lymphocyte: 23.2 %
WBC mixed population: 864 cells/uL (ref 200–950)
WBC: 10.8 10*3/uL (ref 3.8–10.8)

## 2017-12-02 LAB — COMPLETE METABOLIC PANEL WITH GFR
AG Ratio: 1.1 (calc) (ref 1.0–2.5)
ALBUMIN MSPROF: 3.7 g/dL (ref 3.6–5.1)
ALT: 25 U/L (ref 6–29)
AST: 19 U/L (ref 10–35)
Alkaline phosphatase (APISO): 68 U/L (ref 33–130)
BUN / CREAT RATIO: 28 (calc) — AB (ref 6–22)
BUN: 53 mg/dL — AB (ref 7–25)
CO2: 26 mmol/L (ref 20–32)
CREATININE: 1.89 mg/dL — AB (ref 0.50–0.99)
Calcium: 8.7 mg/dL (ref 8.6–10.4)
Chloride: 101 mmol/L (ref 98–110)
GFR, EST AFRICAN AMERICAN: 32 mL/min/{1.73_m2} — AB (ref >=60)
GFR, Est Non African American: 28 mL/min/{1.73_m2} — ABNORMAL LOW (ref >=60)
GLOBULIN: 3.3 g/dL (ref 1.9–3.7)
GLUCOSE: 98 mg/dL (ref 65–99)
Potassium: 4.5 mmol/L (ref 3.5–5.3)
Sodium: 135 mmol/L (ref 135–146)
TOTAL PROTEIN: 7 g/dL (ref 6.1–8.1)
Total Bilirubin: 0.4 mg/dL (ref 0.2–1.2)

## 2017-12-02 LAB — TSH: TSH: 0.68 mIU/L (ref 0.40–4.50)

## 2017-12-02 LAB — VITAMIN B12: Vitamin B-12: 899 pg/mL (ref 200–1100)

## 2017-12-08 ENCOUNTER — Ambulatory Visit (INDEPENDENT_AMBULATORY_CARE_PROVIDER_SITE_OTHER): Payer: 59 | Admitting: Family Medicine

## 2017-12-08 ENCOUNTER — Other Ambulatory Visit: Payer: Self-pay

## 2017-12-08 ENCOUNTER — Encounter: Payer: Self-pay | Admitting: Family Medicine

## 2017-12-08 VITALS — BP 132/84 | HR 78 | Temp 98.3°F | Resp 12 | Ht 61.0 in | Wt 128.0 lb

## 2017-12-08 DIAGNOSIS — N289 Disorder of kidney and ureter, unspecified: Secondary | ICD-10-CM | POA: Diagnosis not present

## 2017-12-08 LAB — BASIC METABOLIC PANEL WITH GFR
BUN: 10 mg/dL (ref 7–25)
CALCIUM: 7.8 mg/dL — AB (ref 8.6–10.4)
CHLORIDE: 104 mmol/L (ref 98–110)
CO2: 28 mmol/L (ref 20–32)
CREATININE: 0.87 mg/dL (ref 0.50–0.99)
GFR, Est African American: 83 mL/min/{1.73_m2} (ref >=60)
GFR, Est Non African American: 71 mL/min/{1.73_m2} (ref >=60)
GLUCOSE: 105 mg/dL — AB (ref 65–99)
Potassium: 3.1 mmol/L — ABNORMAL LOW (ref 3.5–5.3)
Sodium: 137 mmol/L (ref 135–146)

## 2017-12-08 LAB — EXTRA LAV TOP TUBE

## 2017-12-08 NOTE — Progress Notes (Signed)
Subjective:    Patient ID: Krystal Brooks, female    DOB: 01/27/1956, 62 y.o.   MRN: [id number]  HPI  Was seen in clinic last week and diagnosed with cellulitis of the anterior chest wall.  Please see the clinic visit from last week.  Was started on doxycycline due to her allergy profile and cover for MRSA.  Patient states that the redness has improved however she still feels sick.  Patient saw her surgeon the following day.  Surgeon believes this is more likely allergic reaction to the antiseptic soap that was used prior to surgery.  Patient was placed on a combination of the antibiotic we prescribe plus a combination of antihistamines.  Rash slowly improved over 3-4 days.  However during the last week, the patient continues to feel extremely fatigued.  She states that feel like I have the flu.  She is weak.  She is lightheaded.  Whenever she stands up she feels dizzy.  After prolonged periods of standing and walking, she feels lightheaded like she could pass out.  Her blood pressure has been extremely low for her.  Today is 108/70.  This patient typically is hard to control and her blood pressure can range in the 956L-875 systolic.  Her husband states that her diastolic blood pressure has been as low as 40 during the last week.  One day the patient did not take her blood pressure medication by accident, and she felt much better the following day however being compliant, she resumed her blood pressure medication and the symptoms returned.  She denies any chest pain.  She denies any shortness of breath.  She denies any pleurisy.  She denies any hemoptysis.  She denies any nausea vomiting or diarrhea.  She denies any fever.  The erythema on her chest wall has completely subsided.  She denies any purulent drainage coming from the opening on the right lateral chest wall.  She denies any dysuria hematuria urgency frequency.  She denies any bone pain.  She denies any recent weight loss.  At that time, my plan  was:  Most likely etiology of the patient's symptoms would be relative hypotension.  This can most likely be explained by an infection with resultant drop in blood pressure coupled with medication use.  Symptoms seem to improve when she held her medication for 1 day.  Therefore I recommended that she temporarily discontinue her Maxide, and reduce her dose of losartan by 50%.  I want her to call me Monday and let me know how she is feeling particular if her blood pressure improves.  Meanwhile I will workup other potential causes of fatigue.  I will check a CBC to see if the patient has developed anemia.  She has not had a hemoglobin in the postoperative period since February.  I will check a TSH to evaluate for hypothyroidism.  Patient states that she is also been extremely thirsty recently.  For instance she gave the example of drinking an entire jug of cranberry juice.  She also reports decreased urine output.  Therefore I will check a CMP to evaluate for any signs of prerenal azotemia and also check a random glucose to evaluate for possible signs of hyperglycemia.  While checking lab work I will also get a vitamin B12.  Recheck on Monday.  Given the patient's long-term history of smoking, her underlying history of COPD, if symptoms do not improve, a more thorough workup will be necessary next week including a chest x-ray as  well as possible an echocardiogram if she continues to have fatigue and exertional lightheadedness.  12/08/17 Patient's creatinine was >1.8.  Very dehydrated.  Held losartan and maxzide.  Feels much better and has gained 6 lbs of fluid by drinking and hydrating more.  Feels back to baseline.  BP is still good.  Past Medical History:  Diagnosis Date  . Adie's pupil    LEFT EYE  . Allergy   . Anxiety   . Asthma   . Atherosclerosis of aorta (HCC)    bulge above renal arteries on mri 2016  . Cancer [facility]) 2017   right breast ca  . Complication of anesthesia    VERY HARD TO WAKE UP     . Hypertension   . Personal history of radiation therapy   . PONV (postoperative nausea and vomiting)   . Smoker    Past Surgical History:  Procedure Laterality Date  . ABDOMINAL HYSTERECTOMY    . BREAST BIOPSY Right 01/02/2016  . BREAST EXCISIONAL BIOPSY Right 1979  . BREAST IMPLANT REMOVAL Bilateral 10/05/2017   Procedure: OVZCHYIFO BILATERAL BREAST IMPLANTS;  Surgeon: Wallace Going, DO;  Location: [place];  Service: Plastics;  Laterality: Bilateral;  . BREAST LUMPECTOMY Right 01/27/2016  . BREAST LUMPECTOMY WITH RADIOACTIVE SEED AND SENTINEL LYMPH NODE BIOPSY Right 01/27/2016   Procedure: BREAST LUMPECTOMY WITH RADIOACTIVE SEED AND SENTINEL LYMPH NODE BIOPSY;  Surgeon: Excell Seltzer, MD;  Location: [place];  Service: General;  Laterality: Right;  . BREAST RECONSTRUCTION WITH PLACEMENT OF TISSUE EXPANDER AND FLEX HD (ACELLULAR HYDRATED DERMIS) Bilateral 03/16/2017   Procedure: BILATERAL IMMEDIATE BREAST RECONSTRUCTION WITH PLACEMENT OF TISSUE EXPANDER AND FLEX HD (ACELLULAR HYDRATED DERMIS);  Surgeon: Wallace Going, DO;  Location: [place];  Service: Plastics;  Laterality: Bilateral;  . BREAST SURGERY  1979   cyst removed rt breast  . CATARACT EXTRACTION W/ INTRAOCULAR LENS IMPLANT     RT EYE  . CESAREAN SECTION     x2  . MASTECTOMY W/ SENTINEL NODE BIOPSY Bilateral 03/16/2017   Procedure: BILATERAL TOTAL MASTECTOMY WITH LEFT SENTINEL LYMPH NODE BIOPSY;  Surgeon: Excell Seltzer, MD;  Location: [place];  Service: General;  Laterality: Bilateral;  . REMOVAL OF BILATERAL TISSUE EXPANDERS WITH PLACEMENT OF BILATERAL BREAST IMPLANTS Bilateral 06/15/2017   Procedure: REMOVAL OF BILATERAL TISSUE EXPANDERS WITH PLACEMENT OF BILATERAL BREAST IMPLANTS;  Surgeon: Wallace Going, DO;  Location: [place];  Service: Plastics;  Laterality: Bilateral;  . TOTAL MASTECTOMY Bilateral 03/16/2017   SENTINAL NODE BIOPSY   Current  Outpatient Medications on File Prior to Visit  Medication Sig Dispense Refill  . acetaminophen (TYLENOL) 500 MG tablet Take 1,500 mg by mouth daily as needed for moderate pain or headache.    . albuterol (VENTOLIN HFA) 108 (90 Base) MCG/ACT inhaler USE 1-2 PUFFS EVERY 6 HOURS AS NEEDED FOR WHEEZING 18 g 5  . ANORO ELLIPTA 62.5-25 MCG/INH AEPB INHALE 1 PUFF INTO THE LUNGS DAILY 60 each 10  . Ascorbic Acid (VITAMIN C) 1000 MG tablet Take 1,000 mg by mouth daily.    Marland Kitchen losartan (COZAAR) 100 MG tablet Take 1 tablet (100 mg total) by mouth daily. (Patient not taking: Reported on 12/08/2017) 90 tablet 3  . montelukast (SINGULAIR) 10 MG tablet TAKE 1 TABLET BY MOUTH EVERY DAY (Patient taking differently: TAKE 10 MG BY MOUTH EVERY DAY AT BEDTIME) 90 tablet 3  . Multiple Vitamin (MULTIVITAMIN WITH MINERALS) TABS tablet Take 1 tablet by  mouth daily.    . promethazine (PHENERGAN) 25 MG tablet Take 1 tablet (25 mg total) by mouth every 8 (eight) hours as needed for nausea or vomiting. 20 tablet 0  . tamoxifen (NOLVADEX) 20 MG tablet Take 20 mg by mouth daily.     Marland Kitchen triamterene-hydrochlorothiazide (MAXZIDE-25) 37.5-25 MG tablet Take 1 tablet by mouth daily. Stop amlodipine (Patient not taking: Reported on 12/08/2017) 90 tablet 3   No current facility-administered medications on file prior to visit.    Allergies  Allergen Reactions  . Penicillins Swelling, Rash and Other (See Comments)    Older sister died from reaction to penicillin Has patient had a PCN reaction causing immediate rash, facial/tongue/throat swelling, SOB or lightheadedness with hypotension: Yes Has patient had a PCN reaction causing severe rash involving mucus membranes or skin necrosis: No Has patient had a PCN reaction that required hospitalization: No Has patient had a PCN reaction occurring within the last 10 years: No If all of the above answers are "NO", then may proceed with Cephalosporin use.   . Sulfa Antibiotics Rash   Social  History   Socioeconomic History  . Marital status: Married    Spouse name: Not on file  . Number of children: Not on file  . Years of education: Not on file  . Highest education level: Not on file  Occupational History  . Not on file  Social Needs  . Financial resource strain: Not on file  . Food insecurity:    Worry: Not on file    Inability: Not on file  . Transportation needs:    Medical: Not on file    Non-medical: Not on file  Tobacco Use  . Smoking status: Former Smoker    Packs/day: 0.50    Years: 40.00    Pack years: 20.00    Types: Cigarettes  . Smokeless tobacco: Never Used  Substance and Sexual Activity  . Alcohol use: Yes    Alcohol/week: 0.0 oz    Comment: social  . Drug use: No  . Sexual activity: Yes    Birth control/protection: Surgical    Comment: hysterectomy  Lifestyle  . Physical activity:    Days per week: Not on file    Minutes per session: Not on file  . Stress: Not on file  Relationships  . Social connections:    Talks on phone: Not on file    Gets together: Not on file    Attends religious service: Not on file    Active member of club or organization: Not on file    Attends meetings of clubs or organizations: Not on file    Relationship status: Not on file  . Intimate partner violence:    Fear of current or ex partner: Not on file    Emotionally abused: Not on file    Physically abused: Not on file    Forced sexual activity: Not on file  Other Topics Concern  . Not on file  Social History Narrative  . Not on file      Review of Systems  All other systems reviewed and are negative.      Objective:   Physical Exam  Constitutional: She appears well-developed and well-nourished. No distress.  HENT:  Right Ear: External ear normal.  Left Ear: External ear normal.  Nose: Nose normal.  Mouth/Throat: Oropharynx is clear and moist. No oropharyngeal exudate.  Eyes: Conjunctivae are normal.  Neck: Neck supple. No JVD present. No  thyromegaly present.  Cardiovascular: Normal  rate, regular rhythm and normal heart sounds.  Pulmonary/Chest: Effort normal and breath sounds normal. No respiratory distress. She has no wheezes. She has no rales.  Abdominal: Soft. Bowel sounds are normal. She exhibits no distension and no mass. There is no tenderness. There is no rebound and no guarding.  Musculoskeletal: She exhibits no edema.  Lymphadenopathy:    She has no cervical adenopathy.  Skin: No rash noted. She is not diaphoretic. No erythema.  Vitals reviewed.         Assessment & Plan:  Renal insufficiency - Plan: BASIC METABOLIC PANEL WITH GFR Recheck renal fcn to ensure recovery.  Continue to hold bp meds until bp is greater than 140/90.  Then resume losartan if necessary.  Slowly add back medication one at a time as needed for bp greater than 140/90.  At  Present, BP does not require medication.

## 2017-12-09 ENCOUNTER — Encounter (INDEPENDENT_AMBULATORY_CARE_PROVIDER_SITE_OTHER): Payer: Self-pay

## 2017-12-09 ENCOUNTER — Other Ambulatory Visit: Payer: Self-pay | Admitting: Family Medicine

## 2017-12-09 MED ORDER — POTASSIUM CHLORIDE CRYS ER 20 MEQ PO TBCR: 20.0000 meq | EXTENDED_RELEASE_TABLET | Freq: Every day | ORAL | 0 refills | Status: DC

## 2017-12-15 ENCOUNTER — Other Ambulatory Visit: Payer: Self-pay

## 2017-12-15 ENCOUNTER — Other Ambulatory Visit: Payer: 59

## 2017-12-15 DIAGNOSIS — R899 Unspecified abnormal finding in specimens from other organs, systems and tissues: Secondary | ICD-10-CM

## 2017-12-16 LAB — BASIC METABOLIC PANEL WITH GFR
BUN: 10 mg/dL (ref 7–25)
CALCIUM: 8.6 mg/dL (ref 8.6–10.4)
CO2: 30 mmol/L (ref 20–32)
Chloride: 99 mmol/L (ref 98–110)
Creat: 0.95 mg/dL (ref 0.50–0.99)
GFR, Est African American: 74 mL/min/{1.73_m2} (ref >=60)
GFR, Est Non African American: 64 mL/min/{1.73_m2} (ref >=60)
GLUCOSE: 130 mg/dL — AB (ref 65–99)
POTASSIUM: 3.7 mmol/L (ref 3.5–5.3)
SODIUM: 138 mmol/L (ref 135–146)

## 2017-12-19 ENCOUNTER — Encounter (INDEPENDENT_AMBULATORY_CARE_PROVIDER_SITE_OTHER): Payer: Self-pay

## 2017-12-29 ENCOUNTER — Encounter: Payer: Self-pay | Admitting: Family Medicine

## 2017-12-29 ENCOUNTER — Ambulatory Visit (INDEPENDENT_AMBULATORY_CARE_PROVIDER_SITE_OTHER): Payer: 59 | Admitting: Family Medicine

## 2017-12-29 VITALS — BP 156/80 | HR 106 | Temp 98.0°F | Resp 12 | Ht 61.0 in | Wt 125.0 lb

## 2017-12-29 DIAGNOSIS — J329 Chronic sinusitis, unspecified: Secondary | ICD-10-CM | POA: Diagnosis not present

## 2017-12-29 MED ORDER — LEVOFLOXACIN 500 MG PO TABS: 500.0000 mg | ORAL_TABLET | Freq: Every day | ORAL | 0 refills | Status: DC

## 2017-12-29 NOTE — Progress Notes (Signed)
Subjective:    Patient ID: Krystal Brooks, female    DOB: 04/09/1956, 62 y.o.   MRN: [id number]  HPI  Patient symptoms began approximately 6 days ago.  Symptoms include rhinorrhea, postnasal drip, pain and pressure in both frontal sinuses and both maxillary sinuses, sore throat due to postnasal drip, cough due to postnasal drip.  Cough is productive of green sputum.  She is also been wheezing more and having to use albuterol.  She had subjective fevers at the beginning however the fevers have subsided and now her biggest complaint is head congestion, sinus pain, and postnasal drip causing her cough. Past Medical History:  Diagnosis Date  . Adie's pupil    LEFT EYE  . Allergy   . Anxiety   . Asthma   . Atherosclerosis of aorta (HCC)    bulge above renal arteries on mri 2016  . Cancer [facility]) 2017   right breast ca  . Complication of anesthesia    VERY HARD TO WAKE UP    . Hypertension   . Personal history of radiation therapy   . PONV (postoperative nausea and vomiting)   . Smoker    Past Surgical History:  Procedure Laterality Date  . ABDOMINAL HYSTERECTOMY    . BREAST BIOPSY Right 01/02/2016  . BREAST EXCISIONAL BIOPSY Right 1979  . BREAST IMPLANT REMOVAL Bilateral 10/05/2017   Procedure: [id number] BILATERAL BREAST IMPLANTS;  Surgeon: Wallace Going, DO;  Location: [place];  Service: Plastics;  Laterality: Bilateral;  . BREAST LUMPECTOMY Right 01/27/2016  . BREAST LUMPECTOMY WITH RADIOACTIVE SEED AND SENTINEL LYMPH NODE BIOPSY Right 01/27/2016   Procedure: BREAST LUMPECTOMY WITH RADIOACTIVE SEED AND SENTINEL LYMPH NODE BIOPSY;  Surgeon: Excell Seltzer, MD;  Location: [place];  Service: General;  Laterality: Right;  . BREAST RECONSTRUCTION WITH PLACEMENT OF TISSUE EXPANDER AND FLEX HD (ACELLULAR HYDRATED DERMIS) Bilateral 03/16/2017   Procedure: BILATERAL IMMEDIATE BREAST RECONSTRUCTION WITH PLACEMENT OF TISSUE EXPANDER AND FLEX HD  (ACELLULAR HYDRATED DERMIS);  Surgeon: Wallace Going, DO;  Location: [place];  Service: Plastics;  Laterality: Bilateral;  . BREAST SURGERY  1979   cyst removed rt breast  . CATARACT EXTRACTION W/ INTRAOCULAR LENS IMPLANT     RT EYE  . CESAREAN SECTION     x2  . MASTECTOMY W/ SENTINEL NODE BIOPSY Bilateral 03/16/2017   Procedure: BILATERAL TOTAL MASTECTOMY WITH LEFT SENTINEL LYMPH NODE BIOPSY;  Surgeon: Excell Seltzer, MD;  Location: [place];  Service: General;  Laterality: Bilateral;  . REMOVAL OF BILATERAL TISSUE EXPANDERS WITH PLACEMENT OF BILATERAL BREAST IMPLANTS Bilateral 06/15/2017   Procedure: REMOVAL OF BILATERAL TISSUE EXPANDERS WITH PLACEMENT OF BILATERAL BREAST IMPLANTS;  Surgeon: Wallace Going, DO;  Location: [place];  Service: Plastics;  Laterality: Bilateral;  . TOTAL MASTECTOMY Bilateral 03/16/2017   SENTINAL NODE BIOPSY   Current Outpatient Medications on File Prior to Visit  Medication Sig Dispense Refill  . acetaminophen (TYLENOL) 500 MG tablet Take 1,500 mg by mouth daily as needed for moderate pain or headache.    . albuterol (VENTOLIN HFA) 108 (90 Base) MCG/ACT inhaler USE 1-2 PUFFS EVERY 6 HOURS AS NEEDED FOR WHEEZING 18 g 5  . ANORO ELLIPTA 62.5-25 MCG/INH AEPB INHALE 1 PUFF INTO THE LUNGS DAILY 60 each 10  . Ascorbic Acid (VITAMIN C) 1000 MG tablet Take 1,000 mg by mouth daily.    Marland Kitchen losartan (COZAAR) 100 MG tablet Take 1 tablet (100 mg total) by mouth  daily. 90 tablet 3  . montelukast (SINGULAIR) 10 MG tablet TAKE 1 TABLET BY MOUTH EVERY DAY (Patient taking differently: TAKE 10 MG BY MOUTH EVERY DAY AT BEDTIME) 90 tablet 3  . Multiple Vitamin (MULTIVITAMIN WITH MINERALS) TABS tablet Take 1 tablet by mouth daily.    . potassium chloride SA (K-DUR,KLOR-CON) 20 MEQ tablet Take 1 tablet (20 mEq total) by mouth daily. 30 tablet 0  . promethazine (PHENERGAN) 25 MG tablet Take 1 tablet (25 mg total) by mouth every 8 (eight) hours as needed for  nausea or vomiting. 20 tablet 0  . tamoxifen (NOLVADEX) 20 MG tablet Take 20 mg by mouth daily.     Marland Kitchen triamterene-hydrochlorothiazide (MAXZIDE-25) 37.5-25 MG tablet Take 1 tablet by mouth daily. Stop amlodipine 90 tablet 3   No current facility-administered medications on file prior to visit.    Allergies  Allergen Reactions  . Penicillins Swelling, Rash and Other (See Comments)    Older sister died from reaction to penicillin Has patient had a PCN reaction causing immediate rash, facial/tongue/throat swelling, SOB or lightheadedness with hypotension: Yes Has patient had a PCN reaction causing severe rash involving mucus membranes or skin necrosis: No Has patient had a PCN reaction that required hospitalization: No Has patient had a PCN reaction occurring within the last 10 years: No If all of the above answers are "NO", then may proceed with Cephalosporin use.   . Sulfa Antibiotics Rash   Social History   Socioeconomic History  . Marital status: Married    Spouse name: Not on file  . Number of children: Not on file  . Years of education: Not on file  . Highest education level: Not on file  Occupational History  . Not on file  Social Needs  . Financial resource strain: Not on file  . Food insecurity:    Worry: Not on file    Inability: Not on file  . Transportation needs:    Medical: Not on file    Non-medical: Not on file  Tobacco Use  . Smoking status: Former Smoker    Packs/day: 0.50    Years: 40.00    Pack years: 20.00    Types: Cigarettes  . Smokeless tobacco: Never Used  Substance and Sexual Activity  . Alcohol use: Yes    Alcohol/week: 0.0 oz    Comment: social  . Drug use: No  . Sexual activity: Yes    Birth control/protection: Surgical    Comment: hysterectomy  Lifestyle  . Physical activity:    Days per week: Not on file    Minutes per session: Not on file  . Stress: Not on file  Relationships  . Social connections:    Talks on phone: Not on file     Gets together: Not on file    Attends religious service: Not on file    Active member of club or organization: Not on file    Attends meetings of clubs or organizations: Not on file    Relationship status: Not on file  . Intimate partner violence:    Fear of current or ex partner: Not on file    Emotionally abused: Not on file    Physically abused: Not on file    Forced sexual activity: Not on file  Other Topics Concern  . Not on file  Social History Narrative  . Not on file     Review of Systems  All other systems reviewed and are negative.  Objective:   Physical Exam  Constitutional: She appears well-developed and well-nourished. No distress.  HENT:  Head: Normocephalic and atraumatic.  Right Ear: External ear normal.  Left Ear: External ear normal.  Nose: Mucosal edema and rhinorrhea present. Right sinus exhibits maxillary sinus tenderness and frontal sinus tenderness. Left sinus exhibits maxillary sinus tenderness and frontal sinus tenderness.  Mouth/Throat: Oropharynx is clear and moist. No oropharyngeal exudate.  Neck: Normal range of motion. No tracheal deviation present.  Cardiovascular: Normal rate, regular rhythm and normal heart sounds.  Pulmonary/Chest: Effort normal and breath sounds normal. No stridor. No respiratory distress. She has no wheezes. She has no rales. She exhibits no tenderness.  Lymphadenopathy:    She has no cervical adenopathy.  Skin: She is not diaphoretic.  Vitals reviewed.         Assessment & Plan:  Rhinosinusitis  Patient has a sinus infection.  She is using Sudafed which explains her elevated blood pressure and symptoms are not improving.  I explained to the patient that 90% of sinus infections improve in 7 to 10 days on antibiotics and 90% improved in 7 to 10 days off antibiotics.  Therefore recommended tincture of time for another 3 to 4 days and symptoms should gradually improve.  I recommended discontinuation of Sudafed due  to the elevated blood pressure and replacing this with a combination of Mucinex, Zyrtec, and nasal saline.  If symptoms worsen, or if symptoms persist longer than 10 days, she can use Levaquin 500 mg p.o. daily for 7 days for bacterial sinusitis.  Patient wants to avoid unnecessary use of antibiotics and agrees with this plan.  In the past she has had to use steroids for bronchitis.  There is no wheezing on exam today and therefore I see no indication for prednisone.  Patient has also quit smoking.

## 2018-01-03 NOTE — Progress Notes (Signed)
[place]  [phone] [phone]     ID: Krystal Brooks DOB: 1956-03-17  MR#: [id number]  HYI#:[id number]  Patient Care Team: Susy Frizzle, MD as PCP - General (Family Medicine) Excell Seltzer, MD as Consulting Physician (General Surgery) Druscilla Brownie, MD as Consulting Physician (Dermatology) Dillingham, [name], DO as Attending Physician (Plastic Surgery) Kirstyn Lean, Virgie Dad, MD as Consulting Physician (Oncology) Delice Bison, Charlestine Massed, NP as Nurse Practitioner (Hematology and Oncology) OTHER MD:  CHIEF COMPLAINT: Recurrent estrogen receptor positive breast cancer  CURRENT TREATMENT: Tamoxifen  INTERVAL HISTORY: Krystal Brooks returns today for follow-up and treatment of her estrogen receptor positive breast cancer. Krystal Brooks continues on tamoxifen, with good tolerance. Krystal Brooks denies issues with hot flashes or increased vaginal discharge.    REVIEW OF SYSTEMS: Krystal Brooks had mastectomies in July 2018 and implant reconstruction in September 2018. Krystal Brooks developed skin tightening and pulling complications, so Krystal Brooks decided to remove the implants in February 2019. Krystal Brooks is not planning to have implants again, but instead Krystal Brooks plans to have a lattisimus flap reconstruction on the right. Krystal Brooks had extensive  Draining that just stopped two weeks ago. Krystal Brooks developed cellulitis, dehydration, and other issues since her implant removal.  Krystal Brooks did seek a second opinion, with Dr. Harle Stanford, but found him "too intense" and Krystal Brooks is very comfortable with Dr. Marla Roe.  Krystal Brooks denies unusual headaches, visual changes, nausea, vomiting, or dizziness. There has been no unusual cough, phlegm production, or pleurisy. This been no change in bowel or bladder habits. Krystal Brooks denies unexplained fatigue or unexplained weight loss, bleeding, rash, or fever. A detailed review of systems was otherwise stable.   BREAST CANCER HISTORY: From Dr Ernestina Penna 01/14/16 note:  "Krystal Brooks 62 y.o. female is here because  of Her newly diagnosed right breast cancer. Krystal Brooks is accompanied by her husband and of friend to our multidisciplinary rest clinic today.  This was found by screening mammogram,  Krystal Brooks denies any palpable mass, skin change or nipple discharge, or ulcer constitutional symptoms before the screening.  Her prior mammo 6 years ago. Krystal Brooks had a right breast cyst removed in 1979. "  Malignant neoplasm of lower-inner quadrant of right breast of female, estrogen receptor positive ([place])   12/29/2015 Mammogram    Diagnostic right mammogram showed a 2.3 x 2.1 x 2.2 cm group of coarse calcification within the medial slightly lower right breast.      01/02/2016 Initial Diagnosis    Breast cancer of lower-inner quadrant of right female breast ([place])      01/02/2016 Initial Biopsy    right breast LIQ core needle biopsy showed LCIS, with a small focus of invasive lobular carcinoma, grade 2.       01/02/2016 Receptors her2    ER 10% positive, moderate staining. PR negative. Insufficient tissue for HER-2 testing.      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 0/2 SLN.       01/27/2016 Receptors her2    The surgical sample of the invasive carcinoma was not sufficient for Her2 test and other additional studies.      03/09/2016 - 04/19/2016 Radiation Therapy    Adjuvant breast radiation [name]). Right breast: 50 Gy in 25 fractions. Right breast "  boost": 10 Gy in 5 fractions.        04/2016 -  Anti-estrogen oral therapy    Anastrozole 1 mg daily. Planned duration of therapy: 5 years       PAST MEDICAL HISTORY: Past Medical History:  Diagnosis Date  . Adie's pupil    LEFT EYE  . Allergy     . Anxiety   . Asthma   . Atherosclerosis of aorta (HCC)    bulge above renal arteries on mri 2016  . Cancer [facility]) 2017   right breast ca  . Complication of anesthesia    VERY HARD TO WAKE UP    . Hypertension   . Personal history of radiation therapy   . PONV (postoperative nausea and vomiting)   . Smoker     PAST SURGICAL HISTORY: Past Surgical History:  Procedure Laterality Date  . ABDOMINAL HYSTERECTOMY    . BREAST BIOPSY Right 01/02/2016  . BREAST EXCISIONAL BIOPSY Right 1979  . BREAST IMPLANT REMOVAL Bilateral 10/05/2017   Procedure: VFIEPPIRJ BILATERAL BREAST IMPLANTS;  Surgeon: Wallace Going, DO;  Location: [place];  Service: Plastics;  Laterality: Bilateral;  . BREAST LUMPECTOMY Right 01/27/2016  . BREAST LUMPECTOMY WITH RADIOACTIVE SEED AND SENTINEL LYMPH NODE BIOPSY Right 01/27/2016   Procedure: BREAST LUMPECTOMY WITH RADIOACTIVE SEED AND SENTINEL LYMPH NODE BIOPSY;  Surgeon: Excell Seltzer, MD;  Location: [place];  Service: General;  Laterality: Right;  . BREAST RECONSTRUCTION WITH PLACEMENT OF TISSUE EXPANDER AND FLEX HD (ACELLULAR HYDRATED DERMIS) Bilateral 03/16/2017   Procedure: BILATERAL IMMEDIATE BREAST RECONSTRUCTION WITH PLACEMENT OF TISSUE EXPANDER AND FLEX HD (ACELLULAR HYDRATED DERMIS);  Surgeon: Wallace Going, DO;  Location: [place];  Service: Plastics;  Laterality: Bilateral;  . BREAST SURGERY  1979   cyst removed rt breast  . CATARACT EXTRACTION W/ INTRAOCULAR LENS IMPLANT     RT EYE  . CESAREAN SECTION     x2  . MASTECTOMY W/ SENTINEL NODE BIOPSY Bilateral 03/16/2017   Procedure: BILATERAL TOTAL MASTECTOMY WITH LEFT SENTINEL LYMPH NODE BIOPSY;  Surgeon: Excell Seltzer, MD;  Location: [place];  Service: General;  Laterality: Bilateral;  . REMOVAL OF BILATERAL TISSUE EXPANDERS WITH PLACEMENT OF BILATERAL BREAST IMPLANTS Bilateral 06/15/2017   Procedure: REMOVAL OF BILATERAL TISSUE EXPANDERS WITH PLACEMENT OF  BILATERAL BREAST IMPLANTS;  Surgeon: Wallace Going, DO;  Location: [place];  Service: Plastics;  Laterality: Bilateral;  . TOTAL MASTECTOMY Bilateral 03/16/2017   SENTINAL NODE BIOPSY    FAMILY HISTORY Family History  Problem Relation Age of Onset  . Hearing loss Mother   . Asthma Father   . Cancer Father        melanoma, metastases   . Diabetes Father   . Heart disease Father   . Early death Sister   . Early death Paternal Grandfather   . Cancer Maternal Grandmother 53       colon cancer   The patient's father died at age 19. Krystal Brooks had been diagnosed with melanoma in 1 year. That was not the cause of death. The patient's mother is living at age 67 as of May 2018. The patient has one brother, no sisters. There is no history of breast or ovarian cancer in the family, and no other melanoma history.  GYNECOLOGIC HISTORY:  No LMP recorded. Patient has had a hysterectomy. 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 Krystal Brooks is now retired. Krystal Brooks frequently keeps her 2 grandchildren. Her husband Clair Gulling is a retired Airline pilot. He now owns a business where they prepare the front car of a trailer hoarse hitch combination. Daughter April lives in [place] where Krystal Brooks works as a Statistician. Daughter Ailene Ravel lives in [place] where Krystal Brooks is an Forensic scientist for [employer]. The patient has 4 grandchildren. 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    ADVANCED DIRECTIVES:    HEALTH MAINTENANCE: Social History   Tobacco Use  . Smoking status: Former Smoker    Packs/day: 0.50    Years: 40.00    Pack years: 20.00    Types: Cigarettes  . Smokeless tobacco: Never Used  Substance Use Topics  . Alcohol use: Yes    Alcohol/week: 0.0 oz    Comment: social  . Drug use: No     Colonoscopy:  PAP:  Bone  density:05/24/2016 showed a T score of -0.6 normal   Allergies  Allergen Reactions  . Penicillins Swelling, Rash and Other (See Comments)    Older sister died from reaction to penicillin Has patient had a PCN reaction causing immediate rash, facial/tongue/throat swelling, SOB or lightheadedness with hypotension: Yes Has patient had a PCN reaction causing severe rash involving mucus membranes or skin necrosis: No Has patient had a PCN reaction that required hospitalization: No Has patient had a PCN reaction occurring within the last 10 years: No If all of the above answers are "NO", then may proceed with Cephalosporin use.   . Sulfa Antibiotics Rash    Current Outpatient Medications  Medication Sig Dispense Refill  . acetaminophen (TYLENOL) 500 MG tablet Take 1,500 mg by mouth daily as needed for moderate pain or headache.    . albuterol (VENTOLIN HFA) 108 (90 Base) MCG/ACT inhaler USE 1-2 PUFFS EVERY 6 HOURS AS NEEDED FOR WHEEZING 18 g 5  . ANORO ELLIPTA 62.5-25 MCG/INH AEPB INHALE 1 PUFF INTO THE LUNGS DAILY 60 each 10  . Ascorbic Acid (VITAMIN C) 1000 MG tablet Take 1,000 mg by mouth daily.    Marland Kitchen levofloxacin (LEVAQUIN) 500 MG tablet Take 1 tablet (500 mg total) by mouth daily. 7 tablet 0  . losartan (COZAAR) 100 MG tablet Take 1 tablet (100 mg total) by mouth daily. 90 tablet 3  . montelukast (SINGULAIR) 10 MG tablet TAKE 1 TABLET BY MOUTH EVERY DAY (Patient taking differently: TAKE 10 MG BY MOUTH EVERY DAY AT BEDTIME) 90 tablet 3  . Multiple Vitamin (MULTIVITAMIN WITH MINERALS) TABS tablet Take 1 tablet by mouth daily.    . potassium chloride SA (K-DUR,KLOR-CON) 20 MEQ tablet Take 1 tablet (20 mEq total) by mouth daily. 30 tablet 0  . promethazine (PHENERGAN) 25 MG tablet Take 1 tablet (25 mg total) by mouth every 8 (eight) hours as needed for nausea or vomiting. 20 tablet 0  . tamoxifen (NOLVADEX) 20 MG tablet Take 20 mg by mouth daily.     Marland Kitchen triamterene-hydrochlorothiazide  (MAXZIDE-25) 37.5-25 MG tablet Take 1 tablet by mouth daily. Stop amlodipine 90 tablet 3   No current facility-administered medications for this visit.     OBJECTIVE: Middle-aged white woman who appears stated age  38:   01/10/18 1021  BP: (!) 142/80  Pulse: 98  Resp: 18  Temp: 98.4 F (36.9 C)  SpO2: 100%     Body mass index is 24.13 kg/m.    ECOG  FS:1 - Symptomatic but completely ambulatory  Sclerae unicteric, pupils round and equal Oropharynx clear and moist No cervical or supraclavicular adenopathy Lungs no rales or rhonchi Heart regular rate and rhythm Abd soft, nontender, positive bowel sounds MSK no focal spinal tenderness, no upper extremity lymphedema Neuro: nonfocal, well oriented, appropriate affect Breasts: Krystal Brooks is status post bilateral mastectomies.  On the right the incision is extremely irregular and very tight, limiting right upper extremity range of motion.  On the left it is unremarkable.  There is no evidence of chest wall recurrence.  Both axillae are benign.  LAB RESULTS:  CMP     Component Value Date/Time   NA 138 12/15/2017 1541   NA 137 07/13/2017 1035   K 3.7 12/15/2017 1541   K 4.3 07/13/2017 1035   CL 99 12/15/2017 1541   CO2 30 12/15/2017 1541   CO2 27 07/13/2017 1035   GLUCOSE 130 (H) 12/15/2017 1541   GLUCOSE 94 07/13/2017 1035   BUN 10 12/15/2017 1541   BUN 15.4 07/13/2017 1035   CREATININE 0.95 12/15/2017 1541   CREATININE 0.8 07/13/2017 1035   CALCIUM 8.6 12/15/2017 1541   CALCIUM 9.3 07/13/2017 1035   PROT 7.0 12/01/2017 1432   PROT 7.7 07/13/2017 1035   ALBUMIN 3.5 07/13/2017 1035   AST 19 12/01/2017 1432   AST 15 07/13/2017 1035   ALT 25 12/01/2017 1432   ALT 14 07/13/2017 1035   ALKPHOS 73 07/13/2017 1035   BILITOT 0.4 12/01/2017 1432   BILITOT 0.22 07/13/2017 1035   GFRNONAA 64 12/15/2017 1541   GFRAA 74 12/15/2017 1541    No results found for: TOTALPROTELP, ALBUMINELP, A1GS, A2GS, BETS, BETA2SER, GAMS, MSPIKE,  SPEI  No results found for: Nils Pyle, [facility]  Lab Results  Component Value Date   WBC 5.9 01/10/2018   NEUTROABS 4.0 01/10/2018   HGB 11.2 (L) 01/10/2018   HCT 33.5 (L) 01/10/2018   MCV 88.2 01/10/2018   PLT 194 01/10/2018      Chemistry      Component Value Date/Time   NA 138 12/15/2017 1541   NA 137 07/13/2017 1035   K 3.7 12/15/2017 1541   K 4.3 07/13/2017 1035   CL 99 12/15/2017 1541   CO2 30 12/15/2017 1541   CO2 27 07/13/2017 1035   BUN 10 12/15/2017 1541   BUN 15.4 07/13/2017 1035   CREATININE 0.95 12/15/2017 1541   CREATININE 0.8 07/13/2017 1035      Component Value Date/Time   CALCIUM 8.6 12/15/2017 1541   CALCIUM 9.3 07/13/2017 1035   ALKPHOS 73 07/13/2017 1035   AST 19 12/01/2017 1432   AST 15 07/13/2017 1035   ALT 25 12/01/2017 1432   ALT 14 07/13/2017 1035   BILITOT 0.4 12/01/2017 1432   BILITOT 0.22 07/13/2017 1035       No results found for: LABCA2  No components found for: [id number]  No results for input(s): INR in the last 168 hours.  Urinalysis No results found for: COLORURINE, APPEARANCEUR, LABSPEC, PHURINE, GLUCOSEU, HGBUR, BILIRUBINUR, KETONESUR, PROTEINUR, UROBILINOGEN, NITRITE, LEUKOCYTESUR   STUDIES: No results found.   ELIGIBLE FOR AVAILABLE RESEARCH PROTOCOL: no  ASSESSMENT: 62 y.o. [place] woman, with bilateral breast cancer  (1) status post right lumpectomy and sentinel lymph node sampling 01/27/2016 for lobular carcinoma in situ, pleomorphic variant, with multiple foci of invasive lobular carcinoma, all less than a millimeter, but with negative margins and both sentinel lymph nodes negative, invasive disease being estrogen receptor positive, HER-2 not  tested (pT1 [mic] pN0}  (2) adjuvant radiation 03/09/2016 through 04/19/2016 The patient initially received a dose of 50Gy in 60factions to the breast using whole-breast tangent fields. This was delivered using a 3-D conformal technique. The  patient then received a boost to the seroma. This delivered an additional 10Gy in 574fctions using an en face electron field due to the depth of the seroma. The total dose was 60Gy.  (3) anastrozole started June 2017  (4) status post left breast lower inner quadrant biopsy 12/29/2016 for a clinical T1c N0  invasive lobular breast cancer, grade 2, estrogen receptor positive, progesterone receptor negative HER-2 nonamplified, with a signals ratio 1.35 and the number per cell 2.70.  (5) intermediate Oncotype score of 22 predicts a 10 year risk of recurrence outside the breast of 14% if the patient's only systemic therapy is tamoxifen for 5 years.  (a) the patient opted against chemotherapy (TLovena Lex results confirm choice)  (6) status post bilateral mastectomies and left sentinel lymph node sampling 03/16/2017 showing  (a) on the right, no evidence of carcinoma  (b) on the left, residual pT2 pN0, stage IIA invasive lobular carcinoma, grade 2, with negative margins.  (7) had immediate expander placement at the time of bilateral mastectomies  (a) definitive implant placement with bilateral capsulotomies June 15, 2017, with benign pathology  (8) started tamoxifen 04/30/2017  PLAN: Krystal Brooks soon be a year out from bilateral mastectomies, with no evidence of disease recurrence.  This is favorable.  Krystal Brooks is tolerating tamoxifen well and the plan will be to continue that a minimum of 5 years.  The big issue of course has been the reconstruction and its complications.  Hopefully with the final procedure Krystal Brooks is planning, and latissimus flap on the right, Krystal Brooks will have a little bit better range of motion and a little bit less discomfort in that area  Krystal Brooks is going to see me again in October.  From that point I will start seeing her on a once a year basis  Krystal Brooks knows to call for any other issues that may develop before that visit. Dartagnan Beavers, GuVirgie DadMD  01/10/18 10:44 AM Medical Oncology and  Hematology [facility] [place] [address]. [phone]  Fax. [phone]  This document serves as a record of services personally performed by GuLurline DelMD. It was created on his behalf by ArSheron Nightingalea trained medical scribe. The creation of this record is based on the scribe's personal observations and the provider's statements to them.    I have reviewed the above documentation for accuracy and completeness, and I agree with the above.

## 2018-01-10 ENCOUNTER — Telehealth: Payer: Self-pay | Admitting: Oncology

## 2018-01-10 ENCOUNTER — Inpatient Hospital Stay: Payer: 59

## 2018-01-10 ENCOUNTER — Inpatient Hospital Stay: Payer: 59 | Attending: Oncology | Admitting: Oncology

## 2018-01-10 VITALS — BP 142/80 | HR 98 | Temp 98.4°F | Resp 18 | Ht 61.0 in | Wt 127.7 lb

## 2018-01-10 DIAGNOSIS — C50212 Malignant neoplasm of upper-inner quadrant of left female breast: Secondary | ICD-10-CM

## 2018-01-10 DIAGNOSIS — Z87891 Personal history of nicotine dependence: Secondary | ICD-10-CM | POA: Insufficient documentation

## 2018-01-10 DIAGNOSIS — Z9013 Acquired absence of bilateral breasts and nipples: Secondary | ICD-10-CM | POA: Insufficient documentation

## 2018-01-10 DIAGNOSIS — Z170 Estrogen receptor positive status [ER+]: Secondary | ICD-10-CM | POA: Insufficient documentation

## 2018-01-10 DIAGNOSIS — Z79810 Long term (current) use of selective estrogen receptor modulators (SERMs): Secondary | ICD-10-CM | POA: Diagnosis not present

## 2018-01-10 DIAGNOSIS — Z923 Personal history of irradiation: Secondary | ICD-10-CM | POA: Diagnosis not present

## 2018-01-10 DIAGNOSIS — C50311 Malignant neoplasm of lower-inner quadrant of right female breast: Secondary | ICD-10-CM

## 2018-01-10 DIAGNOSIS — Z90710 Acquired absence of both cervix and uterus: Secondary | ICD-10-CM | POA: Insufficient documentation

## 2018-01-10 DIAGNOSIS — C50312 Malignant neoplasm of lower-inner quadrant of left female breast: Secondary | ICD-10-CM | POA: Diagnosis not present

## 2018-01-10 DIAGNOSIS — I10 Essential (primary) hypertension: Secondary | ICD-10-CM | POA: Insufficient documentation

## 2018-01-10 DIAGNOSIS — Z79899 Other long term (current) drug therapy: Secondary | ICD-10-CM | POA: Diagnosis not present

## 2018-01-10 LAB — COMPREHENSIVE METABOLIC PANEL
ALT: 22 U/L (ref 0–55)
AST: 21 U/L (ref 5–34)
Albumin: 3.7 g/dL (ref 3.5–5.0)
Alkaline Phosphatase: 49 U/L (ref 40–150)
Anion gap: 8 (ref 3–11)
BUN: 14 mg/dL (ref 7–26)
CALCIUM: 9.2 mg/dL (ref 8.4–10.4)
CHLORIDE: 101 mmol/L (ref 98–109)
CO2: 27 mmol/L (ref 22–29)
Creatinine, Ser: 0.8 mg/dL (ref 0.60–1.10)
Glucose, Bld: 87 mg/dL (ref 70–140)
Potassium: 4 mmol/L (ref 3.5–5.1)
Sodium: 136 mmol/L (ref 136–145)
Total Bilirubin: 0.3 mg/dL (ref 0.2–1.2)
Total Protein: 7.6 g/dL (ref 6.4–8.3)

## 2018-01-10 LAB — CBC WITH DIFFERENTIAL/PLATELET
BASOS PCT: 1 %
Basophils Absolute: 0 10*3/uL (ref 0.0–0.1)
EOS ABS: 0.1 10*3/uL (ref 0.0–0.5)
EOS PCT: 1 %
HEMATOCRIT: 33.5 % — AB (ref 34.8–46.6)
Hemoglobin: 11.2 g/dL — ABNORMAL LOW (ref 11.6–15.9)
LYMPHS ABS: 1.4 10*3/uL (ref 0.9–3.3)
Lymphocytes Relative: 24 %
MCH: 29.4 pg (ref 25.1–34.0)
MCHC: 33.4 g/dL (ref 31.5–36.0)
MCV: 88.2 fL (ref 79.5–101.0)
MONOS PCT: 7 %
Monocytes Absolute: 0.4 10*3/uL (ref 0.1–0.9)
NEUTROS PCT: 67 %
Neutro Abs: 4 10*3/uL (ref 1.5–6.5)
Platelets: 194 10*3/uL (ref 145–400)
RBC: 3.79 MIL/uL (ref 3.70–5.45)
RDW: 15.7 % — AB (ref 11.2–14.5)
WBC: 5.9 10*3/uL (ref 3.9–10.3)

## 2018-01-10 NOTE — Telephone Encounter (Signed)
Gave patient AVs and calendar of upcoming October appointments.  °

## 2018-01-11 ENCOUNTER — Other Ambulatory Visit: Payer: Self-pay | Admitting: Hematology

## 2018-01-24 NOTE — Pre-Procedure Instructions (Signed)
Krystal Brooks  01/24/2018      [id number] - [place], [place] - [address] [place]  [address] Phone: [phone] Fax: [phone]    Your procedure is scheduled on 01/30/2018.  Report to [facility] Admitting at [place].M.  Call this number if you have problems the morning of surgery:  [phone]   Remember:  Nothing to eat or drink after midnight the night before your surgery.  Continue all medications as directed by your physician except follow these medication instructions before surgery below     Take these medicines the morning of surgery with A SIP OF WATER: Acetaminophen (Tylenol) - if needed Albuterol (Ventolin) - if needed (Bring with you to the hospital)  7 days prior to surgery STOP taking any Aspirin (unless otherwise instructed by your surgeon), Aleve, Naproxen, Ibuprofen, Motrin, Advil, Goody's, BC's, all herbal medications, fish oil, and all vitamins   Do not wear jewelry, make-up or nail polish.  Do not wear lotions, powders, or perfumes, or deodorant.  Do not shave 48 hours prior to surgery.   Do not bring valuables to the hospital.  [facility] is not responsible for any belongings or valuables.  Hearing aids, eyeglasses, contacts, dentures or bridgework may not be worn into surgery.  Leave your suitcase in the car.  After surgery it may be brought to your room.  For patients admitted to the hospital, discharge time will be determined by your treatment team.  Patients discharged the day of surgery will not be allowed to drive home.   Name and phone number of your driver:    Special instructions:   [facility]- Preparing For Surgery  Before surgery, you can play an important role. Because skin is not sterile, your skin needs to be as free of germs as possible. You can reduce the number of germs on your skin by washing with CHG (chlorahexidine gluconate) Soap before  surgery.  CHG is an antiseptic cleaner which kills germs and bonds with the skin to continue killing germs even after washing.    Oral Hygiene is also important to reduce your risk of infection.  Remember - BRUSH YOUR TEETH THE MORNING OF SURGERY WITH YOUR REGULAR TOOTHPASTE  Please do not use if you have an allergy to CHG or antibacterial soaps. If your skin becomes reddened/irritated stop using the CHG.  Do not shave (including legs and underarms) for at least 48 hours prior to first CHG shower. It is OK to shave your face.  Please follow these instructions carefully.   1. Shower the NIGHT BEFORE SURGERY and the MORNING OF SURGERY with CHG.   2. If you chose to wash your hair, wash your hair first as usual with your normal shampoo.  3. After you shampoo, rinse your hair and body thoroughly to remove the shampoo.  4. Use CHG as you would any other liquid soap. You can apply CHG directly to the skin and wash gently with a scrungie or a clean washcloth.   5. Apply the CHG Soap to your body ONLY FROM THE NECK DOWN.  Do not use on open wounds or open sores. Avoid contact with your eyes, ears, mouth and genitals (private parts). Wash Face and genitals (private parts)  with your normal soap.  6. Wash thoroughly, paying special attention to the area where your surgery will be performed.  7. Thoroughly rinse your body with  warm water from the neck down.  8. DO NOT shower/wash with your normal soap after using and rinsing off the CHG Soap.  9. Pat yourself dry with a CLEAN TOWEL.  10. Wear CLEAN PAJAMAS to bed the night before surgery, wear comfortable clothes the morning of surgery  11. Place CLEAN SHEETS on your bed the night of your first shower and DO NOT SLEEP WITH PETS.    Day of Surgery: Shower as stated above. Do not apply any deodorants/lotions.  Please wear clean clothes to the hospital/surgery center.   Remember to brush your teeth WITH YOUR REGULAR TOOTHPASTE.    Please  read over the following fact sheets that you were given.

## 2018-01-25 ENCOUNTER — Other Ambulatory Visit: Payer: Self-pay

## 2018-01-25 ENCOUNTER — Ambulatory Visit: Payer: Self-pay | Admitting: Plastic Surgery

## 2018-01-25 ENCOUNTER — Encounter (HOSPITAL_COMMUNITY): Payer: Self-pay

## 2018-01-25 ENCOUNTER — Encounter (HOSPITAL_COMMUNITY)
Admission: RE | Admit: 2018-01-25 | Discharge: 2018-01-25 | Disposition: A | Discharge status: Home / Self Care | Payer: 59 | Source: Ambulatory Visit | Attending: Plastic Surgery | Admitting: Plastic Surgery

## 2018-01-25 DIAGNOSIS — Z9011 Acquired absence of right breast and nipple: Secondary | ICD-10-CM

## 2018-01-25 DIAGNOSIS — Z01812 Encounter for preprocedural laboratory examination: Secondary | ICD-10-CM | POA: Insufficient documentation

## 2018-01-25 DIAGNOSIS — S299XXA Unspecified injury of thorax, initial encounter: Secondary | ICD-10-CM

## 2018-01-25 HISTORY — DX: Cellulitis, unspecified: L03.90

## 2018-01-25 LAB — CBC
HEMATOCRIT: 37.1 % (ref 36.0–46.0)
HEMOGLOBIN: 12 g/dL (ref 12.0–15.0)
MCH: 29.3 pg (ref 26.0–34.0)
MCHC: 32.3 g/dL (ref 30.0–36.0)
MCV: 90.7 fL (ref 78.0–100.0)
Platelets: 181 10*3/uL (ref 150–400)
RBC: 4.09 MIL/uL (ref 3.87–5.11)
RDW: 14.8 % (ref 11.5–15.5)
WBC: 5.1 10*3/uL (ref 4.0–10.5)

## 2018-01-25 LAB — BASIC METABOLIC PANEL
ANION GAP: 9 (ref 5–15)
BUN: 15 mg/dL (ref 6–20)
CALCIUM: 9.5 mg/dL (ref 8.9–10.3)
CHLORIDE: 101 mmol/L (ref 101–111)
CO2: 26 mmol/L (ref 22–32)
CREATININE: 0.81 mg/dL (ref 0.44–1.00)
GFR calc non Af Amer: >60 mL/min (ref >60)
Glucose, Bld: 82 mg/dL (ref 65–99)
Potassium: 3.9 mmol/L (ref 3.5–5.1)
SODIUM: 136 mmol/L (ref 135–145)

## 2018-01-25 NOTE — Progress Notes (Signed)
PCP: Dr. Jenna Luo

## 2018-01-26 ENCOUNTER — Ambulatory Visit: Payer: Self-pay | Admitting: Plastic Surgery

## 2018-01-27 ENCOUNTER — Ambulatory Visit: Payer: Self-pay | Admitting: Plastic Surgery

## 2018-01-27 NOTE — H&P (Signed)
ABBIEGAIL Brooks is an 62 y.o. female.   Chief Complaint: right breast / chest wall wound HPI:The patient is a 62 y.o. yrs old wf here with her husband for pre operative history and physical prior to latissimus dorsi flap closure of her right chest wall.  She underwent bilateral mastectomies and reconstruction.  She had issues with the expander and implants due to the radiation on the right.  She decided to have the implants removed.  She had a wound on the right side that was very resistant to closure.  The skin is very irregular, folded, hard to clean and prone to breakdown.  She is interested in a latissimus flap for purpose of sustained healing and coverage on the ribs.  This is very reasonable.  We will not plan on placing an expander.  Past Medical History:  Diagnosis Date  . Adie's pupil    LEFT EYE  . Allergy   . Asthma   . Atherosclerosis of aorta (HCC)    bulge above renal arteries on mri 2016  . Cancer [facility]) 2017   right breast ca  . Cellulitis    chest  . Complication of anesthesia    VERY HARD TO WAKE UP    . Hypertension   . Personal history of radiation therapy   . PONV (postoperative nausea and vomiting)   . Smoker     Past Surgical History:  Procedure Laterality Date  . ABDOMINAL HYSTERECTOMY    . BREAST BIOPSY Right 01/02/2016  . BREAST EXCISIONAL BIOPSY Right 1979  . BREAST IMPLANT REMOVAL Bilateral 10/05/2017   Procedure: [id number] BILATERAL BREAST IMPLANTS;  Surgeon: Wallace Going, DO;  Location: [place];  Service: Plastics;  Laterality: Bilateral;  . BREAST LUMPECTOMY Right 01/27/2016  . BREAST LUMPECTOMY WITH RADIOACTIVE SEED AND SENTINEL LYMPH NODE BIOPSY Right 01/27/2016   Procedure: BREAST LUMPECTOMY WITH RADIOACTIVE SEED AND SENTINEL LYMPH NODE BIOPSY;  Surgeon: Excell Seltzer, MD;  Location: [place];  Service: General;  Laterality: Right;  . BREAST RECONSTRUCTION WITH PLACEMENT OF TISSUE EXPANDER AND FLEX HD  (ACELLULAR HYDRATED DERMIS) Bilateral 03/16/2017   Procedure: BILATERAL IMMEDIATE BREAST RECONSTRUCTION WITH PLACEMENT OF TISSUE EXPANDER AND FLEX HD (ACELLULAR HYDRATED DERMIS);  Surgeon: Wallace Going, DO;  Location: [place];  Service: Plastics;  Laterality: Bilateral;  . BREAST SURGERY  1979   cyst removed rt breast  . CATARACT EXTRACTION W/ INTRAOCULAR LENS IMPLANT     RT EYE  . CESAREAN SECTION     x2  . MASTECTOMY W/ SENTINEL NODE BIOPSY Bilateral 03/16/2017   Procedure: BILATERAL TOTAL MASTECTOMY WITH LEFT SENTINEL LYMPH NODE BIOPSY;  Surgeon: Excell Seltzer, MD;  Location: [place];  Service: General;  Laterality: Bilateral;  . REMOVAL OF BILATERAL TISSUE EXPANDERS WITH PLACEMENT OF BILATERAL BREAST IMPLANTS Bilateral 06/15/2017   Procedure: REMOVAL OF BILATERAL TISSUE EXPANDERS WITH PLACEMENT OF BILATERAL BREAST IMPLANTS;  Surgeon: Wallace Going, DO;  Location: [place];  Service: Plastics;  Laterality: Bilateral;  . TOTAL MASTECTOMY Bilateral 03/16/2017   SENTINAL NODE BIOPSY    Family History  Problem Relation Age of Onset  . Hearing loss Mother   . Asthma Father   . Cancer Father        melanoma, metastases   . Diabetes Father   . Heart disease Father   . Early death Sister   . Early death Paternal Grandfather   . Cancer Maternal Grandmother 33  colon cancer    Social History:  reports that she has quit smoking. Her smoking use included cigarettes. She has a 20.00 pack-year smoking history. She has never used smokeless tobacco. She reports that she drinks alcohol. She reports that she does not use drugs.  Allergies:  Allergies  Allergen Reactions  . Penicillins Swelling, Rash and Other (See Comments)    Older sister died from reaction to penicillin Has patient had a PCN reaction causing immediate rash, facial/tongue/throat swelling, SOB or lightheadedness with hypotension: Yes Has patient had a PCN reaction causing severe rash  involving mucus membranes or skin necrosis: No Has patient had a PCN reaction that required hospitalization: No Has patient had a PCN reaction occurring within the last 10 years: No If all of the above answers are "NO", then may proceed with Cephalosporin use.   . Sulfa Antibiotics Rash  . Levaquin [Levofloxacin] Nausea And Vomiting    No medications prior to admission.    No results found for this or any previous visit (from the past 48 hour(s)). No results found.  Review of Systems  Constitutional: Negative.   HENT: Negative.   Eyes: Negative.   Respiratory: Negative.   Cardiovascular: Negative.   Gastrointestinal: Negative.   Genitourinary: Negative.   Musculoskeletal: Negative.   Skin: Negative.   Neurological: Negative.   Psychiatric/Behavioral: Negative.     There were no vitals taken for this visit. Physical Exam  Constitutional: She is oriented to person, place, and time. She appears well-developed and well-nourished.  HENT:  Head: Normocephalic and atraumatic.  Eyes: Pupils are equal, round, and reactive to light. EOM are normal.  Cardiovascular: Normal rate.  Respiratory: Effort normal.    GI: Soft.  Neurological: She is alert and oriented to person, place, and time.  Skin: Skin is warm.  Psychiatric: She has a normal mood and affect. Her behavior is normal. Judgment and thought content normal.    Assessment/Plan Plan right latissimus dorsi myocutaneous flap reconstruction of the right chest wall and removal of excess tissue over the left breast.   Los Alvarez, DO 01/27/2018, 2:46 PM

## 2018-01-30 ENCOUNTER — Inpatient Hospital Stay (HOSPITAL_COMMUNITY): Payer: 59 | Admitting: Certified Registered Nurse Anesthetist

## 2018-01-30 ENCOUNTER — Encounter (HOSPITAL_COMMUNITY)
Admission: RE | Disposition: A | Discharge status: Home / Self Care | Payer: Self-pay | Source: Ambulatory Visit | Attending: Plastic Surgery

## 2018-01-30 ENCOUNTER — Encounter (HOSPITAL_COMMUNITY): Payer: Self-pay | Admitting: Plastic Surgery

## 2018-01-30 ENCOUNTER — Inpatient Hospital Stay (HOSPITAL_COMMUNITY)
Admission: RE | Admit: 2018-01-30 | Discharge: 2018-02-01 | DRG: 585 | Disposition: A | Discharge status: Home / Self Care | Payer: 59 | Source: Ambulatory Visit | Attending: Plastic Surgery | Admitting: Plastic Surgery

## 2018-01-30 DIAGNOSIS — Z853 Personal history of malignant neoplasm of breast: Secondary | ICD-10-CM

## 2018-01-30 DIAGNOSIS — Z421 Encounter for breast reconstruction following mastectomy: Secondary | ICD-10-CM | POA: Diagnosis not present

## 2018-01-30 DIAGNOSIS — M954 Acquired deformity of chest and rib: Secondary | ICD-10-CM | POA: Diagnosis not present

## 2018-01-30 DIAGNOSIS — Z87891 Personal history of nicotine dependence: Secondary | ICD-10-CM

## 2018-01-30 DIAGNOSIS — I10 Essential (primary) hypertension: Secondary | ICD-10-CM | POA: Diagnosis not present

## 2018-01-30 DIAGNOSIS — L905 Scar conditions and fibrosis of skin: Principal | ICD-10-CM | POA: Diagnosis present

## 2018-01-30 DIAGNOSIS — J45909 Unspecified asthma, uncomplicated: Secondary | ICD-10-CM | POA: Diagnosis present

## 2018-01-30 DIAGNOSIS — Z923 Personal history of irradiation: Secondary | ICD-10-CM

## 2018-01-30 DIAGNOSIS — Z9013 Acquired absence of bilateral breasts and nipples: Secondary | ICD-10-CM | POA: Diagnosis not present

## 2018-01-30 DIAGNOSIS — Z79899 Other long term (current) drug therapy: Secondary | ICD-10-CM | POA: Diagnosis not present

## 2018-01-30 DIAGNOSIS — S299XXA Unspecified injury of thorax, initial encounter: Secondary | ICD-10-CM

## 2018-01-30 DIAGNOSIS — I2510 Atherosclerotic heart disease of native coronary artery without angina pectoris: Secondary | ICD-10-CM | POA: Diagnosis not present

## 2018-01-30 DIAGNOSIS — Z9011 Acquired absence of right breast and nipple: Secondary | ICD-10-CM

## 2018-01-30 HISTORY — PX: EXCISION OF BREAST LESION: SHX6676

## 2018-01-30 HISTORY — PX: LATISSIMUS FLAP TO BREAST: SHX5357

## 2018-01-30 SURGERY — RECONSTRUCTION, BREAST, USING LATISSIMUS DORSI MYOCUTANEOUS FLAP
Anesthesia: General | Site: Breast | Laterality: Right

## 2018-01-30 MED ORDER — ONDANSETRON HCL 4 MG/2ML IJ SOLN: 4.0000 mg | Freq: Four times a day (QID) | INTRAMUSCULAR | Status: DC | PRN

## 2018-01-30 MED ORDER — PROPOFOL 10 MG/ML IV BOLUS
INTRAVENOUS | Status: DC | PRN
  Administered 2018-01-30: 100 mg via INTRAVENOUS
  Administered 2018-01-30: 30 mg via INTRAVENOUS

## 2018-01-30 MED ORDER — PROPOFOL 10 MG/ML IV BOLUS
INTRAVENOUS | Status: AC
  Filled 2018-01-30: qty 20

## 2018-01-30 MED ORDER — HYDROMORPHONE HCL 2 MG/ML IJ SOLN
INTRAMUSCULAR | Status: AC
  Filled 2018-01-30: qty 1

## 2018-01-30 MED ORDER — HYDROMORPHONE HCL 1 MG/ML IJ SOLN
INTRAMUSCULAR | Status: DC | PRN
  Administered 2018-01-30: 0.5 mg via INTRAVENOUS

## 2018-01-30 MED ORDER — DEXAMETHASONE SODIUM PHOSPHATE 10 MG/ML IJ SOLN
INTRAMUSCULAR | Status: AC
  Filled 2018-01-30: qty 1

## 2018-01-30 MED ORDER — MIDAZOLAM HCL 5 MG/5ML IJ SOLN
INTRAMUSCULAR | Status: DC | PRN
  Administered 2018-01-30: 2 mg via INTRAVENOUS

## 2018-01-30 MED ORDER — SENNA 8.6 MG PO TABS
1.0000 | ORAL_TABLET | Freq: Two times a day (BID) | ORAL | Status: DC
  Administered 2018-01-30 – 2018-01-31 (×3): 8.6 mg via ORAL
  Filled 2018-01-30 (×4): qty 1

## 2018-01-30 MED ORDER — SUGAMMADEX SODIUM 200 MG/2ML IV SOLN
INTRAVENOUS | Status: DC | PRN
  Administered 2018-01-30: 100 mg via INTRAVENOUS

## 2018-01-30 MED ORDER — DEXAMETHASONE SODIUM PHOSPHATE 10 MG/ML IJ SOLN
INTRAMUSCULAR | Status: DC | PRN
  Administered 2018-01-30: 10 mg via INTRAVENOUS

## 2018-01-30 MED ORDER — ONDANSETRON HCL 4 MG/2ML IJ SOLN
INTRAMUSCULAR | Status: DC | PRN
  Administered 2018-01-30: 4 mg via INTRAVENOUS

## 2018-01-30 MED ORDER — HYDROMORPHONE HCL 2 MG/ML IJ SOLN: 0.5000 mg | INTRAMUSCULAR | Status: DC | PRN

## 2018-01-30 MED ORDER — PHENYLEPHRINE 40 MCG/ML (10ML) SYRINGE FOR IV PUSH (FOR BLOOD PRESSURE SUPPORT)
PREFILLED_SYRINGE | INTRAVENOUS | Status: AC
  Filled 2018-01-30: qty 10

## 2018-01-30 MED ORDER — DIPHENHYDRAMINE HCL 50 MG/ML IJ SOLN: 12.5000 mg | Freq: Four times a day (QID) | INTRAMUSCULAR | Status: DC | PRN

## 2018-01-30 MED ORDER — BUPIVACAINE-EPINEPHRINE (PF) 0.25% -1:200000 IJ SOLN
INTRAMUSCULAR | Status: AC
  Filled 2018-01-30: qty 30

## 2018-01-30 MED ORDER — PROMETHAZINE HCL 25 MG/ML IJ SOLN
12.5000 mg | Freq: Four times a day (QID) | INTRAMUSCULAR | Status: DC | PRN
  Filled 2018-01-30: qty 1

## 2018-01-30 MED ORDER — BUPIVACAINE-EPINEPHRINE 0.25% -1:200000 IJ SOLN
INTRAMUSCULAR | Status: DC | PRN
  Administered 2018-01-30: 10 mL
  Administered 2018-01-30: 20 mL

## 2018-01-30 MED ORDER — EVICEL 5 ML EX KIT
PACK | CUTANEOUS | Status: AC
  Filled 2018-01-30: qty 1

## 2018-01-30 MED ORDER — NAPROXEN 250 MG PO TABS: 500.0000 mg | ORAL_TABLET | Freq: Two times a day (BID) | ORAL | Status: DC | PRN

## 2018-01-30 MED ORDER — PHENYLEPHRINE HCL 10 MG/ML IJ SOLN
INTRAMUSCULAR | Status: DC | PRN
  Administered 2018-01-30: 120 ug via INTRAVENOUS
  Administered 2018-01-30: 160 ug via INTRAVENOUS
  Administered 2018-01-30: 120 ug via INTRAVENOUS

## 2018-01-30 MED ORDER — FENTANYL CITRATE (PF) 250 MCG/5ML IJ SOLN
INTRAMUSCULAR | Status: AC
  Filled 2018-01-30: qty 5

## 2018-01-30 MED ORDER — LIDOCAINE HCL (CARDIAC) PF 100 MG/5ML IV SOSY
PREFILLED_SYRINGE | INTRAVENOUS | Status: DC | PRN
  Administered 2018-01-30: 100 mg via INTRAVENOUS

## 2018-01-30 MED ORDER — PHENYLEPHRINE HCL 10 MG/ML IJ SOLN
INTRAVENOUS | Status: DC | PRN
  Administered 2018-01-30: 20 ug/min via INTRAVENOUS

## 2018-01-30 MED ORDER — SODIUM CHLORIDE 0.9 % IV SOLN
INTRAVENOUS | Status: DC | PRN
  Administered 2018-01-30: 500 mL

## 2018-01-30 MED ORDER — LACTATED RINGERS IV SOLN
INTRAVENOUS | Status: DC | PRN
  Administered 2018-01-30 (×2): via INTRAVENOUS

## 2018-01-30 MED ORDER — MIDAZOLAM HCL 2 MG/2ML IJ SOLN
INTRAMUSCULAR | Status: AC
  Filled 2018-01-30: qty 2

## 2018-01-30 MED ORDER — ROCURONIUM BROMIDE 100 MG/10ML IV SOLN
INTRAVENOUS | Status: DC | PRN
  Administered 2018-01-30: 20 mg via INTRAVENOUS
  Administered 2018-01-30: 50 mg via INTRAVENOUS

## 2018-01-30 MED ORDER — EVICEL 5 ML EX KIT
PACK | CUTANEOUS | Status: DC | PRN
  Administered 2018-01-30: 5 mL

## 2018-01-30 MED ORDER — ONDANSETRON HCL 4 MG/2ML IJ SOLN: 4.0000 mg | Freq: Once | INTRAMUSCULAR | Status: DC | PRN

## 2018-01-30 MED ORDER — KCL IN DEXTROSE-NACL 10-5-0.45 MEQ/L-%-% IV SOLN
INTRAVENOUS | Status: DC
  Administered 2018-01-30 – 2018-01-31 (×2): via INTRAVENOUS
  Filled 2018-01-30 (×3): qty 1000

## 2018-01-30 MED ORDER — CIPROFLOXACIN IN D5W 400 MG/200ML IV SOLN
INTRAVENOUS | Status: AC
  Filled 2018-01-30: qty 200

## 2018-01-30 MED ORDER — HYDROCODONE-ACETAMINOPHEN 5-325 MG PO TABS
1.0000 | ORAL_TABLET | ORAL | Status: DC | PRN
  Administered 2018-01-30 – 2018-02-01 (×7): 2 via ORAL
  Filled 2018-01-30 (×7): qty 2

## 2018-01-30 MED ORDER — SUGAMMADEX SODIUM 200 MG/2ML IV SOLN
INTRAVENOUS | Status: AC
  Filled 2018-01-30: qty 2

## 2018-01-30 MED ORDER — SCOPOLAMINE 1 MG/3DAYS TD PT72
MEDICATED_PATCH | TRANSDERMAL | Status: AC
  Administered 2018-01-30: 1.5 mg
  Filled 2018-01-30: qty 1

## 2018-01-30 MED ORDER — HYDROMORPHONE HCL 2 MG/ML IJ SOLN
0.2500 mg | INTRAMUSCULAR | Status: DC | PRN
  Administered 2018-01-30 (×2): 0.5 mg via INTRAVENOUS

## 2018-01-30 MED ORDER — HYDROMORPHONE HCL 1 MG/ML IJ SOLN
INTRAMUSCULAR | Status: AC
  Filled 2018-01-30: qty 0.5

## 2018-01-30 MED ORDER — 0.9 % SODIUM CHLORIDE (POUR BTL) OPTIME
TOPICAL | Status: DC | PRN
  Administered 2018-01-30 (×2): 1000 mL

## 2018-01-30 MED ORDER — ZOLPIDEM TARTRATE 5 MG PO TABS: 5.0000 mg | ORAL_TABLET | Freq: Every evening | ORAL | Status: DC | PRN

## 2018-01-30 MED ORDER — CIPROFLOXACIN IN D5W 400 MG/200ML IV SOLN
400.0000 mg | INTRAVENOUS | Status: AC
  Administered 2018-01-30: 400 mg via INTRAVENOUS

## 2018-01-30 MED ORDER — PROMETHAZINE HCL 25 MG/ML IJ SOLN
INTRAMUSCULAR | Status: DC | PRN
  Administered 2018-01-30: 12.5 mg via INTRAVENOUS

## 2018-01-30 MED ORDER — DIAZEPAM 2 MG PO TABS
2.0000 mg | ORAL_TABLET | Freq: Two times a day (BID) | ORAL | Status: DC | PRN
  Administered 2018-01-30 – 2018-01-31 (×2): 2 mg via ORAL
  Filled 2018-01-30 (×2): qty 1

## 2018-01-30 MED ORDER — FENTANYL CITRATE (PF) 100 MCG/2ML IJ SOLN
INTRAMUSCULAR | Status: DC | PRN
  Administered 2018-01-30: 150 ug via INTRAVENOUS
  Administered 2018-01-30: 100 ug via INTRAVENOUS

## 2018-01-30 MED ORDER — MEPERIDINE HCL 50 MG/ML IJ SOLN: 6.2500 mg | INTRAMUSCULAR | Status: DC | PRN

## 2018-01-30 MED ORDER — EPHEDRINE SULFATE 50 MG/ML IJ SOLN
INTRAMUSCULAR | Status: DC | PRN
  Administered 2018-01-30 (×2): 5 mg via INTRAVENOUS

## 2018-01-30 MED ORDER — ROCURONIUM BROMIDE 10 MG/ML (PF) SYRINGE
PREFILLED_SYRINGE | INTRAVENOUS | Status: AC
  Filled 2018-01-30: qty 5

## 2018-01-30 MED ORDER — CIPROFLOXACIN IN D5W 400 MG/200ML IV SOLN
400.0000 mg | Freq: Two times a day (BID) | INTRAVENOUS | Status: DC
  Administered 2018-01-30 – 2018-01-31 (×3): 400 mg via INTRAVENOUS
  Filled 2018-01-30 (×4): qty 200

## 2018-01-30 MED ORDER — ONDANSETRON 4 MG PO TBDP: 4.0000 mg | ORAL_TABLET | Freq: Four times a day (QID) | ORAL | Status: DC | PRN

## 2018-01-30 MED ORDER — ONDANSETRON HCL 4 MG/2ML IJ SOLN
INTRAMUSCULAR | Status: AC
  Filled 2018-01-30: qty 2

## 2018-01-30 MED ORDER — SUCCINYLCHOLINE CHLORIDE 200 MG/10ML IV SOSY
PREFILLED_SYRINGE | INTRAVENOUS | Status: AC
  Filled 2018-01-30: qty 10

## 2018-01-30 MED ORDER — DIPHENHYDRAMINE HCL 12.5 MG/5ML PO ELIX: 12.5000 mg | ORAL_SOLUTION | Freq: Four times a day (QID) | ORAL | Status: DC | PRN

## 2018-01-30 MED ORDER — ACETAMINOPHEN 500 MG PO TABS
500.0000 mg | ORAL_TABLET | Freq: Four times a day (QID) | ORAL | Status: DC
  Administered 2018-01-31 – 2018-02-01 (×2): 500 mg via ORAL
  Filled 2018-01-30 (×2): qty 1

## 2018-01-30 MED ORDER — SODIUM CHLORIDE 0.9 % IV SOLN
INTRAVENOUS | Status: AC
  Filled 2018-01-30: qty 500000

## 2018-01-30 SURGICAL SUPPLY — 66 items
ADH SKN CLS APL DERMABOND .7 (GAUZE/BANDAGES/DRESSINGS) ×4
APPLIER CLIP 9.375 MED OPEN (MISCELLANEOUS) ×3
APR CLP MED 9.3 20 MLT OPN (MISCELLANEOUS) ×2
BAG DECANTER FOR FLEXI CONT (MISCELLANEOUS) ×1 IMPLANT
BINDER BREAST LRG (GAUZE/BANDAGES/DRESSINGS) ×1 IMPLANT
BIOPATCH RED 1 DISK 7.0 (GAUZE/BANDAGES/DRESSINGS) ×7 IMPLANT
BLADE SURG 10 STRL SS (BLADE) ×4 IMPLANT
BLADE SURG 15 STRL LF DISP TIS (BLADE) ×2 IMPLANT
BLADE SURG 15 STRL SS (BLADE) ×3
CANISTER SUCT 3000ML PPV (MISCELLANEOUS) ×3 IMPLANT
CHLORAPREP W/TINT 26ML (MISCELLANEOUS) ×3 IMPLANT
CLIP APPLIE 9.375 MED OPEN (MISCELLANEOUS) ×2 IMPLANT
COVER SURGICAL LIGHT HANDLE (MISCELLANEOUS) ×4 IMPLANT
DECANTER SPIKE VIAL GLASS SM (MISCELLANEOUS) ×3 IMPLANT
DERMABOND ADVANCED (GAUZE/BANDAGES/DRESSINGS) ×2
DERMABOND ADVANCED .7 DNX12 (GAUZE/BANDAGES/DRESSINGS) ×2 IMPLANT
DRAIN CHANNEL 19F RND (DRAIN) ×7 IMPLANT
DRAPE HALF SHEET 40X57 (DRAPES) ×6 IMPLANT
DRAPE INCISE 23X17 IOBAN STRL (DRAPES)
DRAPE INCISE 23X17 STRL (DRAPES) IMPLANT
DRAPE INCISE IOBAN 23X17 STRL (DRAPES) IMPLANT
DRAPE INCISE IOBAN 85X60 (DRAPES) IMPLANT
DRAPE ORTHO SPLIT 77X108 STRL (DRAPES) ×6
DRAPE SURG ORHT 6 SPLT 77X108 (DRAPES) ×4 IMPLANT
DRAPE UTILITY XL STRL (DRAPES) ×2 IMPLANT
DRAPE WARM FLUID 44X44 (DRAPE) ×3 IMPLANT
DRSG MEPILEX BORDER 4X8 (GAUZE/BANDAGES/DRESSINGS) ×3 IMPLANT
DRSG PAD ABDOMINAL 8X10 ST (GAUZE/BANDAGES/DRESSINGS) ×6 IMPLANT
ELECT BLADE 6.5 EXT (BLADE) ×1 IMPLANT
ELECT CAUTERY BLADE 6.4 (BLADE) ×9 IMPLANT
ELECT REM PT RETURN 9FT ADLT (ELECTROSURGICAL) ×3
ELECTRODE REM PT RTRN 9FT ADLT (ELECTROSURGICAL) ×2 IMPLANT
EVACUATOR SILICONE 100CC (DRAIN) ×7 IMPLANT
GAUZE SPONGE 4X4 12PLY STRL (GAUZE/BANDAGES/DRESSINGS) ×6 IMPLANT
GLOVE BIO SURGEON STRL SZ 6.5 (GLOVE) ×7 IMPLANT
GLOVE BIO SURGEON STRL SZ7 (GLOVE) ×2 IMPLANT
GLOVE BIO SURGEON STRL SZ7.5 (GLOVE) ×2 IMPLANT
GLOVE BIOGEL PI IND STRL 7.0 (GLOVE) IMPLANT
GLOVE BIOGEL PI IND STRL 7.5 (GLOVE) IMPLANT
GLOVE BIOGEL PI INDICATOR 7.0 (GLOVE) ×1
GLOVE BIOGEL PI INDICATOR 7.5 (GLOVE) ×2
GOWN STRL REUS W/ TWL LRG LVL3 (GOWN DISPOSABLE) ×8 IMPLANT
GOWN STRL REUS W/TWL LRG LVL3 (GOWN DISPOSABLE) ×12
KIT BASIN OR (CUSTOM PROCEDURE TRAY) ×3 IMPLANT
NEEDLE 22X1 1/2 (OR ONLY) (NEEDLE) ×3 IMPLANT
NS IRRIG 1000ML POUR BTL (IV SOLUTION) ×6 IMPLANT
PACK GENERAL/GYN (CUSTOM PROCEDURE TRAY) ×3 IMPLANT
PAD ABD 8X10 STRL (GAUZE/BANDAGES/DRESSINGS) ×2 IMPLANT
PAD ARMBOARD 7.5X6 YLW CONV (MISCELLANEOUS) ×9 IMPLANT
PENCIL BUTTON HOLSTER BLD 10FT (ELECTRODE) ×3 IMPLANT
SPONGE LAP 18X18 X RAY DECT (DISPOSABLE) IMPLANT
STAPLER VISISTAT 35W (STAPLE) ×3 IMPLANT
SUT ETHILON 2 0 FS 18 (SUTURE) ×7 IMPLANT
SUT MNCRL AB 3-0 PS2 18 (SUTURE) ×2 IMPLANT
SUT MNCRL AB 4-0 PS2 18 (SUTURE) ×9 IMPLANT
SUT MON AB 5-0 PS2 18 (SUTURE) ×8 IMPLANT
SUT VIC AB 3-0 PS2 18 (SUTURE) ×6
SUT VIC AB 3-0 PS2 18XBRD (SUTURE) ×8 IMPLANT
SUT VIC AB 3-0 SH 18 (SUTURE) ×1 IMPLANT
SUT VIC AB 3-0 SH 8-18 (SUTURE) ×3 IMPLANT
SYR BULB IRRIGATION 50ML (SYRINGE) ×3 IMPLANT
SYR CONTROL 10ML LL (SYRINGE) ×3 IMPLANT
TOWEL GREEN STERILE (TOWEL DISPOSABLE) ×3 IMPLANT
TOWEL GREEN STERILE FF (TOWEL DISPOSABLE) ×3 IMPLANT
TUBE CONNECTING 12X1/4 (SUCTIONS) ×3 IMPLANT
YANKAUER SUCT BULB TIP NO VENT (SUCTIONS) ×3 IMPLANT

## 2018-01-30 NOTE — Interval H&P Note (Signed)
History and Physical Interval Note:  01/30/2018 7:04 AM  Krystal Brooks  has presented today for surgery, with the diagnosis of Acquired absence of bilateral breasts and nipples, History of breast cancer in female  The various methods of treatment have been discussed with the patient and family. After consideration of risks, benefits and other options for treatment, the patient has consented to  Procedure(s): RIGHT LATISSIMUS MYOCUTANEOUS FLAP FOR RIGHT CHEST WALL RECONSTRUCTION (Right) as a surgical intervention .  The patient's history has been reviewed, patient examined, no change in status, stable for surgery.  I have reviewed the patient's chart and labs.  Questions were answered to the patient's satisfaction.     [name] [name]

## 2018-01-30 NOTE — Op Note (Signed)
DATE OF OPERATION: 01/30/2018  LOCATION: [facility]  PREOPERATIVE DIAGNOSIS: Breast Cancer s/p right mastectomy, chest wall defect  POSTOPERATIVE DIAGNOSIS: Same  PROCEDURE: 1. Latissimus myocutaneous flap  to reconstruct the right chest 2. Excision of capsule contracture and scar of the right breast 3 x 7 cm 3. Excision of scar contracture of left breast 3 x 8 cm  SURGEON: Ark Agrusa Sanger Johnella Crumm, DO  EBL: 100 cc  SPECIMEN: None  DRAINS: Three total 60 blake round drains  CONDITION: Stable  COMPLICATIONS: None  INDICATION: The patient, Krystal Brooks, is a 62 y.o. female born on October 16, 1955, is here for treatment after a mastectomy.    PROCEDURE DETAILS:  The patient was seen on the morning of her surgery and marked out for her flap.  She was given an IV and IV antibiotics. She was then taken to the operating room and given a general anesthetic. A standard time out was performed and all information was confirmed by those in the room. She had SCD's placed. She was placed into the left lateral decubitus position with all key points padded. She was then prepped and draped in the standard sterile fashion using a chloroprep. The paddle design and position was confirmed. The procedure began by incising the margins of the paddle and dissecting out until all four margins of the muscle were identified. The muscle was then released inferiorly and anteriorly and care was taken not to pick up the serratus anteriorly or the paraspinous muscles posteriorly. The flap was then raised to the scapula and released and rotated medially. Care was taken to protect the vascular pedicle throughout this portion of the procedure. The paddle and muscle looked healthy throughout the case.  The old mastectomy scar was excised and the flaps on top of the pectoralis muscle were raised.  The posterior and anterior pockets were then connected in the plane above the muscle. The muscle from the back and  the skin paddle were then rotated into the chest pocket. The flap pedicle was inspected and there was no tension. The back pocket was hemostased and Evicel placed. Two #19 blake round drains were place and secured with 3-0 Silk. The back incision was closed with buried 3-0 Vicryl, followed by 4-0 Monocryl and 5-0 Monocryl.  Dermabond and a protective dressing was applied.   The patient was then repositioned onto her back and the chest was prepped and draped. The #10 blade was used to excise the 3 x 7 cm skin that was deeply scared on the right breast at the mastectomy incision.  The bovie was used to release the flaps superiorly and inferiorly from the pectoralis muscle.  This will help decrease the tension with closure. The breast pocket was inspected and hemostases was achieved with electrocautery. The latissimus was then rotated into position.  The muscle was then secured superiorly to the pectoralis muscle with 3-0 Vicryl. The inferior portion of the muscle was tacked to the inframammary fold with 3-0 Vicryl.  One drain was placed on this side and secured with 3-0 Nylon. The flap was then closed with 3-0 Monocryl deep, followed by 4-0 Monocryl and the skin closed with 5-0 Monocryl.    The left lateral excess skin and scar contracture was marked.  Local was injected for intraoperative hemostasis and postoperative pain control.  The #10 blade was used to excise the 3 x 8 cm area of scar, fat and skin.  The bovie was used to obtain hemostasis.  The deep layers  were closed with the 3-0 Monocryl followed by the 4-0 and 5-0 Monocryl.  Dermabond, ABDs and a breast binder was applied.    The patient was allowed to wake up and taken to recovery room in stable condition at the end of the case. The family was notified at the end of the case.

## 2018-01-30 NOTE — Anesthesia Postprocedure Evaluation (Signed)
Anesthesia Post Note  Patient: Krystal Brooks  Procedure(s) Performed: RIGHT LATISSIMUS MYOCUTANEOUS FLAP FOR RIGHT CHEST WALL RECONSTRUCTION (Right Breast) EXCISION OF LEFT BREAST SCAR (Left Breast)     Patient location during evaluation: PACU Anesthesia Type: General Level of consciousness: awake and alert Pain management: pain level controlled Vital Signs Assessment: post-procedure vital signs reviewed and stable Respiratory status: spontaneous breathing, nonlabored ventilation, respiratory function stable and patient connected to nasal cannula oxygen Cardiovascular status: blood pressure returned to baseline and stable Postop Assessment: no apparent nausea or vomiting Anesthetic complications: no    Last Vitals:  Vitals:   01/30/18 1227 01/30/18 1252  BP: (!) 114/94 (!) 155/86  Pulse: 81 69  Resp: 20 16  Temp: (!) 36.4 C 36.4 C  SpO2: 95% 99%    Last Pain:  Vitals:   01/30/18 1311  TempSrc:   PainSc: 0-No pain                 Niguel Moure DAVID

## 2018-01-30 NOTE — Anesthesia Procedure Notes (Signed)
Procedure Name: Intubation Date/Time: 01/30/2018 7:30 AM Performed by: Shirlyn Goltz, CRNA Pre-anesthesia Checklist: Patient identified, Emergency Drugs available, Suction available and Patient being monitored Patient Re-evaluated:Patient Re-evaluated prior to induction Oxygen Delivery Method: Circle system utilized Preoxygenation: Pre-oxygenation with 100% oxygen Induction Type: IV induction Ventilation: Mask ventilation without difficulty Laryngoscope Size: Mac and 4 Grade View: Grade I Tube type: Oral Tube size: 7.0 mm Number of attempts: 1 Airway Equipment and Method: Stylet Placement Confirmation: ETT inserted through vocal cords under direct vision,  positive ETCO2 and breath sounds checked- equal and bilateral Secured at: 20 cm Dental Injury: Teeth and Oropharynx as per pre-operative assessment

## 2018-01-30 NOTE — Anesthesia Preprocedure Evaluation (Signed)
Anesthesia Evaluation  Patient identified by MRN, date of birth, ID band Patient awake    Reviewed: Allergy & Precautions, NPO status , Patient's Chart, lab work & pertinent test results  History of Anesthesia Complications (+) PONV  Airway Mallampati: I  TM Distance: >3 FB Neck ROM: Full    Dental   Pulmonary former smoker,    Pulmonary exam normal        Cardiovascular hypertension, Pt. on medications Normal cardiovascular exam     Neuro/Psych    GI/Hepatic   Endo/Other    Renal/GU      Musculoskeletal   Abdominal   Peds  Hematology   Anesthesia Other Findings   Reproductive/Obstetrics                             Anesthesia Physical Anesthesia Plan  ASA: II  Anesthesia Plan: General   Post-op Pain Management:    Induction: Intravenous  PONV Risk Score and Plan: 4 or greater and Ondansetron, Dexamethasone, Midazolam, Scopolamine patch - Pre-op and Promethazine  Airway Management Planned: Oral ETT  Additional Equipment:   Intra-op Plan:   Post-operative Plan: Extubation in OR  Informed Consent: I have reviewed the patients History and Physical, chart, labs and discussed the procedure including the risks, benefits and alternatives for the proposed anesthesia with the patient or authorized representative who has indicated his/her understanding and acceptance.     Plan Discussed with: CRNA and Surgeon  Anesthesia Plan Comments:         Anesthesia Quick Evaluation

## 2018-01-30 NOTE — Transfer of Care (Signed)
Immediate Anesthesia Transfer of Care Note  Patient: Krystal Brooks  Procedure(s) Performed: RIGHT LATISSIMUS MYOCUTANEOUS FLAP FOR RIGHT CHEST WALL RECONSTRUCTION (Right Breast) EXCISION OF LEFT BREAST SCAR (Left Breast)  Patient Location: PACU  Anesthesia Type:General  Level of Consciousness: drowsy and patient cooperative  Airway & Oxygen Therapy: Patient Spontanous Breathing and Patient connected to nasal cannula oxygen  Post-op Assessment: Report given to RN and Post -op Vital signs reviewed and stable  Post vital signs: Reviewed and stable  Last Vitals:  Vitals Value Taken Time  BP 153/93 01/30/2018 10:27 AM  Temp    Pulse 81 01/30/2018 10:28 AM  Resp 16 01/30/2018 10:28 AM  SpO2 99 % 01/30/2018 10:28 AM  Vitals shown include unvalidated device data.  Last Pain:  Vitals:   01/30/18 0556  TempSrc: Oral         Complications: No apparent anesthesia complications

## 2018-01-31 ENCOUNTER — Other Ambulatory Visit: Payer: Self-pay

## 2018-01-31 ENCOUNTER — Encounter (HOSPITAL_COMMUNITY): Payer: Self-pay | Admitting: Plastic Surgery

## 2018-01-31 LAB — CBC
HEMATOCRIT: 29.3 % — AB (ref 36.0–46.0)
Hemoglobin: 9.3 g/dL — ABNORMAL LOW (ref 12.0–15.0)
MCH: 29.5 pg (ref 26.0–34.0)
MCHC: 31.7 g/dL (ref 30.0–36.0)
MCV: 93 fL (ref 78.0–100.0)
PLATELETS: 133 10*3/uL — AB (ref 150–400)
RBC: 3.15 MIL/uL — ABNORMAL LOW (ref 3.87–5.11)
RDW: 14.8 % (ref 11.5–15.5)
WBC: 12.5 10*3/uL — ABNORMAL HIGH (ref 4.0–10.5)

## 2018-01-31 LAB — BASIC METABOLIC PANEL
Anion gap: 13 (ref 5–15)
BUN: 13 mg/dL (ref 6–20)
CO2: 21 mmol/L — ABNORMAL LOW (ref 22–32)
CREATININE: 0.82 mg/dL (ref 0.44–1.00)
Calcium: 8.3 mg/dL — ABNORMAL LOW (ref 8.9–10.3)
Chloride: 99 mmol/L — ABNORMAL LOW (ref 101–111)
GFR calc Af Amer: >60 mL/min (ref >60)
Glucose, Bld: 126 mg/dL — ABNORMAL HIGH (ref 65–99)
POTASSIUM: 4 mmol/L (ref 3.5–5.1)
SODIUM: 133 mmol/L — AB (ref 135–145)

## 2018-01-31 NOTE — Plan of Care (Signed)
  Problem: Education: Goal: Knowledge of General Education information will improve Outcome: Progressing   Problem: Health Behavior/Discharge Planning: Goal: Ability to manage health-related needs will improve Outcome: Progressing   Problem: Clinical Measurements: Goal: Ability to maintain clinical measurements within normal limits will improve Outcome: Progressing   Problem: Skin Integrity: Goal: Risk for impaired skin integrity will decrease Outcome: Progressing

## 2018-02-01 MED ORDER — HYDROCODONE-ACETAMINOPHEN 5-325 MG PO TABS: 1.0000 | ORAL_TABLET | ORAL | 0 refills | Status: DC | PRN

## 2018-02-01 MED ORDER — DIPHENHYDRAMINE HCL 12.5 MG/5ML PO ELIX: 12.5000 mg | ORAL_SOLUTION | Freq: Four times a day (QID) | ORAL | 0 refills | Status: DC | PRN

## 2018-02-01 NOTE — Progress Notes (Signed)
Provided discharge education/instructions, all questions and concerns addressed, Pt not in distress, discharged home with belongings accompanied by husband. 

## 2018-02-01 NOTE — Progress Notes (Signed)
Subjective: Post operative day 1, in room and doing well.  Pain is well controlled with meds.  She has been up and walking.  Not ready for discharge yet due to pain.  Objective: Vital signs in last 24 hours: Temp:  [98 F (36.7 C)-98.5 F (36.9 C)] 98 F (36.7 C) (06/05 0648) Pulse Rate:  [71-76] 72 (06/05 0648) Resp:  [16-18] 18 (06/05 0648) BP: (142-166)/(61-73) 166/65 (06/05 0648) SpO2:  [94 %-96 %] 94 % (06/05 0648) Weight change:  Last BM Date: 01/30/18  Intake/Output from previous day: 06/04 0701 - 06/05 0700 In: 1697.5 [P.O.:660; I.V.:637.5; IV Piggyback:400] Out: 210 [Drains:210] Intake/Output this shift: No intake/output data recorded.  General appearance: alert, cooperative and no distress Breasts: normal appearance, no masses or tenderness Skin: Skin color, texture, turgor normal. No rashes or lesions, no sign of hematoma.  Drain output as expected.  Lab Results: Recent Labs    01/31/18 0415  WBC 12.5*  HGB 9.3*  HCT 29.3*  PLT 133*   BMET Recent Labs    01/31/18 0415  NA 133*  K 4.0  CL 99*  CO2 21*  GLUCOSE 126*  BUN 13  CREATININE 0.82  CALCIUM 8.3*    Studies/Results: No results found.  Medications: I have reviewed the patient's current medications.  Assessment/Plan: Plan for discharge tomorrow if pain well controlled.  Up and walking more.  LOS: 2 days    Krystal Brooks 02/01/2018

## 2018-02-01 NOTE — Plan of Care (Signed)

## 2018-02-01 NOTE — Discharge Summary (Signed)
Physician Discharge Summary  Patient ID: Krystal Brooks MRN: [id number] DOB/AGE: February 21, 1956 62 y.o.  Admit date: 01/30/2018 Discharge date: 02/01/2018  Admission Diagnoses:  Discharge Diagnoses:  Active Problems:   Acquired absence of right breast   Discharged Condition: good  Hospital Course: The patient was taken to the operating room and underwent a right latissimus myocutaneous muscle flap for right chest wall reconstruction.  She did very well and was managed on the surgery unit.  She was walking, eating and her pain was well controlled at discharge.  She is pleased with her progress.  All sites intact and no sign of hematoma.  Drains working well.  Consults: None  Significant Diagnostic Studies: none  Treatments: surgery  Discharge Exam: Blood pressure (!) 166/65, pulse 72, temperature 98 F (36.7 C), temperature source Oral, resp. rate 18, height 5\' 1"  (1.549 m), weight 57.2 kg (126 lb), SpO2 94 %. General appearance: no distress Breasts: normal appearance, no masses or tenderness Incision/Wound:  Disposition: Discharge disposition: 01-Home or Self Care       Discharge Instructions    Call MD for:  difficulty breathing, headache or visual disturbances   Complete by:  As directed    Call MD for:  persistant nausea and vomiting   Complete by:  As directed    Call MD for:  redness, tenderness, or signs of infection (pain, swelling, redness, odor or green/yellow discharge around incision site)   Complete by:  As directed    Call MD for:  severe uncontrolled pain   Complete by:  As directed    Call MD for:  temperature >100.4   Complete by:  As directed    Diet general   Complete by:  As directed    Discharge wound care:   Complete by:  As directed    Drain care   Driving Restrictions   Complete by:  As directed    No driving while on pain meds.   Increase activity slowly   Complete by:  As directed    Lifting restrictions   Complete by:  As directed    May  move arms but no heavy lifting.      Follow-up Information    Dillingham, [name], DO In 1 week.   Specialty:  Plastic Surgery Contact information: [place] [address] [phone]           Signed: Wallace Going 02/01/2018, 7:55 AM

## 2018-02-10 ENCOUNTER — Other Ambulatory Visit: Payer: Self-pay | Admitting: Family Medicine

## 2018-02-20 DIAGNOSIS — C50911 Malignant neoplasm of unspecified site of right female breast: Secondary | ICD-10-CM | POA: Diagnosis not present

## 2018-02-20 DIAGNOSIS — C50912 Malignant neoplasm of unspecified site of left female breast: Secondary | ICD-10-CM | POA: Diagnosis not present

## 2018-04-05 DIAGNOSIS — H25812 Combined forms of age-related cataract, left eye: Secondary | ICD-10-CM | POA: Diagnosis not present

## 2018-04-05 DIAGNOSIS — H26491 Other secondary cataract, right eye: Secondary | ICD-10-CM | POA: Diagnosis not present

## 2018-04-05 DIAGNOSIS — Z961 Presence of intraocular lens: Secondary | ICD-10-CM | POA: Diagnosis not present

## 2018-04-07 ENCOUNTER — Encounter: Payer: Self-pay | Admitting: Family Medicine

## 2018-04-07 ENCOUNTER — Ambulatory Visit (INDEPENDENT_AMBULATORY_CARE_PROVIDER_SITE_OTHER): Payer: 59 | Admitting: Family Medicine

## 2018-04-07 VITALS — BP 142/70 | HR 80 | Temp 97.6°F | Resp 14 | Ht 61.0 in | Wt 128.0 lb

## 2018-04-07 DIAGNOSIS — H60331 Swimmer's ear, right ear: Secondary | ICD-10-CM | POA: Diagnosis not present

## 2018-04-07 MED ORDER — NEOMYCIN-POLYMYXIN-HC 3.5-10000-1 OT SOLN: 4.0000 [drp] | Freq: Four times a day (QID) | OTIC | 1 refills | Status: DC

## 2018-04-07 NOTE — Progress Notes (Signed)
Subjective:    Patient ID: Krystal Brooks, female    DOB: 1955/12/17, 62 y.o.   MRN: [id number]  HPI  Patient presents with pain and itching in her right ear for several days.  The right ear canal is swollen and erythematous.  It itches down inside.  Every morning there is drainage coming from her right ear onto her pillow.  The pain is mild.  She denies any fever. Past Medical History:  Diagnosis Date  . Adie's pupil    LEFT EYE  . Allergy   . Asthma   . Atherosclerosis of aorta (HCC)    bulge above renal arteries on mri 2016  . Cancer [facility]) 2017   right breast ca  . Cellulitis    chest  . Complication of anesthesia    VERY HARD TO WAKE UP    . Hypertension   . Personal history of radiation therapy   . PONV (postoperative nausea and vomiting)   . Smoker    Past Surgical History:  Procedure Laterality Date  . ABDOMINAL HYSTERECTOMY    . BREAST BIOPSY Right 01/02/2016  . BREAST EXCISIONAL BIOPSY Right 1979  . BREAST IMPLANT REMOVAL Bilateral 10/05/2017   Procedure: [id number] BILATERAL BREAST IMPLANTS;  Surgeon: Wallace Going, DO;  Location: [place];  Service: Plastics;  Laterality: Bilateral;  . BREAST LUMPECTOMY Right 01/27/2016  . BREAST LUMPECTOMY WITH RADIOACTIVE SEED AND SENTINEL LYMPH NODE BIOPSY Right 01/27/2016   Procedure: BREAST LUMPECTOMY WITH RADIOACTIVE SEED AND SENTINEL LYMPH NODE BIOPSY;  Surgeon: Excell Seltzer, MD;  Location: [place];  Service: General;  Laterality: Right;  . BREAST RECONSTRUCTION WITH PLACEMENT OF TISSUE EXPANDER AND FLEX HD (ACELLULAR HYDRATED DERMIS) Bilateral 03/16/2017   Procedure: BILATERAL IMMEDIATE BREAST RECONSTRUCTION WITH PLACEMENT OF TISSUE EXPANDER AND FLEX HD (ACELLULAR HYDRATED DERMIS);  Surgeon: Wallace Going, DO;  Location: [place];  Service: Plastics;  Laterality: Bilateral;  . BREAST SURGERY  1979   cyst removed rt breast  . CATARACT EXTRACTION W/ INTRAOCULAR LENS IMPLANT     RT EYE  . CESAREAN SECTION     x2  . EXCISION OF BREAST LESION Left 01/30/2018   Procedure: EXCISION OF LEFT BREAST SCAR;  Surgeon: Wallace Going, DO;  Location: [place];  Service: Plastics;  Laterality: Left;  . LATISSIMUS FLAP TO BREAST Right 01/30/2018   Procedure: RIGHT LATISSIMUS MYOCUTANEOUS FLAP FOR RIGHT CHEST WALL RECONSTRUCTION;  Surgeon: Wallace Going, DO;  Location: [place];  Service: Plastics;  Laterality: Right;  . MASTECTOMY W/ SENTINEL NODE BIOPSY Bilateral 03/16/2017   Procedure: BILATERAL TOTAL MASTECTOMY WITH LEFT SENTINEL LYMPH NODE BIOPSY;  Surgeon: Excell Seltzer, MD;  Location: [place];  Service: General;  Laterality: Bilateral;  . REMOVAL OF BILATERAL TISSUE EXPANDERS WITH PLACEMENT OF BILATERAL BREAST IMPLANTS Bilateral 06/15/2017   Procedure: REMOVAL OF BILATERAL TISSUE EXPANDERS WITH PLACEMENT OF BILATERAL BREAST IMPLANTS;  Surgeon: Wallace Going, DO;  Location: [place];  Service: Plastics;  Laterality: Bilateral;  . TOTAL MASTECTOMY Bilateral 03/16/2017   SENTINAL NODE BIOPSY   Current Outpatient Medications on File Prior to Visit  Medication Sig Dispense Refill  . albuterol (VENTOLIN HFA) 108 (90 Base) MCG/ACT inhaler USE 1-2 PUFFS EVERY 6 HOURS AS NEEDED FOR WHEEZING 18 g 5  . ANORO ELLIPTA 62.5-25 MCG/INH AEPB INHALE 1 PUFF INTO THE LUNGS DAILY (Patient taking differently: Inhale 1 puff into the lungs at bedtime. ) 60 each 10  . Ascorbic Acid (  VITAMIN C) 1000 MG tablet Take 1,000 mg by mouth 2 (two) times daily.     Marland Kitchen losartan (COZAAR) 100 MG tablet TAKE 1 TABLET BY MOUTH EVERY DAY 90 tablet 3  . montelukast (SINGULAIR) 10 MG tablet TAKE 1 TABLET BY MOUTH EVERY DAY 90 tablet 3  . Multiple Vitamin (MULTIVITAMIN WITH MINERALS) TABS tablet Take 1 tablet by mouth daily.    . tamoxifen (NOLVADEX) 20 MG tablet Take 20 mg by mouth at bedtime.     . triamterene-hydrochlorothiazide (MAXZIDE-25) 37.5-25 MG tablet Take 1 tablet by mouth  daily. Stop amlodipine (Patient taking differently: Take 1 tablet by mouth at bedtime. Stop amlodipine) 90 tablet 3   No current facility-administered medications on file prior to visit.    Allergies  Allergen Reactions  . Penicillins Swelling, Rash and Other (See Comments)    Older sister died from reaction to penicillin Has patient had a PCN reaction causing immediate rash, facial/tongue/throat swelling, SOB or lightheadedness with hypotension: Yes Has patient had a PCN reaction causing severe rash involving mucus membranes or skin necrosis: No Has patient had a PCN reaction that required hospitalization: No Has patient had a PCN reaction occurring within the last 10 years: No If all of the above answers are "NO", then may proceed with Cephalosporin use.   . Sulfa Antibiotics Rash  . Levaquin [Levofloxacin] Nausea And Vomiting   Social History   Socioeconomic History  . Marital status: Married    Spouse name: Not on file  . Number of children: Not on file  . Years of education: Not on file  . Highest education level: Not on file  Occupational History  . Not on file  Social Needs  . Financial resource strain: Not on file  . Food insecurity:    Worry: Not on file    Inability: Not on file  . Transportation needs:    Medical: Not on file    Non-medical: Not on file  Tobacco Use  . Smoking status: Former Smoker    Packs/day: 0.50    Years: 40.00    Pack years: 20.00    Types: Cigarettes  . Smokeless tobacco: Never Used  Substance and Sexual Activity  . Alcohol use: Yes    Alcohol/week: 0.0 standard drinks    Comment: social  . Drug use: No  . Sexual activity: Yes    Birth control/protection: Surgical    Comment: hysterectomy  Lifestyle  . Physical activity:    Days per week: Not on file    Minutes per session: Not on file  . Stress: Not on file  Relationships  . Social connections:    Talks on phone: Not on file    Gets together: Not on file    Attends  religious service: Not on file    Active member of club or organization: Not on file    Attends meetings of clubs or organizations: Not on file    Relationship status: Not on file  . Intimate partner violence:    Fear of current or ex partner: Not on file    Emotionally abused: Not on file    Physically abused: Not on file    Forced sexual activity: Not on file  Other Topics Concern  . Not on file  Social History Narrative  . Not on file     Review of Systems  All other systems reviewed and are negative.      Objective:   Physical Exam  Constitutional: She appears well-developed  and well-nourished. No distress.  HENT:  Head: Normocephalic and atraumatic.  Right Ear: External ear normal. There is drainage. Tympanic membrane is not perforated, not erythematous, not retracted and not bulging.  Left Ear: External ear normal. Tympanic membrane is not perforated and not erythematous.  Mouth/Throat: Oropharynx is clear and moist. No oropharyngeal exudate.  Neck: Normal range of motion. No tracheal deviation present.  Cardiovascular: Normal rate, regular rhythm and normal heart sounds.  Pulmonary/Chest: Effort normal and breath sounds normal. No stridor. No respiratory distress. She has no wheezes. She has no rales. She exhibits no tenderness.  Lymphadenopathy:    She has no cervical adenopathy.  Skin: She is not diaphoretic.  Vitals reviewed.  Right external auditory canal is pink and erythematous, slightly swollen with eczematous changes to the surface of the skin.  There is also yellow-white exudate deep within the auditory canal       Assessment & Plan:  Acute swimmer's ear of right side  Begin Cortisporin HC otic 4 drops inside the canal 4 times a day for 10 days.  Hydrocortisone cream can be applied BID to the reachable portions of the external ear that are also infected.  Recheck in 1 week if no better or sooner if worse

## 2018-04-12 DIAGNOSIS — C50911 Malignant neoplasm of unspecified site of right female breast: Secondary | ICD-10-CM | POA: Diagnosis not present

## 2018-04-12 DIAGNOSIS — C50912 Malignant neoplasm of unspecified site of left female breast: Secondary | ICD-10-CM | POA: Diagnosis not present

## 2018-04-17 ENCOUNTER — Other Ambulatory Visit: Payer: Self-pay

## 2018-04-17 ENCOUNTER — Ambulatory Visit: Payer: 59 | Attending: Plastic Surgery | Admitting: Rehabilitation

## 2018-04-17 ENCOUNTER — Other Ambulatory Visit: Payer: Self-pay | Admitting: Family Medicine

## 2018-04-17 ENCOUNTER — Other Ambulatory Visit: Payer: Self-pay | Admitting: Oncology

## 2018-04-17 ENCOUNTER — Encounter: Payer: Self-pay | Admitting: Rehabilitation

## 2018-04-17 DIAGNOSIS — M6281 Muscle weakness (generalized): Secondary | ICD-10-CM

## 2018-04-17 DIAGNOSIS — L599 Disorder of the skin and subcutaneous tissue related to radiation, unspecified: Secondary | ICD-10-CM | POA: Diagnosis present

## 2018-04-17 DIAGNOSIS — M25612 Stiffness of left shoulder, not elsewhere classified: Secondary | ICD-10-CM

## 2018-04-17 DIAGNOSIS — R293 Abnormal posture: Secondary | ICD-10-CM | POA: Diagnosis present

## 2018-04-17 DIAGNOSIS — M25611 Stiffness of right shoulder, not elsewhere classified: Secondary | ICD-10-CM

## 2018-04-17 NOTE — Patient Instructions (Signed)
Continue with doorway stretch until next visit

## 2018-04-17 NOTE — Therapy (Signed)
Krystal Brooks, [place], [address] Phone: [phone]   Fax:  [phone]  Physical Therapy Evaluation  Patient Details  Name: Krystal Brooks MRN: [id number] Date of Birth: [birth year] Referring Provider: Dr. Audelia Brooks   Encounter Date: 04/17/2018  PT End of Session - 04/17/18 1234    Visit Number  1    Number of Visits  9    Date for PT Re-Evaluation  05/22/18    PT Start Time  0935    PT Stop Time  1015    PT Time Calculation (min)  40 min    Activity Tolerance  Patient tolerated treatment well    Behavior During Therapy  [facility] for tasks assessed/performed       Past Medical History:  Diagnosis Date  . Adie's pupil    LEFT EYE  . Allergy   . Asthma   . Atherosclerosis of aorta (HCC)    bulge above renal arteries on mri 2016  . Cancer [facility]) 2017   right breast ca  . Cellulitis    chest  . Complication of anesthesia    VERY HARD TO WAKE UP    . Hypertension   . Personal history of radiation therapy   . PONV (postoperative nausea and vomiting)   . Smoker     Past Surgical History:  Procedure Laterality Date  . ABDOMINAL HYSTERECTOMY    . BREAST BIOPSY Right 01/02/2016  . BREAST EXCISIONAL BIOPSY Right 1979  . BREAST IMPLANT REMOVAL Bilateral 10/05/2017   Procedure: [id number] BILATERAL BREAST IMPLANTS;  Surgeon: Krystal Going, DO;  Location: [place];  Service: Plastics;  Laterality: Bilateral;  . BREAST LUMPECTOMY Right 01/27/2016  . BREAST LUMPECTOMY WITH RADIOACTIVE SEED AND SENTINEL LYMPH NODE BIOPSY Right 01/27/2016   Procedure: BREAST LUMPECTOMY WITH RADIOACTIVE SEED AND SENTINEL LYMPH NODE BIOPSY;  Surgeon: Krystal Seltzer, MD;  Location: [place];  Service: General;  Laterality: Right;  . BREAST RECONSTRUCTION WITH PLACEMENT OF TISSUE EXPANDER AND FLEX HD (ACELLULAR HYDRATED DERMIS) Bilateral 03/16/2017   Procedure: BILATERAL IMMEDIATE  BREAST RECONSTRUCTION WITH PLACEMENT OF TISSUE EXPANDER AND FLEX HD (ACELLULAR HYDRATED DERMIS);  Surgeon: Krystal Going, DO;  Location: [place];  Service: Plastics;  Laterality: Bilateral;  . BREAST SURGERY  1979   cyst removed rt breast  . CATARACT EXTRACTION W/ INTRAOCULAR LENS IMPLANT     RT EYE  . CESAREAN SECTION     x2  . EXCISION OF BREAST LESION Left 01/30/2018   Procedure: EXCISION OF LEFT BREAST SCAR;  Surgeon: Krystal Going, DO;  Location: [place];  Service: Plastics;  Laterality: Left;  . LATISSIMUS FLAP TO BREAST Right 01/30/2018   Procedure: RIGHT LATISSIMUS MYOCUTANEOUS FLAP FOR RIGHT CHEST WALL RECONSTRUCTION;  Surgeon: Krystal Going, DO;  Location: [place];  Service: Plastics;  Laterality: Right;  . MASTECTOMY W/ SENTINEL NODE BIOPSY Bilateral 03/16/2017   Procedure: BILATERAL TOTAL MASTECTOMY WITH LEFT SENTINEL LYMPH NODE BIOPSY;  Surgeon: Krystal Seltzer, MD;  Location: [place];  Service: General;  Laterality: Bilateral;  . REMOVAL OF BILATERAL TISSUE EXPANDERS WITH PLACEMENT OF BILATERAL BREAST IMPLANTS Bilateral 06/15/2017   Procedure: REMOVAL OF BILATERAL TISSUE EXPANDERS WITH PLACEMENT OF BILATERAL BREAST IMPLANTS;  Surgeon: Krystal Going, DO;  Location: [place];  Service: Plastics;  Laterality: Bilateral;  . TOTAL MASTECTOMY Bilateral 03/16/2017   SENTINAL NODE BIOPSY    There were no vitals filed for this visit.  Subjective Assessment - 04/17/18 0941    Subjective  Pt returns to therapy today after previous therapy here now with removal of bilateral implants and Rt latissimus flap.  Everything is just so tight and numb.  Getting muscles spasms in the Rt pectoralis major with reaching across the body    Pertinent History  hx of bilateral breast cancer; bilateral mastectomy with implants 06/15/17 with removal of implants and latissimus flap 01/30/18 due to persistent wound on the Rt.  Radiation to the Rt breast.      Limitations   Other (comment)   getting seatbelt on.  Pulling the car door with the Rt arm.  Reaching up    Patient Stated Goals  not have the spasms and not feel as tight      Currently in Pain?  No/denies   pain only with the arm movements 8/10 when it cramps        [facility] PT Assessment - 04/17/18 0001      Assessment   Medical Diagnosis  bil mastectomy due to breast cancer    Referring Provider  Dr. Lyndee Leo Brooks    Onset Date/Surgical Date  01/30/18    Hand Dominance  Right    Next MD Visit  unknown    Prior Therapy  yes here      Precautions   Precaution Comments  cancer and lymphedema bil      Restrictions   Weight Bearing Restrictions  No      Balance Screen   Has the patient fallen in the past 6 months  No    Has the patient had a decrease in activity level because of a fear of falling?   No    Is the patient reluctant to leave their home because of a fear of falling?   No      Home Environment   Living Environment  Private residence      Prior Function   Level of Independence  Independent    Leisure  39 year old grand daughter.  Not exercising since last surgery       Cognition   Overall Cognitive Status  Within Functional Limits for tasks assessed      Observation/Other Assessments   Observations  healed bilateral mastectomy with Rt lat flap well healed with obvious excessive scar tissue from multiple surgeries       Sensation   Additional Comments  reported numbness bilateral check and into scapular muscles       Coordination   Gross Motor Movements are Fluid and Coordinated  Yes      Posture/Postural Control   Posture/Postural Control  Postural limitations    Postural Limitations  Rounded Shoulders    Posture Comments  guarded      ROM / Strength   AROM / PROM / Strength  AROM;Strength;PROM      AROM   AROM Assessment Site  Shoulder    Right/Left Shoulder  Right;Left    Right Shoulder Flexion  145 Degrees    Right Shoulder ABduction  180 Degrees    Right  Shoulder Internal Rotation  --   behind the back WNL   Right Shoulder External Rotation  --   behind the head 80%   Right Shoulder Horizontal ABduction  30 Degrees    Right Shoulder Horizontal  ADduction  120 Degrees   worst movement with spasm in shoulder blade   Left Shoulder Flexion  140 Degrees    Left Shoulder ABduction  180  Degrees    Left Shoulder Internal Rotation  --   behind the back WNL   Left Shoulder External Rotation  --   behind the head 80%    Left Shoulder Horizontal ABduction  30 Degrees    Left Shoulder Horizontal ADduction  120 Degrees      PROM   PROM Assessment Site  Shoulder    Right/Left Shoulder  Right;Left    Right Shoulder Flexion  140 Degrees   slight pect spasm   Right Shoulder ABduction  170 Degrees    Right Shoulder Internal Rotation  80 Degrees    Right Shoulder External Rotation  90 Degrees    Left Shoulder Flexion  143 Degrees    Left Shoulder ABduction  170 Degrees    Left Shoulder Internal Rotation  80 Degrees    Left Shoulder External Rotation  90 Degrees      Strength   Strength Assessment Site  Shoulder    Right/Left Shoulder  Right;Left    Right Shoulder Flexion  4-/5    Right Shoulder Extension  4/5   with spasm   Right Shoulder ABduction  4-/5    Right Shoulder Internal Rotation  4-/5    Right Shoulder External Rotation  4-/5    Left Shoulder Flexion  4-/5    Left Shoulder Extension  4/5    Left Shoulder ABduction  4-/5    Left Shoulder Internal Rotation  4-/5    Left Shoulder External Rotation  4-/5        LYMPHEDEMA/ONCOLOGY QUESTIONNAIRE - 04/17/18 1006      Type   Cancer Type  right and left breast cancer      Surgeries   Mastectomy Date  03/16/17    Lumpectomy Date  01/27/16    Sentinel Lymph Node Biopsy Date  03/16/17    Other Surgery Date  --   removal of implants 10/05/17, lat flap Rt for persistent wound     Treatment   Active Chemotherapy Treatment  No    Past Chemotherapy Treatment  No    Active Radiation  Treatment  No    Past Radiation Treatment  Yes    Current Hormone Treatment  Yes    Drug Name  tamoxifen    Past Hormone Therapy  No      What other symptoms do you have   Are you Having Heaviness or Tightness  No      Lymphedema Assessments   Lymphedema Assessments  Upper extremities      Right Upper Extremity Lymphedema   10 cm Proximal to Olecranon Process  26.5 cm    Olecranon Process  22.3 cm    10 cm Proximal to Ulnar Styloid Process  20.9 cm    Just Proximal to Ulnar Styloid Process  14.5 cm    Across Hand at [employer]  18.5 cm    At [place] of 2nd Digit  5.5 cm      Left Upper Extremity Lymphedema   10 cm Proximal to Olecranon Process  26.3 cm    Olecranon Process  22.5 cm    10 cm Proximal to Ulnar Styloid Process  18.5 cm    Just Proximal to Ulnar Styloid Process  14.5 cm    Across Hand at [employer]  18.5 cm    At [place] of 2nd Digit  5.7 cm          Quick Dash - 04/17/18 0001    Open a  tight or new jar  Mild difficulty    Do heavy household chores (wash walls, wash floors)  Moderate difficulty    Carry a shopping bag or briefcase  Mild difficulty    Wash your back  Severe difficulty    Use a knife to cut food  No difficulty    Recreational activities in which you take some force or impact through your arm, shoulder, or hand (golf, hammering, tennis)  Moderate difficulty    During the past week, to what extent has your arm, shoulder or hand problem interfered with your normal social activities with family, friends, neighbors, or groups?  Slightly    During the past week, to what extent has your arm, shoulder or hand problem limited your work or other regular daily activities  Slightly    Arm, shoulder, or hand pain.  Moderate    Tingling (pins and needles) in your arm, shoulder, or hand  None    Difficulty Sleeping  Mild difficulty    DASH Score  31.82 %        Objective measurements completed on examination: See above findings.               PT Education - 04/17/18 1234    Education Details  POC    Person(s) Educated  Patient    Methods  Explanation    Comprehension  Verbalized understanding          PT Long Term Goals - 04/17/18 1242      PT LONG TERM GOAL #1   Title  Pt will decrease QDASH to 20% or less     Time  4    Period  Weeks    Status  New    Target Date  05/22/18      PT LONG TERM GOAL #2   Title  Pt will perform Rt shoulder horizontal adduction without muscle spasm    Time  4    Period  Weeks    Status  New    Target Date  05/22/18      PT LONG TERM GOAL #3   Title  Pt will be able to shut the car door without increased pain or muscle spasm    Time  4    Period  Weeks    Status  New    Target Date  05/22/18      PT LONG TERM GOAL #4   Title  Pt will decrease chest tightness by at least 25%     Time  4    Period  Weeks    Status  New    Target Date  05/22/18      PT LONG TERM GOAL #5   Title  Pt will be ind with strength ABC program for continued strength and mobility     Time  4    Period  Weeks    Status  New    Target Date  05/22/18             Plan - 04/17/18 1235    Clinical Impression Statement  Krystal Brooks is a pleasant 62 year old female who returns to PT after having implant removal and Rt latissimus flap 01/30/18 due to poor wound closure.  Her biggest complaint is muscle spasm with arm movements across the body and when reaching.  Her shoulder ROM has mild limitations AROM/PROM but most trouble is with spams during horizontal adduction, flexion, and resisted extension.  The spasms and chest tightness  due to scar tissue are limiting her mobility at this time.      History and Personal Factors relevant to plan of care:  hx of bilateral breast cancer; bilateral mastectomy with implants 06/15/17 with removal of implants and latissimus flap 01/30/18 due to persistent wound on the Rt.  Radiation to the Rt breast.      Clinical Presentation  Stable    Clinical  Decision Making  Moderate    Rehab Potential  Excellent    PT Frequency  2x / week    PT Duration  4 weeks    PT Treatment/Interventions  ADLs/Self Care Home Management;Therapeutic exercise;Patient/family education;Neuromuscular re-education;Manual techniques;Scar mobilization;Passive range of motion    PT Next Visit Plan  scar tissue/myofascial release Rt chest wall (L as needed), progress bil shoulder AROM into strength and mobility.  Include scapular and mid back strength due to lat flap    Consulted and Agree with Plan of Care  Patient       Patient will benefit from skilled therapeutic intervention in order to improve the following deficits and impairments:  Increased fascial restricitons, Decreased mobility, Decreased scar mobility, Increased muscle spasms, Decreased range of motion, Decreased strength, Impaired UE functional use  Visit Diagnosis: Stiffness of right shoulder, not elsewhere classified  Stiffness of left shoulder, not elsewhere classified  Abnormal posture  Disorder of the skin and subcutaneous tissue related to radiation, unspecified  Muscle weakness (generalized)     Problem List Patient Active Problem List   Diagnosis Date Noted  . Acquired absence of right breast 01/30/2018  . Status post bilateral breast reconstruction 06/24/2017  . Acquired absence of bilateral breasts and nipples 03/22/2017  . Breast cancer ([place]) 03/16/2017  . History of breast cancer in female 01/27/2017  . Malignant neoplasm of upper-inner quadrant of left breast in female, estrogen receptor positive ([place]) 01/03/2017  . HTN (hypertension) 06/14/2016  . Malignant neoplasm of lower-inner quadrant of right breast of female, estrogen receptor positive ([place]) 01/07/2016  . Atherosclerosis of aorta ([place])   . Smoker     Krystal Brooks, PT 04/17/2018, 12:44 PM  [place], [place], [address] Phone: [phone]    Fax:  [phone]  Name: Krystal Brooks MRN: [id number] Date of Birth: Dec 03, 1955

## 2018-05-04 ENCOUNTER — Ambulatory Visit: Payer: 59 | Attending: Plastic Surgery | Admitting: Physical Therapy

## 2018-05-04 ENCOUNTER — Encounter: Payer: Self-pay | Admitting: Physical Therapy

## 2018-05-04 DIAGNOSIS — R293 Abnormal posture: Secondary | ICD-10-CM

## 2018-05-04 DIAGNOSIS — L599 Disorder of the skin and subcutaneous tissue related to radiation, unspecified: Secondary | ICD-10-CM | POA: Insufficient documentation

## 2018-05-04 DIAGNOSIS — M25511 Pain in right shoulder: Secondary | ICD-10-CM | POA: Insufficient documentation

## 2018-05-04 DIAGNOSIS — M25611 Stiffness of right shoulder, not elsewhere classified: Secondary | ICD-10-CM | POA: Diagnosis present

## 2018-05-04 DIAGNOSIS — M25612 Stiffness of left shoulder, not elsewhere classified: Secondary | ICD-10-CM

## 2018-05-04 DIAGNOSIS — M6281 Muscle weakness (generalized): Secondary | ICD-10-CM | POA: Diagnosis present

## 2018-05-04 NOTE — Therapy (Signed)
[place], [place], [address] Phone: [phone]   Fax:  [phone]  Physical Therapy Treatment  Patient Details  Name: Krystal Brooks MRN: [id number] Date of Birth: 1956-02-22 Referring Provider: Dr. Audelia Hives   Encounter Date: 05/04/2018    Past Medical History:  Diagnosis Date  . Adie's pupil    LEFT EYE  . Allergy   . Asthma   . Atherosclerosis of aorta (HCC)    bulge above renal arteries on mri 2016  . Cancer [facility] 2017   right breast ca  . Cellulitis    chest  . Complication of anesthesia    VERY HARD TO WAKE UP    . Hypertension   . Personal history of radiation therapy   . PONV (postoperative nausea and vomiting)   . Smoker     Past Surgical History:  Procedure Laterality Date  . ABDOMINAL HYSTERECTOMY    . BREAST BIOPSY Right 01/02/2016  . BREAST EXCISIONAL BIOPSY Right 1979  . BREAST IMPLANT REMOVAL Bilateral 10/05/2017   Procedure: [id number] BILATERAL BREAST IMPLANTS;  Surgeon: Wallace Going, DO;  Location: [place];  Service: Plastics;  Laterality: Bilateral;  . BREAST LUMPECTOMY Right 01/27/2016  . BREAST LUMPECTOMY WITH RADIOACTIVE SEED AND SENTINEL LYMPH NODE BIOPSY Right 01/27/2016   Procedure: BREAST LUMPECTOMY WITH RADIOACTIVE SEED AND SENTINEL LYMPH NODE BIOPSY;  Surgeon: Excell Seltzer, MD;  Location: [place];  Service: General;  Laterality: Right;  . BREAST RECONSTRUCTION WITH PLACEMENT OF TISSUE EXPANDER AND FLEX HD (ACELLULAR HYDRATED DERMIS) Bilateral 03/16/2017   Procedure: BILATERAL IMMEDIATE BREAST RECONSTRUCTION WITH PLACEMENT OF TISSUE EXPANDER AND FLEX HD (ACELLULAR HYDRATED DERMIS);  Surgeon: Wallace Going, DO;  Location: [place];  Service: Plastics;  Laterality: Bilateral;  . BREAST SURGERY  1979   cyst removed rt breast  . CATARACT EXTRACTION W/ INTRAOCULAR LENS IMPLANT     RT EYE  . CESAREAN SECTION      x2  . EXCISION OF BREAST LESION Left 01/30/2018   Procedure: EXCISION OF LEFT BREAST SCAR;  Surgeon: Wallace Going, DO;  Location: [place];  Service: Plastics;  Laterality: Left;  . LATISSIMUS FLAP TO BREAST Right 01/30/2018   Procedure: RIGHT LATISSIMUS MYOCUTANEOUS FLAP FOR RIGHT CHEST WALL RECONSTRUCTION;  Surgeon: Wallace Going, DO;  Location: [place];  Service: Plastics;  Laterality: Right;  . MASTECTOMY W/ SENTINEL NODE BIOPSY Bilateral 03/16/2017   Procedure: BILATERAL TOTAL MASTECTOMY WITH LEFT SENTINEL LYMPH NODE BIOPSY;  Surgeon: Excell Seltzer, MD;  Location: [place];  Service: General;  Laterality: Bilateral;  . REMOVAL OF BILATERAL TISSUE EXPANDERS WITH PLACEMENT OF BILATERAL BREAST IMPLANTS Bilateral 06/15/2017   Procedure: REMOVAL OF BILATERAL TISSUE EXPANDERS WITH PLACEMENT OF BILATERAL BREAST IMPLANTS;  Surgeon: Wallace Going, DO;  Location: [place];  Service: Plastics;  Laterality: Bilateral;  . TOTAL MASTECTOMY Bilateral 03/16/2017   SENTINAL NODE BIOPSY    There were no vitals filed for this visit.  Subjective Assessment - 05/04/18 1526    Subjective  Pt states she is still having some cramping in her back She just returned from an extensive motorhome vacation and has been riding alot. She feels stiff    Pertinent History  hx of bilateral breast cancer; bilateral mastectomy with implants 06/15/17 with removal of implants in February and had cellulitis at on one of the drain sights and had diffculty with the medications and latissimus flap 01/30/18 due to persistent  wound on the Rt.  Radiation to the Rt breast.      Patient Stated Goals  not have the spasms and not feel as tight      Currently in Pain?  No/denies                       [facility] Adult PT Treatment/Exercise - 05/04/18 0001      Exercises   Exercises  Shoulder;Lumbar      Lumbar Exercises: Quadruped   Madcat/Old Horse  5 reps    Madcat/Old Horse Limitations   needs more cures expecially for thoracic extension, tried in standing with hands on counter top also      Shoulder Exercises: Supine   Protraction  AROM;Right;10 reps    Horizontal ABduction  AROM;Right;10 reps    Other Supine Exercises  lying over purple ball at thoracic spine with head supported for chest opening stretch       Shoulder Exercises: Seated   Other Seated Exercises  neck and upper thoracic rotation       Shoulder Exercises: Standing   Other Standing Exercises  fifth finger side of hand up the wall in a v x 10, then right side only with lift off at the top x10 , then same with left arm x10    Other Standing Exercises  doorway stretch for pec major by holding onto door at 90 degrees and turning away       Manual Therapy   Manual Therapy  Myofascial release    Myofascial Release  with crossed hands to anterior chest and at scar for skin on tissue glides                  PT Long Term Goals - 04/17/18 1242      PT LONG TERM GOAL #1   Title  Pt will decrease QDASH to 20% or less     Time  4    Period  Weeks    Status  New    Target Date  05/22/18      PT LONG TERM GOAL #2   Title  Pt will perform Rt shoulder horizontal adduction without muscle spasm    Time  4    Period  Weeks    Status  New    Target Date  05/22/18      PT LONG TERM GOAL #3   Title  Pt will be able to shut the car door without increased pain or muscle spasm    Time  4    Period  Weeks    Status  New    Target Date  05/22/18      PT LONG TERM GOAL #4   Title  Pt will decrease chest tightness by at least 25%     Time  4    Period  Weeks    Status  New    Target Date  05/22/18      PT LONG TERM GOAL #5   Title  Pt will be ind with strength ABC program for continued strength and mobility     Time  4    Period  Weeks    Status  New    Target Date  05/22/18            Plan - 05/04/18 1950    Clinical Impression Statement  Pt did well with intital exercises and needs to  continue to work on shoulder strengthening and thoracic  mobility     Rehab Potential  Excellent    PT Frequency  2x / week    PT Duration  4 weeks    PT Treatment/Interventions  ADLs/Self Care Home Management;Therapeutic exercise;Patient/family education;Neuromuscular re-education;Manual techniques;Scar mobilization;Passive range of motion    PT Next Visit Plan  teach supine scap series scar tissue/myofascial release Rt chest wall (L as needed), progress bil shoulder AROM into strength and mobility.  Include scapular and mid back strength due to lat flap    Consulted and Agree with Plan of Care  Patient       Patient will benefit from skilled therapeutic intervention in order to improve the following deficits and impairments:     Visit Diagnosis: Stiffness of right shoulder, not elsewhere classified  Stiffness of left shoulder, not elsewhere classified  Abnormal posture  Muscle weakness (generalized)     Problem List Patient Active Problem List   Diagnosis Date Noted  . Acquired absence of right breast 01/30/2018  . Status post bilateral breast reconstruction 06/24/2017  . Acquired absence of bilateral breasts and nipples 03/22/2017  . Breast cancer ([place]) 03/16/2017  . History of breast cancer in female 01/27/2017  . Malignant neoplasm of upper-inner quadrant of left breast in female, estrogen receptor positive ([place]) 01/03/2017  . HTN (hypertension) 06/14/2016  . Malignant neoplasm of lower-inner quadrant of right breast of female, estrogen receptor positive ([place]) 01/07/2016  . Atherosclerosis of aorta ([place])   . Smoker    Donato Heinz. Owens Shark PT  Norwood Levo 05/04/2018, 7:53 PM  [place], [place], [address] Phone: [phone]   Fax:  [phone]  Name: PRENTISS HAMMETT MRN: [id number] Date of Birth: [birth year]

## 2018-05-05 ENCOUNTER — Ambulatory Visit: Payer: 59 | Admitting: Physical Therapy

## 2018-05-05 ENCOUNTER — Encounter: Payer: Self-pay | Admitting: Physical Therapy

## 2018-05-05 DIAGNOSIS — M25611 Stiffness of right shoulder, not elsewhere classified: Secondary | ICD-10-CM | POA: Diagnosis not present

## 2018-05-05 DIAGNOSIS — M25612 Stiffness of left shoulder, not elsewhere classified: Secondary | ICD-10-CM

## 2018-05-05 DIAGNOSIS — M6281 Muscle weakness (generalized): Secondary | ICD-10-CM

## 2018-05-05 DIAGNOSIS — L599 Disorder of the skin and subcutaneous tissue related to radiation, unspecified: Secondary | ICD-10-CM

## 2018-05-05 DIAGNOSIS — M25511 Pain in right shoulder: Secondary | ICD-10-CM

## 2018-05-05 DIAGNOSIS — R293 Abnormal posture: Secondary | ICD-10-CM

## 2018-05-05 NOTE — Patient Instructions (Addendum)
Over Head Pull: Narrow and Wide Grip   Cancer Rehab [phone]   On back, knees bent, feet flat, band across thighs, elbows straight but relaxed. Pull hands apart (start). Keeping elbows straight, bring arms up and over head, hands toward floor. Keep pull steady on band. Hold momentarily. Return slowly, keeping pull steady, back to start. Then do same with a wider grip on the band (past shoulder width) Repeat _5-10__ times. Band color __yellow____   Side Pull: Double Arm   On back, knees bent, feet flat. Arms perpendicular to body, shoulder level, elbows straight but relaxed. Pull arms out to sides, elbows straight. Resistance band comes across collarbones, hands toward floor. Hold momentarily. Slowly return to starting position. Repeat _5-10__ times. Band color _yellow____   Sword   On back, knees bent, feet flat, left hand on left hip, right hand above left. Pull right arm DIAGONALLY (hip to shoulder) across chest. Bring right arm along head toward floor. Hold momentarily. Slowly return to starting position. Repeat _5-10__ times. Do with left arm. Band color _yellow_____   Shoulder Rotation: Double Arm   On back, knees bent, feet flat, elbows tucked at sides, bent 90, hands palms up. Pull hands apart and down toward floor, keeping elbows near sides. Hold momentarily. Slowly return to starting position. Repeat _5-10__ times. Band color __yellow____    Strengthening: Resisted Flexion   Cancer Rehab [phone]    Hold tubing with right arm at side. Pull forward and up. Move shoulder through pain-free range of motion. Repeat __5-10__ times per set. Do _1-2___ sessions per day.  Strengthening: Resisted Internal Rotation    Hold tubing in right hand, elbow at side and forearm out. Rotate forearm in across body. Repeat __5-10__ times per set. Do _1-2___ sessions per day.  Strengthening: Resisted Extension    Hold tubing in right hand, arm forward. Pull arm back, elbow  straight. Repeat _5-10___ times per set. Do _1-2___ sessions per day.  Strengthening: Resisted External Rotation    Hold tubing in right hand, elbow at side and forearm across body. Rotate forearm out. Repeat _5-10___ times per set. Do __1-2__ sessions per day.     PELVIC TILT  Lie on back, legs bent. Exhale, tilting top of pelvis back, pubic bone up, to flatten lower back. Inhale, rolling pelvis opposite way, top forward, pubic bone down, arch in back. Repeat __10__ times. Do __2__ sessions per day. Copyright  VHI. All rights reserved.     Lie with hips and knees bent. Slowly inhale, and then exhale. Pull navel toward spine and tighten pelvic floor. Hold for __10_ seconds. Continue to breathe in and out during hold. Rest for _10__ seconds. Repeat __10_ times. Do __2-3_ times a day.   Copyright  VHI. All rights reserved.  Knee Fold   Lie on back, legs bent, arms by sides. Exhale, lifting knee to chest. Inhale, returning. Keep abdominals flat, navel to spine. Repeat __10__ times, alternating legs. Do __2__ sessions per day.  Copyright  VHI. All rights reserved.  Knee Drop   Keep pelvis stable. Without rotating hips, slowly drop knee to side, pause, return to center, bring knee across midline toward opposite hip. Feel obliques engaging. Repeat for ___10_ times each leg.  Copyright  VHI. All rights reserved.  Isometric Hold With Pelvic Floor (Hook-Lying)    Copyright  VHI. All rights reserved.  Heel Slide to Straight   Slide one leg down to straight. Return. Be sure pelvis does not rock forward, tilt, rotate,  or tip to side. Do _10__ times. Restabilize pelvis. Repeat with other leg. Do __1-2_ sets, __2_ times per day.  http://ss.exer.us/16   Copyright  VHI. All rights reserved.

## 2018-05-05 NOTE — Therapy (Signed)
[place], [place], [address] Phone: [phone]   Fax:  [phone]  Physical Therapy Treatment  Patient Details  Name: Krystal Brooks MRN: [id number] Date of Birth: 1955/09/11 Referring Provider: Dr. Audelia Hives   Encounter Date: 05/05/2018  PT End of Session - 05/05/18 1149    Visit Number  3    Number of Visits  9    PT Start Time  6301    PT Stop Time  1100    PT Time Calculation (min)  45 min       Past Medical History:  Diagnosis Date  . Adie's pupil    LEFT EYE  . Allergy   . Asthma   . Atherosclerosis of aorta (HCC)    bulge above renal arteries on mri 2016  . Cancer [facility]) 2017   right breast ca  . Cellulitis    chest  . Complication of anesthesia    VERY HARD TO WAKE UP    . Hypertension   . Personal history of radiation therapy   . PONV (postoperative nausea and vomiting)   . Smoker     Past Surgical History:  Procedure Laterality Date  . ABDOMINAL HYSTERECTOMY    . BREAST BIOPSY Right 01/02/2016  . BREAST EXCISIONAL BIOPSY Right 1979  . BREAST IMPLANT REMOVAL Bilateral 10/05/2017   Procedure: [id number] BILATERAL BREAST IMPLANTS;  Surgeon: Wallace Going, DO;  Location: [place];  Service: Plastics;  Laterality: Bilateral;  . BREAST LUMPECTOMY Right 01/27/2016  . BREAST LUMPECTOMY WITH RADIOACTIVE SEED AND SENTINEL LYMPH NODE BIOPSY Right 01/27/2016   Procedure: BREAST LUMPECTOMY WITH RADIOACTIVE SEED AND SENTINEL LYMPH NODE BIOPSY;  Surgeon: Excell Seltzer, MD;  Location: [place];  Service: General;  Laterality: Right;  . BREAST RECONSTRUCTION WITH PLACEMENT OF TISSUE EXPANDER AND FLEX HD (ACELLULAR HYDRATED DERMIS) Bilateral 03/16/2017   Procedure: BILATERAL IMMEDIATE BREAST RECONSTRUCTION WITH PLACEMENT OF TISSUE EXPANDER AND FLEX HD (ACELLULAR HYDRATED DERMIS);  Surgeon: Wallace Going, DO;  Location: [place];  Service:  Plastics;  Laterality: Bilateral;  . BREAST SURGERY  1979   cyst removed rt breast  . CATARACT EXTRACTION W/ INTRAOCULAR LENS IMPLANT     RT EYE  . CESAREAN SECTION     x2  . EXCISION OF BREAST LESION Left 01/30/2018   Procedure: EXCISION OF LEFT BREAST SCAR;  Surgeon: Wallace Going, DO;  Location: [place];  Service: Plastics;  Laterality: Left;  . LATISSIMUS FLAP TO BREAST Right 01/30/2018   Procedure: RIGHT LATISSIMUS MYOCUTANEOUS FLAP FOR RIGHT CHEST WALL RECONSTRUCTION;  Surgeon: Wallace Going, DO;  Location: [place];  Service: Plastics;  Laterality: Right;  . MASTECTOMY W/ SENTINEL NODE BIOPSY Bilateral 03/16/2017   Procedure: BILATERAL TOTAL MASTECTOMY WITH LEFT SENTINEL LYMPH NODE BIOPSY;  Surgeon: Excell Seltzer, MD;  Location: [place];  Service: General;  Laterality: Bilateral;  . REMOVAL OF BILATERAL TISSUE EXPANDERS WITH PLACEMENT OF BILATERAL BREAST IMPLANTS Bilateral 06/15/2017   Procedure: REMOVAL OF BILATERAL TISSUE EXPANDERS WITH PLACEMENT OF BILATERAL BREAST IMPLANTS;  Surgeon: Wallace Going, DO;  Location: [place];  Service: Plastics;  Laterality: Bilateral;  . TOTAL MASTECTOMY Bilateral 03/16/2017   SENTINAL NODE BIOPSY    There were no vitals filed for this visit.  Subjective Assessment - 05/05/18 1019    Subjective  Pt says that she had a little pain at right neck that was temporary.     Pertinent History  hx of bilateral breast cancer; bilateral mastectomy with implants 06/15/17 with removal of implants in February and had cellulitis at on one of the drain sights and had diffculty with the medications and latissimus flap 01/30/18 due to persistent wound on the Rt.  Radiation to the Rt breast.      Patient Stated Goals  not have the spasms and not feel as tight                         [facility] Adult PT Treatment/Exercise - 05/05/18 0001      Exercises   Exercises  Shoulder;Lumbar      Lumbar Exercises: Stretches   Lower  Trunk Rotation  5 reps      Lumbar Exercises: Standing   Other Standing Lumbar Exercises  modified downward dog at wall with cat/cow stretch       Lumbar Exercises: Supine   Pelvic Tilt  5 reps    Clam  5 reps    Heel Slides  5 reps    Bent Knee Raise  5 reps      Shoulder Exercises: Supine   Horizontal ABduction  Strengthening;Right;Left;5 reps;Theraband    Theraband Level (Shoulder Horizontal ABduction)  Level 2 (Red)    External Rotation  Strengthening;Right;Left;5 reps;Theraband    Theraband Level (Shoulder External Rotation)  Level 2 (Red)    Flexion  Strengthening;Right;Left;5 reps   wide and narrow grip    Theraband Level (Shoulder Flexion)  Level 2 (Red)    Diagonals  Strengthening;Right;Left;5 reps;Theraband    Theraband Level (Shoulder Diagonals)  Level 2 (Red)      Shoulder Exercises: Standing   External Rotation  Strengthening;Right;5 reps;Theraband    Theraband Level (Shoulder External Rotation)  Level 2 (Red)    Internal Rotation  Strengthening;Right;5 reps;Theraband    Flexion  Strengthening;Right;5 reps;Theraband    Theraband Level (Shoulder Flexion)  Level 2 (Red)    Extension  Strengthening;Right;5 reps;Theraband    Row  Strengthening;Right;Left;10 reps    Theraband Level (Shoulder Row)  Level 2 (Red)    Other Standing Exercises  fifth finger side of hand up the wall in a v x 10, then right side only with lift off at the top x10 , then same with left arm x10    Other Standing Exercises  one hand on wall and other behind head and turn toward that side to stretch thoracic rotation       Manual Therapy   Manual Therapy  Myofascial release    Myofascial Release  with crossed hands to anterior chest and at scar for skin on tissue glides             PT Education - 05/05/18 1148    Education Details  supine scapular series, transverse abdominal series, rockwood series     Person(s) Educated  Patient    Methods  Explanation;Demonstration;Handout     Comprehension  Need further instruction          PT Long Term Goals - 04/17/18 1242      PT LONG TERM GOAL #1   Title  Pt will decrease QDASH to 20% or less     Time  4    Period  Weeks    Status  New    Target Date  05/22/18      PT LONG TERM GOAL #2   Title  Pt will perform Rt shoulder horizontal adduction without muscle spasm    Time  4    Period  Weeks    Status  New    Target Date  05/22/18      PT LONG TERM GOAL #3   Title  Pt will be able to shut the car door without increased pain or muscle spasm    Time  4    Period  Weeks    Status  New    Target Date  05/22/18      PT LONG TERM GOAL #4   Title  Pt will decrease chest tightness by at least 25%     Time  4    Period  Weeks    Status  New    Target Date  05/22/18      PT LONG TERM GOAL #5   Title  Pt will be ind with strength ABC program for continued strength and mobility     Time  4    Period  Weeks    Status  New    Target Date  05/22/18            Plan - 05/05/18 1149    Clinical Impression Statement  Upgraded to several exercises today as pt wants to learn a program she can continue next week when she won't be able to come to PT.  She will need more review of these exercises next session .  She did well with all exercises and seems to be improving quickly     PT Frequency  2x / week    PT Duration  4 weeks    PT Treatment/Interventions  ADLs/Self Care Home Management;Therapeutic exercise;Patient/family education;Neuromuscular re-education;Manual techniques;Scar mobilization;Passive range of motion    PT Next Visit Plan  Review supine scap series , tansverse abdominals, and rockwood sereis  do scar tissue/myofascial release Rt chest wall (L as needed), progress bil shoulder AROM into strength and mobility.  Include scapular and mid back strength due to lat flap    Consulted and Agree with Plan of Care  Patient       Patient will benefit from skilled therapeutic intervention in order to improve  the following deficits and impairments:  Increased fascial restricitons, Decreased mobility, Decreased scar mobility, Increased muscle spasms, Decreased range of motion, Decreased strength, Impaired UE functional use  Visit Diagnosis: Stiffness of right shoulder, not elsewhere classified  Stiffness of left shoulder, not elsewhere classified  Abnormal posture  Muscle weakness (generalized)  Acute pain of right shoulder  Disorder of the skin and subcutaneous tissue related to radiation, unspecified     Problem List Patient Active Problem List   Diagnosis Date Noted  . Acquired absence of right breast 01/30/2018  . Status post bilateral breast reconstruction 06/24/2017  . Acquired absence of bilateral breasts and nipples 03/22/2017  . Breast cancer ([place]) 03/16/2017  . History of breast cancer in female 01/27/2017  . Malignant neoplasm of upper-inner quadrant of left breast in female, estrogen receptor positive ([place]) 01/03/2017  . HTN (hypertension) 06/14/2016  . Malignant neoplasm of lower-inner quadrant of right breast of female, estrogen receptor positive ([place]) 01/07/2016  . Atherosclerosis of aorta ([place])   . Smoker    Donato Heinz. Owens Shark PT  Norwood Levo 05/05/2018, 11:53 AM  [place], [place], [address] Phone: [phone]   Fax:  [phone]  Name: Krystal Brooks MRN: [id number] Date of Birth: 1956-08-25

## 2018-05-08 ENCOUNTER — Ambulatory Visit: Payer: 59

## 2018-05-08 DIAGNOSIS — M25611 Stiffness of right shoulder, not elsewhere classified: Secondary | ICD-10-CM

## 2018-05-08 DIAGNOSIS — L599 Disorder of the skin and subcutaneous tissue related to radiation, unspecified: Secondary | ICD-10-CM

## 2018-05-08 DIAGNOSIS — M6281 Muscle weakness (generalized): Secondary | ICD-10-CM

## 2018-05-08 DIAGNOSIS — R293 Abnormal posture: Secondary | ICD-10-CM

## 2018-05-08 DIAGNOSIS — M25612 Stiffness of left shoulder, not elsewhere classified: Secondary | ICD-10-CM

## 2018-05-08 NOTE — Therapy (Signed)
[place], [place], [address] Phone: [phone]   Fax:  [phone]  Physical Therapy Treatment  Patient Details  Name: Krystal Brooks MRN: [id number] Date of Birth: 1956-03-18 Referring Provider: Dr. Audelia Hives   Encounter Date: 05/08/2018  PT End of Session - 05/08/18 1113    Visit Number  4    Number of Visits  9    Date for PT Re-Evaluation  05/22/18    PT Start Time  1024    PT Stop Time  1104    PT Time Calculation (min)  40 min    Activity Tolerance  Patient tolerated treatment well    Behavior During Therapy  [facility] for tasks assessed/performed       Past Medical History:  Diagnosis Date  . Adie's pupil    LEFT EYE  . Allergy   . Asthma   . Atherosclerosis of aorta (HCC)    bulge above renal arteries on mri 2016  . Cancer [facility]) 2017   right breast ca  . Cellulitis    chest  . Complication of anesthesia    VERY HARD TO WAKE UP    . Hypertension   . Personal history of radiation therapy   . PONV (postoperative nausea and vomiting)   . Smoker     Past Surgical History:  Procedure Laterality Date  . ABDOMINAL HYSTERECTOMY    . BREAST BIOPSY Right 01/02/2016  . BREAST EXCISIONAL BIOPSY Right 1979  . BREAST IMPLANT REMOVAL Bilateral 10/05/2017   Procedure: [id number] BILATERAL BREAST IMPLANTS;  Surgeon: Wallace Going, DO;  Location: [place];  Service: Plastics;  Laterality: Bilateral;  . BREAST LUMPECTOMY Right 01/27/2016  . BREAST LUMPECTOMY WITH RADIOACTIVE SEED AND SENTINEL LYMPH NODE BIOPSY Right 01/27/2016   Procedure: BREAST LUMPECTOMY WITH RADIOACTIVE SEED AND SENTINEL LYMPH NODE BIOPSY;  Surgeon: Excell Seltzer, MD;  Location: [place];  Service: General;  Laterality: Right;  . BREAST RECONSTRUCTION WITH PLACEMENT OF TISSUE EXPANDER AND FLEX HD (ACELLULAR HYDRATED DERMIS) Bilateral 03/16/2017   Procedure: BILATERAL IMMEDIATE BREAST  RECONSTRUCTION WITH PLACEMENT OF TISSUE EXPANDER AND FLEX HD (ACELLULAR HYDRATED DERMIS);  Surgeon: Wallace Going, DO;  Location: [place];  Service: Plastics;  Laterality: Bilateral;  . BREAST SURGERY  1979   cyst removed rt breast  . CATARACT EXTRACTION W/ INTRAOCULAR LENS IMPLANT     RT EYE  . CESAREAN SECTION     x2  . EXCISION OF BREAST LESION Left 01/30/2018   Procedure: EXCISION OF LEFT BREAST SCAR;  Surgeon: Wallace Going, DO;  Location: [place];  Service: Plastics;  Laterality: Left;  . LATISSIMUS FLAP TO BREAST Right 01/30/2018   Procedure: RIGHT LATISSIMUS MYOCUTANEOUS FLAP FOR RIGHT CHEST WALL RECONSTRUCTION;  Surgeon: Wallace Going, DO;  Location: [place];  Service: Plastics;  Laterality: Right;  . MASTECTOMY W/ SENTINEL NODE BIOPSY Bilateral 03/16/2017   Procedure: BILATERAL TOTAL MASTECTOMY WITH LEFT SENTINEL LYMPH NODE BIOPSY;  Surgeon: Excell Seltzer, MD;  Location: [place];  Service: General;  Laterality: Bilateral;  . REMOVAL OF BILATERAL TISSUE EXPANDERS WITH PLACEMENT OF BILATERAL BREAST IMPLANTS Bilateral 06/15/2017   Procedure: REMOVAL OF BILATERAL TISSUE EXPANDERS WITH PLACEMENT OF BILATERAL BREAST IMPLANTS;  Surgeon: Wallace Going, DO;  Location: [place];  Service: Plastics;  Laterality: Bilateral;  . TOTAL MASTECTOMY Bilateral 03/16/2017   SENTINAL NODE BIOPSY    There were no vitals filed for this visit.  Subjective Assessment - 05/08/18 1030    Subjective  Some soreness at upper back from new exercises but no pain.     Pertinent History  hx of bilateral breast cancer; bilateral mastectomy with implants 06/15/17 with removal of implants in February and had cellulitis at on one of the drain sights and had diffculty with the medications and latissimus flap 01/30/18 due to persistent wound on the Rt.  Radiation to the Rt breast.      Patient Stated Goals  not have the spasms and not feel as tight      Currently in Pain?  No/denies                        [facility] Adult PT Treatment/Exercise - 05/08/18 0001      Lumbar Exercises: Stretches   Lower Trunk Rotation  2 reps;10 seconds      Shoulder Exercises: Standing   Other Standing Exercises  Reviewed [place] with Rt UE and red theraband all motions with demonstration and tactile sues for correct UE position, especially for er and IR      Shoulder Exercises: Pulleys   Flexion  2 minutes    Flexion Limitations  Pt returned therapist demo     ABduction  1 minute      Manual Therapy   Myofascial Release  with crossed hands to anterior chest and at scar for skin on tissue glides                  PT Long Term Goals - 04/17/18 1242      PT LONG TERM GOAL #1   Title  Pt will decrease QDASH to 20% or less     Time  4    Period  Weeks    Status  New    Target Date  05/22/18      PT LONG TERM GOAL #2   Title  Pt will perform Rt shoulder horizontal adduction without muscle spasm    Time  4    Period  Weeks    Status  New    Target Date  05/22/18      PT LONG TERM GOAL #3   Title  Pt will be able to shut the car door without increased pain or muscle spasm    Time  4    Period  Weeks    Status  New    Target Date  05/22/18      PT LONG TERM GOAL #4   Title  Pt will decrease chest tightness by at least 25%     Time  4    Period  Weeks    Status  New    Target Date  05/22/18      PT LONG TERM GOAL #5   Title  Pt will be ind with strength ABC program for continued strength and mobility     Time  4    Period  Weeks    Status  New    Target Date  05/22/18            Plan - 05/08/18 1116    Clinical Impression Statement  Reviewed some of HEP issued at last visit that pt had questions about. She did well with technique once corrected and was able to return correct demonstration. Continued with manual therapy to focus on decreasing tightness at Rt chest wall and increasing Rt shoulder end ROM. Pt reports this very beneficial at  each session at decreasing tightness as this has much improved since start of care.       Rehab Potential  Excellent    PT Frequency  2x / week    PT Duration  4 weeks    PT Treatment/Interventions  ADLs/Self Care Home Management;Therapeutic exercise;Patient/family education;Neuromuscular re-education;Manual techniques;Scar mobilization;Passive range of motion    PT Next Visit Plan  Review any HEP prn and continue manual therapy to Rt chest wall (and Lt prn), progress bil shoulder AROM into strength and mobility. Include scapular and mid back strength due to lat flap.     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 Acquired absence of right breast 01/30/2018  . Status post bilateral breast reconstruction 06/24/2017  . Acquired absence of bilateral breasts and nipples 03/22/2017  . Breast cancer ([place]) 03/16/2017  . History of breast cancer in female 01/27/2017  . Malignant neoplasm of upper-inner quadrant of left breast in female, estrogen receptor positive ([place]) 01/03/2017  . HTN (hypertension) 06/14/2016  . Malignant neoplasm of lower-inner quadrant of right breast of female, estrogen receptor positive ([place]) 01/07/2016  . Atherosclerosis of aorta ([place])   . Smoker     Otelia Limes, PTA 05/08/2018, 11:34 AM  [place], [place], [address] Phone: [phone]   Fax:  [phone]  Name: Krystal Brooks MRN: [id number] Date of Birth: 08-19-1956

## 2018-05-09 ENCOUNTER — Ambulatory Visit: Payer: 59

## 2018-05-13 ENCOUNTER — Other Ambulatory Visit: Payer: Self-pay | Admitting: Family Medicine

## 2018-05-13 DIAGNOSIS — I10 Essential (primary) hypertension: Secondary | ICD-10-CM

## 2018-05-15 DIAGNOSIS — H25812 Combined forms of age-related cataract, left eye: Secondary | ICD-10-CM | POA: Diagnosis not present

## 2018-05-15 DIAGNOSIS — H2512 Age-related nuclear cataract, left eye: Secondary | ICD-10-CM | POA: Diagnosis not present

## 2018-05-18 ENCOUNTER — Encounter: Payer: Self-pay | Admitting: Physical Therapy

## 2018-05-18 ENCOUNTER — Ambulatory Visit: Payer: 59 | Admitting: Physical Therapy

## 2018-05-18 DIAGNOSIS — L599 Disorder of the skin and subcutaneous tissue related to radiation, unspecified: Secondary | ICD-10-CM

## 2018-05-18 DIAGNOSIS — R293 Abnormal posture: Secondary | ICD-10-CM

## 2018-05-18 DIAGNOSIS — M25511 Pain in right shoulder: Secondary | ICD-10-CM

## 2018-05-18 DIAGNOSIS — M6281 Muscle weakness (generalized): Secondary | ICD-10-CM

## 2018-05-18 DIAGNOSIS — M25611 Stiffness of right shoulder, not elsewhere classified: Secondary | ICD-10-CM

## 2018-05-18 NOTE — Therapy (Signed)
[place], [place], [address] Phone: [phone]   Fax:  [phone]  Physical Therapy Treatment  Patient Details  Name: Krystal Brooks MRN: [id number] Date of Birth: 1955-09-15 Referring Provider: Dr. Audelia Hives   Encounter Date: 05/18/2018  PT End of Session - 05/18/18 1926    Visit Number  5    Number of Visits  9    Date for PT Re-Evaluation  05/22/18    PT Start Time  1355    PT Stop Time  1435    PT Time Calculation (min)  40 min    Activity Tolerance  Patient tolerated treatment well    Behavior During Therapy  [facility] for tasks assessed/performed       Past Medical History:  Diagnosis Date  . Adie's pupil    LEFT EYE  . Allergy   . Asthma   . Atherosclerosis of aorta (HCC)    bulge above renal arteries on mri 2016  . Cancer [facility]) 2017   right breast ca  . Cellulitis    chest  . Complication of anesthesia    VERY HARD TO WAKE UP    . Hypertension   . Personal history of radiation therapy   . PONV (postoperative nausea and vomiting)   . Smoker     Past Surgical History:  Procedure Laterality Date  . ABDOMINAL HYSTERECTOMY    . BREAST BIOPSY Right 01/02/2016  . BREAST EXCISIONAL BIOPSY Right 1979  . BREAST IMPLANT REMOVAL Bilateral 10/05/2017   Procedure: [id number] BILATERAL BREAST IMPLANTS;  Surgeon: Wallace Going, DO;  Location: [place];  Service: Plastics;  Laterality: Bilateral;  . BREAST LUMPECTOMY Right 01/27/2016  . BREAST LUMPECTOMY WITH RADIOACTIVE SEED AND SENTINEL LYMPH NODE BIOPSY Right 01/27/2016   Procedure: BREAST LUMPECTOMY WITH RADIOACTIVE SEED AND SENTINEL LYMPH NODE BIOPSY;  Surgeon: Excell Seltzer, MD;  Location: [place];  Service: General;  Laterality: Right;  . BREAST RECONSTRUCTION WITH PLACEMENT OF TISSUE EXPANDER AND FLEX HD (ACELLULAR HYDRATED DERMIS) Bilateral 03/16/2017   Procedure: BILATERAL IMMEDIATE  BREAST RECONSTRUCTION WITH PLACEMENT OF TISSUE EXPANDER AND FLEX HD (ACELLULAR HYDRATED DERMIS);  Surgeon: Wallace Going, DO;  Location: [place];  Service: Plastics;  Laterality: Bilateral;  . BREAST SURGERY  1979   cyst removed rt breast  . CATARACT EXTRACTION W/ INTRAOCULAR LENS IMPLANT     RT EYE  . CESAREAN SECTION     x2  . EXCISION OF BREAST LESION Left 01/30/2018   Procedure: EXCISION OF LEFT BREAST SCAR;  Surgeon: Wallace Going, DO;  Location: [place];  Service: Plastics;  Laterality: Left;  . LATISSIMUS FLAP TO BREAST Right 01/30/2018   Procedure: RIGHT LATISSIMUS MYOCUTANEOUS FLAP FOR RIGHT CHEST WALL RECONSTRUCTION;  Surgeon: Wallace Going, DO;  Location: [place];  Service: Plastics;  Laterality: Right;  . MASTECTOMY W/ SENTINEL NODE BIOPSY Bilateral 03/16/2017   Procedure: BILATERAL TOTAL MASTECTOMY WITH LEFT SENTINEL LYMPH NODE BIOPSY;  Surgeon: Excell Seltzer, MD;  Location: [place];  Service: General;  Laterality: Bilateral;  . REMOVAL OF BILATERAL TISSUE EXPANDERS WITH PLACEMENT OF BILATERAL BREAST IMPLANTS Bilateral 06/15/2017   Procedure: REMOVAL OF BILATERAL TISSUE EXPANDERS WITH PLACEMENT OF BILATERAL BREAST IMPLANTS;  Surgeon: Wallace Going, DO;  Location: [place];  Service: Plastics;  Laterality: Bilateral;  . TOTAL MASTECTOMY Bilateral 03/16/2017   SENTINAL NODE BIOPSY    There were no vitals filed for this visit.  Subjective Assessment - 05/18/18 1402    Subjective  Pt states she is still having the cramps 1-2 times a day that sometimes takes her breath away.  She has the tightness that she always.     Patient Stated Goals  not have the spasms and not feel as tight      Currently in Pain?  Yes    Pain Score  4     Pain Location  Chest    Pain Orientation  Right    Pain Descriptors / Indicators  Tightness    Pain Type  Chronic pain    Pain Onset  More than a month ago                       [facility] Adult PT  Treatment/Exercise - 05/18/18 0001      Manual Therapy   Manual Therapy  Soft tissue mobilization;Myofascial release    Soft tissue mobilization  with patient in left sidelying and thick massage cream, soft tissue work to  tight muscles at back, latera and anterior chest  with scar mobility                  PT Long Term Goals - 04/17/18 1242      PT LONG TERM GOAL #1   Title  Pt will decrease QDASH to 20% or less     Time  4    Period  Weeks    Status  New    Target Date  05/22/18      PT LONG TERM GOAL #2   Title  Pt will perform Rt shoulder horizontal adduction without muscle spasm    Time  4    Period  Weeks    Status  New    Target Date  05/22/18      PT LONG TERM GOAL #3   Title  Pt will be able to shut the car door without increased pain or muscle spasm    Time  4    Period  Weeks    Status  New    Target Date  05/22/18      PT LONG TERM GOAL #4   Title  Pt will decrease chest tightness by at least 25%     Time  4    Period  Weeks    Status  New    Target Date  05/22/18      PT LONG TERM GOAL #5   Title  Pt will be ind with strength ABC program for continued strength and mobility     Time  4    Period  Weeks    Status  New    Target Date  05/22/18            Plan - 05/18/18 1926    Clinical Impression Statement  Pt received relieft with soft tissue work to tight areas today.  She reports she is doing her exercises at home     PT Treatment/Interventions  ADLs/Self Care Home Management;Therapeutic exercise;Patient/family education;Neuromuscular re-education;Manual techniques;Scar mobilization;Passive range of motion    PT Next Visit Plan   focus on soft tissue work with thick massage cream to posterior,lateral and anterior chest Review any HEP prn and continue manual therapy to Rt chest wall (and Lt prn), progress bil shoulder AROM into strength and mobility. Include scapular and mid back strength due to lat flap.        Patient will benefit  from skilled  therapeutic intervention in order to improve the following deficits and impairments:     Visit Diagnosis: Stiffness of right shoulder, not elsewhere classified  Muscle weakness (generalized)  Disorder of the skin and subcutaneous tissue related to radiation, unspecified  Abnormal posture  Acute pain of right shoulder     Problem List Patient Active Problem List   Diagnosis Date Noted  . Acquired absence of right breast 01/30/2018  . Status post bilateral breast reconstruction 06/24/2017  . Acquired absence of bilateral breasts and nipples 03/22/2017  . Breast cancer ([place]) 03/16/2017  . History of breast cancer in female 01/27/2017  . Malignant neoplasm of upper-inner quadrant of left breast in female, estrogen receptor positive ([place]) 01/03/2017  . HTN (hypertension) 06/14/2016  . Malignant neoplasm of lower-inner quadrant of right breast of female, estrogen receptor positive ([place]) 01/07/2016  . Atherosclerosis of aorta ([place])   . Smoker    Donato Heinz. Owens Shark PT  Norwood Levo 05/18/2018, 7:29 PM  [place], [place], [address] Phone: [phone]   Fax:  [phone]  Name: Krystal Brooks MRN: [id number] Date of Birth: 10-08-1955

## 2018-05-19 ENCOUNTER — Encounter: Payer: Self-pay | Admitting: Physical Therapy

## 2018-05-19 ENCOUNTER — Ambulatory Visit: Payer: 59 | Admitting: Physical Therapy

## 2018-05-19 DIAGNOSIS — M25611 Stiffness of right shoulder, not elsewhere classified: Secondary | ICD-10-CM

## 2018-05-19 DIAGNOSIS — M25511 Pain in right shoulder: Secondary | ICD-10-CM

## 2018-05-19 DIAGNOSIS — R293 Abnormal posture: Secondary | ICD-10-CM

## 2018-05-19 DIAGNOSIS — L599 Disorder of the skin and subcutaneous tissue related to radiation, unspecified: Secondary | ICD-10-CM

## 2018-05-19 DIAGNOSIS — M6281 Muscle weakness (generalized): Secondary | ICD-10-CM

## 2018-05-19 NOTE — Therapy (Signed)
[place], [place], [address] Phone: [phone]   Fax:  [phone]  Physical Therapy Treatment  Patient Details  Name: ROLLANDE THURSBY MRN: [id number] Date of Birth: November 18, 1955 Referring Provider: Dr. Audelia Hives   Encounter Date: 05/19/2018  PT End of Session - 05/19/18 1214    Visit Number  6    Number of Visits  9    Date for PT Re-Evaluation  05/22/18    PT Start Time  0935    PT Stop Time  1015    PT Time Calculation (min)  40 min    Activity Tolerance  Patient tolerated treatment well    Behavior During Therapy  [facility] for tasks assessed/performed       Past Medical History:  Diagnosis Date  . Adie's pupil    LEFT EYE  . Allergy   . Asthma   . Atherosclerosis of aorta (HCC)    bulge above renal arteries on mri 2016  . Cancer [facility]) 2017   right breast ca  . Cellulitis    chest  . Complication of anesthesia    VERY HARD TO WAKE UP    . Hypertension   . Personal history of radiation therapy   . PONV (postoperative nausea and vomiting)   . Smoker     Past Surgical History:  Procedure Laterality Date  . ABDOMINAL HYSTERECTOMY    . BREAST BIOPSY Right 01/02/2016  . BREAST EXCISIONAL BIOPSY Right 1979  . BREAST IMPLANT REMOVAL Bilateral 10/05/2017   Procedure: [id number] BILATERAL BREAST IMPLANTS;  Surgeon: Wallace Going, DO;  Location: [place];  Service: Plastics;  Laterality: Bilateral;  . BREAST LUMPECTOMY Right 01/27/2016  . BREAST LUMPECTOMY WITH RADIOACTIVE SEED AND SENTINEL LYMPH NODE BIOPSY Right 01/27/2016   Procedure: BREAST LUMPECTOMY WITH RADIOACTIVE SEED AND SENTINEL LYMPH NODE BIOPSY;  Surgeon: Excell Seltzer, MD;  Location: [place];  Service: General;  Laterality: Right;  . BREAST RECONSTRUCTION WITH PLACEMENT OF TISSUE EXPANDER AND FLEX HD (ACELLULAR HYDRATED DERMIS) Bilateral 03/16/2017   Procedure: BILATERAL IMMEDIATE  BREAST RECONSTRUCTION WITH PLACEMENT OF TISSUE EXPANDER AND FLEX HD (ACELLULAR HYDRATED DERMIS);  Surgeon: Wallace Going, DO;  Location: [place];  Service: Plastics;  Laterality: Bilateral;  . BREAST SURGERY  1979   cyst removed rt breast  . CATARACT EXTRACTION W/ INTRAOCULAR LENS IMPLANT     RT EYE  . CESAREAN SECTION     x2  . EXCISION OF BREAST LESION Left 01/30/2018   Procedure: EXCISION OF LEFT BREAST SCAR;  Surgeon: Wallace Going, DO;  Location: [place];  Service: Plastics;  Laterality: Left;  . LATISSIMUS FLAP TO BREAST Right 01/30/2018   Procedure: RIGHT LATISSIMUS MYOCUTANEOUS FLAP FOR RIGHT CHEST WALL RECONSTRUCTION;  Surgeon: Wallace Going, DO;  Location: [place];  Service: Plastics;  Laterality: Right;  . MASTECTOMY W/ SENTINEL NODE BIOPSY Bilateral 03/16/2017   Procedure: BILATERAL TOTAL MASTECTOMY WITH LEFT SENTINEL LYMPH NODE BIOPSY;  Surgeon: Excell Seltzer, MD;  Location: [place];  Service: General;  Laterality: Bilateral;  . REMOVAL OF BILATERAL TISSUE EXPANDERS WITH PLACEMENT OF BILATERAL BREAST IMPLANTS Bilateral 06/15/2017   Procedure: REMOVAL OF BILATERAL TISSUE EXPANDERS WITH PLACEMENT OF BILATERAL BREAST IMPLANTS;  Surgeon: Wallace Going, DO;  Location: [place];  Service: Plastics;  Laterality: Bilateral;  . TOTAL MASTECTOMY Bilateral 03/16/2017   SENTINAL NODE BIOPSY    There were no vitals filed for this visit.  Subjective Assessment - 05/19/18 1214    Subjective  Pt states she felt much looser this morning after having had treatment yesterday     Pertinent History  hx of bilateral breast cancer; bilateral mastectomy with implants 06/15/17 with removal of implants in February and had cellulitis at on one of the drain sights and had diffculty with the medications and latissimus flap 01/30/18 due to persistent wound on the Rt.  Radiation to the Rt breast.      Patient Stated Goals  not have the spasms and not feel as tight       Currently in Pain?  No/denies                       [facility] Adult PT Treatment/Exercise - 05/19/18 0001      Manual Therapy   Manual Therapy  Soft tissue mobilization;Myofascial release    Soft tissue mobilization  with patient in left sidelying and thick massage cream, soft tissue work to  tight muscles at back, latera and anterior chest  with scar mobility                  PT Long Term Goals - 04/17/18 1242      PT LONG TERM GOAL #1   Title  Pt will decrease QDASH to 20% or less     Time  4    Period  Weeks    Status  New    Target Date  05/22/18      PT LONG TERM GOAL #2   Title  Pt will perform Rt shoulder horizontal adduction without muscle spasm    Time  4    Period  Weeks    Status  New    Target Date  05/22/18      PT LONG TERM GOAL #3   Title  Pt will be able to shut the car door without increased pain or muscle spasm    Time  4    Period  Weeks    Status  New    Target Date  05/22/18      PT LONG TERM GOAL #4   Title  Pt will decrease chest tightness by at least 25%     Time  4    Period  Weeks    Status  New    Target Date  05/22/18      PT LONG TERM GOAL #5   Title  Pt will be ind with strength ABC program for continued strength and mobility     Time  4    Period  Weeks    Status  New    Target Date  05/22/18            Plan - 05/19/18 1215    Clinical Impression Statement  Pt still has some tightness at lateral chest just below axilla, but overall tissues are softening. Good chest scar moiblity today     Rehab Potential  Excellent    PT Frequency  2x / week    PT Duration  4 weeks    PT Next Visit Plan   REcert due next week focus on soft tissue work with thick massage cream to posterior,lateral and anterior chest Review any HEP prn and continue manual therapy to Rt chest wall (and Lt prn), progress bil shoulder AROM into strength and mobility. Include scapular and mid back strength due to lat flap.        Patient  will benefit from skilled therapeutic intervention in order to improve the following deficits and impairments:  Increased fascial restricitons, Decreased mobility, Decreased scar mobility, Increased muscle spasms, Decreased range of motion, Decreased strength, Impaired UE functional use  Visit Diagnosis: Stiffness of right shoulder, not elsewhere classified  Muscle weakness (generalized)  Disorder of the skin and subcutaneous tissue related to radiation, unspecified  Abnormal posture  Acute pain of right shoulder     Problem List Patient Active Problem List   Diagnosis Date Noted  . Acquired absence of right breast 01/30/2018  . Status post bilateral breast reconstruction 06/24/2017  . Acquired absence of bilateral breasts and nipples 03/22/2017  . Breast cancer ([place]) 03/16/2017  . History of breast cancer in female 01/27/2017  . Malignant neoplasm of upper-inner quadrant of left breast in female, estrogen receptor positive ([place]) 01/03/2017  . HTN (hypertension) 06/14/2016  . Malignant neoplasm of lower-inner quadrant of right breast of female, estrogen receptor positive ([place]) 01/07/2016  . Atherosclerosis of aorta ([place])   . Smoker    Donato Heinz. Owens Shark PT  [place] Levo 05/19/2018, 12:17 PM  [place], [place], [address] Phone: [phone]   Fax:  [phone]  Name: MAX ROMANO MRN: [id number] Date of Birth: 1956-04-17

## 2018-05-23 ENCOUNTER — Encounter: Payer: Self-pay | Admitting: Physical Therapy

## 2018-05-23 ENCOUNTER — Ambulatory Visit: Payer: 59 | Admitting: Physical Therapy

## 2018-05-23 DIAGNOSIS — M25511 Pain in right shoulder: Secondary | ICD-10-CM

## 2018-05-23 DIAGNOSIS — L599 Disorder of the skin and subcutaneous tissue related to radiation, unspecified: Secondary | ICD-10-CM

## 2018-05-23 DIAGNOSIS — M25611 Stiffness of right shoulder, not elsewhere classified: Secondary | ICD-10-CM

## 2018-05-23 DIAGNOSIS — R293 Abnormal posture: Secondary | ICD-10-CM

## 2018-05-23 DIAGNOSIS — M6281 Muscle weakness (generalized): Secondary | ICD-10-CM

## 2018-05-23 NOTE — Patient Instructions (Signed)
Access Code: [email or web address]  URL: [email or web address]  Date: 05/23/2018  Prepared by: Maudry Diego   Exercises  Sidelying Shoulder ER with Towel and Dumbbell - 10 reps - 3 sets - 1x daily - 7x weekly  Sidelying Shoulder Abduction - 10 reps - 3 sets - 1x daily - 7x weekly  Sidelying Thoracic and Shoulder Rotation - 10 reps - 3 sets - 1x daily - 7x weekly

## 2018-05-23 NOTE — Therapy (Signed)
[place], [place], [address] Phone: [phone]   Fax:  [phone]  Physical Therapy Treatment  Patient Details  Name: Krystal Brooks MRN: [id number] Date of Birth: 11-10-1955 Referring Provider: Dr. Marla Roe    Encounter Date: 05/23/2018  PT End of Session - 05/23/18 1116    Visit Number  7    Number of Visits  21    Date for PT Re-Evaluation  05/22/18    PT Start Time  1105    PT Stop Time  1150    PT Time Calculation (min)  45 min    Behavior During Therapy  [facility] for tasks assessed/performed       Past Medical History:  Diagnosis Date  . Adie's pupil    LEFT EYE  . Allergy   . Asthma   . Atherosclerosis of aorta (HCC)    bulge above renal arteries on mri 2016  . Cancer [facility]) 2017   right breast ca  . Cellulitis    chest  . Complication of anesthesia    VERY HARD TO WAKE UP    . Hypertension   . Personal history of radiation therapy   . PONV (postoperative nausea and vomiting)   . Smoker     Past Surgical History:  Procedure Laterality Date  . ABDOMINAL HYSTERECTOMY    . BREAST BIOPSY Right 01/02/2016  . BREAST EXCISIONAL BIOPSY Right 1979  . BREAST IMPLANT REMOVAL Bilateral 10/05/2017   Procedure: [id number] BILATERAL BREAST IMPLANTS;  Surgeon: Wallace Going, DO;  Location: [place];  Service: Plastics;  Laterality: Bilateral;  . BREAST LUMPECTOMY Right 01/27/2016  . BREAST LUMPECTOMY WITH RADIOACTIVE SEED AND SENTINEL LYMPH NODE BIOPSY Right 01/27/2016   Procedure: BREAST LUMPECTOMY WITH RADIOACTIVE SEED AND SENTINEL LYMPH NODE BIOPSY;  Surgeon: Excell Seltzer, MD;  Location: [place];  Service: General;  Laterality: Right;  . BREAST RECONSTRUCTION WITH PLACEMENT OF TISSUE EXPANDER AND FLEX HD (ACELLULAR HYDRATED DERMIS) Bilateral 03/16/2017   Procedure: BILATERAL IMMEDIATE BREAST RECONSTRUCTION WITH PLACEMENT OF TISSUE EXPANDER AND FLEX HD  (ACELLULAR HYDRATED DERMIS);  Surgeon: Wallace Going, DO;  Location: [place];  Service: Plastics;  Laterality: Bilateral;  . BREAST SURGERY  1979   cyst removed rt breast  . CATARACT EXTRACTION W/ INTRAOCULAR LENS IMPLANT     RT EYE  . CESAREAN SECTION     x2  . EXCISION OF BREAST LESION Left 01/30/2018   Procedure: EXCISION OF LEFT BREAST SCAR;  Surgeon: Wallace Going, DO;  Location: [place];  Service: Plastics;  Laterality: Left;  . LATISSIMUS FLAP TO BREAST Right 01/30/2018   Procedure: RIGHT LATISSIMUS MYOCUTANEOUS FLAP FOR RIGHT CHEST WALL RECONSTRUCTION;  Surgeon: Wallace Going, DO;  Location: [place];  Service: Plastics;  Laterality: Right;  . MASTECTOMY W/ SENTINEL NODE BIOPSY Bilateral 03/16/2017   Procedure: BILATERAL TOTAL MASTECTOMY WITH LEFT SENTINEL LYMPH NODE BIOPSY;  Surgeon: Excell Seltzer, MD;  Location: [employer];  Service: General;  Laterality: Bilateral;  . REMOVAL OF BILATERAL TISSUE EXPANDERS WITH PLACEMENT OF BILATERAL BREAST IMPLANTS Bilateral 06/15/2017   Procedure: REMOVAL OF BILATERAL TISSUE EXPANDERS WITH PLACEMENT OF BILATERAL BREAST IMPLANTS;  Surgeon: Wallace Going, DO;  Location: [place];  Service: Plastics;  Laterality: Bilateral;  . TOTAL MASTECTOMY Bilateral 03/16/2017   SENTINAL NODE BIOPSY    There were no vitals filed for this visit.  Subjective Assessment - 05/23/18 1116    Subjective  Pt states she felt looser for a couple of days after last treatment.  She feels like she wants to keep coming for more therapy "It helps"     Pertinent History  hx of bilateral breast cancer; bilateral mastectomy with implants 06/15/17 with removal of implants in February and had cellulitis at on one of the drain sights and had diffculty with the medications and latissimus flap 01/30/18 due to persistent wound on the Rt.  Radiation to the Rt breast.      Patient Stated Goals  not have the spasms and not feel as tight      Currently in  Pain?  No/denies         [facility] PT Assessment - 05/23/18 0001      Assessment   Medical Diagnosis  bil mastectomy due to breast cancer    Referring Provider  Dr. Marla Roe     Onset Date/Surgical Date  01/30/18      Prior Function   Level of Independence  Independent                   [facility] Adult PT Treatment/Exercise - 05/23/18 0001      Exercises   Exercises  Shoulder      Shoulder Exercises: Sidelying   External Rotation  Strengthening;Right;20 reps;Weights    External Rotation Weight (lbs)  2    External Rotation Limitations  pt was not able to control the weight when she tried to do it with 3#     ABduction  Strengthening;Right;10 reps;Weights    ABduction Weight (lbs)  2    Other Sidelying Exercises  small circles with 2# weight pointed to ceiling for more shoulder muscle control     Other Sidelying Exercises  upper thoracic backward rotation with arm outstretched and hand behind head       Manual Therapy   Manual Therapy  Soft tissue mobilization;Myofascial release    Soft tissue mobilization  with patient in left sidelying and thick massage cream, soft tissue work to  tight muscles at back, latera and anterior chest  with scar mobility    Myofascial Release  tried self myofascial release with patient leaning on  folded towel roll on trigger points              PT Education - 05/23/18 1214    Education Details  sidelying shoulder external rotation, abduction, small circles with 2# weights     Person(s) Educated  Patient    Methods  Explanation;Demonstration;Handout    Comprehension  Verbalized understanding;Returned demonstration          PT Long Term Goals - 05/23/18 1117      PT LONG TERM GOAL #1   Title  Pt will decrease QDASH to 20% or less     Status  On-going      PT LONG TERM GOAL #2   Title  Pt will perform Rt shoulder horizontal adduction without muscle spasm    Baseline  she sill has occasional muscle spasms during household  chores. 05/23/2018     Status  Partially Met      PT LONG TERM GOAL #3   Title  Pt will be able to shut the car door without increased pain or muscle spasm    Baseline  most of the time . still has trouble reaching back for the seat belt  (05/23/2018)     Status  Partially Met      PT LONG TERM GOAL #4  Title  Pt will decrease chest tightness by at least 25%     Status  Achieved      PT LONG TERM GOAL #5   Title  Pt will be ind with strength ABC program for continued strength and mobility     Status  On-going            Plan - 05/23/18 1215    Clinical Impression Statement  Pt has made significant progress with less pain and cramping in her right chest and back, but still has it occasionally when she does certain movements.  She continues to have weakness in her upper quadrant and needs to increase her strength.  She wants to continue physcial therapy with decreasing frequency of appointments to continue to decrease pain with increased activity and perform a strengthening program     Rehab Potential  Excellent    Clinical Impairments Affecting Rehab Potential  previous radiation and multiple surgeries of right chest     PT Frequency  2x / week   decreased to 1x a week, then every other week    PT Duration  8 weeks    PT Treatment/Interventions  ADLs/Self Care Home Management;Therapeutic exercise;Patient/family education;Neuromuscular re-education;Manual techniques;Scar mobilization;Passive range of motion    PT Next Visit Plan   focus on soft tissue work with thick massage cream to posterior,lateral and anterior progress  scapular and mid back strength due to lat flap.        Patient will benefit from skilled therapeutic intervention in order to improve the following deficits and impairments:  Increased fascial restricitons, Decreased mobility, Decreased scar mobility, Increased muscle spasms, Decreased range of motion, Decreased strength, Impaired UE functional use  Visit  Diagnosis: Stiffness of right shoulder, not elsewhere classified - Plan: PT plan of care cert/re-cert  Muscle weakness (generalized) - Plan: PT plan of care cert/re-cert  Disorder of the skin and subcutaneous tissue related to radiation, unspecified - Plan: PT plan of care cert/re-cert  Abnormal posture - Plan: PT plan of care cert/re-cert  Acute pain of right shoulder - Plan: PT plan of care cert/re-cert     Problem List Patient Active Problem List   Diagnosis Date Noted  . Acquired absence of right breast 01/30/2018  . Status post bilateral breast reconstruction 06/24/2017  . Acquired absence of bilateral breasts and nipples 03/22/2017  . Breast cancer ([place]) 03/16/2017  . History of breast cancer in female 01/27/2017  . Malignant neoplasm of upper-inner quadrant of left breast in female, estrogen receptor positive ([place]) 01/03/2017  . HTN (hypertension) 06/14/2016  . Malignant neoplasm of lower-inner quadrant of right breast of female, estrogen receptor positive ([place]) 01/07/2016  . Atherosclerosis of aorta ([place])   . Smoker    Donato Heinz. Owens Shark PT  Norwood Levo 05/23/2018, 12:24 PM  [place], [place], [address] Phone: [phone]   Fax:  [phone]  Name: ODELIA GRACIANO MRN: [id number] Date of Birth: [birth year]

## 2018-05-24 ENCOUNTER — Ambulatory Visit: Payer: 59 | Admitting: Physical Therapy

## 2018-05-24 DIAGNOSIS — H26491 Other secondary cataract, right eye: Secondary | ICD-10-CM | POA: Diagnosis not present

## 2018-05-30 ENCOUNTER — Encounter: Payer: Self-pay | Admitting: Physical Therapy

## 2018-05-30 ENCOUNTER — Ambulatory Visit: Payer: 59 | Attending: Plastic Surgery | Admitting: Physical Therapy

## 2018-05-30 DIAGNOSIS — M25611 Stiffness of right shoulder, not elsewhere classified: Secondary | ICD-10-CM | POA: Insufficient documentation

## 2018-05-30 DIAGNOSIS — R293 Abnormal posture: Secondary | ICD-10-CM

## 2018-05-30 DIAGNOSIS — M6281 Muscle weakness (generalized): Secondary | ICD-10-CM | POA: Diagnosis present

## 2018-05-30 DIAGNOSIS — M25612 Stiffness of left shoulder, not elsewhere classified: Secondary | ICD-10-CM | POA: Diagnosis present

## 2018-05-30 DIAGNOSIS — C50912 Malignant neoplasm of unspecified site of left female breast: Secondary | ICD-10-CM | POA: Diagnosis not present

## 2018-05-30 DIAGNOSIS — M25511 Pain in right shoulder: Secondary | ICD-10-CM | POA: Diagnosis present

## 2018-05-30 DIAGNOSIS — L599 Disorder of the skin and subcutaneous tissue related to radiation, unspecified: Secondary | ICD-10-CM

## 2018-05-30 DIAGNOSIS — C50911 Malignant neoplasm of unspecified site of right female breast: Secondary | ICD-10-CM | POA: Diagnosis not present

## 2018-05-30 NOTE — Therapy (Signed)
[place], [place], [address] Phone: [phone]   Fax:  [phone]  Physical Therapy Treatment  Patient Details  Name: Krystal Brooks MRN: [id number] Date of Birth: August 07, 1956 Referring Provider (PT): Dr. Marla Roe    Encounter Date: 05/30/2018  PT End of Session - 05/30/18 1100    Date for PT Re-Evaluation  07/23/18    PT Start Time  1015    PT Stop Time  1101    PT Time Calculation (min)  46 min    Activity Tolerance  Patient tolerated treatment well    Behavior During Therapy  [facility] for tasks assessed/performed       Past Medical History:  Diagnosis Date  . Adie's pupil    LEFT EYE  . Allergy   . Asthma   . Atherosclerosis of aorta (HCC)    bulge above renal arteries on mri 2016  . Cancer [facility]) 2017   right breast ca  . Cellulitis    chest  . Complication of anesthesia    VERY HARD TO WAKE UP    . Hypertension   . Personal history of radiation therapy   . PONV (postoperative nausea and vomiting)   . Smoker     Past Surgical History:  Procedure Laterality Date  . ABDOMINAL HYSTERECTOMY    . BREAST BIOPSY Right 01/02/2016  . BREAST EXCISIONAL BIOPSY Right 1979  . BREAST IMPLANT REMOVAL Bilateral 10/05/2017   Procedure: [id number] BILATERAL BREAST IMPLANTS;  Surgeon: Wallace Going, DO;  Location: [place];  Service: Plastics;  Laterality: Bilateral;  . BREAST LUMPECTOMY Right 01/27/2016  . BREAST LUMPECTOMY WITH RADIOACTIVE SEED AND SENTINEL LYMPH NODE BIOPSY Right 01/27/2016   Procedure: BREAST LUMPECTOMY WITH RADIOACTIVE SEED AND SENTINEL LYMPH NODE BIOPSY;  Surgeon: Excell Seltzer, MD;  Location: [place];  Service: General;  Laterality: Right;  . BREAST RECONSTRUCTION WITH PLACEMENT OF TISSUE EXPANDER AND FLEX HD (ACELLULAR HYDRATED DERMIS) Bilateral 03/16/2017   Procedure: BILATERAL IMMEDIATE BREAST RECONSTRUCTION WITH PLACEMENT OF TISSUE  EXPANDER AND FLEX HD (ACELLULAR HYDRATED DERMIS);  Surgeon: Wallace Going, DO;  Location: [place];  Service: Plastics;  Laterality: Bilateral;  . BREAST SURGERY  1979   cyst removed rt breast  . CATARACT EXTRACTION W/ INTRAOCULAR LENS IMPLANT     RT EYE  . CESAREAN SECTION     x2  . EXCISION OF BREAST LESION Left 01/30/2018   Procedure: EXCISION OF LEFT BREAST SCAR;  Surgeon: Wallace Going, DO;  Location: [place];  Service: Plastics;  Laterality: Left;  . LATISSIMUS FLAP TO BREAST Right 01/30/2018   Procedure: RIGHT LATISSIMUS MYOCUTANEOUS FLAP FOR RIGHT CHEST WALL RECONSTRUCTION;  Surgeon: Wallace Going, DO;  Location: [place];  Service: Plastics;  Laterality: Right;  . MASTECTOMY W/ SENTINEL NODE BIOPSY Bilateral 03/16/2017   Procedure: BILATERAL TOTAL MASTECTOMY WITH LEFT SENTINEL LYMPH NODE BIOPSY;  Surgeon: Excell Seltzer, MD;  Location: [place];  Service: General;  Laterality: Bilateral;  . REMOVAL OF BILATERAL TISSUE EXPANDERS WITH PLACEMENT OF BILATERAL BREAST IMPLANTS Bilateral 06/15/2017   Procedure: REMOVAL OF BILATERAL TISSUE EXPANDERS WITH PLACEMENT OF BILATERAL BREAST IMPLANTS;  Surgeon: Wallace Going, DO;  Location: [place];  Service: Plastics;  Laterality: Bilateral;  . TOTAL MASTECTOMY Bilateral 03/16/2017   SENTINAL NODE BIOPSY    There were no vitals filed for this visit.  Subjective Assessment - 05/30/18 1018    Subjective  Pt says she was  a little sore ( tight)  after last session.  "I don't know if it was the weights or what, but it took till about Friday til I felt better"  She says she is fine now.       Pertinent History  hx of bilateral breast cancer; bilateral mastectomy with implants 06/15/17 with removal of implants in February and had cellulitis at on one of the drain sights and had diffculty with the medications and latissimus flap 01/30/18 due to persistent wound on the Rt.  Radiation to the Rt breast.      Patient Stated  Goals  not have the spasms and not feel as tight      Currently in Pain?  No/denies                       [facility] Adult PT Treatment/Exercise - 05/30/18 0001      Exercises   Exercises  Shoulder;Lumbar      Lumbar Exercises: Quadruped   Single Arm Raise  Right;Left;10 reps    Opposite Arm/Leg Raise  Right arm/Left leg;Left arm/Right leg;5 reps    Other Quadruped Lumbar Exercises  hands under shoulders for a low cobra pose with scapular protraction and retraction       Shoulder Exercises: Sidelying   External Rotation  AROM;10 reps      Manual Therapy   Manual Therapy  Soft tissue mobilization;Myofascial release    Soft tissue mobilization  with patient in left sidelying and thick massage cream, soft tissue work to  tight muscles at back, latera and anterior chest  with scar mobility                  PT Long Term Goals - 05/23/18 1117      PT LONG TERM GOAL #1   Title  Pt will decrease QDASH to 20% or less     Status  On-going      PT LONG TERM GOAL #2   Title  Pt will perform Rt shoulder horizontal adduction without muscle spasm    Baseline  she sill has occasional muscle spasms during household chores. 05/23/2018     Status  Partially Met      PT LONG TERM GOAL #3   Title  Pt will be able to shut the car door without increased pain or muscle spasm    Baseline  most of the time . still has trouble reaching back for the seat belt  (05/23/2018)     Status  Partially Met      PT LONG TERM GOAL #4   Title  Pt will decrease chest tightness by at least 25%     Status  Achieved      PT LONG TERM GOAL #5   Title  Pt will be ind with strength ABC program for continued strength and mobility     Status  On-going            Plan - 05/30/18 1102    Clinical Impression Statement  Pt continues to have a tight tender trigger point at posterior axilla that released some with prolonged pressure.  Twitching was felt at trigger point and also in front of  chest where latt flap was placed.  Upgraded exercise to prone and quadruped and active sidelying external rotation.. Overall, pt is progressing and is now decreased to one time per week.     Rehab Potential  Excellent    Clinical Impairments Affecting Rehab  Potential  previous radiation and multiple surgeries of right chest     PT Frequency  1x / week    PT Duration  8 weeks    PT Treatment/Interventions  ADLs/Self Care Home Management;Therapeutic exercise;Patient/family education;Neuromuscular re-education;Manual techniques;Scar mobilization;Passive range of motion    PT Next Visit Plan   focus on soft tissue work with thick massage cream to posterior,lateral and anterior progress  scapular and mid back strength due to lat flap Progress exercises as indicated     Consulted and Agree with Plan of Care  Patient       Patient will benefit from skilled therapeutic intervention in order to improve the following deficits and impairments:  Increased fascial restricitons, Decreased mobility, Decreased scar mobility, Increased muscle spasms, Decreased range of motion, Decreased strength, Impaired UE functional use  Visit Diagnosis: Stiffness of right shoulder, not elsewhere classified  Muscle weakness (generalized)  Disorder of the skin and subcutaneous tissue related to radiation, unspecified  Abnormal posture  Acute pain of right shoulder     Problem List Patient Active Problem List   Diagnosis Date Noted  . Acquired absence of right breast 01/30/2018  . Status post bilateral breast reconstruction 06/24/2017  . Acquired absence of bilateral breasts and nipples 03/22/2017  . Breast cancer ([place]) 03/16/2017  . History of breast cancer in female 01/27/2017  . Malignant neoplasm of upper-inner quadrant of left breast in female, estrogen receptor positive ([place]) 01/03/2017  . HTN (hypertension) 06/14/2016  . Malignant neoplasm of lower-inner quadrant of right breast of female, estrogen  receptor positive ([place]) 01/07/2016  . Atherosclerosis of aorta ([place])   . Smoker    Donato Heinz. Owens Shark PT  Norwood Levo 05/30/2018, 11:06 AM  [place], [place], [address] Phone: [phone]   Fax:  [phone]  Name: Krystal Brooks MRN: [id number] Date of Birth: 1955-12-14

## 2018-06-06 ENCOUNTER — Ambulatory Visit: Payer: 59 | Admitting: Physical Therapy

## 2018-06-06 DIAGNOSIS — M25511 Pain in right shoulder: Secondary | ICD-10-CM

## 2018-06-06 DIAGNOSIS — L599 Disorder of the skin and subcutaneous tissue related to radiation, unspecified: Secondary | ICD-10-CM

## 2018-06-06 DIAGNOSIS — M6281 Muscle weakness (generalized): Secondary | ICD-10-CM

## 2018-06-06 DIAGNOSIS — R293 Abnormal posture: Secondary | ICD-10-CM

## 2018-06-06 DIAGNOSIS — M25611 Stiffness of right shoulder, not elsewhere classified: Secondary | ICD-10-CM | POA: Diagnosis not present

## 2018-06-06 NOTE — Therapy (Signed)
[place], [place], [address] Phone: [phone]   Fax:  [phone]  Physical Therapy Treatment  Patient Details  Name: Krystal Brooks MRN: [id number] Date of Birth: 12-17-1955 Referring Provider (PT): Dr. Marla Roe    Encounter Date: 06/06/2018  PT End of Session - 06/06/18 1150    Visit Number  9    Number of Visits  21    Date for PT Re-Evaluation  07/23/18    PT Start Time  1105    PT Stop Time  1145    PT Time Calculation (min)  40 min    Activity Tolerance  Patient tolerated treatment well    Behavior During Therapy  [facility] for tasks assessed/performed       Past Medical History:  Diagnosis Date  . Adie's pupil    LEFT EYE  . Allergy   . Asthma   . Atherosclerosis of aorta (HCC)    bulge above renal arteries on mri 2016  . Cancer [facility]) 2017   right breast ca  . Cellulitis    chest  . Complication of anesthesia    VERY HARD TO WAKE UP    . Hypertension   . Personal history of radiation therapy   . PONV (postoperative nausea and vomiting)   . Smoker     Past Surgical History:  Procedure Laterality Date  . ABDOMINAL HYSTERECTOMY    . BREAST BIOPSY Right 01/02/2016  . BREAST EXCISIONAL BIOPSY Right 1979  . BREAST IMPLANT REMOVAL Bilateral 10/05/2017   Procedure: [id number] BILATERAL BREAST IMPLANTS;  Surgeon: Wallace Going, DO;  Location: [place];  Service: Plastics;  Laterality: Bilateral;  . BREAST LUMPECTOMY Right 01/27/2016  . BREAST LUMPECTOMY WITH RADIOACTIVE SEED AND SENTINEL LYMPH NODE BIOPSY Right 01/27/2016   Procedure: BREAST LUMPECTOMY WITH RADIOACTIVE SEED AND SENTINEL LYMPH NODE BIOPSY;  Surgeon: Excell Seltzer, MD;  Location: [place];  Service: General;  Laterality: Right;  . BREAST RECONSTRUCTION WITH PLACEMENT OF TISSUE EXPANDER AND FLEX HD (ACELLULAR HYDRATED DERMIS) Bilateral 03/16/2017   Procedure: BILATERAL IMMEDIATE  BREAST RECONSTRUCTION WITH PLACEMENT OF TISSUE EXPANDER AND FLEX HD (ACELLULAR HYDRATED DERMIS);  Surgeon: Wallace Going, DO;  Location: [place];  Service: Plastics;  Laterality: Bilateral;  . BREAST SURGERY  1979   cyst removed rt breast  . CATARACT EXTRACTION W/ INTRAOCULAR LENS IMPLANT     RT EYE  . CESAREAN SECTION     x2  . EXCISION OF BREAST LESION Left 01/30/2018   Procedure: EXCISION OF LEFT BREAST SCAR;  Surgeon: Wallace Going, DO;  Location: [place];  Service: Plastics;  Laterality: Left;  . LATISSIMUS FLAP TO BREAST Right 01/30/2018   Procedure: RIGHT LATISSIMUS MYOCUTANEOUS FLAP FOR RIGHT CHEST WALL RECONSTRUCTION;  Surgeon: Wallace Going, DO;  Location: [place];  Service: Plastics;  Laterality: Right;  . MASTECTOMY W/ SENTINEL NODE BIOPSY Bilateral 03/16/2017   Procedure: BILATERAL TOTAL MASTECTOMY WITH LEFT SENTINEL LYMPH NODE BIOPSY;  Surgeon: Excell Seltzer, MD;  Location: [place];  Service: General;  Laterality: Bilateral;  . REMOVAL OF BILATERAL TISSUE EXPANDERS WITH PLACEMENT OF BILATERAL BREAST IMPLANTS Bilateral 06/15/2017   Procedure: REMOVAL OF BILATERAL TISSUE EXPANDERS WITH PLACEMENT OF BILATERAL BREAST IMPLANTS;  Surgeon: Wallace Going, DO;  Location: [place];  Service: Plastics;  Laterality: Bilateral;  . TOTAL MASTECTOMY Bilateral 03/16/2017   SENTINAL NODE BIOPSY    There were no vitals filed for this visit.  Subjective Assessment - 06/06/18 1106    Subjective  Pt says she is doing well and she has not having spasms     Pertinent History  hx of bilateral breast cancer; bilateral mastectomy with implants 06/15/17 with removal of implants in February and had cellulitis at on one of the drain sights and had diffculty with the medications and latissimus flap 01/30/18 due to persistent wound on the Rt.  Radiation to the Rt breast.      Patient Stated Goals  not have the spasms and not feel as tight      Currently in Pain?  No/denies                        [facility] Adult PT Treatment/Exercise - 06/06/18 0001      Exercises   Exercises  Shoulder      Manual Therapy   Manual Therapy  Soft tissue mobilization;Myofascial release    Soft tissue mobilization  with patient in left sidelying and thick massage cream, soft tissue work to  tight muscles at back, latera and anterior chest  with scar mobility    Myofascial Release  self myofascial release with patient in sidelying on  folded towel roll on trigger points                   PT Long Term Goals - 05/23/18 1117      PT LONG TERM GOAL #1   Title  Pt will decrease QDASH to 20% or less     Status  On-going      PT LONG TERM GOAL #2   Title  Pt will perform Rt shoulder horizontal adduction without muscle spasm    Baseline  she sill has occasional muscle spasms during household chores. 05/23/2018     Status  Partially Met      PT LONG TERM GOAL #3   Title  Pt will be able to shut the car door without increased pain or muscle spasm    Baseline  most of the time . still has trouble reaching back for the seat belt  (05/23/2018)     Status  Partially Met      PT LONG TERM GOAL #4   Title  Pt will decrease chest tightness by at least 25%     Status  Achieved      PT LONG TERM GOAL #5   Title  Pt will be ind with strength ABC program for continued strength and mobility     Status  On-going            Plan - 06/06/18 1151    Clinical Impression Statement  Pt continues with gradual gains. She has tightness in lateral chest with sidebending that was helped with self myofacial release and soft tissue work. She has some soreness in infraspinatus and teres minor when stretched in horizontal adduction in sidelying that is relieved when arm placed back on hip     Rehab Potential  Excellent    Clinical Impairments Affecting Rehab Potential  previous radiation and multiple surgeries of right chest     PT Frequency  1x / week    PT Duration  8 weeks     PT Next Visit Plan    continue with stretching and progress as indicated  focus on soft tissue work with thick massage cream to posterior,lateral and anterior progress  scapular and mid back strength due to lat flap  Consulted and Agree with Plan of Care  Patient       Patient will benefit from skilled therapeutic intervention in order to improve the following deficits and impairments:  Increased fascial restricitons, Decreased mobility, Decreased scar mobility, Increased muscle spasms, Decreased range of motion, Decreased strength, Impaired UE functional use  Visit Diagnosis: Stiffness of right shoulder, not elsewhere classified  Muscle weakness (generalized)  Disorder of the skin and subcutaneous tissue related to radiation, unspecified  Abnormal posture  Acute pain of right shoulder     Problem List Patient Active Problem List   Diagnosis Date Noted  . Acquired absence of right breast 01/30/2018  . Status post bilateral breast reconstruction 06/24/2017  . Acquired absence of bilateral breasts and nipples 03/22/2017  . Breast cancer ([place]) 03/16/2017  . History of breast cancer in female 01/27/2017  . Malignant neoplasm of upper-inner quadrant of left breast in female, estrogen receptor positive ([place]) 01/03/2017  . HTN (hypertension) 06/14/2016  . Malignant neoplasm of lower-inner quadrant of right breast of female, estrogen receptor positive ([place]) 01/07/2016  . Atherosclerosis of aorta ([place])   . Smoker    Donato Heinz. Owens Shark PT  Norwood Levo 06/06/2018, 11:54 AM  [place], [place], [address] Phone: [phone]   Fax:  [phone]  Name: Krystal Brooks MRN: [id number] Date of Birth: June 20, 1956

## 2018-06-13 ENCOUNTER — Ambulatory Visit: Payer: 59 | Admitting: Physical Therapy

## 2018-06-13 ENCOUNTER — Encounter: Payer: Self-pay | Admitting: Physical Therapy

## 2018-06-13 DIAGNOSIS — L599 Disorder of the skin and subcutaneous tissue related to radiation, unspecified: Secondary | ICD-10-CM

## 2018-06-13 DIAGNOSIS — M6281 Muscle weakness (generalized): Secondary | ICD-10-CM

## 2018-06-13 DIAGNOSIS — M25611 Stiffness of right shoulder, not elsewhere classified: Secondary | ICD-10-CM

## 2018-06-13 DIAGNOSIS — M25511 Pain in right shoulder: Secondary | ICD-10-CM

## 2018-06-13 DIAGNOSIS — R293 Abnormal posture: Secondary | ICD-10-CM

## 2018-06-13 NOTE — Therapy (Signed)
[place], [place], [address] Phone: [phone]   Fax:  [phone]  Physical Therapy Treatment   Progress Note Reporting Period  to 04/17/2018 to 06/13/2018  See note below for Objective Data and Assessment of Progress/Goals.       Patient Details  Name: Krystal Brooks MRN: [id number] Date of Birth: 1955/10/04 Referring Provider (PT): Dr. Marla Roe    Encounter Date: 06/13/2018  PT End of Session - 06/13/18 1124    Visit Number  10    Number of Visits  21    Date for PT Re-Evaluation  07/23/18    PT Start Time  1018    PT Stop Time  1100    PT Time Calculation (min)  42 min    Behavior During Therapy  [facility] for tasks assessed/performed       Past Medical History:  Diagnosis Date  . Adie's pupil    LEFT EYE  . Allergy   . Asthma   . Atherosclerosis of aorta (HCC)    bulge above renal arteries on mri 2016  . Cancer [facility]) 2017   right breast ca  . Cellulitis    chest  . Complication of anesthesia    VERY HARD TO WAKE UP    . Hypertension   . Personal history of radiation therapy   . PONV (postoperative nausea and vomiting)   . Smoker     Past Surgical History:  Procedure Laterality Date  . ABDOMINAL HYSTERECTOMY    . BREAST BIOPSY Right 01/02/2016  . BREAST EXCISIONAL BIOPSY Right 1979  . BREAST IMPLANT REMOVAL Bilateral 10/05/2017   Procedure: [id number] BILATERAL BREAST IMPLANTS;  Surgeon: Wallace Going, DO;  Location: [place];  Service: Plastics;  Laterality: Bilateral;  . BREAST LUMPECTOMY Right 01/27/2016  . BREAST LUMPECTOMY WITH RADIOACTIVE SEED AND SENTINEL LYMPH NODE BIOPSY Right 01/27/2016   Procedure: BREAST LUMPECTOMY WITH RADIOACTIVE SEED AND SENTINEL LYMPH NODE BIOPSY;  Surgeon: Excell Seltzer, MD;  Location: [place];  Service: General;  Laterality: Right;  . BREAST RECONSTRUCTION WITH PLACEMENT OF TISSUE EXPANDER AND FLEX  HD (ACELLULAR HYDRATED DERMIS) Bilateral 03/16/2017   Procedure: BILATERAL IMMEDIATE BREAST RECONSTRUCTION WITH PLACEMENT OF TISSUE EXPANDER AND FLEX HD (ACELLULAR HYDRATED DERMIS);  Surgeon: Wallace Going, DO;  Location: [place];  Service: Plastics;  Laterality: Bilateral;  . BREAST SURGERY  1979   cyst removed rt breast  . CATARACT EXTRACTION W/ INTRAOCULAR LENS IMPLANT     RT EYE  . CESAREAN SECTION     x2  . EXCISION OF BREAST LESION Left 01/30/2018   Procedure: EXCISION OF LEFT BREAST SCAR;  Surgeon: Wallace Going, DO;  Location: [place];  Service: Plastics;  Laterality: Left;  . LATISSIMUS FLAP TO BREAST Right 01/30/2018   Procedure: RIGHT LATISSIMUS MYOCUTANEOUS FLAP FOR RIGHT CHEST WALL RECONSTRUCTION;  Surgeon: Wallace Going, DO;  Location: [place];  Service: Plastics;  Laterality: Right;  . MASTECTOMY W/ SENTINEL NODE BIOPSY Bilateral 03/16/2017   Procedure: BILATERAL TOTAL MASTECTOMY WITH LEFT SENTINEL LYMPH NODE BIOPSY;  Surgeon: Excell Seltzer, MD;  Location: [place];  Service: General;  Laterality: Bilateral;  . REMOVAL OF BILATERAL TISSUE EXPANDERS WITH PLACEMENT OF BILATERAL BREAST IMPLANTS Bilateral 06/15/2017   Procedure: REMOVAL OF BILATERAL TISSUE EXPANDERS WITH PLACEMENT OF BILATERAL BREAST IMPLANTS;  Surgeon: Wallace Going, DO;  Location: [place];  Service: Plastics;  Laterality: Bilateral;  . TOTAL MASTECTOMY Bilateral  03/16/2017   SENTINAL NODE BIOPSY    There were no vitals filed for this visit.  Subjective Assessment - 06/13/18 1022    Subjective  Pt states she had a good weekend and she is continuing to get better.     Pertinent History  hx of bilateral breast cancer; bilateral mastectomy with implants 06/15/17 with removal of implants in February and had cellulitis at on one of the drain sights and had diffculty with the medications and latissimus flap 01/30/18 due to persistent wound on the Rt.  Radiation to the Rt breast.       Limitations  Other (comment)    Patient Stated Goals  not have the spasms and not feel as tight                         [facility] Adult PT Treatment/Exercise - 06/13/18 0001      Exercises   Exercises  Shoulder      Shoulder Exercises: Supine   Protraction  Strengthening;Right;Left;Theraband   holding red band in her hands with tension    Theraband Level (Shoulder Protraction)  Level 2 (Red)      Shoulder Exercises: Sidelying   External Rotation  Strengthening;Right;10 reps;Weights    External Rotation Weight (lbs)  2    External Rotation Limitations  pt was not able to control the weight when she tried to do it with 3#     Flexion  AROM;Right;10 reps    Flexion Limitations  pt needed verbal and tactile cues to keep arm on level plane.  She was able to improve at end of session but had a point in range ( about 110 degrees of flexion that she lost control of arm     ABduction  AROM;Right;10 reps   at 90 degrees of shoulder flexion      Manual Therapy   Manual Therapy  Soft tissue mobilization    Soft tissue mobilization  with patient in left sidelying and thick massage cream, soft tissue work to  tight muscles at back, latera and anterior chest  with scar mobility                  PT Long Term Goals - 05/23/18 1117      PT LONG TERM GOAL #1   Title  Pt will decrease QDASH to 20% or less     Status  On-going      PT LONG TERM GOAL #2   Title  Pt will perform Rt shoulder horizontal adduction without muscle spasm    Baseline  she sill has occasional muscle spasms during household chores. 05/23/2018     Status  Partially Met      PT LONG TERM GOAL #3   Title  Pt will be able to shut the car door without increased pain or muscle spasm    Baseline  most of the time . still has trouble reaching back for the seat belt  (05/23/2018)     Status  Partially Met      PT LONG TERM GOAL #4   Title  Pt will decrease chest tightness by at least 25%     Status   Achieved      PT LONG TERM GOAL #5   Title  Pt will be ind with strength ABC program for continued strength and mobility     Status  On-going            Plan -  06/13/18 1125    Clinical Impression Statement  Pt continues to improve.  upgraded to serratus strengthening with theraband and sidelying exercise to work on motor control of right shoulder.     Rehab Potential  Excellent    Clinical Impairments Affecting Rehab Potential  previous radiation and multiple surgeries of right chest     PT Frequency  1x / week    PT Duration  8 weeks    PT Treatment/Interventions  ADLs/Self Care Home Management;Therapeutic exercise;Patient/family education;Neuromuscular re-education;Manual techniques;Scar mobilization;Passive range of motion    PT Next Visit Plan    continue with serratus strengthening and work on shoulder strength in sidelying   focus on soft tissue work with thick massage cream to posterior,lateral and anterior progress  scapular and mid back strength due to lat flap    Consulted and Agree with Plan of Care  Patient       Patient will benefit from skilled therapeutic intervention in order to improve the following deficits and impairments:  Increased fascial restricitons, Decreased mobility, Decreased scar mobility, Increased muscle spasms, Decreased range of motion, Decreased strength, Impaired UE functional use  Visit Diagnosis: Stiffness of right shoulder, not elsewhere classified  Muscle weakness (generalized)  Disorder of the skin and subcutaneous tissue related to radiation, unspecified  Abnormal posture  Acute pain of right shoulder     Problem List Patient Active Problem List   Diagnosis Date Noted  . Acquired absence of right breast 01/30/2018  . Status post bilateral breast reconstruction 06/24/2017  . Acquired absence of bilateral breasts and nipples 03/22/2017  . Breast cancer ([place]) 03/16/2017  . History of breast cancer in female 01/27/2017  .  Malignant neoplasm of upper-inner quadrant of left breast in female, estrogen receptor positive ([place]) 01/03/2017  . HTN (hypertension) 06/14/2016  . Malignant neoplasm of lower-inner quadrant of right breast of female, estrogen receptor positive ([place]) 01/07/2016  . Atherosclerosis of aorta ([place])   . Smoker    Donato Heinz. Owens Shark PT  Norwood Levo 06/13/2018, 11:28 AM  [place], [place], [address] Phone: [phone]   Fax:  [phone]  Name: Krystal Brooks MRN: [id number] Date of Birth: Feb 07, 1956

## 2018-06-19 ENCOUNTER — Other Ambulatory Visit: Payer: Self-pay | Admitting: *Deleted

## 2018-06-19 DIAGNOSIS — Z170 Estrogen receptor positive status [ER+]: Principal | ICD-10-CM

## 2018-06-19 DIAGNOSIS — C50311 Malignant neoplasm of lower-inner quadrant of right female breast: Secondary | ICD-10-CM

## 2018-06-20 ENCOUNTER — Telehealth: Payer: Self-pay | Admitting: Oncology

## 2018-06-20 ENCOUNTER — Other Ambulatory Visit: Payer: Self-pay | Admitting: Oncology

## 2018-06-20 ENCOUNTER — Ambulatory Visit: Payer: 59 | Admitting: Family Medicine

## 2018-06-20 ENCOUNTER — Encounter: Payer: Self-pay | Admitting: Oncology

## 2018-06-20 ENCOUNTER — Inpatient Hospital Stay (HOSPITAL_BASED_OUTPATIENT_CLINIC_OR_DEPARTMENT_OTHER): Payer: 59 | Admitting: Oncology

## 2018-06-20 ENCOUNTER — Inpatient Hospital Stay: Payer: 59 | Attending: Oncology

## 2018-06-20 VITALS — BP 111/51 | HR 101 | Temp 98.5°F | Resp 18 | Ht 61.0 in | Wt 128.5 lb

## 2018-06-20 DIAGNOSIS — R5383 Other fatigue: Secondary | ICD-10-CM | POA: Insufficient documentation

## 2018-06-20 DIAGNOSIS — Z923 Personal history of irradiation: Secondary | ICD-10-CM

## 2018-06-20 DIAGNOSIS — Z79810 Long term (current) use of selective estrogen receptor modulators (SERMs): Secondary | ICD-10-CM | POA: Diagnosis not present

## 2018-06-20 DIAGNOSIS — I10 Essential (primary) hypertension: Secondary | ICD-10-CM

## 2018-06-20 DIAGNOSIS — Z87891 Personal history of nicotine dependence: Secondary | ICD-10-CM | POA: Diagnosis not present

## 2018-06-20 DIAGNOSIS — Z79899 Other long term (current) drug therapy: Secondary | ICD-10-CM | POA: Diagnosis not present

## 2018-06-20 DIAGNOSIS — Z170 Estrogen receptor positive status [ER+]: Secondary | ICD-10-CM | POA: Diagnosis not present

## 2018-06-20 DIAGNOSIS — C50312 Malignant neoplasm of lower-inner quadrant of left female breast: Secondary | ICD-10-CM

## 2018-06-20 DIAGNOSIS — Z90710 Acquired absence of both cervix and uterus: Secondary | ICD-10-CM

## 2018-06-20 DIAGNOSIS — Z9013 Acquired absence of bilateral breasts and nipples: Secondary | ICD-10-CM | POA: Diagnosis not present

## 2018-06-20 DIAGNOSIS — Z800 Family history of malignant neoplasm of digestive organs: Secondary | ICD-10-CM | POA: Insufficient documentation

## 2018-06-20 DIAGNOSIS — C50311 Malignant neoplasm of lower-inner quadrant of right female breast: Secondary | ICD-10-CM | POA: Diagnosis present

## 2018-06-20 DIAGNOSIS — M62838 Other muscle spasm: Secondary | ICD-10-CM

## 2018-06-20 DIAGNOSIS — C50212 Malignant neoplasm of upper-inner quadrant of left female breast: Secondary | ICD-10-CM

## 2018-06-20 LAB — CBC WITH DIFFERENTIAL (CANCER CENTER ONLY)
ABS IMMATURE GRANULOCYTES: 0.02 10*3/uL (ref 0.00–0.07)
BASOS ABS: 0 10*3/uL (ref 0.0–0.1)
Basophils Relative: 0 %
Eosinophils Absolute: 0 10*3/uL (ref 0.0–0.5)
Eosinophils Relative: 1 %
HEMATOCRIT: 36.2 % (ref 36.0–46.0)
HEMOGLOBIN: 11.6 g/dL — AB (ref 12.0–15.0)
IMMATURE GRANULOCYTES: 0 %
LYMPHS ABS: 1.2 10*3/uL (ref 0.7–4.0)
LYMPHS PCT: 24 %
MCH: 29.1 pg (ref 26.0–34.0)
MCHC: 32 g/dL (ref 30.0–36.0)
MCV: 90.7 fL (ref 80.0–100.0)
Monocytes Absolute: 0.7 10*3/uL (ref 0.1–1.0)
Monocytes Relative: 13 %
NEUTROS PCT: 62 %
NRBC: 0 % (ref 0.0–0.2)
Neutro Abs: 3.2 10*3/uL (ref 1.7–7.7)
Platelet Count: 152 10*3/uL (ref 150–400)
RBC: 3.99 MIL/uL (ref 3.87–5.11)
RDW: 13.2 % (ref 11.5–15.5)
WBC Count: 5.2 10*3/uL (ref 4.0–10.5)

## 2018-06-20 LAB — CMP (CANCER CENTER ONLY)
ALBUMIN: 3.6 g/dL (ref 3.5–5.0)
ALT: 26 U/L (ref 0–44)
ANION GAP: 11 (ref 5–15)
AST: 25 U/L (ref 15–41)
Alkaline Phosphatase: 52 U/L (ref 38–126)
BUN: 18 mg/dL (ref 8–23)
CO2: 26 mmol/L (ref 22–32)
CREATININE: 1.3 mg/dL — AB (ref 0.44–1.00)
Calcium: 8.8 mg/dL — ABNORMAL LOW (ref 8.9–10.3)
Chloride: 99 mmol/L (ref 98–111)
GFR, EST AFRICAN AMERICAN: 50 mL/min — AB (ref >60)
GFR, Estimated: 43 mL/min — ABNORMAL LOW (ref >60)
Glucose, Bld: 128 mg/dL — ABNORMAL HIGH (ref 70–99)
Potassium: 3.5 mmol/L (ref 3.5–5.1)
Sodium: 136 mmol/L (ref 135–145)
Total Bilirubin: 0.2 mg/dL — ABNORMAL LOW (ref 0.3–1.2)
Total Protein: 7.7 g/dL (ref 6.5–8.1)

## 2018-06-20 MED ORDER — TAMOXIFEN CITRATE 20 MG PO TABS: 20.0000 mg | ORAL_TABLET | Freq: Every day | ORAL | 4 refills | Status: DC

## 2018-06-20 NOTE — Progress Notes (Signed)
[place]  [phone] [phone]     ID: DACIE MANDEL DOB: [birth year]  MR#: [id number]  JOA#:[id number]  Patient Care Team: Susy Frizzle, MD as PCP - General (Family Medicine) Excell Seltzer, MD as Consulting Physician (General Surgery) Druscilla Brownie, MD as Consulting Physician (Dermatology) Dillingham, [name], DO as Attending Physician (Plastic Surgery) Magrinat, Virgie Dad, MD as Consulting Physician (Oncology) Delice Bison, Charlestine Massed, NP as Nurse Practitioner (Hematology and Oncology) OTHER MD:  CHIEF COMPLAINT: Recurrent estrogen receptor positive breast cancer  CURRENT TREATMENT: Tamoxifen  INTERVAL HISTORY: Abbigayle returns today for follow-up and treatment of her estrogen receptor positive breast cancer. She continues on tamoxifen, with good tolerance. She denies issues with hot flashes and vaginal discharge.   Since her last visit to the office, she underwent right chest wall reconstruction with a right latissimus myocutaneous flap and excision of scar contracture on the left.  Pathology [phone]) on 01/30/2018  showed: , Right Breast Mastectomy, findings consistent with scar. No malignancy identified. Scar -, Left Lateral Breast, findings consistent with scar. No malignancy identified.   Recall that she had her prior implants removed because of chest discomfort.   REVIEW OF SYSTEMS: Symphonie reports that for exercise, she tries to walk a couple times a week. She notes some fatigue. She reports random muscle spasms to the right chest wall. She states it feels like a cramp, and will resolve in a few minutes with deep breaths and massaging. She denies any pain. She denies unusual headaches, visual changes, nausea, vomiting, or dizziness. There has been no unusual cough, phlegm production, or pleurisy. This been no change in bowel or bladder habits. She denies unexplained fatigue or unexplained weight loss, bleeding, rash, or fever. A  detailed review of systems was otherwise stable.    BREAST CANCER HISTORY: From Dr Ernestina Penna 01/14/16 note:  "[name] 62 y.o. female is here because of Her newly diagnosed right breast cancer. She is accompanied by her husband and of friend to our multidisciplinary rest clinic today.  This was found by screening mammogram,  she denies any palpable mass, skin change or nipple discharge, or ulcer constitutional symptoms before the screening.  Her prior mammo 6 years ago. She had a right breast cyst removed in 1979. "  Malignant neoplasm of lower-inner quadrant of right breast of female, estrogen receptor positive ([place])   12/29/2015 Mammogram    Diagnostic right mammogram showed a 2.3 x 2.1 x 2.2 cm group of coarse calcification within the medial slightly lower right breast.      01/02/2016 Initial Diagnosis    Breast cancer of lower-inner quadrant of right female breast ([place])      01/02/2016 Initial Biopsy    right breast LIQ core needle biopsy showed LCIS, with a small focus of invasive lobular carcinoma, grade 2.       01/02/2016 Receptors her2    ER 10% positive, moderate staining. PR negative. Insufficient tissue for HER-2 testing.      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 0/2 SLN.       01/27/2016 Receptors her2    The surgical sample of the invasive carcinoma was not sufficient for Her2 test and other additional  studies.      03/09/2016 - 04/19/2016 Radiation Therapy    Adjuvant breast radiation [name]). Right breast: 50 Gy in 25 fractions. Right breast "boost": 10 Gy in 5 fractions.        04/2016 -  Anti-estrogen oral therapy    Anastrozole 1 mg  daily. Planned duration of therapy: 5 years       PAST MEDICAL HISTORY: Past Medical History:  Diagnosis Date  . Adie's pupil    LEFT EYE  . Allergy   . Asthma   . Atherosclerosis of aorta (HCC)    bulge above renal arteries on mri 2016  . Cancer [facility]) 2017   right breast ca  . Cellulitis    chest  . Complication of anesthesia    VERY HARD TO WAKE UP    . Hypertension   . Personal history of radiation therapy   . PONV (postoperative nausea and vomiting)   . Smoker     PAST SURGICAL HISTORY: Past Surgical History:  Procedure Laterality Date  . ABDOMINAL HYSTERECTOMY    . BREAST BIOPSY Right 01/02/2016  . BREAST EXCISIONAL BIOPSY Right 1979  . BREAST IMPLANT REMOVAL Bilateral 10/05/2017   Procedure: ZDGLOVFIE BILATERAL BREAST IMPLANTS;  Surgeon: Wallace Going, DO;  Location: [place];  Service: Plastics;  Laterality: Bilateral;  . BREAST LUMPECTOMY Right 01/27/2016  . BREAST LUMPECTOMY WITH RADIOACTIVE SEED AND SENTINEL LYMPH NODE BIOPSY Right 01/27/2016   Procedure: BREAST LUMPECTOMY WITH RADIOACTIVE SEED AND SENTINEL LYMPH NODE BIOPSY;  Surgeon: Excell Seltzer, MD;  Location: [place];  Service: General;  Laterality: Right;  . BREAST RECONSTRUCTION WITH PLACEMENT OF TISSUE EXPANDER AND FLEX HD (ACELLULAR HYDRATED DERMIS) Bilateral 03/16/2017   Procedure: BILATERAL IMMEDIATE BREAST RECONSTRUCTION WITH PLACEMENT OF TISSUE EXPANDER AND FLEX HD (ACELLULAR HYDRATED DERMIS);  Surgeon: Wallace Going, DO;  Location: [place];  Service: Plastics;  Laterality: Bilateral;  . BREAST SURGERY  1979   cyst removed rt breast  . CATARACT EXTRACTION W/ INTRAOCULAR LENS IMPLANT     RT EYE  . CESAREAN SECTION     x2  . EXCISION OF BREAST LESION Left 01/30/2018   Procedure: EXCISION OF LEFT BREAST SCAR;  Surgeon: Wallace Going, DO;  Location: [place];  Service: Plastics;  Laterality: Left;  . LATISSIMUS FLAP TO BREAST Right 01/30/2018    Procedure: RIGHT LATISSIMUS MYOCUTANEOUS FLAP FOR RIGHT CHEST WALL RECONSTRUCTION;  Surgeon: Wallace Going, DO;  Location: [place];  Service: Plastics;  Laterality: Right;  . MASTECTOMY W/ SENTINEL NODE BIOPSY Bilateral 03/16/2017   Procedure: BILATERAL TOTAL MASTECTOMY WITH LEFT SENTINEL LYMPH NODE BIOPSY;  Surgeon: Excell Seltzer, MD;  Location: [place];  Service: General;  Laterality: Bilateral;  . REMOVAL OF BILATERAL TISSUE EXPANDERS WITH PLACEMENT OF BILATERAL BREAST IMPLANTS Bilateral 06/15/2017   Procedure: REMOVAL OF BILATERAL TISSUE EXPANDERS WITH PLACEMENT OF BILATERAL BREAST IMPLANTS;  Surgeon: Wallace Going, DO;  Location: [place];  Service: Plastics;  Laterality: Bilateral;  . TOTAL MASTECTOMY Bilateral 03/16/2017   SENTINAL NODE BIOPSY    FAMILY HISTORY Family History  Problem Relation Age of Onset  . Hearing loss Mother   . Asthma Father   . Cancer Father        melanoma, metastases   . Diabetes Father   . Heart disease Father   . Early death Sister   . Early death Paternal Grandfather   . Cancer Maternal Grandmother 5  colon cancer   . Breast cancer Neg Hx   . Ovarian cancer Neg Hx   The patient's father died at age 101. He had been diagnosed with melanoma in 1 year. That was not the cause of death. The patient's mother is living at age 33 as of May 2018. The patient has one brother, no sisters. There is no history of breast or ovarian cancer in the family, and no other melanoma history.  GYNECOLOGIC HISTORY:  No LMP recorded. Patient has had a hysterectomy. 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 She frequently keeps her 2 grandchildren. Her husband Clair Gulling is a retired Airline pilot. He now owns a business where they prepare the front car of a trailer  hoarse hitch combination. Daughter April lives in [place] where she works as a Statistician. Daughter Ailene Ravel lives in [place] where she is an Forensic scientist for [employer]. The patient has 4 grandchildren. She is a Tourist information centre manager.    ADVANCED DIRECTIVES:    HEALTH MAINTENANCE: Social History   Tobacco Use  . Smoking status: Former Smoker    Packs/day: 0.50    Years: 40.00    Pack years: 20.00    Types: Cigarettes  . Smokeless tobacco: Never Used  Substance Use Topics  . Alcohol use: Yes    Alcohol/week: 0.0 standard drinks    Comment: social  . Drug use: No     Colonoscopy:  PAP:  Bone density:05/24/2016 showed a T score of -0.6 normal   Allergies  Allergen Reactions  . Penicillins Swelling, Rash and Other (See Comments)    Older sister died from reaction to penicillin Has patient had a PCN reaction causing immediate rash, facial/tongue/throat swelling, SOB or lightheadedness with hypotension: Yes Has patient had a PCN reaction causing severe rash involving mucus membranes or skin necrosis: No Has patient had a PCN reaction that required hospitalization: No Has patient had a PCN reaction occurring within the last 10 years: No If all of the above answers are "NO", then may proceed with Cephalosporin use.   . Sulfa Antibiotics Rash  . Levaquin [Levofloxacin] Nausea And Vomiting    Current Outpatient Medications  Medication Sig Dispense Refill  . ANORO ELLIPTA 62.5-25 MCG/INH AEPB INHALE 1 PUFF INTO THE LUNGS DAILY (Patient taking differently: Inhale 1 puff into the lungs at bedtime. ) 60 each 10  . Ascorbic Acid (VITAMIN C) 1000 MG tablet Take 1,000 mg by mouth 2 (two) times daily.     Marland Kitchen losartan (COZAAR) 100 MG tablet TAKE 1 TABLET BY MOUTH EVERY DAY 90 tablet 3  . montelukast (SINGULAIR) 10 MG tablet TAKE 1 TABLET BY MOUTH EVERY DAY 90 tablet 3  . Multiple Vitamin (MULTIVITAMIN WITH MINERALS) TABS tablet Take 1 tablet by mouth daily.    Marland Kitchen  neomycin-polymyxin-hydrocortisone (CORTISPORIN) OTIC solution Place 4 drops into the right ear 4 (four) times daily. 10 mL 1  . tamoxifen (NOLVADEX) 20 MG tablet Take 20 mg by mouth at bedtime.     . tamoxifen (NOLVADEX) 20 MG tablet TAKE 1 TABLET BY MOUTH EVERY DAY 30 tablet 38  . triamterene-hydrochlorothiazide (MAXZIDE-25) 37.5-25 MG tablet Take 1 tablet by mouth at bedtime. Stop amlodipine 30 tablet 11  . VENTOLIN HFA 108 (90 Base) MCG/ACT inhaler USE 1-2 PUFFS EVERY 6  HOURS AS NEEDED FOR WHEEZING 18 Inhaler 5   No current facility-administered medications for this visit.     OBJECTIVE: Middle-aged white woman in no acute distress  Vitals:   06/20/18 0948  BP: (!) 111/51  Pulse: (!) 101  Resp: 18  Temp: 98.5 F (36.9 C)  SpO2: 99%     Body mass index is 24.28 kg/m.    ECOG FS:1 - Symptomatic but completely ambulatory  Sclerae unicteric, EOMs intact Oropharynx clear and moist No cervical or supraclavicular adenopathy Lungs no rales or rhonchi Heart regular rate and rhythm Abd soft, nontender, positive bowel sounds MSK no focal spinal tenderness, no upper extremity lymphedema Neuro: nonfocal, well oriented, appropriate affect Breasts: Status post bilateral mastectomies and status post right latissimus flap.  The incisions have healed very nicely.  There is no evidence of chest wall recurrence.  Both axillae are benign.  LAB RESULTS:  CMP     Component Value Date/Time   NA 133 (L) 01/31/2018 0415   NA 137 07/13/2017 1035   K 4.0 01/31/2018 0415   K 4.3 07/13/2017 1035   CL 99 (L) 01/31/2018 0415   CO2 21 (L) 01/31/2018 0415   CO2 27 07/13/2017 1035   GLUCOSE 126 (H) 01/31/2018 0415   GLUCOSE 94 07/13/2017 1035   BUN 13 01/31/2018 0415   BUN 15.4 07/13/2017 1035   CREATININE 0.82 01/31/2018 0415   CREATININE 0.95 12/15/2017 1541   CREATININE 0.8 07/13/2017 1035   CALCIUM 8.3 (L) 01/31/2018 0415   CALCIUM 9.3 07/13/2017 1035   PROT 7.6 01/10/2018 1009   PROT 7.7  07/13/2017 1035   ALBUMIN 3.7 01/10/2018 1009   ALBUMIN 3.5 07/13/2017 1035   AST 21 01/10/2018 1009   AST 15 07/13/2017 1035   ALT 22 01/10/2018 1009   ALT 14 07/13/2017 1035   ALKPHOS 49 01/10/2018 1009   ALKPHOS 73 07/13/2017 1035   BILITOT 0.3 01/10/2018 1009   BILITOT 0.22 07/13/2017 1035   GFRNONAA >60 01/31/2018 0415   GFRNONAA 64 12/15/2017 1541   GFRAA >60 01/31/2018 0415   GFRAA 74 12/15/2017 1541    No results found for: Ronnald Ramp, A1GS, A2GS, BETS, BETA2SER, GAMS, MSPIKE, SPEI  No results found for: Nils Pyle, [facility]  Lab Results  Component Value Date   WBC 5.2 06/20/2018   NEUTROABS 3.2 06/20/2018   HGB 11.6 (L) 06/20/2018   HCT 36.2 06/20/2018   MCV 90.7 06/20/2018   PLT 152 06/20/2018      Chemistry      Component Value Date/Time   NA 133 (L) 01/31/2018 0415   NA 137 07/13/2017 1035   K 4.0 01/31/2018 0415   K 4.3 07/13/2017 1035   CL 99 (L) 01/31/2018 0415   CO2 21 (L) 01/31/2018 0415   CO2 27 07/13/2017 1035   BUN 13 01/31/2018 0415   BUN 15.4 07/13/2017 1035   CREATININE 0.82 01/31/2018 0415   CREATININE 0.95 12/15/2017 1541   CREATININE 0.8 07/13/2017 1035      Component Value Date/Time   CALCIUM 8.3 (L) 01/31/2018 0415   CALCIUM 9.3 07/13/2017 1035   ALKPHOS 49 01/10/2018 1009   ALKPHOS 73 07/13/2017 1035   AST 21 01/10/2018 1009   AST 15 07/13/2017 1035   ALT 22 01/10/2018 1009   ALT 14 07/13/2017 1035   BILITOT 0.3 01/10/2018 1009   BILITOT 0.22 07/13/2017 1035       No results found for: LABCA2  No components found for: [id number]  No results for input(s): INR in the last 168 hours.  Urinalysis No results found for: COLORURINE, APPEARANCEUR, LABSPEC, PHURINE, GLUCOSEU, HGBUR, BILIRUBINUR, KETONESUR, PROTEINUR, UROBILINOGEN, NITRITE, LEUKOCYTESUR   STUDIES: No results found.   ELIGIBLE FOR AVAILABLE RESEARCH PROTOCOL: no  ASSESSMENT: 62 y.o. [place] woman, with bilateral breast  cancer  (1) status post right lumpectomy and sentinel lymph node sampling 01/27/2016 for lobular carcinoma in situ, pleomorphic variant, with multiple foci of invasive lobular carcinoma, all less than a millimeter, but with negative margins and both sentinel lymph nodes negative, invasive disease being estrogen receptor positive, HER-2 not tested (pT1 [mic] pN0}  (2) adjuvant radiation 03/09/2016 through 04/19/2016 The patient initially received a dose of 50Gy in 52factions to the breast using whole-breast tangent fields. This was delivered using a 3-D conformal technique. The patient then received a boost to the seroma. This delivered an additional 10Gy in 558fctions using an en face electron field due to the depth of the seroma. The total dose was 60Gy.  (3) anastrozole started June 2017, discontinued July 2018  (4) status post left breast lower inner quadrant biopsy 12/29/2016 for a clinical T1c N0  invasive lobular breast cancer, grade 2, estrogen receptor positive, progesterone receptor negative HER-2 nonamplified, with a signals ratio 1.35 and the number per cell 2.70.  (5) intermediate Oncotype score of 22 predicts a 10 year risk of recurrence outside the breast of 14% if the patient's only systemic therapy is tamoxifen for 5 years.  (a) the patient opted against chemotherapy (TLovena Lex results confirm choice)  (6) status post bilateral mastectomies and left sentinel lymph node sampling 03/16/2017 showing  (a) on the right, no evidence of carcinoma  (b) on the left, residual pT2 pN0, stage IIA invasive lobular carcinoma, grade 2, with negative margins.  (7) had immediate expander placement at the time of bilateral mastectomies  (a) definitive implant placement with bilateral capsulotomies June 15, 2017, with benign pathology  (b) implants removed February 2019 because of chest discomfort  (c) status post right latissimus flap reconstruction and left capsulotomy 01/30/2018  with benign pathology  (8) started tamoxifen 04/30/2017  PLAN: MaRiellys now a little over a year out from definitive surgery for her breast cancer with no evidence of disease recurrence.  This is favorable.  She is tolerating tamoxifen well and the plan will be to continue that most likely to a total of 10 years.  She is accepting her chest wall as it is and is not anticipating any further reconstruction.  She understands her only possibility from this point would be [place] She really does not want to pursue this.  She is obtaining lighter weight prostheses and already has adequate bras.  She will follow-up with her primary care physician and also with Dr. DiMarla Roeext June.  Accordingly she will see me a year from now.  She knows to call for any other issues that may develop before the next visit.    Magrinat, GuVirgie DadMD  06/20/18 10:29 AM Medical Oncology [facility] H[facility]vLong[place] [address]. [phone]  Fax. [phone]  I, KaWilburn Mylarm acting as scribe for Dr. GuVirgie DadMagrinat.  I, GuLurline DelD, have reviewed the above documentation for accuracy and completeness, and I agree with the above.

## 2018-06-20 NOTE — Telephone Encounter (Signed)
Gave patient avs and calendar.   °

## 2018-06-21 ENCOUNTER — Encounter: Payer: Self-pay | Admitting: Physical Therapy

## 2018-06-21 ENCOUNTER — Ambulatory Visit: Payer: 59 | Admitting: Physical Therapy

## 2018-06-21 DIAGNOSIS — M25611 Stiffness of right shoulder, not elsewhere classified: Secondary | ICD-10-CM | POA: Diagnosis not present

## 2018-06-21 DIAGNOSIS — R293 Abnormal posture: Secondary | ICD-10-CM

## 2018-06-21 DIAGNOSIS — M6281 Muscle weakness (generalized): Secondary | ICD-10-CM

## 2018-06-21 DIAGNOSIS — H26492 Other secondary cataract, left eye: Secondary | ICD-10-CM | POA: Diagnosis not present

## 2018-06-21 DIAGNOSIS — M25511 Pain in right shoulder: Secondary | ICD-10-CM

## 2018-06-21 DIAGNOSIS — L599 Disorder of the skin and subcutaneous tissue related to radiation, unspecified: Secondary | ICD-10-CM

## 2018-06-21 DIAGNOSIS — M25612 Stiffness of left shoulder, not elsewhere classified: Secondary | ICD-10-CM

## 2018-06-21 NOTE — Therapy (Signed)
[place], [place], [address] Phone: [phone]   Fax:  [phone]  Physical Therapy Treatment  Patient Details  Name: Krystal Brooks MRN: [id number] Date of Birth: 31-Mar-1956 Referring Provider (PT): Dr. Marla Roe    Encounter Date: 06/21/2018  PT End of Session - 06/21/18 1229    Visit Number  11    Number of Visits  21    Date for PT Re-Evaluation  07/23/18    PT Start Time  1100    PT Stop Time  1145    PT Time Calculation (min)  45 min    Activity Tolerance  Patient tolerated treatment well    Behavior During Therapy  [facility] for tasks assessed/performed       Past Medical History:  Diagnosis Date  . Adie's pupil    LEFT EYE  . Allergy   . Asthma   . Atherosclerosis of aorta (HCC)    bulge above renal arteries on mri 2016  . Cancer [facility]) 2017   right breast ca  . Cellulitis    chest  . Complication of anesthesia    VERY HARD TO WAKE UP    . Hypertension   . Personal history of radiation therapy   . PONV (postoperative nausea and vomiting)   . Smoker     Past Surgical History:  Procedure Laterality Date  . ABDOMINAL HYSTERECTOMY    . BREAST BIOPSY Right 01/02/2016  . BREAST EXCISIONAL BIOPSY Right 1979  . BREAST IMPLANT REMOVAL Bilateral 10/05/2017   Procedure: VFIEPPIRJ BILATERAL BREAST IMPLANTS;  Surgeon: Wallace Going, DO;  Location: [place];  Service: Plastics;  Laterality: Bilateral;  . BREAST LUMPECTOMY Right 01/27/2016  . BREAST LUMPECTOMY WITH RADIOACTIVE SEED AND SENTINEL LYMPH NODE BIOPSY Right 01/27/2016   Procedure: BREAST LUMPECTOMY WITH RADIOACTIVE SEED AND SENTINEL LYMPH NODE BIOPSY;  Surgeon: Excell Seltzer, MD;  Location: [place];  Service: General;  Laterality: Right;  . BREAST RECONSTRUCTION WITH PLACEMENT OF TISSUE EXPANDER AND FLEX HD (ACELLULAR HYDRATED DERMIS) Bilateral 03/16/2017   Procedure: BILATERAL IMMEDIATE  BREAST RECONSTRUCTION WITH PLACEMENT OF TISSUE EXPANDER AND FLEX HD (ACELLULAR HYDRATED DERMIS);  Surgeon: Wallace Going, DO;  Location: [place];  Service: Plastics;  Laterality: Bilateral;  . BREAST SURGERY  1979   cyst removed rt breast  . CATARACT EXTRACTION W/ INTRAOCULAR LENS IMPLANT     RT EYE  . CESAREAN SECTION     x2  . EXCISION OF BREAST LESION Left 01/30/2018   Procedure: EXCISION OF LEFT BREAST SCAR;  Surgeon: Wallace Going, DO;  Location: [place];  Service: Plastics;  Laterality: Left;  . LATISSIMUS FLAP TO BREAST Right 01/30/2018   Procedure: RIGHT LATISSIMUS MYOCUTANEOUS FLAP FOR RIGHT CHEST WALL RECONSTRUCTION;  Surgeon: Wallace Going, DO;  Location: [place];  Service: Plastics;  Laterality: Right;  . MASTECTOMY W/ SENTINEL NODE BIOPSY Bilateral 03/16/2017   Procedure: BILATERAL TOTAL MASTECTOMY WITH LEFT SENTINEL LYMPH NODE BIOPSY;  Surgeon: Excell Seltzer, MD;  Location: [place];  Service: General;  Laterality: Bilateral;  . REMOVAL OF BILATERAL TISSUE EXPANDERS WITH PLACEMENT OF BILATERAL BREAST IMPLANTS Bilateral 06/15/2017   Procedure: REMOVAL OF BILATERAL TISSUE EXPANDERS WITH PLACEMENT OF BILATERAL BREAST IMPLANTS;  Surgeon: Wallace Going, DO;  Location: [place];  Service: Plastics;  Laterality: Bilateral;  . TOTAL MASTECTOMY Bilateral 03/16/2017   SENTINAL NODE BIOPSY    There were no vitals filed for this visit.  Subjective Assessment - 06/21/18 1106    Subjective  Pt states that she had muscle pain at anterior ribs on both sides after exercise last week. It lasted a few days and then went away.  Otherwise , she says she is feeling well.     Pertinent History  hx of bilateral breast cancer; bilateral mastectomy with implants 06/15/17 with removal of implants in February and had cellulitis at on one of the drain sights and had diffculty with the medications and latissimus flap 01/30/18 due to persistent wound on the Rt.  Radiation to  the Rt breast.      Patient Stated Goals  not have the spasms and not feel as tight      Currently in Pain?  No/denies                       [facility] Adult PT Treatment/Exercise - 06/21/18 0001      Exercises   Exercises  Shoulder;Other Exercises    Other Exercises   Instructed in background of [place] program and demonstrated/ practiced all exercises from stretches through standing rows.       Shoulder Exercises: Standing   Other Standing Exercises  both pinky fingers up the wall, standing in "W" postition and elbow retraction with thoracic twist, modified downward dog at wall              PT Education - 06/21/18 1228    Education Details  strength ABC program     Person(s) Educated  Patient    Methods  Explanation;Demonstration    Comprehension  Need further instruction          PT Long Term Goals - 05/23/18 1117      PT LONG TERM GOAL #1   Title  Pt will decrease QDASH to 20% or less     Status  On-going      PT LONG TERM GOAL #2   Title  Pt will perform Rt shoulder horizontal adduction without muscle spasm    Baseline  she sill has occasional muscle spasms during household chores. 05/23/2018     Status  Partially Met      PT LONG TERM GOAL #3   Title  Pt will be able to shut the car door without increased pain or muscle spasm    Baseline  most of the time . still has trouble reaching back for the seat belt  (05/23/2018)     Status  Partially Met      PT LONG TERM GOAL #4   Title  Pt will decrease chest tightness by at least 25%     Status  Achieved      PT LONG TERM GOAL #5   Title  Pt will be ind with strength ABC program for continued strength and mobility     Status  On-going            Plan - 06/21/18 1229    Clinical Impression Statement  Pt is doing well and is ready to progress to generalized home program that she can continue on at home indefinitely for generalized fitness.  Began instruction in Strength ABC     PT  Treatment/Interventions  ADLs/Self Care Home Management;Therapeutic exercise;Patient/family education;Neuromuscular re-education;Manual techniques;Scar mobilization;Passive range of motion    PT Next Visit Plan   Review Strength ABC and  continue with serratus strengthening and work on shoulder strength in sidelying   focus on soft tissue work with thick massage  cream to posterior,lateral and anterior progress  scapular and mid back strength due to lat flap    Consulted and Agree with Plan of Care  Patient       Patient will benefit from skilled therapeutic intervention in order to improve the following deficits and impairments:     Visit Diagnosis: Stiffness of right shoulder, not elsewhere classified  Muscle weakness (generalized)  Disorder of the skin and subcutaneous tissue related to radiation, unspecified  Abnormal posture  Stiffness of left shoulder, not elsewhere classified  Acute pain of right shoulder     Problem List Patient Active Problem List   Diagnosis Date Noted  . Acquired absence of right breast 01/30/2018  . Status post bilateral breast reconstruction 06/24/2017  . Acquired absence of bilateral breasts and nipples 03/22/2017  . Breast cancer ([place]) 03/16/2017  . History of breast cancer in female 01/27/2017  . Malignant neoplasm of upper-inner quadrant of left breast in female, estrogen receptor positive ([place]) 01/03/2017  . HTN (hypertension) 06/14/2016  . Malignant neoplasm of lower-inner quadrant of right breast of female, estrogen receptor positive ([place]) 01/07/2016  . Atherosclerosis of aorta ([place])   . Smoker    Donato Heinz. Owens Shark PT  Norwood Levo 06/21/2018, 12:32 PM  [place], [place], [address] Phone: [phone]   Fax:  [phone]  Name: Krystal Brooks MRN: [id number] Date of Birth: February 26, 1956

## 2018-06-27 ENCOUNTER — Ambulatory Visit: Payer: 59 | Admitting: Physical Therapy

## 2018-06-27 DIAGNOSIS — M6281 Muscle weakness (generalized): Secondary | ICD-10-CM

## 2018-06-27 DIAGNOSIS — L599 Disorder of the skin and subcutaneous tissue related to radiation, unspecified: Secondary | ICD-10-CM

## 2018-06-27 DIAGNOSIS — R293 Abnormal posture: Secondary | ICD-10-CM

## 2018-06-27 DIAGNOSIS — M25611 Stiffness of right shoulder, not elsewhere classified: Secondary | ICD-10-CM | POA: Diagnosis not present

## 2018-06-27 NOTE — Therapy (Signed)
[place], [place], [address] Phone: [phone]   Fax:  [phone]  Physical Therapy Treatment  Patient Details  Name: Krystal Brooks MRN: [id number] Date of Birth: [birth year] Referring Provider (PT): Dr. Marla Roe    Encounter Date: 06/27/2018  PT End of Session - 06/27/18 1144    Visit Number  12    Number of Visits  21    Date for PT Re-Evaluation  07/23/18    PT Start Time  3646    PT Stop Time  1100    PT Time Calculation (min)  45 min    Activity Tolerance  Patient tolerated treatment well    Behavior During Therapy  [facility] for tasks assessed/performed       Past Medical History:  Diagnosis Date  . Adie's pupil    LEFT EYE  . Allergy   . Asthma   . Atherosclerosis of aorta (HCC)    bulge above renal arteries on mri 2016  . Cancer [facility]) 2017   right breast ca  . Cellulitis    chest  . Complication of anesthesia    VERY HARD TO WAKE UP    . Hypertension   . Personal history of radiation therapy   . PONV (postoperative nausea and vomiting)   . Smoker     Past Surgical History:  Procedure Laterality Date  . ABDOMINAL HYSTERECTOMY    . BREAST BIOPSY Right 01/02/2016  . BREAST EXCISIONAL BIOPSY Right 1979  . BREAST IMPLANT REMOVAL Bilateral 10/05/2017   Procedure: [id number] BILATERAL BREAST IMPLANTS;  Surgeon: Wallace Going, DO;  Location: [place];  Service: Plastics;  Laterality: Bilateral;  . BREAST LUMPECTOMY Right 01/27/2016  . BREAST LUMPECTOMY WITH RADIOACTIVE SEED AND SENTINEL LYMPH NODE BIOPSY Right 01/27/2016   Procedure: BREAST LUMPECTOMY WITH RADIOACTIVE SEED AND SENTINEL LYMPH NODE BIOPSY;  Surgeon: Excell Seltzer, MD;  Location: [place];  Service: General;  Laterality: Right;  . BREAST RECONSTRUCTION WITH PLACEMENT OF TISSUE EXPANDER AND FLEX HD (ACELLULAR HYDRATED DERMIS) Bilateral 03/16/2017   Procedure: BILATERAL IMMEDIATE  BREAST RECONSTRUCTION WITH PLACEMENT OF TISSUE EXPANDER AND FLEX HD (ACELLULAR HYDRATED DERMIS);  Surgeon: Wallace Going, DO;  Location: [place];  Service: Plastics;  Laterality: Bilateral;  . BREAST SURGERY  1979   cyst removed rt breast  . CATARACT EXTRACTION W/ INTRAOCULAR LENS IMPLANT     RT EYE  . CESAREAN SECTION     x2  . EXCISION OF BREAST LESION Left 01/30/2018   Procedure: EXCISION OF LEFT BREAST SCAR;  Surgeon: Wallace Going, DO;  Location: [place];  Service: Plastics;  Laterality: Left;  . LATISSIMUS FLAP TO BREAST Right 01/30/2018   Procedure: RIGHT LATISSIMUS MYOCUTANEOUS FLAP FOR RIGHT CHEST WALL RECONSTRUCTION;  Surgeon: Wallace Going, DO;  Location: [place];  Service: Plastics;  Laterality: Right;  . MASTECTOMY W/ SENTINEL NODE BIOPSY Bilateral 03/16/2017   Procedure: BILATERAL TOTAL MASTECTOMY WITH LEFT SENTINEL LYMPH NODE BIOPSY;  Surgeon: Excell Seltzer, MD;  Location: [place];  Service: General;  Laterality: Bilateral;  . REMOVAL OF BILATERAL TISSUE EXPANDERS WITH PLACEMENT OF BILATERAL BREAST IMPLANTS Bilateral 06/15/2017   Procedure: REMOVAL OF BILATERAL TISSUE EXPANDERS WITH PLACEMENT OF BILATERAL BREAST IMPLANTS;  Surgeon: Wallace Going, DO;  Location: [place];  Service: Plastics;  Laterality: Bilateral;  . TOTAL MASTECTOMY Bilateral 03/16/2017   SENTINAL NODE BIOPSY    There were no vitals filed for this visit.  Subjective Assessment - 06/27/18 1142    Subjective  Pt reports she is doing well with no pain  She has had a bad cold since last week with lots of upper chest congestion.  She says this is her last scheduled treatment and she is ready to discharge from PT.  She feels like she can do the stretngth ABC program at home     Pertinent History  hx of bilateral breast cancer; bilateral mastectomy with implants 06/15/17 with removal of implants in February and had cellulitis at on one of the drain sights and had diffculty with the  medications and latissimus flap 01/30/18 due to persistent wound on the Rt.  Radiation to the Rt breast.      Patient Stated Goals  not have the spasms and not feel as tight      Currently in Pain?  No/denies         [facility] PT Assessment - 06/27/18 0001      Observation/Other Assessments   Quick DASH   6.82           Quick Dash - 06/27/18 0001    Open a tight or new jar  No difficulty    Do heavy household chores (wash walls, wash floors)  No difficulty    Carry a shopping bag or briefcase  No difficulty    Wash your back  Mild difficulty    Use a knife to cut food  No difficulty    Recreational activities in which you take some force or impact through your arm, shoulder, or hand (golf, hammering, tennis)  Mild difficulty    During the past week, to what extent has your arm, shoulder or hand problem interfered with your normal social activities with family, friends, neighbors, or groups?  Not at all    During the past week, to what extent has your arm, shoulder or hand problem limited your work or other regular daily activities  Not at all    Arm, shoulder, or hand pain.  Mild    Tingling (pins and needles) in your arm, shoulder, or hand  None    Difficulty Sleeping  No difficulty    DASH Score  6.82 %             [facility] Adult PT Treatment/Exercise - 06/27/18 0001      Exercises   Exercises  Shoulder;Lumbar    Other Exercises   pt reports she is independent in the Strength ABC       Lumbar Exercises: Stretches   Other Lumbar Stretch Exercise  seated side bending stretch to the left with cues to keep right hip grounded down to mat       Lumbar Exercises: Aerobic   Tread Mill  1.5 mph for 2 minutes and 2.0 for minutes without difficulty.  Pt has decreased O2 sat to 85 with HR 102 pt with no symptoms She was informed of this but thinks it is becasue she has a bad cold today and has been coughing up lots of mucus.  If  cold continues and she is concerned she will contact her  PCP.       Lumbar Exercises: Supine   Pelvic Tilt  5 reps    Pelvic Tilt Limitations  5 reps with perineum pulled in for pelvic floor activation     Clam  10 reps   verbal cues to keep core stable    Bent Knee Raise  5 reps    Bent  Knee Raise Limitations  pt cued to keep core stable.  Pt adavanced to one leg to tabletop, then the other and then down with one then the other and then advanced to single leg extended out with other leg in tabletop and core stable     Bridge  10 reps    Other Supine Lumbar Exercises  lower trunk rotation with arms in goal post position pt with stretch in right axilla and anterior chest       Shoulder Exercises: Stretch   Other Shoulder Stretches  arms open wide in horizontl abduction              PT Education - 06/27/18 1143    Education Details  exercise programs at the [facility] and through the [facility]) Educated  Patient    Methods  Explanation    Comprehension  Verbalized understanding          PT Long Term Goals - 06/27/18 1059      PT LONG TERM GOAL #1   Title  Pt will decrease QDASH to 20% or less     Baseline  6.82% on 06/27/2018    Status  Achieved      PT LONG TERM GOAL #2   Title  Pt will perform Rt shoulder horizontal adduction without muscle spasm    Baseline  pt can perform without spasm     Status  Achieved      PT LONG TERM GOAL #3   Title  Pt will be able to shut the car door without increased pain or muscle spasm    Baseline  pt states she can do this now     Status  Achieved      PT LONG TERM GOAL #4   Title  Pt will decrease chest tightness by at least 25%     Baseline  pt says this is achieved     Status  Achieved      PT LONG TERM GOAL #5   Title  Pt will be ind with strength ABC program for continued strength and mobility     Status  Achieved            Plan - 06/27/18 1145    Clinical Impression Statement  Pt feels that she is doing ok and is ready to discharge from  PT.  She still has pain and tightness in her right chest and axilla, but feels she will probably always have some. She understands that she needs to exercise more regularly at home to keep feeling better.     Rehab Potential  Excellent    Clinical Impairments Affecting Rehab Potential  previous radiation and multiple surgeries of right chest     PT Next Visit Plan  discharge this episode     Consulted and Agree with Plan of Care  Patient       Patient will benefit from skilled therapeutic intervention in order to improve the following deficits and impairments:  Increased fascial restricitons, Decreased mobility, Decreased scar mobility, Increased muscle spasms, Decreased range of motion, Decreased strength, Impaired UE functional use  Visit Diagnosis: Stiffness of right shoulder, not elsewhere classified  Muscle weakness (generalized)  Disorder of the skin and subcutaneous tissue related to radiation, unspecified  Abnormal posture     Problem List Patient Active Problem List   Diagnosis Date Noted  . Acquired absence of right breast 01/30/2018  . Status post bilateral breast reconstruction  06/24/2017  . Acquired absence of bilateral breasts and nipples 03/22/2017  . Breast cancer ([place]) 03/16/2017  . History of breast cancer in female 01/27/2017  . Malignant neoplasm of upper-inner quadrant of left breast in female, estrogen receptor positive ([place]) 01/03/2017  . HTN (hypertension) 06/14/2016  . Malignant neoplasm of lower-inner quadrant of right breast of female, estrogen receptor positive ([place]) 01/07/2016  . Atherosclerosis of aorta ([place])   . Smoker    PHYSICAL THERAPY DISCHARGE SUMMARY  Visits from Start of Care: 12  Current functional level related to goals / functional outcomes: Pt states she has had improvement in activities with less pain     Remaining deficits: Intermittent pain    Education / Equipment: Home exercise program  Plan: Patient agrees to discharge.   Patient goals were met. Patient is being discharged due to being pleased with the current functional level.  ?????   Donato Heinz. Owens Shark PT  Norwood Levo 06/27/2018, 11:49 AM  [place], [place], [address] Phone: [phone]   Fax:  [phone]  Name: Krystal Brooks MRN: [id number] Date of Birth: 04/29/1956

## 2018-06-27 NOTE — Patient Instructions (Addendum)
  PELVIC TILT  Lie on back, legs bent. Exhale, tilting top of pelvis back, pubic bone up, to flatten lower back. Inhale, rolling pelvis opposite way, top forward, pubic bone down, arch in back. Repeat __10__ times. Do __2__ sessions per day. Copyright  VHI. All rights reserved.     Lie with hips and knees bent. Slowly inhale, and then exhale. Pull navel toward spine and tighten pelvic floor. Hold for __10_ seconds. Continue to breathe in and out during hold. Rest for _10__ seconds. Repeat __10_ times. Do __2-3_ times a day.   Copyright  VHI. All rights reserved.  Knee Fold   Lie on back, legs bent, arms by sides. Exhale, lifting knee to chest. Inhale, returning. Keep abdominals flat, navel to spine. Repeat __10__ times, alternating legs. Do __2__ sessions per day.  Copyright  VHI. All rights reserved.  Knee Drop   Keep pelvis stable. Without rotating hips, slowly drop knee to side, pause, return to center, bring knee across midline toward opposite hip. Feel obliques engaging. Repeat for ___10_ times each leg.  Copyright  VHI. All rights reserved.  Isometric Hold With Pelvic Floor (Hook-Lying)    Copyright  VHI. All rights reserved.  Heel Slide to Straight   Slide one leg down to straight. Return. Be sure pelvis does not rock forward, tilt, rotate, or tip to side. Do _10__ times. Restabilize pelvis. Repeat with other leg. Do __1-2_ sets, __2_ times per day.  http://ss.exer.us/16   Copyright  VHI. All rights reserved.    Teapot stretch :  Reach overhead for a sidebend.  Keep your hip down.  Doorway stretch:  Arm up on a door jamb and stretch

## 2018-07-04 ENCOUNTER — Ambulatory Visit (INDEPENDENT_AMBULATORY_CARE_PROVIDER_SITE_OTHER): Payer: 59 | Admitting: Family Medicine

## 2018-07-04 ENCOUNTER — Encounter: Payer: Self-pay | Admitting: Family Medicine

## 2018-07-04 VITALS — BP 150/84 | HR 104 | Temp 97.8°F | Resp 16 | Ht 61.0 in | Wt 129.0 lb

## 2018-07-04 DIAGNOSIS — J329 Chronic sinusitis, unspecified: Secondary | ICD-10-CM

## 2018-07-04 DIAGNOSIS — R0902 Hypoxemia: Secondary | ICD-10-CM | POA: Diagnosis not present

## 2018-07-04 MED ORDER — AZITHROMYCIN 250 MG PO TABS: ORAL_TABLET | ORAL | 0 refills | Status: DC

## 2018-07-04 MED ORDER — PREDNISONE 20 MG PO TABS
ORAL_TABLET | ORAL | 0 refills | Status: DC
Start: 2018-07-04 — End: 2018-11-03

## 2018-07-04 NOTE — Progress Notes (Signed)
Subjective:    Patient ID: Krystal Brooks, female    DOB: Jan 02, 1956, 62 y.o.   MRN: [id number]  HPI 06/12/15 I saw the patient in early September. At that time I gave her a prednisone taper pack. I presume to be asthmatic bronchitis/asthma exacerbation. Symptoms improved but never completely went away. She took an additional prednisone pack shortly thereafter. After taking this prednisone, she developed myalgias, body aches, fevers, chill, and worsening chest congestion. She was seen in urgent care and diagnosed with walking pneumonia and was given clarithromycin and prednisone. She continues to have chest congestion, shortness of breath, dyspnea on exertion, and wheezing. However she states that she is much better she is still not back to normal. She also continues to have a productive cough. Patient is a 30+ pack year smoker with an underlying history of moderate persistent asthma. She's also developed a cold sore on her upper lip.  At that time, my plan was: I believe the patient has chronic bronchitis. I believe this is a sign that her asthma is evolving more into a COPD from her smoking. I have recommended smoking cessation. She is already trying her best and is taking Chantix. She has not smoked in 2 weeks. I want the patient to immediately go get a chest x-ray to rule out other pathology. I performed pulmonary function test today in office. FEV1 to FVC ratio of 61%. FEV1 is 1.1 L which is 50% of predicted. This qualifies the patient for borderline stage III COPD. Therefore the patient to discontinue dulera and begin anoro 1 inh a day.  Recheck in one week if no better or sooner if worse  07/04/18 Since that visit, the patient has quit smoking.  She has been cigarette free for 1 year.  She is compliant with her [place].  Recently 2 problems have arisen.  First she reports 3 weeks of pain and pressure in both frontal and both maxillary sinuses.  She reports postnasal drip.  She reports head congestion.   She reports constant rhinorrhea.  She reports subjective fevers.  This is exacerbating her underlying asthma/COPD and causing her to cough and wheeze more.  The cough is productive of green and yellow sputum.  However the majority of her complaint is in her sinuses.  Second issue however is she was found recently to be hypoxic although asymptomatic.  She is going to rehab.  Recently they checked a pulse oximetry while having her exercise on a treadmill and her pulse oximetry was as low as 82% on room air.  Here today she is 98% on room air however with ambulation at 120 feet, the patient's pulse oximetry drops to 84%.  She is asymptomatic with this.  She denies any chest pain.  She denies any orthopnea.  She denies any paroxysmal nocturnal dyspnea.  She has underlying stage III COPD. Past Medical History:  Diagnosis Date  . Adie's pupil    LEFT EYE  . Allergy   . Asthma   . Atherosclerosis of aorta (HCC)    bulge above renal arteries on mri 2016  . Cancer [facility]) 2017   right breast ca  . Cellulitis    chest  . Complication of anesthesia    VERY HARD TO WAKE UP    . Hypertension   . Personal history of radiation therapy   . PONV (postoperative nausea and vomiting)   . Smoker    Past Surgical History:  Procedure Laterality Date  . ABDOMINAL HYSTERECTOMY    .  BREAST BIOPSY Right 01/02/2016  . BREAST EXCISIONAL BIOPSY Right 1979  . BREAST IMPLANT REMOVAL Bilateral 10/05/2017   Procedure: [id number] BILATERAL BREAST IMPLANTS;  Surgeon: Wallace Going, DO;  Location: [place];  Service: Plastics;  Laterality: Bilateral;  . BREAST LUMPECTOMY Right 01/27/2016  . BREAST LUMPECTOMY WITH RADIOACTIVE SEED AND SENTINEL LYMPH NODE BIOPSY Right 01/27/2016   Procedure: BREAST LUMPECTOMY WITH RADIOACTIVE SEED AND SENTINEL LYMPH NODE BIOPSY;  Surgeon: Excell Seltzer, MD;  Location: [place];  Service: General;  Laterality: Right;  . BREAST RECONSTRUCTION WITH  PLACEMENT OF TISSUE EXPANDER AND FLEX HD (ACELLULAR HYDRATED DERMIS) Bilateral 03/16/2017   Procedure: BILATERAL IMMEDIATE BREAST RECONSTRUCTION WITH PLACEMENT OF TISSUE EXPANDER AND FLEX HD (ACELLULAR HYDRATED DERMIS);  Surgeon: Wallace Going, DO;  Location: [place];  Service: Plastics;  Laterality: Bilateral;  . BREAST SURGERY  1979   cyst removed rt breast  . CATARACT EXTRACTION W/ INTRAOCULAR LENS IMPLANT     RT EYE  . CESAREAN SECTION     x2  . EXCISION OF BREAST LESION Left 01/30/2018   Procedure: EXCISION OF LEFT BREAST SCAR;  Surgeon: Wallace Going, DO;  Location: [place];  Service: Plastics;  Laterality: Left;  . LATISSIMUS FLAP TO BREAST Right 01/30/2018   Procedure: RIGHT LATISSIMUS MYOCUTANEOUS FLAP FOR RIGHT CHEST WALL RECONSTRUCTION;  Surgeon: Wallace Going, DO;  Location: [place];  Service: Plastics;  Laterality: Right;  . MASTECTOMY W/ SENTINEL NODE BIOPSY Bilateral 03/16/2017   Procedure: BILATERAL TOTAL MASTECTOMY WITH LEFT SENTINEL LYMPH NODE BIOPSY;  Surgeon: Excell Seltzer, MD;  Location: [place];  Service: General;  Laterality: Bilateral;  . REMOVAL OF BILATERAL TISSUE EXPANDERS WITH PLACEMENT OF BILATERAL BREAST IMPLANTS Bilateral 06/15/2017   Procedure: REMOVAL OF BILATERAL TISSUE EXPANDERS WITH PLACEMENT OF BILATERAL BREAST IMPLANTS;  Surgeon: Wallace Going, DO;  Location: [place];  Service: Plastics;  Laterality: Bilateral;  . TOTAL MASTECTOMY Bilateral 03/16/2017   SENTINAL NODE BIOPSY   Current Outpatient Medications on File Prior to Visit  Medication Sig Dispense Refill  . ANORO ELLIPTA 62.5-25 MCG/INH AEPB INHALE 1 PUFF INTO THE LUNGS DAILY (Patient taking differently: Inhale 1 puff into the lungs at bedtime. ) 60 each 10  . Ascorbic Acid (VITAMIN C) 1000 MG tablet Take 1,000 mg by mouth 2 (two) times daily.     Marland Kitchen losartan (COZAAR) 100 MG tablet TAKE 1 TABLET BY MOUTH EVERY DAY 90 tablet 3  . montelukast (SINGULAIR) 10 MG  tablet TAKE 1 TABLET BY MOUTH EVERY DAY 90 tablet 3  . Multiple Vitamin (MULTIVITAMIN WITH MINERALS) TABS tablet Take 1 tablet by mouth daily.    . tamoxifen (NOLVADEX) 20 MG tablet Take 1 tablet (20 mg total) by mouth at bedtime. 90 tablet 4  . triamterene-hydrochlorothiazide (MAXZIDE-25) 37.5-25 MG tablet Take 1 tablet by mouth at bedtime. Stop amlodipine 30 tablet 11  . VENTOLIN HFA 108 (90 Base) MCG/ACT inhaler USE 1-2 PUFFS EVERY 6 HOURS AS NEEDED FOR WHEEZING 18 Inhaler 5   No current facility-administered medications on file prior to visit.    Allergies  Allergen Reactions  . Penicillins Swelling, Rash and Other (See Comments)    Older sister died from reaction to penicillin Has patient had a PCN reaction causing immediate rash, facial/tongue/throat swelling, SOB or lightheadedness with hypotension: Yes Has patient had a PCN reaction causing severe rash involving mucus membranes or skin necrosis: No Has patient had a PCN reaction that required hospitalization: No  Has patient had a PCN reaction occurring within the last 10 years: No If all of the above answers are "NO", then may proceed with Cephalosporin use.   . Sulfa Antibiotics Rash  . Levaquin [Levofloxacin] Nausea And Vomiting   Social History   Socioeconomic History  . Marital status: Married    Spouse name: Not on file  . Number of children: Not on file  . Years of education: Not on file  . Highest education level: Not on file  Occupational History  . Not on file  Social Needs  . Financial resource strain: Not on file  . Food insecurity:    Worry: Not on file    Inability: Not on file  . Transportation needs:    Medical: Not on file    Non-medical: Not on file  Tobacco Use  . Smoking status: Former Smoker    Packs/day: 0.50    Years: 40.00    Pack years: 20.00    Types: Cigarettes  . Smokeless tobacco: Never Used  Substance and Sexual Activity  . Alcohol use: Yes    Alcohol/week: 0.0 standard drinks     Comment: social  . Drug use: No  . Sexual activity: Yes    Birth control/protection: Surgical    Comment: hysterectomy  Lifestyle  . Physical activity:    Days per week: Not on file    Minutes per session: Not on file  . Stress: Not on file  Relationships  . Social connections:    Talks on phone: Not on file    Gets together: Not on file    Attends religious service: Not on file    Active member of club or organization: Not on file    Attends meetings of clubs or organizations: Not on file    Relationship status: Not on file  . Intimate partner violence:    Fear of current or ex partner: Not on file    Emotionally abused: Not on file    Physically abused: Not on file    Forced sexual activity: Not on file  Other Topics Concern  . Not on file  Social History Narrative  . Not on file      Review of Systems  All other systems reviewed and are negative.      Objective:   Physical Exam  Constitutional: She appears well-developed and well-nourished.  HENT:  Right Ear: Tympanic membrane, external ear and ear canal normal.  Left Ear: Tympanic membrane and ear canal normal.  Nose: Mucosal edema and rhinorrhea present. Right sinus exhibits maxillary sinus tenderness and frontal sinus tenderness. Left sinus exhibits maxillary sinus tenderness and frontal sinus tenderness.  Mouth/Throat: Oropharynx is clear and moist. No oropharyngeal exudate.  Neck: Neck supple.  Cardiovascular: Normal rate, regular rhythm and normal heart sounds.  Pulmonary/Chest: Effort normal. No respiratory distress. She has decreased breath sounds. She has no wheezes. She has no rales.  Abdominal: Soft. Bowel sounds are normal. She exhibits no distension. There is no tenderness. There is no rebound.  Skin: No rash noted.  Vitals reviewed.         Assessment & Plan:  Hypoxia - Plan: DG Chest 2 View  Rhinosinusitis  I believe the patient has developed a secondary sinus infection after 3 weeks that  is likely causing postnasal drip and exacerbating her underlying PD causing her cough productive of purulent sputum.  I will treat her sinus infection with a Z-Pak given her allergy profile to penicillin and fluoroquinolones coupled  with a prednisone taper pack given her decreased breath sounds and underlying COPD.  She is already compliant with her Anoro.  Given her recent finding of underlying hypoxia with exercise, I will obtain a chest x-ray.  If chest x-ray is normal, I would recommend repeating ambulatory pulse oximetry in 1 month once the patient hopefully has recover from this infection.  If the patient continues to have hypoxia with exertion, the patient may qualify for oxygen with exercise.  At the present time however she states she does not feel like she needs it.  She did not even know there was a problem until someone checked her pulse oximetry.

## 2018-09-17 ENCOUNTER — Other Ambulatory Visit: Payer: Self-pay | Admitting: Family Medicine

## 2018-10-30 DIAGNOSIS — J0140 Acute pansinusitis, unspecified: Secondary | ICD-10-CM | POA: Diagnosis not present

## 2018-10-30 DIAGNOSIS — I10 Essential (primary) hypertension: Secondary | ICD-10-CM | POA: Diagnosis not present

## 2018-11-03 ENCOUNTER — Encounter: Payer: Self-pay | Admitting: Family Medicine

## 2018-11-03 ENCOUNTER — Ambulatory Visit: Payer: 59 | Admitting: Family Medicine

## 2018-11-03 VITALS — BP 146/90 | HR 99 | Temp 97.8°F | Resp 16 | Ht 61.0 in | Wt 130.0 lb

## 2018-11-03 DIAGNOSIS — H8110 Benign paroxysmal vertigo, unspecified ear: Secondary | ICD-10-CM | POA: Diagnosis not present

## 2018-11-03 MED ORDER — MECLIZINE HCL 25 MG PO TABS: 25.0000 mg | ORAL_TABLET | Freq: Three times a day (TID) | ORAL | 0 refills | Status: DC | PRN

## 2018-11-03 NOTE — Progress Notes (Signed)
Subjective:    Patient ID: Krystal Brooks, female    DOB: 1956-01-27, 63 y.o.   MRN: [id number]  HPI  For the last 2 to 3 days, the patient has been experiencing vertigo.  Whenever she sits up in bed, the room will start to spin around her like a merry-go-round.  After a few seconds everything will calm down.  The attacks of vertigo only occur when she sits up or rolls over in bed.  She denies any hearing loss.  She denies any headache.  She denies any tinnitus.  She denies any other neurologic deficit.  She denies any vision changes Past Medical History:  Diagnosis Date  . Adie's pupil    LEFT EYE  . Allergy   . Asthma   . Atherosclerosis of aorta (HCC)    bulge above renal arteries on mri 2016  . Cancer [facility]) 2017   right breast ca  . Cellulitis    chest  . Complication of anesthesia    VERY HARD TO WAKE UP    . Hypertension   . Personal history of radiation therapy   . PONV (postoperative nausea and vomiting)   . Smoker    Past Surgical History:  Procedure Laterality Date  . ABDOMINAL HYSTERECTOMY    . BREAST BIOPSY Right 01/02/2016  . BREAST EXCISIONAL BIOPSY Right 1979  . BREAST IMPLANT REMOVAL Bilateral 10/05/2017   Procedure: IOXBDZHGD BILATERAL BREAST IMPLANTS;  Surgeon: Wallace Going, DO;  Location: [place];  Service: Plastics;  Laterality: Bilateral;  . BREAST LUMPECTOMY Right 01/27/2016  . BREAST LUMPECTOMY WITH RADIOACTIVE SEED AND SENTINEL LYMPH NODE BIOPSY Right 01/27/2016   Procedure: BREAST LUMPECTOMY WITH RADIOACTIVE SEED AND SENTINEL LYMPH NODE BIOPSY;  Surgeon: Excell Seltzer, MD;  Location: [place];  Service: General;  Laterality: Right;  . BREAST RECONSTRUCTION WITH PLACEMENT OF TISSUE EXPANDER AND FLEX HD (ACELLULAR HYDRATED DERMIS) Bilateral 03/16/2017   Procedure: BILATERAL IMMEDIATE BREAST RECONSTRUCTION WITH PLACEMENT OF TISSUE EXPANDER AND FLEX HD (ACELLULAR HYDRATED DERMIS);  Surgeon: Wallace Going, DO;   Location: [place];  Service: Plastics;  Laterality: Bilateral;  . BREAST SURGERY  1979   cyst removed rt breast  . CATARACT EXTRACTION W/ INTRAOCULAR LENS IMPLANT     RT EYE  . CESAREAN SECTION     x2  . EXCISION OF BREAST LESION Left 01/30/2018   Procedure: EXCISION OF LEFT BREAST SCAR;  Surgeon: Wallace Going, DO;  Location: [place];  Service: Plastics;  Laterality: Left;  . LATISSIMUS FLAP TO BREAST Right 01/30/2018   Procedure: RIGHT LATISSIMUS MYOCUTANEOUS FLAP FOR RIGHT CHEST WALL RECONSTRUCTION;  Surgeon: Wallace Going, DO;  Location: [place];  Service: Plastics;  Laterality: Right;  . MASTECTOMY W/ SENTINEL NODE BIOPSY Bilateral 03/16/2017   Procedure: BILATERAL TOTAL MASTECTOMY WITH LEFT SENTINEL LYMPH NODE BIOPSY;  Surgeon: Excell Seltzer, MD;  Location: [place];  Service: General;  Laterality: Bilateral;  . REMOVAL OF BILATERAL TISSUE EXPANDERS WITH PLACEMENT OF BILATERAL BREAST IMPLANTS Bilateral 06/15/2017   Procedure: REMOVAL OF BILATERAL TISSUE EXPANDERS WITH PLACEMENT OF BILATERAL BREAST IMPLANTS;  Surgeon: Wallace Going, DO;  Location: [place];  Service: Plastics;  Laterality: Bilateral;  . TOTAL MASTECTOMY Bilateral 03/16/2017   SENTINAL NODE BIOPSY   Current Outpatient Medications on File Prior to Visit  Medication Sig Dispense Refill  . ANORO ELLIPTA 62.5-25 MCG/INH AEPB TAKE 1 PUFF BY MOUTH EVERY DAY 60 each 10  . Ascorbic Acid (  VITAMIN C) 1000 MG tablet Take 1,000 mg by mouth 2 (two) times daily.     Marland Kitchen losartan (COZAAR) 100 MG tablet TAKE 1 TABLET BY MOUTH EVERY DAY 90 tablet 3  . montelukast (SINGULAIR) 10 MG tablet TAKE 1 TABLET BY MOUTH EVERY DAY 90 tablet 3  . Multiple Vitamin (MULTIVITAMIN WITH MINERALS) TABS tablet Take 1 tablet by mouth daily.    . tamoxifen (NOLVADEX) 20 MG tablet Take 1 tablet (20 mg total) by mouth at bedtime. 90 tablet 4  . triamterene-hydrochlorothiazide (MAXZIDE-25) 37.5-25 MG tablet Take 1 tablet by mouth at  bedtime. Stop amlodipine 30 tablet 11  . VENTOLIN HFA 108 (90 Base) MCG/ACT inhaler USE 1-2 PUFFS EVERY 6 HOURS AS NEEDED FOR WHEEZING 18 Inhaler 5   No current facility-administered medications on file prior to visit.    Allergies  Allergen Reactions  . Penicillins Swelling, Rash and Other (See Comments)    Older sister died from reaction to penicillin Has patient had a PCN reaction causing immediate rash, facial/tongue/throat swelling, SOB or lightheadedness with hypotension: Yes Has patient had a PCN reaction causing severe rash involving mucus membranes or skin necrosis: No Has patient had a PCN reaction that required hospitalization: No Has patient had a PCN reaction occurring within the last 10 years: No If all of the above answers are "NO", then may proceed with Cephalosporin use.   . Sulfa Antibiotics Rash  . Levaquin [Levofloxacin] Nausea And Vomiting   Social History   Socioeconomic History  . Marital status: Married    Spouse name: Not on file  . Number of children: Not on file  . Years of education: Not on file  . Highest education level: Not on file  Occupational History  . Not on file  Social Needs  . Financial resource strain: Not on file  . Food insecurity:    Worry: Not on file    Inability: Not on file  . Transportation needs:    Medical: Not on file    Non-medical: Not on file  Tobacco Use  . Smoking status: Former Smoker    Packs/day: 0.50    Years: 40.00    Pack years: 20.00    Types: Cigarettes  . Smokeless tobacco: Never Used  Substance and Sexual Activity  . Alcohol use: Yes    Alcohol/week: 0.0 standard drinks    Comment: social  . Drug use: No  . Sexual activity: Yes    Birth control/protection: Surgical    Comment: hysterectomy  Lifestyle  . Physical activity:    Days per week: Not on file    Minutes per session: Not on file  . Stress: Not on file  Relationships  . Social connections:    Talks on phone: Not on file    Gets  together: Not on file    Attends religious service: Not on file    Active member of club or organization: Not on file    Attends meetings of clubs or organizations: Not on file    Relationship status: Not on file  . Intimate partner violence:    Fear of current or ex partner: Not on file    Emotionally abused: Not on file    Physically abused: Not on file    Forced sexual activity: Not on file  Other Topics Concern  . Not on file  Social History Narrative  . Not on file      Review of Systems  Neurological: Positive for dizziness.  All other  systems reviewed and are negative.      Objective:   Physical Exam  Constitutional: She appears well-developed and well-nourished. No distress.  HENT:  Right Ear: External ear normal.  Left Ear: External ear normal.  Nose: Nose normal.  Mouth/Throat: Oropharynx is clear and moist. No oropharyngeal exudate.  Eyes: Conjunctivae are normal.  Neck: Neck supple. No JVD present. No thyromegaly present.  Cardiovascular: Normal rate, regular rhythm and normal heart sounds.  Pulmonary/Chest: Effort normal and breath sounds normal. No respiratory distress. She has no wheezes. She has no rales.  Lymphadenopathy:    She has no cervical adenopathy.  Skin: She is not diaphoretic.  Vitals reviewed.         Assessment & Plan:  BPPV Patient appears to have BPPV.  Recommended meclizine 25 mg every 8 hours as needed for vertigo.  Recommended tincture of time and symptoms should gradually improve over the next 7 to 10 days.  If no better at 7 to 10 days, consider Epley maneuvers

## 2019-02-15 ENCOUNTER — Other Ambulatory Visit: Payer: Self-pay | Admitting: Family Medicine

## 2019-02-15 MED ORDER — MONTELUKAST SODIUM 10 MG PO TABS: 10.0000 mg | ORAL_TABLET | Freq: Every day | ORAL | 3 refills | Status: DC

## 2019-02-26 ENCOUNTER — Encounter (INDEPENDENT_AMBULATORY_CARE_PROVIDER_SITE_OTHER): Payer: Self-pay

## 2019-02-26 ENCOUNTER — Telehealth: Payer: 59 | Admitting: Physician Assistant

## 2019-02-26 ENCOUNTER — Encounter: Payer: Self-pay | Admitting: Physician Assistant

## 2019-02-26 DIAGNOSIS — R05 Cough: Secondary | ICD-10-CM

## 2019-02-26 DIAGNOSIS — R509 Fever, unspecified: Secondary | ICD-10-CM | POA: Diagnosis not present

## 2019-02-26 DIAGNOSIS — J029 Acute pharyngitis, unspecified: Secondary | ICD-10-CM

## 2019-02-26 DIAGNOSIS — R6889 Other general symptoms and signs: Secondary | ICD-10-CM | POA: Diagnosis not present

## 2019-02-26 DIAGNOSIS — Z20822 Contact with and (suspected) exposure to covid-19: Secondary | ICD-10-CM

## 2019-02-26 DIAGNOSIS — R059 Cough, unspecified: Secondary | ICD-10-CM

## 2019-02-26 DIAGNOSIS — R0981 Nasal congestion: Secondary | ICD-10-CM

## 2019-02-26 MED ORDER — BENZONATATE 100 MG PO CAPS: 100.0000 mg | ORAL_CAPSULE | Freq: Two times a day (BID) | ORAL | 0 refills | Status: DC | PRN

## 2019-02-26 MED ORDER — FLUTICASONE PROPIONATE 50 MCG/ACT NA SUSP: 2.0000 | Freq: Every day | NASAL | 6 refills | Status: DC

## 2019-02-26 MED ORDER — BENZONATATE 100 MG PO CAPS: 100.0000 mg | ORAL_CAPSULE | Freq: Three times a day (TID) | ORAL | 0 refills | Status: DC | PRN

## 2019-02-26 MED ORDER — FLUTICASONE PROPIONATE 50 MCG/ACT NA SUSP: 1.0000 | Freq: Every day | NASAL | 0 refills | Status: DC

## 2019-02-26 NOTE — Addendum Note (Signed)
Addended by: Waldon Merl on: 02/26/2019 09:58 AM   Modules accepted: Orders

## 2019-02-26 NOTE — Progress Notes (Signed)
We are sorry you are not feeling well.  Here is how we plan to help!  Based on what you have shared with me, it looks like you may have a viral upper respiratory infection.  Upper respiratory infections are caused by a large number of viruses; however, rhinovirus is the most common cause.   Symptoms vary from person to person, with common symptoms including sore throat, cough, and fatigue or lack of energy.  A low-grade fever of up to 100.4 may present, but is often uncommon.  Symptoms vary however, and are closely related to a person's age or underlying illnesses.  The most common symptoms associated with an upper respiratory infection are nasal discharge or congestion, cough, sneezing, headache and pressure in the ears and face.  These symptoms usually persist for about 3 to 10 days, but can last up to 2 weeks.  It is important to know that upper respiratory infections do not cause serious illness or complications in most cases.    Upper respiratory infections can be transmitted from person to person, with the most common method of transmission being a person's hands.  The virus is able to live on the skin and can infect other persons for up to 2 hours after direct contact.  Also, these can be transmitted when someone coughs or sneezes; thus, it is important to cover the mouth to reduce this risk.  To keep the spread of the illness at [place], good hand hygiene is very important.  This is an infection that is most likely caused by a virus. There are no specific treatments other than to help you with the symptoms until the infection runs its course.  We are sorry you are not feeling well.  Here is how we plan to help!   For nasal congestion, you may use an oral decongestants such as Mucinex D or if you have glaucoma or high blood pressure use plain Mucinex.  Saline nasal spray or nasal drops can help and can safely be used as often as needed for congestion.  For your congestion, I have prescribed Fluticasone  nasal spray one spray in each nostril twice a day  If you do not have a history of heart disease, hypertension, diabetes or thyroid disease, prostate/bladder issues or glaucoma, you may also use Sudafed to treat nasal congestion.  It is highly recommended that you consult with a pharmacist or your primary care physician to ensure this medication is safe for you to take.     If you have a cough, you may use cough suppressants such as Delsym and Robitussin.  If you have glaucoma or high blood pressure, you can also use Coricidin HBP.   For cough I have prescribed for you A prescription cough medication called Tessalon Perles 100 mg. You may take 1-2 capsules every 8 hours as needed for cough   If you have a sore or scratchy throat, use a saltwater gargle-  to  teaspoon of salt dissolved in a 4-ounce to 8-ounce glass of warm water.  Gargle the solution for approximately 15-30 seconds and then spit.  It is important not to swallow the solution.  You can also use throat lozenges/cough drops and Chloraseptic spray to help with throat pain or discomfort.  Warm or cold liquids can also be helpful in relieving throat pain.  If your symptoms persist, or if you develop any new symptoms, please follow up with your doctor. If you have had epsoure to someone with corona virus please reply  back to the this message.   For headache, pain or general discomfort, you can use Ibuprofen or Tylenol as directed.   Some authorities believe that zinc sprays or the use of Echinacea may shorten the course of your symptoms.   HOME CARE . Only take medications as instructed by your medical team. . Be sure to drink plenty of fluids. Water is fine as well as fruit juices, sodas and electrolyte beverages. You may want to stay away from caffeine or alcohol. If you are nauseated, try taking small sips of liquids. How do you know if you are getting enough fluid? Your urine should be a pale yellow or almost colorless. . Get  rest. . Taking a steamy shower or using a humidifier may help nasal congestion and ease sore throat pain. You can place a towel over your head and breathe in the steam from hot water coming from a faucet. . Using a saline nasal spray works much the same way. . Cough drops, hard candies and sore throat lozenges may ease your cough. . Avoid close contacts especially the very young and the elderly . Cover your mouth if you cough or sneeze . Always remember to wash your hands.   GET HELP RIGHT AWAY IF: . You develop worsening fever. . If your symptoms do not improve within 10 days . You develop yellow or green discharge from your nose over 3 days. . You have coughing fits . You develop a severe head ache or visual changes. . You develop shortness of breath, difficulty breathing or start having chest pain . Your symptoms persist after you have completed your treatment plan  MAKE SURE YOU   Understand these instructions.  Will watch your condition.  Will get help right away if you are not doing well or get worse.  Your e-visit answers were reviewed by a board certified advanced clinical practitioner to complete your personal care plan. Depending upon the condition, your plan could have included both over the counter or prescription medications. Please review your pharmacy choice. If there is a problem, you may call our nursing hot line at and have the prescription routed to another pharmacy. Your safety is important to [place]. If you have drug allergies check your prescription carefully.   You can use MyChart to ask questions about today's visit, request a non-urgent call back, or ask for a work or school excuse for 24 hours related to this e-Visit. If it has been greater than 24 hours you will need to follow up with your provider, or enter a new e-Visit to address those concerns. You will get an e-mail in the next two days asking about your experience.  I hope that your e-visit has been valuable  and will speed your recovery. Thank you for using e-visits.     I spent 5-10 minutes on review and completion of this note- Lacy Duverney [facility]

## 2019-02-26 NOTE — Progress Notes (Signed)
E-Visit for Corona Virus Screening   Your current symptoms could be consistent with the coronavirus.  Call your health care provider or local health department to request and arrange formal testing. Many health care providers can now test patients at their office but not all are.  Please quarantine yourself while awaiting your test results.  [place] [phone], [place], [place] [phone] or visit [email or web address]  and You have been enrolled in [place] for COVID-19.  Daily you will receive a questionnaire within the [facility] website. Our COVID-19 response team will be monitoring your responses daily.    COVID-19 is a respiratory illness with symptoms that are similar to the flu. Symptoms are typically mild to moderate, but there have been cases of severe illness and death due to the virus. The following symptoms may appear 2-14 days after exposure: . Fever . Cough . Shortness of breath or difficulty breathing . Chills . Repeated shaking with chills . Muscle pain . Headache . Sore throat . New loss of taste or smell . Fatigue . Congestion or runny nose . Nausea or vomiting . Diarrhea  It is vitally important that if you feel that you have an infection such as this virus or any other virus that you stay home and away from places where you may spread it to others.  You should self-quarantine for 14 days if you have symptoms that could potentially be coronavirus or have been in close contact a with a person diagnosed with COVID-19 within the last 2 weeks. You should avoid contact with people age 62 and older.   You should wear a mask or cloth face covering over your nose and mouth if you must be around other people or animals, including pets (even at home). Try to stay at least 6 feet away from other people. This will protect the  people around you.  You can use medication such as A prescription cough medication called Tessalon Perles 100 mg. You may take 1-2 capsules every 8 hours as needed for cough  You may also take acetaminophen (Tylenol) as needed for fever.  I have also prescribed Flonase for nasal congestion, use one spray in each nostril one daily.  You have been provided a work note.    Reduce your risk of any infection by using the same precautions used for avoiding the common cold or flu:  Marland Kitchen Wash your hands often with soap and warm water for at least 20 seconds.  If soap and water are not readily available, use an alcohol-based hand sanitizer with at least 60% alcohol.  . If coughing or sneezing, cover your mouth and nose by coughing or sneezing into the elbow areas of your shirt or coat, into a tissue or into your sleeve (not your hands). . Avoid shaking hands with others and consider head nods or verbal greetings only. . Avoid touching your eyes, nose, or mouth with unwashed hands.  . Avoid close contact with people who are sick. . Avoid places or events with large numbers of people in one location, like concerts or sporting events. . Carefully consider travel plans you have or are making. . If you are planning any travel outside or inside the [place], visit the CDC's Travelers' Health webpage for the latest health notices. . If you have some symptoms but not all symptoms, continue to monitor at home and seek medical attention if your symptoms worsen. . If you are having a medical  emergency, call 911.  HOME CARE . Only take medications as instructed by your medical team. . Drink plenty of fluids and get plenty of rest. . A steam or ultrasonic humidifier can help if you have congestion.   GET HELP RIGHT AWAY IF YOU HAVE EMERGENCY WARNING SIGNS** FOR COVID-19. If you or someone is showing any of these signs seek emergency medical care immediately. Call 911 or proceed to your closest emergency facility  if: . You develop worsening high fever. . Trouble breathing . Bluish lips or face . Persistent pain or pressure in the chest . New confusion . Inability to wake or stay awake . You cough up blood. . Your symptoms become more severe  **This list is not all possible symptoms. Contact your medical provider for any symptoms that are sever or concerning to you.   MAKE SURE YOU   Understand these instructions.  Will watch your condition.  Will get help right away if you are not doing well or get worse.  Your e-visit answers were reviewed by a board certified advanced clinical practitioner to complete your personal care plan.  Depending on the condition, your plan could have included both over the counter or prescription medications.  If there is a problem please reply once you have received a response from your provider.  Your safety is important to [place].  If you have drug allergies check your prescription carefully.    You can use MyChart to ask questions about today's visit, request a non-urgent call back, or ask for a work or school excuse for 24 hours related to this e-Visit. If it has been greater than 24 hours you will need to follow up with your provider, or enter a new e-Visit to address those concerns. You will get an e-mail in the next two days asking about your experience.  I hope that your e-visit has been valuable and will speed your recovery. Thank you for using e-visits.   I spent 5-10 minutes on review and completion of this note- Lacy Duverney [facility]

## 2019-02-26 NOTE — Progress Notes (Signed)
Please disregard this note. Patient submitted another Covid screen note. Medications and instructions of the Covid Screen Evisit.      NOTE: If you entered your credit card information for this eVisit, you will not be charged. You may see a "hold" on your card for the $35 but that hold will drop off and you will not have a charge processed.  If you are having a true medical emergency please call 911.     For an urgent face to face visit, [facility] has five urgent care centers for your convenience:    [email or web address] to reserve your spot online an avoid wait times  [facility] [address], [place] [address] Modified hours of operation: Monday-Friday, 12 PM to 6 PM  Closed Saturday & Sunday  *Across the street from [place] (New Address[address], [place], [place] [address] *Just off [address], across the road from [place] hours of operation: Monday-Friday, 12 PM to 6 PM  Closed Saturday & Sunday   The following sites will take your insurance:  . [facility]    [phone]                  Get Driving Directions  [address], [place] [address] . 10 am to 8 pm Monday-Friday . 12 pm to 8 pm Saturday-Sunday   . [facility] at [place]                  Get Driving Directions  [address], [place], [place] [address] . 8 am to 8 pm Monday-Friday . 9 am to 6 pm Saturday . 11 am to 6 pm Sunday   . [facility] at [place]                  Get Driving Directions   [address] [place], [place] [address] . 8 am to 8 pm Monday-Friday . 8 am to 4 pm Saturday-Sunday    . [facility] at [place]                    Get Driving Directions  [phone]  [address] [place], [place] [address]  .  Monday-Friday, 12 PM to 6 PM    Your e-visit answers were reviewed by a board certified advanced clinical practitioner to complete your personal care plan.  Thank you for using e-Visits.   I spent 5-10 minutes on review and completion of this note- Lacy Duverney [facility]

## 2019-02-27 ENCOUNTER — Encounter (INDEPENDENT_AMBULATORY_CARE_PROVIDER_SITE_OTHER): Payer: Self-pay

## 2019-02-27 ENCOUNTER — Telehealth: Payer: Self-pay | Admitting: Family Medicine

## 2019-02-27 ENCOUNTER — Telehealth: Payer: Self-pay | Admitting: *Deleted

## 2019-02-27 DIAGNOSIS — Z20822 Contact with and (suspected) exposure to covid-19: Secondary | ICD-10-CM

## 2019-02-27 NOTE — Telephone Encounter (Signed)
Pt called and apt set up for 9 AM tomorrow morning

## 2019-02-27 NOTE — Telephone Encounter (Signed)
Pt did a MyChart E-visit and they advised her to do a COVID test, they told her to contact us to order the test and to inform her of where to go.   Please advise.

## 2019-02-27 NOTE — Telephone Encounter (Signed)
Can we send her to [facility] or does she need a phone visit first??

## 2019-02-27 NOTE — Telephone Encounter (Signed)
Scheduled patient for COVID 19 test tomorrow at 9 am at [facility].  Testing protocol reviewed with patient.

## 2019-02-27 NOTE — Telephone Encounter (Signed)
-----   Message from Launa Grill, LPN sent at 05/01/2223 10:25 AM EDT ----- Regarding: Patient needs testing Patient needs COVID Testing. Is symptomatic

## 2019-02-27 NOTE — Telephone Encounter (Signed)
Pt called and is going to get covid testing in the am but her symptoms have changed since her e-visit  - she is having chest congestion and coughing up yellow. Pt was wanting a phone visit as she states that she needs a Zpac to knock this stuff out of her chest. Phone visit made.

## 2019-02-27 NOTE — Telephone Encounter (Signed)
Okay to send PEC test, does not need phone visit since she had e chart visit

## 2019-02-28 ENCOUNTER — Telehealth: Payer: Self-pay | Admitting: *Deleted

## 2019-02-28 ENCOUNTER — Other Ambulatory Visit: Payer: 59

## 2019-02-28 ENCOUNTER — Ambulatory Visit (INDEPENDENT_AMBULATORY_CARE_PROVIDER_SITE_OTHER): Payer: 59 | Admitting: Family Medicine

## 2019-02-28 ENCOUNTER — Other Ambulatory Visit: Payer: Self-pay

## 2019-02-28 ENCOUNTER — Encounter: Payer: Self-pay | Admitting: Family Medicine

## 2019-02-28 ENCOUNTER — Encounter (INDEPENDENT_AMBULATORY_CARE_PROVIDER_SITE_OTHER): Payer: Self-pay

## 2019-02-28 DIAGNOSIS — J0100 Acute maxillary sinusitis, unspecified: Secondary | ICD-10-CM

## 2019-02-28 DIAGNOSIS — J209 Acute bronchitis, unspecified: Secondary | ICD-10-CM

## 2019-02-28 DIAGNOSIS — Z20822 Contact with and (suspected) exposure to covid-19: Secondary | ICD-10-CM

## 2019-02-28 MED ORDER — PREDNISONE 10 MG PO TABS: ORAL_TABLET | ORAL | 0 refills | Status: DC

## 2019-02-28 MED ORDER — AZITHROMYCIN 250 MG PO TABS: ORAL_TABLET | ORAL | 0 refills | Status: DC

## 2019-02-28 NOTE — Telephone Encounter (Signed)
Contacted pt regarding symptoms; she thinks that she has an URI, and her COVID test was negative; the pt says that she has a productive cough with green secretions; she has a phone appointment at 1115 with her PCP.

## 2019-02-28 NOTE — Progress Notes (Signed)
Virtual Visit via Telephone Note  I connected with Krystal Brooks on 02/28/19 at 11:55am  by telephone and verified that I am speaking with the correct person using two identifiers.       Pt location: at home   Physician location:  In office, [facility], Vic Blackbird MD     On call: patient and physician   I discussed the limitations, risks, security and privacy concerns of performing an evaluation and management service by telephone and the availability of in person appointments. I also discussed with the patient that there may be a patient responsible charge related to this service. The patient expressed understanding and agreed to proceed.   History of Present Illness:  Friday started with sinus pressure and drainage, had pain under eyes and HA. This persisted until Monday, then started getting congestion in her chest. This happens a few times a year. She did get COVID-19 testing done this AM, after mychart visit She has some wheezing, cough with production that worsened since Monday  Fever 100.8 Tmax Sunday night, tylenol every 6 hours, now down to 62F with the tylenol  She does have history of Asthma She has been within house , no known COVID exposoure  Other meds- has been taking Anora, she is using ventolin recently due to the wheezing , she has been taking mucinex that made her feel worse    Observations/Objective: NAD , normal speech and WOB on phone, pt not examined on phone  Assessment and Plan: Acute sinusitis with Asthma bronchitis based on history, though COVID is prevalent so swab was still indicated  Will give Azithromycin, prednisone, coninue albuterol, anora  Follow Up Instructions:    I discussed the assessment and treatment plan with the patient. The patient was provided an opportunity to ask questions and all were answered. The patient agreed with the plan and demonstrated an understanding of the instructions.   The patient was advised to call  back or seek an in-person evaluation if the symptoms worsen or if the condition fails to improve as anticipated.  I provided 8 minutes of non-face-to-face time during this encounter. End Time 12:03pm  Vic Blackbird, MD

## 2019-02-28 NOTE — Telephone Encounter (Addendum)
Attempted to contact pt due to my chart companion responses of cough and weakness that are worse, and temp 101.1; left message on voicemail.

## 2019-03-01 ENCOUNTER — Encounter (INDEPENDENT_AMBULATORY_CARE_PROVIDER_SITE_OTHER): Payer: Self-pay

## 2019-03-02 ENCOUNTER — Encounter (INDEPENDENT_AMBULATORY_CARE_PROVIDER_SITE_OTHER): Payer: Self-pay

## 2019-03-04 ENCOUNTER — Encounter (INDEPENDENT_AMBULATORY_CARE_PROVIDER_SITE_OTHER): Payer: Self-pay

## 2019-03-05 ENCOUNTER — Encounter (INDEPENDENT_AMBULATORY_CARE_PROVIDER_SITE_OTHER): Payer: Self-pay

## 2019-03-05 ENCOUNTER — Telehealth: Payer: Self-pay | Admitting: Plastic Surgery

## 2019-03-05 NOTE — Telephone Encounter (Signed)

## 2019-03-06 ENCOUNTER — Other Ambulatory Visit: Payer: Self-pay

## 2019-03-06 ENCOUNTER — Encounter: Payer: Self-pay | Admitting: Plastic Surgery

## 2019-03-06 ENCOUNTER — Encounter (INDEPENDENT_AMBULATORY_CARE_PROVIDER_SITE_OTHER): Payer: Self-pay

## 2019-03-06 ENCOUNTER — Ambulatory Visit: Payer: 59 | Admitting: Plastic Surgery

## 2019-03-06 VITALS — BP 131/86 | HR 71 | Temp 98.2°F | Ht 61.0 in | Wt 126.0 lb

## 2019-03-06 DIAGNOSIS — Z9889 Other specified postprocedural states: Secondary | ICD-10-CM | POA: Diagnosis not present

## 2019-03-06 DIAGNOSIS — Z9013 Acquired absence of bilateral breasts and nipples: Secondary | ICD-10-CM | POA: Diagnosis not present

## 2019-03-06 LAB — NOVEL CORONAVIRUS, NAA: SARS-CoV-2, NAA: NOT DETECTED

## 2019-03-06 NOTE — Progress Notes (Signed)
Patient ID: Krystal Brooks, female    DOB: 05-04-1956, 63 y.o.   MRN: [id number]   Chief Complaint  Patient presents with  . Breast Problem    The patient is a 63 year old white female here for a one-year follow-up on her breast surgery.  She underwent bilateral mastectomies and then required radiation.  She had a latissimus muscle flap on the right side.  In the end she decided to have all the implants removed and not to have any further reconstruction.  She is pleased with her decision overall she is not had any issues with lumps or bumps.  She looks good and seems to be doing very well.  On exam I do not feel anything of concern.  She does have occasional tightness on the right which is likely related to the latissimus muscle.  She did physical therapy in the past and said it did help and gave her considerable improvement.  She is 5 feet 1 inches tall and weighs 126 pounds.  She sees oncology every October.  She was fitted for breast prosthetics but states she is more comfortable without them.   Review of Systems  Constitutional: Negative for activity change and appetite change.  HENT: Negative for dental problem.   Eyes: Negative.   Respiratory: Positive for chest tightness. Negative for shortness of breath.   Gastrointestinal: Negative for abdominal pain.  Genitourinary: Negative.   Musculoskeletal: Negative for back pain and neck pain.  Skin: Negative for color change and wound.    Past Medical History:  Diagnosis Date  . Adie's pupil    LEFT EYE  . Allergy   . Asthma   . Atherosclerosis of aorta (HCC)    bulge above renal arteries on mri 2016  . Cancer [facility]) 2017   right breast ca  . Cellulitis    chest  . Complication of anesthesia    VERY HARD TO WAKE UP    . Hypertension   . Personal history of radiation therapy   . PONV (postoperative nausea and vomiting)   . Smoker     Past Surgical History:  Procedure Laterality Date  . ABDOMINAL HYSTERECTOMY    . BREAST  BIOPSY Right 01/02/2016  . BREAST EXCISIONAL BIOPSY Right 1979  . BREAST IMPLANT REMOVAL Bilateral 10/05/2017   Procedure: [id number] BILATERAL BREAST IMPLANTS;  Surgeon: Wallace Going, DO;  Location: [facility];  Service: Plastics;  Laterality: Bilateral;  . BREAST LUMPECTOMY Right 01/27/2016  . BREAST LUMPECTOMY WITH RADIOACTIVE SEED AND SENTINEL LYMPH NODE BIOPSY Right 01/27/2016   Procedure: BREAST LUMPECTOMY WITH RADIOACTIVE SEED AND SENTINEL LYMPH NODE BIOPSY;  Surgeon: Excell Seltzer, MD;  Location: [facility];  Service: General;  Laterality: Right;  . BREAST RECONSTRUCTION WITH PLACEMENT OF TISSUE EXPANDER AND FLEX HD (ACELLULAR HYDRATED DERMIS) Bilateral 03/16/2017   Procedure: BILATERAL IMMEDIATE BREAST RECONSTRUCTION WITH PLACEMENT OF TISSUE EXPANDER AND FLEX HD (ACELLULAR HYDRATED DERMIS);  Surgeon: Wallace Going, DO;  Location: [facility];  Service: Plastics;  Laterality: Bilateral;  . BREAST SURGERY  1979   cyst removed rt breast  . CATARACT EXTRACTION W/ INTRAOCULAR LENS IMPLANT     RT EYE  . CESAREAN SECTION     x2  . EXCISION OF BREAST LESION Left 01/30/2018   Procedure: EXCISION OF LEFT BREAST SCAR;  Surgeon: Wallace Going, DO;  Location: [place];  Service: Plastics;  Laterality: Left;  . LATISSIMUS FLAP TO BREAST Right 01/30/2018   Procedure:  RIGHT LATISSIMUS MYOCUTANEOUS FLAP FOR RIGHT CHEST WALL RECONSTRUCTION;  Surgeon: Wallace Going, DO;  Location: [place] [facility];  Service: Plastics;  Laterality: Right;  . MASTECTOMY W/ SENTINEL NODE BIOPSY Bilateral 03/16/2017   Procedure: BILATERAL TOTAL MASTECTOMY WITH LEFT SENTINEL LYMPH NODE BIOPSY;  Surgeon: Excell Seltzer, MD;  Location: [place];  Service: General;  Laterality: Bilateral;  . REMOVAL OF BILATERAL TISSUE EXPANDERS WITH PLACEMENT OF BILATERAL BREAST IMPLANTS Bilateral 06/15/2017   Procedure: REMOVAL OF BILATERAL TISSUE EXPANDERS WITH PLACEMENT OF BILATERAL BREAST IMPLANTS;   Surgeon: Wallace Going, DO;  Location: [place];  Service: Plastics;  Laterality: Bilateral;  . TOTAL MASTECTOMY Bilateral 03/16/2017   SENTINAL NODE BIOPSY      Current Outpatient Medications:  .  ANORO ELLIPTA 62.5-25 MCG/INH AEPB, TAKE 1 PUFF BY MOUTH EVERY DAY, Disp: 60 each, Rfl: 10 .  Ascorbic Acid (VITAMIN C) 1000 MG tablet, Take 1,000 mg by mouth 2 (two) times daily. , Disp: , Rfl:  .  azithromycin (ZITHROMAX) 250 MG tablet, Take 2 tablets x 1 day,then 1 tablet daily for 5 days, Disp: 6 tablet, Rfl: 0 .  benzonatate (TESSALON) 100 MG capsule, Take 1 capsule (100 mg total) by mouth 2 (two) times daily as needed for cough. (Patient not taking: Reported on 03/06/2019), Disp: 20 capsule, Rfl: 0 .  fluticasone (FLONASE) 50 MCG/ACT nasal spray, Place 2 sprays into both nostrils daily. (Patient not taking: Reported on 03/06/2019), Disp: 16 g, Rfl: 6 .  losartan (COZAAR) 100 MG tablet, TAKE 1 TABLET BY MOUTH EVERY DAY, Disp: 90 tablet, Rfl: 3 .  meclizine (ANTIVERT) 25 MG tablet, Take 1 tablet (25 mg total) by mouth 3 (three) times daily as needed for dizziness., Disp: 30 tablet, Rfl: 0 .  montelukast (SINGULAIR) 10 MG tablet, Take 1 tablet (10 mg total) by mouth daily., Disp: 90 tablet, Rfl: 3 .  Multiple Vitamin (MULTIVITAMIN WITH MINERALS) TABS tablet, Take 1 tablet by mouth daily., Disp: , Rfl:  .  predniSONE (DELTASONE) 10 MG tablet, Take 40mg  x 2 days,20mg  x 2 days, 10mg  x2 days (Patient not taking: Reported on 03/06/2019), Disp: 14 tablet, Rfl: 0 .  tamoxifen (NOLVADEX) 20 MG tablet, Take 1 tablet (20 mg total) by mouth at bedtime., Disp: 90 tablet, Rfl: 4 .  triamterene-hydrochlorothiazide (MAXZIDE-25) 37.5-25 MG tablet, Take 1 tablet by mouth at bedtime. Stop amlodipine, Disp: 30 tablet, Rfl: 11 .  VENTOLIN HFA 108 (90 Base) MCG/ACT inhaler, USE 1-2 PUFFS EVERY 6 HOURS AS NEEDED FOR WHEEZING, Disp: 18 Inhaler, Rfl: 5   Objective:   Vitals:   03/06/19 1314  BP:  131/86  Pulse: 71  Temp: 98.2 F (36.8 C)  SpO2: 97%    Physical Exam Vitals signs and nursing note reviewed.  Constitutional:      Appearance: Normal appearance.  HENT:     Head: Normocephalic and atraumatic.  Cardiovascular:     Rate and Rhythm: Normal rate.  Pulmonary:     Effort: Pulmonary effort is normal. No respiratory distress.     Breath sounds: No wheezing.  Abdominal:     General: Abdomen is flat. There is no distension.     Tenderness: There is no abdominal tenderness.  Musculoskeletal:        General: No swelling or deformity.  Skin:    General: Skin is warm.  Neurological:     General: No focal deficit present.     Mental Status: She is alert. Mental status is at baseline.  Psychiatric:        Mood and Affect: Mood normal.        Behavior: Behavior normal.        Thought Content: Thought content normal.     Assessment & Plan:     ICD-10-CM   1. S/P mastectomy, bilateral  Z90.13 Ambulatory referral to Physical Therapy  2. Status post bilateral breast reconstruction  Z98.890     Due to the tightness in the right shoulder we will order physical therapy and see if this will help.  Continue self breast exams.  Follow-up in 1 year.  Call with any questions.  Always a pleasure to see this patient.  Pictures were obtained of the patient and placed in the chart with the patient's or guardian's permission.   [place], DO

## 2019-03-09 ENCOUNTER — Other Ambulatory Visit: Payer: Self-pay | Admitting: *Deleted

## 2019-03-09 ENCOUNTER — Encounter (INDEPENDENT_AMBULATORY_CARE_PROVIDER_SITE_OTHER): Payer: Self-pay

## 2019-03-09 MED ORDER — MONTELUKAST SODIUM 10 MG PO TABS: 10.0000 mg | ORAL_TABLET | Freq: Every day | ORAL | 3 refills | Status: DC

## 2019-03-14 ENCOUNTER — Other Ambulatory Visit: Payer: Self-pay | Admitting: Family Medicine

## 2019-05-02 ENCOUNTER — Other Ambulatory Visit: Payer: Self-pay | Admitting: Family Medicine

## 2019-05-02 DIAGNOSIS — I10 Essential (primary) hypertension: Secondary | ICD-10-CM

## 2019-06-05 NOTE — Progress Notes (Signed)
Krystal Brooks  [phone] [phone]     ID: Krystal Brooks DOB: 1956-04-11  MR#: [id number]  ZGY#:[id number]  Patient Care Team: Susy Frizzle, MD as PCP - General (Family Medicine) Excell Seltzer, MD as Consulting Physician (General Surgery) Druscilla Brownie, MD as Consulting Physician (Dermatology) Dillingham, [name], DO as Attending Physician (Plastic Surgery) Jhace Fennell, Virgie Dad, MD as Consulting Physician (Oncology) Delice Bison, Charlestine Massed, NP as Nurse Practitioner (Hematology and Oncology) OTHER MD:  CHIEF COMPLAINT: Recurrent estrogen receptor positive breast cancer (s/p bilateral mastectomies)  CURRENT TREATMENT: Tamoxifen   INTERVAL HISTORY: Krystal Brooks returns today for follow-up of her estrogen receptor positive breast cancer.   She continues on tamoxifen, which she is tolerating well.  She has occasional hot flashes which are not a major deal.  Vaginal wetness is not a concern.  She wonders if the weight is related to tamoxifen but her weight is actually fine on tamoxifen of course does not the reason for her BMI.  Since her last visit, she has not undergone any additional studies.    REVIEW OF SYSTEMS: Krystal Brooks is very concerned about the virus, as she may well be since she has respiratory issues and of course her history of cancer.  In addition she is terrified of vaccines.  She says her sister died at 46 months on the Dr. stable after a shot of penicillin, she herself has a penicillin allergy, and the only vaccine she received was the initial tetanus which she reacted against and then smallpox.  She has never had a flu vaccine for example.  Currently she denies unusual headaches visual changes cough phlegm production pleurisy shortness of breath or any change in bowel or bladder habits.   BREAST CANCER HISTORY: From Dr Ernestina Penna 01/14/16 note:  "Krystal Brooks 63 y.o. female is here because of Her newly diagnosed right breast cancer. She  is accompanied by her husband and of friend to our multidisciplinary rest clinic today.  This was found by screening mammogram,  she denies any palpable mass, skin change or nipple discharge, or ulcer constitutional symptoms before the screening.  Her prior mammo 6 years ago. She had a right breast cyst removed in 1979. "  Malignant neoplasm of lower-inner quadrant of right breast of female, estrogen receptor positive ([place])   12/29/2015 Mammogram    Diagnostic right mammogram showed a 2.3 x 2.1 x 2.2 cm group of coarse calcification within the medial slightly lower right breast.      01/02/2016 Initial Diagnosis    Breast cancer of lower-inner quadrant of right female breast ([place])      01/02/2016 Initial Biopsy    right breast LIQ core needle biopsy showed LCIS, with a small focus of invasive lobular carcinoma, grade 2.       01/02/2016 Receptors her2    ER 10% positive, moderate staining. PR negative. Insufficient tissue for HER-2 testing.      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 0/2 SLN.       01/27/2016 Receptors her2    The surgical sample of the invasive carcinoma was not sufficient for Her2 test and other additional studies.      03/09/2016 - 04/19/2016 Radiation Therapy  Adjuvant breast radiation [name]). Right breast: 50 Gy in 25 fractions. Right breast "boost": 10 Gy in 5 fractions.        04/2016 -  Anti-estrogen oral therapy    Anastrozole 1 mg daily. Planned duration of therapy: 5 years       PAST MEDICAL HISTORY: Past Medical History:  Diagnosis Date  . Adie's pupil    LEFT EYE  . Allergy   . Asthma   . Atherosclerosis of aorta (HCC)     bulge above renal arteries on mri 2016  . Cancer [facility]) 2017   right breast ca  . Cellulitis    chest  . Complication of anesthesia    VERY HARD TO WAKE UP    . Hypertension   . Personal history of radiation therapy   . PONV (postoperative nausea and vomiting)   . Smoker     PAST SURGICAL HISTORY: Past Surgical History:  Procedure Laterality Date  . ABDOMINAL HYSTERECTOMY    . BREAST BIOPSY Right 01/02/2016  . BREAST EXCISIONAL BIOPSY Right 1979  . BREAST IMPLANT REMOVAL Bilateral 10/05/2017   Procedure: [id number] BILATERAL BREAST IMPLANTS;  Surgeon: Wallace Going, DO;  Location: [place];  Service: Plastics;  Laterality: Bilateral;  . BREAST LUMPECTOMY Right 01/27/2016  . BREAST LUMPECTOMY WITH RADIOACTIVE SEED AND SENTINEL LYMPH NODE BIOPSY Right 01/27/2016   Procedure: BREAST LUMPECTOMY WITH RADIOACTIVE SEED AND SENTINEL LYMPH NODE BIOPSY;  Surgeon: Excell Seltzer, MD;  Location: [place];  Service: General;  Laterality: Right;  . BREAST RECONSTRUCTION WITH PLACEMENT OF TISSUE EXPANDER AND FLEX HD (ACELLULAR HYDRATED DERMIS) Bilateral 03/16/2017   Procedure: BILATERAL IMMEDIATE BREAST RECONSTRUCTION WITH PLACEMENT OF TISSUE EXPANDER AND FLEX HD (ACELLULAR HYDRATED DERMIS);  Surgeon: Wallace Going, DO;  Location: [place];  Service: Plastics;  Laterality: Bilateral;  . BREAST SURGERY  1979   cyst removed rt breast  . CATARACT EXTRACTION W/ INTRAOCULAR LENS IMPLANT     RT EYE  . CESAREAN SECTION     x2  . EXCISION OF BREAST LESION Left 01/30/2018   Procedure: EXCISION OF LEFT BREAST SCAR;  Surgeon: Wallace Going, DO;  Location: [place];  Service: Plastics;  Laterality: Left;  . LATISSIMUS FLAP TO BREAST Right 01/30/2018   Procedure: RIGHT LATISSIMUS MYOCUTANEOUS FLAP FOR RIGHT CHEST WALL RECONSTRUCTION;  Surgeon: Wallace Going, DO;  Location: [place];  Service: Plastics;  Laterality: Right;  . MASTECTOMY W/ SENTINEL NODE BIOPSY  Bilateral 03/16/2017   Procedure: BILATERAL TOTAL MASTECTOMY WITH LEFT SENTINEL LYMPH NODE BIOPSY;  Surgeon: Excell Seltzer, MD;  Location: [place];  Service: General;  Laterality: Bilateral;  . REMOVAL OF BILATERAL TISSUE EXPANDERS WITH PLACEMENT OF BILATERAL BREAST IMPLANTS Bilateral 06/15/2017   Procedure: REMOVAL OF BILATERAL TISSUE EXPANDERS WITH PLACEMENT OF BILATERAL BREAST IMPLANTS;  Surgeon: Wallace Going, DO;  Location: [place];  Service: Plastics;  Laterality: Bilateral;  . TOTAL MASTECTOMY Bilateral 03/16/2017   SENTINAL NODE BIOPSY    FAMILY HISTORY Family History  Problem Relation Age of Onset  . Hearing loss Mother   . Asthma Father   . Cancer Father        melanoma, metastases   . Diabetes Father   . Heart disease Father   . Early death Sister   . Early death Paternal Grandfather   . Cancer Maternal Grandmother 88       colon cancer   . Breast cancer Neg Hx   . Ovarian  cancer Neg Hx   The patient's father died at age 30. He had been diagnosed with melanoma in 1 year. That was not the cause of death. The patient's mother is living at age 49 as of May 2018. The patient has one brother, no sisters. There is no history of breast or ovarian cancer in the family, and no other melanoma history.   GYNECOLOGIC HISTORY:  No LMP recorded. Patient has had a hysterectomy. 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 She frequently keeps her 2 grandchildren. Her husband Krystal Brooks is a retired Airline pilot. He now owns a business where they prepare the connection between the front living in/RV portion and a horse trailer: They do the hitch combination. Daughter Krystal Brooks lives in [place] where she works as a Statistician. Daughter Krystal Brooks lives in [place] where  she is an Forensic scientist for [employer]. The patient has 4 grandchildren. She is a Tourist information centre manager.    ADVANCED DIRECTIVES: The patient's husband is automatically her healthcare power of attorney   HEALTH MAINTENANCE: Social History   Tobacco Use  . Smoking status: Former Smoker    Packs/day: 0.50    Years: 40.00    Pack years: 20.00    Types: Cigarettes  . Smokeless tobacco: Never Used  Substance Use Topics  . Alcohol use: Yes    Alcohol/week: 0.0 standard drinks    Comment: social  . Drug use: No     Colonoscopy:  PAP:  Bone density:05/24/2016 showed a T score of -0.6 normal   Allergies  Allergen Reactions  . Penicillins Swelling, Rash and Other (See Comments)    Older sister died from reaction to penicillin Has patient had a PCN reaction causing immediate rash, facial/tongue/throat swelling, SOB or lightheadedness with hypotension: Yes Has patient had a PCN reaction causing severe rash involving mucus membranes or skin necrosis: No Has patient had a PCN reaction that required hospitalization: No Has patient had a PCN reaction occurring within the last 10 years: No If all of the above answers are "NO", then may proceed with Cephalosporin use.   . Sulfa Antibiotics Rash  . Levaquin [Levofloxacin] Nausea And Vomiting    Current Outpatient Medications  Medication Sig Dispense Refill  . ANORO ELLIPTA 62.5-25 MCG/INH AEPB TAKE 1 PUFF BY MOUTH EVERY DAY 60 each 10  . Ascorbic Acid (VITAMIN C) 1000 MG tablet Take 1,000 mg by mouth 2 (two) times daily.     Marland Kitchen azithromycin (ZITHROMAX) 250 MG tablet Take 2 tablets x 1 day,then 1 tablet daily for 5 days 6 tablet 0  . benzonatate (TESSALON) 100 MG capsule Take 1 capsule (100 mg total) by mouth 2 (two) times daily as needed for cough. (Patient not taking: Reported on 03/06/2019) 20 capsule 0  . fluticasone (FLONASE) 50 MCG/ACT nasal spray Place 2 sprays into both nostrils daily. (Patient not taking: Reported on 03/06/2019) 16 g 6   . losartan (COZAAR) 100 MG tablet TAKE 1 TABLET BY MOUTH EVERY DAY 90 tablet 3  . meclizine (ANTIVERT) 25 MG tablet Take 1 tablet (25 mg total) by mouth 3 (three) times daily as needed for dizziness. 30 tablet 0  . montelukast (SINGULAIR) 10 MG tablet Take 1 tablet (10 mg total) by mouth daily. 90 tablet 3  . Multiple Vitamin (  MULTIVITAMIN WITH MINERALS) TABS tablet Take 1 tablet by mouth daily.    . predniSONE (DELTASONE) 10 MG tablet Take '40mg'$  x 2 days,'20mg'$  x 2 days, '10mg'$  x2 days (Patient not taking: Reported on 03/06/2019) 14 tablet 0  . tamoxifen (NOLVADEX) 20 MG tablet Take 1 tablet (20 mg total) by mouth at bedtime. 90 tablet 4  . triamterene-hydrochlorothiazide (MAXZIDE-25) 37.5-25 MG tablet TAKE 1 TABLET BY MOUTH AT BEDTIME. STOP AMLODIPINE 30 tablet 3  . VENTOLIN HFA 108 (90 Base) MCG/ACT inhaler USE 1-2 PUFFS EVERY 6 HOURS AS NEEDED FOR WHEEZING 18 g 5   No current facility-administered medications for this visit.     OBJECTIVE: Middle-aged white woman who appears stated age  63:   06/06/19 1115  BP: 140/77  Pulse: 93  Resp: 18  Temp: 98.5 F (36.9 C)  SpO2: 95%     Body mass index is 24.34 kg/m.    ECOG FS:1 - Symptomatic but completely ambulatory  Sclerae unicteric, EOMs intact Wearing a mask No cervical or supraclavicular adenopathy Lungs no rales or rhonchi Heart regular rate and rhythm Abd soft, nontender, positive bowel sounds MSK no focal spinal tenderness, no upper extremity lymphedema Neuro: nonfocal, well oriented, appropriate affect Breasts: Status post bilateral mastectomies.  The chest wall on the right is fairly tight and numb but otherwise unremarkable.  The left side is unremarkable.  Both axillae are benign.   LAB RESULTS:  CMP     Component Value Date/Time   NA 136 06/20/2018 0930   NA 137 07/13/2017 1035   K 3.5 06/20/2018 0930   K 4.3 07/13/2017 1035   CL 99 06/20/2018 0930   CO2 26 06/20/2018 0930   CO2 27 07/13/2017 1035   GLUCOSE 128  (H) 06/20/2018 0930   GLUCOSE 94 07/13/2017 1035   BUN 18 06/20/2018 0930   BUN 15.4 07/13/2017 1035   CREATININE 1.30 (H) 06/20/2018 0930   CREATININE 0.95 12/15/2017 1541   CREATININE 0.8 07/13/2017 1035   CALCIUM 8.8 (L) 06/20/2018 0930   CALCIUM 9.3 07/13/2017 1035   PROT 7.7 06/20/2018 0930   PROT 7.7 07/13/2017 1035   ALBUMIN 3.6 06/20/2018 0930   ALBUMIN 3.5 07/13/2017 1035   AST 25 06/20/2018 0930   AST 15 07/13/2017 1035   ALT 26 06/20/2018 0930   ALT 14 07/13/2017 1035   ALKPHOS 52 06/20/2018 0930   ALKPHOS 73 07/13/2017 1035   BILITOT 0.2 (L) 06/20/2018 0930   BILITOT 0.22 07/13/2017 1035   GFRNONAA 43 (L) 06/20/2018 0930   GFRNONAA 64 12/15/2017 1541   GFRAA 50 (L) 06/20/2018 0930   GFRAA 74 12/15/2017 1541    No results found for: TOTALPROTELP, ALBUMINELP, A1GS, A2GS, BETS, BETA2SER, GAMS, MSPIKE, SPEI  No results found for: KPAFRELGTCHN, LAMBDASER, [facility]  Lab Results  Component Value Date   WBC 7.1 06/06/2019   NEUTROABS 5.0 06/06/2019   HGB 12.5 06/06/2019   HCT 37.5 06/06/2019   MCV 91.5 06/06/2019   PLT 170 06/06/2019      Chemistry      Component Value Date/Time   NA 136 06/20/2018 0930   NA 137 07/13/2017 1035   K 3.5 06/20/2018 0930   K 4.3 07/13/2017 1035   CL 99 06/20/2018 0930   CO2 26 06/20/2018 0930   CO2 27 07/13/2017 1035   BUN 18 06/20/2018 0930   BUN 15.4 07/13/2017 1035   CREATININE 1.30 (H) 06/20/2018 0930   CREATININE 0.95 12/15/2017 1541   CREATININE 0.8 07/13/2017  1035      Component Value Date/Time   CALCIUM 8.8 (L) 06/20/2018 0930   CALCIUM 9.3 07/13/2017 1035   ALKPHOS 52 06/20/2018 0930   ALKPHOS 73 07/13/2017 1035   AST 25 06/20/2018 0930   AST 15 07/13/2017 1035   ALT 26 06/20/2018 0930   ALT 14 07/13/2017 1035   BILITOT 0.2 (L) 06/20/2018 0930   BILITOT 0.22 07/13/2017 1035       No results found for: LABCA2  No components found for: VHQION629  No results for input(s): INR in the last 168  hours.  Urinalysis No results found for: COLORURINE, APPEARANCEUR, LABSPEC, PHURINE, GLUCOSEU, HGBUR, BILIRUBINUR, KETONESUR, PROTEINUR, UROBILINOGEN, NITRITE, LEUKOCYTESUR   STUDIES: No results found.   ELIGIBLE FOR AVAILABLE RESEARCH PROTOCOL: no  ASSESSMENT: 63 y.o. Krystal Brooks woman, with bilateral breast cancer  (1) status post right lumpectomy and sentinel lymph node sampling 01/27/2016 for lobular carcinoma in situ, pleomorphic variant, with multiple foci of invasive lobular carcinoma, all less than a millimeter, but with negative margins and both sentinel lymph nodes negative, invasive disease being estrogen receptor positive, HER-2 not tested (pT1 [mic] pN0}  (2) adjuvant radiation 03/09/2016 through 04/19/2016 The patient initially received a dose of 50Gy in 78factions to the breast using whole-breast tangent fields. This was delivered using a 3-D conformal technique. The patient then received a boost to the seroma. This delivered an additional 10Gy in 528fctions using an en face electron field due to the depth of the seroma. The total dose was 60Gy.  (3) anastrozole started June 2017, discontinued July 2018  (4) status post left breast lower inner quadrant biopsy 12/29/2016 for a clinical T1c N0  invasive lobular breast cancer, grade 2, estrogen receptor positive, progesterone receptor negative HER-2 nonamplified, with a signals ratio 1.35 and the number per cell 2.70.  (5) intermediate Oncotype score of 22 predicts a 10 year risk of recurrence outside the breast of 14% if the patient's only systemic therapy is tamoxifen for 5 years.  (a) the patient opted against chemotherapy (TLovena Lex results confirm choice)  (6) status post bilateral mastectomies and left sentinel lymph node sampling 03/16/2017 showing  (a) on the right, no evidence of carcinoma  (b) on the left, a pT2 pN0, stage IIA invasive lobular carcinoma, grade 2, with negative margins.  (7) had immediate  expander placement at the time of bilateral mastectomies  (a) definitive implant placement with bilateral capsulotomies June 15, 2017, with benign pathology  (b) implants removed February 2019 because of chest discomfort  (c) status post right latissimus flap reconstruction and left capsulotomy 01/30/2018 with benign pathology  (d) decided to have all implants removed with no further reconstruction.  (Per Dr. BiDalbert Mayotteote 03/06/2019).  (8) started tamoxifen 04/30/2017  PLAN: MaDalylahs now a little over 2 years out from definitive surgery for her breast cancer with no evidence of disease recurrence.  This is very favorable.  She is tolerating tamoxifen well and the plan will be to continue that for a total of 10 years given that we are dealing with a lobular breast cancer and does tend to recur late.  I have encouraged her to exercise.  We discussed some cosmetic issues related to her reconstruction.  She is going to see me again in a year.  She knows to call for any problems that may develop before that visit.  Elenore Wanninger, GuVirgie DadMD  06/06/19 11:24 AM Medical Oncology and Hematology [facility] [place] Tel. [phone]    Fax. [phone]    I, Wilburn Mylar am acting as scribe for Dr. Virgie Dad. Roverto Bodmer.  I, Lurline Del MD, have reviewed the above documentation for accuracy and completeness, and I agree with the above.

## 2019-06-06 ENCOUNTER — Inpatient Hospital Stay: Payer: 59 | Attending: Oncology | Admitting: Oncology

## 2019-06-06 ENCOUNTER — Other Ambulatory Visit: Payer: Self-pay

## 2019-06-06 ENCOUNTER — Inpatient Hospital Stay: Payer: 59

## 2019-06-06 VITALS — BP 140/77 | HR 93 | Temp 98.5°F | Resp 18 | Ht 61.0 in | Wt 128.8 lb

## 2019-06-06 DIAGNOSIS — C50311 Malignant neoplasm of lower-inner quadrant of right female breast: Secondary | ICD-10-CM | POA: Diagnosis not present

## 2019-06-06 DIAGNOSIS — I10 Essential (primary) hypertension: Secondary | ICD-10-CM | POA: Diagnosis not present

## 2019-06-06 DIAGNOSIS — Z923 Personal history of irradiation: Secondary | ICD-10-CM | POA: Diagnosis not present

## 2019-06-06 DIAGNOSIS — J45909 Unspecified asthma, uncomplicated: Secondary | ICD-10-CM | POA: Insufficient documentation

## 2019-06-06 DIAGNOSIS — C50212 Malignant neoplasm of upper-inner quadrant of left female breast: Secondary | ICD-10-CM | POA: Diagnosis not present

## 2019-06-06 DIAGNOSIS — Z9013 Acquired absence of bilateral breasts and nipples: Secondary | ICD-10-CM | POA: Insufficient documentation

## 2019-06-06 DIAGNOSIS — R232 Flushing: Secondary | ICD-10-CM | POA: Insufficient documentation

## 2019-06-06 DIAGNOSIS — Z800 Family history of malignant neoplasm of digestive organs: Secondary | ICD-10-CM | POA: Diagnosis not present

## 2019-06-06 DIAGNOSIS — Z170 Estrogen receptor positive status [ER+]: Secondary | ICD-10-CM

## 2019-06-06 DIAGNOSIS — Z79810 Long term (current) use of selective estrogen receptor modulators (SERMs): Secondary | ICD-10-CM | POA: Insufficient documentation

## 2019-06-06 DIAGNOSIS — Z90710 Acquired absence of both cervix and uterus: Secondary | ICD-10-CM | POA: Insufficient documentation

## 2019-06-06 DIAGNOSIS — Z79899 Other long term (current) drug therapy: Secondary | ICD-10-CM | POA: Diagnosis not present

## 2019-06-06 DIAGNOSIS — Z87891 Personal history of nicotine dependence: Secondary | ICD-10-CM | POA: Insufficient documentation

## 2019-06-06 LAB — COMPREHENSIVE METABOLIC PANEL
ALT: 25 U/L (ref 0–44)
AST: 23 U/L (ref 15–41)
Albumin: 3.8 g/dL (ref 3.5–5.0)
Alkaline Phosphatase: 55 U/L (ref 38–126)
Anion gap: 11 (ref 5–15)
BUN: 21 mg/dL (ref 8–23)
CO2: 26 mmol/L (ref 22–32)
Calcium: 9.1 mg/dL (ref 8.9–10.3)
Chloride: 101 mmol/L (ref 98–111)
Creatinine, Ser: 1.02 mg/dL — ABNORMAL HIGH (ref 0.44–1.00)
GFR calc Af Amer: >60 mL/min (ref >60)
GFR calc non Af Amer: 58 mL/min — ABNORMAL LOW (ref >60)
Glucose, Bld: 141 mg/dL — ABNORMAL HIGH (ref 70–99)
Potassium: 3.4 mmol/L — ABNORMAL LOW (ref 3.5–5.1)
Sodium: 138 mmol/L (ref 135–145)
Total Bilirubin: 0.3 mg/dL (ref 0.3–1.2)
Total Protein: 7.4 g/dL (ref 6.5–8.1)

## 2019-06-06 LAB — CBC WITH DIFFERENTIAL/PLATELET
Abs Immature Granulocytes: 0.03 10*3/uL (ref 0.00–0.07)
Basophils Absolute: 0 10*3/uL (ref 0.0–0.1)
Basophils Relative: 0 %
Eosinophils Absolute: 0.1 10*3/uL (ref 0.0–0.5)
Eosinophils Relative: 1 %
HCT: 37.5 % (ref 36.0–46.0)
Hemoglobin: 12.5 g/dL (ref 12.0–15.0)
Immature Granulocytes: 0 %
Lymphocytes Relative: 21 %
Lymphs Abs: 1.5 10*3/uL (ref 0.7–4.0)
MCH: 30.5 pg (ref 26.0–34.0)
MCHC: 33.3 g/dL (ref 30.0–36.0)
MCV: 91.5 fL (ref 80.0–100.0)
Monocytes Absolute: 0.5 10*3/uL (ref 0.1–1.0)
Monocytes Relative: 7 %
Neutro Abs: 5 10*3/uL (ref 1.7–7.7)
Neutrophils Relative %: 71 %
Platelets: 170 10*3/uL (ref 150–400)
RBC: 4.1 MIL/uL (ref 3.87–5.11)
RDW: 13.1 % (ref 11.5–15.5)
WBC: 7.1 10*3/uL (ref 4.0–10.5)
nRBC: 0 % (ref 0.0–0.2)

## 2019-06-06 MED ORDER — TAMOXIFEN CITRATE 20 MG PO TABS: 20.0000 mg | ORAL_TABLET | Freq: Every day | ORAL | 4 refills | Status: DC

## 2019-06-07 ENCOUNTER — Telehealth: Payer: Self-pay

## 2019-06-07 ENCOUNTER — Telehealth: Payer: Self-pay | Admitting: Oncology

## 2019-06-07 NOTE — Telephone Encounter (Signed)
-----   Message from Gardenia Phlegm, NP sent at 06/06/2019  9:53 PM EDT ----- Potassium is mildly decreased, please call patient and recommend that she increase her potassium intake ----- Message ----- From: Buel Ream, Lab In [place] Sent: 06/06/2019  11:22 AM EDT To: Chauncey Cruel, MD

## 2019-06-07 NOTE — Telephone Encounter (Signed)
Spoke with patient to inform that potassium level is slightly low.  Recommended to increase potassium through diet.  Patient voiced understanding and thanks for call.

## 2019-06-07 NOTE — Telephone Encounter (Signed)
I talk with patient regarding schedule  

## 2019-07-01 ENCOUNTER — Other Ambulatory Visit: Payer: Self-pay | Admitting: Oncology

## 2019-08-13 ENCOUNTER — Encounter: Payer: Self-pay | Admitting: Family Medicine

## 2019-08-15 ENCOUNTER — Other Ambulatory Visit: Payer: Self-pay | Admitting: Family Medicine

## 2019-08-15 DIAGNOSIS — I10 Essential (primary) hypertension: Secondary | ICD-10-CM

## 2019-08-17 ENCOUNTER — Other Ambulatory Visit: Payer: Self-pay | Admitting: Family Medicine

## 2019-10-05 ENCOUNTER — Ambulatory Visit: Payer: 59

## 2019-10-12 ENCOUNTER — Other Ambulatory Visit: Payer: Self-pay

## 2019-10-12 ENCOUNTER — Ambulatory Visit (INDEPENDENT_AMBULATORY_CARE_PROVIDER_SITE_OTHER): Payer: 59 | Admitting: Family Medicine

## 2019-10-12 DIAGNOSIS — J0100 Acute maxillary sinusitis, unspecified: Secondary | ICD-10-CM

## 2019-10-12 MED ORDER — AZITHROMYCIN 250 MG PO TABS: ORAL_TABLET | ORAL | 0 refills | Status: DC

## 2019-10-12 NOTE — Progress Notes (Signed)
Subjective:    Patient ID: [name], female    DOB: Jun 07, 1956, 64 y.o.   MRN: [id number]  HPI Is a very pleasant 64 year old Caucasian female who presents today complaining of a sinus infection.  Patient is being seen today as a telephone visit.  She consents to be seen via telephone.  Phone call began at 1038.  Phone call concluded at 1047.  Patient states that she has had pain in her sinuses for approximately 1 week.  It began as a head cold.  However now she is having constant pain above her left eye, in her left cheekbone, and with drainage going down her left side of her throat.  She complains of pain in her upper teeth on the left side.  She reports a dull constant headache with fever.  She denies any cough.  She denies any chest pain.  She denies any shortness of breath.  She has had her second Covid vaccination and therefore the likelihood of this being COVID-19 is low.  She denies any nausea vomiting or abdominal pain. Past Medical History:  Diagnosis Date  . Adie's pupil    LEFT EYE  . Allergy   . Asthma   . Atherosclerosis of aorta (HCC)    bulge above renal arteries on mri 2016  . Cancer [facility]) 2017   right breast ca  . Cellulitis    chest  . Complication of anesthesia    VERY HARD TO WAKE UP    . Hypertension   . Personal history of radiation therapy   . PONV (postoperative nausea and vomiting)   . Smoker    Past Surgical History:  Procedure Laterality Date  . ABDOMINAL HYSTERECTOMY    . BREAST BIOPSY Right 01/02/2016  . BREAST EXCISIONAL BIOPSY Right 1979  . BREAST IMPLANT REMOVAL Bilateral 10/05/2017   Procedure: [id number] BILATERAL BREAST IMPLANTS;  Surgeon: Wallace Going, DO;  Location: [place];  Service: Plastics;  Laterality: Bilateral;  . BREAST LUMPECTOMY Right 01/27/2016  . BREAST LUMPECTOMY WITH RADIOACTIVE SEED AND SENTINEL LYMPH NODE BIOPSY Right 01/27/2016   Procedure: BREAST LUMPECTOMY WITH RADIOACTIVE SEED AND SENTINEL LYMPH  NODE BIOPSY;  Surgeon: Excell Seltzer, MD;  Location: [place];  Service: General;  Laterality: Right;  . BREAST RECONSTRUCTION WITH PLACEMENT OF TISSUE EXPANDER AND FLEX HD (ACELLULAR HYDRATED DERMIS) Bilateral 03/16/2017   Procedure: BILATERAL IMMEDIATE BREAST RECONSTRUCTION WITH PLACEMENT OF TISSUE EXPANDER AND FLEX HD (ACELLULAR HYDRATED DERMIS);  Surgeon: Wallace Going, DO;  Location: [place];  Service: Plastics;  Laterality: Bilateral;  . BREAST SURGERY  1979   cyst removed rt breast  . CATARACT EXTRACTION W/ INTRAOCULAR LENS IMPLANT     RT EYE  . CESAREAN SECTION     x2  . EXCISION OF BREAST LESION Left 01/30/2018   Procedure: EXCISION OF LEFT BREAST SCAR;  Surgeon: Wallace Going, DO;  Location: [place];  Service: Plastics;  Laterality: Left;  . LATISSIMUS FLAP TO BREAST Right 01/30/2018   Procedure: RIGHT LATISSIMUS MYOCUTANEOUS FLAP FOR RIGHT CHEST WALL RECONSTRUCTION;  Surgeon: Wallace Going, DO;  Location: [place];  Service: Plastics;  Laterality: Right;  . MASTECTOMY W/ SENTINEL NODE BIOPSY Bilateral 03/16/2017   Procedure: BILATERAL TOTAL MASTECTOMY WITH LEFT SENTINEL LYMPH NODE BIOPSY;  Surgeon: Excell Seltzer, MD;  Location: [place];  Service: General;  Laterality: Bilateral;  . REMOVAL OF BILATERAL TISSUE EXPANDERS WITH PLACEMENT OF BILATERAL BREAST IMPLANTS Bilateral 06/15/2017   Procedure: REMOVAL  OF BILATERAL TISSUE EXPANDERS WITH PLACEMENT OF BILATERAL BREAST IMPLANTS;  Surgeon: Wallace Going, DO;  Location: [place];  Service: Plastics;  Laterality: Bilateral;  . TOTAL MASTECTOMY Bilateral 03/16/2017   SENTINAL NODE BIOPSY   Current Outpatient Medications on File Prior to Visit  Medication Sig Dispense Refill  . ANORO ELLIPTA 62.5-25 MCG/INH AEPB INHALE 1 PUFF BY MOUTH EVERY DAY 60 each 10  . Ascorbic Acid (VITAMIN C) 1000 MG tablet Take 1,000 mg by mouth 2 (two) times daily.     . fluticasone (FLONASE) 50 MCG/ACT  nasal spray Place 2 sprays into both nostrils daily. (Patient not taking: Reported on 03/06/2019) 16 g 6  . losartan (COZAAR) 100 MG tablet TAKE 1 TABLET BY MOUTH EVERY DAY 90 tablet 3  . meclizine (ANTIVERT) 25 MG tablet Take 1 tablet (25 mg total) by mouth 3 (three) times daily as needed for dizziness. 30 tablet 0  . montelukast (SINGULAIR) 10 MG tablet Take 1 tablet (10 mg total) by mouth daily. 90 tablet 3  . Multiple Vitamin (MULTIVITAMIN WITH MINERALS) TABS tablet Take 1 tablet by mouth daily.    . tamoxifen (NOLVADEX) 20 MG tablet TAKE 1 TABLET BY MOUTH EVERYDAY AT BEDTIME 90 tablet 0  . triamterene-hydrochlorothiazide (MAXZIDE-25) 37.5-25 MG tablet TAKE 1 TABLET BY MOUTH AT BEDTIME. STOP AMLODIPINE 90 tablet 3  . VENTOLIN HFA 108 (90 Base) MCG/ACT inhaler USE 1-2 PUFFS EVERY 6 HOURS AS NEEDED FOR WHEEZING 18 g 5   No current facility-administered medications on file prior to visit.   Allergies  Allergen Reactions  . Penicillins Swelling, Rash and Other (See Comments)    Older sister died from reaction to penicillin Has patient had a PCN reaction causing immediate rash, facial/tongue/throat swelling, SOB or lightheadedness with hypotension: Yes Has patient had a PCN reaction causing severe rash involving mucus membranes or skin necrosis: No Has patient had a PCN reaction that required hospitalization: No Has patient had a PCN reaction occurring within the last 10 years: No If all of the above answers are "NO", then may proceed with Cephalosporin use.   . Sulfa Antibiotics Rash  . Levaquin [Levofloxacin] Nausea And Vomiting   Social History   Socioeconomic History  . Marital status: Married    Spouse name: Not on file  . Number of children: Not on file  . Years of education: Not on file  . Highest education level: Not on file  Occupational History  . Not on file  Tobacco Use  . Smoking status: Former Smoker    Packs/day: 0.50    Years: 40.00    Pack years: 20.00    Types:  Cigarettes  . Smokeless tobacco: Never Used  Substance and Sexual Activity  . Alcohol use: Yes    Alcohol/week: 0.0 standard drinks    Comment: social  . Drug use: No  . Sexual activity: Yes    Birth control/protection: Surgical    Comment: hysterectomy  Other Topics Concern  . Not on file  Social History Narrative  . Not on file   Social Determinants of Health   Financial Resource Strain:   . Difficulty of Paying Living Expenses: Not on file  Food Insecurity:   . Worried About Charity fundraiser in the Last Year: Not on file  . Ran Out of Food in the Last Year: Not on file  Transportation Needs:   . Lack of Transportation (Medical): Not on file  . Lack of Transportation (Non-Medical): Not on  file  Physical Activity:   . Days of Exercise per Week: Not on file  . Minutes of Exercise per Session: Not on file  Stress:   . Feeling of Stress : Not on file  Social Connections:   . Frequency of Communication with Friends and Family: Not on file  . Frequency of Social Gatherings with Friends and Family: Not on file  . Attends Religious Services: Not on file  . Active Member of Clubs or Organizations: Not on file  . Attends Archivist Meetings: Not on file  . Marital Status: Not on file  Intimate Partner Violence:   . Fear of Current or Ex-Partner: Not on file  . Emotionally Abused: Not on file  . Physically Abused: Not on file  . Sexually Abused: Not on file      Review of Systems  All other systems reviewed and are negative.      Objective:   Physical Exam        Assessment & Plan:  Acute maxillary sinusitis, recurrence not specified  Patient symptoms sound like she has a sinus infection in her left maxillary sinus.  She is already using Sudafed and Flonase.  I recommended that she continue these.  However I will also suggest that we had a Z-Pak.  Also recommended she use facilitate drainage of her sinus cavity.  Reassess if no better in 1 week or  sooner if worsening.

## 2019-12-04 ENCOUNTER — Ambulatory Visit (INDEPENDENT_AMBULATORY_CARE_PROVIDER_SITE_OTHER): Payer: Medicare HMO | Admitting: Nurse Practitioner

## 2019-12-04 ENCOUNTER — Other Ambulatory Visit: Payer: Self-pay

## 2019-12-04 DIAGNOSIS — J45901 Unspecified asthma with (acute) exacerbation: Secondary | ICD-10-CM

## 2019-12-04 DIAGNOSIS — J302 Other seasonal allergic rhinitis: Secondary | ICD-10-CM

## 2019-12-04 MED ORDER — AZITHROMYCIN 250 MG PO TABS: ORAL_TABLET | ORAL | 0 refills | Status: DC

## 2019-12-04 MED ORDER — PREDNISONE 10 MG PO TABS: ORAL_TABLET | ORAL | 0 refills | Status: DC

## 2019-12-04 MED ORDER — FLUTICASONE PROPIONATE 50 MCG/ACT NA SUSP: 2.0000 | Freq: Every day | NASAL | 6 refills | Status: DC

## 2019-12-04 NOTE — Progress Notes (Signed)
Virtual Visit via Telephone Note  I connected with [name] on 12/04/19 at  2:00 PM EDT by telephone and verified that I am speaking with the correct person using two identifiers.   I discussed the limitations, risks, security and privacy concerns of performing an evaluation and management service by telephone and the availability of in person appointments. I also discussed with the patient that there may be a patient responsible charge related to this service. The patient expressed understanding and agreed to proceed.   History of Present Illness: Pt is a 64 y.o. female presenting for a telephone encounter Sxs started Sunday night of mild cough, nasal congestion, chest congestion, and has advanced to today of gradual worsening to include yellow/green phlegm from nose and cough. She has h/o asthma and seasonal allergies. She feels that she had seasonal allergy response and having asthma exacerbation now as sxs are near the same. She has continued to adhere to asthma medication regimen. She has no contacts that are sick/ill and no COVID contacts. She had last COVID vaccine 4 weeks ago. No fever/.chills,  No loss of taste of smell.   Observations/Objective: Mild cough heard while on phone, no sob or audible wheezing, a/ox4, pt able to speak full sentences without difficulty.  Assessment and Plan: Your symptoms are consistent with asthma exacerbation most likely secondary to seasonal allergy response.  Take and complete the antibiotic and prednisone prescription. Continue to take your maintenance asthma medication you stated no need for refills.  Seek medical attention for non resolving or worsening symptoms such as shortness of breath. Refill Flonase as you stated taking but RX stopped stating you had stopped, continue.  Follow Up Instructions:    I discussed the assessment and treatment plan with the patient. The patient was provided an opportunity to ask questions and all were answered.  The patient agreed with the plan and demonstrated an understanding of the instructions.   The patient was advised to call back or seek an in-person evaluation if the symptoms worsen or if the condition fails to improve as anticipated.  I provided 10 minutes of non-face-to-face time during this encounter.   Annie Main, FNP

## 2019-12-10 ENCOUNTER — Ambulatory Visit (INDEPENDENT_AMBULATORY_CARE_PROVIDER_SITE_OTHER): Payer: Medicare HMO | Admitting: Nurse Practitioner

## 2019-12-10 ENCOUNTER — Other Ambulatory Visit: Payer: Self-pay

## 2019-12-10 VITALS — BP 118/64 | HR 110 | Temp 97.7°F | Resp 19 | Wt 125.0 lb

## 2019-12-10 DIAGNOSIS — J45901 Unspecified asthma with (acute) exacerbation: Secondary | ICD-10-CM

## 2019-12-10 DIAGNOSIS — R69 Illness, unspecified: Secondary | ICD-10-CM | POA: Diagnosis not present

## 2019-12-10 MED ORDER — IPRATROPIUM-ALBUTEROL 0.5-2.5 (3) MG/3ML IN SOLN
3.0000 mL | Freq: Once | RESPIRATORY_TRACT | Status: AC
  Administered 2019-12-10: 3 mL via RESPIRATORY_TRACT

## 2019-12-10 MED ORDER — BUDESONIDE-FORMOTEROL FUMARATE 80-4.5 MCG/ACT IN AERO: 2.0000 | INHALATION_SPRAY | Freq: Two times a day (BID) | RESPIRATORY_TRACT | 3 refills | Status: DC

## 2019-12-10 MED ORDER — ALBUTEROL SULFATE (2.5 MG/3ML) 0.083% IN NEBU: 2.5000 mg | INHALATION_SOLUTION | Freq: Four times a day (QID) | RESPIRATORY_TRACT | 1 refills | Status: DC | PRN

## 2019-12-10 MED ORDER — PREDNISONE 10 MG PO TABS: ORAL_TABLET | ORAL | 0 refills | Status: DC

## 2019-12-10 NOTE — Patient Instructions (Addendum)
Stop Anora inhaler and start New Symbicort inhaler New home nebulizer as needed for shortness of breath or wheezing in place of albuterol inhaler. May use Albuterol inhaler outside of the home as needed and directed.  New prednisone dose pack taper as directed Seek medical attention for non resolving or worsening symptoms Get a COVID test today

## 2019-12-10 NOTE — Progress Notes (Signed)
Acute Office Visit  Subjective:    Patient ID: Krystal Brooks, female    DOB: March 28, 1956, 64 y.o.   MRN: [id number]  Chief Complaint  Patient presents with  . Asthma    HPI Patient is a 64 year old female presenting for worsening continued sxs of asthma exacerbation. Today she reports after the telephone visit 12/04/2019 that she completed her treatment however the sxs have persisted. She reports wheezing today and cc of needing a nebulizer. She admits to compliance with her asthma tx plan. She denied completing a COVID test reporting she has had 2 vaccines, does not go outside the home, orders and picks up her groceries and feels that she could not have COVID virus. She has h/o asthma. She is using her albuterol inhaler several times a day. Feels short of breath today bringing her to the clinic. No cp/ct, gu/gi , pain, edema. No contacts with sickness.    2 duo nebs resolved wheezing and decreased oxygen saturations. Acute distress resolved.  Past Medical History:  Diagnosis Date  . Adie's pupil    LEFT EYE  . Allergy   . Asthma   . Atherosclerosis of aorta (HCC)    bulge above renal arteries on mri 2016  . Cancer [facility]) 2017   right breast ca  . Cellulitis    chest  . Complication of anesthesia    VERY HARD TO WAKE UP    . Hypertension   . Personal history of radiation therapy   . PONV (postoperative nausea and vomiting)   . Smoker     Past Surgical History:  Procedure Laterality Date  . ABDOMINAL HYSTERECTOMY    . BREAST BIOPSY Right 01/02/2016  . BREAST EXCISIONAL BIOPSY Right 1979  . BREAST IMPLANT REMOVAL Bilateral 10/05/2017   Procedure: [id number] BILATERAL BREAST IMPLANTS;  Surgeon: Wallace Going, DO;  Location: [place];  Service: Plastics;  Laterality: Bilateral;  . BREAST LUMPECTOMY Right 01/27/2016  . BREAST LUMPECTOMY WITH RADIOACTIVE SEED AND SENTINEL LYMPH NODE BIOPSY Right 01/27/2016   Procedure: BREAST LUMPECTOMY WITH RADIOACTIVE  SEED AND SENTINEL LYMPH NODE BIOPSY;  Surgeon: Excell Seltzer, MD;  Location: [place];  Service: General;  Laterality: Right;  . BREAST RECONSTRUCTION WITH PLACEMENT OF TISSUE EXPANDER AND FLEX HD (ACELLULAR HYDRATED DERMIS) Bilateral 03/16/2017   Procedure: BILATERAL IMMEDIATE BREAST RECONSTRUCTION WITH PLACEMENT OF TISSUE EXPANDER AND FLEX HD (ACELLULAR HYDRATED DERMIS);  Surgeon: Wallace Going, DO;  Location: [place];  Service: Plastics;  Laterality: Bilateral;  . BREAST SURGERY  1979   cyst removed rt breast  . CATARACT EXTRACTION W/ INTRAOCULAR LENS IMPLANT     RT EYE  . CESAREAN SECTION     x2  . EXCISION OF BREAST LESION Left 01/30/2018   Procedure: EXCISION OF LEFT BREAST SCAR;  Surgeon: Wallace Going, DO;  Location: [place];  Service: Plastics;  Laterality: Left;  . LATISSIMUS FLAP TO BREAST Right 01/30/2018   Procedure: RIGHT LATISSIMUS MYOCUTANEOUS FLAP FOR RIGHT CHEST WALL RECONSTRUCTION;  Surgeon: Wallace Going, DO;  Location: [place];  Service: Plastics;  Laterality: Right;  . MASTECTOMY W/ SENTINEL NODE BIOPSY Bilateral 03/16/2017   Procedure: BILATERAL TOTAL MASTECTOMY WITH LEFT SENTINEL LYMPH NODE BIOPSY;  Surgeon: Excell Seltzer, MD;  Location: [place];  Service: General;  Laterality: Bilateral;  . REMOVAL OF BILATERAL TISSUE EXPANDERS WITH PLACEMENT OF BILATERAL BREAST IMPLANTS Bilateral 06/15/2017   Procedure: REMOVAL OF BILATERAL TISSUE EXPANDERS WITH PLACEMENT OF BILATERAL BREAST  IMPLANTS;  Surgeon: Wallace Going, DO;  Location: [place];  Service: Plastics;  Laterality: Bilateral;  . TOTAL MASTECTOMY Bilateral 03/16/2017   SENTINAL NODE BIOPSY    Family History  Problem Relation Age of Onset  . Hearing loss Mother   . Asthma Father   . Cancer Father        melanoma, metastases   . Diabetes Father   . Heart disease Father   . Early death Sister   . Early death Paternal Grandfather   . Cancer Maternal  Grandmother 88       colon cancer   . Breast cancer Neg Hx   . Ovarian cancer Neg Hx     Social History   Socioeconomic History  . Marital status: Married    Spouse name: Not on file  . Number of children: Not on file  . Years of education: Not on file  . Highest education level: Not on file  Occupational History  . Not on file  Tobacco Use  . Smoking status: Former Smoker    Packs/day: 0.50    Years: 40.00    Pack years: 20.00    Types: Cigarettes  . Smokeless tobacco: Never Used  Substance and Sexual Activity  . Alcohol use: Yes    Alcohol/week: 0.0 standard drinks    Comment: social  . Drug use: No  . Sexual activity: Yes    Birth control/protection: Surgical    Comment: hysterectomy  Other Topics Concern  . Not on file  Social History Narrative  . Not on file   Social Determinants of Health   Financial Resource Strain:   . Difficulty of Paying Living Expenses:   Food Insecurity:   . Worried About Charity fundraiser in the Last Year:   . Arboriculturist in the Last Year:   Transportation Needs:   . Film/video editor (Medical):   Marland Kitchen Lack of Transportation (Non-Medical):   Physical Activity:   . Days of Exercise per Week:   . Minutes of Exercise per Session:   Stress:   . Feeling of Stress :   Social Connections:   . Frequency of Communication with Friends and Family:   . Frequency of Social Gatherings with Friends and Family:   . Attends Religious Services:   . Active Member of Clubs or Organizations:   . Attends Archivist Meetings:   Marland Kitchen Marital Status:   Intimate Partner Violence:   . Fear of Current or Ex-Partner:   . Emotionally Abused:   Marland Kitchen Physically Abused:   . Sexually Abused:     Outpatient Medications Prior to Visit  Medication Sig Dispense Refill  . fluticasone (FLONASE) 50 MCG/ACT nasal spray Place 2 sprays into both nostrils daily. 16 g 6  . losartan (COZAAR) 100 MG tablet TAKE 1 TABLET BY MOUTH EVERY DAY 90 tablet 3  .  montelukast (SINGULAIR) 10 MG tablet Take 1 tablet (10 mg total) by mouth daily. 90 tablet 3  . Multiple Vitamin (MULTIVITAMIN WITH MINERALS) TABS tablet Take 1 tablet by mouth daily.    . tamoxifen (NOLVADEX) 20 MG tablet TAKE 1 TABLET BY MOUTH EVERYDAY AT BEDTIME 90 tablet 0  . triamterene-hydrochlorothiazide (MAXZIDE-25) 37.5-25 MG tablet TAKE 1 TABLET BY MOUTH AT BEDTIME. STOP AMLODIPINE 90 tablet 3  . VENTOLIN HFA 108 (90 Base) MCG/ACT inhaler USE 1-2 PUFFS EVERY 6 HOURS AS NEEDED FOR WHEEZING 18 g 5  . Ascorbic Acid (VITAMIN C) 1000 MG  tablet Take 1,000 mg by mouth 2 (two) times daily.     . meclizine (ANTIVERT) 25 MG tablet Take 1 tablet (25 mg total) by mouth 3 (three) times daily as needed for dizziness. (Patient not taking: Reported on 12/10/2019) 30 tablet 0  . ANORO ELLIPTA 62.5-25 MCG/INH AEPB INHALE 1 PUFF BY MOUTH EVERY DAY 60 each 10  . azithromycin (ZITHROMAX) 250 MG tablet 2 tabs poqday1, 1 tab poqday 2-5 6 tablet 0  . predniSONE (DELTASONE) 10 MG tablet Take 6 tabs day one, take 5 tabs day two, take 4 tabs day three, take 3 tabs day four, take 2 tabs day five, take 1 tab day six 21 tablet 0   No facility-administered medications prior to visit.    Allergies  Allergen Reactions  . Penicillins Swelling, Rash and Other (See Comments)    Older sister died from reaction to penicillin Has patient had a PCN reaction causing immediate rash, facial/tongue/throat swelling, SOB or lightheadedness with hypotension: Yes Has patient had a PCN reaction causing severe rash involving mucus membranes or skin necrosis: No Has patient had a PCN reaction that required hospitalization: No Has patient had a PCN reaction occurring within the last 10 years: No If all of the above answers are "NO", then may proceed with Cephalosporin use.   . Sulfa Antibiotics Rash  . Levaquin [Levofloxacin] Nausea And Vomiting    Review of Systems  All other systems reviewed and are negative.        Objective:    Physical Exam Vitals and nursing note reviewed.  Constitutional:      General: She is in acute distress.     Appearance: Normal appearance. She is well-developed and well-groomed.  HENT:     Head: Normocephalic.     Nose: Nose normal.     Mouth/Throat:     Mouth: Mucous membranes are moist.     Pharynx: Oropharynx is clear.  Eyes:     General: Lids are normal. Lids are everted, no foreign bodies appreciated.     Extraocular Movements: Extraocular movements intact.     Conjunctiva/sclera: Conjunctivae normal.     Pupils: Pupils are equal, round, and reactive to light.  Cardiovascular:     Rate and Rhythm: Normal rate and regular rhythm.     Pulses: Normal pulses.     Heart sounds: Normal heart sounds, S1 normal and S2 normal.  Pulmonary:     Effort: Pulmonary effort is normal.     Breath sounds: Wheezing present.     Comments: Effort and breath sounds normal after two nebulizer treatment. Abdominal:     General: Abdomen is flat.  Musculoskeletal:        General: Normal range of motion.     Cervical back: Normal range of motion and neck supple.     Right lower leg: No edema.     Left lower leg: No edema.  Skin:    General: Skin is warm and dry.     Capillary Refill: Capillary refill takes less than 2 seconds.  Neurological:     General: No focal deficit present.     Mental Status: She is alert and oriented to person, place, and time.  Psychiatric:        Mood and Affect: Mood normal.        Behavior: Behavior normal. Behavior is cooperative.     BP 118/64   Pulse (!) 110   Temp 97.7 F (36.5 C) (Temporal)   Resp 19  Wt 125 lb (56.7 kg)   SpO2 (!) 89%   BMI 23.62 kg/m  Wt Readings from Last 3 Encounters:  12/10/19 125 lb (56.7 kg)  06/06/19 128 lb 12.8 oz (58.4 kg)  03/06/19 126 lb (57.2 kg)    Health Maintenance Due  Topic Date Due  . Hepatitis C Screening  Never done  . PAP SMEAR-Modifier  08/31/2011  . MAMMOGRAM  12/28/2018    There  are no preventive care reminders to display for this patient.   Lab Results  Component Value Date   TSH 0.68 12/01/2017   Lab Results  Component Value Date   WBC 7.1 06/06/2019   HGB 12.5 06/06/2019   HCT 37.5 06/06/2019   MCV 91.5 06/06/2019   PLT 170 06/06/2019   Lab Results  Component Value Date   NA 138 06/06/2019   K 3.4 (L) 06/06/2019   CHLORIDE 101 07/13/2017   CO2 26 06/06/2019   GLUCOSE 141 (H) 06/06/2019   BUN 21 06/06/2019   CREATININE 1.02 (H) 06/06/2019   BILITOT 0.3 06/06/2019   ALKPHOS 55 06/06/2019   AST 23 06/06/2019   ALT 25 06/06/2019   PROT 7.4 06/06/2019   ALBUMIN 3.8 06/06/2019   CALCIUM 9.1 06/06/2019   ANIONGAP 11 06/06/2019   EGFR >60 07/13/2017   Lab Results  Component Value Date   CHOL 195 02/17/2015   Lab Results  Component Value Date   HDL 60 02/17/2015   Lab Results  Component Value Date   LDLCALC 119 (H) 02/17/2015   Lab Results  Component Value Date   TRIG 81 02/17/2015   Lab Results  Component Value Date   CHOLHDL 3.3 02/17/2015   No results found for: HGBA1C     Assessment & Plan:  Asthma exacerbation: Stop Anora inhaler and start New Symbicort inhaler New home nebulizer as needed for shortness of breath or wheezing in place of albuterol inhaler. May use Albuterol inhaler outside of the home as needed and directed.  New prednisone dose pack taper as directed Start taking over the counter guaifenesin as directed. Seek medical attention for non resolving or worsening symptoms Get a COVID test today  Problem List Items Addressed This Visit    None    Visit Diagnoses    Exacerbation of asthma, unspecified asthma severity, unspecified whether persistent    -  Primary   Relevant Medications   ipratropium-albuterol (DUONEB) 0.5-2.5 (3) MG/3ML nebulizer solution 3 mL (Completed)   albuterol (PROVENTIL) (2.5 MG/3ML) 0.083% nebulizer solution   budesonide-formoterol (SYMBICORT) 80-4.5 MCG/ACT inhaler   predniSONE  (DELTASONE) 10 MG tablet   ipratropium-albuterol (DUONEB) 0.5-2.5 (3) MG/3ML nebulizer solution 3 mL   Other Relevant Orders   For home use only DME Nebulizer machine     Follow Up: as needed for non resolving or worsening symptoms.  Annie Main, FNP

## 2019-12-10 NOTE — Progress Notes (Signed)
Walked patient 3 laps around office. O2 saturation started at 92% but decreased to 86% while ambulating. Pt returned to room and 2L via [place] placed back on patient

## 2019-12-10 NOTE — Progress Notes (Signed)
Patient ambulated around office x 2. Oxygen saturations remained around 90% without oxygen.

## 2019-12-12 ENCOUNTER — Other Ambulatory Visit: Payer: Self-pay | Admitting: Nurse Practitioner

## 2019-12-12 DIAGNOSIS — J45901 Unspecified asthma with (acute) exacerbation: Secondary | ICD-10-CM

## 2019-12-12 DIAGNOSIS — J45909 Unspecified asthma, uncomplicated: Secondary | ICD-10-CM

## 2019-12-12 MED ORDER — ALBUTEROL SULFATE (2.5 MG/3ML) 0.083% IN NEBU: 2.5000 mg | INHALATION_SOLUTION | Freq: Four times a day (QID) | RESPIRATORY_TRACT | 1 refills | Status: DC | PRN

## 2019-12-28 ENCOUNTER — Other Ambulatory Visit: Payer: Self-pay | Admitting: Nurse Practitioner

## 2019-12-28 DIAGNOSIS — J45901 Unspecified asthma with (acute) exacerbation: Secondary | ICD-10-CM

## 2019-12-28 DIAGNOSIS — J45909 Unspecified asthma, uncomplicated: Secondary | ICD-10-CM

## 2020-01-16 ENCOUNTER — Other Ambulatory Visit: Payer: Self-pay | Admitting: Family Medicine

## 2020-02-22 ENCOUNTER — Other Ambulatory Visit: Payer: Self-pay | Admitting: Family Medicine

## 2020-02-28 ENCOUNTER — Other Ambulatory Visit: Payer: Self-pay | Admitting: Family Medicine

## 2020-04-05 ENCOUNTER — Other Ambulatory Visit: Payer: Self-pay | Admitting: Nurse Practitioner

## 2020-04-05 DIAGNOSIS — J45901 Unspecified asthma with (acute) exacerbation: Secondary | ICD-10-CM

## 2020-06-04 NOTE — Progress Notes (Signed)
Krystal Brooks  [phone] [phone]     ID: Krystal Brooks DOB: 1956-01-06  MR#: [id number]  BJS#:[id number]  Patient Care Team: Susy Frizzle, MD as PCP - General (Family Medicine) Excell Seltzer, MD (Inactive) as Consulting Physician (General Surgery) Druscilla Brownie, MD as Consulting Physician (Dermatology) Dillingham, [name], DO as Attending Physician (Plastic Surgery) Jazziel Fitzsimmons, Virgie Dad, MD as Consulting Physician (Oncology) Delice Bison, Charlestine Massed, NP as Nurse Practitioner (Hematology and Oncology) OTHER MD:  CHIEF COMPLAINT: Recurrent estrogen receptor positive breast cancer (s/p bilateral mastectomies)  CURRENT TREATMENT: Tamoxifen   INTERVAL HISTORY: Krystal Brooks returns today for follow-up of her estrogen receptor positive breast cancer.   She continues on tamoxifen, with excellent tolerance.  She does not have problems with hot flashes or vaginal dryness.  Since her last visit, she has not undergone any additional studies.    REVIEW OF SYSTEMS: Krystal Brooks has had both her Pfizer vaccine doses and she would like to receive the booster here if possible. She exercises by walking about 15 to 20 minutes about 3 times a week.  She is very concerned because of her history of asthma and mild COPD regarding possible Covid infection and so she is still quarantining most of the time.  Detailed review of systems today was otherwise stable  BREAST CANCER HISTORY: From Dr Ernestina Penna 01/14/16 note:  "Krystal Brooks 64 y.o. female is here because of Her newly diagnosed right breast cancer. She is accompanied by her husband and of friend to our multidisciplinary rest clinic today.  This was found by screening mammogram,  she denies any palpable mass, skin change or nipple discharge, or ulcer constitutional symptoms before the screening.  Her prior mammo 6 years ago. She had a right breast cyst removed in 1979. "  Malignant neoplasm of lower-inner quadrant  of right breast of female, estrogen receptor positive ([place])   12/29/2015 Mammogram    Diagnostic right mammogram showed a 2.3 x 2.1 x 2.2 cm group of coarse calcification within the medial slightly lower right breast.      01/02/2016 Initial Diagnosis    Breast cancer of lower-inner quadrant of right female breast ([place])      01/02/2016 Initial Biopsy    right breast LIQ core needle biopsy showed LCIS, with a small focus of invasive lobular carcinoma, grade 2.       01/02/2016 Receptors her2    ER 10% positive, moderate staining. PR negative. Insufficient tissue for HER-2 testing.      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 0/2 SLN.       01/27/2016 Receptors her2    The surgical sample of the invasive carcinoma was not sufficient for Her2 test and other additional studies.      03/09/2016 - 04/19/2016 Radiation Therapy    Adjuvant breast radiation [name]). Right breast: 50 Gy in 25 fractions. Right breast "boost": 10 Gy in 5 fractions.        04/2016 -  Anti-estrogen oral therapy    Anastrozole 1 mg daily. Planned duration of therapy: 5 years       PAST MEDICAL HISTORY: Past Medical History:  Diagnosis Date  . Adie's pupil    LEFT EYE  .  Allergy   . Asthma   . Atherosclerosis of aorta (HCC)    bulge above renal arteries on mri 2016  . Cancer [facility]) 2017   right breast ca  . Cellulitis    chest  . Complication of anesthesia    VERY HARD TO WAKE UP    . Hypertension   . Personal history of radiation therapy   . PONV (postoperative nausea and vomiting)   . Smoker     PAST SURGICAL HISTORY: Past Surgical History:  Procedure Laterality Date  .  ABDOMINAL HYSTERECTOMY    . BREAST BIOPSY Right 01/02/2016  . BREAST EXCISIONAL BIOPSY Right 1979  . BREAST IMPLANT REMOVAL Bilateral 10/05/2017   Procedure: [id number] BILATERAL BREAST IMPLANTS;  Surgeon: Wallace Going, DO;  Location: [place];  Service: Plastics;  Laterality: Bilateral;  . BREAST LUMPECTOMY Right 01/27/2016  . BREAST LUMPECTOMY WITH RADIOACTIVE SEED AND SENTINEL LYMPH NODE BIOPSY Right 01/27/2016   Procedure: BREAST LUMPECTOMY WITH RADIOACTIVE SEED AND SENTINEL LYMPH NODE BIOPSY;  Surgeon: Excell Seltzer, MD;  Location: [place];  Service: General;  Laterality: Right;  . BREAST RECONSTRUCTION WITH PLACEMENT OF TISSUE EXPANDER AND FLEX HD (ACELLULAR HYDRATED DERMIS) Bilateral 03/16/2017   Procedure: BILATERAL IMMEDIATE BREAST RECONSTRUCTION WITH PLACEMENT OF TISSUE EXPANDER AND FLEX HD (ACELLULAR HYDRATED DERMIS);  Surgeon: Wallace Going, DO;  Location: [place];  Service: Plastics;  Laterality: Bilateral;  . BREAST SURGERY  1979   cyst removed rt breast  . CATARACT EXTRACTION W/ INTRAOCULAR LENS IMPLANT     RT EYE  . CESAREAN SECTION     x2  . EXCISION OF BREAST LESION Left 01/30/2018   Procedure: EXCISION OF LEFT BREAST SCAR;  Surgeon: Wallace Going, DO;  Location: [place];  Service: Plastics;  Laterality: Left;  . LATISSIMUS FLAP TO BREAST Right 01/30/2018   Procedure: RIGHT LATISSIMUS MYOCUTANEOUS FLAP FOR RIGHT CHEST WALL RECONSTRUCTION;  Surgeon: Wallace Going, DO;  Location: [place];  Service: Plastics;  Laterality: Right;  . MASTECTOMY W/ SENTINEL NODE BIOPSY Bilateral 03/16/2017   Procedure: BILATERAL TOTAL MASTECTOMY WITH LEFT SENTINEL LYMPH NODE BIOPSY;  Surgeon: Excell Seltzer, MD;  Location: [place];  Service: General;  Laterality: Bilateral;  . REMOVAL OF BILATERAL TISSUE EXPANDERS WITH PLACEMENT OF BILATERAL BREAST IMPLANTS Bilateral 06/15/2017   Procedure: REMOVAL OF BILATERAL TISSUE EXPANDERS WITH PLACEMENT OF  BILATERAL BREAST IMPLANTS;  Surgeon: Wallace Going, DO;  Location: [place];  Service: Plastics;  Laterality: Bilateral;  . TOTAL MASTECTOMY Bilateral 03/16/2017   SENTINAL NODE BIOPSY    FAMILY HISTORY Family History  Problem Relation Age of Onset  . Hearing loss Mother   . Asthma Father   . Cancer Father        melanoma, metastases   . Diabetes Father   . Heart disease Father   . Early death Sister   . Early death Paternal Grandfather   . Cancer Maternal Grandmother 88       colon cancer   . Breast cancer Neg Hx   . Ovarian cancer Neg Hx   The patient's father died at age 48. He had been diagnosed with melanoma in 1 year. That was not the cause of death. The patient's mother is living at age 73 as of May 2018. The patient has one brother, no sisters. There is no history of breast or ovarian cancer in the family, and no other melanoma history.   GYNECOLOGIC HISTORY:  No LMP  recorded. Patient has had a hysterectomy. 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 She frequently keeps her 2 grandchildren. Her husband Krystal Brooks is a retired Airline pilot. He now owns a business where they prepare the connection between the front living in/RV portion and a horse trailer: They do the hitch combination. Daughter Krystal Brooks lives in [place] where she works as a Statistician. Daughter Krystal Brooks lives in [place] where she is an Forensic scientist for [employer]. The patient has 4 grandchildren. She is a Tourist information centre manager.    ADVANCED DIRECTIVES: The patient's husband is automatically her healthcare power of attorney   HEALTH MAINTENANCE: Social History   Tobacco Use  . Smoking status: Former Smoker    Packs/day: 0.50    Years: 40.00    Pack years: 20.00    Types: Cigarettes   . Smokeless tobacco: Never Used  Vaping Use  . Vaping Use: Never used  Substance Use Topics  . Alcohol use: Yes    Alcohol/week: 0.0 standard drinks    Comment: social  . Drug use: No     Colonoscopy:  PAP:  Bone density:05/24/2016 showed a T score of -0.6 normal   Allergies  Allergen Reactions  . Penicillins Swelling, Rash and Other (See Comments)    Older sister died from reaction to penicillin Has patient had a PCN reaction causing immediate rash, facial/tongue/throat swelling, SOB or lightheadedness with hypotension: Yes Has patient had a PCN reaction causing severe rash involving mucus membranes or skin necrosis: No Has patient had a PCN reaction that required hospitalization: No Has patient had a PCN reaction occurring within the last 10 years: No If all of the above answers are "NO", then may proceed with Cephalosporin use.   . Sulfa Antibiotics Rash  . Levaquin [Levofloxacin] Nausea And Vomiting    Current Outpatient Medications  Medication Sig Dispense Refill  . albuterol (PROVENTIL) (2.5 MG/3ML) 0.083% nebulizer solution TAKE 3 MLS (2.5 MG TOTAL) BY NEBULIZATION EVERY 6 HOURS AS NEEDED WHEEZING OR SHORTNESS OF BREATH. 300 mL 5  . Ascorbic Acid (VITAMIN C) 1000 MG tablet Take 1,000 mg by mouth 2 (two) times daily.     . fluticasone (FLONASE) 50 MCG/ACT nasal spray Place 2 sprays into both nostrils daily. 16 g 6  . losartan (COZAAR) 100 MG tablet TAKE 1 TABLET BY MOUTH EVERY DAY 30 tablet 5  . meclizine (ANTIVERT) 25 MG tablet Take 1 tablet (25 mg total) by mouth 3 (three) times daily as needed for dizziness. (Patient not taking: Reported on 12/10/2019) 30 tablet 0  . montelukast (SINGULAIR) 10 MG tablet TAKE 1 TABLET BY MOUTH EVERY DAY 30 tablet 11  . Multiple Vitamin (MULTIVITAMIN WITH MINERALS) TABS tablet Take 1 tablet by mouth daily.    . predniSONE (DELTASONE) 10 MG tablet Take 4 tabs day one and two Take 3 tabs day three and four Take 2 tabs day five Take 1 tab  day six 17 tablet 0  . SYMBICORT 80-4.5 MCG/ACT inhaler TAKE 2 PUFFS BY MOUTH TWICE A DAY 1 each 3  . tamoxifen (NOLVADEX) 20 MG tablet TAKE 1 TABLET BY MOUTH EVERYDAY AT BEDTIME 90 tablet 0  . triamterene-hydrochlorothiazide (MAXZIDE-25) 37.5-25 MG tablet TAKE 1 TABLET BY MOUTH AT BEDTIME. STOP AMLODIPINE 90 tablet 3  . VENTOLIN HFA  108 (90 Base) MCG/ACT inhaler USE 1-2 PUFFS EVERY 6 HOURS AS NEEDED FOR WHEEZING 18 g 5   No current facility-administered medications for this visit.    OBJECTIVE: White woman in no acute distress  Vitals:   06/05/20 1158  BP: (!) 144/75  Pulse: 87  Resp: 18  Temp: (!) 97.5 F (36.4 C)  SpO2: 99%     Body mass index is 24.02 kg/m.    ECOG FS:1 - Symptomatic but completely ambulatory  Sclerae unicteric, EOMs intact Wearing a mask No cervical or supraclavicular adenopathy Lungs no rales or rhonchi Heart regular rate and rhythm Abd soft, nontender, positive bowel sounds MSK mild kyphosis but no focal spinal tenderness, no upper extremity lymphedema Neuro: nonfocal, well oriented, appropriate affect Breasts: Status post bilateral mastectomies with no evidence of chest wall recurrence   LAB RESULTS:  CMP     Component Value Date/Time   NA 138 06/06/2019 1102   NA 137 07/13/2017 1035   K 3.4 (L) 06/06/2019 1102   K 4.3 07/13/2017 1035   CL 101 06/06/2019 1102   CO2 26 06/06/2019 1102   CO2 27 07/13/2017 1035   GLUCOSE 141 (H) 06/06/2019 1102   GLUCOSE 94 07/13/2017 1035   BUN 21 06/06/2019 1102   BUN 15.4 07/13/2017 1035   CREATININE 1.02 (H) 06/06/2019 1102   CREATININE 1.30 (H) 06/20/2018 0930   CREATININE 0.95 12/15/2017 1541   CREATININE 0.8 07/13/2017 1035   CALCIUM 9.1 06/06/2019 1102   CALCIUM 9.3 07/13/2017 1035   PROT 7.4 06/06/2019 1102   PROT 7.7 07/13/2017 1035   ALBUMIN 3.8 06/06/2019 1102   ALBUMIN 3.5 07/13/2017 1035   AST 23 06/06/2019 1102   AST 25 06/20/2018 0930   AST 15 07/13/2017 1035   ALT 25 06/06/2019 1102     ALT 26 06/20/2018 0930   ALT 14 07/13/2017 1035   ALKPHOS 55 06/06/2019 1102   ALKPHOS 73 07/13/2017 1035   BILITOT 0.3 06/06/2019 1102   BILITOT 0.2 (L) 06/20/2018 0930   BILITOT 0.22 07/13/2017 1035   GFRNONAA 58 (L) 06/06/2019 1102   GFRNONAA 43 (L) 06/20/2018 0930   GFRNONAA 64 12/15/2017 1541   GFRAA >60 06/06/2019 1102   GFRAA 50 (L) 06/20/2018 0930   GFRAA 74 12/15/2017 1541    No results found for: Ronnald Ramp, A1GS, A2GS, BETS, BETA2SER, GAMS, MSPIKE, SPEI  No results found for: Nils Pyle, [facility]  Lab Results  Component Value Date   WBC 7.2 06/05/2020   NEUTROABS 4.8 06/05/2020   HGB 12.1 06/05/2020   HCT 37.0 06/05/2020   MCV 88.9 06/05/2020   PLT 167 06/05/2020      Chemistry      Component Value Date/Time   NA 138 06/06/2019 1102   NA 137 07/13/2017 1035   K 3.4 (L) 06/06/2019 1102   K 4.3 07/13/2017 1035   CL 101 06/06/2019 1102   CO2 26 06/06/2019 1102   CO2 27 07/13/2017 1035   BUN 21 06/06/2019 1102   BUN 15.4 07/13/2017 1035   CREATININE 1.02 (H) 06/06/2019 1102   CREATININE 1.30 (H) 06/20/2018 0930   CREATININE 0.95 12/15/2017 1541   CREATININE 0.8 07/13/2017 1035      Component Value Date/Time   CALCIUM 9.1 06/06/2019 1102   CALCIUM 9.3 07/13/2017 1035   ALKPHOS 55 06/06/2019 1102   ALKPHOS 73 07/13/2017 1035   AST 23 06/06/2019 1102   AST 25 06/20/2018 0930   AST 15 07/13/2017 1035  ALT 25 06/06/2019 1102   ALT 26 06/20/2018 0930   ALT 14 07/13/2017 1035   BILITOT 0.3 06/06/2019 1102   BILITOT 0.2 (L) 06/20/2018 0930   BILITOT 0.22 07/13/2017 1035       No results found for: LABCA2  No components found for: [id number]  No results for input(s): INR in the last 168 hours.  Urinalysis No results found for: COLORURINE, APPEARANCEUR, LABSPEC, PHURINE, GLUCOSEU, HGBUR, BILIRUBINUR, KETONESUR, PROTEINUR, UROBILINOGEN, NITRITE, LEUKOCYTESUR   STUDIES: No results found.   ELIGIBLE FOR  AVAILABLE RESEARCH PROTOCOL: no  ASSESSMENT: 64 y.o. [place] woman, with bilateral breast cancer  (1) status post right lumpectomy and sentinel lymph node sampling 01/27/2016 for lobular carcinoma in situ, pleomorphic variant, with multiple foci of invasive lobular carcinoma, all less than a millimeter, but with negative margins and both sentinel lymph nodes negative, invasive disease being estrogen receptor positive, HER-2 not tested (pT1 [mic] pN0}  (2) adjuvant radiation 03/09/2016 through 04/19/2016 The patient initially received a dose of 50Gy in 53factions to the breast using whole-breast tangent fields. This was delivered using a 3-D conformal technique. The patient then received a boost to the seroma. This delivered an additional 10Gy in 524fctions using an en face electron field due to the depth of the seroma. The total dose was 60Gy.  (3) anastrozole started June 2017, discontinued July 2018  (4) status post left breast lower inner quadrant biopsy 12/29/2016 for a clinical T1c N0  invasive lobular breast cancer, grade 2, estrogen receptor positive, progesterone receptor negative HER-2 nonamplified, with a signals ratio 1.35 and the number per cell 2.70.  (5) intermediate Oncotype score of 22 predicts a 10 year risk of recurrence outside the breast of 14% if the patient's only systemic therapy is tamoxifen for 5 years.  (a) the patient opted against chemotherapy (TLovena Lex results confirm choice)  (6) status post bilateral mastectomies and left sentinel lymph node sampling 03/16/2017 showing  (a) on the right, no evidence of carcinoma  (b) on the left, a pT2 pN0, stage IIA invasive lobular carcinoma, grade 2, with negative margins.  (7) had immediate expander placement at the time of bilateral mastectomies  (a) definitive implant placement with bilateral capsulotomies June 15, 2017, with benign pathology  (b) implants removed February 2019 because of chest  discomfort  (c) status post right latissimus flap reconstruction and left capsulotomy 01/30/2018 with benign pathology  (d) decided to have all implants removed with no further reconstruction.  (Per Dr. BiDalbert Mayotteote 03/06/2019).  (8) started tamoxifen 04/30/2017   PLAN: MaKynleys now a little over 3 years out from definitive surgery for her breast cancer with no evidence of disease recurrence.  This is very favorable.  She is tolerating tamoxifen well and the plan is to continue that a minimum of 5 years.  We discussed Covid issues and we are setting her up for her booster shot next available here.  I have encouraged her to continue and if possible extend her exercise program  Total encounter time was 25 minutes.*  Khrystian Schauf, GuVirgie DadMD  06/05/20 12:07 PM Medical On[facility] H[facility]4[place] [id number]. [phone]  Fax. [phone]  I, KaWilburn Mylarm acting as scribe for Dr. GuVirgie DadMagrinat.  I, GuLurline DelD, have reviewed the above documentation for accuracy and completeness, and I agree with the above.   *Total Encounter Time as defined by the Centers for Medicare and  Medicaid Services includes, in addition to the face-to-face time of a patient visit (documented in the note above) non-face-to-face time: obtaining and reviewing outside history, ordering and reviewing medications, tests or procedures, care coordination (communications with other health care professionals or caregivers) and documentation in the medical record.

## 2020-06-05 ENCOUNTER — Other Ambulatory Visit: Payer: Self-pay

## 2020-06-05 ENCOUNTER — Inpatient Hospital Stay: Payer: Medicare HMO | Attending: Oncology | Admitting: Oncology

## 2020-06-05 ENCOUNTER — Inpatient Hospital Stay: Payer: Medicare HMO

## 2020-06-05 VITALS — BP 144/75 | HR 87 | Temp 97.5°F | Resp 18 | Ht 61.0 in | Wt 127.1 lb

## 2020-06-05 DIAGNOSIS — Z87891 Personal history of nicotine dependence: Secondary | ICD-10-CM | POA: Diagnosis not present

## 2020-06-05 DIAGNOSIS — I10 Essential (primary) hypertension: Secondary | ICD-10-CM | POA: Insufficient documentation

## 2020-06-05 DIAGNOSIS — C50311 Malignant neoplasm of lower-inner quadrant of right female breast: Secondary | ICD-10-CM | POA: Insufficient documentation

## 2020-06-05 DIAGNOSIS — Z90710 Acquired absence of both cervix and uterus: Secondary | ICD-10-CM | POA: Insufficient documentation

## 2020-06-05 DIAGNOSIS — Z923 Personal history of irradiation: Secondary | ICD-10-CM | POA: Diagnosis not present

## 2020-06-05 DIAGNOSIS — Z79810 Long term (current) use of selective estrogen receptor modulators (SERMs): Secondary | ICD-10-CM | POA: Insufficient documentation

## 2020-06-05 DIAGNOSIS — Z9013 Acquired absence of bilateral breasts and nipples: Secondary | ICD-10-CM | POA: Diagnosis not present

## 2020-06-05 DIAGNOSIS — Z170 Estrogen receptor positive status [ER+]: Secondary | ICD-10-CM | POA: Diagnosis not present

## 2020-06-05 DIAGNOSIS — Z79899 Other long term (current) drug therapy: Secondary | ICD-10-CM | POA: Insufficient documentation

## 2020-06-05 DIAGNOSIS — C50212 Malignant neoplasm of upper-inner quadrant of left female breast: Secondary | ICD-10-CM | POA: Diagnosis not present

## 2020-06-05 DIAGNOSIS — Z23 Encounter for immunization: Secondary | ICD-10-CM | POA: Insufficient documentation

## 2020-06-05 LAB — CBC WITH DIFFERENTIAL/PLATELET
Abs Immature Granulocytes: 0.01 10*3/uL (ref 0.00–0.07)
Basophils Absolute: 0 10*3/uL (ref 0.0–0.1)
Basophils Relative: 0 %
Eosinophils Absolute: 0.1 10*3/uL (ref 0.0–0.5)
Eosinophils Relative: 1 %
HCT: 37 % (ref 36.0–46.0)
Hemoglobin: 12.1 g/dL (ref 12.0–15.0)
Immature Granulocytes: 0 %
Lymphocytes Relative: 24 %
Lymphs Abs: 1.7 10*3/uL (ref 0.7–4.0)
MCH: 29.1 pg (ref 26.0–34.0)
MCHC: 32.7 g/dL (ref 30.0–36.0)
MCV: 88.9 fL (ref 80.0–100.0)
Monocytes Absolute: 0.6 10*3/uL (ref 0.1–1.0)
Monocytes Relative: 8 %
Neutro Abs: 4.8 10*3/uL (ref 1.7–7.7)
Neutrophils Relative %: 67 %
Platelets: 167 10*3/uL (ref 150–400)
RBC: 4.16 MIL/uL (ref 3.87–5.11)
RDW: 13.1 % (ref 11.5–15.5)
WBC: 7.2 10*3/uL (ref 4.0–10.5)
nRBC: 0 % (ref 0.0–0.2)

## 2020-06-05 LAB — COMPREHENSIVE METABOLIC PANEL
ALT: 24 U/L (ref 0–44)
AST: 22 U/L (ref 15–41)
Albumin: 3.6 g/dL (ref 3.5–5.0)
Alkaline Phosphatase: 51 U/L (ref 38–126)
Anion gap: 6 (ref 5–15)
BUN: 18 mg/dL (ref 8–23)
CO2: 30 mmol/L (ref 22–32)
Calcium: 9.3 mg/dL (ref 8.9–10.3)
Chloride: 101 mmol/L (ref 98–111)
Creatinine, Ser: 1.01 mg/dL — ABNORMAL HIGH (ref 0.44–1.00)
GFR calc non Af Amer: 58 mL/min — ABNORMAL LOW (ref >60)
Glucose, Bld: 119 mg/dL — ABNORMAL HIGH (ref 70–99)
Potassium: 3.3 mmol/L — ABNORMAL LOW (ref 3.5–5.1)
Sodium: 137 mmol/L (ref 135–145)
Total Bilirubin: 0.3 mg/dL (ref 0.3–1.2)
Total Protein: 7.5 g/dL (ref 6.5–8.1)

## 2020-06-06 ENCOUNTER — Telehealth: Payer: Self-pay

## 2020-06-06 NOTE — Telephone Encounter (Signed)
-----   Message from Gardenia Phlegm, NP sent at 06/06/2020 12:19 PM EDT ----- Potassium slightly low, would recommend increase of potassium rich foods ----- Message ----- From: Buel Ream, Lab In [place] Sent: 06/05/2020  11:51 AM EDT To: Chauncey Cruel, MD

## 2020-06-06 NOTE — Telephone Encounter (Signed)
Called pt and gave below results. Pt understands results and states she will increase oral intake of K rich foods. This nurse educated pt on which foods were K rich.

## 2020-06-09 ENCOUNTER — Telehealth: Payer: Self-pay | Admitting: Oncology

## 2020-06-09 NOTE — Telephone Encounter (Signed)
Scheduled per 10/7 los. Called and left a msg  

## 2020-06-10 ENCOUNTER — Inpatient Hospital Stay: Payer: Medicare HMO

## 2020-06-10 ENCOUNTER — Other Ambulatory Visit: Payer: Self-pay

## 2020-06-10 DIAGNOSIS — Z23 Encounter for immunization: Secondary | ICD-10-CM

## 2020-06-10 DIAGNOSIS — C50311 Malignant neoplasm of lower-inner quadrant of right female breast: Secondary | ICD-10-CM | POA: Diagnosis present

## 2020-06-25 ENCOUNTER — Other Ambulatory Visit: Payer: Self-pay | Admitting: Oncology

## 2020-07-14 ENCOUNTER — Other Ambulatory Visit: Payer: Self-pay

## 2020-07-14 ENCOUNTER — Ambulatory Visit (INDEPENDENT_AMBULATORY_CARE_PROVIDER_SITE_OTHER): Payer: Medicare HMO | Admitting: Family Medicine

## 2020-07-14 VITALS — BP 140/90 | HR 79 | Temp 98.1°F | Ht 61.0 in | Wt 126.0 lb

## 2020-07-14 DIAGNOSIS — C50212 Malignant neoplasm of upper-inner quadrant of left female breast: Secondary | ICD-10-CM | POA: Diagnosis not present

## 2020-07-14 DIAGNOSIS — Z0000 Encounter for general adult medical examination without abnormal findings: Secondary | ICD-10-CM

## 2020-07-14 DIAGNOSIS — Z170 Estrogen receptor positive status [ER+]: Secondary | ICD-10-CM | POA: Diagnosis not present

## 2020-07-14 DIAGNOSIS — Z853 Personal history of malignant neoplasm of breast: Secondary | ICD-10-CM

## 2020-07-14 DIAGNOSIS — I700 Atherosclerosis of aorta: Secondary | ICD-10-CM

## 2020-07-14 DIAGNOSIS — Z9889 Other specified postprocedural states: Secondary | ICD-10-CM | POA: Diagnosis not present

## 2020-07-14 DIAGNOSIS — I10 Essential (primary) hypertension: Secondary | ICD-10-CM

## 2020-07-14 DIAGNOSIS — Z0001 Encounter for general adult medical examination with abnormal findings: Secondary | ICD-10-CM | POA: Diagnosis not present

## 2020-07-14 NOTE — Progress Notes (Signed)
Subjective:    Patient ID: Krystal Brooks, female    DOB: Sep 05, 1955, 64 y.o.   MRN: [id number]  HPI  Patient is a very pleasant 64 year old Caucasian female here today for complete physical exam.  Past medical history is significant for breast cancer status post bilateral radical mastectomy and reconstruction.  She was not a candidate for implants due to radiation therapy.  However due to the radical mastectomy, she does not require mammogram.  She does have an annual breast exam performed by her oncologist of the chest wall.  She also has had a hysterectomy and therefore she does not require a Pap smear.  She has never had a colonoscopy.  She refuses colonoscopies.  We discussed this in detail today and she would be willing to consent to Cologuard.  She denies any falls, depression, or or behavioral health.  She does have a history of smoking but has since quit.  Blood pressure today is borderline at 140/90.  She declines HIV and hepatitis C screening.  She has never had any vaccinations since early childhood.  Therefore she has refused all vaccinations up into this point.  However this year, she relented and received all 3 doses of the Covid vaccine.  I told her I was proud of her and I congratulated her on getting the shot.  Also recommended a flu shot, Pneumovax 23, and a shingles vaccine.  The patient politely declined the mild today but will consider them and let me know if she changes her mind.  Her last bone density test was 2017.  It is not due again until 2022. Past Medical History:  Diagnosis Date  . Adie's pupil    LEFT EYE  . Allergy   . Asthma   . Atherosclerosis of aorta (HCC)    bulge above renal arteries on mri 2016  . Cancer [facility]) 2017   right breast ca  . Cellulitis    chest  . Complication of anesthesia    VERY HARD TO WAKE UP    . Hypertension   . Personal history of radiation therapy   . PONV (postoperative nausea and vomiting)   . Smoker    Past Surgical History:    Procedure Laterality Date  . ABDOMINAL HYSTERECTOMY    . BREAST BIOPSY Right 01/02/2016  . BREAST EXCISIONAL BIOPSY Right 1979  . BREAST IMPLANT REMOVAL Bilateral 10/05/2017   Procedure: [id number] BILATERAL BREAST IMPLANTS;  Surgeon: Wallace Going, DO;  Location: [place];  Service: Plastics;  Laterality: Bilateral;  . BREAST LUMPECTOMY Right 01/27/2016  . BREAST LUMPECTOMY WITH RADIOACTIVE SEED AND SENTINEL LYMPH NODE BIOPSY Right 01/27/2016   Procedure: BREAST LUMPECTOMY WITH RADIOACTIVE SEED AND SENTINEL LYMPH NODE BIOPSY;  Surgeon: Excell Seltzer, MD;  Location: [place];  Service: General;  Laterality: Right;  . BREAST RECONSTRUCTION WITH PLACEMENT OF TISSUE EXPANDER AND FLEX HD (ACELLULAR HYDRATED DERMIS) Bilateral 03/16/2017   Procedure: BILATERAL IMMEDIATE BREAST RECONSTRUCTION WITH PLACEMENT OF TISSUE EXPANDER AND FLEX HD (ACELLULAR HYDRATED DERMIS);  Surgeon: Wallace Going, DO;  Location: [place];  Service: Plastics;  Laterality: Bilateral;  . BREAST SURGERY  1979   cyst removed rt breast  . CATARACT EXTRACTION W/ INTRAOCULAR LENS IMPLANT     RT EYE  . CESAREAN SECTION     x2  . EXCISION OF BREAST LESION Left 01/30/2018   Procedure: EXCISION OF LEFT BREAST SCAR;  Surgeon: Wallace Going, DO;  Location: [place];  Service: Plastics;  Laterality: Left;  . LATISSIMUS FLAP TO BREAST Right 01/30/2018   Procedure: RIGHT LATISSIMUS MYOCUTANEOUS FLAP FOR RIGHT CHEST WALL RECONSTRUCTION;  Surgeon: Wallace Going, DO;  Location: [place];  Service: Plastics;  Laterality: Right;  . MASTECTOMY W/ SENTINEL NODE BIOPSY Bilateral 03/16/2017   Procedure: BILATERAL TOTAL MASTECTOMY WITH LEFT SENTINEL LYMPH NODE BIOPSY;  Surgeon: Excell Seltzer, MD;  Location: [place];  Service: General;  Laterality: Bilateral;  . REMOVAL OF BILATERAL TISSUE EXPANDERS WITH PLACEMENT OF BILATERAL BREAST IMPLANTS Bilateral 06/15/2017   Procedure: REMOVAL OF BILATERAL  TISSUE EXPANDERS WITH PLACEMENT OF BILATERAL BREAST IMPLANTS;  Surgeon: Wallace Going, DO;  Location: [place];  Service: Plastics;  Laterality: Bilateral;  . TOTAL MASTECTOMY Bilateral 03/16/2017   SENTINAL NODE BIOPSY   Current Outpatient Medications on File Prior to Visit  Medication Sig Dispense Refill  . albuterol (PROVENTIL) (2.5 MG/3ML) 0.083% nebulizer solution TAKE 3 MLS (2.5 MG TOTAL) BY NEBULIZATION EVERY 6 HOURS AS NEEDED WHEEZING OR SHORTNESS OF BREATH. 300 mL 5  . Ascorbic Acid (VITAMIN C) 1000 MG tablet Take 1,000 mg by mouth 2 (two) times daily.     Marland Kitchen losartan (COZAAR) 100 MG tablet TAKE 1 TABLET BY MOUTH EVERY DAY 30 tablet 5  . montelukast (SINGULAIR) 10 MG tablet TAKE 1 TABLET BY MOUTH EVERY DAY 30 tablet 11  . Multiple Vitamin (MULTIVITAMIN WITH MINERALS) TABS tablet Take 1 tablet by mouth daily.    . SYMBICORT 80-4.5 MCG/ACT inhaler TAKE 2 PUFFS BY MOUTH TWICE A DAY 1 each 3  . tamoxifen (NOLVADEX) 20 MG tablet TAKE 1 TABLET AT BEDTIME 30 tablet 14  . triamterene-hydrochlorothiazide (MAXZIDE-25) 37.5-25 MG tablet TAKE 1 TABLET BY MOUTH AT BEDTIME. STOP AMLODIPINE 90 tablet 3  . VENTOLIN HFA 108 (90 Base) MCG/ACT inhaler USE 1-2 PUFFS EVERY 6 HOURS AS NEEDED FOR WHEEZING 18 g 5  . fluticasone (FLONASE) 50 MCG/ACT nasal spray Place 2 sprays into both nostrils daily. (Patient not taking: Reported on 07/14/2020) 16 g 6  . meclizine (ANTIVERT) 25 MG tablet Take 1 tablet (25 mg total) by mouth 3 (three) times daily as needed for dizziness. (Patient not taking: Reported on 12/10/2019) 30 tablet 0   No current facility-administered medications on file prior to visit.   Allergies  Allergen Reactions  . Penicillins Swelling, Rash and Other (See Comments)    Older sister died from reaction to penicillin Has patient had a PCN reaction causing immediate rash, facial/tongue/throat swelling, SOB or lightheadedness with hypotension: Yes Has patient had a PCN  reaction causing severe rash involving mucus membranes or skin necrosis: No Has patient had a PCN reaction that required hospitalization: No Has patient had a PCN reaction occurring within the last 10 years: No If all of the above answers are "NO", then may proceed with Cephalosporin use.   . Sulfa Antibiotics Rash  . Levaquin [Levofloxacin] Nausea And Vomiting   Social History   Socioeconomic History  . Marital status: Married    Spouse name: Not on file  . Number of children: Not on file  . Years of education: Not on file  . Highest education level: Not on file  Occupational History  . Not on file  Tobacco Use  . Smoking status: Former Smoker    Packs/day: 0.50    Years: 40.00    Pack years: 20.00    Types: Cigarettes  . Smokeless tobacco: Never Used  Vaping Use  . Vaping Use: Never used  Substance and  Sexual Activity  . Alcohol use: Yes    Alcohol/week: 0.0 standard drinks    Comment: social  . Drug use: No  . Sexual activity: Yes    Birth control/protection: Surgical    Comment: hysterectomy  Other Topics Concern  . Not on file  Social History Narrative  . Not on file   Social Determinants of Health   Financial Resource Strain:   . Difficulty of Paying Living Expenses: Not on file  Food Insecurity:   . Worried About Charity fundraiser in the Last Year: Not on file  . Ran Out of Food in the Last Year: Not on file  Transportation Needs:   . Lack of Transportation (Medical): Not on file  . Lack of Transportation (Non-Medical): Not on file  Physical Activity:   . Days of Exercise per Week: Not on file  . Minutes of Exercise per Session: Not on file  Stress:   . Feeling of Stress : Not on file  Social Connections:   . Frequency of Communication with Friends and Family: Not on file  . Frequency of Social Gatherings with Friends and Family: Not on file  . Attends Religious Services: Not on file  . Active Member of Clubs or Organizations: Not on file  .  Attends Archivist Meetings: Not on file  . Marital Status: Not on file  Intimate Partner Violence:   . Fear of Current or Ex-Partner: Not on file  . Emotionally Abused: Not on file  . Physically Abused: Not on file  . Sexually Abused: Not on file     Review of Systems  All other systems reviewed and are negative.      Objective:   Physical Exam Vitals reviewed.  Constitutional:      General: She is not in acute distress.    Appearance: Normal appearance. She is normal weight. She is not ill-appearing, toxic-appearing or diaphoretic.  HENT:     Head: Normocephalic and atraumatic.     Right Ear: Tympanic membrane and ear canal normal. There is no impacted cerumen.     Left Ear: Tympanic membrane and ear canal normal. There is no impacted cerumen.     Nose: Nose normal. No congestion or rhinorrhea.     Mouth/Throat:     Mouth: Mucous membranes are moist.     Pharynx: No oropharyngeal exudate or posterior oropharyngeal erythema.  Eyes:     Extraocular Movements: Extraocular movements intact.     Conjunctiva/sclera: Conjunctivae normal.  Neck:     Vascular: No carotid bruit.  Cardiovascular:     Rate and Rhythm: Normal rate and regular rhythm.     Heart sounds: Normal heart sounds. No murmur heard.  No friction rub. No gallop.   Pulmonary:     Effort: Pulmonary effort is normal. No respiratory distress.     Breath sounds: Normal breath sounds. No stridor. No wheezing, rhonchi or rales.  Chest:     Breasts:        Right: Absent.        Left: Absent.  Abdominal:     General: Bowel sounds are normal. There is no distension.     Palpations: Abdomen is soft. There is no mass.     Tenderness: There is no abdominal tenderness. There is no guarding or rebound.     Hernia: No hernia is present.  Musculoskeletal:     Cervical back: Normal range of motion and neck supple.     Right lower  leg: No edema.     Left lower leg: No edema.  Lymphadenopathy:     Cervical: No  cervical adenopathy.  Skin:    General: Skin is warm.     Coloration: Skin is not jaundiced or pale.     Findings: No bruising, erythema, lesion or rash.  Neurological:     General: No focal deficit present.     Mental Status: She is alert and oriented to person, place, and time.     Cranial Nerves: No cranial nerve deficit.     Sensory: No sensory deficit.     Motor: No weakness.     Coordination: Coordination normal.     Gait: Gait normal.     Deep Tendon Reflexes: Reflexes normal.  Psychiatric:        Mood and Affect: Mood normal.        Behavior: Behavior normal.        Thought Content: Thought content normal.        Judgment: Judgment normal.           Assessment & Plan:  History of breast cancer in female  Status post bilateral breast reconstruction  Atherosclerosis of aorta (HCC)  Benign essential HTN  General medical exam  Malignant neoplasm of upper-inner quadrant of left breast in female, estrogen receptor positive ([place])  Patient is still on therapy with tamoxifen.  She is due for repeat bone density next year.  I recommended calcium 1200 mg a day and vitamin D 1000 units a day.  Blood pressure today is borderline.  She did recently have hypokalemia on lab work obtained at her oncologist.  I reviewed the CMP which also showed an elevated nonfasting blood sugar of 119 and a potassium of 3.3.  Her CBC was normal.  She is due to repeat a CMP to monitor her potassium and also recheck her cholesterol.  If her potassium remains low, she would like to switch off of Maxide and to replace with atenolol 50 mg a day.  She is tried amlodipine in the past but had to stop that due to leg swelling.  Given her atherosclerosis of the aorta, I would like to try to keep her LDL cholesterol less than 100.  I recommended a colonoscopy but I was able to convince the patient to at least do Cologuard.  She denies any falls, depression, or memory loss.  She refuses a flu shot, the shingles  vaccine, but will consider the pneumonia vaccine/Pneumovax 23.

## 2020-09-02 ENCOUNTER — Telehealth: Payer: Self-pay | Admitting: *Deleted

## 2020-09-02 NOTE — Telephone Encounter (Signed)
Received call from patient.   Reports that she was exposed to COVID on 08/29/2020. Inquired as to how to proceed.   Advised that CDC now states that she can be tested on day 5 from exposure, but as long as there are no symptoms, she does not need to isolate.

## 2020-09-03 ENCOUNTER — Other Ambulatory Visit: Payer: Self-pay | Admitting: Family Medicine

## 2020-09-05 ENCOUNTER — Encounter: Payer: Self-pay | Admitting: *Deleted

## 2020-09-05 NOTE — Telephone Encounter (Signed)
This encounter was created in error - please disregard.

## 2020-09-05 NOTE — Telephone Encounter (Signed)
Received call from patient.   Reports that she and spouse began experiencing mild Sx on 09/04/2020. Rapid test noted + for both.   Patient and spouse have had vaccine and booster.   Reports that Sx remain mild, but sore throat, low grade fever (T max 99.9), sinus pressure, HA, cough, and fatigue noted.   Advised to continue Sx management with OTC medications. Advised if chest pain, SOB, high fever unresponsive to antipyretics noted, go to ER for evaluation.   Reports that if Sx worsen she will contact office to notify PCP.

## 2020-09-09 ENCOUNTER — Other Ambulatory Visit: Payer: Self-pay | Admitting: Family Medicine

## 2020-09-09 DIAGNOSIS — I10 Essential (primary) hypertension: Secondary | ICD-10-CM

## 2020-09-09 DIAGNOSIS — L308 Other specified dermatitis: Secondary | ICD-10-CM | POA: Diagnosis not present

## 2020-10-07 ENCOUNTER — Other Ambulatory Visit: Payer: Self-pay | Admitting: Physician Assistant

## 2020-10-07 DIAGNOSIS — D485 Neoplasm of uncertain behavior of skin: Secondary | ICD-10-CM | POA: Diagnosis not present

## 2020-10-07 DIAGNOSIS — L308 Other specified dermatitis: Secondary | ICD-10-CM | POA: Diagnosis not present

## 2020-10-07 DIAGNOSIS — L309 Dermatitis, unspecified: Secondary | ICD-10-CM | POA: Diagnosis not present

## 2020-10-17 ENCOUNTER — Other Ambulatory Visit: Payer: Self-pay | Admitting: Family Medicine

## 2020-10-17 DIAGNOSIS — J45901 Unspecified asthma with (acute) exacerbation: Secondary | ICD-10-CM

## 2020-11-03 DIAGNOSIS — L905 Scar conditions and fibrosis of skin: Secondary | ICD-10-CM | POA: Diagnosis not present

## 2020-11-03 DIAGNOSIS — D1801 Hemangioma of skin and subcutaneous tissue: Secondary | ICD-10-CM | POA: Diagnosis not present

## 2020-11-03 DIAGNOSIS — D225 Melanocytic nevi of trunk: Secondary | ICD-10-CM | POA: Diagnosis not present

## 2020-11-03 DIAGNOSIS — L814 Other melanin hyperpigmentation: Secondary | ICD-10-CM | POA: Diagnosis not present

## 2020-11-03 DIAGNOSIS — L821 Other seborrheic keratosis: Secondary | ICD-10-CM | POA: Diagnosis not present

## 2020-11-03 DIAGNOSIS — L2084 Intrinsic (allergic) eczema: Secondary | ICD-10-CM | POA: Diagnosis not present

## 2020-11-03 DIAGNOSIS — Z85828 Personal history of other malignant neoplasm of skin: Secondary | ICD-10-CM | POA: Diagnosis not present

## 2020-12-26 ENCOUNTER — Telehealth: Payer: Self-pay | Admitting: Family Medicine

## 2020-12-26 NOTE — Progress Notes (Signed)
  Chronic Care Management   Note  12/26/2020 Name: Krystal Brooks MRN: [id number] DOB: [birth year]  Krystal Brooks is a 65 y.o. year old female who is a primary care patient of Dennard Schaumann, Cammie Mcgee, MD. I reached out to Krystal Brooks by phone today in response to a referral sent by Ms. Trinidad Curet Fujiwara's PCP, Susy Frizzle, MD.   [name] was given information about Chronic Care Management services today including:  1. CCM service includes personalized support from designated clinical staff supervised by her physician, including individualized plan of care and coordination with other care providers 2. 24/7 contact phone numbers for assistance for urgent and routine care needs. 3. Service will only be billed when office clinical staff spend 20 minutes or more in a month to coordinate care. 4. Only one practitioner may furnish and bill the service in a calendar month. 5. The patient may stop CCM services at any time (effective at the end of the month) by phone call to the office staff.   Patient agreed to services and verbal consent obtained.   Follow up plan:   Carley Perdue [employer] Scheduler

## 2020-12-31 NOTE — Progress Notes (Signed)
Subjective:   Krystal Brooks is a 65 y.o. female who presents for an Initial Medicare Annual Wellness Visit.  I connected with Krystal Brooks today by telephone and verified that I am speaking with the correct person using two identifiers. Location patient: home Location provider: work Persons participating in the virtual visit: patient, provider.   I discussed the limitations, risks, security and privacy concerns of performing an evaluation and management service by telephone and the availability of in person appointments. I also discussed with the patient that there may be a patient responsible charge related to this service. The patient expressed understanding and verbally consented to this telephonic visit.    Interactive audio and video telecommunications were attempted between this provider and patient, however failed, due to patient having technical difficulties OR patient did not have access to video capability.  We continued and completed visit with audio only.      Review of Systems    N/A Cardiac Risk Factors include: advanced age (>5men, >15 women);hypertension     Objective:    Today's Vitals   There is no height or weight on file to calculate BMI.  Advanced Directives 01/01/2021 07/14/2020 04/17/2018 01/31/2018 01/25/2018 10/05/2017 09/27/2017  Does Patient Have a Medical Advance Directive? Yes No No No No Yes Yes  Type of Paramedic of [place];Living will - - - - Living will Living will  Does patient want to make changes to medical advance directive? No - Patient declined - - - - No - Patient declined -  Copy of [place] in Chart? No - copy requested - - - - - -  Would patient like information on creating a medical advance directive? - - No - Patient declined No - Patient declined Yes (MAU/Ambulatory/Procedural Areas - Information given) - -    Current Medications (verified) Outpatient Encounter Medications as of 01/01/2021   Medication Sig  . albuterol (PROVENTIL) (2.5 MG/3ML) 0.083% nebulizer solution TAKE 3 MLS (2.5 MG TOTAL) BY NEBULIZATION EVERY 6 HOURS AS NEEDED WHEEZING OR SHORTNESS OF BREATH.  Marland Kitchen Ascorbic Acid (VITAMIN C) 1000 MG tablet Take 1,000 mg by mouth 2 (two) times daily.   Marland Kitchen losartan (COZAAR) 100 MG tablet TAKE 1 TABLET BY MOUTH EVERY DAY  . montelukast (SINGULAIR) 10 MG tablet TAKE 1 TABLET BY MOUTH EVERY DAY  . Multiple Vitamin (MULTIVITAMIN WITH MINERALS) TABS tablet Take 1 tablet by mouth daily.  . SYMBICORT 80-4.5 MCG/ACT inhaler TAKE 2 PUFFS BY MOUTH TWICE A DAY  . tamoxifen (NOLVADEX) 20 MG tablet TAKE 1 TABLET AT BEDTIME  . triamterene-hydrochlorothiazide (MAXZIDE-25) 37.5-25 MG tablet TAKE 1 TABLET BY MOUTH AT BEDTIME. STOP AMLODIPINE  . VENTOLIN HFA 108 (90 Base) MCG/ACT inhaler USE 1-2 PUFFS EVERY 6 HOURS AS NEEDED FOR WHEEZING  . fluticasone (FLONASE) 50 MCG/ACT nasal spray Place 2 sprays into both nostrils daily. (Patient not taking: Reported on 01/01/2021)  . meclizine (ANTIVERT) 25 MG tablet Take 1 tablet (25 mg total) by mouth 3 (three) times daily as needed for dizziness. (Patient not taking: No sig reported)   No facility-administered encounter medications on file as of 01/01/2021.    Allergies (verified) Penicillins, Sulfa antibiotics, and Levaquin [levofloxacin]   History: Past Medical History:  Diagnosis Date  . Adie's pupil    LEFT EYE  . Allergy   . Asthma   . Atherosclerosis of aorta (HCC)    bulge above renal arteries on mri 2016  . Cancer [facility]) 2017  right breast ca  . Cellulitis    chest  . Complication of anesthesia    VERY HARD TO WAKE UP    . Eczema   . Hypertension   . Personal history of radiation therapy   . PONV (postoperative nausea and vomiting)   . Smoker    Past Surgical History:  Procedure Laterality Date  . ABDOMINAL HYSTERECTOMY    . BREAST BIOPSY Right 01/02/2016  . BREAST EXCISIONAL BIOPSY Right 1979  . BREAST IMPLANT REMOVAL Bilateral  10/05/2017   Procedure: [id number] BILATERAL BREAST IMPLANTS;  Surgeon: Wallace Going, DO;  Location: [place];  Service: Plastics;  Laterality: Bilateral;  . BREAST LUMPECTOMY Right 01/27/2016  . BREAST LUMPECTOMY WITH RADIOACTIVE SEED AND SENTINEL LYMPH NODE BIOPSY Right 01/27/2016   Procedure: BREAST LUMPECTOMY WITH RADIOACTIVE SEED AND SENTINEL LYMPH NODE BIOPSY;  Surgeon: Excell Seltzer, MD;  Location: [place];  Service: General;  Laterality: Right;  . BREAST RECONSTRUCTION WITH PLACEMENT OF TISSUE EXPANDER AND FLEX HD (ACELLULAR HYDRATED DERMIS) Bilateral 03/16/2017   Procedure: BILATERAL IMMEDIATE BREAST RECONSTRUCTION WITH PLACEMENT OF TISSUE EXPANDER AND FLEX HD (ACELLULAR HYDRATED DERMIS);  Surgeon: Wallace Going, DO;  Location: [place];  Service: Plastics;  Laterality: Bilateral;  . BREAST SURGERY  1979   cyst removed rt breast  . CATARACT EXTRACTION W/ INTRAOCULAR LENS IMPLANT     RT EYE  . CESAREAN SECTION     x2  . EXCISION OF BREAST LESION Left 01/30/2018   Procedure: EXCISION OF LEFT BREAST SCAR;  Surgeon: Wallace Going, DO;  Location: [place];  Service: Plastics;  Laterality: Left;  . LATISSIMUS FLAP TO BREAST Right 01/30/2018   Procedure: RIGHT LATISSIMUS MYOCUTANEOUS FLAP FOR RIGHT CHEST WALL RECONSTRUCTION;  Surgeon: Wallace Going, DO;  Location: [place];  Service: Plastics;  Laterality: Right;  . MASTECTOMY W/ SENTINEL NODE BIOPSY Bilateral 03/16/2017   Procedure: BILATERAL TOTAL MASTECTOMY WITH LEFT SENTINEL LYMPH NODE BIOPSY;  Surgeon: Excell Seltzer, MD;  Location: [place];  Service: General;  Laterality: Bilateral;  . REMOVAL OF BILATERAL TISSUE EXPANDERS WITH PLACEMENT OF BILATERAL BREAST IMPLANTS Bilateral 06/15/2017   Procedure: REMOVAL OF BILATERAL TISSUE EXPANDERS WITH PLACEMENT OF BILATERAL BREAST IMPLANTS;  Surgeon: Wallace Going, DO;  Location: [place];  Service: Plastics;  Laterality:  Bilateral;  . TOTAL MASTECTOMY Bilateral 03/16/2017   SENTINAL NODE BIOPSY   Family History  Problem Relation Age of Onset  . Hearing loss Mother   . Asthma Father   . Cancer Father        melanoma, metastases   . Diabetes Father   . Heart disease Father   . Early death Sister   . Early death Paternal Grandfather   . Cancer Maternal Grandmother 88       colon cancer   . Breast cancer Neg Hx   . Ovarian cancer Neg Hx    Social History   Socioeconomic History  . Marital status: Married    Spouse name: Not on file  . Number of children: Not on file  . Years of education: Not on file  . Highest education level: Not on file  Occupational History  . Not on file  Tobacco Use  . Smoking status: Former Smoker    Packs/day: 0.50    Years: 40.00    Pack years: 20.00    Types: Cigarettes  . Smokeless tobacco: Never Used  Vaping Use  . Vaping Use: Never used  Substance and Sexual Activity  . Alcohol use: Yes    Alcohol/week: 0.0 standard drinks    Comment: social  . Drug use: No  . Sexual activity: Yes    Birth control/protection: Surgical    Comment: hysterectomy  Other Topics Concern  . Not on file  Social History Narrative  . Not on file   Social Determinants of Health   Financial Resource Strain: Low Risk   . Difficulty of Paying Living Expenses: Not hard at all  Food Insecurity: No Food Insecurity  . Worried About Charity fundraiser in the Last Year: Never true  . Ran Out of Food in the Last Year: Never true  Transportation Needs: No Transportation Needs  . Lack of Transportation (Medical): No  . Lack of Transportation (Non-Medical): No  Physical Activity: Inactive  . Days of Exercise per Week: 0 days  . Minutes of Exercise per Session: 0 min  Stress: No Stress Concern Present  . Feeling of Stress : Not at all  Social Connections: Moderately Integrated  . Frequency of Communication with Friends and Family: More than three times a week  . Frequency of  Social Gatherings with Friends and Family: More than three times a week  . Attends Religious Services: More than 4 times per year  . Active Member of Clubs or Organizations: No  . Attends Archivist Meetings: Never  . Marital Status: Married    Tobacco Counseling Counseling given: Not Answered   Clinical Intake:  Pre-visit preparation completed: Yes  Pain : No/denies pain     Nutritional Risks: None Diabetes: No  How often do you need to have someone help you when you read instructions, pamphlets, or other written materials from your doctor or pharmacy?: 1 - Never  Diabetic? No   Interpreter Needed?: No  Information entered by :: [place] of Daily Living In your present state of health, do you have any difficulty performing the following activities: 01/01/2021 07/14/2020  Hearing? N N  Vision? N N  Difficulty concentrating or making decisions? N N  Walking or climbing stairs? N N  Dressing or bathing? N N  Doing errands, shopping? N N  Preparing Food and eating ? N N  Using the Toilet? N N  In the past six months, have you accidently leaked urine? N N  Do you have problems with loss of bowel control? N N  Managing your Medications? N N  Managing your Finances? N N  Housekeeping or managing your Housekeeping? N N  Some recent data might be hidden    Patient Care Team: Susy Frizzle, MD as PCP - General (Family Medicine) Excell Seltzer, MD (Inactive) as Consulting Physician (General Surgery) Druscilla Brownie, MD as Consulting Physician (Dermatology) Dillingham, [name], DO as Attending Physician (Plastic Surgery) Magrinat, Virgie Dad, MD as Consulting Physician (Oncology) Delice Bison, Charlestine Massed, NP as Nurse Practitioner (Hematology and Oncology) Edythe Clarity, [facility] as Pharmacist (Pharmacist)  Indicate any recent Medical Services you may have received from other than [facility] providers in the past year (date may be  approximate).     Assessment:   This is a routine wellness examination for [facility].  Hearing/Vision screen  Hearing Screening   125Hz  250Hz  500Hz  1000Hz  2000Hz  3000Hz  4000Hz  6000Hz  8000Hz   Right ear:           Left ear:           Vision Screening Comments: Patient states she gets her eye examined once  per year. Currently wears reading glasses    Dietary issues and exercise activities discussed: Current Exercise Habits: The patient does not participate in regular exercise at present  Goals Addressed            This Visit's Progress   . Exercise 150 min/wk Moderate Activity        Depression Screen PHQ 2/9 Scores 01/01/2021 07/14/2020 04/07/2018 11/24/2017 05/10/2017 06/08/2016 02/11/2016  PHQ - 2 Score 0 0 0 0 0 0 0    Fall Risk Fall Risk  01/01/2021 07/14/2020 04/07/2018 11/24/2017 05/10/2017  Falls in the past year? 0 0 No No No  Number falls in past yr: 0 0 - - -  Injury with Fall? 0 0 - - -  Risk for fall due to : No Fall Risks - - - -  Follow up Falls evaluation completed;Falls prevention discussed - - - -    FALL RISK PREVENTION PERTAINING TO THE HOME:  Any stairs in or around the home? Yes  If so, are there any without handrails? No  Home free of loose throw rugs in walkways, pet beds, electrical cords, etc? Yes  Adequate lighting in your home to reduce risk of falls? Yes   ASSISTIVE DEVICES UTILIZED TO PREVENT FALLS:  Life alert? No  Use of a cane, walker or w/c? No  Grab bars in the bathroom? No  Shower chair or bench in shower? Yes  Elevated toilet seat or a handicapped toilet? No     Cognitive Function: Normal cognitive status assessed by direct observation by this Nurse Health Advisor. No abnormalities found.       6CIT Screen 07/14/2020  What Year? 0 points  What month? 0 points  What time? 0 points  Count back from 20 0 points  Months in reverse 0 points  Repeat phrase 0 points  Total Score 0    Immunizations Immunization History  Administered  Date(s) Administered  . Influenza-Unspecified 04/28/2017  . PFIZER(Purple Top)SARS-COV-2 Vaccination 06/10/2020    TDAP status: Due, Education has been provided regarding the importance of this vaccine. Advised may receive this vaccine at local pharmacy or Health Dept. Aware to provide a copy of the vaccination record if obtained from local pharmacy or Health Dept. Verbalized acceptance and understanding.  Flu Vaccine status: Due, Education has been provided regarding the importance of this vaccine. Advised may receive this vaccine at local pharmacy or Health Dept. Aware to provide a copy of the vaccination record if obtained from local pharmacy or Health Dept. Verbalized acceptance and understanding.  Pneumococcal vaccine status: Due, Education has been provided regarding the importance of this vaccine. Advised may receive this vaccine at local pharmacy or Health Dept. Aware to provide a copy of the vaccination record if obtained from local pharmacy or Health Dept. Verbalized acceptance and understanding.  Covid-19 vaccine status: Completed vaccines  Qualifies for Shingles Vaccine? Yes   Zostavax completed No   Shingrix Completed?: No.    Education has been provided regarding the importance of this vaccine. Patient has been advised to call insurance company to determine out of pocket expense if they have not yet received this vaccine. Advised may also receive vaccine at local pharmacy or Health Dept. Verbalized acceptance and understanding.  Screening Tests Health Maintenance  Topic Date Due  . Hepatitis C Screening  Never done  . PNA vac Low Risk Adult (1 of 2 - PCV13) Never done  . COVID-19 Vaccine (2 - Pfizer risk 4-dose series) 07/01/2020  .  TETANUS/TDAP  12/29/2038 (Originally 12/21/1974)  . INFLUENZA VACCINE  03/30/2021  . COLONOSCOPY (Pts 45-54yrs Insurance coverage will need to be confirmed)  08/30/2022  . DEXA SCAN  Completed  . HPV VACCINES  Aged Out  . MAMMOGRAM  Discontinued     Health Maintenance  Health Maintenance Due  Topic Date Due  . Hepatitis C Screening  Never done  . PNA vac Low Risk Adult (1 of 2 - PCV13) Never done  . COVID-19 Vaccine (2 - Pfizer risk 4-dose series) 07/01/2020    Colorectal cancer screening: Type of screening: Colonoscopy. Completed 08/30/2012. Repeat every 10 years  Mammogram status: Ordered 01/01/2021. Pt provided with contact info and advised to call to schedule appt.   Bone Density status: Completed 05/24/2016. Results reflect: Bone density results: NORMAL. Repeat every 0 years.  Lung Cancer Screening: (Low Dose CT Chest recommended if Age 13-80 years, 30 pack-year currently smoking OR have quit w/in 15years.) does not qualify.   Lung Cancer Screening Referral: N/A  Additional Screening:  Hepatitis C Screening: does qualify;   Vision Screening: Recommended annual ophthalmology exams for early detection of glaucoma and other disorders of the eye. Is the patient up to date with their annual eye exam?  Yes  Who is the provider or what is the name of the office in which the patient attends annual eye exams? Dr. Katy Fitch  If pt is not established with a provider, would they like to be referred to a provider to establish care? No .   Dental Screening: Recommended annual dental exams for proper oral hygiene  Community Resource Referral / Chronic Care Management: CRR required this visit?  No   CCM required this visit?  No      Plan:     I have personally reviewed and noted the following in the patient's chart:   . Medical and social history . Use of alcohol, tobacco or illicit drugs  . Current medications and supplements including opioid prescriptions. Patient is not currently taking opioid prescriptions. . Functional ability and status . Nutritional status . Physical activity . Advanced directives . List of other physicians . Hospitalizations, surgeries, and ER visits in previous 12 months . Vitals . Screenings  to include cognitive, depression, and falls . Referrals and appointments  In addition, I have reviewed and discussed with patient certain preventive protocols, quality metrics, and best practice recommendations. A written personalized care plan for preventive services as well as general preventive health recommendations were provided to patient.     Ofilia Neas, LPN   02/01/7845   Nurse Notes: none

## 2021-01-01 ENCOUNTER — Ambulatory Visit (INDEPENDENT_AMBULATORY_CARE_PROVIDER_SITE_OTHER): Payer: Medicare HMO

## 2021-01-01 ENCOUNTER — Other Ambulatory Visit: Payer: Self-pay

## 2021-01-01 DIAGNOSIS — Z0000 Encounter for general adult medical examination without abnormal findings: Secondary | ICD-10-CM

## 2021-01-01 NOTE — Patient Instructions (Signed)
Krystal Brooks , Thank you for taking time to come for your Medicare Wellness Visit. I appreciate your ongoing commitment to your health goals. Please review the following plan we discussed and let me know if I can assist you in the future.   Screening recommendations/referrals: Colonoscopy: Up to date, next due 08/30/2022 Mammogram: No longer required  Bone Density: No longer required  Recommended yearly ophthalmology/optometry visit for glaucoma screening and checkup Recommended yearly dental visit for hygiene and checkup  Vaccinations: Influenza vaccine: Patient declined  Pneumococcal vaccine: Currently due, you may receive at your next in person office visit  Tdap vaccine: Currently due, you may await and injury to receive  Shingles vaccine: Currently due for Shingrix, if you would like to receive we recommend that you do so at your local pharmacy.     Advanced directives: Please bring copies of your advanced medical directives so that we may scan them into your chart.   Conditions/risks identified: None   Next appointment: 01/07/2022 @ 11:15 am via telephone with [place] 65 Years and Older, Female Preventive care refers to lifestyle choices and visits with your health care provider that can promote health and wellness. What does preventive care include?  A yearly physical exam. This is also called an annual well check.  Dental exams once or twice a year.  Routine eye exams. Ask your health care provider how often you should have your eyes checked.  Personal lifestyle choices, including:  Daily care of your teeth and gums.  Regular physical activity.  Eating a healthy diet.  Avoiding tobacco and drug use.  Limiting alcohol use.  Practicing safe sex.  Taking low-dose aspirin every day.  Taking vitamin and mineral supplements as recommended by your health care provider. What happens during an annual well check? The services and  screenings done by your health care provider during your annual well check will depend on your age, overall health, lifestyle risk factors, and family history of disease. Counseling  Your health care provider may ask you questions about your:  Alcohol use.  Tobacco use.  Drug use.  Emotional well-being.  Home and relationship well-being.  Sexual activity.  Eating habits.  History of falls.  Memory and ability to understand (cognition).  Work and work Statistician.  Reproductive health. Screening  You may have the following tests or measurements:  Height, weight, and BMI.  Blood pressure.  Lipid and cholesterol levels. These may be checked every 5 years, or more frequently if you are over 70 years old.  Skin check.  Lung cancer screening. You may have this screening every year starting at age 61 if you have a 30-pack-year history of smoking and currently smoke or have quit within the past 15 years.  Fecal occult blood test (FOBT) of the stool. You may have this test every year starting at age 44.  Flexible sigmoidoscopy or colonoscopy. You may have a sigmoidoscopy every 5 years or a colonoscopy every 10 years starting at age 69.  Hepatitis C blood test.  Hepatitis B blood test.  Sexually transmitted disease (STD) testing.  Diabetes screening. This is done by checking your blood sugar (glucose) after you have not eaten for a while (fasting). You may have this done every 1-3 years.  Bone density scan. This is done to screen for osteoporosis. You may have this done starting at age 82.  Mammogram. This may be done every 1-2 years. Talk to your health care provider about  how often you should have regular mammograms. Talk with your health care provider about your test results, treatment options, and if necessary, the need for more tests. Vaccines  Your health care provider may recommend certain vaccines, such as:  Influenza vaccine. This is recommended every  year.  Tetanus, diphtheria, and acellular pertussis (Tdap, Td) vaccine. You may need a Td booster every 10 years.  Zoster vaccine. You may need this after age 34.  Pneumococcal 13-valent conjugate (PCV13) vaccine. One dose is recommended after age 106.  Pneumococcal polysaccharide (PPSV23) vaccine. One dose is recommended after age 33. Talk to your health care provider about which screenings and vaccines you need and how often you need them. This information is not intended to replace advice given to you by your health care provider. Make sure you discuss any questions you have with your health care provider. Document Released: 09/12/2015 Document Revised: 05/05/2016 Document Reviewed: 06/17/2015 Elsevier Interactive Patient Education  2017 [employer] Prevention in the Home Falls can cause injuries. They can happen to people of all ages. There are many things you can do to make your home safe and to help prevent falls. What can I do on the outside of my home?  Regularly fix the edges of walkways and driveways and fix any cracks.  Remove anything that might make you trip as you walk through a door, such as a raised step or threshold.  Trim any bushes or trees on the path to your home.  Use bright outdoor lighting.  Clear any walking paths of anything that might make someone trip, such as rocks or tools.  Regularly check to see if handrails are loose or broken. Make sure that both sides of any steps have handrails.  Any raised decks and porches should have guardrails on the edges.  Have any leaves, snow, or ice cleared regularly.  Use sand or salt on walking paths during winter.  Clean up any spills in your garage right away. This includes oil or grease spills. What can I do in the bathroom?  Use night lights.  Install grab bars by the toilet and in the tub and shower. Do not use towel bars as grab bars.  Use non-skid mats or decals in the tub or shower.  If you  need to sit down in the shower, use a plastic, non-slip stool.  Keep the floor dry. Clean up any water that spills on the floor as soon as it happens.  Remove soap buildup in the tub or shower regularly.  Attach bath mats securely with double-sided non-slip rug tape.  Do not have throw rugs and other things on the floor that can make you trip. What can I do in the bedroom?  Use night lights.  Make sure that you have a light by your bed that is easy to reach.  Do not use any sheets or blankets that are too big for your bed. They should not hang down onto the floor.  Have a firm chair that has side arms. You can use this for support while you get dressed.  Do not have throw rugs and other things on the floor that can make you trip. What can I do in the kitchen?  Clean up any spills right away.  Avoid walking on wet floors.  Keep items that you use a lot in easy-to-reach places.  If you need to reach something above you, use a strong step stool that has a grab bar.  Keep  electrical cords out of the way.  Do not use floor polish or wax that makes floors slippery. If you must use wax, use non-skid floor wax.  Do not have throw rugs and other things on the floor that can make you trip. What can I do with my stairs?  Do not leave any items on the stairs.  Make sure that there are handrails on both sides of the stairs and use them. Fix handrails that are broken or loose. Make sure that handrails are as long as the stairways.  Check any carpeting to make sure that it is firmly attached to the stairs. Fix any carpet that is loose or worn.  Avoid having throw rugs at the top or bottom of the stairs. If you do have throw rugs, attach them to the floor with carpet tape.  Make sure that you have a light switch at the top of the stairs and the bottom of the stairs. If you do not have them, ask someone to add them for you. What else can I do to help prevent falls?  Wear shoes  that:  Do not have high heels.  Have rubber bottoms.  Are comfortable and fit you well.  Are closed at the toe. Do not wear sandals.  If you use a stepladder:  Make sure that it is fully opened. Do not climb a closed stepladder.  Make sure that both sides of the stepladder are locked into place.  Ask someone to hold it for you, if possible.  Clearly mark and make sure that you can see:  Any grab bars or handrails.  First and last steps.  Where the edge of each step is.  Use tools that help you move around (mobility aids) if they are needed. These include:  Canes.  Walkers.  Scooters.  Crutches.  Turn on the lights when you go into a dark area. Replace any light bulbs as soon as they burn out.  Set up your furniture so you have a clear path. Avoid moving your furniture around.  If any of your floors are uneven, fix them.  If there are any pets around you, be aware of where they are.  Review your medicines with your doctor. Some medicines can make you feel dizzy. This can increase your chance of falling. Ask your doctor what other things that you can do to help prevent falls. This information is not intended to replace advice given to you by your health care provider. Make sure you discuss any questions you have with your health care provider. Document Released: 06/12/2009 Document Revised: 01/22/2016 Document Reviewed: 09/20/2014 Elsevier Interactive Patient Education  2017 [employer].

## 2021-01-05 ENCOUNTER — Other Ambulatory Visit: Payer: Self-pay | Admitting: Family Medicine

## 2021-01-19 DIAGNOSIS — C44722 Squamous cell carcinoma of skin of right lower limb, including hip: Secondary | ICD-10-CM | POA: Diagnosis not present

## 2021-01-19 DIAGNOSIS — D485 Neoplasm of uncertain behavior of skin: Secondary | ICD-10-CM | POA: Diagnosis not present

## 2021-01-19 DIAGNOSIS — L259 Unspecified contact dermatitis, unspecified cause: Secondary | ICD-10-CM | POA: Diagnosis not present

## 2021-01-19 DIAGNOSIS — L308 Other specified dermatitis: Secondary | ICD-10-CM | POA: Diagnosis not present

## 2021-01-21 DIAGNOSIS — R21 Rash and other nonspecific skin eruption: Secondary | ICD-10-CM | POA: Diagnosis not present

## 2021-01-29 ENCOUNTER — Other Ambulatory Visit: Payer: Self-pay | Admitting: Family Medicine

## 2021-02-09 DIAGNOSIS — C44722 Squamous cell carcinoma of skin of right lower limb, including hip: Secondary | ICD-10-CM | POA: Diagnosis not present

## 2021-02-09 DIAGNOSIS — D485 Neoplasm of uncertain behavior of skin: Secondary | ICD-10-CM | POA: Diagnosis not present

## 2021-02-11 ENCOUNTER — Telehealth: Payer: Self-pay | Admitting: Pharmacist

## 2021-02-11 NOTE — Progress Notes (Addendum)
    Chronic Care Management Pharmacy Assistant   Name: Krystal Brooks  MRN: [id number] DOB: 1956/07/02  [name] is an 65 y.o. year old female who presents for his initial CCM visit with the clinical pharmacist.  Reason for Encounter: Chart Prep   Conditions to be addressed/monitored: HTN.  Primary concerns for visit include:  HTN  Recent office visits:  None in the last six months.  Recent consult visits:  11/03/20 Dermatology Lennie Odor D. For follow-up. No information given. 10/07/20 Dermatology Lennie Odor D. For follow-up. Per note: Skin biopsy. No other information given. 09/09/20 Dermatology Lennie Odor D. For initial visit. No information given.  Hospital visits:  None in previous 6 months  Medications: Outpatient Encounter Medications as of 02/11/2021  Medication Sig   albuterol (PROVENTIL) (2.5 MG/3ML) 0.083% nebulizer solution TAKE 3 MLS (2.5 MG TOTAL) BY NEBULIZATION EVERY 6 HOURS AS NEEDED WHEEZING OR SHORTNESS OF BREATH.   albuterol (VENTOLIN HFA) 108 (90 Base) MCG/ACT inhaler INHALE 1-2 PUFFS EVERY 6 HOURS AS NEEDED FOR WHEEZING   Ascorbic Acid (VITAMIN C) 1000 MG tablet Take 1,000 mg by mouth 2 (two) times daily.    fluticasone (FLONASE) 50 MCG/ACT nasal spray Place 2 sprays into both nostrils daily. (Patient not taking: Reported on 01/01/2021)   losartan (COZAAR) 100 MG tablet TAKE 1 TABLET BY MOUTH EVERY DAY   meclizine (ANTIVERT) 25 MG tablet Take 1 tablet (25 mg total) by mouth 3 (three) times daily as needed for dizziness. (Patient not taking: No sig reported)   montelukast (SINGULAIR) 10 MG tablet TAKE 1 TABLET BY MOUTH EVERY DAY   Multiple Vitamin (MULTIVITAMIN WITH MINERALS) TABS tablet Take 1 tablet by mouth daily.   SYMBICORT 80-4.5 MCG/ACT inhaler TAKE 2 PUFFS BY MOUTH TWICE A DAY   tamoxifen (NOLVADEX) 20 MG tablet TAKE 1 TABLET AT BEDTIME   triamterene-hydrochlorothiazide (MAXZIDE-25) 37.5-25 MG tablet TAKE 1 TABLET BY MOUTH AT BEDTIME. STOP  AMLODIPINE   No facility-administered encounter medications on file as of 02/11/2021.    Have you seen any other providers since your last visit? Patient stated no.  Any changes in your medications or health? Patient stated no.  Any side effects from any medications? Patient stated no.  Do you have an symptoms or problems not managed by your medications? Patient stated no.  Any concerns about your health right now? Patient stated she is worried about a rash that is not going away.   Has your provider asked that you check blood pressure, blood sugar, or follow special diet at home? Patient stated no.  Do you get any type of exercise on a regular basis? Patient stated she walks at least 2 times a week.  Can you think of a goal you would like to reach for your health? Patient stated no.  Do you have any problems getting your medications? Patient stated no.  Is there anything that you would like to discuss during the appointment? Patient stated no.  Please bring medications and supplements to appointment, patient reminded of her OTP appointment on 02/13/21 at 11 am. Prefer that you call [phone].   Follow-Up:Pharmacist Review  Charlann Lange, [place] Pharmacist Assistant [phone]

## 2021-02-12 NOTE — Progress Notes (Signed)
Chronic Care Management Pharmacy Note  02/13/2021 Name:  Krystal Brooks MRN:  [id number] DOB:  [birth year]  Summary: Initial visit with PharmD for CCM med review.  Recommendations/Changes made from today's visit: Patient needs updated lipid panel  Plan: FU 6 months over phone   Subjective: Krystal Brooks is an 65 y.o. year old female who is a primary patient of Pickard, Cammie Mcgee, MD.  The CCM team was consulted for assistance with disease management and care coordination needs.    Engaged with patient by telephone for initial visit in response to provider referral for pharmacy case management and/or care coordination services.   Consent to Services:  The patient was given the following information about Chronic Care Management services today, agreed to services, and gave verbal consent: 1. CCM service includes personalized support from designated clinical staff supervised by the primary care provider, including individualized plan of care and coordination with other care providers 2. 24/7 contact phone numbers for assistance for urgent and routine care needs. 3. Service will only be billed when office clinical staff spend 20 minutes or more in a month to coordinate care. 4. Only one practitioner may furnish and bill the service in a calendar month. 5.The patient may stop CCM services at any time (effective at the end of the month) by phone call to the office staff. 6. The patient will be responsible for cost sharing (co-pay) of up to 20% of the service fee (after annual deductible is met). Patient agreed to services and consent obtained.  Patient Care Team: Susy Frizzle, MD as PCP - General (Family Medicine) Excell Seltzer, MD (Inactive) as Consulting Physician (General Surgery) Druscilla Brownie, MD as Consulting Physician (Dermatology) Dillingham, [name], DO as Attending Physician (Plastic Surgery) Magrinat, Virgie Dad, MD as Consulting Physician (Oncology) Delice Bison Charlestine Massed, NP as Nurse Practitioner (Hematology and Oncology) Edythe Clarity, [facility] as Pharmacist (Pharmacist)  Recent office visits:  None in the last six months.   Recent consult visits:  11/03/20 Dermatology Lennie Odor D. For follow-up. No information given. 10/07/20 Dermatology Lennie Odor D. For follow-up. Per note: Skin biopsy. No other information given. 09/09/20 Dermatology Lennie Odor D. For initial visit. No information given.   Hospital visits:  None in previous 6 months  Objective:  Lab Results  Component Value Date   CREATININE 1.01 (H) 06/05/2020   BUN 18 06/05/2020   GFRNONAA 58 (L) 06/05/2020   GFRAA >60 06/06/2019   NA 137 06/05/2020   K 3.3 (L) 06/05/2020   CALCIUM 9.3 06/05/2020   CO2 30 06/05/2020   GLUCOSE 119 (H) 06/05/2020    No results found for: HGBA1C, FRUCTOSAMINE, GFR, MICROALBUR  Last diabetic Eye exam: No results found for: HMDIABEYEEXA  Last diabetic Foot exam: No results found for: HMDIABFOOTEX   Lab Results  Component Value Date   CHOL 195 02/17/2015   HDL 60 02/17/2015   LDLCALC 119 (H) 02/17/2015   TRIG 81 02/17/2015   CHOLHDL 3.3 02/17/2015    Hepatic Function Latest Ref Rng & Units 06/05/2020 06/06/2019 06/20/2018  Total Protein 6.5 - 8.1 g/dL 7.5 7.4 7.7  Albumin 3.5 - 5.0 g/dL 3.6 3.8 3.6  AST 15 - 41 U/L 22 23 25   ALT 0 - 44 U/L 24 25 26   Alk Phosphatase 38 - 126 U/L 51 55 52  Total Bilirubin 0.3 - 1.2 mg/dL 0.3 0.3 0.2(L)    Lab Results  Component Value Date/Time   TSH 0.68 12/01/2017 02:32 PM  CBC Latest Ref Rng & Units 06/05/2020 06/06/2019 06/20/2018  WBC 4.0 - 10.5 K/uL 7.2 7.1 5.2  Hemoglobin 12.0 - 15.0 g/dL 12.1 12.5 11.6(L)  Hematocrit 36.0 - 46.0 % 37.0 37.5 36.2  Platelets 150 - 400 K/uL 167 170 152    Lab Results  Component Value Date/Time   VD25OH 36.5 06/14/2016 08:28 AM    Clinical ASCVD: No  The ASCVD Risk score Mikey Bussing DC Jr., et al., 2013) failed to calculate for the following reasons:    The systolic blood pressure is missing   Cannot find a previous HDL lab   Cannot find a previous total cholesterol lab    Depression screen [facility] 2/9 01/01/2021 07/14/2020 04/07/2018  Decreased Interest 0 0 0  Down, Depressed, Hopeless 0 0 0  PHQ - 2 Score 0 0 0      Social History   Tobacco Use  Smoking Status Former   Packs/day: 0.50   Years: 40.00   Pack years: 20.00   Types: Cigarettes  Smokeless Tobacco Never   BP Readings from Last 3 Encounters:  07/14/20 140/90  06/05/20 (!) 144/75  12/10/19 118/64   Pulse Readings from Last 3 Encounters:  07/14/20 79  06/05/20 87  12/10/19 (!) 110   Wt Readings from Last 3 Encounters:  07/14/20 126 lb (57.2 kg)  06/05/20 127 lb 1.6 oz (57.7 kg)  12/10/19 125 lb (56.7 kg)   BMI Readings from Last 3 Encounters:  07/14/20 23.81 kg/m  06/05/20 24.02 kg/m  12/10/19 23.62 kg/m    Assessment/Interventions: Review of patient past medical history, allergies, medications, health status, including review of consultants reports, laboratory and other test data, was performed as part of comprehensive evaluation and provision of chronic care management services.   SDOH:  (Social Determinants of Health) assessments and interventions performed: Yes  Financial Resource Strain: Low Risk    Difficulty of Paying Living Expenses: Not hard at all    SDOH Screenings   Alcohol Screen: Low Risk    Last Alcohol Screening Score (AUDIT): 1  Depression (PHQ2-9): Low Risk    PHQ-2 Score: 0  Financial Resource Strain: Low Risk    Difficulty of Paying Living Expenses: Not hard at all  Food Insecurity: No Food Insecurity   Worried About Charity fundraiser in the Last Year: Never true   Ran Out of Food in the Last Year: Never true  Housing: Low Risk    Last Housing Risk Score: 0  Physical Activity: Inactive   Days of Exercise per Week: 0 days   Minutes of Exercise per Session: 0 min  Social Connections: Moderately Integrated   Frequency of  Communication with Friends and Family: More than three times a week   Frequency of Social Gatherings with Friends and Family: More than three times a week   Attends Religious Services: More than 4 times per year   Active Member of [employer] or Organizations: No   Attends Archivist Meetings: Never   Marital Status: Married  Stress: No Stress Concern Present   Feeling of Stress : Not at all  Tobacco Use: Medium Risk   Smoking Tobacco Use: Former   Smokeless Tobacco Use: Never  Transportation Needs: No Data processing manager (Medical): No   Lack of Transportation (Non-Medical): No    CCM Care Plan  Allergies  Allergen Reactions   Penicillins Swelling, Rash and Other (See Comments)    Older sister died from reaction to penicillin Has patient  had a PCN reaction causing immediate rash, facial/tongue/throat swelling, SOB or lightheadedness with hypotension: Yes Has patient had a PCN reaction causing severe rash involving mucus membranes or skin necrosis: No Has patient had a PCN reaction that required hospitalization: No Has patient had a PCN reaction occurring within the last 10 years: No If all of the above answers are "NO", then may proceed with Cephalosporin use.    Sulfa Antibiotics Rash   Levaquin [Levofloxacin] Nausea And Vomiting    Medications Reviewed Today     Reviewed by Edythe Clarity, [facility] (Pharmacist) on 02/13/21 at 1201  Med List Status: <None>   Medication Order Taking? Sig Documenting Provider Last Dose Status Informant  albuterol (PROVENTIL) (2.5 MG/3ML) 0.083% nebulizer solution [id number] Yes TAKE 3 MLS (2.5 MG TOTAL) BY NEBULIZATION EVERY 6 HOURS AS NEEDED WHEEZING OR SHORTNESS OF BREATH. Ishmael Holter A, FNP Taking Active   albuterol (VENTOLIN HFA) 108 (90 Base) MCG/ACT inhaler [id number] Yes INHALE 1-2 PUFFS EVERY 6 HOURS AS NEEDED FOR WHEEZING Pickard, Cammie Mcgee, MD Taking Active   Ascorbic Acid (VITAMIN C) 1000 MG tablet  [id number] Yes Take 1,000 mg by mouth 2 (two) times daily.  [provider] Taking Active Self  fluticasone (FLONASE) 50 MCG/ACT nasal spray [id number] No Place 2 sprays into both nostrils daily.  Patient not taking: No sig reported   Annie Main, FNP Not Taking Active   losartan (COZAAR) 100 MG tablet [id number] Yes TAKE 1 TABLET BY MOUTH EVERY DAY Susy Frizzle, MD Taking Active   meclizine (ANTIVERT) 25 MG tablet [id number] No Take 1 tablet (25 mg total) by mouth 3 (three) times daily as needed for dizziness.  Patient not taking: No sig reported   Susy Frizzle, MD Not Taking Active   montelukast (SINGULAIR) 10 MG tablet [id number] Yes TAKE 1 TABLET BY MOUTH EVERY DAY Susy Frizzle, MD Taking Active   Multiple Vitamin (MULTIVITAMIN WITH MINERALS) TABS tablet [id number] Yes Take 1 tablet by mouth daily. [provider] Taking Active Self  SYMBICORT 80-4.5 MCG/ACT inhaler [id number] Yes TAKE 2 PUFFS BY MOUTH TWICE A DAY Susy Frizzle, MD Taking Active   tamoxifen (NOLVADEX) 20 MG tablet [id number] Yes TAKE 1 TABLET AT BEDTIME Magrinat, Virgie Dad, MD Taking Active   triamterene-hydrochlorothiazide [facility]) 37.5-25 MG tablet [id number] Yes TAKE 1 TABLET BY MOUTH AT BEDTIME. STOP AMLODIPINE Susy Frizzle, MD Taking Active             Patient Active Problem List   Diagnosis Date Noted   Acquired absence of right breast 01/30/2018   Status post bilateral breast reconstruction 06/24/2017   Acquired absence of bilateral breasts and nipples 03/22/2017   Breast cancer ([place]) 03/16/2017   History of breast cancer in female 01/27/2017   Malignant neoplasm of upper-inner quadrant of left breast in female, estrogen receptor positive ([place]) 01/03/2017   HTN (hypertension) 06/14/2016   Malignant neoplasm of lower-inner quadrant of right breast of female, estrogen receptor positive ([place]) 01/07/2016   Atherosclerosis of aorta ([place])    Smoker     Immunization  History  Administered Date(s) Administered   Influenza-Unspecified 04/28/2017   PFIZER(Purple Top)SARS-COV-2 Vaccination 06/10/2020    Conditions to be addressed/monitored:  HTN, Breast cancer, HLD?  Care Plan : General Pharmacy (Adult)  Updates made by Edythe Clarity, [facility] since 02/13/2021 12:00 AM     Problem: HTN, Breast cancer, HLD   Priority: High  Onset Date: 02/13/2021     Goal: Patient-Specific  Goal   Note:   Current Barriers:  None identified at this visit  Pharmacist Clinical Goal(s):  Patient will maintain control of BP as evidenced by home monitoring  through collaboration with PharmD and provider.   Interventions: 1:1 collaboration with Susy Frizzle, MD regarding development and update of comprehensive plan of care as evidenced by provider attestation and co-signature Inter-disciplinary care team collaboration (see longitudinal plan of care) Comprehensive medication review performed; medication list updated in electronic medical record  Hypertension (BP goal <140/90) -Controlled -Current treatment: Triamterene/HCTZ 37.5-25mg daily Losartan 117m daily -Medications previously tried: amlodipine  -Current home readings: not checking  -Current exercise habits: walking a few times weekly -Denies hypotensive/hypertensive symptoms -Educated on BP goals and benefits of medications for prevention of heart attack, stroke and kidney damage; Daily salt intake goal < 2300 mg; Exercise goal of 150 minutes per week; Importance of home blood pressure monitoring; Symptoms of hypotension and importance of maintaining adequate hydration; -Counseled to monitor BP at home if symptomatic, document, and provide log at future appointments -Recommended to continue current medication  Hyperlipidemia: (LDL goal < 100) -Controlled, last known lipid panel in 2016 -Current treatment: None -Medications previously tried: none noted   -Current exercise habits: occasional  walking -Educated on Cholesterol goals;  Recommended lipid panel at next CPE appointment in October. -Recommended continue current meds and make appt for physical in October.  Breast Cancer  -Controlled -Current treatment  Tamoxifen 220mdaily -Medications previously tried: none noted -Sees oncologist regularly  -Recommended to continue current medication  Asthma? (Goal: minimize symptoms) -Controlled -Current treatment  Montelukast 1023maily Symbicort 80-4.5mc30m puffs bid Albuterol HFA 90 mcg prn Albuterol 0.083% nebs prn -Medications previously tried: none noted -Reports consistent use of maintenance inhaler, Symbicort -Uses albuterol infrequently -Denies any SOB or wheezing recently  -Recommended to continue current medication Assessed patient finances. Inhalers are current all affordable and patient has no concerns with obtaining them.  Patient Goals/Self-Care Activities Patient will:  - take medications as prescribed focus on medication adherence by pill count check blood pressure when sypmtomatic, document, and provide at future appointments  Follow Up Plan: The care management team will reach out to the patient again over the next 180 days.       Medication Assistance: None required.  Patient affirms current coverage meets needs.  Compliance/Adherence/Medication fill history: Care Gaps: Shingrix Pneumovax  Star-Rating Drugs: None  Patient's preferred pharmacy is:  [id number] [place] - [address] [place] [id number]: [phone] pill box? No - takes them out of vials Pt endorses 100% compliance  We discussed: Benefits of medication synchronization, packaging and delivery as well as enhanced pharmacist oversight with [employer]. Patient decided to: Continue current medication management strategy  Care Plan and Follow Up Patient Decision:   Patient agrees to Care Plan and Follow-up.  Plan: The care management team will reach out to the patient again over the next 180 days.  ChrisBeverly MilchrmD Clinical Pharmacist [place])[phone]

## 2021-02-13 ENCOUNTER — Ambulatory Visit (INDEPENDENT_AMBULATORY_CARE_PROVIDER_SITE_OTHER): Payer: Medicare HMO | Admitting: Pharmacist

## 2021-02-13 DIAGNOSIS — I10 Essential (primary) hypertension: Secondary | ICD-10-CM | POA: Diagnosis not present

## 2021-02-13 DIAGNOSIS — Z853 Personal history of malignant neoplasm of breast: Secondary | ICD-10-CM

## 2021-02-13 NOTE — Patient Instructions (Addendum)
Visit Information   Goals Addressed             This Visit's Progress    Manage My Medicine       Timeframe:  Long-Range Goal Priority:  High Start Date: 02/13/21                            Expected End Date:  08/15/21                     Follow Up Date 05/29/21    - call for medicine refill 2 or 3 days before it runs out - call if I am sick and can't take my medicine - keep a list of all the medicines I take; vitamins and herbals too    Why is this important?   These steps will help you keep on track with your medicines.   Notes:         Patient Care Plan: General Pharmacy (Adult)     Problem Identified: HTN, Breast cancer, HLD   Priority: High  Onset Date: 02/13/2021     Goal: Patient-Specific Goal   Note:   Current Barriers:  None identified at this visit  Pharmacist Clinical Goal(s):  Patient will maintain control of BP as evidenced by home monitoring  through collaboration with PharmD and provider.   Interventions: 1:1 collaboration with Susy Frizzle, MD regarding development and update of comprehensive plan of care as evidenced by provider attestation and co-signature Inter-disciplinary care team collaboration (see longitudinal plan of care) Comprehensive medication review performed; medication list updated in electronic medical record  Hypertension (BP goal <140/90) -Controlled -Current treatment: Triamterene/HCTZ 37.5-25mg  daily Losartan 100mg  daily -Medications previously tried: amlodipine  -Current home readings: not checking  -Current exercise habits: walking a few times weekly -Denies hypotensive/hypertensive symptoms -Educated on BP goals and benefits of medications for prevention of heart attack, stroke and kidney damage; Daily salt intake goal < 2300 mg; Exercise goal of 150 minutes per week; Importance of home blood pressure monitoring; Symptoms of hypotension and importance of maintaining adequate hydration; -Counseled to monitor  BP at home if symptomatic, document, and provide log at future appointments -Recommended to continue current medication  Hyperlipidemia: (LDL goal < 100) -Controlled, last known lipid panel in 2016 -Current treatment: None -Medications previously tried: none noted   -Current exercise habits: occasional walking -Educated on Cholesterol goals;  Recommended lipid panel at next CPE appointment in October. -Recommended continue current meds and make appt for physical in October.  Breast Cancer  -Controlled -Current treatment  Tamoxifen 20mg  daily -Medications previously tried: none noted -Sees oncologist regularly  -Recommended to continue current medication  Asthma? (Goal: minimize symptoms) -Controlled -Current treatment  Montelukast 10mg  daily Symbicort 80-4.3mcg 2 puffs bid Albuterol HFA 90 mcg prn Albuterol 0.083% nebs prn -Medications previously tried: none noted -Reports consistent use of maintenance inhaler, Symbicort -Uses albuterol infrequently -Denies any SOB or wheezing recently  -Recommended to continue current medication Assessed patient finances. Inhalers are current all affordable and patient has no concerns with obtaining them.  Patient Goals/Self-Care Activities Patient will:  - take medications as prescribed focus on medication adherence by pill count check blood pressure when sypmtomatic, document, and provide at future appointments  Follow Up Plan: The care management team will reach out to the patient again over the next 180 days.      Krystal Brooks was given information about Chronic Care Management services today  including:  CCM service includes personalized support from designated clinical staff supervised by her physician, including individualized plan of care and coordination with other care providers 24/7 contact phone numbers for assistance for urgent and routine care needs. Standard insurance, coinsurance, copays and deductibles apply for chronic  care management only during months in which we provide at least 20 minutes of these services. Most insurances cover these services at 100%, however patients may be responsible for any copay, coinsurance and/or deductible if applicable. This service may help you avoid the need for more expensive face-to-face services. Only one practitioner may furnish and bill the service in a calendar month. The patient may stop CCM services at any time (effective at the end of the month) by phone call to the office staff.  Patient agreed to services and verbal consent obtained.   Patient verbalizes understanding of instructions provided today and agrees to view in [place].  Telephone follow up appointment with pharmacy team member scheduled for: 6 months  Edythe Clarity, [place]

## 2021-02-24 ENCOUNTER — Other Ambulatory Visit: Payer: Self-pay | Admitting: Family Medicine

## 2021-02-25 DIAGNOSIS — L2084 Intrinsic (allergic) eczema: Secondary | ICD-10-CM | POA: Diagnosis not present

## 2021-02-25 DIAGNOSIS — L218 Other seborrheic dermatitis: Secondary | ICD-10-CM | POA: Diagnosis not present

## 2021-02-28 DIAGNOSIS — J45909 Unspecified asthma, uncomplicated: Secondary | ICD-10-CM | POA: Diagnosis not present

## 2021-02-28 DIAGNOSIS — J0140 Acute pansinusitis, unspecified: Secondary | ICD-10-CM | POA: Diagnosis not present

## 2021-03-01 ENCOUNTER — Emergency Department (HOSPITAL_BASED_OUTPATIENT_CLINIC_OR_DEPARTMENT_OTHER)
Admission: EM | Admit: 2021-03-01 | Discharge: 2021-03-01 | Disposition: A | Discharge status: Home / Self Care | Payer: Medicare HMO | Attending: Emergency Medicine | Admitting: Emergency Medicine

## 2021-03-01 ENCOUNTER — Other Ambulatory Visit: Payer: Self-pay

## 2021-03-01 ENCOUNTER — Emergency Department (HOSPITAL_BASED_OUTPATIENT_CLINIC_OR_DEPARTMENT_OTHER): Payer: Medicare HMO | Admitting: Radiology

## 2021-03-01 ENCOUNTER — Emergency Department (HOSPITAL_BASED_OUTPATIENT_CLINIC_OR_DEPARTMENT_OTHER): Payer: Medicare HMO

## 2021-03-01 ENCOUNTER — Encounter (HOSPITAL_BASED_OUTPATIENT_CLINIC_OR_DEPARTMENT_OTHER): Payer: Self-pay | Admitting: *Deleted

## 2021-03-01 ENCOUNTER — Telehealth: Payer: Self-pay | Admitting: Nurse Practitioner

## 2021-03-01 DIAGNOSIS — Z87891 Personal history of nicotine dependence: Secondary | ICD-10-CM | POA: Insufficient documentation

## 2021-03-01 DIAGNOSIS — U071 COVID-19: Secondary | ICD-10-CM

## 2021-03-01 DIAGNOSIS — Z7951 Long term (current) use of inhaled steroids: Secondary | ICD-10-CM | POA: Diagnosis not present

## 2021-03-01 DIAGNOSIS — G40909 Epilepsy, unspecified, not intractable, without status epilepticus: Secondary | ICD-10-CM | POA: Insufficient documentation

## 2021-03-01 DIAGNOSIS — J0100 Acute maxillary sinusitis, unspecified: Secondary | ICD-10-CM

## 2021-03-01 DIAGNOSIS — Z79899 Other long term (current) drug therapy: Secondary | ICD-10-CM | POA: Insufficient documentation

## 2021-03-01 DIAGNOSIS — R569 Unspecified convulsions: Secondary | ICD-10-CM

## 2021-03-01 DIAGNOSIS — Z853 Personal history of malignant neoplasm of breast: Secondary | ICD-10-CM | POA: Diagnosis not present

## 2021-03-01 DIAGNOSIS — E876 Hypokalemia: Secondary | ICD-10-CM | POA: Diagnosis not present

## 2021-03-01 DIAGNOSIS — J45909 Unspecified asthma, uncomplicated: Secondary | ICD-10-CM | POA: Diagnosis not present

## 2021-03-01 DIAGNOSIS — I10 Essential (primary) hypertension: Secondary | ICD-10-CM | POA: Insufficient documentation

## 2021-03-01 DIAGNOSIS — R059 Cough, unspecified: Secondary | ICD-10-CM | POA: Diagnosis not present

## 2021-03-01 LAB — CBC WITH DIFFERENTIAL/PLATELET
Abs Immature Granulocytes: 0.39 10*3/uL — ABNORMAL HIGH (ref 0.00–0.07)
Basophils Absolute: 0.1 10*3/uL (ref 0.0–0.1)
Basophils Relative: 0 %
Eosinophils Absolute: 0 10*3/uL (ref 0.0–0.5)
Eosinophils Relative: 0 %
HCT: 36.4 % (ref 36.0–46.0)
Hemoglobin: 12 g/dL (ref 12.0–15.0)
Immature Granulocytes: 3 %
Lymphocytes Relative: 19 %
Lymphs Abs: 2.7 10*3/uL (ref 0.7–4.0)
MCH: 30 pg (ref 26.0–34.0)
MCHC: 33 g/dL (ref 30.0–36.0)
MCV: 91 fL (ref 80.0–100.0)
Monocytes Absolute: 1 10*3/uL (ref 0.1–1.0)
Monocytes Relative: 7 %
Neutro Abs: 9.9 10*3/uL — ABNORMAL HIGH (ref 1.7–7.7)
Neutrophils Relative %: 71 %
Platelets: 232 10*3/uL (ref 150–400)
RBC: 4 MIL/uL (ref 3.87–5.11)
RDW: 13.2 % (ref 11.5–15.5)
WBC: 14.1 10*3/uL — ABNORMAL HIGH (ref 4.0–10.5)
nRBC: 0 % (ref 0.0–0.2)

## 2021-03-01 LAB — COMPREHENSIVE METABOLIC PANEL
ALT: 20 U/L (ref 0–44)
AST: 15 U/L (ref 15–41)
Albumin: 4 g/dL (ref 3.5–5.0)
Alkaline Phosphatase: 42 U/L (ref 38–126)
Anion gap: 11 (ref 5–15)
BUN: 24 mg/dL — ABNORMAL HIGH (ref 8–23)
CO2: 27 mmol/L (ref 22–32)
Calcium: 9.1 mg/dL (ref 8.9–10.3)
Chloride: 96 mmol/L — ABNORMAL LOW (ref 98–111)
Creatinine, Ser: 1.14 mg/dL — ABNORMAL HIGH (ref 0.44–1.00)
GFR, Estimated: 53 mL/min — ABNORMAL LOW (ref >60)
Glucose, Bld: 123 mg/dL — ABNORMAL HIGH (ref 70–99)
Potassium: 3.1 mmol/L — ABNORMAL LOW (ref 3.5–5.1)
Sodium: 134 mmol/L — ABNORMAL LOW (ref 135–145)
Total Bilirubin: 0.3 mg/dL (ref 0.3–1.2)
Total Protein: 7.4 g/dL (ref 6.5–8.1)

## 2021-03-01 LAB — RESP PANEL BY RT-PCR (FLU A&B, COVID) ARPGX2
Influenza A by PCR: NEGATIVE
Influenza B by PCR: NEGATIVE
SARS Coronavirus 2 by RT PCR: POSITIVE — AB

## 2021-03-01 LAB — MAGNESIUM: Magnesium: 1.4 mg/dL — ABNORMAL LOW (ref 1.7–2.4)

## 2021-03-01 MED ORDER — SODIUM CHLORIDE 0.9 % IV BOLUS
1000.0000 mL | Freq: Once | INTRAVENOUS | Status: AC
  Administered 2021-03-01: 1000 mL via INTRAVENOUS

## 2021-03-01 MED ORDER — MAGNESIUM SULFATE 2 GM/50ML IV SOLN
2.0000 g | Freq: Once | INTRAVENOUS | Status: AC
  Administered 2021-03-01: 2 g via INTRAVENOUS
  Filled 2021-03-01: qty 50

## 2021-03-01 MED ORDER — SODIUM CHLORIDE 0.9 % IV SOLN: INTRAVENOUS | Status: DC

## 2021-03-01 MED ORDER — POTASSIUM CHLORIDE CRYS ER 20 MEQ PO TBCR
40.0000 meq | EXTENDED_RELEASE_TABLET | Freq: Once | ORAL | Status: AC
  Administered 2021-03-01: 40 meq via ORAL
  Filled 2021-03-01: qty 2

## 2021-03-01 NOTE — Telephone Encounter (Signed)
Received page from patient while on call.  Patient reports recently diagnosed with sinus infection, has been coughing and having a lot of congestion since last week.  Reports on Thursday, had an episode of "going out" for a few seconds after coughing.  She felt she lost consciousness.  Similar thing happened yesterday, however she was not coughing and was sitting in her recliner.  Per her report, husband stated she went out and was not responsive for about 15 seconds.  She is supposed to leave for vacation tomorrow and wanted to know if this was advisable.  I recommended she be seen in ER to evaluate syncopal episodes vs. Seizure like activity.

## 2021-03-01 NOTE — ED Triage Notes (Signed)
Pt states she has had a sinus infection, cough, productive green, Prednisone taper on Wednesday by Dermatology for Eczema. [facility] restarted taper does again. Last night 15 sec of legs shaking and not responsive. Felt fine after the few seconds

## 2021-03-01 NOTE — Discharge Instructions (Addendum)
Person Under Monitoring Name: Krystal Brooks  Location: [address] [place] [address]   Infection Prevention Recommendations for Individuals Confirmed to have, or Being Evaluated for, 2019 Novel Coronavirus (COVID-19) Infection Who Receive Care at Home  Individuals who are confirmed to have, or are being evaluated for, COVID-19 should follow the prevention steps below until a healthcare provider or local or state health department says they can return to normal activities.  Stay home except to get medical care You should restrict activities outside your home, except for getting medical care. Do not go to work, school, or public areas, and do not use public transportation or taxis.  Call ahead before visiting your doctor Before your medical appointment, call the healthcare provider and tell them that you have, or are being evaluated for, COVID-19 infection. This will help the healthcare provider's office take steps to keep other people from getting infected. Ask your healthcare provider to call the local or state health department.  Monitor your symptoms Seek prompt medical attention if your illness is worsening (e.g., difficulty breathing). Before going to your medical appointment, call the healthcare provider and tell them that you have, or are being evaluated for, COVID-19 infection. Ask your healthcare provider to call the local or state health department.  Wear a facemask You should wear a facemask that covers your nose and mouth when you are in the same room with other people and when you visit a healthcare provider. People who live with or visit you should also wear a facemask while they are in the same room with you.  Separate yourself from other people in your home As much as possible, you should stay in a different room from other people in your home. Also, you should use a separate bathroom, if available.  Avoid sharing household items You should  not share dishes, drinking glasses, cups, eating utensils, towels, bedding, or other items with other people in your home. After using these items, you should wash them thoroughly with soap and water.  Cover your coughs and sneezes Cover your mouth and nose with a tissue when you cough or sneeze, or you can cough or sneeze into your sleeve. Throw used tissues in a lined trash can, and immediately wash your hands with soap and water for at least 20 seconds or use an alcohol-based hand rub.  Wash your [facility] your hands often and thoroughly with soap and water for at least 20 seconds. You can use an alcohol-based hand sanitizer if soap and water are not available and if your hands are not visibly dirty. Avoid touching your eyes, nose, and mouth with unwashed hands.   Prevention Steps for Caregivers and Household Members of Individuals Confirmed to have, or Being Evaluated for, COVID-19 Infection Being Cared for in the Home  If you live with, or provide care at home for, a person confirmed to have, or being evaluated for, COVID-19 infection please follow these guidelines to prevent infection:  Follow healthcare provider's instructions Make sure that you understand and can help the patient follow any healthcare provider instructions for all care.  Provide for the patient's basic needs You should help the patient with basic needs in the home and provide support for getting groceries, prescriptions, and other personal needs.  Monitor the patient's symptoms If they are getting sicker, call his or her medical provider and tell them that the patient has, or is being evaluated for, COVID-19 infection. This will help the healthcare  provider's office take steps to keep other people from getting infected. Ask the healthcare provider to call the local or state health department.  Limit the number of people who have contact with the patient If possible, have only one caregiver for the  patient. Other household members should stay in another home or place of residence. If this is not possible, they should stay in another room, or be separated from the patient as much as possible. Use a separate bathroom, if available. Restrict visitors who do not have an essential need to be in the home.  Keep older adults, very young children, and other sick people away from the patient Keep older adults, very young children, and those who have compromised immune systems or chronic health conditions away from the patient. This includes people with chronic heart, lung, or kidney conditions, diabetes, and cancer.  Ensure good ventilation Make sure that shared spaces in the home have good air flow, such as from an air conditioner or an opened window, weather permitting.  Wash your hands often Wash your hands often and thoroughly with soap and water for at least 20 seconds. You can use an alcohol based hand sanitizer if soap and water are not available and if your hands are not visibly dirty. Avoid touching your eyes, nose, and mouth with unwashed hands. Use disposable paper towels to dry your hands. If not available, use dedicated cloth towels and replace them when they become wet.  Wear a facemask and gloves Wear a disposable facemask at all times in the room and gloves when you touch or have contact with the patient's blood, body fluids, and/or secretions or excretions, such as sweat, saliva, sputum, nasal mucus, vomit, urine, or feces.  Ensure the mask fits over your nose and mouth tightly, and do not touch it during use. Throw out disposable facemasks and gloves after using them. Do not reuse. Wash your hands immediately after removing your facemask and gloves. If your personal clothing becomes contaminated, carefully remove clothing and launder. Wash your hands after handling contaminated clothing. Place all used disposable facemasks, gloves, and other waste in a lined container before  disposing them with other household waste. Remove gloves and wash your hands immediately after handling these items.  Do not share dishes, glasses, or other household items with the patient Avoid sharing household items. You should not share dishes, drinking glasses, cups, eating utensils, towels, bedding, or other items with a patient who is confirmed to have, or being evaluated for, COVID-19 infection. After the person uses these items, you should wash them thoroughly with soap and water.  Wash laundry thoroughly Immediately remove and wash clothes or bedding that have blood, body fluids, and/or secretions or excretions, such as sweat, saliva, sputum, nasal mucus, vomit, urine, or feces, on them. Wear gloves when handling laundry from the patient. Read and follow directions on labels of laundry or clothing items and detergent. In general, wash and dry with the warmest temperatures recommended on the label.  Clean all areas the individual has used often Clean all touchable surfaces, such as counters, tabletops, doorknobs, bathroom fixtures, toilets, phones, keyboards, tablets, and bedside tables, every day. Also, clean any surfaces that may have blood, body fluids, and/or secretions or excretions on them. Wear gloves when cleaning surfaces the patient has come in contact with. Use a diluted bleach solution (e.g., dilute bleach with 1 part bleach and 10 parts water) or a household disinfectant with a label that says EPA-registered for coronaviruses. To make  a bleach solution at home, add 1 tablespoon of bleach to 1 quart (4 cups) of water. For a larger supply, add  cup of bleach to 1 gallon (16 cups) of water. Read labels of cleaning products and follow recommendations provided on product labels. Labels contain instructions for safe and effective use of the cleaning product including precautions you should take when applying the product, such as wearing gloves or eye protection and making sure you  have good ventilation during use of the product. Remove gloves and wash hands immediately after cleaning.  Monitor yourself for signs and symptoms of illness Caregivers and household members are considered close contacts, should monitor their health, and will be asked to limit movement outside of the home to the extent possible. Follow the monitoring steps for close contacts listed on the symptom monitoring form.   ? If you have additional questions, contact your local health department or call the epidemiologist on call at [phone] (available 24/7). ? This guidance is subject to change. For the most up-to-date guidance from [facility], please refer to their website: [email or web address]

## 2021-03-01 NOTE — ED Notes (Signed)
CRITICAL VALUE STICKER  CRITICAL VALUE:COVID +  RECEIVER (on-site recipient of call):Shawnie Pons, RN  DATE & TIME NOTIFIED: 03-01-2021 1137  MESSENGER (representative from lab):  MD NOTIFIED: Dr. Gilford Raid  TIME OF NOTIFICATION:1139  RESPONSE:

## 2021-03-01 NOTE — ED Provider Notes (Addendum)
[facility]GENC[facility] DEPT Provider Note   CSN: [id number] Arrival date & time: 03/01/21  1006     History Chief Complaint  Patient presents with   Cough   Sinusitis   Seizures    Krystal Brooks is a 65 y.o. female.  Pt presents to the ED today with a sinus infection, cough with productive sputum since June 24.  She went to UC yesterday and was put on prednisone and a z pack.  She took a home Covid test and it was negative.  Last night, pt was sitting on the couch watching TV and had a 15 second episode of her legs and arms shaking and she was unresponsive.  Afterwards, she was fine and did not remember the episode.  Her husband was there and told her what happened.  Pt has no hx of seizure d/o.  She is fully vaccinated + booster.      Past Medical History:  Diagnosis Date   Adie's pupil    LEFT EYE   Allergy    Asthma    Atherosclerosis of aorta (HCC)    bulge above renal arteries on mri 2016   Cancer ([place]) 2017   right breast ca   Cellulitis    chest   Complication of anesthesia    VERY HARD TO WAKE UP     Eczema    Hypertension    Personal history of radiation therapy    PONV (postoperative nausea and vomiting)    Smoker     Patient Active Problem List   Diagnosis Date Noted   Acquired absence of right breast 01/30/2018   Status post bilateral breast reconstruction 06/24/2017   Acquired absence of bilateral breasts and nipples 03/22/2017   Breast cancer ([place]) 03/16/2017   History of breast cancer in female 01/27/2017   Malignant neoplasm of upper-inner quadrant of left breast in female, estrogen receptor positive ([place]) 01/03/2017   HTN (hypertension) 06/14/2016   Malignant neoplasm of lower-inner quadrant of right breast of female, estrogen receptor positive ([place]) 01/07/2016   Atherosclerosis of aorta ([place])    Smoker     Past Surgical History:  Procedure Laterality Date   ABDOMINAL HYSTERECTOMY     BREAST BIOPSY Right 01/02/2016   BREAST  EXCISIONAL BIOPSY Right 1979   BREAST IMPLANT REMOVAL Bilateral 10/05/2017   Procedure: REMOVALOF BILATERAL BREAST IMPLANTS;  Surgeon: Wallace Going, DO;  Location: [place];  Service: Plastics;  Laterality: Bilateral;   BREAST LUMPECTOMY Right 01/27/2016   BREAST LUMPECTOMY WITH RADIOACTIVE SEED AND SENTINEL LYMPH NODE BIOPSY Right 01/27/2016   Procedure: BREAST LUMPECTOMY WITH RADIOACTIVE SEED AND SENTINEL LYMPH NODE BIOPSY;  Surgeon: Excell Seltzer, MD;  Location: [facility];  Service: General;  Laterality: Right;   BREAST RECONSTRUCTION WITH PLACEMENT OF TISSUE EXPANDER AND FLEX HD (ACELLULAR HYDRATED DERMIS) Bilateral 03/16/2017   Procedure: BILATERAL IMMEDIATE BREAST RECONSTRUCTION WITH PLACEMENT OF TISSUE EXPANDER AND FLEX HD (ACELLULAR HYDRATED DERMIS);  Surgeon: Wallace Going, DO;  Location: [facility];  Service: Plastics;  Laterality: Bilateral;   BREAST SURGERY  1979   cyst removed rt breast   CATARACT EXTRACTION W/ INTRAOCULAR LENS IMPLANT     RT EYE   CESAREAN SECTION     x2   EXCISION OF BREAST LESION Left 01/30/2018   Procedure: EXCISION OF LEFT BREAST SCAR;  Surgeon: Wallace Going, DO;  Location: [place];  Service: Plastics;  Laterality: Left;   LATISSIMUS FLAP TO BREAST Right 01/30/2018  Procedure: RIGHT LATISSIMUS MYOCUTANEOUS FLAP FOR RIGHT CHEST WALL RECONSTRUCTION;  Surgeon: Wallace Going, DO;  Location: [facility];  Service: Plastics;  Laterality: Right;   MASTECTOMY W/ SENTINEL NODE BIOPSY Bilateral 03/16/2017   Procedure: BILATERAL TOTAL MASTECTOMY WITH LEFT SENTINEL LYMPH NODE BIOPSY;  Surgeon: Excell Seltzer, MD;  Location: [facility];  Service: General;  Laterality: Bilateral;   REMOVAL OF BILATERAL TISSUE EXPANDERS WITH PLACEMENT OF BILATERAL BREAST IMPLANTS Bilateral 06/15/2017   Procedure: REMOVAL OF BILATERAL TISSUE EXPANDERS WITH PLACEMENT OF BILATERAL BREAST IMPLANTS;  Surgeon: Wallace Going, DO;  Location: [facility] [place];  Service: Plastics;  Laterality: Bilateral;   TOTAL MASTECTOMY Bilateral 03/16/2017   SENTINAL NODE BIOPSY     OB History   No obstetric history on file.     Family History  Problem Relation Age of Onset   Hearing loss Mother    Asthma Father    Cancer Father        melanoma, metastases    Diabetes Father    Heart disease Father    Early death Sister    Early death Paternal Grandfather    Cancer Maternal Grandmother 29       colon cancer    Breast cancer Neg Hx    Ovarian cancer Neg Hx     Social History   Tobacco Use   Smoking status: Former    Packs/day: 0.50    Years: 40.00    Pack years: 20.00    Types: Cigarettes   Smokeless tobacco: Never  Vaping Use   Vaping Use: Never used  Substance Use Topics   Alcohol use: Yes    Alcohol/week: 0.0 standard drinks    Comment: social   Drug use: No    Home Medications Prior to Admission medications   Medication Sig Start Date End Date Taking? Authorizing Provider  albuterol (PROVENTIL) (2.5 MG/3ML) 0.083% nebulizer solution TAKE 3 MLS (2.5 MG TOTAL) BY NEBULIZATION EVERY 6 HOURS AS NEEDED WHEEZING OR SHORTNESS OF BREATH. 12/28/19  Yes Bates, Crystal A, FNP  albuterol (VENTOLIN HFA) 108 (90 Base) MCG/ACT inhaler INHALE 1-2 PUFFS EVERY 6 HOURS AS NEEDED FOR WHEEZING 01/05/21  Yes Susy Frizzle, MD  Ascorbic Acid (VITAMIN C) 1000 MG tablet Take 1,000 mg by mouth 2 (two) times daily.    Yes [provider]  losartan (COZAAR) 100 MG tablet TAKE 1 TABLET BY MOUTH EVERY DAY 01/29/21  Yes Susy Frizzle, MD  montelukast (SINGULAIR) 10 MG tablet TAKE 1 TABLET BY MOUTH EVERY DAY 02/24/21  Yes Susy Frizzle, MD  SYMBICORT 80-4.5 MCG/ACT inhaler TAKE 2 PUFFS BY MOUTH TWICE A DAY 10/17/20  Yes Susy Frizzle, MD  tamoxifen (NOLVADEX) 20 MG tablet TAKE 1 TABLET AT BEDTIME 06/25/20  Yes Magrinat, Virgie Dad, MD  triamterene-hydrochlorothiazide (MAXZIDE-25) 37.5-25 MG tablet TAKE 1 TABLET BY MOUTH AT  BEDTIME. STOP AMLODIPINE 09/09/20  Yes Susy Frizzle, MD  fluticasone (FLONASE) 50 MCG/ACT nasal spray Place 2 sprays into both nostrils daily. Patient not taking: No sig reported 12/04/19   Annie Main, FNP  meclizine (ANTIVERT) 25 MG tablet Take 1 tablet (25 mg total) by mouth 3 (three) times daily as needed for dizziness. Patient not taking: No sig reported 11/03/18   Susy Frizzle, MD  Multiple Vitamin (MULTIVITAMIN WITH MINERALS) TABS tablet Take 1 tablet by mouth daily.    [provider]    Allergies    Penicillins, Sulfa antibiotics, and Levaquin [levofloxacin]  Review  of Systems   Review of Systems  Neurological:  Positive for seizures.  All other systems reviewed and are negative.  Physical Exam Updated Vital Signs BP (!) 118/59   Pulse 86   Temp 98.3 F (36.8 C) (Oral)   Resp 19   Ht 5\' 1"  (1.549 m)   Wt 60.8 kg   SpO2 100%   BMI 25.32 kg/m   Physical Exam Vitals and nursing note reviewed.  Constitutional:      Appearance: Normal appearance.  HENT:     Head: Normocephalic and atraumatic.     Right Ear: External ear normal.     Left Ear: External ear normal.     Nose: Nose normal.     Mouth/Throat:     Mouth: Mucous membranes are moist.     Pharynx: Oropharynx is clear.  Eyes:     Extraocular Movements: Extraocular movements intact.     Conjunctiva/sclera: Conjunctivae normal.     Pupils: Pupils are equal, round, and reactive to light.  Cardiovascular:     Rate and Rhythm: Normal rate and regular rhythm.     Pulses: Normal pulses.     Heart sounds: Normal heart sounds.  Pulmonary:     Effort: Pulmonary effort is normal.     Breath sounds: Normal breath sounds.  Abdominal:     General: Abdomen is flat. Bowel sounds are normal.     Palpations: Abdomen is soft.  Musculoskeletal:        General: Normal range of motion.     Cervical back: Normal range of motion and neck supple.  Skin:    General: Skin is warm.     Capillary Refill:  Capillary refill takes less than 2 seconds.  Neurological:     General: No focal deficit present.     Mental Status: She is alert and oriented to person, place, and time.  Psychiatric:        Mood and Affect: Mood normal.        Behavior: Behavior normal.        Thought Content: Thought content normal.        Judgment: Judgment normal.    ED Results / Procedures / Treatments   Labs (all labs ordered are listed, but only abnormal results are displayed) Labs Reviewed  RESP PANEL BY RT-PCR (FLU A&B, COVID) ARPGX2 - Abnormal; Notable for the following components:      Result Value   SARS Coronavirus 2 by RT PCR POSITIVE (*)    All other components within normal limits  CBC WITH DIFFERENTIAL/PLATELET - Abnormal; Notable for the following components:   WBC 14.1 (*)    Neutro Abs 9.9 (*)    Abs Immature Granulocytes 0.39 (*)    All other components within normal limits  COMPREHENSIVE METABOLIC PANEL - Abnormal; Notable for the following components:   Sodium 134 (*)    Potassium 3.1 (*)    Chloride 96 (*)    Glucose, Bld 123 (*)    BUN 24 (*)    Creatinine, Ser 1.14 (*)    GFR, Estimated 53 (*)    All other components within normal limits  MAGNESIUM - Abnormal; Notable for the following components:   Magnesium 1.4 (*)    All other components within normal limits  URINALYSIS, ROUTINE W REFLEX MICROSCOPIC    EKG EKG Interpretation  Date/Time:  Sunday March 01 2021 10:22:06 EDT Ventricular Rate:  98 PR Interval:  122 QRS Duration: 82 QT Interval:  341 QTC  Calculation: 436 R Axis:   -20 Text Interpretation: Sinus rhythm Multiform ventricular premature complexes Aberrant complex Borderline left axis deviation No significant change since last tracing Confirmed by Isla Pence [id number]) on 03/01/2021 10:50:05 AM  Radiology DG Chest 2 View  Result Date: 03/01/2021 CLINICAL DATA:  Cough. EXAM: CHEST - 2 VIEW COMPARISON:  June 13, 2015. FINDINGS: The heart size and mediastinal  contours are within normal limits. Both lungs are clear. The visualized skeletal structures are unremarkable. IMPRESSION: No active cardiopulmonary disease. Aortic Atherosclerosis (ICD10-I70.0). Electronically Signed   By: Marijo Conception M.D.   On: 03/01/2021 11:27   CT HEAD WO CONTRAST  Result Date: 03/01/2021 CLINICAL DATA:  Seizure. EXAM: CT HEAD WITHOUT CONTRAST TECHNIQUE: Contiguous axial images were obtained from the base of the skull through the vertex without intravenous contrast. COMPARISON:  None. FINDINGS: Brain: No evidence of acute infarction, hemorrhage, hydrocephalus, extra-axial collection or mass lesion/mass effect. Vascular: No hyperdense vessel or unexpected calcification. Skull: Normal. Negative for fracture or focal lesion. Sinuses/Orbits: Mastoid air cells are clear. Air-fluid level noted in the left maxillary sinus. Partial opacification of the left ethmoid air cells. Other: None. IMPRESSION: 1. No acute intracranial abnormalities. 2. Left maxillary sinus and ethmoid air cell opacification. Electronically Signed   By: Kerby Moors M.D.   On: 03/01/2021 11:26    Procedures Procedures   Medications Ordered in ED Medications  sodium chloride 0.9 % bolus 1,000 mL (1,000 mLs Intravenous New Bag/Given 03/01/21 1112)    And  0.9 %  sodium chloride infusion ( Intravenous New Bag/Given 03/01/21 1151)  magnesium sulfate IVPB 2 g 50 mL (2 g Intravenous New Bag/Given 03/01/21 1146)  potassium chloride SA (KLOR-CON) CR tablet 40 mEq (40 mEq Oral Given 03/01/21 1144)    ED Course  I have reviewed the triage vital signs and the nursing notes.  Pertinent labs & imaging results that were available during my care of the patient were reviewed by me and considered in my medical decision making (see chart for details).    MDM Rules/Calculators/A&P                          Pt is + for Covid.  She has had sx for more than 5 days, so Paxlovid will not be effective.  Continue prednisone and  zpack.  K and Mg low.  These are replaced.  Seizure from covid vs low K vs low mg?  Pt referred to neurology.  Pt knows not to drive or be alone in a pool, etc until cleared by neuro.  Pt instructed to return if worse.  F/u with pcp.  Krystal Brooks was evaluated in Emergency Department on 03/01/2021 for the symptoms described in the history of present illness. She was evaluated in the context of the global COVID-19 pandemic, which necessitated consideration that the patient might be at risk for infection with the SARS-CoV-2 virus that causes COVID-19. Institutional protocols and algorithms that pertain to the evaluation of patients at risk for COVID-19 are in a state of rapid change based on information released by regulatory bodies including the CDC and federal and state organizations. These policies and algorithms were followed during the patient's care in the ED.   Final Clinical Impression(s) / ED Diagnoses Final diagnoses:  COVID-19  Acute non-recurrent maxillary sinusitis  Hypokalemia  Hypomagnesemia  Seizure-like activity ([place])    Rx / DC Orders ED Discharge Orders  Ordered    Ambulatory referral to Neurology       Comments: An appointment is requested in approximately: 1 week   03/01/21 1208             Isla Pence, MD 03/01/21 1210    Isla Pence, MD 03/01/21 1231

## 2021-03-03 ENCOUNTER — Telehealth (INDEPENDENT_AMBULATORY_CARE_PROVIDER_SITE_OTHER): Payer: Medicare HMO | Admitting: Family Medicine

## 2021-03-03 ENCOUNTER — Other Ambulatory Visit: Payer: Self-pay

## 2021-03-03 DIAGNOSIS — Z853 Personal history of malignant neoplasm of breast: Secondary | ICD-10-CM

## 2021-03-03 DIAGNOSIS — R569 Unspecified convulsions: Secondary | ICD-10-CM | POA: Diagnosis not present

## 2021-03-03 MED ORDER — MAGNESIUM OXIDE 400 MG PO CAPS: 1.0000 | ORAL_CAPSULE | Freq: Every day | ORAL | 1 refills | Status: DC

## 2021-03-03 MED ORDER — POTASSIUM CHLORIDE CRYS ER 10 MEQ PO TBCR: 10.0000 meq | EXTENDED_RELEASE_TABLET | Freq: Every day | ORAL | 2 refills | Status: DC

## 2021-03-03 NOTE — Addendum Note (Signed)
Addended by: Jenna Luo T on: 03/03/2021 01:07 PM   Modules accepted: Orders

## 2021-03-03 NOTE — Progress Notes (Signed)
Subjective:    Patient ID: Krystal Brooks, female    DOB: [birth year], 65 y.o.   MRN: [id number]  HPI Patient is being seen today as a telephone visit.  Phone call began at 1011.  Phone call concluded at 1025.  The patient is currently at home.  I am currently in my office.  She consents to be seen via telephone.  Patient states that she developed a cold on June 24.  She has not felt well ever since that time.  Saturday, July 2, she was sitting watching TV.  Her husband states that she started having a seizure.  For 10 to 12 seconds, she was shaking in both legs and was unresponsive.  She denies any bowel or bladder incontinence.  She did not bite her tongue.  She states that when she came to there was not a postictal phase however this obviously concerned him and they went directly to the emergency room.  In the emergency room, she had a CT scan of the head which showed left maxillary sinusitis.  She also had hypokalemia with a potassium of 3.1 and a magnesium level of 1.4.  She also tested positive for COVID.  She was already on azithromycin and prednisone for the sinus infection as prescribed by an urgent care.  She states that today she feels 100% better.  The sinusitis is essentially resolved.  She is off antibiotics now and she is on her last day of the prednisone.  She is following up regarding the hypokalemia hypomagnesemia, and the potential seizure that she experienced.  She does have a history of breast cancer but again the head CT was negative.  She denies any headaches or neck stiffness at the present time.  She denies any confusion.  She denies any neurologic deficits. Past Medical History:  Diagnosis Date   Adie's pupil    LEFT EYE   Allergy    Asthma    Atherosclerosis of aorta (HCC)    bulge above renal arteries on mri 2016   Cancer ([place]) 2017   right breast ca   Cellulitis    chest   Complication of anesthesia    VERY HARD TO WAKE UP     Eczema    Hypertension    Personal  history of radiation therapy    PONV (postoperative nausea and vomiting)    Smoker    Past Surgical History:  Procedure Laterality Date   ABDOMINAL HYSTERECTOMY     BREAST BIOPSY Right 01/02/2016   BREAST EXCISIONAL BIOPSY Right 1979   BREAST IMPLANT REMOVAL Bilateral 10/05/2017   Procedure: REMOVALOF BILATERAL BREAST IMPLANTS;  Surgeon: Wallace Going, DO;  Location: [place];  Service: Plastics;  Laterality: Bilateral;   BREAST LUMPECTOMY Right 01/27/2016   BREAST LUMPECTOMY WITH RADIOACTIVE SEED AND SENTINEL LYMPH NODE BIOPSY Right 01/27/2016   Procedure: BREAST LUMPECTOMY WITH RADIOACTIVE SEED AND SENTINEL LYMPH NODE BIOPSY;  Surgeon: Excell Seltzer, MD;  Location: [place];  Service: General;  Laterality: Right;   BREAST RECONSTRUCTION WITH PLACEMENT OF TISSUE EXPANDER AND FLEX HD (ACELLULAR HYDRATED DERMIS) Bilateral 03/16/2017   Procedure: BILATERAL IMMEDIATE BREAST RECONSTRUCTION WITH PLACEMENT OF TISSUE EXPANDER AND FLEX HD (ACELLULAR HYDRATED DERMIS);  Surgeon: Wallace Going, DO;  Location: [place];  Service: Plastics;  Laterality: Bilateral;   BREAST SURGERY  1979   cyst removed rt breast   CATARACT EXTRACTION W/ INTRAOCULAR LENS IMPLANT     RT EYE   CESAREAN SECTION  x2   EXCISION OF BREAST LESION Left 01/30/2018   Procedure: EXCISION OF LEFT BREAST SCAR;  Surgeon: Wallace Going, DO;  Location: [place];  Service: Plastics;  Laterality: Left;   LATISSIMUS FLAP TO BREAST Right 01/30/2018   Procedure: RIGHT LATISSIMUS MYOCUTANEOUS FLAP FOR RIGHT CHEST WALL RECONSTRUCTION;  Surgeon: Wallace Going, DO;  Location: [place];  Service: Plastics;  Laterality: Right;   MASTECTOMY W/ SENTINEL NODE BIOPSY Bilateral 03/16/2017   Procedure: BILATERAL TOTAL MASTECTOMY WITH LEFT SENTINEL LYMPH NODE BIOPSY;  Surgeon: Excell Seltzer, MD;  Location: [place];  Service: General;  Laterality: Bilateral;   REMOVAL OF BILATERAL TISSUE EXPANDERS  WITH PLACEMENT OF BILATERAL BREAST IMPLANTS Bilateral 06/15/2017   Procedure: REMOVAL OF BILATERAL TISSUE EXPANDERS WITH PLACEMENT OF BILATERAL BREAST IMPLANTS;  Surgeon: Wallace Going, DO;  Location: [place];  Service: Plastics;  Laterality: Bilateral;   TOTAL MASTECTOMY Bilateral 03/16/2017   SENTINAL NODE BIOPSY   Current Outpatient Medications on File Prior to Visit  Medication Sig Dispense Refill   albuterol (PROVENTIL) (2.5 MG/3ML) 0.083% nebulizer solution TAKE 3 MLS (2.5 MG TOTAL) BY NEBULIZATION EVERY 6 HOURS AS NEEDED WHEEZING OR SHORTNESS OF BREATH. 300 mL 5   albuterol (VENTOLIN HFA) 108 (90 Base) MCG/ACT inhaler INHALE 1-2 PUFFS EVERY 6 HOURS AS NEEDED FOR WHEEZING 18 each 5   Ascorbic Acid (VITAMIN C) 1000 MG tablet Take 1,000 mg by mouth 2 (two) times daily.      fluticasone (FLONASE) 50 MCG/ACT nasal spray Place 2 sprays into both nostrils daily. (Patient not taking: No sig reported) 16 g 6   losartan (COZAAR) 100 MG tablet TAKE 1 TABLET BY MOUTH EVERY DAY 30 tablet 5   meclizine (ANTIVERT) 25 MG tablet Take 1 tablet (25 mg total) by mouth 3 (three) times daily as needed for dizziness. (Patient not taking: No sig reported) 30 tablet 0   montelukast (SINGULAIR) 10 MG tablet TAKE 1 TABLET BY MOUTH EVERY DAY 30 tablet 11   Multiple Vitamin (MULTIVITAMIN WITH MINERALS) TABS tablet Take 1 tablet by mouth daily.     SYMBICORT 80-4.5 MCG/ACT inhaler TAKE 2 PUFFS BY MOUTH TWICE A DAY 10.2 each 3   tamoxifen (NOLVADEX) 20 MG tablet TAKE 1 TABLET AT BEDTIME 30 tablet 14   triamterene-hydrochlorothiazide (MAXZIDE-25) 37.5-25 MG tablet TAKE 1 TABLET BY MOUTH AT BEDTIME. STOP AMLODIPINE 90 tablet 3   No current facility-administered medications on file prior to visit.   Marland Kitchenall Social History   Socioeconomic History   Marital status: Married    Spouse name: Not on file   Number of children: Not on file   Years of education: Not on file   Highest education level:  Not on file  Occupational History   Not on file  Tobacco Use   Smoking status: Former    Packs/day: 0.50    Years: 40.00    Pack years: 20.00    Types: Cigarettes   Smokeless tobacco: Never  Vaping Use   Vaping Use: Never used  Substance and Sexual Activity   Alcohol use: Yes    Alcohol/week: 0.0 standard drinks    Comment: social   Drug use: No   Sexual activity: Yes    Birth control/protection: Surgical    Comment: hysterectomy  Other Topics Concern   Not on file  Social History Narrative   Not on file   Social Determinants of Health   Financial Resource Strain: Low Risk    Difficulty of Paying  Living Expenses: Not hard at all  Food Insecurity: No Food Insecurity   Worried About [place] in the Last Year: Never true   Ran Out of Food in the Last Year: Never true  Transportation Needs: No Transportation Needs   Lack of Transportation (Medical): No   Lack of Transportation (Non-Medical): No  Physical Activity: Inactive   Days of Exercise per Week: 0 days   Minutes of Exercise per Session: 0 min  Stress: No Stress Concern Present   Feeling of Stress : Not at all  Social Connections: Moderately Integrated   Frequency of Communication with Friends and Family: More than three times a week   Frequency of Social Gatherings with Friends and Family: More than three times a week   Attends Religious Services: More than 4 times per year   Active Member of [employer] or Organizations: No   Attends Archivist Meetings: Never   Marital Status: Married  Human resources officer Violence: Not At Risk   Fear of Current or Ex-Partner: No   Emotionally Abused: No   Physically Abused: No   Sexually Abused: No      Review of Systems  All other systems reviewed and are negative.     Objective:   Physical Exam        Assessment & Plan:   History of breast cancer in female  Convulsions, unspecified convulsion type ([place]) I will treat her hypokalemia with K. Dur  20 mill equivalents p.o. daily and recommended that she come in next week and let us recheck potassium level.  Begin magnesium oxide 400 mg p.o. daily and recheck a magnesium level in 1 week.  Her sinus infection seems to have recovered after taking the Z-Pak symptomatically she feels much better.  She is essentially off the prednisone after today.  Therefore I do not feel that sinus infection requires any further follow-up.  She is outside the therapeutic window for paxlovid however she is recovering from [place] without complications.  I will schedule a follow-up appointment with a neurologist given the seizure activity that she had.  This is very atypical.  She has no previous history and head CT was unremarkable.  I do not believe that the electrolyte abnormalities would be sufficient to cause a seizure.  She also was not febrile at the time of the seizure.  Therefore I would appreciate their input as to whether she requires an EEG as I do not feel that this would be considered a febrile seizure

## 2021-03-05 ENCOUNTER — Encounter: Payer: Self-pay | Admitting: Neurology

## 2021-03-10 ENCOUNTER — Encounter: Payer: Self-pay | Admitting: Family Medicine

## 2021-03-10 DIAGNOSIS — C44722 Squamous cell carcinoma of skin of right lower limb, including hip: Secondary | ICD-10-CM | POA: Diagnosis not present

## 2021-03-13 ENCOUNTER — Ambulatory Visit: Payer: Medicare HMO | Admitting: Neurology

## 2021-03-13 ENCOUNTER — Other Ambulatory Visit: Payer: Self-pay

## 2021-03-13 ENCOUNTER — Encounter: Payer: Self-pay | Admitting: Neurology

## 2021-03-13 VITALS — BP 132/74 | HR 82 | Ht 61.0 in | Wt 124.0 lb

## 2021-03-13 DIAGNOSIS — R4020 Unspecified coma: Secondary | ICD-10-CM | POA: Diagnosis not present

## 2021-03-13 DIAGNOSIS — R55 Syncope and collapse: Secondary | ICD-10-CM | POA: Diagnosis not present

## 2021-03-13 NOTE — Patient Instructions (Signed)
Schedule open MRI brain with and without contrast  2. Schedule 1-hour EEG  3. Our office will call with results, if normal, follow-up as needed.

## 2021-03-13 NOTE — Progress Notes (Signed)
NEUROLOGY CONSULTATION NOTE  Krystal Brooks MRN: [id number] DOB: 1955/12/01  Referring provider: Dr. Isla Pence (ER) Primary care provider: Dr. Jenna Luo  Reason for consult:  seizuzre  Dear Dr Gilford Raid:  Thank you for your kind referral of Krystal Brooks for consultation of the above symptoms. Although her history is well known to you, please allow me to reiterate it for the purpose of our medical record. The patient was accompanied to the clinic by her husband who also provides collateral information. Records and images were personally reviewed where available.   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 She had an upper respiratory infection that started on 6/24 and had been having a lot of congestion, feeling horrible. On 6/30, she was talking to her neighbor and recalls coughing really hard then passing out. She fell back and her neighbor reported she fainted with slight shaking of her legs. She woke up on the ground and felt completely normal. Her husband checked BP and it was normal. She went to Urgent care on 7/2 and started on Prednisone and Z-pack. That evening, she was on the couch when her husband saw her arms and legs shaking for 10 seconds, he touched her and she woke up with no post-event confusion. No tongue bite or incontinence, she felt fine, no drowsiness or soreness. She went to the ER the next day and was COVID positive. WBC 14.1. K (3.1) and Mg (1.4) were low and repleted in the ER. EKG showed sinus rhythm with PVCs. I personally reviewed head CT without contrast which did not show any acute changes. She reports sleeping okay but had been coughing all night, no alcohol use. They deny any staring/unresponsive episodes, gaps in time, olfactory/gustatory hallucinations, deja vu, rising epigastric  sensation, focal numbness/tingling/weakness, myoclonic jerks. She denies any headaches (except when she had COVID), dizziness, diplopia, dysarthria/dysphagia, neck/back pain, bowel/bladder dysfunction. Memory is good. She has a lot of leg cramps at night. She had a normal birth and early development.  There is no history of febrile convulsions, CNS infections such as meningitis/encephalitis, significant traumatic brain injury, neurosurgical procedures, or family history of seizures.   PAST MEDICAL HISTORY: Past Medical History:  Diagnosis Date   Adie's pupil    LEFT EYE   Allergy    Asthma    Atherosclerosis of aorta (HCC)    bulge above renal arteries on mri 2016   Cancer ([place]) 2017   right breast ca   Cellulitis    chest   Complication of anesthesia    VERY HARD TO WAKE UP     Eczema    Hypertension    Personal history of radiation therapy    PONV (postoperative nausea and vomiting)    Smoker     PAST SURGICAL HISTORY: Past Surgical History:  Procedure Laterality Date   ABDOMINAL HYSTERECTOMY     BREAST BIOPSY Right 01/02/2016   BREAST EXCISIONAL BIOPSY Right 1979   BREAST IMPLANT REMOVAL Bilateral 10/05/2017   Procedure: REMOVALOF BILATERAL BREAST IMPLANTS;  Surgeon: Wallace Going, DO;  Location: [place];  Service: Plastics;  Laterality: Bilateral;   BREAST LUMPECTOMY Right 01/27/2016   BREAST LUMPECTOMY WITH RADIOACTIVE SEED AND SENTINEL LYMPH NODE BIOPSY Right 01/27/2016   Procedure: BREAST LUMPECTOMY WITH RADIOACTIVE SEED AND SENTINEL LYMPH NODE BIOPSY;  Surgeon: Excell Seltzer, MD;  Location: [place];  Service: General;  Laterality: Right;   BREAST RECONSTRUCTION WITH PLACEMENT OF TISSUE EXPANDER AND FLEX HD (ACELLULAR HYDRATED DERMIS) Bilateral 03/16/2017   Procedure: BILATERAL IMMEDIATE BREAST RECONSTRUCTION WITH PLACEMENT OF TISSUE EXPANDER AND FLEX HD (ACELLULAR HYDRATED DERMIS);  Surgeon: Wallace Going, DO;  Location:  [place];  Service: Plastics;  Laterality: Bilateral;   BREAST SURGERY  1979   cyst removed rt breast   CATARACT EXTRACTION W/ INTRAOCULAR LENS IMPLANT     RT EYE   CESAREAN SECTION     x2   EXCISION OF BREAST LESION Left 01/30/2018   Procedure: EXCISION OF LEFT BREAST SCAR;  Surgeon: Wallace Going, DO;  Location: [facility];  Service: Plastics;  Laterality: Left;   LATISSIMUS FLAP TO BREAST Right 01/30/2018   Procedure: RIGHT LATISSIMUS MYOCUTANEOUS FLAP FOR RIGHT CHEST WALL RECONSTRUCTION;  Surgeon: Wallace Going, DO;  Location: [place];  Service: Plastics;  Laterality: Right;   MASTECTOMY W/ SENTINEL NODE BIOPSY Bilateral 03/16/2017   Procedure: BILATERAL TOTAL MASTECTOMY WITH LEFT SENTINEL LYMPH NODE BIOPSY;  Surgeon: Excell Seltzer, MD;  Location: [place];  Service: General;  Laterality: Bilateral;   REMOVAL OF BILATERAL TISSUE EXPANDERS WITH PLACEMENT OF BILATERAL BREAST IMPLANTS Bilateral 06/15/2017   Procedure: REMOVAL OF BILATERAL TISSUE EXPANDERS WITH PLACEMENT OF BILATERAL BREAST IMPLANTS;  Surgeon: Wallace Going, DO;  Location: [place] [facility];  Service: Plastics;  Laterality: Bilateral;   TOTAL MASTECTOMY Bilateral 03/16/2017   SENTINAL NODE BIOPSY    MEDICATIONS: Current Outpatient Medications on File Prior to Visit  Medication Sig Dispense Refill   albuterol (PROVENTIL) (2.5 MG/3ML) 0.083% nebulizer solution TAKE 3 MLS (2.5 MG TOTAL) BY NEBULIZATION EVERY 6 HOURS AS NEEDED WHEEZING OR SHORTNESS OF BREATH. 300 mL 5   albuterol (VENTOLIN HFA) 108 (90 Base) MCG/ACT inhaler INHALE 1-2 PUFFS EVERY 6 HOURS AS NEEDED FOR WHEEZING 18 each 5   Ascorbic Acid (VITAMIN C) 1000 MG tablet Take 1,000 mg by mouth 2 (two) times daily.      losartan (COZAAR) 100 MG tablet TAKE 1 TABLET BY MOUTH EVERY DAY 30 tablet 5   Magnesium Oxide 400 MG CAPS Take 1 capsule (400 mg total) by mouth daily. 30 capsule 1   montelukast (SINGULAIR) 10 MG tablet TAKE 1 TABLET BY MOUTH EVERY DAY  30 tablet 11   Multiple Vitamin (MULTIVITAMIN WITH MINERALS) TABS tablet Take 1 tablet by mouth daily.     potassium chloride (KLOR-CON) 10 MEQ tablet Take 1 tablet (10 mEq total) by mouth daily. 30 tablet 2   SYMBICORT 80-4.5 MCG/ACT inhaler TAKE 2 PUFFS BY MOUTH TWICE A DAY 10.2 each 3   tamoxifen (NOLVADEX) 20 MG tablet TAKE 1 TABLET AT BEDTIME 30 tablet 14   triamterene-hydrochlorothiazide (MAXZIDE-25) 37.5-25 MG tablet TAKE 1 TABLET BY MOUTH AT BEDTIME. STOP AMLODIPINE 90 tablet 3   fluticasone (FLONASE) 50 MCG/ACT nasal spray Place 2 sprays into both nostrils daily. (Patient not taking: No sig reported) 16 g 6   meclizine (ANTIVERT) 25 MG tablet Take 1 tablet (25 mg total) by mouth 3 (three) times daily as needed for dizziness. (Patient not taking: Reported on 03/13/2021) 30 tablet 0   No current facility-administered medications on file prior to visit.    ALLERGIES: Allergies  Allergen Reactions   Penicillins Swelling, Rash and Other (See Comments)    Older sister died from reaction to penicillin Has patient had a PCN reaction causing immediate rash, facial/tongue/throat swelling, SOB or  lightheadedness with hypotension: Yes Has patient had a PCN reaction causing severe rash involving mucus membranes or skin necrosis: No Has patient had a PCN reaction that required hospitalization: No Has patient had a PCN reaction occurring within the last 10 years: No If all of the above answers are "NO", then may proceed with Cephalosporin use.    Sulfa Antibiotics Rash   Levaquin [Levofloxacin] Nausea And Vomiting    FAMILY HISTORY: Family History  Problem Relation Age of Onset   Hearing loss Mother    Asthma Father    Cancer Father        melanoma, metastases    Diabetes Father    Heart disease Father    Early death Sister    Early death Paternal Grandfather    Cancer Maternal Grandmother 71       colon cancer    Breast cancer Neg Hx    Ovarian cancer Neg Hx     SOCIAL  HISTORY: Social History   Socioeconomic History   Marital status: Married    Spouse name: Not on file   Number of children: Not on file   Years of education: Not on file   Highest education level: Not on file  Occupational History   Not on file  Tobacco Use   Smoking status: Former    Packs/day: 0.50    Years: 40.00    Pack years: 20.00    Types: Cigarettes   Smokeless tobacco: Never  Vaping Use   Vaping Use: Never used  Substance and Sexual Activity   Alcohol use: Yes    Alcohol/week: 0.0 standard drinks    Comment: social   Drug use: No   Sexual activity: Yes    Birth control/protection: Surgical    Comment: hysterectomy  Other Topics Concern   Not on file  Social History Narrative   Three story home   Drinks caffeine   Right handed   Social Determinants of Health   Financial Resource Strain: Low Risk    Difficulty of Paying Living Expenses: Not hard at all  Food Insecurity: No Food Insecurity   Worried About Charity fundraiser in the Last Year: Never true   Ran Out of Food in the Last Year: Never true  Transportation Needs: No Transportation Needs   Lack of Transportation (Medical): No   Lack of Transportation (Non-Medical): No  Physical Activity: Inactive   Days of Exercise per Week: 0 days   Minutes of Exercise per Session: 0 min  Stress: No Stress Concern Present   Feeling of Stress : Not at all  Social Connections: Moderately Integrated   Frequency of Communication with Friends and Family: More than three times a week   Frequency of Social Gatherings with Friends and Family: More than three times a week   Attends Religious Services: More than 4 times per year   Active Member of [employer] or Organizations: No   Attends Music therapist: Never   Marital Status: Married  Human resources officer Violence: Not At Risk   Fear of Current or Ex-Partner: No   Emotionally Abused: No   Physically Abused: No   Sexually Abused: No     PHYSICAL  EXAM: Vitals:   03/13/21 0837  BP: 132/74  Pulse: 82  SpO2: 98%   General: No acute distress Head:  Normocephalic/atraumatic Skin/Extremities: No rash, no edema Neurological Exam: Mental status: alert and oriented to person, place, and time, no dysarthria or aphasia, Fund of knowledge is appropriate.  Recent and remote memory are intact, 3/3 delayed recall.  Attention and concentration are normal.   Cranial nerves: CN I: not tested CN II: pupils equal, round and reactive to light, visual fields intact CN III, IV, VI:  full range of motion, no nystagmus, no ptosis CN V: facial sensation intact CN VII: upper and lower face symmetric CN VIII: hearing intact to conversation Bulk & Tone: normal, no fasciculations. Motor: 5/5 throughout with no pronator drift. Sensation: intact to light touch, cold, pin, vibration and joint position sense.  No extinction to double simultaneous stimulation.  Romberg test negative Deep Tendon Reflexes: +1 throughout Cerebellar: no incoordination on finger to nose testing Gait: narrow-based and steady, able to tandem walk adequately. 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 She had been sick with COVID and had a syncopal episode on 6/30, then another episode of very brief loss of consciousness with body shaking, no post-event confusion/post-ictal state. Symptoms suggestive of convulsive syncope, however with history of breast cancer, would do MRI brain with and without contrast and 1-hour EEG to assess for focal abnormalities that increase risk for recurrent seizures. [place] driving laws were discussed with the patient, and she knows to stop driving after a seizure, until 6 months seizure-free, however since event appears provoked by acute illness, if tests are normal, she may return to driving.  Our office will call with results, if normal, follow-up as needed, call for any changes.    Thank you for allowing me to participate in the care of this patient. Please do not hesitate to call for any questions or concerns.   Ellouise Newer, M.D.  CC: Dr. Dennard Schaumann, Dr. Gilford Raid

## 2021-03-16 ENCOUNTER — Other Ambulatory Visit: Payer: Self-pay

## 2021-03-16 ENCOUNTER — Ambulatory Visit (INDEPENDENT_AMBULATORY_CARE_PROVIDER_SITE_OTHER): Payer: Medicare HMO | Admitting: Neurology

## 2021-03-16 DIAGNOSIS — R55 Syncope and collapse: Secondary | ICD-10-CM | POA: Diagnosis not present

## 2021-03-16 DIAGNOSIS — R4020 Unspecified coma: Secondary | ICD-10-CM

## 2021-03-18 ENCOUNTER — Telehealth: Payer: Self-pay | Admitting: Neurology

## 2021-03-18 NOTE — Telephone Encounter (Signed)
Pt said [facility] has not recvd referral. Would like to know what to do. [phone]

## 2021-03-19 NOTE — Telephone Encounter (Signed)
Working on PA

## 2021-03-20 ENCOUNTER — Telehealth: Payer: Self-pay

## 2021-03-20 NOTE — Telephone Encounter (Signed)
Pt called informed EEG is normal

## 2021-03-20 NOTE — Telephone Encounter (Signed)
Pt called and informed that this nurse called insurance and got her MRI at [facility] approved case number [id number] auth number [id number] approval dates 03/20/21 until 09/16/21.   Pt is asking for Dr Delice Lesch to call her in Valium to take the day of her MRI to the pharmacy on file

## 2021-03-20 NOTE — Procedures (Signed)
ELECTROENCEPHALOGRAM REPORT  Date of Study: 03/16/2021  Patient's Name: Krystal Brooks MRN: [id number] Date of Birth: 08-17-1956  Referring Provider: Dr. Ellouise Newer  Clinical History:  This is a 65 year old woman with episode of loss of consciousness with shaking on 02/28/21.   Medications: PROVENTIL (2.5 MG/3ML) 0.083% nebulizer solution VENTOLIN HFA 108 (90 Base) MCG/ACT inhaler VITAMIN C 1000 MG tablet COZAAR 100 MG tablet Magnesium Oxide 400 MG CAPS SINGULAIR 10 MG tablet MULTIVITAMIN WITH MINERALS TABS tablet KLOR-CON 10 MEQ tablet   SYMBICORT 80-4.5 MCG/ACT inhaler NOLVADEX 20 MG tablet MAXZIDE-25 37.5-25 MG tablet FLONASE 50 MCG/ACT nasal spray  Technical Summary: A multichannel digital 1-hour EEG recording measured by the international 10-20 system with electrodes applied with paste and impedances below 5000 ohms performed in our laboratory with EKG monitoring in an awake and drowsy patient.  Hyperventilation was not performed. Photic stimulation was performed.  The digital EEG was referentially recorded, reformatted, and digitally filtered in a variety of bipolar and referential montages for optimal display.    Description: The patient is awake and drowsy during the recording.  During maximal wakefulness, there is a symmetric, medium voltage 10 Hz posterior dominant rhythm that attenuates with eye opening.  The record is symmetric.  During drowsiness, there is an increase in theta slowing of the background.  Sleep was not captured. Photic stimulation did not elicit any abnormalities.  There were no epileptiform discharges or electrographic seizures seen.    EKG lead was unremarkable.  Impression: This 1-hour awake and drowsy EEG is normal.    Clinical Correlation: A normal EEG does not exclude a clinical diagnosis of epilepsy.  If further clinical questions remain, prolonged EEG may be helpful.  Clinical correlation is advised.   Ellouise Newer, M.D.

## 2021-03-20 NOTE — Telephone Encounter (Signed)
Pt insurance will not cover [facility], pt stated that she has to have an open MRI paperwork is on Dr Delice Lesch desk,

## 2021-03-20 NOTE — Telephone Encounter (Signed)
Patient called requesting an update on the "open MRI order."   She said she called her insurance company and they had no record of it.

## 2021-03-20 NOTE — Telephone Encounter (Signed)
-----   Message from [name], MD sent at 03/20/2021  9:48 AM EDT ----- Pls let her know the EEG was normal. Are there any other open MRIs in the area or maybe have her ask [facility] where they suggest she go then? Thanks

## 2021-03-23 MED ORDER — DIAZEPAM 5 MG PO TABS: ORAL_TABLET | ORAL | 0 refills | Status: DC

## 2021-03-23 NOTE — Telephone Encounter (Signed)
Thanks. Rx for Valium sent

## 2021-03-24 DIAGNOSIS — D485 Neoplasm of uncertain behavior of skin: Secondary | ICD-10-CM | POA: Diagnosis not present

## 2021-03-25 ENCOUNTER — Other Ambulatory Visit: Payer: Medicare HMO

## 2021-03-25 ENCOUNTER — Other Ambulatory Visit: Payer: Self-pay

## 2021-03-25 DIAGNOSIS — I10 Essential (primary) hypertension: Secondary | ICD-10-CM | POA: Diagnosis not present

## 2021-03-25 DIAGNOSIS — Z136 Encounter for screening for cardiovascular disorders: Secondary | ICD-10-CM

## 2021-03-25 DIAGNOSIS — Z13220 Encounter for screening for lipoid disorders: Secondary | ICD-10-CM

## 2021-03-25 DIAGNOSIS — E875 Hyperkalemia: Secondary | ICD-10-CM | POA: Diagnosis not present

## 2021-03-26 LAB — COMPLETE METABOLIC PANEL WITH GFR
AG Ratio: 1.2 (calc) (ref 1.0–2.5)
ALT: 19 U/L (ref 6–29)
AST: 19 U/L (ref 10–35)
Albumin: 4 g/dL (ref 3.6–5.1)
Alkaline phosphatase (APISO): 43 U/L (ref 37–153)
BUN: 21 mg/dL (ref 7–25)
CO2: 28 mmol/L (ref 20–32)
Calcium: 9 mg/dL (ref 8.6–10.4)
Chloride: 98 mmol/L (ref 98–110)
Creat: 0.89 mg/dL (ref 0.50–1.05)
Globulin: 3.3 g/dL (calc) (ref 1.9–3.7)
Glucose, Bld: 104 mg/dL — ABNORMAL HIGH (ref 65–99)
Potassium: 4.1 mmol/L (ref 3.5–5.3)
Sodium: 136 mmol/L (ref 135–146)
Total Bilirubin: 0.4 mg/dL (ref 0.2–1.2)
Total Protein: 7.3 g/dL (ref 6.1–8.1)
eGFR: 71 mL/min/{1.73_m2} (ref >=60)

## 2021-03-26 LAB — MAGNESIUM: Magnesium: 1.9 mg/dL (ref 1.5–2.5)

## 2021-03-26 LAB — LIPID PANEL
Cholesterol: 163 mg/dL (ref <200)
HDL: 58 mg/dL (ref >=50)
LDL Cholesterol (Calc): 83 mg/dL (calc)
Non-HDL Cholesterol (Calc): 105 mg/dL (calc) (ref <130)
Total CHOL/HDL Ratio: 2.8 (calc) (ref <5.0)
Triglycerides: 122 mg/dL (ref <150)

## 2021-03-26 LAB — EXTRA LAV TOP TUBE

## 2021-04-06 DIAGNOSIS — R55 Syncope and collapse: Secondary | ICD-10-CM | POA: Diagnosis not present

## 2021-04-06 DIAGNOSIS — Z8669 Personal history of other diseases of the nervous system and sense organs: Secondary | ICD-10-CM | POA: Diagnosis not present

## 2021-04-07 ENCOUNTER — Other Ambulatory Visit: Payer: Self-pay | Admitting: Family Medicine

## 2021-04-07 DIAGNOSIS — J45901 Unspecified asthma with (acute) exacerbation: Secondary | ICD-10-CM

## 2021-04-09 NOTE — Telephone Encounter (Signed)
Patient advised of MRI report. Voiced understanding

## 2021-04-28 ENCOUNTER — Ambulatory Visit: Payer: Medicare HMO | Admitting: Neurology

## 2021-05-04 ENCOUNTER — Other Ambulatory Visit: Payer: Self-pay | Admitting: Oncology

## 2021-05-12 ENCOUNTER — Telehealth: Payer: Self-pay | Admitting: Pharmacist

## 2021-05-12 NOTE — Progress Notes (Addendum)
    Chronic Care Management Pharmacy Assistant   Name: Krystal Brooks  MRN: [id number] DOB: 14-Apr-1956  Reason for Encounter: General Disease State Call    Conditions to be addressed/monitored: HTN, Breast cancer, HLD   Recent office visits:  03/03/21 (Video Visit) Dr. Dennard Schaumann For follow-up. STARTED Magnesium Oxide 400 mg daily and Potassium Chloride [facility] 10 MEQ Daily.   Recent consult visits:  03/24/21 03/10/21 Krystal Brooks, Krystal Brooks No information given.  03/18/21 (Telephone) Neurology Krystal Sprang, MD. STARTED Diazepam 5 mg 1 tablet 30 minutes prior to MRI.  03/16/21 Neurology Krystal Sprang, MD. For procedure visit No medication changes. 03/13/21 Neurology Krystal Sprang, MD. For initial visit. No medication changes.  03/10/21 Derm Krystal Brooks No information given.  02/28/21 [facility] Krystal Brooks, Krystal Pillow, NP  For acute Pansinusitis. STARTED Azithromycin 250 mg pack for 5 days and Prednisone 20 mg 2 tablets daily for 5 days .  02/25/21 Derm Krystal Brooks. Krystal B. No information given.  Hospital visits:  03/01/21 [facility] (2 Hours) For COVID-19. No medication changes.   Medications: Outpatient Encounter Medications as of 05/12/2021  Medication Sig   albuterol (PROVENTIL) (2.5 MG/3ML) 0.083% nebulizer solution TAKE 3 MLS (2.5 MG TOTAL) BY NEBULIZATION EVERY 6 HOURS AS NEEDED WHEEZING OR SHORTNESS OF BREATH.   albuterol (VENTOLIN HFA) 108 (90 Base) MCG/ACT inhaler INHALE 1-2 PUFFS EVERY 6 HOURS AS NEEDED FOR WHEEZING   Ascorbic Acid (VITAMIN C) 1000 MG tablet Take 1,000 mg by mouth 2 (two) times daily.    diazepam (VALIUM) 5 MG tablet Take 1 tablet 30 minutes prior to MRI. May take second dose if needed.   fluticasone (FLONASE) 50 MCG/ACT nasal spray Place 2 sprays into both nostrils daily. (Patient not taking: No sig reported)   losartan (COZAAR) 100 MG tablet TAKE 1 TABLET BY MOUTH EVERY DAY   Magnesium Oxide 400 MG CAPS Take 1 capsule (400 mg total) by mouth daily.   meclizine  (ANTIVERT) 25 MG tablet Take 1 tablet (25 mg total) by mouth 3 (three) times daily as needed for dizziness. (Patient not taking: Reported on 03/13/2021)   montelukast (SINGULAIR) 10 MG tablet TAKE 1 TABLET BY MOUTH EVERY DAY   Multiple Vitamin (MULTIVITAMIN WITH MINERALS) TABS tablet Take 1 tablet by mouth daily.   potassium chloride (KLOR-CON) 10 MEQ tablet Take 1 tablet (10 mEq total) by mouth daily.   SYMBICORT 80-4.5 MCG/ACT inhaler TAKE 2 PUFFS BY MOUTH TWICE A DAY   tamoxifen (NOLVADEX) 20 MG tablet TAKE 1 TABLET AT BEDTIME   triamterene-hydrochlorothiazide (MAXZIDE-25) 37.5-25 MG tablet TAKE 1 TABLET BY MOUTH AT BEDTIME. STOP AMLODIPINE   No facility-administered encounter medications on file as of 05/12/2021.   GEN CALL: Patient stated overall her activity level and appetite has been really good. She stated she has not had any concerned or questions about he regular routine medication at this time but she stated she was wondering if she still needs to take her Potassium and Magnesium medications or does she need to come to the office to get her labs redrawn, I sent a message to the nurse for clarification.   Care Gaps: Not on the list  Star Rating Drugs: Losartan 100 mg 04/26/21 30 DS.   Follow-Up:Pharmacist Review    Charlann Lange, RMA Clinical Pharmacist Assistant [phone]  7 minutes spent in review, coordination, and documentation.  Reviewed by: Beverly Milch, PharmD Clinical Pharmacist [phone]

## 2021-05-13 ENCOUNTER — Telehealth: Payer: Self-pay | Admitting: *Deleted

## 2021-05-13 ENCOUNTER — Other Ambulatory Visit: Payer: Self-pay | Admitting: *Deleted

## 2021-05-13 DIAGNOSIS — E876 Hypokalemia: Secondary | ICD-10-CM

## 2021-05-13 NOTE — Telephone Encounter (Signed)
Call placed to patient and patient made aware.   Medication list updated.   Future orders placed.

## 2021-05-13 NOTE — Telephone Encounter (Signed)
-----   Message from Susy Frizzle, MD sent at 05/13/2021  6:18 AM EDT ----- Regarding: RE: Medication and Labs Stop magnesium and potassium and recheck mag and potassium levels in 2 weeks off meds.  ----- Message ----- From: Sheral Flow, LPN Sent: [id number]   4:35 PM EDT To: Susy Frizzle, MD Subject: FW: Medication and Labs                        Patient labs are normal at this time.    Please advise.  ----- Message ----- From: Rosetta Posner Sent: 05/12/2021   2:18 PM EDT To: Eden Lathe Damante Spragg, LPN Subject: Medication and Labs                            Recently spoke with the patient about her medications and she is wondering if she needs to come back in to get her labs redrawn to see if she still needs to take her potassium and Magnesium pills, please advise ?

## 2021-05-20 ENCOUNTER — Other Ambulatory Visit: Payer: Medicare HMO

## 2021-05-20 ENCOUNTER — Other Ambulatory Visit: Payer: Self-pay

## 2021-05-20 DIAGNOSIS — E876 Hypokalemia: Secondary | ICD-10-CM | POA: Diagnosis not present

## 2021-05-21 LAB — BASIC METABOLIC PANEL
BUN: 18 mg/dL (ref 7–25)
CO2: 26 mmol/L (ref 20–32)
Calcium: 9.5 mg/dL (ref 8.6–10.4)
Chloride: 101 mmol/L (ref 98–110)
Creat: 1.04 mg/dL (ref 0.50–1.05)
Glucose, Bld: 145 mg/dL — ABNORMAL HIGH (ref 65–99)
Potassium: 3.9 mmol/L (ref 3.5–5.3)
Sodium: 138 mmol/L (ref 135–146)

## 2021-05-21 LAB — MAGNESIUM: Magnesium: 1.8 mg/dL (ref 1.5–2.5)

## 2021-05-25 ENCOUNTER — Encounter: Payer: Self-pay | Admitting: Family Medicine

## 2021-05-25 ENCOUNTER — Other Ambulatory Visit: Payer: Self-pay

## 2021-05-25 ENCOUNTER — Ambulatory Visit (INDEPENDENT_AMBULATORY_CARE_PROVIDER_SITE_OTHER): Payer: Medicare HMO | Admitting: Family Medicine

## 2021-05-25 DIAGNOSIS — E876 Hypokalemia: Secondary | ICD-10-CM

## 2021-05-25 NOTE — Progress Notes (Signed)
Subjective:    Patient ID: Krystal Brooks, female    DOB: 05-02-1956, 65 y.o.   MRN: [id number]  HPI In June, the patient suffered a seizure.  EEG was normal.  Seizure was attributed to multiple factors including low magnesium, low potassium, COVID, prednisone, etc.  She has been on potassium and magnesium and question how long she needed to take the supplements.  Her most recent lab work showed normal magnesium and potassium levels.  See below Appointment on 05/20/2021  Component Date Value Ref Range Status   Magnesium 05/20/2021 1.8  1.5 - 2.5 mg/dL Final   Glucose, Bld 05/20/2021 145 (A) 65 - 99 mg/dL Final   Comment: .            Fasting reference interval . For someone without known diabetes, a glucose value >125 mg/dL indicates that they may have diabetes and this should be confirmed with a follow-up test. .    BUN 05/20/2021 18  7 - 25 mg/dL Final   Creat 05/20/2021 1.04  0.50 - 1.05 mg/dL Final   BUN/Creatinine Ratio 74/03/1447 NOT APPLICABLE  6 - 22 (calc) Final   Sodium 05/20/2021 138  135 - 146 mmol/L Final   Potassium 05/20/2021 3.9  3.5 - 5.3 mmol/L Final   Chloride 05/20/2021 101  98 - 110 mmol/L Final   CO2 05/20/2021 26  20 - 32 mmol/L Final   Calcium 05/20/2021 9.5  8.6 - 10.4 mg/dL Final   Patient states that if she does not take the magnesium and the potassium she has leg cramps.  She likes to take the magnesium and the potassium.  It makes her feel more comfortable to take it.  I believe her potassium levels were low likely due to the triamterene/hydrochlorothiazide.  I am not certain why her magnesium levels were low unless it was due to her recent illness.  Therefore I suggested that she stop the potassium supplement and the magnesium and then recheck lab work in 2 weeks to see if she truly needs to keep taking the supplements.  She is here today to discuss. Past Medical History:  Diagnosis Date   Adie's pupil    LEFT EYE   Allergy    Asthma     Atherosclerosis of aorta (HCC)    bulge above renal arteries on mri 2016   Cancer ([facility]) 2017   right breast ca   Cellulitis    chest   Complication of anesthesia    VERY HARD TO WAKE UP     Eczema    Hypertension    Personal history of radiation therapy    PONV (postoperative nausea and vomiting)    Smoker    .psh Current Outpatient Medications on File Prior to Visit  Medication Sig Dispense Refill   albuterol (PROVENTIL) (2.5 MG/3ML) 0.083% nebulizer solution TAKE 3 MLS (2.5 MG TOTAL) BY NEBULIZATION EVERY 6 HOURS AS NEEDED WHEEZING OR SHORTNESS OF BREATH. 300 mL 5   albuterol (VENTOLIN HFA) 108 (90 Base) MCG/ACT inhaler INHALE 1-2 PUFFS EVERY 6 HOURS AS NEEDED FOR WHEEZING 18 each 5   Ascorbic Acid (VITAMIN C) 1000 MG tablet Take 1,000 mg by mouth 2 (two) times daily.      losartan (COZAAR) 100 MG tablet TAKE 1 TABLET BY MOUTH EVERY DAY 30 tablet 5   montelukast (SINGULAIR) 10 MG tablet TAKE 1 TABLET BY MOUTH EVERY DAY 30 tablet 11   Multiple Vitamin (MULTIVITAMIN WITH MINERALS) TABS tablet Take 1 tablet by mouth  daily.     SYMBICORT 80-4.5 MCG/ACT inhaler TAKE 2 PUFFS BY MOUTH TWICE A DAY 10.2 each 3   tamoxifen (NOLVADEX) 20 MG tablet TAKE 1 TABLET AT BEDTIME 30 tablet 14   triamterene-hydrochlorothiazide (MAXZIDE-25) 37.5-25 MG tablet TAKE 1 TABLET BY MOUTH AT BEDTIME. STOP AMLODIPINE 90 tablet 3   KLOR-CON M10 10 MEQ tablet Take 10 mEq by mouth daily. (Patient not taking: Reported on 05/25/2021)     magnesium oxide (MAG-OX) 400 (240 Mg) MG tablet Take 1 tablet by mouth daily. (Patient not taking: Reported on 05/25/2021)     No current facility-administered medications on file prior to visit.   Allergies  Allergen Reactions   Penicillins Swelling, Rash and Other (See Comments)    Older sister died from reaction to penicillin Has patient had a PCN reaction causing immediate rash, facial/tongue/throat swelling, SOB or lightheadedness with hypotension: Yes Has patient had a PCN  reaction causing severe rash involving mucus membranes or skin necrosis: No Has patient had a PCN reaction that required hospitalization: No Has patient had a PCN reaction occurring within the last 10 years: No If all of the above answers are "NO", then may proceed with Cephalosporin use.    Sulfa Antibiotics Rash   Levaquin [Levofloxacin] Nausea And Vomiting   Social History   Socioeconomic History   Marital status: Married    Spouse name: Not on file   Number of children: Not on file   Years of education: Not on file   Highest education level: Not on file  Occupational History   Not on file  Tobacco Use   Smoking status: Former    Packs/day: 0.50    Years: 40.00    Pack years: 20.00    Types: Cigarettes   Smokeless tobacco: Never  Vaping Use   Vaping Use: Never used  Substance and Sexual Activity   Alcohol use: Yes    Alcohol/week: 0.0 standard drinks    Comment: social   Drug use: No   Sexual activity: Yes    Birth control/protection: Surgical    Comment: hysterectomy  Other Topics Concern   Not on file  Social History Narrative   Three story home   Drinks caffeine   Right handed   Social Determinants of Health   Financial Resource Strain: Low Risk    Difficulty of Paying Living Expenses: Not hard at all  Food Insecurity: No Food Insecurity   Worried About Charity fundraiser in the Last Year: Never true   Ran Out of Food in the Last Year: Never true  Transportation Needs: No Transportation Needs   Lack of Transportation (Medical): No   Lack of Transportation (Non-Medical): No  Physical Activity: Inactive   Days of Exercise per Week: 0 days   Minutes of Exercise per Session: 0 min  Stress: No Stress Concern Present   Feeling of Stress : Not at all  Social Connections: Moderately Integrated   Frequency of Communication with Friends and Family: More than three times a week   Frequency of Social Gatherings with Friends and Family: More than three times a  week   Attends Religious Services: More than 4 times per year   Active Member of [employer] or Organizations: No   Attends Archivist Meetings: Never   Marital Status: Married  Human resources officer Violence: Not At Risk   Fear of Current or Ex-Partner: No   Emotionally Abused: No   Physically Abused: No   Sexually Abused: No  Review of Systems  All other systems reviewed and are negative.     Objective:   Physical Exam Vitals reviewed.  Constitutional:      Appearance: Normal appearance.  Cardiovascular:     Rate and Rhythm: Normal rate and regular rhythm.     Heart sounds: Normal heart sounds.  Pulmonary:     Effort: Pulmonary effort is normal.     Breath sounds: Normal breath sounds.  Neurological:     Mental Status: She is alert.          Assessment & Plan:  Hypomagnesemia  Hypokalemia Ultimately, the patient feels more comfortable staying on the magnesium and the potassium.  She has 2 options.  She could stop the magnesium and potassium and then recheck lab work in 2 weeks to see if the levels drop to indicate whether she truly needs to continue the medication.  The other option is she continue to do what she is currently doing and monitor levels every 6 months.  She prefers option #2.

## 2021-06-04 ENCOUNTER — Other Ambulatory Visit: Payer: Medicare HMO

## 2021-06-04 ENCOUNTER — Ambulatory Visit: Payer: Medicare HMO | Admitting: Oncology

## 2021-06-28 ENCOUNTER — Other Ambulatory Visit: Payer: Self-pay | Admitting: Family Medicine

## 2021-07-07 ENCOUNTER — Other Ambulatory Visit: Payer: Self-pay

## 2021-07-07 DIAGNOSIS — C50212 Malignant neoplasm of upper-inner quadrant of left female breast: Secondary | ICD-10-CM

## 2021-07-07 DIAGNOSIS — Z170 Estrogen receptor positive status [ER+]: Secondary | ICD-10-CM

## 2021-07-07 NOTE — Progress Notes (Signed)
[place]  [phone] [phone]     ID: Krystal Brooks DOB: 1955-12-18  MR#: [id number]  KXF#:[id number]  Patient Care Team: Susy Frizzle, MD as PCP - General (Family Medicine) Excell Seltzer, MD (Inactive) as Consulting Physician (General Surgery) Druscilla Brownie, MD as Consulting Physician (Dermatology) Dillingham, [name], DO as Attending Physician (Plastic Surgery) Jiovanny Burdell, Virgie Dad, MD as Consulting Physician (Oncology) Delice Bison, Charlestine Massed, NP as Nurse Practitioner (Hematology and Oncology) Edythe Clarity, [facility] as Pharmacist (Pharmacist) OTHER MD:  CHIEF COMPLAINT: Recurrent estrogen receptor positive breast cancer (s/p bilateral mastectomies)  CURRENT TREATMENT: Tamoxifen, total 10 years plan   INTERVAL HISTORY: [name] returns today for follow-up of her estrogen receptor positive breast cancer.   She continues on tamoxifen.  She tolerates this with no side effects that she is aware of and in particular hot flashes and vaginal wetness are not major concerns  Since her last visit, she tested positive for Covid-19 on 03/01/2021.  This was associated with 2 episodes of fainting which took her to the emergency room.  Noncontrast head CT 03/01/2021 showed some sinusitis.  She was found to be hypomagnesemic and hypokalemic as well as SARS coronavirus 2 positive.  She was referred to neurology and evaluated by Dr. Delice Lesch with no specific pathology identified   REVIEW OF SYSTEMS: Neosha continues to have some chest wall discomfort.  She wonders if reconstruction would be helpful.  She tells me she is not a candidate for D IEP since she had prior pelvic surgeries.  Aside from that a detailed review of systems today was noncontributory   COVID 19 VACCINATION STATUS: Pfizer x3, last 05/2020; infection 02/2021   BREAST CANCER HISTORY: From Dr Ernestina Penna 01/14/16 note:   "Krystal Brooks 66 y.o. female is here because of Her newly diagnosed  right breast cancer. She is accompanied by her husband and of friend to our multidisciplinary rest clinic today.   This was found by screening mammogram,  she denies any palpable mass, skin change or nipple discharge, or ulcer constitutional symptoms before the screening.  Her prior mammo 6 years ago. She had a right breast cyst removed in 1979. "   Malignant neoplasm of lower-inner quadrant of right breast of female, estrogen receptor positive ([place])    12/29/2015 Mammogram      Diagnostic right mammogram showed a 2.3 x 2.1 x 2.2 cm group of coarse calcification within the medial slightly lower right breast.         01/02/2016 Initial Diagnosis      Breast cancer of lower-inner quadrant of right female breast ([name])         01/02/2016 Initial Biopsy      right breast LIQ core needle biopsy showed LCIS, with a small focus of invasive lobular carcinoma, grade 2.          01/02/2016 Receptors her2      ER 10% positive, moderate staining. PR negative. Insufficient tissue for HER-2 testing.         01/27/2016 Surgery      Right breast lumpectomy and sentinel lymph node biopsy ([place])         01/27/2016 Pathology Results      Right breast lumpectomy showed pleomorphic variant of lobular carcinoma in situ, and scattered microscopic foci of invasive lobular carcinoma (all less than 0.1 cm) in different sections, total 0.6 cm. 0/2 SLN.          01/27/2016 Receptors her2  The surgical sample of the invasive carcinoma was not sufficient for Her2 test and other additional studies.         03/09/2016 - 04/19/2016 Radiation Therapy      Adjuvant breast radiation [name]). Right breast: 50 Gy in 25 fractions. Right breast "boost": 10 Gy in 5 fractions.           04/2016 -  Anti-estrogen oral therapy      Anastrozole 1 mg daily. Planned duration of therapy: 5 years        PAST MEDICAL HISTORY: Past Medical History:  Diagnosis Date   Adie's pupil    LEFT EYE   Allergy    Asthma     Atherosclerosis of aorta (HCC)    bulge above renal arteries on mri 2016   Cancer ([place]) 2017   right breast ca   Cellulitis    chest   Complication of anesthesia    VERY HARD TO WAKE UP     Eczema    Hypertension    Personal history of radiation therapy    PONV (postoperative nausea and vomiting)    Smoker     PAST SURGICAL HISTORY: Past Surgical History:  Procedure Laterality Date   ABDOMINAL HYSTERECTOMY     BREAST BIOPSY Right 01/02/2016   BREAST EXCISIONAL BIOPSY Right 1979   BREAST IMPLANT REMOVAL Bilateral 10/05/2017   Procedure: REMOVALOF BILATERAL BREAST IMPLANTS;  Surgeon: Wallace Going, DO;  Location: [place];  Service: Plastics;  Laterality: Bilateral;   BREAST LUMPECTOMY Right 01/27/2016   BREAST LUMPECTOMY WITH RADIOACTIVE SEED AND SENTINEL LYMPH NODE BIOPSY Right 01/27/2016   Procedure: BREAST LUMPECTOMY WITH RADIOACTIVE SEED AND SENTINEL LYMPH NODE BIOPSY;  Surgeon: Excell Seltzer, MD;  Location: [place];  Service: General;  Laterality: Right;   BREAST RECONSTRUCTION WITH PLACEMENT OF TISSUE EXPANDER AND FLEX HD (ACELLULAR HYDRATED DERMIS) Bilateral 03/16/2017   Procedure: BILATERAL IMMEDIATE BREAST RECONSTRUCTION WITH PLACEMENT OF TISSUE EXPANDER AND FLEX HD (ACELLULAR HYDRATED DERMIS);  Surgeon: Wallace Going, DO;  Location: [place];  Service: Plastics;  Laterality: Bilateral;   BREAST SURGERY  1979   cyst removed rt breast   CATARACT EXTRACTION W/ INTRAOCULAR LENS IMPLANT     RT EYE   CESAREAN SECTION     x2   EXCISION OF BREAST LESION Left 01/30/2018   Procedure: EXCISION OF LEFT BREAST SCAR;  Surgeon: Wallace Going, DO;  Location: [place];  Service: Plastics;  Laterality: Left;   LATISSIMUS FLAP TO BREAST Right 01/30/2018   Procedure: RIGHT LATISSIMUS MYOCUTANEOUS FLAP FOR RIGHT CHEST WALL RECONSTRUCTION;  Surgeon: Wallace Going, DO;  Location: [place];  Service: Plastics;  Laterality: Right;   MASTECTOMY  W/ SENTINEL NODE BIOPSY Bilateral 03/16/2017   Procedure: BILATERAL TOTAL MASTECTOMY WITH LEFT SENTINEL LYMPH NODE BIOPSY;  Surgeon: Excell Seltzer, MD;  Location: [place];  Service: General;  Laterality: Bilateral;   REMOVAL OF BILATERAL TISSUE EXPANDERS WITH PLACEMENT OF BILATERAL BREAST IMPLANTS Bilateral 06/15/2017   Procedure: REMOVAL OF BILATERAL TISSUE EXPANDERS WITH PLACEMENT OF BILATERAL BREAST IMPLANTS;  Surgeon: Wallace Going, DO;  Location: [place];  Service: Plastics;  Laterality: Bilateral;   TOTAL MASTECTOMY Bilateral 03/16/2017   SENTINAL NODE BIOPSY    FAMILY HISTORY Family History  Problem Relation Age of Onset   Hearing loss Mother    Asthma Father    Cancer Father        melanoma, metastases    Diabetes Father  Heart disease Father    Early death Sister    Early death Paternal Grandfather    Cancer Maternal Grandmother 88       colon cancer    Breast cancer Neg Hx    Ovarian cancer Neg Hx   The patient's father died at age 59. He had been diagnosed with melanoma in 1 year. That was not the cause of death. The patient's mother is living at age 58 as of May 2018. The patient has one brother, no sisters. There is no history of breast or ovarian cancer in the family, and no other melanoma history.   GYNECOLOGIC HISTORY:  No LMP recorded. Patient has had a hysterectomy. 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 She frequently keeps her 2 grandchildren. Her husband Clair Gulling is a retired Airline pilot. He now owns a business where they prepare the connection between the front living in/RV portion and a horse trailer: They do the hitch combination. Daughter April lives in [place] where she works as a Statistician. Daughter Ailene Ravel lives in  [place] where she is an Forensic scientist for [employer]. The patient has 4 grandchildren. She is a Tourist information centre manager.    ADVANCED DIRECTIVES: The patient's husband is automatically her healthcare power of attorney   HEALTH MAINTENANCE: Social History   Tobacco Use   Smoking status: Former    Packs/day: 0.50    Years: 40.00    Pack years: 20.00    Types: Cigarettes   Smokeless tobacco: Never  Vaping Use   Vaping Use: Never used  Substance Use Topics   Alcohol use: Yes    Alcohol/week: 0.0 standard drinks    Comment: social   Drug use: No     Colonoscopy:  PAP:  Bone density:05/24/2016 showed a T score of -0.6 normal   Allergies  Allergen Reactions   Penicillins Swelling, Rash and Other (See Comments)    Older sister died from reaction to penicillin Has patient had a PCN reaction causing immediate rash, facial/tongue/throat swelling, SOB or lightheadedness with hypotension: Yes Has patient had a PCN reaction causing severe rash involving mucus membranes or skin necrosis: No Has patient had a PCN reaction that required hospitalization: No Has patient had a PCN reaction occurring within the last 10 years: No If all of the above answers are "NO", then may proceed with Cephalosporin use.    Sulfa Antibiotics Rash   Levaquin [Levofloxacin] Nausea And Vomiting    Current Outpatient Medications  Medication Sig Dispense Refill   albuterol (PROVENTIL) (2.5 MG/3ML) 0.083% nebulizer solution TAKE 3 MLS (2.5 MG TOTAL) BY NEBULIZATION EVERY 6 HOURS AS NEEDED WHEEZING OR SHORTNESS OF BREATH. 300 mL 5   albuterol (VENTOLIN HFA) 108 (90 Base) MCG/ACT inhaler INHALE 1-2 PUFFS EVERY 6 HOURS AS NEEDED FOR WHEEZING 18 each 5   Ascorbic Acid (VITAMIN C) 1000 MG tablet Take 1,000 mg by mouth 2 (two) times daily.      KLOR-CON M10 10 MEQ tablet TAKE 1 TABLET BY MOUTH EVERY DAY 30 tablet 1   losartan (COZAAR) 100 MG tablet TAKE 1 TABLET BY MOUTH EVERY DAY 30 tablet 5   magnesium oxide  (MAG-OX) 400 (240 Mg) MG tablet Take 1 tablet by mouth daily. (Patient not taking: Reported on 05/25/2021)  montelukast (SINGULAIR) 10 MG tablet TAKE 1 TABLET BY MOUTH EVERY DAY 30 tablet 11   Multiple Vitamin (MULTIVITAMIN WITH MINERALS) TABS tablet Take 1 tablet by mouth daily.     SYMBICORT 80-4.5 MCG/ACT inhaler TAKE 2 PUFFS BY MOUTH TWICE A DAY 10.2 each 3   tamoxifen (NOLVADEX) 20 MG tablet Take 1 tablet (20 mg total) by mouth at bedtime. 90 tablet 4   triamterene-hydrochlorothiazide (MAXZIDE-25) 37.5-25 MG tablet TAKE 1 TABLET BY MOUTH AT BEDTIME. STOP AMLODIPINE 90 tablet 3   No current facility-administered medications for this visit.    OBJECTIVE: White woman in no acute distress  Vitals:   07/08/21 1228  BP: (!) 154/70  Pulse: 90  Resp: 18  Temp: 97.7 F (36.5 C)  SpO2: 95%      Body mass index is 23.34 kg/m.    ECOG FS:1 - Symptomatic but completely ambulatory  Sclerae unicteric, EOMs intact Wearing a mask No cervical or supraclavicular adenopathy Lungs no rales or rhonchi Heart regular rate and rhythm Abd soft, nontender, positive bowel sounds MSK no focal spinal tenderness, no upper extremity lymphedema Neuro: nonfocal, well oriented, appropriate affect Breasts: Status post bilateral mastectomies.  There is no evidence of chest wall recurrence.  Both axillae are benign.   LAB RESULTS:  CMP     Component Value Date/Time   NA 135 07/08/2021 1139   NA 137 07/13/2017 1035   K 3.4 (L) 07/08/2021 1139   K 4.3 07/13/2017 1035   CL 100 07/08/2021 1139   CO2 25 07/08/2021 1139   CO2 27 07/13/2017 1035   GLUCOSE 116 (H) 07/08/2021 1139   GLUCOSE 94 07/13/2017 1035   BUN 19 07/08/2021 1139   BUN 15.4 07/13/2017 1035   CREATININE 0.96 07/08/2021 1139   CREATININE 1.04 05/20/2021 0901   CREATININE 0.8 07/13/2017 1035   CALCIUM 9.2 07/08/2021 1139   CALCIUM 9.3 07/13/2017 1035   PROT 7.6 07/08/2021 1139   PROT 7.7 07/13/2017 1035   ALBUMIN 3.9 07/08/2021  1139   ALBUMIN 3.5 07/13/2017 1035   AST 22 07/08/2021 1139   AST 15 07/13/2017 1035   ALT 20 07/08/2021 1139   ALT 14 07/13/2017 1035   ALKPHOS 48 07/08/2021 1139   ALKPHOS 73 07/13/2017 1035   BILITOT 0.3 07/08/2021 1139   BILITOT 0.22 07/13/2017 1035   GFRNONAA >60 07/08/2021 1139   GFRNONAA 64 12/15/2017 1541   GFRAA >60 06/06/2019 1102   GFRAA 50 (L) 06/20/2018 0930   GFRAA 74 12/15/2017 1541    No results found for: TOTALPROTELP, ALBUMINELP, A1GS, A2GS, BETS, BETA2SER, GAMS, MSPIKE, SPEI  No results found for: KPAFRELGTCHN, LAMBDASER, [facility]  Lab Results  Component Value Date   WBC 7.2 07/08/2021   NEUTROABS 4.7 07/08/2021   HGB 12.4 07/08/2021   HCT 38.0 07/08/2021   MCV 90.5 07/08/2021   PLT 157 07/08/2021      Chemistry      Component Value Date/Time   NA 135 07/08/2021 1139   NA 137 07/13/2017 1035   K 3.4 (L) 07/08/2021 1139   K 4.3 07/13/2017 1035   CL 100 07/08/2021 1139   CO2 25 07/08/2021 1139   CO2 27 07/13/2017 1035   BUN 19 07/08/2021 1139   BUN 15.4 07/13/2017 1035   CREATININE 0.96 07/08/2021 1139   CREATININE 1.04 05/20/2021 0901   CREATININE 0.8 07/13/2017 1035      Component Value Date/Time   CALCIUM 9.2 07/08/2021 1139   CALCIUM 9.3 07/13/2017 1035  ALKPHOS 48 07/08/2021 1139   ALKPHOS 73 07/13/2017 1035   AST 22 07/08/2021 1139   AST 15 07/13/2017 1035   ALT 20 07/08/2021 1139   ALT 14 07/13/2017 1035   BILITOT 0.3 07/08/2021 1139   BILITOT 0.22 07/13/2017 1035       No results found for: LABCA2  No components found for: [id number]  No results for input(s): INR in the last 168 hours.  Urinalysis No results found for: COLORURINE, APPEARANCEUR, LABSPEC, PHURINE, GLUCOSEU, HGBUR, BILIRUBINUR, KETONESUR, PROTEINUR, UROBILINOGEN, NITRITE, LEUKOCYTESUR   STUDIES: No results found.   ELIGIBLE FOR AVAILABLE RESEARCH PROTOCOL: no  ASSESSMENT: 65 y.o. [place] woman, with bilateral breast cancer   (1) status post  right lumpectomy and sentinel lymph node sampling 01/27/2016 for lobular carcinoma in situ, pleomorphic variant, with multiple foci of invasive lobular carcinoma, all less than a millimeter, but with negative margins and both sentinel lymph nodes negative, invasive disease being estrogen receptor positive, HER-2 not tested (pT1 [mic] pN0}   (2) adjuvant radiation 03/09/2016 through 04/19/2016  The patient initially received a dose of 50 Gy in 25 fractions to the breast using whole-breast tangent fields. This was delivered using a 3-D conformal technique. The patient then received a boost to the seroma. This delivered an additional 10 Gy in 5 fractions using an en face electron field due to the depth of the seroma. The total dose was 60 Gy.   (3) anastrozole started June 2017, discontinued July 2018   (4) status post left breast lower inner quadrant biopsy 12/29/2016 for a clinical T1c N0  invasive lobular breast cancer, grade 2, estrogen receptor positive, progesterone receptor negative HER-2 nonamplified, with a signals ratio 1.35 and the number per cell 2.70.  (5) intermediate Oncotype score of 22 predicts a 10 year risk of recurrence outside the breast of 14% if the patient's only systemic therapy is tamoxifen for 5 years.  (a) the patient opted against chemotherapy Lovena Le Rx results confirm choice)  (6) status post bilateral mastectomies and left sentinel lymph node sampling 03/16/2017 showing  (a) on the right, no evidence of carcinoma  (b) on the left, a pT2 pN0, stage IIA invasive lobular carcinoma, grade 2, with negative margins.  (7) had immediate expander placement at the time of bilateral mastectomies  (a) definitive implant placement with bilateral capsulotomies June 15, 2017, with benign pathology  (b) implants removed February 2019 because of chest discomfort  (c) status post right latissimus flap reconstruction and left capsulotomy 01/30/2018 with benign pathology  (d) decided  to have all implants removed with no further reconstruction.  (Per Dr. Dalbert Mayotte note 03/06/2019).  (8) started tamoxifen 04/30/2017-- plan for total 10 years of antiestrogens given lobular histology   PLAN: Fatimah is now a little over 4 years out from definitive surgery for her breast cancer with no evidence of disease recurrence.  This is very favorable.  She is into her fifth year of antiestrogens.  With lobular breast cancer however my practice is to continue tamoxifen specifically up to 10 years since these cancers can be very slow-growing and can recur late.  She is very much in agreement with this plan and I refilled her prescription today.  I think she may benefit from physical therapy for her chest wall discomfort and I had placed that referral as well.  I am setting her up for a booster shot within the next few days.  Otherwise she will return to see [place] in 1 year for routine follow-up  Total encounter time 25 minutes.*    Keene Gilkey, Virgie Dad, MD  07/08/21 2:47 PM Medical Oncology and Hematology [facility] [address], [place] [address] Tel. [phone]    Fax. [phone]    I, Wilburn Mylar am acting as scribe for Dr. Virgie Dad. Julus Kelley.  I, Lurline Del MD, have reviewed the above documentation for accuracy and completeness, and I agree with the above.   *Total Encounter Time as defined by the Centers for Medicare and Medicaid Services includes, in addition to the face-to-face time of a patient visit (documented in the note above) non-face-to-face time: obtaining and reviewing outside history, ordering and reviewing medications, tests or procedures, care coordination (communications with other health care professionals or caregivers) and documentation in the medical record.

## 2021-07-08 ENCOUNTER — Inpatient Hospital Stay: Payer: Medicare HMO | Attending: Oncology

## 2021-07-08 ENCOUNTER — Inpatient Hospital Stay: Payer: Medicare HMO | Admitting: Oncology

## 2021-07-08 ENCOUNTER — Other Ambulatory Visit: Payer: Self-pay

## 2021-07-08 VITALS — BP 154/70 | HR 90 | Temp 97.7°F | Resp 18 | Ht 61.0 in | Wt 123.5 lb

## 2021-07-08 DIAGNOSIS — Z170 Estrogen receptor positive status [ER+]: Secondary | ICD-10-CM | POA: Insufficient documentation

## 2021-07-08 DIAGNOSIS — Z9221 Personal history of antineoplastic chemotherapy: Secondary | ICD-10-CM | POA: Insufficient documentation

## 2021-07-08 DIAGNOSIS — C50212 Malignant neoplasm of upper-inner quadrant of left female breast: Secondary | ICD-10-CM

## 2021-07-08 DIAGNOSIS — Z87891 Personal history of nicotine dependence: Secondary | ICD-10-CM | POA: Insufficient documentation

## 2021-07-08 DIAGNOSIS — Z79810 Long term (current) use of selective estrogen receptor modulators (SERMs): Secondary | ICD-10-CM | POA: Diagnosis not present

## 2021-07-08 DIAGNOSIS — Z79899 Other long term (current) drug therapy: Secondary | ICD-10-CM | POA: Insufficient documentation

## 2021-07-08 DIAGNOSIS — C50311 Malignant neoplasm of lower-inner quadrant of right female breast: Secondary | ICD-10-CM

## 2021-07-08 DIAGNOSIS — Z9013 Acquired absence of bilateral breasts and nipples: Secondary | ICD-10-CM | POA: Insufficient documentation

## 2021-07-08 DIAGNOSIS — Z923 Personal history of irradiation: Secondary | ICD-10-CM | POA: Insufficient documentation

## 2021-07-08 DIAGNOSIS — C50312 Malignant neoplasm of lower-inner quadrant of left female breast: Secondary | ICD-10-CM | POA: Diagnosis not present

## 2021-07-08 LAB — CMP (CANCER CENTER ONLY)
ALT: 20 U/L (ref 0–44)
AST: 22 U/L (ref 15–41)
Albumin: 3.9 g/dL (ref 3.5–5.0)
Alkaline Phosphatase: 48 U/L (ref 38–126)
Anion gap: 10 (ref 5–15)
BUN: 19 mg/dL (ref 8–23)
CO2: 25 mmol/L (ref 22–32)
Calcium: 9.2 mg/dL (ref 8.9–10.3)
Chloride: 100 mmol/L (ref 98–111)
Creatinine: 0.96 mg/dL (ref 0.44–1.00)
GFR, Estimated: >60 mL/min (ref >60)
Glucose, Bld: 116 mg/dL — ABNORMAL HIGH (ref 70–99)
Potassium: 3.4 mmol/L — ABNORMAL LOW (ref 3.5–5.1)
Sodium: 135 mmol/L (ref 135–145)
Total Bilirubin: 0.3 mg/dL (ref 0.3–1.2)
Total Protein: 7.6 g/dL (ref 6.5–8.1)

## 2021-07-08 LAB — CBC WITH DIFFERENTIAL (CANCER CENTER ONLY)
Abs Immature Granulocytes: 0.02 10*3/uL (ref 0.00–0.07)
Basophils Absolute: 0 10*3/uL (ref 0.0–0.1)
Basophils Relative: 0 %
Eosinophils Absolute: 0.1 10*3/uL (ref 0.0–0.5)
Eosinophils Relative: 1 %
HCT: 38 % (ref 36.0–46.0)
Hemoglobin: 12.4 g/dL (ref 12.0–15.0)
Immature Granulocytes: 0 %
Lymphocytes Relative: 24 %
Lymphs Abs: 1.7 10*3/uL (ref 0.7–4.0)
MCH: 29.5 pg (ref 26.0–34.0)
MCHC: 32.6 g/dL (ref 30.0–36.0)
MCV: 90.5 fL (ref 80.0–100.0)
Monocytes Absolute: 0.6 10*3/uL (ref 0.1–1.0)
Monocytes Relative: 8 %
Neutro Abs: 4.7 10*3/uL (ref 1.7–7.7)
Neutrophils Relative %: 67 %
Platelet Count: 157 10*3/uL (ref 150–400)
RBC: 4.2 MIL/uL (ref 3.87–5.11)
RDW: 12.6 % (ref 11.5–15.5)
WBC Count: 7.2 10*3/uL (ref 4.0–10.5)
nRBC: 0 % (ref 0.0–0.2)

## 2021-07-08 MED ORDER — TAMOXIFEN CITRATE 20 MG PO TABS: 20.0000 mg | ORAL_TABLET | Freq: Every day | ORAL | 4 refills | Status: DC

## 2021-07-15 ENCOUNTER — Inpatient Hospital Stay (HOSPITAL_BASED_OUTPATIENT_CLINIC_OR_DEPARTMENT_OTHER): Payer: Medicare HMO

## 2021-07-15 ENCOUNTER — Other Ambulatory Visit: Payer: Self-pay

## 2021-07-15 DIAGNOSIS — Z23 Encounter for immunization: Secondary | ICD-10-CM | POA: Diagnosis not present

## 2021-07-15 NOTE — Progress Notes (Signed)
   Covid-19 Vaccination Clinic  Name:  Krystal Brooks    MRN: [id number] DOB: 09/01/1955  07/15/2021  Krystal Brooks was observed post Covid-19 immunization for 15 minutes without incident. She was provided with Vaccine Information Sheet and instruction to access the V-Safe system.   Krystal Brooks was instructed to call 911 with any severe reactions post vaccine: Difficulty breathing  Swelling of face and throat  A fast heartbeat  A bad rash all over body  Dizziness and weakness   Immunizations Administered     Name Date Dose VIS Date Route   PFIZER Comrnaty(Gray TOP) Covid-19 Vaccine 07/15/2021 10:49 AM 0.3 mL 04/29/2021 Intramuscular   Manufacturer: [employer]   Lot: [id number]   [place]: [phone]

## 2021-07-29 ENCOUNTER — Ambulatory Visit: Payer: Medicare HMO | Attending: Oncology | Admitting: Rehabilitation

## 2021-07-29 ENCOUNTER — Encounter: Payer: Self-pay | Admitting: Rehabilitation

## 2021-07-29 ENCOUNTER — Other Ambulatory Visit: Payer: Self-pay

## 2021-07-29 DIAGNOSIS — L599 Disorder of the skin and subcutaneous tissue related to radiation, unspecified: Secondary | ICD-10-CM | POA: Insufficient documentation

## 2021-07-29 DIAGNOSIS — C50212 Malignant neoplasm of upper-inner quadrant of left female breast: Secondary | ICD-10-CM | POA: Diagnosis not present

## 2021-07-29 DIAGNOSIS — C50311 Malignant neoplasm of lower-inner quadrant of right female breast: Secondary | ICD-10-CM | POA: Insufficient documentation

## 2021-07-29 DIAGNOSIS — Z170 Estrogen receptor positive status [ER+]: Secondary | ICD-10-CM | POA: Diagnosis not present

## 2021-07-29 NOTE — Therapy (Signed)
[facility] @ [address] [place], [place], [address] Phone: [phone]   Fax:  [phone]  Physical Therapy Evaluation  Patient Details  Name: Krystal Brooks MRN: [id number] Date of Birth: 1955-11-24 Referring Provider (PT): Dr. Jana Hakim   Encounter Date: 07/29/2021   PT End of Session - 07/29/21 1147     Visit Number 1    Number of Visits 9    Date for PT Re-Evaluation 09/23/21    Authorization Type MCR    Progress Note Due on Visit 10    PT Start Time 1100    PT Stop Time 1145    PT Time Calculation (min) 45 min    Activity Tolerance Patient tolerated treatment well    Behavior During Therapy [facility] for tasks assessed/performed             Past Medical History:  Diagnosis Date   Adie's pupil    LEFT EYE   Allergy    Asthma    Atherosclerosis of aorta (HCC)    bulge above renal arteries on mri 2016   Cancer ([place]) 2017   right breast ca   Cellulitis    chest   Complication of anesthesia    VERY HARD TO WAKE UP     Eczema    Hypertension    Personal history of radiation therapy    PONV (postoperative nausea and vomiting)    Smoker     Past Surgical History:  Procedure Laterality Date   ABDOMINAL HYSTERECTOMY     BREAST BIOPSY Right 01/02/2016   BREAST EXCISIONAL BIOPSY Right 1979   BREAST IMPLANT REMOVAL Bilateral 10/05/2017   Procedure: REMOVALOF BILATERAL BREAST IMPLANTS;  Surgeon: Wallace Going, DO;  Location: [facility];  Service: Plastics;  Laterality: Bilateral;   BREAST LUMPECTOMY Right 01/27/2016   BREAST LUMPECTOMY WITH RADIOACTIVE SEED AND SENTINEL LYMPH NODE BIOPSY Right 01/27/2016   Procedure: BREAST LUMPECTOMY WITH RADIOACTIVE SEED AND SENTINEL LYMPH NODE BIOPSY;  Surgeon: Excell Seltzer, MD;  Location: [facility];  Service: General;  Laterality: Right;   BREAST RECONSTRUCTION WITH PLACEMENT OF TISSUE EXPANDER AND FLEX HD (ACELLULAR HYDRATED DERMIS)  Bilateral 03/16/2017   Procedure: BILATERAL IMMEDIATE BREAST RECONSTRUCTION WITH PLACEMENT OF TISSUE EXPANDER AND FLEX HD (ACELLULAR HYDRATED DERMIS);  Surgeon: Wallace Going, DO;  Location: [facility];  Service: Plastics;  Laterality: Bilateral;   BREAST SURGERY  1979   cyst removed rt breast   CATARACT EXTRACTION W/ INTRAOCULAR LENS IMPLANT     RT EYE   CESAREAN SECTION     x2   EXCISION OF BREAST LESION Left 01/30/2018   Procedure: EXCISION OF LEFT BREAST SCAR;  Surgeon: Wallace Going, DO;  Location: [facility];  Service: Plastics;  Laterality: Left;   LATISSIMUS FLAP TO BREAST Right 01/30/2018   Procedure: RIGHT LATISSIMUS MYOCUTANEOUS FLAP FOR RIGHT CHEST WALL RECONSTRUCTION;  Surgeon: Wallace Going, DO;  Location: [facility];  Service: Plastics;  Laterality: Right;   MASTECTOMY W/ SENTINEL NODE BIOPSY Bilateral 03/16/2017   Procedure: BILATERAL TOTAL MASTECTOMY WITH LEFT SENTINEL LYMPH NODE BIOPSY;  Surgeon: Excell Seltzer, MD;  Location: [facility];  Service: General;  Laterality: Bilateral;   REMOVAL OF BILATERAL TISSUE EXPANDERS WITH PLACEMENT OF BILATERAL BREAST IMPLANTS Bilateral 06/15/2017   Procedure: REMOVAL OF BILATERAL TISSUE EXPANDERS WITH PLACEMENT OF BILATERAL BREAST IMPLANTS;  Surgeon: Wallace Going, DO;  Location: [facility];  Service: Plastics;  Laterality: Bilateral;  TOTAL MASTECTOMY Bilateral 03/16/2017   SENTINAL NODE BIOPSY    There were no vitals filed for this visit.    Subjective Assessment - 07/29/21 1056     Subjective I have just so much tightness in the Rt armpit and across the chest since after surgery.  Therapy helped that then with covid starting I stopped.  I also feel maybe some weakness in the shoulder because of the tightness    Pertinent History hx of bilateral breast cancer; bilateral mastectomy with implants 06/15/17 with removal of implants in February of 2019.  Radiation to the Rt breast.    Patient Stated Goals not feel  as tight    Currently in Pain? Yes    Pain Score 5     Pain Location Chest    Pain Orientation Right    Pain Descriptors / Indicators Tightness    Pain Type Chronic pain    Pain Onset More than a month ago    Pain Frequency Constant    Aggravating Factors  changes in pressure, busy day, using the arm alot,    Pain Relieving Factors I can stretch a little                [facility] PT Assessment - 07/29/21 0001       Assessment   Medical Diagnosis bil mastectomy due to breast cancer    Referring Provider (PT) Dr. Jana Hakim    Onset Date/Surgical Date 01/30/18    Hand Dominance Right    Prior Therapy yes here      Precautions   Precaution Comments cancer and lymphedema bil      Restrictions   Weight Bearing Restrictions No      Balance Screen   Has the patient fallen in the past 6 months No    Has the patient had a decrease in activity level because of a fear of falling?  No    Is the patient reluctant to leave their home because of a fear of falling?  No      Home Ecologist residence    Living Arrangements Spouse/significant other    Available Help at Discharge Family    Type of [place]      Prior Function   Level of Independence Independent    Leisure walking - never started the strength ABC and is not stretching      Cognition   Overall Cognitive Status Within Functional Limits for tasks assessed      Sensation   Additional Comments no numbness      Coordination   Gross Motor Movements are Fluid and Coordinated Yes      Posture/Postural Control   Posture/Postural Control Postural limitations    Postural Limitations Rounded Shoulders      AROM   Right Shoulder Extension 55 Degrees    Right Shoulder Flexion 145 Degrees   pull in axilla   Right Shoulder ABduction 135 Degrees   pectoralis tigthntess   Right Shoulder Horizontal ABduction 12 Degrees   pectoralis tightness     Strength   Right Shoulder Flexion 4/5    Right  Shoulder ABduction 4/5    Right Shoulder Internal Rotation 4/5    Right Shoulder External Rotation 4-/5      Palpation   Palpation comment tightness in pectoralis especially with arm above 90degrees in over head position, less skin movement over the ribs over medial incision  Objective measurements completed on examination: See above findings.       [facility] Adult PT Treatment/Exercise - 07/29/21 0001       Exercises   Other Exercises  updated HEP to focus on LTR wit hgoal post arms, seated mermaid stretch, bil ER with yellow band, and supine chest stretch      Manual Therapy   Manual Therapy Passive ROM    Soft tissue mobilization to the pectoralis, anterior chest, and latissimus in netural and slightly propped overhead as tolerated    Passive ROM to the Rt UE                     PT Education - 07/29/21 1147     Education Details POC, updated HEP    Person(s) Educated Patient    Methods Explanation;Demonstration;Tactile cues;Verbal cues;Handout    Comprehension Verbalized understanding;Returned demonstration                 PT Long Term Goals - 07/29/21 1151       PT LONG TERM GOAL #1   Title 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    Time 8    Period Weeks    Status New      PT LONG TERM GOAL #2   Title Pt will report tightness of 2/10 or less at rest    Baseline 5/10    Time 8    Period Weeks    Status New      PT LONG TERM GOAL #3   Title Pt will report improvement in tightness by at least 50%    Time 8    Period Weeks    Status New      PT LONG TERM GOAL #4   Title NA      PT LONG TERM GOAL #5   Title NA                    Plan - 07/29/21 1148     Clinical Impression Statement Pt returns to PT with continued stiffness in the Rt upper quadrant due to radiation fibrosis, capsulitis and removal of implants and prior lat flap completed in 2019.  Pt has most stiffness in  the pectoralis especially overhead and in a stretched position.  AROM is decreased from last visits into abduction only.  Started first session of STM and PROM today with pt reporting improvements already which is encouraging.  Pt will schedule 1x per week x 8 weeks.    Stability/Clinical Decision Making Stable/Uncomplicated    Clinical Decision Making Low    Rehab Potential Excellent    Clinical Impairments Affecting Rehab Potential previous radiation and multiple surgeries of right chest     PT Frequency 1x / week    PT Duration 8 weeks    PT Treatment/Interventions ADLs/Self Care Home Management;Therapeutic exercise;Patient/family education;Neuromuscular re-education;Manual techniques;Scar mobilization;Passive range of motion    PT Next Visit Plan STM/PROM work on loosening up Rt axilla and upper quadrant, postural TE and review stretches    PT Home Exercise Plan Access Code: [email or web address]    Consulted and Agree with Plan of Care Patient             Patient will benefit from skilled therapeutic intervention in order to improve the following deficits and impairments:  Increased fascial restricitons, Decreased mobility, Decreased scar mobility, Increased muscle spasms, Decreased range of motion, Decreased strength, Impaired UE functional use  Visit Diagnosis: Disorder  of the skin and subcutaneous tissue related to radiation, unspecified     Problem List Patient Active Problem List   Diagnosis Date Noted   Acquired absence of right breast 01/30/2018   Status post bilateral breast reconstruction 06/24/2017   Acquired absence of bilateral breasts and nipples 03/22/2017   Breast cancer ([place]) 03/16/2017   History of breast cancer in female 01/27/2017   Malignant neoplasm of upper-inner quadrant of left breast in female, estrogen receptor positive ([place]) 01/03/2017   HTN (hypertension) 06/14/2016   Malignant neoplasm of lower-inner quadrant of right breast of female, estrogen receptor  positive ([place]) 01/07/2016   Atherosclerosis of aorta ([place])    Smoker     Stark Bray, PT 07/29/2021, 11:53 AM  [facility] @ [facility] [place], [place], [address] Phone: [phone]   Fax:  [phone]  Name: Krystal Brooks MRN: [id number] Date of Birth: 1956-06-28

## 2021-07-29 NOTE — Patient Instructions (Signed)
Access Code: [email or web address]: [email or web address]: 11/30/2022Prepared by: Marcene Brawn TevisExercises  Supine Chest Stretch with Elbows Bent - 1 x daily - 7 x weekly - 1 sets - 3 reps - 30-60seconds hold  Open Book Chest Stretch on Towel Roll - 1 x daily - 7 x weekly - 1 sets - 5 reps - 10 second hold  Seated Lateral Trunk Stretch on Swiss Ball - 1 x daily - 7 x weekly - 1 sets - 5 reps - 6-10 second hold  Standing Shoulder External Rotation with Resistance - 1 x daily - 7 x weekly - 1 sets - 10 reps - 3-4 second hold

## 2021-07-29 NOTE — Addendum Note (Signed)
Addended by: Shan Levans R on: 07/29/2021 11:55 AM   Modules accepted: Orders

## 2021-08-11 ENCOUNTER — Ambulatory Visit: Payer: Medicare HMO

## 2021-08-13 NOTE — Progress Notes (Signed)
Chronic Care Management Pharmacy Note  08/20/2021 Name:  Krystal Brooks MRN:  [id number] DOB:  1956/02/23  Summary: FU visit - no changes patient doing very well   Subjective: BRITIANY Brooks is an 65 y.o. year old female who is a primary patient of Pickard, Cammie Mcgee, MD.  The CCM team was consulted for assistance with disease management and care coordination needs.    Engaged with patient by telephone for follow up visit in response to provider referral for pharmacy case management and/or care coordination services.   Consent to Services:  The patient was given the following information about Chronic Care Management services today, agreed to services, and gave verbal consent: 1. CCM service includes personalized support from designated clinical staff supervised by the primary care provider, including individualized plan of care and coordination with other care providers 2. 24/7 contact phone numbers for assistance for urgent and routine care needs. 3. Service will only be billed when office clinical staff spend 20 minutes or more in a month to coordinate care. 4. Only one practitioner may furnish and bill the service in a calendar month. 5.The patient may stop CCM services at any time (effective at the end of the month) by phone call to the office staff. 6. The patient will be responsible for cost sharing (co-pay) of up to 20% of the service fee (after annual deductible is met). Patient agreed to services and consent obtained.  Patient Care Team: Susy Frizzle, MD as PCP - General (Family Medicine) Excell Seltzer, MD (Inactive) as Consulting Physician (General Surgery) Druscilla Brownie, MD as Consulting Physician (Dermatology) Dillingham, [name], DO as Attending Physician (Plastic Surgery) Magrinat, Virgie Dad, MD as Consulting Physician (Oncology) Delice Bison Charlestine Massed, NP as Nurse Practitioner (Hematology and Oncology) Edythe Clarity, [facility] as Pharmacist  (Pharmacist)  Recent office visits:  03/03/21 (Video Visit) Dr. Dennard Schaumann For follow-up. STARTED Magnesium Oxide 400 mg daily and Potassium Chloride [facility] ER 10 MEQ Daily.    Recent consult visits:  03/24/21 03/10/21 [name], [name] No information given.  03/18/21 (Telephone) Neurology [name], MD. STARTED Diazepam 5 mg 1 tablet 30 minutes prior to MRI.  03/16/21 Neurology [name], MD. For procedure visit No medication changes. 03/13/21 Neurology [name], MD. For initial visit. No medication changes.  03/10/21 Derm [name] No information given.  02/28/21 [name], [name], NP  For acute Pansinusitis. STARTED Azithromycin 250 mg pack for 5 days and Prednisone 20 mg 2 tablets daily for 5 days .  02/25/21 Derm [name]. [name] No information given.   Hospital visits:  03/01/21 [facility] (2 Hours) For COVID-19. No medication changes.   Objective:  Lab Results  Component Value Date   CREATININE 0.96 07/08/2021   BUN 19 07/08/2021   GFRNONAA >60 07/08/2021   GFRAA >60 06/06/2019   NA 135 07/08/2021   K 3.4 (L) 07/08/2021   CALCIUM 9.2 07/08/2021   CO2 25 07/08/2021   GLUCOSE 116 (H) 07/08/2021    No results found for: HGBA1C, FRUCTOSAMINE, GFR, MICROALBUR  Last diabetic Eye exam: No results found for: HMDIABEYEEXA  Last diabetic Foot exam: No results found for: HMDIABFOOTEX   Lab Results  Component Value Date   CHOL 163 03/25/2021   HDL 58 03/25/2021   LDLCALC 83 03/25/2021   TRIG 122 03/25/2021   CHOLHDL 2.8 03/25/2021    Hepatic Function Latest Ref Rng & Units 07/08/2021 03/25/2021 03/01/2021  Total Protein 6.5 - 8.1 g/dL  7.6 7.3 7.4  Albumin 3.5 - 5.0 g/dL 3.9 - 4.0  AST 15 - 41 U/L _0 ALT 0 - 44 U/L _1 Alk Phosphatase 38 - 126 U/L 48 - 42  Total Bilirubin 0.3 - 1.2 mg/dL 0.3 0.4 0.3    Lab Results  Component Value Date/Time   TSH 0.68 12/01/2017 02:32 PM    CBC Latest Ref Rng & Units 07/08/2021 03/01/2021  06/05/2020  WBC 4.0 - 10.5 K/uL 7.2 14.1(H) 7.2  Hemoglobin 12.0 - 15.0 g/dL 12.4 12.0 12.1  Hematocrit 36.0 - 46.0 % 38.0 36.4 37.0  Platelets 150 - 400 K/uL 157 232 167    Lab Results  Component Value Date/Time   VD25OH 36.5 06/14/2016 08:28 AM    Clinical ASCVD: No  The 10-year ASCVD risk score (Arnett DK, et al., 2019) is: 10.5%   Values used to calculate the score:     Age: 89 years     Sex: Female     Is Non-Hispanic African American: No     Diabetic: No     Tobacco smoker: No     Systolic Blood Pressure: 989 mmHg     Is BP treated: Yes     HDL Cholesterol: 58 mg/dL     Total Cholesterol: 163 mg/dL    Depression screen [facility] 2/9 01/01/2021 07/14/2020 04/07/2018  Decreased Interest 0 0 0  Down, Depressed, Hopeless 0 0 0  PHQ - 2 Score 0 0 0  Some recent data might be hidden      Social History   Tobacco Use  Smoking Status Former   Packs/day: 0.50   Years: 40.00   Pack years: 20.00   Types: Cigarettes  Smokeless Tobacco Never   BP Readings from Last 3 Encounters:  07/08/21 (!) 154/70  05/25/21 128/64  03/13/21 132/74   Pulse Readings from Last 3 Encounters:  07/08/21 90  05/25/21 70  03/13/21 82   Wt Readings from Last 3 Encounters:  07/08/21 123 lb 8 oz (56 kg)  05/25/21 124 lb (56.2 kg)  03/13/21 124 lb (56.2 kg)   BMI Readings from Last 3 Encounters:  07/08/21 23.34 kg/m  05/25/21 23.43 kg/m  03/13/21 23.43 kg/m    Assessment/Interventions: Review of patient past medical history, allergies, medications, health status, including review of consultants reports, laboratory and other test data, was performed as part of comprehensive evaluation and provision of chronic care management services.   SDOH:  (Social Determinants of Health) assessments and interventions performed: Yes  Financial Resource Strain: Low Risk    Difficulty of Paying Living Expenses: Not hard at all    SDOH Screenings   Alcohol Screen: Low Risk    Last Alcohol Screening  Score (AUDIT): 1  Depression (PHQ2-9): Low Risk    PHQ-2 Score: 0  Financial Resource Strain: Low Risk    Difficulty of Paying Living Expenses: Not hard at all  Food Insecurity: No Food Insecurity   Worried About Charity fundraiser in the Last Year: Never true   Ran Out of Food in the Last Year: Never true  Housing: Low Risk    Last Housing Risk Score: 0  Physical Activity: Inactive   Days of Exercise per Week: 0 days   Minutes of Exercise per Session: 0 min  Social Connections: Moderately Integrated   Frequency of Communication with Friends and Family: More than three times a week   Frequency of Social Gatherings with Friends and Family: More than  three times a week   Attends Religious Services: More than 4 times per year   Active Member of Clubs or Organizations: No   Attends Archivist Meetings: Never   Marital Status: Married  Stress: No Stress Concern Present   Feeling of Stress : Not at all  Tobacco Use: Medium Risk   Smoking Tobacco Use: Former   Smokeless Tobacco Use: Never   Passive Exposure: Not on file  Transportation Needs: No Transportation Needs   Lack of Transportation (Medical): No   Lack of Transportation (Non-Medical): No    CCM Care Plan  Allergies  Allergen Reactions   Penicillins Swelling, Rash and Other (See Comments)    Older sister died from reaction to penicillin Has patient had a PCN reaction causing immediate rash, facial/tongue/throat swelling, SOB or lightheadedness with hypotension: Yes Has patient had a PCN reaction causing severe rash involving mucus membranes or skin necrosis: No Has patient had a PCN reaction that required hospitalization: No Has patient had a PCN reaction occurring within the last 10 years: No If all of the above answers are "NO", then may proceed with Cephalosporin use.    Sulfa Antibiotics Rash   Levaquin [Levofloxacin] Nausea And Vomiting    Medications Reviewed Today     Reviewed by Edythe Clarity,  [facility] (Pharmacist) on 08/20/21 at 1414  Med List Status: <None>   Medication Order Taking? Sig Documenting Provider Last Dose Status Informant  albuterol (PROVENTIL) (2.5 MG/3ML) 0.083% nebulizer solution [id number] Yes TAKE 3 MLS (2.5 MG TOTAL) BY NEBULIZATION EVERY 6 HOURS AS NEEDED WHEEZING OR SHORTNESS OF BREATH. Ishmael Holter A, FNP Taking Active   albuterol (VENTOLIN HFA) 108 (90 Base) MCG/ACT inhaler [id number] Yes INHALE 1-2 PUFFS EVERY 6 HOURS AS NEEDED FOR WHEEZING Pickard, Cammie Mcgee, MD Taking Active   Ascorbic Acid (VITAMIN C) 1000 MG tablet [id number] Yes Take 1,000 mg by mouth 2 (two) times daily.  [provider] Taking Active Self  KLOR-CON M10 10 MEQ tablet [id number] Yes TAKE 1 TABLET BY MOUTH EVERY DAY Susy Frizzle, MD Taking Active   losartan (COZAAR) 100 MG tablet [id number] Yes Take 1 tablet (100 mg total) by mouth daily. Susy Frizzle, MD Taking Active   magnesium oxide (MAG-OX) 400 (240 Mg) MG tablet [id number] Yes Take 1 tablet by mouth daily. [provider] Taking Active   montelukast (SINGULAIR) 10 MG tablet [id number] Yes TAKE 1 TABLET BY MOUTH EVERY DAY Susy Frizzle, MD Taking Active   Multiple Vitamin (MULTIVITAMIN WITH MINERALS) TABS tablet [id number] Yes Take 1 tablet by mouth daily. [provider] Taking Active Self  SYMBICORT 80-4.5 MCG/ACT inhaler [id number] Yes TAKE 2 PUFFS BY MOUTH TWICE A DAY Susy Frizzle, MD Taking Active   tamoxifen (NOLVADEX) 20 MG tablet [id number] Yes Take 1 tablet (20 mg total) by mouth at bedtime. Magrinat, Virgie Dad, MD Taking Active   triamterene-hydrochlorothiazide [facility]) 37.5-25 MG tablet [id number] Yes TAKE 1 TABLET BY MOUTH AT BEDTIME. STOP AMLODIPINE Susy Frizzle, MD Taking Active             Patient Active Problem List   Diagnosis Date Noted   Acquired absence of right breast 01/30/2018   Status post bilateral breast reconstruction 06/24/2017   Acquired absence of bilateral  breasts and nipples 03/22/2017   Breast cancer ([place]) 03/16/2017   History of breast cancer in female 01/27/2017   Malignant neoplasm of upper-inner quadrant of left breast in female, estrogen receptor positive ([place]) 01/03/2017  HTN (hypertension) 06/14/2016   Malignant neoplasm of lower-inner quadrant of right breast of female, estrogen receptor positive ([place]) 01/07/2016   Atherosclerosis of aorta ([place])    Smoker     Immunization History  Administered Date(s) Administered   Influenza-Unspecified 04/28/2017   PFIZER Comirnaty(Gray Top)Covid-19 Tri-Sucrose Vaccine 07/15/2021   PFIZER(Purple Top)SARS-COV-2 Vaccination 06/10/2020    Conditions to be addressed/monitored:  HTN, Breast cancer, HLD?  Care Plan : General Pharmacy (Adult)  Updates made by Edythe Clarity, [facility] since 08/20/2021 12:00 AM     Problem: HTN, Breast cancer, HLD   Priority: High  Onset Date: 02/13/2021     Goal: Patient-Specific Goal   Note:   Current Barriers:  None identified at this visit  Pharmacist Clinical Goal(s):  Patient will maintain control of BP as evidenced by home monitoring  through collaboration with PharmD and provider.   Interventions: 1:1 collaboration with Susy Frizzle, MD regarding development and update of comprehensive plan of care as evidenced by provider attestation and co-signature Inter-disciplinary care team collaboration (see longitudinal plan of care) Comprehensive medication review performed; medication list updated in electronic medical record  Hypertension (BP goal <140/90) -Controlled -Current treatment: Triamterene/HCTZ 37.5-25mg daily Losartan 118m daily -Medications previously tried: amlodipine  -Current home readings: not checking -Current exercise habits: walking a few times weekly -Denies hypotensive/hypertensive symptoms -Educated on BP goals and benefits of medications for prevention of heart attack, stroke and kidney damage; Daily salt intake goal  < 2300 mg; Exercise goal of 150 minutes per week; Importance of home blood pressure monitoring; Symptoms of hypotension and importance of maintaining adequate hydration; -Counseled to monitor BP at home if symptomatic, document, and provide log at future appointments -Recommended to continue current medication  Update 08/20/21 No changes to BP meds since last visit.  Denies any dizziness or HA at home.  Most recent office BP was slightly elevated, this was however at her cancer doc so possible some stress related to that visit.  No symptoms of hypertension at home. Continue current meds Has lots of family coming in for Christmas and plans to enjoy that!   Hyperlipidemia: (LDL goal < 100) -Controlled, last known lipid panel in 2016 -Current treatment: None -Medications previously tried: none noted  -Current exercise habits: occasional walking -Educated on Cholesterol goals;  Recommended lipid panel at next CPE appointment in October. -Recommended continue current meds and make appt for physical in October.  Breast Cancer  -Controlled -Current treatment  Tamoxifen 248mdaily -Medications previously tried: none noted -Sees oncologist regularly  -Recommended to continue current medication  Asthma? (Goal: minimize symptoms) -Controlled -Current treatment  Montelukast 1013maily Symbicort 80-4.5mc26m puffs bid Albuterol HFA 90 mcg prn Albuterol 0.083% nebs prn -Medications previously tried: none noted -Reports consistent use of maintenance inhaler, Symbicort -Uses albuterol infrequently -Denies any SOB or wheezing recently  -Recommended to continue current medication Assessed patient finances. Inhalers are current all affordable and patient has no concerns with obtaining them.  Update 08/20/21 Continues to rarely need her rescue inhaler. Does report that she uses her Symbicort daily for maintenance. No SOB recently. Copay on Symbicort is high, however she still is able to  afford it and does not have to go without meds. No changes needed - continue current meds.  Patient Goals/Self-Care Activities Patient will:  - take medications as prescribed focus on medication adherence by pill count check blood pressure when sypmtomatic, document, and provide at future appointments  Follow Up Plan: The care management team will  reach out to the patient again over the next 180 days.           Medication Assistance: None required.  Patient affirms current coverage meets needs.  Compliance/Adherence/Medication fill history: Care Gaps: Shingrix Pneumovax  Star-Rating Drugs: None  Patient's preferred pharmacy is[facility]  [id number] [place], [place] - [address] [place] [id number]: [phone]: [phone] [id number] [address] [place]one: [phone]: [phone]  [id number] [place] [id number]: [phone]: [phone] pill box? No - takes them out of vials Pt endorses 100% compliance  We discussed: Benefits of medication synchronization, packaging and delivery as well as enhanced pharmacist oversight with [employer]. Patient decided to: Continue current medication management strategy  Care Plan and Follow Up Patient Decision:  Patient agrees to Care Plan and Follow-up.  Plan: The care management team will reach out to the patient again over the next 180 days.  ChrBeverly MilchharmD Clinical Pharmacist [phone]

## 2021-08-17 ENCOUNTER — Ambulatory Visit: Payer: Medicare HMO | Attending: Oncology

## 2021-08-17 ENCOUNTER — Other Ambulatory Visit: Payer: Self-pay

## 2021-08-17 DIAGNOSIS — L599 Disorder of the skin and subcutaneous tissue related to radiation, unspecified: Secondary | ICD-10-CM | POA: Diagnosis not present

## 2021-08-17 NOTE — Therapy (Signed)
[facility] @ [place], [place], [address] Phone: [phone]   Fax:  [phone]  Physical Therapy Treatment  Patient Details  Name: Krystal Brooks MRN: [id number] Date of Birth: 1956/05/03 Referring Provider (PT): Dr. Jana Hakim   Encounter Date: 08/17/2021   PT End of Session - 08/17/21 1100     Visit Number 2    Number of Visits 9    Date for PT Re-Evaluation 09/23/21    Authorization Type MCR    PT Start Time 1008    PT Stop Time 1101    PT Time Calculation (min) 53 min    Activity Tolerance Patient tolerated treatment well    Behavior During Therapy [facility] for tasks assessed/performed             Past Medical History:  Diagnosis Date   Adie's pupil    LEFT EYE   Allergy    Asthma    Atherosclerosis of aorta (H[facility])    bulge above renal arteries on mri 2016   Cancer ([place]) 2017   right breast ca   Cellulitis    chest   Complication of anesthesia    VERY HARD TO WAKE UP     Eczema    Hypertension    Personal history of radiation therapy    PONV (postoperative nausea and vomiting)    Smoker     Past Surgical History:  Procedure Laterality Date   ABDOMINAL HYSTERECTOMY     BREAST BIOPSY Right 01/02/2016   BREAST EXCISIONAL BIOPSY Right 1979   BREAST IMPLANT REMOVAL Bilateral 10/05/2017   Procedure: REMOVALOF BILATERAL BREAST IMPLANTS;  Surgeon: Wallace Going, DO;  Location: [place];  Service: Plastics;  Laterality: Bilateral;   BREAST LUMPECTOMY Right 01/27/2016   BREAST LUMPECTOMY WITH RADIOACTIVE SEED AND SENTINEL LYMPH NODE BIOPSY Right 01/27/2016   Procedure: BREAST LUMPECTOMY WITH RADIOACTIVE SEED AND SENTINEL LYMPH NODE BIOPSY;  Surgeon: Excell Seltzer, MD;  Location: [place];  Service: General;  Laterality: Right;   BREAST RECONSTRUCTION WITH PLACEMENT OF TISSUE EXPANDER AND FLEX HD (ACELLULAR HYDRATED DERMIS) Bilateral 03/16/2017   Procedure:  BILATERAL IMMEDIATE BREAST RECONSTRUCTION WITH PLACEMENT OF TISSUE EXPANDER AND FLEX HD (ACELLULAR HYDRATED DERMIS);  Surgeon: Wallace Going, DO;  Location: [place];  Service: Plastics;  Laterality: Bilateral;   BREAST SURGERY  1979   cyst removed rt breast   CATARACT EXTRACTION W/ INTRAOCULAR LENS IMPLANT     RT EYE   CESAREAN SECTION     x2   EXCISION OF BREAST LESION Left 01/30/2018   Procedure: EXCISION OF LEFT BREAST SCAR;  Surgeon: Wallace Going, DO;  Location: [place];  Service: Plastics;  Laterality: Left;   LATISSIMUS FLAP TO BREAST Right 01/30/2018   Procedure: RIGHT LATISSIMUS MYOCUTANEOUS FLAP FOR RIGHT CHEST WALL RECONSTRUCTION;  Surgeon: Wallace Going, DO;  Location: [place];  Service: Plastics;  Laterality: Right;   MASTECTOMY W/ SENTINEL NODE BIOPSY Bilateral 03/16/2017   Procedure: BILATERAL TOTAL MASTECTOMY WITH LEFT SENTINEL LYMPH NODE BIOPSY;  Surgeon: Excell Seltzer, MD;  Location: [place];  Service: General;  Laterality: Bilateral;   REMOVAL OF BILATERAL TISSUE EXPANDERS WITH PLACEMENT OF BILATERAL BREAST IMPLANTS Bilateral 06/15/2017   Procedure: REMOVAL OF BILATERAL TISSUE EXPANDERS WITH PLACEMENT OF BILATERAL BREAST IMPLANTS;  Surgeon: Wallace Going, DO;  Location: [place];  Service: Plastics;  Laterality: Bilateral;   TOTAL MASTECTOMY Bilateral 03/16/2017   SENTINAL NODE  BIOPSY    There were no vitals filed for this visit.   Subjective Assessment - 08/17/21 1006     Subjective I'm starting to feel a little better. The doorway stretch is great but I think I need to be doing them more throughout the day because I am still feeling tight at the end of the day.    Pertinent History hx of bilateral breast cancer; bilateral mastectomy with implants 06/15/17 with removal of implants in February of 2019.  Radiation to the Rt breast.    Patient Stated Goals not feel as tight                               [facility] Adult  PT Treatment/Exercise - 08/17/21 0001       Manual Therapy   Manual Therapy Myofascial release;Scapular mobilization;Passive ROM    Soft tissue mobilization to the Rt pectoralis, anterior chest, and latissimus in netural and slightly propped overhead as tolerated; also, with cocoa butter,  to medial scapular border in Lt S/L    Myofascial Release Along Rt axilla to pectoralis tendon where pt palpably very tight and reports tender    Scapular Mobilization In Lt S/L into protraction and retraction; scapular depression during shoulder P/ROM    Passive ROM In Supine to Rt shoulder into flexion, abd and D2 to pts tolerance                          PT Long Term Goals - 07/29/21 1151       PT LONG TERM GOAL #1   Title 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 Pt with increased tightness and palpable trigger points at her Rt upper trap which improved by end of session. Also cont with focus on MFR to Rt axilla into pectoralis tendon where pt very tight as well. Overall she reports feeling much improved by end of session, especially around her Rt scapular and upper trap. She reports as of now is mostly just stretching in morning at beginning of day so encouraged her during session to incoporate end ROM stretching, especially in the doorway, throughout her day. This should also help decreas the tightness she was c/o at the end of her day. She  verbalized good understanding.             Patient will benefit from skilled therapeutic intervention in order to improve the following deficits and impairments:     Visit  Diagnosis: Disorder of the skin and subcutaneous tissue related to radiation, unspecified     Problem List Patient Active Problem List   Diagnosis Date Noted   Acquired absence of right breast 01/30/2018   Status post bilateral breast reconstruction 06/24/2017   Acquired absence of bilateral breasts and nipples 03/22/2017   Breast cancer ([place]) 03/16/2017   History of breast cancer in female 01/27/2017   Malignant neoplasm of upper-inner quadrant of left breast in female, estrogen receptor positive ([place]) 01/03/2017   HTN (hypertension) 06/14/2016   Malignant neoplasm of lower-inner quadrant of right breast of female, estrogen receptor positive ([place]) 01/07/2016   Atherosclerosis of aorta ([place])    Smoker     Otelia Limes, PTA 08/17/2021, 12:18 PM  [facility] @ [place], [place], [address] Phone: [phone]   Fax:  [phone]  Name: Krystal Brooks MRN: [id number] Date of Birth: May 30, 1956

## 2021-08-18 ENCOUNTER — Other Ambulatory Visit: Payer: Self-pay

## 2021-08-18 ENCOUNTER — Other Ambulatory Visit: Payer: Self-pay | Admitting: Family Medicine

## 2021-08-18 DIAGNOSIS — I10 Essential (primary) hypertension: Secondary | ICD-10-CM

## 2021-08-18 MED ORDER — LOSARTAN POTASSIUM 100 MG PO TABS: 100.0000 mg | ORAL_TABLET | Freq: Every day | ORAL | 3 refills | Status: DC

## 2021-08-20 ENCOUNTER — Ambulatory Visit (INDEPENDENT_AMBULATORY_CARE_PROVIDER_SITE_OTHER): Payer: Medicare HMO | Admitting: Pharmacist

## 2021-08-20 DIAGNOSIS — J45909 Unspecified asthma, uncomplicated: Secondary | ICD-10-CM

## 2021-08-20 DIAGNOSIS — I10 Essential (primary) hypertension: Secondary | ICD-10-CM

## 2021-08-20 NOTE — Patient Instructions (Addendum)
Visit Information   Goals Addressed             This Visit's Progress    Manage My Medicine   On track    Timeframe:  Long-Range Goal Priority:  High Start Date: 02/13/21                            Expected End Date:  08/15/21                     Follow Up Date 05/29/21    - call for medicine refill 2 or 3 days before it runs out - call if I am sick and can't take my medicine - keep a list of all the medicines I take; vitamins and herbals too    Why is this important?   These steps will help you keep on track with your medicines.   Notes:        Patient Care Plan: General Pharmacy (Adult)     Problem Identified: HTN, Breast cancer, HLD   Priority: High  Onset Date: 02/13/2021     Goal: Patient-Specific Goal   Note:   Current Barriers:  None identified at this visit  Pharmacist Clinical Goal(s):  Patient will maintain control of BP as evidenced by home monitoring  through collaboration with PharmD and provider.   Interventions: 1:1 collaboration with Susy Frizzle, MD regarding development and update of comprehensive plan of care as evidenced by provider attestation and co-signature Inter-disciplinary care team collaboration (see longitudinal plan of care) Comprehensive medication review performed; medication list updated in electronic medical record  Hypertension (BP goal <140/90) -Controlled -Current treatment: Triamterene/HCTZ 37.5-25mg  daily Losartan 100mg  daily -Medications previously tried: amlodipine  -Current home readings: not checking -Current exercise habits: walking a few times weekly -Denies hypotensive/hypertensive symptoms -Educated on BP goals and benefits of medications for prevention of heart attack, stroke and kidney damage; Daily salt intake goal < 2300 mg; Exercise goal of 150 minutes per week; Importance of home blood pressure monitoring; Symptoms of hypotension and importance of maintaining adequate hydration; -Counseled to  monitor BP at home if symptomatic, document, and provide log at future appointments -Recommended to continue current medication  Update 08/20/21 No changes to BP meds since last visit.  Denies any dizziness or HA at home.  Most recent office BP was slightly elevated, this was however at her cancer doc so possible some stress related to that visit.  No symptoms of hypertension at home. Continue current meds Has lots of family coming in for Christmas and plans to enjoy that!   Hyperlipidemia: (LDL goal < 100) -Controlled, last known lipid panel in 2016 -Current treatment: None -Medications previously tried: none noted  -Current exercise habits: occasional walking -Educated on Cholesterol goals;  Recommended lipid panel at next CPE appointment in October. -Recommended continue current meds and make appt for physical in October.  Breast Cancer  -Controlled -Current treatment  Tamoxifen 20mg  daily -Medications previously tried: none noted -Sees oncologist regularly  -Recommended to continue current medication  Asthma? (Goal: minimize symptoms) -Controlled -Current treatment  Montelukast 10mg  daily Symbicort 80-4.47mcg 2 puffs bid Albuterol HFA 90 mcg prn Albuterol 0.083% nebs prn -Medications previously tried: none noted -Reports consistent use of maintenance inhaler, Symbicort -Uses albuterol infrequently -Denies any SOB or wheezing recently  -Recommended to continue current medication Assessed patient finances. Inhalers are current all affordable and patient has no concerns with obtaining them.  Update  08/20/21 Continues to rarely need her rescue inhaler. Does report that she uses her Symbicort daily for maintenance. No SOB recently. Copay on Symbicort is high, however she still is able to afford it and does not have to go without meds. No changes needed - continue current meds.  Patient Goals/Self-Care Activities Patient will:  - take medications as prescribed focus on  medication adherence by pill count check blood pressure when sypmtomatic, document, and provide at future appointments  Follow Up Plan: The care management team will reach out to the patient again over the next 180 days.          Patient verbalizes understanding of instructions provided today and agrees to view in [place].  Telephone follow up appointment with pharmacy team member scheduled for:6 months  Edythe Clarity, [place], PharmD, [place] Clinical Pharmacist Practitioner [facility] [phone]

## 2021-08-26 ENCOUNTER — Encounter: Payer: Self-pay | Admitting: Rehabilitation

## 2021-08-26 ENCOUNTER — Ambulatory Visit: Payer: Medicare HMO | Admitting: Rehabilitation

## 2021-08-26 ENCOUNTER — Other Ambulatory Visit: Payer: Self-pay

## 2021-08-26 DIAGNOSIS — L599 Disorder of the skin and subcutaneous tissue related to radiation, unspecified: Secondary | ICD-10-CM

## 2021-08-26 NOTE — Therapy (Signed)
[facility] @ [place], [place], [address] Phone: [phone]   Fax:  [phone]  Physical Therapy Treatment  Patient Details  Name: Krystal Brooks MRN: [id number] Date of Birth: [birth year] Referring Provider (PT): Dr. Jana Hakim   Encounter Date: 08/26/2021   PT End of Session - 08/26/21 0944     Visit Number 3    Number of Visits 9    Date for PT Re-Evaluation 09/23/21    PT Start Time 0900    PT Stop Time 0944    PT Time Calculation (min) 44 min    Activity Tolerance Patient tolerated treatment well    Behavior During Therapy [facility] for tasks assessed/performed             Past Medical History:  Diagnosis Date   Adie's pupil    LEFT EYE   Allergy    Asthma    Atherosclerosis of aorta (HCC)    bulge above renal arteries on mri 2016   Cancer ([place]) 2017   right breast ca   Cellulitis    chest   Complication of anesthesia    VERY HARD TO WAKE UP     Eczema    Hypertension    Personal history of radiation therapy    PONV (postoperative nausea and vomiting)    Smoker     Past Surgical History:  Procedure Laterality Date   ABDOMINAL HYSTERECTOMY     BREAST BIOPSY Right 01/02/2016   BREAST EXCISIONAL BIOPSY Right 1979   BREAST IMPLANT REMOVAL Bilateral 10/05/2017   Procedure: REMOVALOF BILATERAL BREAST IMPLANTS;  Surgeon: [place] Going, DO;  Location: [facility];  Service: Plastics;  Laterality: Bilateral;   BREAST LUMPECTOMY Right 01/27/2016   BREAST LUMPECTOMY WITH RADIOACTIVE SEED AND SENTINEL LYMPH NODE BIOPSY Right 01/27/2016   Procedure: BREAST LUMPECTOMY WITH RADIOACTIVE SEED AND SENTINEL LYMPH NODE BIOPSY;  Surgeon: [place] Seltzer, MD;  Location: [facility];  Service: General;  Laterality: Right;   BREAST RECONSTRUCTION WITH PLACEMENT OF TISSUE EXPANDER AND FLEX HD (ACELLULAR HYDRATED DERMIS) Bilateral 03/16/2017   Procedure: BILATERAL IMMEDIATE BREAST  RECONSTRUCTION WITH PLACEMENT OF TISSUE EXPANDER AND FLEX HD (ACELLULAR HYDRATED DERMIS);  Surgeon: [place] Going, DO;  Location: [facility];  Service: Plastics;  Laterality: Bilateral;   BREAST SURGERY  1979   cyst removed rt breast   CATARACT EXTRACTION W/ INTRAOCULAR LENS IMPLANT     RT EYE   CESAREAN SECTION     x2   EXCISION OF BREAST LESION Left 01/30/2018   Procedure: EXCISION OF LEFT BREAST SCAR;  Surgeon: [place] Going, DO;  Location: [facility];  Service: Plastics;  Laterality: Left;   LATISSIMUS FLAP TO BREAST Right 01/30/2018   Procedure: RIGHT LATISSIMUS MYOCUTANEOUS FLAP FOR RIGHT CHEST WALL RECONSTRUCTION;  Surgeon: [place] Going, DO;  Location: [facility];  Service: Plastics;  Laterality: Right;   MASTECTOMY W/ SENTINEL NODE BIOPSY Bilateral 03/16/2017   Procedure: BILATERAL TOTAL MASTECTOMY WITH LEFT SENTINEL LYMPH NODE BIOPSY;  Surgeon: Excell Seltzer, MD;  Location: [facility];  Service: General;  Laterality: Bilateral;   REMOVAL OF BILATERAL TISSUE EXPANDERS WITH PLACEMENT OF BILATERAL BREAST IMPLANTS Bilateral 06/15/2017   Procedure: REMOVAL OF BILATERAL TISSUE EXPANDERS WITH PLACEMENT OF BILATERAL BREAST IMPLANTS;  Surgeon: [place] Going, DO;  Location: [facility];  Service: Plastics;  Laterality: Bilateral;   TOTAL MASTECTOMY Bilateral 03/16/2017   SENTINAL NODE BIOPSY    There were  no vitals filed for this visit.   Subjective Assessment - 08/26/21 0901     Subjective I am doing okay.  I find if I do the stretches 3x per week I am okay    Pertinent History hx of bilateral breast cancer; bilateral mastectomy with implants 06/15/17 with removal of implants in February of 2019.  Radiation to the Rt breast.    Currently in Pain? No/denies                               [facility] Adult PT Treatment/Exercise - 08/26/21 0001       Exercises   Exercises Shoulder      Shoulder Exercises: Pulleys   Flexion 2 minutes    Flexion  Limitations with initial instruction    ABduction 2 minutes    ABduction Limitations with initial instruction      Shoulder Exercises: Therapy Ball   Flexion 5 reps    Flexion Limitations 5" hold      Shoulder Exercises: Stretch   Other Shoulder Stretches single arm chest stretch 2x20"      Manual Therapy   Soft tissue mobilization to the Rt pectoralis, anterior chest, and latissimus in netural and slightly propped overhead as tolerated; also, with cocoa butter,  to medial scapular border in Lt S/L    Myofascial Release Along Rt axilla to pectoralis tendon where pt palpably very tight and reports tender    Scapular Mobilization In Lt S/L into protraction and retraction; scapular depression during shoulder P/ROM    Passive ROM In Supine to Rt shoulder into flexion, abd and D2 to pts tolerance                          PT Long Term Goals - 07/29/21 1151       PT LONG TERM GOAL #1   Title 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  Improvements noted already in PROM/muscle tightness.  Still tight Rt upper quadrant especially following lat tunnel.    PT Frequency 1x / week    PT Duration 8 weeks    PT Treatment/Interventions ADLs/Self Care Home Management;Therapeutic exercise;Patient/family education;Neuromuscular re-education;Manual techniques;Scar mobilization;Passive range of motion    PT Next Visit Plan STM/PROM work on  loosening up Rt axilla and upper quadrant, postural TE and review stretches    Consulted and Agree with Plan of Care Patient  Patient will benefit from skilled therapeutic intervention in order to improve the following deficits and impairments:     Visit Diagnosis: Disorder of the skin and subcutaneous tissue related to radiation, unspecified     Problem List Patient Active Problem List   Diagnosis Date Noted   Acquired absence of right breast 01/30/2018   Status post bilateral breast reconstruction 06/24/2017   Acquired absence of bilateral breasts and nipples 03/22/2017   Breast cancer ([place]) 03/16/2017   History of breast cancer in female 01/27/2017   Malignant neoplasm of upper-inner quadrant of left breast in female, estrogen receptor positive ([place]) 01/03/2017   HTN (hypertension) 06/14/2016   Malignant neoplasm of lower-inner quadrant of right breast of female, estrogen receptor positive ([place]) 01/07/2016   Atherosclerosis of aorta [facility])    Smoker     Stark Bray, PT 08/26/2021, 9:46 AM  [facility] @ [place], [place], [address] Phone: [phone]   Fax:  [phone]  Name: NIKAELA COYNE MRN: [id number] Date of Birth: 1955/12/12

## 2021-08-29 DIAGNOSIS — E785 Hyperlipidemia, unspecified: Secondary | ICD-10-CM | POA: Diagnosis not present

## 2021-08-29 DIAGNOSIS — I10 Essential (primary) hypertension: Secondary | ICD-10-CM | POA: Diagnosis not present

## 2021-08-29 DIAGNOSIS — J45909 Unspecified asthma, uncomplicated: Secondary | ICD-10-CM

## 2021-09-01 ENCOUNTER — Encounter: Payer: Self-pay | Admitting: Rehabilitation

## 2021-09-01 ENCOUNTER — Other Ambulatory Visit: Payer: Self-pay

## 2021-09-01 ENCOUNTER — Ambulatory Visit: Payer: Medicare HMO | Attending: Oncology | Admitting: Rehabilitation

## 2021-09-01 DIAGNOSIS — L599 Disorder of the skin and subcutaneous tissue related to radiation, unspecified: Secondary | ICD-10-CM | POA: Insufficient documentation

## 2021-09-01 DIAGNOSIS — Z483 Aftercare following surgery for neoplasm: Secondary | ICD-10-CM | POA: Insufficient documentation

## 2021-09-01 NOTE — Therapy (Signed)
[facility] @ [address] [place], [place], [address] Phone: [phone]   Fax:  [phone]  Physical Therapy Treatment  Patient Details  Name: Krystal Brooks MRN: [id number] Date of Birth: [birth year] Referring Provider (PT): Dr. Jana Hakim   Encounter Date: 09/01/2021   PT End of Session - 09/01/21 0944     Visit Number 4    Number of Visits 9    Date for PT Re-Evaluation 09/23/21    PT Start Time 0900    PT Stop Time 0945    PT Time Calculation (min) 45 min    Activity Tolerance Patient tolerated treatment well    Behavior During Therapy [facility] for tasks assessed/performed             Past Medical History:  Diagnosis Date   Adie's pupil    LEFT EYE   Allergy    Asthma    Atherosclerosis of aorta ([place])    bulge above renal arteries on mri 2016   Cancer ([place]) 2017   right breast ca   Cellulitis    chest   Complication of anesthesia    VERY HARD TO WAKE UP     Eczema    Hypertension    Personal history of radiation therapy    PONV (postoperative nausea and vomiting)    Smoker     Past Surgical History:  Procedure Laterality Date   ABDOMINAL HYSTERECTOMY     BREAST BIOPSY Right 01/02/2016   BREAST EXCISIONAL BIOPSY Right 1979   BREAST IMPLANT REMOVAL Bilateral 10/05/2017   Procedure: REMOVALOF BILATERAL BREAST IMPLANTS;  Surgeon: [place] Going, DO;  Location: [facility];  Service: Plastics;  Laterality: Bilateral;   BREAST LUMPECTOMY Right 01/27/2016   BREAST LUMPECTOMY WITH RADIOACTIVE SEED AND SENTINEL LYMPH NODE BIOPSY Right 01/27/2016   Procedure: BREAST LUMPECTOMY WITH RADIOACTIVE SEED AND SENTINEL LYMPH NODE BIOPSY;  Surgeon: Excell Seltzer, MD;  Location: [facility];  Service: General;  Laterality: Right;   BREAST RECONSTRUCTION WITH PLACEMENT OF TISSUE EXPANDER AND FLEX HD (ACELLULAR HYDRATED DERMIS) Bilateral 03/16/2017   Procedure: BILATERAL IMMEDIATE BREAST  RECONSTRUCTION WITH PLACEMENT OF TISSUE EXPANDER AND FLEX HD (ACELLULAR HYDRATED DERMIS);  Surgeon: [place] Going, DO;  Location: [facility];  Service: Plastics;  Laterality: Bilateral;   BREAST SURGERY  1979   cyst removed rt breast   CATARACT EXTRACTION W/ INTRAOCULAR LENS IMPLANT     RT EYE   CESAREAN SECTION     x2   EXCISION OF BREAST LESION Left 01/30/2018   Procedure: EXCISION OF LEFT BREAST SCAR;  Surgeon: [place] Going, DO;  Location: [facility];  Service: Plastics;  Laterality: Left;   LATISSIMUS FLAP TO BREAST Right 01/30/2018   Procedure: RIGHT LATISSIMUS MYOCUTANEOUS FLAP FOR RIGHT CHEST WALL RECONSTRUCTION;  Surgeon: [place] Going, DO;  Location: [facility];  Service: Plastics;  Laterality: Right;   MASTECTOMY W/ SENTINEL NODE BIOPSY Bilateral 03/16/2017   Procedure: BILATERAL TOTAL MASTECTOMY WITH LEFT SENTINEL LYMPH NODE BIOPSY;  Surgeon: Excell Seltzer, MD;  Location: [facility];  Service: General;  Laterality: Bilateral;   REMOVAL OF BILATERAL TISSUE EXPANDERS WITH PLACEMENT OF BILATERAL BREAST IMPLANTS Bilateral 06/15/2017   Procedure: REMOVAL OF BILATERAL TISSUE EXPANDERS WITH PLACEMENT OF BILATERAL BREAST IMPLANTS;  Surgeon: [place] Going, DO;  Location: [place];  Service: Plastics;  Laterality: Bilateral;   TOTAL MASTECTOMY Bilateral 03/16/2017   SENTINAL NODE BIOPSY    There were  no vitals filed for this visit.   Subjective Assessment - 09/01/21 0901     Subjective doing fine.    Pertinent History hx of bilateral breast cancer; bilateral mastectomy with implants 06/15/17 with removal of implants in February of 2019.  Radiation to the Rt breast.    Currently in Pain? No/denies                               [facility] Adult PT Treatment/Exercise - 09/01/21 0001       Shoulder Exercises: Supine   Horizontal ABduction Both;10 reps    Theraband Level (Shoulder Horizontal ABduction) Level 1 (Yellow)    Horizontal ABduction  Limitations with initial instruction    External Rotation Both;10 reps    Theraband Level (Shoulder External Rotation) Level 1 (Yellow)    External Rotation Limitations with initial instruction      Shoulder Exercises: Standing   Row 15 reps    Theraband Level (Shoulder Row) Level 1 (Yellow)      Shoulder Exercises: Pulleys   Flexion 2 minutes    ABduction 2 minutes      Shoulder Exercises: Therapy Ball   Flexion 5 reps    Flexion Limitations 5" hold      Manual Therapy   Soft tissue mobilization to the Rt pectoralis, anterior chest, and latissimus in netural and slightly propped overhead as tolerated; also, with cocoa butter,  to medial scapular border in Lt S/L    Myofascial Release Along Rt axilla to pectoralis tendon where pt palpably very tight and reports tender    Passive ROM In Supine to Rt shoulder into flexion, abd and D2 to pts tolerance                          PT Long Term Goals - 07/29/21 1151       PT LONG TERM GOAL #1   Title 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    PT Frequency 1x / week    PT Duration 8 weeks    PT Treatment/Interventions ADLs/Self Care Home Management;Therapeutic exercise;Patient/family education;Neuromuscular re-education;Manual techniques;Scar mobilization;Passive range of motion    PT Next  Visit Plan STM/PROM work on loosening up Rt axilla and upper quadrant, postural TE and review stretches - try full supine scap    Consulted and Agree with Plan of Care Patient  Patient will benefit from skilled therapeutic intervention in order to improve the following deficits and impairments:     Visit Diagnosis: Disorder of the skin and subcutaneous tissue related to radiation, unspecified     Problem List Patient Active Problem List   Diagnosis Date Noted   Acquired absence of right breast 01/30/2018   Status post bilateral breast reconstruction 06/24/2017   Acquired absence of bilateral breasts and nipples 03/22/2017   Breast cancer ([place]) 03/16/2017   History of breast cancer in female 01/27/2017   Malignant neoplasm of upper-inner quadrant of left breast in female, estrogen receptor positive ([place]) 01/03/2017   HTN (hypertension) 06/14/2016   Malignant neoplasm of lower-inner quadrant of right breast of female, estrogen receptor positive ([place]) 01/07/2016   Atherosclerosis of aorta [facility])    Smoker     Stark Bray, PT 09/01/2021, 9:47 AM  [facility] @ [place], [place], [address] Phone: [phone]   Fax:  [phone]  Name: Krystal Brooks MRN: [id number] Date of Birth: 1955/11/19

## 2021-09-07 ENCOUNTER — Ambulatory Visit: Payer: Medicare HMO | Admitting: Rehabilitation

## 2021-09-07 ENCOUNTER — Other Ambulatory Visit: Payer: Self-pay

## 2021-09-07 ENCOUNTER — Encounter: Payer: Self-pay | Admitting: Rehabilitation

## 2021-09-07 DIAGNOSIS — Z483 Aftercare following surgery for neoplasm: Secondary | ICD-10-CM | POA: Diagnosis not present

## 2021-09-07 DIAGNOSIS — L599 Disorder of the skin and subcutaneous tissue related to radiation, unspecified: Secondary | ICD-10-CM | POA: Diagnosis not present

## 2021-09-07 NOTE — Patient Instructions (Signed)
Over Head Pull: Narrow and Wide Grip   Cancer Rehab [phone]   On back, knees bent, feet flat, band across thighs, elbows straight but relaxed. Pull hands apart (start). Keeping elbows straight, bring arms up and over head, hands toward floor. Keep pull steady on band. Hold momentarily. Return slowly, keeping pull steady, back to start.  Repeat _5-10__ times. Band color __yellow____   Side Pull: Double Arm   On back, knees bent, feet flat. Arms perpendicular to body, shoulder level, elbows straight but relaxed. Pull arms out to sides, elbows straight. Resistance band comes across collarbones, hands toward floor. Hold momentarily. Slowly return to starting position. Repeat _5-10__ times. Band color _yellow____   Sword   On back, knees bent, feet flat, left hand on left hip, right hand above left. Pull right arm DIAGONALLY (hip to shoulder) across chest. Bring right arm along head toward floor. Hold momentarily. Slowly return to starting position. Repeat _5-10__ times. Do with left arm. Band color _yellow_____   Shoulder Rotation: Double Arm   On back, knees bent, feet flat, elbows tucked at sides, bent 90, hands palms up. Pull hands apart and down toward floor, keeping elbows near sides. Hold momentarily. Slowly return to starting position. Repeat _5-10__ times. Band color __yellow____    

## 2021-09-07 NOTE — Therapy (Signed)
[facility] @ [place], [place], [address] Phone: [phone]   Fax:  [phone]  Physical Therapy Treatment  Patient Details  Name: Krystal Brooks MRN: [id number] Date of Birth: 1956/04/13 Referring Provider (PT): Dr. Jana Hakim   Encounter Date: 09/07/2021   PT End of Session - 09/07/21 0948     Visit Number 5    Number of Visits 9    Date for PT Re-Evaluation 09/23/21    PT Start Time 0904    PT Stop Time 0949    PT Time Calculation (min) 45 min    Activity Tolerance Patient tolerated treatment well    Behavior During Therapy [facility] for tasks assessed/performed             Past Medical History:  Diagnosis Date   Adie's pupil    LEFT EYE   Allergy    Asthma    Atherosclerosis of aorta (HCC)    bulge above renal arteries on mri 2016   Cancer ([place]) 2017   right breast ca   Cellulitis    chest   Complication of anesthesia    VERY HARD TO WAKE UP     Eczema    Hypertension    Personal history of radiation therapy    PONV (postoperative nausea and vomiting)    Smoker     Past Surgical History:  Procedure Laterality Date   ABDOMINAL HYSTERECTOMY     BREAST BIOPSY Right 01/02/2016   BREAST EXCISIONAL BIOPSY Right 1979   BREAST IMPLANT REMOVAL Bilateral 10/05/2017   Procedure: REMOVALOF BILATERAL BREAST IMPLANTS;  Surgeon: Wallace Going, DO;  Location: [place];  Service: Plastics;  Laterality: Bilateral;   BREAST LUMPECTOMY Right 01/27/2016   BREAST LUMPECTOMY WITH RADIOACTIVE SEED AND SENTINEL LYMPH NODE BIOPSY Right 01/27/2016   Procedure: BREAST LUMPECTOMY WITH RADIOACTIVE SEED AND SENTINEL LYMPH NODE BIOPSY;  Surgeon: Excell Seltzer, MD;  Location: [place];  Service: General;  Laterality: Right;   BREAST RECONSTRUCTION WITH PLACEMENT OF TISSUE EXPANDER AND FLEX HD (ACELLULAR HYDRATED DERMIS) Bilateral 03/16/2017   Procedure: BILATERAL IMMEDIATE BREAST  RECONSTRUCTION WITH PLACEMENT OF TISSUE EXPANDER AND FLEX HD (ACELLULAR HYDRATED DERMIS);  Surgeon: Wallace Going, DO;  Location: [place];  Service: Plastics;  Laterality: Bilateral;   BREAST SURGERY  1979   cyst removed rt breast   CATARACT EXTRACTION W/ INTRAOCULAR LENS IMPLANT     RT EYE   CESAREAN SECTION     x2   EXCISION OF BREAST LESION Left 01/30/2018   Procedure: EXCISION OF LEFT BREAST SCAR;  Surgeon: Wallace Going, DO;  Location: [place];  Service: Plastics;  Laterality: Left;   LATISSIMUS FLAP TO BREAST Right 01/30/2018   Procedure: RIGHT LATISSIMUS MYOCUTANEOUS FLAP FOR RIGHT CHEST WALL RECONSTRUCTION;  Surgeon: Wallace Going, DO;  Location: [place];  Service: Plastics;  Laterality: Right;   MASTECTOMY W/ SENTINEL NODE BIOPSY Bilateral 03/16/2017   Procedure: BILATERAL TOTAL MASTECTOMY WITH LEFT SENTINEL LYMPH NODE BIOPSY;  Surgeon: Excell Seltzer, MD;  Location: [place];  Service: General;  Laterality: Bilateral;   REMOVAL OF BILATERAL TISSUE EXPANDERS WITH PLACEMENT OF BILATERAL BREAST IMPLANTS Bilateral 06/15/2017   Procedure: REMOVAL OF BILATERAL TISSUE EXPANDERS WITH PLACEMENT OF BILATERAL BREAST IMPLANTS;  Surgeon: Wallace Going, DO;  Location: [place];  Service: Plastics;  Laterality: Bilateral;   TOTAL MASTECTOMY Bilateral 03/16/2017   SENTINAL NODE BIOPSY    There were  no vitals filed for this visit.   Subjective Assessment - 09/07/21 0903     Subjective nothing new    Pertinent History hx of bilateral breast cancer; bilateral mastectomy with implants 06/15/17 with removal of implants in February of 2019.  Radiation to the Rt breast.    Currently in Pain? No/denies                               [facility] Adult PT Treatment/Exercise - 09/07/21 0001       Shoulder Exercises: Supine   Horizontal ABduction Both;10 reps    Theraband Level (Shoulder Horizontal ABduction) Level 1 (Yellow)    External Rotation  Both;10 reps    Theraband Level (Shoulder External Rotation) Level 1 (Yellow)    Other Supine Exercises gave HEP handout supine scap      Shoulder Exercises: Standing   Extension Both;10 reps    Theraband Level (Shoulder Extension) Level 1 (Yellow)    Row 15 reps    Theraband Level (Shoulder Row) Level 1 (Yellow)      Shoulder Exercises: Pulleys   Flexion 2 minutes    ABduction 2 minutes      Shoulder Exercises: Therapy Ball   Flexion 5 reps    Flexion Limitations 5" hold    ABduction 5 reps    ABduction Limitations 5" hold      Manual Therapy   Soft tissue mobilization to the Rt pectoralis, anterior chest, and latissimus in netural and slightly propped overhead as tolerated; also, with cocoa butter,  to medial scapular border in Lt S/L    Myofascial Release Along Rt axilla to pectoralis tendon where pt palpably very tight and reports tender    Passive ROM In Supine to Rt shoulder into flexion, abd and D2 to pts tolerance                          PT Long Term Goals - 07/29/21 1151       PT LONG TERM GOAL #1   Title 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  Felt some muscle soreness post TE last visit but in a good post exercise way.    PT Frequency 1x / week    PT Duration 8 weeks    PT Treatment/Interventions ADLs/Self Care Home Management;Therapeutic exercise;Patient/family education;Neuromuscular re-education;Manual techniques;Scar  mobilization;Passive range of motion    Consulted and Agree with Plan of Care Patient  Patient will benefit from skilled therapeutic intervention in order to improve the following deficits and impairments:     Visit Diagnosis: Disorder of the skin and subcutaneous tissue related to radiation, unspecified     Problem List Patient Active Problem List   Diagnosis Date Noted   Acquired absence of right breast 01/30/2018   Status post bilateral breast reconstruction 06/24/2017   Acquired absence of bilateral breasts and nipples 03/22/2017   Breast cancer ([place]) 03/16/2017   History of breast cancer in female 01/27/2017   Malignant neoplasm of upper-inner quadrant of left breast in female, estrogen receptor positive ([place]) 01/03/2017   HTN (hypertension) 06/14/2016   Malignant neoplasm of lower-inner quadrant of right breast of female, estrogen receptor positive ([place]) 01/07/2016   Atherosclerosis of aorta [facility])    Smoker     Stark Bray, PT 09/07/2021, 9:51 AM  [facility] @ [place], [place], [address] Phone: [phone]   Fax:  [phone]  Name: Krystal Brooks MRN: [id number] Date of Birth: 10/17/1955

## 2021-09-14 ENCOUNTER — Encounter: Payer: Medicare HMO | Admitting: Rehabilitation

## 2021-09-16 ENCOUNTER — Other Ambulatory Visit: Payer: Self-pay | Admitting: Family Medicine

## 2021-09-18 ENCOUNTER — Ambulatory Visit: Payer: Medicare HMO

## 2021-09-21 ENCOUNTER — Encounter: Payer: Self-pay | Admitting: Rehabilitation

## 2021-09-21 ENCOUNTER — Other Ambulatory Visit: Payer: Self-pay

## 2021-09-21 ENCOUNTER — Ambulatory Visit: Payer: Medicare HMO | Admitting: Rehabilitation

## 2021-09-21 DIAGNOSIS — L599 Disorder of the skin and subcutaneous tissue related to radiation, unspecified: Secondary | ICD-10-CM

## 2021-09-21 DIAGNOSIS — Z483 Aftercare following surgery for neoplasm: Secondary | ICD-10-CM | POA: Diagnosis not present

## 2021-09-21 NOTE — Therapy (Signed)
[facility] @ [place], [place], [address] Phone: [phone]   Fax:  [phone]  Physical Therapy Treatment  Patient Details  Name: Krystal Brooks MRN: [id number] Date of Birth: 11-20-1955 Referring Provider (PT): Dr. Jana Hakim   Encounter Date: 09/21/2021   PT End of Session - 09/21/21 0951     Visit Number 6    Number of Visits 9    Date for PT Re-Evaluation 09/23/21    PT Start Time 0903    PT Stop Time 0951    PT Time Calculation (min) 48 min    Activity Tolerance Patient tolerated treatment well    Behavior During Therapy [facility] for tasks assessed/performed             Past Medical History:  Diagnosis Date   Adie's pupil    LEFT EYE   Allergy    Asthma    Atherosclerosis of aorta (HCC)    bulge above renal arteries on mri 2016   Cancer ([place]) 2017   right breast ca   Cellulitis    chest   Complication of anesthesia    VERY HARD TO WAKE UP     Eczema    Hypertension    Personal history of radiation therapy    PONV (postoperative nausea and vomiting)    Smoker     Past Surgical History:  Procedure Laterality Date   ABDOMINAL HYSTERECTOMY     BREAST BIOPSY Right 01/02/2016   BREAST EXCISIONAL BIOPSY Right 1979   BREAST IMPLANT REMOVAL Bilateral 10/05/2017   Procedure: REMOVALOF BILATERAL BREAST IMPLANTS;  Surgeon: Wallace Going, DO;  Location: [place];  Service: Plastics;  Laterality: Bilateral;   BREAST LUMPECTOMY Right 01/27/2016   BREAST LUMPECTOMY WITH RADIOACTIVE SEED AND SENTINEL LYMPH NODE BIOPSY Right 01/27/2016   Procedure: BREAST LUMPECTOMY WITH RADIOACTIVE SEED AND SENTINEL LYMPH NODE BIOPSY;  Surgeon: Excell Seltzer, MD;  Location: [place];  Service: General;  Laterality: Right;   BREAST RECONSTRUCTION WITH PLACEMENT OF TISSUE EXPANDER AND FLEX HD (ACELLULAR HYDRATED DERMIS) Bilateral 03/16/2017   Procedure: BILATERAL IMMEDIATE BREAST  RECONSTRUCTION WITH PLACEMENT OF TISSUE EXPANDER AND FLEX HD (ACELLULAR HYDRATED DERMIS);  Surgeon: Wallace Going, DO;  Location: [place];  Service: Plastics;  Laterality: Bilateral;   BREAST SURGERY  1979   cyst removed rt breast   CATARACT EXTRACTION W/ INTRAOCULAR LENS IMPLANT     RT EYE   CESAREAN SECTION     x2   EXCISION OF BREAST LESION Left 01/30/2018   Procedure: EXCISION OF LEFT BREAST SCAR;  Surgeon: Wallace Going, DO;  Location: [place];  Service: Plastics;  Laterality: Left;   LATISSIMUS FLAP TO BREAST Right 01/30/2018   Procedure: RIGHT LATISSIMUS MYOCUTANEOUS FLAP FOR RIGHT CHEST WALL RECONSTRUCTION;  Surgeon: Wallace Going, DO;  Location: [place];  Service: Plastics;  Laterality: Right;   MASTECTOMY W/ SENTINEL NODE BIOPSY Bilateral 03/16/2017   Procedure: BILATERAL TOTAL MASTECTOMY WITH LEFT SENTINEL LYMPH NODE BIOPSY;  Surgeon: Excell Seltzer, MD;  Location: [place];  Service: General;  Laterality: Bilateral;   REMOVAL OF BILATERAL TISSUE EXPANDERS WITH PLACEMENT OF BILATERAL BREAST IMPLANTS Bilateral 06/15/2017   Procedure: REMOVAL OF BILATERAL TISSUE EXPANDERS WITH PLACEMENT OF BILATERAL BREAST IMPLANTS;  Surgeon: Wallace Going, DO;  Location: [place];  Service: Plastics;  Laterality: Bilateral;   TOTAL MASTECTOMY Bilateral 03/16/2017   SENTINAL NODE BIOPSY    There were  no vitals filed for this visit.   Subjective Assessment - 09/21/21 0905     Subjective nothing new. Maybe its starting to plateau at bit in improvements    Pertinent History hx of bilateral breast cancer; bilateral mastectomy with implants 06/15/17 with removal of implants in February of 2019.  Radiation to the Rt breast.    Currently in Pain? No/denies                               [facility] Adult PT Treatment/Exercise - 09/21/21 0001       Shoulder Exercises: Supine   Horizontal ABduction Both;15 reps    Theraband Level (Shoulder Horizontal  ABduction) Level 1 (Yellow)      Shoulder Exercises: Standing   Extension Both;20 reps    Theraband Level (Shoulder Extension) Level 1 (Yellow)    Row 20 reps    Theraband Level (Shoulder Row) Level 1 (Yellow)      Shoulder Exercises: Pulleys   Flexion 2 minutes    ABduction 2 minutes      Shoulder Exercises: Therapy Ball   Flexion 5 reps    Flexion Limitations 5" hold    ABduction 5 reps    ABduction Limitations 5" hold      Shoulder Exercises: Stretch   Other Shoulder Stretches doorway single arm 2x20"      Manual Therapy   Soft tissue mobilization to the Rt pectoralis, anterior chest, and latissimus in netural and slightly propped overhead as tolerated; also, with cocoa butter,  to medial scapular border in Lt S/L    Myofascial Release Along Rt axilla to pectoralis tendon where pt palpably very tight and reports tender    Passive ROM In Supine to Rt shoulder into flexion, abd and D2 to pts tolerance                          PT Long Term Goals - 07/29/21 1151       PT LONG TERM GOAL #1   Title 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 Pt has one more visit before DC and feels like she is starting to plateau in terms of progress so this may be well timed.    PT Frequency 1x / week    PT Duration 8 weeks    PT Treatment/Interventions ADLs/Self Care Home  Management;Therapeutic exercise;Patient/family education;Neuromuscular re-education;Manual techniques;Scar mobilization;Passive range of motion  PT Next Visit Plan STM/PROM work on loosening up Rt axilla and upper quadrant, postural TE and review stretches - try full supine scap    Consulted and Agree with Plan of Care Patient             Patient will benefit from skilled therapeutic intervention in order to improve the following deficits and impairments:     Visit Diagnosis: Disorder of the skin and subcutaneous tissue related to radiation, unspecified     Problem List Patient Active Problem List   Diagnosis Date Noted   Acquired absence of right breast 01/30/2018   Status post bilateral breast reconstruction 06/24/2017   Acquired absence of bilateral breasts and nipples 03/22/2017   Breast cancer ([place]) 03/16/2017   History of breast cancer in female 01/27/2017   Malignant neoplasm of upper-inner quadrant of left breast in female, estrogen receptor positive ([place]) 01/03/2017   HTN (hypertension) 06/14/2016   Malignant neoplasm of lower-inner quadrant of right breast of female, estrogen receptor positive ([place]) 01/07/2016   Atherosclerosis of aorta ([place])    Smoker     Stark Bray, PT 09/21/2021, 9:53 AM  [facility] @ [place], [place], [address] Phone: [phone]   Fax:  [phone]  Name: Krystal Brooks MRN: [id number] Date of Birth: Jan 28, 1956

## 2021-09-28 ENCOUNTER — Other Ambulatory Visit: Payer: Self-pay

## 2021-09-28 ENCOUNTER — Ambulatory Visit: Payer: Medicare HMO | Admitting: Rehabilitation

## 2021-09-28 ENCOUNTER — Encounter: Payer: Self-pay | Admitting: Rehabilitation

## 2021-09-28 DIAGNOSIS — L599 Disorder of the skin and subcutaneous tissue related to radiation, unspecified: Secondary | ICD-10-CM | POA: Diagnosis not present

## 2021-09-28 DIAGNOSIS — Z483 Aftercare following surgery for neoplasm: Secondary | ICD-10-CM

## 2021-09-28 NOTE — Patient Instructions (Signed)
Access Code: [email or web address]  URL: [email or web address]: 09/28/2021 Prepared by: Shan Levans Exercises  Doorway Pec Stretch at 90 Degrees Abduction - 1 x daily - 7 x weekly - 1-3 sets - 3 reps - 20-30 seconds hold  Seated Lateral Trunk Stretch on Swiss Ball - 1 x daily - 7 x weekly - 1 sets - 5 reps - 6-10 second hold  Supine Shoulder Horizontal Abduction with Resistance - 2 x daily - 4-5 x weekly - 1-2 sets - 10 reps - 3 sec hold  Supine Shoulder External Rotation with Resistance - 2 x daily - 4-5 x weekly - 1-2 sets - 10 reps - 3 sec hold  Supine narrow and wide grip flexion - 4 x daily - 4-5 x weekly - 1-2 sets - 10 reps - 3 sec hold  Supine PNF D2 - 1 x daily - 4-5 x weekly - 1-2 sets - 10 reps - 3 sec hold

## 2021-09-28 NOTE — Therapy (Signed)
[facility] @ [place], [place], [address] Phone: [phone]   Fax:  [phone]  Physical Therapy Treatment  Patient Details  Name: Krystal Brooks MRN: [id number] Date of Birth: 1956-02-25 Referring Provider (PT): Dr. Jana Hakim   Encounter Date: 09/28/2021   PT End of Session - 09/28/21 0856     Visit Number 7    Number of Visits 9    Date for PT Re-Evaluation 09/23/21    PT Start Time 0900    PT Stop Time 0948    PT Time Calculation (min) 48 min    Activity Tolerance Patient tolerated treatment well    Behavior During Therapy [facility] for tasks assessed/performed             Past Medical History:  Diagnosis Date   Adie's pupil    LEFT EYE   Allergy    Asthma    Atherosclerosis of aorta (HCC)    bulge above renal arteries on mri 2016   Cancer ([place]) 2017   right breast ca   Cellulitis    chest   Complication of anesthesia    VERY HARD TO WAKE UP     Eczema    Hypertension    Personal history of radiation therapy    PONV (postoperative nausea and vomiting)    Smoker     Past Surgical History:  Procedure Laterality Date   ABDOMINAL HYSTERECTOMY     BREAST BIOPSY Right 01/02/2016   BREAST EXCISIONAL BIOPSY Right 1979   BREAST IMPLANT REMOVAL Bilateral 10/05/2017   Procedure: REMOVALOF BILATERAL BREAST IMPLANTS;  Surgeon: Wallace Going, DO;  Location: [place];  Service: Plastics;  Laterality: Bilateral;   BREAST LUMPECTOMY Right 01/27/2016   BREAST LUMPECTOMY WITH RADIOACTIVE SEED AND SENTINEL LYMPH NODE BIOPSY Right 01/27/2016   Procedure: BREAST LUMPECTOMY WITH RADIOACTIVE SEED AND SENTINEL LYMPH NODE BIOPSY;  Surgeon: Excell Seltzer, MD;  Location: [place];  Service: General;  Laterality: Right;   BREAST RECONSTRUCTION WITH PLACEMENT OF TISSUE EXPANDER AND FLEX HD (ACELLULAR HYDRATED DERMIS) Bilateral 03/16/2017   Procedure: BILATERAL IMMEDIATE BREAST  RECONSTRUCTION WITH PLACEMENT OF TISSUE EXPANDER AND FLEX HD (ACELLULAR HYDRATED DERMIS);  Surgeon: Wallace Going, DO;  Location: [place];  Service: Plastics;  Laterality: Bilateral;   BREAST SURGERY  1979   cyst removed rt breast   CATARACT EXTRACTION W/ INTRAOCULAR LENS IMPLANT     RT EYE   CESAREAN SECTION     x2   EXCISION OF BREAST LESION Left 01/30/2018   Procedure: EXCISION OF LEFT BREAST SCAR;  Surgeon: Wallace Going, DO;  Location: [place];  Service: Plastics;  Laterality: Left;   LATISSIMUS FLAP TO BREAST Right 01/30/2018   Procedure: RIGHT LATISSIMUS MYOCUTANEOUS FLAP FOR RIGHT CHEST WALL RECONSTRUCTION;  Surgeon: Wallace Going, DO;  Location: [place];  Service: Plastics;  Laterality: Right;   MASTECTOMY W/ SENTINEL NODE BIOPSY Bilateral 03/16/2017   Procedure: BILATERAL TOTAL MASTECTOMY WITH LEFT SENTINEL LYMPH NODE BIOPSY;  Surgeon: Excell Seltzer, MD;  Location: [place];  Service: General;  Laterality: Bilateral;   REMOVAL OF BILATERAL TISSUE EXPANDERS WITH PLACEMENT OF BILATERAL BREAST IMPLANTS Bilateral 06/15/2017   Procedure: REMOVAL OF BILATERAL TISSUE EXPANDERS WITH PLACEMENT OF BILATERAL BREAST IMPLANTS;  Surgeon: Wallace Going, DO;  Location: [place];  Service: Plastics;  Laterality: Bilateral;   TOTAL MASTECTOMY Bilateral 03/16/2017   SENTINAL NODE BIOPSY    There were  no vitals filed for this visit.   Subjective Assessment - 09/28/21 0920     Subjective Ready to be done    Pertinent History hx of bilateral breast cancer; bilateral mastectomy with implants 06/15/17 with removal of implants in February of 2019.  Radiation to the Rt breast.    Currently in Pain? No/denies                [facility] PT Assessment - 09/28/21 0001       AROM   Right Shoulder Flexion 146 Degrees   pull in axilla   Right Shoulder ABduction 160 Degrees   axilla mild pull   Right Shoulder Horizontal ABduction 30 Degrees   pull                           [facility] Adult PT Treatment/Exercise - 09/28/21 0001       Exercises   Other Exercises  updated to final HEP per instruction section with performance of each as instructed.  No more vcs needed for performance      Manual Therapy   Soft tissue mobilization to the Rt pectoralis, anterior chest, and latissimus in netural and slightly propped overhead as tolerated;    Myofascial Release Along Rt axilla    Passive ROM In Supine to Rt shoulder into flexion, abd and D2 to pts tolerance                          PT Long Term Goals - 09/28/21 0902       PT LONG TERM GOAL #1   Title 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    Status Achieved      PT LONG TERM GOAL #2   Title Pt will report tightness of 2/10 or less at rest    Baseline anywhere from 2-5/10    Status Partially Met      PT LONG TERM GOAL #3   Title Pt will report improvement in tightness by at least 50%    Status Partially Met                   Plan - 09/28/21 0959     Clinical Impression Statement DC visit today.  Pt has plateaued in terms of still being stiff but improved.  Abduction and horizontal abduction have greatly improved overall.  Pt is now ind with final HEP    PT Next Visit Plan DC    Consulted and Agree with Plan of Care Patient             Patient will benefit from skilled therapeutic intervention in order to improve the following deficits and impairments:  Increased fascial restricitons, Decreased mobility, Decreased scar mobility, Increased muscle spasms, Decreased range of motion, Decreased strength, Impaired UE functional use  Visit Diagnosis: Aftercare following surgery for neoplasm     Problem List Patient Active Problem List   Diagnosis Date Noted   Acquired absence of right breast 01/30/2018   Status post bilateral breast reconstruction 06/24/2017   Acquired absence of bilateral breasts and nipples 03/22/2017    Breast cancer ([employer]) 03/16/2017   History of breast cancer in female 01/27/2017   Malignant neoplasm of upper-inner quadrant of left breast in female, estrogen receptor positive ([place]) 01/03/2017   HTN (hypertension) 06/14/2016   Malignant neoplasm of lower-inner quadrant of right breast of female, estrogen receptor positive ([place]) 01/07/2016  Atherosclerosis of aorta [facility])    Smoker     Stark Bray, PT 09/28/2021, 10:00 AM  [facility] @ [place], [place], [address] Phone: [phone]   Fax:  [phone]  Name: ELIN FENLEY MRN: [id number] Date of Birth: [birth year]  PHYSICAL THERAPY DISCHARGE SUMMARY  Visits from Start of Care: 7  Current functional level related to goals / functional outcomes: See above   Remaining deficits: Radiation fibrosis   Education / Equipment: HEP Plan: Patient agrees to discharge.  Patient goals were not met. Patient is being discharged due to meeting the stated rehab goals.

## 2021-10-30 ENCOUNTER — Telehealth: Payer: Self-pay

## 2021-10-30 DIAGNOSIS — J45901 Unspecified asthma with (acute) exacerbation: Secondary | ICD-10-CM

## 2021-10-30 MED ORDER — BUDESONIDE-FORMOTEROL FUMARATE 80-4.5 MCG/ACT IN AERO: INHALATION_SPRAY | RESPIRATORY_TRACT | 3 refills | Status: DC

## 2021-10-30 NOTE — Telephone Encounter (Signed)
Rx sent to pharmacy   

## 2021-10-30 NOTE — Telephone Encounter (Signed)
Pharmacy faxed refill request for  ? ?SYMBICORT 80-4.5 MCG/ACT inhaler [[id number]]  ?  Order Details ?Dose, Route, Frequency: As Directed  ?Dispense Quantity: 10.2 each Refills: 3   ?Note to Pharmacy: DX Code Needed  .  ?     ?Sig: TAKE 2 PUFFS BY MOUTH TWICE A DAY  ?     ?Start Date: 04/07/21 End Date: --  ?Written Date: 04/07/21 Expiration Date: 04/07/22  ? ?

## 2021-12-24 DIAGNOSIS — I10 Essential (primary) hypertension: Secondary | ICD-10-CM | POA: Diagnosis not present

## 2021-12-24 DIAGNOSIS — J0100 Acute maxillary sinusitis, unspecified: Secondary | ICD-10-CM | POA: Diagnosis not present

## 2022-01-07 ENCOUNTER — Ambulatory Visit: Payer: Medicare HMO

## 2022-01-18 DIAGNOSIS — Z681 Body mass index (BMI) 19 or less, adult: Secondary | ICD-10-CM | POA: Diagnosis not present

## 2022-01-18 DIAGNOSIS — J3489 Other specified disorders of nose and nasal sinuses: Secondary | ICD-10-CM | POA: Diagnosis not present

## 2022-01-18 DIAGNOSIS — Z20822 Contact with and (suspected) exposure to covid-19: Secondary | ICD-10-CM | POA: Diagnosis not present

## 2022-01-18 DIAGNOSIS — J0100 Acute maxillary sinusitis, unspecified: Secondary | ICD-10-CM | POA: Diagnosis not present

## 2022-01-18 DIAGNOSIS — Z03818 Encounter for observation for suspected exposure to other biological agents ruled out: Secondary | ICD-10-CM | POA: Diagnosis not present

## 2022-01-18 DIAGNOSIS — I10 Essential (primary) hypertension: Secondary | ICD-10-CM | POA: Diagnosis not present

## 2022-01-18 DIAGNOSIS — R051 Acute cough: Secondary | ICD-10-CM | POA: Diagnosis not present

## 2022-01-18 DIAGNOSIS — R0981 Nasal congestion: Secondary | ICD-10-CM | POA: Diagnosis not present

## 2022-01-21 NOTE — Patient Instructions (Signed)
Krystal Brooks , Thank you for taking time to come for your Medicare Wellness Visit. I appreciate your ongoing commitment to your health goals. Please review the following plan we discussed and let me know if I can assist you in the future.   Screening recommendations/referrals: Colonoscopy: Done 08/30/2012 Repeat in 10 years  Mammogram: No longer required.  Bone Density: Done 05/24/2016 Repeat every 2 years  Recommended yearly ophthalmology/optometry visit for glaucoma screening and checkup Recommended yearly dental visit for hygiene and checkup  Vaccinations: Influenza vaccine: Due Fall 2023. Pneumococcal vaccine: [id number]-41, one dose, obtain at local pharmacy at your convenience. Tdap vaccine: Due every 10 years. Shingles vaccine: Discussed.   Covid-19:Done 08/31/2019, 10/01/2019, 06/10/2020 and 07/15/2021.  Advanced directives: Please bring a copy of your health care power of attorney and living will to the office to be added to your chart at your convenience.   Conditions/risks identified: Aim for 30 minutes of exercise or brisk walking, 6-8 glasses of water, and 5 servings of fruits and vegetables each day.   Next appointment: Follow up in one year for your annual wellness visit 2024.   Preventive Care 61 Years and Older, Female Preventive care refers to lifestyle choices and visits with your health care provider that can promote health and wellness. What does preventive care include? A yearly physical exam. This is also called an annual well check. Dental exams once or twice a year. Routine eye exams. Ask your health care provider how often you should have your eyes checked. Personal lifestyle choices, including: Daily care of your teeth and gums. Regular physical activity. Eating a healthy diet. Avoiding tobacco and drug use. Limiting alcohol use. Practicing safe sex. Taking low-dose aspirin every day. Taking vitamin and mineral supplements as recommended by your health care  provider. What happens during an annual well check? The services and screenings done by your health care provider during your annual well check will depend on your age, overall health, lifestyle risk factors, and family history of disease. Counseling  Your health care provider may ask you questions about your: Alcohol use. Tobacco use. Drug use. Emotional well-being. Home and relationship well-being. Sexual activity. Eating habits. History of falls. Memory and ability to understand (cognition). Work and work Statistician. Reproductive health. Screening  You may have the following tests or measurements: Height, weight, and BMI. Blood pressure. Lipid and cholesterol levels. These may be checked every 5 years, or more frequently if you are over 33 years old. Skin check. Lung cancer screening. You may have this screening every year starting at age 48 if you have a 30-pack-year history of smoking and currently smoke or have quit within the past 15 years. Fecal occult blood test (FOBT) of the stool. You may have this test every year starting at age 38. Flexible sigmoidoscopy or colonoscopy. You may have a sigmoidoscopy every 5 years or a colonoscopy every 10 years starting at age 12. Hepatitis C blood test. Hepatitis B blood test. Sexually transmitted disease (STD) testing. Diabetes screening. This is done by checking your blood sugar (glucose) after you have not eaten for a while (fasting). You may have this done every 1-3 years. Bone density scan. This is done to screen for osteoporosis. You may have this done starting at age 15. Mammogram. This may be done every 1-2 years. Talk to your health care provider about how often you should have regular mammograms. Talk with your health care provider about your test results, treatment options, and if necessary, the need for  more tests. Vaccines  Your health care provider may recommend certain vaccines, such as: Influenza vaccine. This is  recommended every year. Tetanus, diphtheria, and acellular pertussis (Tdap, Td) vaccine. You may need a Td booster every 10 years. Zoster vaccine. You may need this after age 34. Pneumococcal 13-valent conjugate (PCV13) vaccine. One dose is recommended after age 22. Pneumococcal polysaccharide (PPSV23) vaccine. One dose is recommended after age 32. Talk to your health care provider about which screenings and vaccines you need and how often you need them. This information is not intended to replace advice given to you by your health care provider. Make sure you discuss any questions you have with your health care provider. Document Released: 09/12/2015 Document Revised: 05/05/2016 Document Reviewed: 06/17/2015 Elsevier Interactive Patient Education  2017 [place] Prevention in the Home Falls can cause injuries. They can happen to people of all ages. There are many things you can do to make your home safe and to help prevent falls. What can I do on the outside of my home? Regularly fix the edges of walkways and driveways and fix any cracks. Remove anything that might make you trip as you walk through a door, such as a raised step or threshold. Trim any bushes or trees on the path to your home. Use bright outdoor lighting. Clear any walking paths of anything that might make someone trip, such as rocks or tools. Regularly check to see if handrails are loose or broken. Make sure that both sides of any steps have handrails. Any raised decks and porches should have guardrails on the edges. Have any leaves, snow, or ice cleared regularly. Use sand or salt on walking paths during winter. Clean up any spills in your garage right away. This includes oil or grease spills. What can I do in the bathroom? Use night lights. Install grab bars by the toilet and in the tub and shower. Do not use towel bars as grab bars. Use non-skid mats or decals in the tub or shower. If you need to sit down in  the shower, use a plastic, non-slip stool. Keep the floor dry. Clean up any water that spills on the floor as soon as it happens. Remove soap buildup in the tub or shower regularly. Attach bath mats securely with double-sided non-slip rug tape. Do not have throw rugs and other things on the floor that can make you trip. What can I do in the bedroom? Use night lights. Make sure that you have a light by your bed that is easy to reach. Do not use any sheets or blankets that are too big for your bed. They should not hang down onto the floor. Have a firm chair that has side arms. You can use this for support while you get dressed. Do not have throw rugs and other things on the floor that can make you trip. What can I do in the kitchen? Clean up any spills right away. Avoid walking on wet floors. Keep items that you use a lot in easy-to-reach places. If you need to reach something above you, use a strong step stool that has a grab bar. Keep electrical cords out of the way. Do not use floor polish or wax that makes floors slippery. If you must use wax, use non-skid floor wax. Do not have throw rugs and other things on the floor that can make you trip. What can I do with my stairs? Do not leave any items on the stairs. Make  sure that there are handrails on both sides of the stairs and use them. Fix handrails that are broken or loose. Make sure that handrails are as long as the stairways. Check any carpeting to make sure that it is firmly attached to the stairs. Fix any carpet that is loose or worn. Avoid having throw rugs at the top or bottom of the stairs. If you do have throw rugs, attach them to the floor with carpet tape. Make sure that you have a light switch at the top of the stairs and the bottom of the stairs. If you do not have them, ask someone to add them for you. What else can I do to help prevent falls? Wear shoes that: Do not have high heels. Have rubber bottoms. Are comfortable  and fit you well. Are closed at the toe. Do not wear sandals. If you use a stepladder: Make sure that it is fully opened. Do not climb a closed stepladder. Make sure that both sides of the stepladder are locked into place. Ask someone to hold it for you, if possible. Clearly mark and make sure that you can see: Any grab bars or handrails. First and last steps. Where the edge of each step is. Use tools that help you move around (mobility aids) if they are needed. These include: Canes. Walkers. Scooters. Crutches. Turn on the lights when you go into a dark area. Replace any light bulbs as soon as they burn out. Set up your furniture so you have a clear path. Avoid moving your furniture around. If any of your floors are uneven, fix them. If there are any pets around you, be aware of where they are. Review your medicines with your doctor. Some medicines can make you feel dizzy. This can increase your chance of falling. Ask your doctor what other things that you can do to help prevent falls. This information is not intended to replace advice given to you by your health care provider. Make sure you discuss any questions you have with your health care provider. Document Released: 06/12/2009 Document Revised: 01/22/2016 Document Reviewed: 09/20/2014 Elsevier Interactive Patient Education  2017 [employer].

## 2022-01-22 ENCOUNTER — Ambulatory Visit (INDEPENDENT_AMBULATORY_CARE_PROVIDER_SITE_OTHER): Payer: Medicare HMO

## 2022-01-22 VITALS — Ht 61.0 in | Wt 123.0 lb

## 2022-01-22 DIAGNOSIS — Z0000 Encounter for general adult medical examination without abnormal findings: Secondary | ICD-10-CM | POA: Diagnosis not present

## 2022-01-22 NOTE — Progress Notes (Signed)
Subjective:   Krystal Brooks is a 66 y.o. female who presents for Medicare Annual (Subsequent) preventive examination. Virtual Visit via Telephone Note  I connected with  Krystal Brooks on 01/22/22 at  9:00 AM EDT by telephone and verified that I am speaking with the correct person using two identifiers.  Location: Patient: HOME Provider: [facility] Persons participating in the virtual visit: patient/Nurse Health Advisor   I discussed the limitations, risks, security and privacy concerns of performing an evaluation and management service by telephone and the availability of in person appointments. The patient expressed understanding and agreed to proceed.  Interactive audio and video telecommunications were attempted between this nurse and patient, however failed, due to patient having technical difficulties OR patient did not have access to video capability.  We continued and completed visit with audio only.  Some vital signs may be absent or patient reported.   Chriss Driver, LPN  Review of Systems     Cardiac Risk Factors include: advanced age (>30mn, >>66women);hypertension;dyslipidemia;sedentary lifestyle     Objective:    Today's Vitals   01/22/22 0859  Weight: 123 lb (55.8 kg)  Height: '5\' 1"'$  (1.549 m)   Body mass index is 23.24 kg/m.     01/22/2022    9:05 AM 07/29/2021   11:47 AM 03/13/2021    8:40 AM 03/01/2021   10:18 AM 01/01/2021   11:25 AM 07/14/2020   10:30 AM 04/17/2018    9:40 AM  Advanced Directives  Does Patient Have a Medical Advance Directive? Yes No Yes Yes Yes No No  Type of [place]of [place] will   [place] will [place] will    Does patient want to make changes to medical advance directive?     No - Patient declined    Copy of HMysticin Chart? No - copy requested    No - copy requested    Would patient like information on creating a medical  advance directive? No - Patient declined      No - Patient declined    Current Medications (verified) Outpatient Encounter Medications as of 01/22/2022  Medication Sig   albuterol (PROVENTIL) (2.5 MG/3ML) 0.083% nebulizer solution TAKE 3 MLS (2.5 MG TOTAL) BY NEBULIZATION EVERY 6 HOURS AS NEEDED WHEEZING OR SHORTNESS OF BREATH.   albuterol (VENTOLIN HFA) 108 (90 Base) MCG/ACT inhaler INHALE 1-2 PUFFS EVERY 6 HOURS AS NEEDED FOR WHEEZING.   Ascorbic Acid (VITAMIN C) 1000 MG tablet Take 1,000 mg by mouth 2 (two) times daily.    budesonide-formoterol (SYMBICORT) 80-4.5 MCG/ACT inhaler TAKE 2 PUFFS BY MOUTH TWICE A DAY   KLOR-CON M10 10 MEQ tablet TAKE 1 TABLET BY MOUTH EVERY DAY   losartan (COZAAR) 100 MG tablet Take 1 tablet (100 mg total) by mouth daily.   magnesium oxide (MAG-OX) 400 (240 Mg) MG tablet Take 1 tablet by mouth daily.   montelukast (SINGULAIR) 10 MG tablet TAKE 1 TABLET BY MOUTH EVERY DAY   Multiple Vitamin (MULTIVITAMIN WITH MINERALS) TABS tablet Take 1 tablet by mouth daily.   tamoxifen (NOLVADEX) 20 MG tablet Take 1 tablet (20 mg total) by mouth at bedtime.   triamterene-hydrochlorothiazide (MAXZIDE-25) 37.5-25 MG tablet TAKE 1 TABLET BY MOUTH AT BEDTIME. STOP AMLODIPINE   No facility-administered encounter medications on file as of 01/22/2022.    Allergies (verified) Penicillins, Sulfa antibiotics, and Levaquin [levofloxacin]   History: Past Medical History:  Diagnosis Date  Adie's pupil    LEFT EYE   Allergy    Asthma    Atherosclerosis of aorta (HCC)    bulge above renal arteries on mri 2016   Cancer ([place]) 2017   right breast ca   Cellulitis    chest   Complication of anesthesia    VERY HARD TO WAKE UP     Eczema    Hypertension    Personal history of radiation therapy    PONV (postoperative nausea and vomiting)    Smoker    Past Surgical History:  Procedure Laterality Date   ABDOMINAL HYSTERECTOMY     BREAST BIOPSY Right 01/02/2016   BREAST  EXCISIONAL BIOPSY Right 1979   BREAST IMPLANT REMOVAL Bilateral 10/05/2017   Procedure: REMOVALOF BILATERAL BREAST IMPLANTS;  Surgeon: Wallace Going, DO;  Location: [place];  Service: Plastics;  Laterality: Bilateral;   BREAST LUMPECTOMY Right 01/27/2016   BREAST LUMPECTOMY WITH RADIOACTIVE SEED AND SENTINEL LYMPH NODE BIOPSY Right 01/27/2016   Procedure: BREAST LUMPECTOMY WITH RADIOACTIVE SEED AND SENTINEL LYMPH NODE BIOPSY;  Surgeon: Excell Seltzer, MD;  Location: [place];  Service: General;  Laterality: Right;   BREAST RECONSTRUCTION WITH PLACEMENT OF TISSUE EXPANDER AND FLEX HD (ACELLULAR HYDRATED DERMIS) Bilateral 03/16/2017   Procedure: BILATERAL IMMEDIATE BREAST RECONSTRUCTION WITH PLACEMENT OF TISSUE EXPANDER AND FLEX HD (ACELLULAR HYDRATED DERMIS);  Surgeon: Wallace Going, DO;  Location: [place];  Service: Plastics;  Laterality: Bilateral;   BREAST SURGERY  1979   cyst removed rt breast   CATARACT EXTRACTION W/ INTRAOCULAR LENS IMPLANT     RT EYE   CESAREAN SECTION     x2   EXCISION OF BREAST LESION Left 01/30/2018   Procedure: EXCISION OF LEFT BREAST SCAR;  Surgeon: Wallace Going, DO;  Location: [place];  Service: Plastics;  Laterality: Left;   LATISSIMUS FLAP TO BREAST Right 01/30/2018   Procedure: RIGHT LATISSIMUS MYOCUTANEOUS FLAP FOR RIGHT CHEST WALL RECONSTRUCTION;  Surgeon: Wallace Going, DO;  Location: [place];  Service: Plastics;  Laterality: Right;   MASTECTOMY W/ SENTINEL NODE BIOPSY Bilateral 03/16/2017   Procedure: BILATERAL TOTAL MASTECTOMY WITH LEFT SENTINEL LYMPH NODE BIOPSY;  Surgeon: Excell Seltzer, MD;  Location: [place];  Service: General;  Laterality: Bilateral;   REMOVAL OF BILATERAL TISSUE EXPANDERS WITH PLACEMENT OF BILATERAL BREAST IMPLANTS Bilateral 06/15/2017   Procedure: REMOVAL OF BILATERAL TISSUE EXPANDERS WITH PLACEMENT OF BILATERAL BREAST IMPLANTS;  Surgeon: Wallace Going, DO;  Location: [place];  Service: Plastics;  Laterality: Bilateral;   TOTAL MASTECTOMY Bilateral 03/16/2017   SENTINAL NODE BIOPSY   Family History  Problem Relation Age of Onset   Hearing loss Mother    Asthma Father    Cancer Father        melanoma, metastases    Diabetes Father    Heart disease Father    Early death Sister    Early death Paternal Grandfather    Cancer Maternal Grandmother 88       colon cancer    Breast cancer Neg Hx    Ovarian cancer Neg Hx    Social History   Socioeconomic History   Marital status: Married    Spouse name: Not on file   Number of children: Not on file   Years of education: Not on file   Highest education level: Not on file  Occupational History   Not on file  Tobacco Use   Smoking status: Former  Packs/day: 0.50    Years: 40.00    Pack years: 20.00    Types: Cigarettes   Smokeless tobacco: Never  Vaping Use   Vaping Use: Never used  Substance and Sexual Activity   Alcohol use: Yes    Alcohol/week: 0.0 standard drinks    Comment: social   Drug use: No   Sexual activity: Yes    Birth control/protection: Surgical    Comment: hysterectomy  Other Topics Concern   Not on file  Social History Narrative   Three story home   Drinks caffeine   Right handed   Social Determinants of Health   Financial Resource Strain: Low Risk    Difficulty of Paying Living Expenses: Not hard at all  Food Insecurity: No Food Insecurity   Worried About Charity fundraiser in the Last Year: Never true   Ran Out of Food in the Last Year: Never true  Transportation Needs: No Transportation Needs   Lack of Transportation (Medical): No   Lack of Transportation (Non-Medical): No  Physical Activity: Insufficiently Active   Days of Exercise per Week: 3 days   Minutes of Exercise per Session: 30 min  Stress: No Stress Concern Present   Feeling of Stress : Not at all  Social Connections: Socially Integrated   Frequency of Communication with Friends  and Family: More than three times a week   Frequency of Social Gatherings with Friends and Family: More than three times a week   Attends Religious Services: More than 4 times per year   Active Member of [employer] or Organizations: Yes   Attends Music therapist: More than 4 times per year   Marital Status: Married    Tobacco Counseling Counseling given: Not Answered   Clinical Intake:  Pre-visit preparation completed: Yes  Pain : No/denies pain     BMI - recorded: 23.24 Nutritional Status: BMI of 19-24  Normal Nutritional Risks: None Diabetes: No  How often do you need to have someone help you when you read instructions, pamphlets, or other written materials from your doctor or pharmacy?: 1 - Never  Diabetic?NO  Interpreter Needed?: No  Information entered by :: mj Lucresia Simic, lpn   Activities of Daily Living    01/22/2022    9:07 AM  In your present state of health, do you have any difficulty performing the following activities:  Hearing? 0  Vision? 0  Difficulty concentrating or making decisions? 0  Walking or climbing stairs? 0  Dressing or bathing? 0  Doing errands, shopping? 0  Preparing Food and eating ? N  Using the Toilet? N  In the past six months, have you accidently leaked urine? N  Do you have problems with loss of bowel control? N  Managing your Medications? N  Managing your Finances? N  Housekeeping or managing your Housekeeping? N    Patient Care Team: Susy Frizzle, MD as PCP - General (Family Medicine) Excell Seltzer, MD (Inactive) as Consulting Physician (General Surgery) Druscilla Brownie, MD as Consulting Physician (Dermatology) Dillingham, [name], DO as Attending Physician (Plastic Surgery) Magrinat, Virgie Dad, MD (Inactive) as Consulting Physician (Oncology) Delice Bison Charlestine Massed, NP as Nurse Practitioner (Hematology and Oncology) Edythe Clarity, [facility] as Pharmacist (Pharmacist)  Indicate any recent Medical  Services you may have received from other than [facility] providers in the past year (date may be approximate).     Assessment:   This is a routine wellness examination for [facility].  Hearing/Vision screen Hearing Screening -  Comments:: No hearing issues.  Vision Screening - Comments:: Readers. Dr. Katy Fitch. 2022.  Dietary issues and exercise activities discussed: Current Exercise Habits: Home exercise routine, Type of exercise: walking, Time (Minutes): 30, Frequency (Times/Week): 3, Weekly Exercise (Minutes/Week): 90, Intensity: Mild, Exercise limited by: cardiac condition(s)   Goals Addressed             This Visit's Progress    Exercise 150 min/wk Moderate Activity   Not on track      Depression Screen    01/22/2022    9:01 AM 01/01/2021   11:27 AM 07/14/2020   10:30 AM 04/07/2018    9:15 AM 11/24/2017   12:06 PM 05/10/2017    2:28 PM 06/08/2016   10:00 AM  PHQ 2/9 Scores  PHQ - 2 Score 0 0 0 0 0 0 0    Fall Risk    01/22/2022    9:06 AM 03/13/2021    8:40 AM 01/01/2021   11:27 AM 07/14/2020   10:30 AM 04/07/2018    9:15 AM  Fall Risk   Falls in the past year? 0 1 0 0 No  Number falls in past yr: 0 0 0 0   Injury with Fall? 0 0 0 0   Risk for fall due to : No Fall Risks  No Fall Risks    Follow up Falls prevention discussed  Falls evaluation completed;Falls prevention discussed      FALL RISK PREVENTION PERTAINING TO THE HOME:  Any stairs in or around the home? Yes  If so, are there any without handrails? No  Home free of loose throw rugs in walkways, pet beds, electrical cords, etc? Yes  Adequate lighting in your home to reduce risk of falls? Yes   ASSISTIVE DEVICES UTILIZED TO PREVENT FALLS:  Life alert? No  Use of a cane, walker or w/c? No  Grab bars in the bathroom? No  Shower chair or bench in shower? Yes  Elevated toilet seat or a handicapped toilet? Yes   TIMED UP AND GO:  Was the test performed? No .  Phone visit.  Cognitive Function:        01/22/2022     9:08 AM 07/14/2020   10:31 AM  6CIT Screen  What Year? 0 points 0 points  What month? 0 points 0 points  What time? 0 points 0 points  Count back from 20 0 points 0 points  Months in reverse 0 points 0 points  Repeat phrase 0 points 0 points  Total Score 0 points 0 points    Immunizations Immunization History  Administered Date(s) Administered   Influenza-Unspecified 04/28/2017   PFIZER Comirnaty(Gray Top)Covid-19 Tri-Sucrose Vaccine 07/15/2021   PFIZER(Purple Top)SARS-COV-2 Vaccination 06/10/2020    TDAP status: Due, Education has been provided regarding the importance of this vaccine. Advised may receive this vaccine at local pharmacy or Health Dept. Aware to provide a copy of the vaccination record if obtained from local pharmacy or Health Dept. Verbalized acceptance and understanding.  Flu Vaccine status: Declined, Education has been provided regarding the importance of this vaccine but patient still declined. Advised may receive this vaccine at local pharmacy or Health Dept. Aware to provide a copy of the vaccination record if obtained from local pharmacy or Health Dept. Verbalized acceptance and understanding.  Pneumococcal vaccine status: Declined,  Education has been provided regarding the importance of this vaccine but patient still declined. Advised may receive this vaccine at local pharmacy or Health Dept. Aware to provide a copy  of the vaccination record if obtained from local pharmacy or Health Dept. Verbalized acceptance and understanding.   Covid-19 vaccine status: Completed vaccines  Qualifies for Shingles Vaccine? Yes   Zostavax completed No   Shingrix Completed?: No.    Education has been provided regarding the importance of this vaccine. Patient has been advised to call insurance company to determine out of pocket expense if they have not yet received this vaccine. Advised may also receive vaccine at local pharmacy or Health Dept. Verbalized acceptance and  understanding.  Screening Tests Health Maintenance  Topic Date Due   COVID-19 Vaccine (3 - Pfizer risk series) 02/07/2022 (Originally 08/12/2021)   Pneumonia Vaccine 65+ Years old (1 - PCV) 08/27/2022 (Originally 12/22/2019)   Zoster Vaccines- Shingrix (1 of 2) 08/27/2022 (Originally [birth year]21/1974)   TETANUS/TDAP  12/29/2038 (Originally [birth year]/21/1974)   INFLUENZA VACCINE  03/30/2022   COLONOSCOPY (Pts 45-51yr Insurance coverage will need to be confirmed)  08/30/2022   DEXA SCAN  Completed   Hepatitis C Screening  Completed   HPV VACCINES  Aged Out   MAMMOGRAM  Discontinued    Health Maintenance  There are no preventive care reminders to display for this patient.   Colorectal cancer screening: Type of screening: Colonoscopy. Completed 08/30/2012. Repeat every 10 years  Mammogram status: No longer required due to Breast cancer hx..  Bone Density status: Completed 05/24/2016. Results reflect: Bone density results: NORMAL. Repeat every 2 years.  Lung Cancer Screening: (Low Dose CT Chest recommended if Age 67-80years, 30 pack-year currently smoking OR have quit w/in 15years.) does not qualify.   Additional Screening:  Hepatitis C Screening: does qualify; Completed 01/21/2021  Vision Screening: Recommended annual ophthalmology exams for early detection of glaucoma and other disorders of the eye. Is the patient up to date with their annual eye exam?  Yes  Who is the provider or what is the name of the office in which the patient attends annual eye exams? Dr. GKaty FitchIf pt is not established with a provider, would they like to be referred to a provider to establish care? No .   Dental Screening: Recommended annual dental exams for proper oral hygiene  Community Resource Referral / Chronic Care Management: CRR required this visit?  No   CCM required this visit?  No      Plan:     I have personally reviewed and noted the following in the patient's chart:   Medical and social  history Use of alcohol, tobacco or illicit drugs  Current medications and supplements including opioid prescriptions.  Functional ability and status Nutritional status Physical activity Advanced directives List of other physicians Hospitalizations, surgeries, and ER visits in previous 12 months Vitals Screenings to include cognitive, depression, and falls Referrals and appointments  In addition, I have reviewed and discussed with patient certain preventive protocols, quality metrics, and best practice recommendations. A written personalized care plan for preventive services as well as general preventive health recommendations were provided to patient.     MNaketa Daddario LPN   [id number]  Nurse Notes: Discussed Prevar 20 and Shingrix and how to obtain.

## 2022-03-01 ENCOUNTER — Other Ambulatory Visit: Payer: Self-pay

## 2022-03-01 NOTE — Telephone Encounter (Signed)
Pharmacy faxed a refill request for montelukast (SINGULAIR) 10 MG tablet [[id number]]    Order Details Dose, Route, Frequency: As Directed  Dispense Quantity: 30 tablet Refills: 11        Sig: TAKE 1 TABLET BY MOUTH EVERY DAY       Start Date: 02/24/21 End Date: --  Written Date: 02/24/21 Expiration Date: 02/24/22

## 2022-03-03 MED ORDER — MONTELUKAST SODIUM 10 MG PO TABS: 10.0000 mg | ORAL_TABLET | Freq: Every day | ORAL | 11 refills | Status: DC

## 2022-03-03 NOTE — Telephone Encounter (Signed)
Requested Prescriptions  Pending Prescriptions Disp Refills  . montelukast (SINGULAIR) 10 MG tablet 30 tablet 11    Sig: Take 1 tablet (10 mg total) by mouth daily.     Pulmonology:  Leukotriene Inhibitors Passed - 03/01/2022  1:57 PM      Passed - Valid encounter within last 12 months    Recent Outpatient Visits          9 months ago Hypomagnesemia   [place] [place], Cammie Mcgee, MD   1 year ago History of breast cancer in female   [place], Cammie Mcgee, MD   1 year ago History of breast cancer in female   [place], Warren T, MD   2 years ago Exacerbation of asthma, unspecified asthma severity, unspecified whether persistent   [place], [place], FNP   2 years ago Exacerbation of asthma, unspecified asthma severity, unspecified whether persistent   [place], [place], FNP

## 2022-04-08 NOTE — Progress Notes (Deleted)
Chronic Care Management Pharmacy Note  04/08/2022 Name:  Krystal Brooks MRN:  [id number] DOB:  [birth year]  Summary: FU visit - no changes patient doing very well   Subjective: Krystal Brooks is an 66 y.o. year old female who is a primary patient of Pickard, Cammie Mcgee, MD.  The CCM team was consulted for assistance with disease management and care coordination needs.    Engaged with patient by telephone for follow up visit in response to provider referral for pharmacy case management and/or care coordination services.   Consent to Services:  The patient was given the following information about Chronic Care Management services today, agreed to services, and gave verbal consent: 1. CCM service includes personalized support from designated clinical staff supervised by the primary care provider, including individualized plan of care and coordination with other care providers 2. 24/7 contact phone numbers for assistance for urgent and routine care needs. 3. Service will only be billed when office clinical staff spend 20 minutes or more in a month to coordinate care. 4. Only one practitioner may furnish and bill the service in a calendar month. 5.The patient may stop CCM services at any time (effective at the end of the month) by phone call to the office staff. 6. The patient will be responsible for cost sharing (co-pay) of up to 20% of the service fee (after annual deductible is met). Patient agreed to services and consent obtained.  Patient Care Team: Susy Frizzle, MD as PCP - General (Family Medicine) Excell Seltzer, MD (Inactive) as Consulting Physician (General Surgery) Druscilla Brownie, MD as Consulting Physician (Dermatology) Dillingham, [name], DO as Attending Physician (Plastic Surgery) Magrinat, Virgie Dad, MD (Inactive) as Consulting Physician (Oncology) Delice Bison Charlestine Massed, NP as Nurse Practitioner (Hematology and Oncology) Edythe Clarity, [facility] as Pharmacist  (Pharmacist)  Recent office visits:  03/03/21 (Video Visit) Dr. Dennard Schaumann For follow-up. STARTED Magnesium Oxide 400 mg daily and Potassium Chloride Crys ER 10 MEQ Daily.    Recent consult visits:  03/24/21 03/10/21 [name], [name] No information given.  03/18/21 (Telephone) Neurology [name], MD. STARTED Diazepam 5 mg 1 tablet 30 minutes prior to MRI.  03/16/21 Neurology [name], MD. For procedure visit No medication changes. 03/13/21 Neurology [name], MD. For initial visit. No medication changes.  03/10/21 Derm [name] No information given.  02/28/21 [name], [name], NP  For acute Pansinusitis. STARTED Azithromycin 250 mg pack for 5 days and Prednisone 20 mg 2 tablets daily for 5 days .  02/25/21 Derm [name]. [name] No information given.   Hospital visits:  03/01/21 [facility] (2 Hours) For COVID-19. No medication changes.   Objective:  Lab Results  Component Value Date   CREATININE 0.96 07/08/2021   BUN 19 07/08/2021   GFRNONAA >60 07/08/2021   GFRAA >60 06/06/2019   NA 135 07/08/2021   K 3.4 (L) 07/08/2021   CALCIUM 9.2 07/08/2021   CO2 25 07/08/2021   GLUCOSE 116 (H) 07/08/2021    No results found for: "HGBA1C", "FRUCTOSAMINE", "GFR", "MICROALBUR"  Last diabetic Eye exam: No results found for: "HMDIABEYEEXA"  Last diabetic Foot exam: No results found for: "HMDIABFOOTEX"   Lab Results  Component Value Date   CHOL 163 03/25/2021   HDL 58 03/25/2021   LDLCALC 83 03/25/2021   TRIG 122 03/25/2021   CHOLHDL 2.8 03/25/2021       Latest Ref Rng & Units 07/08/2021   11:39 AM 03/25/2021  9:14 AM 03/01/2021   10:50 AM  Hepatic Function  Total Protein 6.5 - 8.1 g/dL 7.6  7.3  7.4   Albumin 3.5 - 5.0 g/dL 3.9   4.0   AST 15 - 41 U/L _0 ALT 0 - 44 U/L _1 Alk Phosphatase 38 - 126 U/L 48   42   Total Bilirubin 0.3 - 1.2 mg/dL 0.3  0.4  0.3     Lab Results  Component Value Date/Time   TSH 0.68  12/01/2017 02:32 PM       Latest Ref Rng & Units 07/08/2021   11:39 AM 03/01/2021   10:50 AM 06/05/2020   11:36 AM  CBC  WBC 4.0 - 10.5 K/uL 7.2  14.1  7.2   Hemoglobin 12.0 - 15.0 g/dL 12.4  12.0  12.1   Hematocrit 36.0 - 46.0 % 38.0  36.4  37.0   Platelets 150 - 400 K/uL 157  232  167     Lab Results  Component Value Date/Time   VD25OH 36.5 06/14/2016 08:28 AM    Clinical ASCVD: No  The 10-year ASCVD risk score (Arnett DK, et al., 2019) is: 11.8%   Values used to calculate the score:     Age: 57 years     Sex: Female     Is Non-Hispanic African American: No     Diabetic: No     Tobacco smoker: No     Systolic Blood Pressure: 017 mmHg     Is BP treated: Yes     HDL Cholesterol: 58 mg/dL     Total Cholesterol: 163 mg/dL       01/22/2022    9:01 AM 01/01/2021   11:27 AM 07/14/2020   10:30 AM  Depression screen PHQ 2/9  Decreased Interest 0 0 0  Down, Depressed, Hopeless 0 0 0  PHQ - 2 Score 0 0 0      Social History   Tobacco Use  Smoking Status Former   Packs/day: 0.50   Years: 40.00   Total pack years: 20.00   Types: Cigarettes  Smokeless Tobacco Never   BP Readings from Last 3 Encounters:  07/08/21 (!) 154/70  05/25/21 128/64  03/13/21 132/74   Pulse Readings from Last 3 Encounters:  07/08/21 90  05/25/21 70  03/13/21 82   Wt Readings from Last 3 Encounters:  01/22/22 123 lb (55.8 kg)  07/08/21 123 lb 8 oz (56 kg)  05/25/21 124 lb (56.2 kg)   BMI Readings from Last 3 Encounters:  01/22/22 23.24 kg/m  07/08/21 23.34 kg/m  05/25/21 23.43 kg/m    Assessment/Interventions: Review of patient past medical history, allergies, medications, health status, including review of consultants reports, laboratory and other test data, was performed as part of comprehensive evaluation and provision of chronic care management services.   SDOH:  (Social Determinants of Health) assessments and interventions performed: Yes  Financial Resource Strain: Low Risk   (01/22/2022)   Overall Financial Resource Strain (CARDIA)    Difficulty of Paying Living Expenses: Not hard at all    SDOH Screenings   Alcohol Screen: Low Risk  (01/22/2022)   Alcohol Screen    Last Alcohol Screening Score (AUDIT): 1  Depression (PHQ2-9): Low Risk  (01/22/2022)   Depression (PHQ2-9)    PHQ-2 Score: 0  Financial Resource Strain: Low Risk  (01/22/2022)   Overall Financial Resource Strain (CARDIA)    Difficulty of Paying Living Expenses:  Not hard at all  Food Insecurity: No Food Insecurity (01/22/2022)   Hunger Vital Sign    Worried About Running Out of Food in the Last Year: Never true    [place] in the Last Year: Never true  Housing: Low Risk  (01/22/2022)   Housing    Last Housing Risk Score: 0  Physical Activity: Insufficiently Active (01/22/2022)   Exercise Vital Sign    Days of Exercise per Week: 3 days    Minutes of Exercise per Session: 30 min  Social Connections: Socially Integrated (01/22/2022)   Social Connection and Isolation Panel [NHANES]    Frequency of Communication with Friends and Family: More than three times a week    Frequency of Social Gatherings with Friends and Family: More than three times a week    Attends Religious Services: More than 4 times per year    Active Member of [employer] or Organizations: Yes    Attends Archivist Meetings: More than 4 times per year    Marital Status: Married  Stress: No Stress Concern Present (01/22/2022)   [employer] [place] of Stress : Not at all  Tobacco Use: Medium Risk (01/22/2022)   Patient History    Smoking Tobacco Use: Former    Smokeless Tobacco Use: Never    Passive Exposure: Not on file  Transportation Needs: No Transportation Needs (01/22/2022)   PRAPARE - Hydrologist (Medical): No    Lack of Transportation (Non-Medical): No    CCM Care Plan  Allergies  Allergen Reactions    Penicillins Swelling, Rash and Other (See Comments)    Older sister died from reaction to penicillin Has patient had a PCN reaction causing immediate rash, facial/tongue/throat swelling, SOB or lightheadedness with hypotension: Yes Has patient had a PCN reaction causing severe rash involving mucus membranes or skin necrosis: No Has patient had a PCN reaction that required hospitalization: No Has patient had a PCN reaction occurring within the last 10 years: No If all of the above answers are "NO", then may proceed with Cephalosporin use.    Sulfa Antibiotics Rash   Levaquin [Levofloxacin] Nausea And Vomiting    Medications Reviewed Today     Reviewed by Stark Bray, PT (Physical Therapist) on 09/28/21 at [phone]  Med List Status: <None>   Medication Order Taking? Sig Documenting Provider Last Dose Status Informant  albuterol (PROVENTIL) (2.5 MG/3ML) 0.083% nebulizer solution [id number]  TAKE 3 MLS (2.5 MG TOTAL) BY NEBULIZATION EVERY 6 HOURS AS NEEDED WHEEZING OR SHORTNESS OF BREATH. Redmond Baseman, Crystal A, FNP  Active   albuterol (VENTOLIN HFA) 108 (90 Base) MCG/ACT inhaler [id number]  INHALE 1-2 PUFFS EVERY 6 HOURS AS NEEDED FOR WHEEZING. Susy Frizzle, MD  Active   Ascorbic Acid (VITAMIN C) 1000 MG tablet [id number]  Take 1,000 mg by mouth 2 (two) times daily.  [provider]  Active Self  KLOR-CON M10 10 MEQ tablet [id number]  TAKE 1 TABLET BY MOUTH EVERY DAY Susy Frizzle, MD  Active   losartan (COZAAR) 100 MG tablet [id number]  Take 1 tablet (100 mg total) by mouth daily. Susy Frizzle, MD  Active   magnesium oxide (MAG-OX) 400 (240 Mg) MG tablet [id number]  Take 1 tablet by mouth daily. [provider]  Active   montelukast (SINGULAIR) 10 MG tablet [id number]  TAKE 1 TABLET BY MOUTH EVERY DAY Pickard, Cammie Mcgee, MD  Active   Multiple Vitamin (MULTIVITAMIN WITH MINERALS) TABS tablet [id number]  Take 1 tablet by mouth daily. [provider]  Active Self   SYMBICORT 80-4.5 MCG/ACT inhaler [id number]  TAKE 2 PUFFS BY MOUTH TWICE A DAY Susy Frizzle, MD  Active   tamoxifen (NOLVADEX) 20 MG tablet [id number]  Take 1 tablet (20 mg total) by mouth at bedtime. Magrinat, Virgie Dad, MD  Active   triamterene-hydrochlorothiazide (MAXZIDE-25) 37.5-25 MG tablet [id number]  TAKE 1 TABLET BY MOUTH AT BEDTIME. STOP AMLODIPINE Susy Frizzle, MD  Active             Patient Active Problem List   Diagnosis Date Noted   Acquired absence of right breast 01/30/2018   Status post bilateral breast reconstruction 06/24/2017   Acquired absence of bilateral breasts and nipples 03/22/2017   Breast cancer ([place]) 03/16/2017   History of breast cancer in female 01/27/2017   Malignant neoplasm of upper-inner quadrant of left breast in female, estrogen receptor positive ([place]) 01/03/2017   HTN (hypertension) 06/14/2016   Malignant neoplasm of lower-inner quadrant of right breast of female, estrogen receptor positive ([place]) 01/07/2016   Atherosclerosis of aorta ([place])    Smoker     Immunization History  Administered Date(s) Administered   Influenza-Unspecified 04/28/2017   PFIZER Comirnaty(Gray Top)Covid-19 Tri-Sucrose Vaccine 07/15/2021   PFIZER(Purple Top)SARS-COV-2 Vaccination 06/10/2020    Conditions to be addressed/monitored:  HTN, Breast cancer, HLD?  There are no care plans that you recently modified to display for this patient.      Medication Assistance: None required.  Patient affirms current coverage meets needs.  Compliance/Adherence/Medication fill history: Care Gaps: Shingrix Pneumovax  Star-Rating Drugs: None  Patient's preferred pharmacy is:  [id number] [place], [place] - [address] [place] [address]Phone: [phone]: [phone] [id number] [address] [place]: [phone]: [phone]  [id number] [place] [id number]: [phone]: [phone] pill box? No - takes them out of vials Pt endorses 100% compliance  We discussed: Benefits of medication synchronization, packaging and delivery as well as enhanced pharmacist oversight with [employer]. Patient decided to: Continue current medication management strategy  Care Plan and Follow Up Patient Decision:  Patient agrees to Care Plan and Follow-up.  Plan: The care management team will reach out to the patient again over the next 180 days.  ChrBeverly MilchharmD Clinical Pharmacist [phone] Barriers:  None identified at this visit  Pharmacist Clinical Goal(s):  Patient will maintain control of BP as evidenced by home monitoring  through collaboration with PharmD and provider.   Interventions: 1:1 collaboration with PicSusy FrizzleD regarding development and update of comprehensive plan of care as evidenced by provider attestation and co-signature Inter-disciplinary care team collaboration (see longitudinal plan of care) Comprehensive medication review performed; medication list updated in electronic medical record  Hypertension (BP goal <140/90) -Controlled -Current treatment: Triamterene/HCTZ 37.5-25mg daily Losartan 100m69mily -Medications previously tried: amlodipine  -Current home readings: not checking -Current exercise habits: walking a few times weekly -Denies hypotensive/hypertensive symptoms -Educated on BP goals and benefits of medications for prevention of  heart attack, stroke and kidney damage; Daily salt intake goal < 2300 mg; Exercise goal of 150 minutes per week; Importance of home blood pressure monitoring; Symptoms of hypotension and importance of  maintaining adequate hydration; -Counseled to monitor BP at home if symptomatic, document, and provide log at future appointments -Recommended to continue current medication  Update 08/20/21 No changes to BP meds since last visit.  Denies any dizziness or HA at home.  Most recent office BP was slightly elevated, this was however at her cancer doc so possible some stress related to that visit.  No symptoms of hypertension at home. Continue current meds Has lots of family coming in for Christmas and plans to enjoy that!   Hyperlipidemia: (LDL goal < 100) -Controlled, last known lipid panel in 2016 -Current treatment: None -Medications previously tried: none noted  -Current exercise habits: occasional walking -Educated on Cholesterol goals;  Recommended lipid panel at next CPE appointment in October. -Recommended continue current meds and make appt for physical in October.  Breast Cancer  -Controlled -Current treatment  Tamoxifen 4m daily -Medications previously tried: none noted -Sees oncologist regularly  -Recommended to continue current medication  Asthma? (Goal: minimize symptoms) -Controlled -Current treatment  Montelukast 154mdaily Symbicort 80-4.26m8m2 puffs bid Albuterol HFA 90 mcg prn Albuterol 0.083% nebs prn -Medications previously tried: none noted -Reports consistent use of maintenance inhaler, Symbicort -Uses albuterol infrequently -Denies any SOB or wheezing recently  -Recommended to continue current medication Assessed patient finances. Inhalers are current all affordable and patient has no concerns with obtaining them.  Update 08/20/21 Continues to rarely need her rescue inhaler. Does report that she uses her Symbicort daily for maintenance. No SOB recently. Copay on Symbicort is high, however she still is able to afford it and does not have to go without meds. No changes needed - continue current meds.  Patient Goals/Self-Care Activities Patient  will:  - take medications as prescribed focus on medication adherence by pill count check blood pressure when sypmtomatic, document, and provide at future appointments  Follow Up Plan: The care management team will reach out to the patient again over the next 180 days.

## 2022-04-22 ENCOUNTER — Telehealth: Payer: Self-pay

## 2022-05-06 NOTE — Progress Notes (Signed)
Chronic Care Management Pharmacy Note  05/13/2022 Name:  Krystal Brooks MRN:  [id number] DOB:  [birth year]  Summary: FU visit - imaging scheduled for Friday.  She had a flare up yesterday.  Says she is taking about 8 advil per day to get through the pain.  No changes at this time pending imaging   Subjective: Krystal Brooks is an 66 y.o. year old female who is a primary patient of Pickard, Cammie Mcgee, MD.  The CCM team was consulted for assistance with disease management and care coordination needs.    Engaged with patient by telephone for follow up visit in response to provider referral for pharmacy case management and/or care coordination services.   Consent to Services:  The patient was given the following information about Chronic Care Management services today, agreed to services, and gave verbal consent: 1. CCM service includes personalized support from designated clinical staff supervised by the primary care provider, including individualized plan of care and coordination with other care providers 2. 24/7 contact phone numbers for assistance for urgent and routine care needs. 3. Service will only be billed when office clinical staff spend 20 minutes or more in a month to coordinate care. 4. Only one practitioner may furnish and bill the service in a calendar month. 5.The patient may stop CCM services at any time (effective at the end of the month) by phone call to the office staff. 6. The patient will be responsible for cost sharing (co-pay) of up to 20% of the service fee (after annual deductible is met). Patient agreed to services and consent obtained.  Patient Care Team: Susy Frizzle, MD as PCP - General (Family Medicine) Excell Seltzer, MD (Inactive) as Consulting Physician (General Surgery) Druscilla Brownie, MD as Consulting Physician (Dermatology) Dillingham, [name], DO as Attending Physician (Plastic Surgery) Magrinat, Virgie Dad, MD (Inactive) as Consulting Physician  (Oncology) Delice Bison Charlestine Massed, NP as Nurse Practitioner (Hematology and Oncology) Edythe Clarity, [facility] as Pharmacist (Pharmacist)  Recent office visits:  03/03/21 (Video Visit) Dr. Dennard Schaumann For follow-up. STARTED Magnesium Oxide 400 mg daily and Potassium Chloride Crys ER 10 MEQ Daily.    Recent consult visits:  03/24/21 03/10/21 [name], [name] No information given.  03/18/21 (Telephone) Neurology [name], MD. STARTED Diazepam 5 mg 1 tablet 30 minutes prior to MRI.  03/16/21 Neurology [name], MD. For procedure visit No medication changes. 03/13/21 Neurology [name], MD. For initial visit. No medication changes.  03/10/21 Derm [name] No information given.  02/28/21 [name], [name], NP  For acute Pansinusitis. STARTED Azithromycin 250 mg pack for 5 days and Prednisone 20 mg 2 tablets daily for 5 days .  02/25/21 Derm [name]. [name] No information given.   Hospital visits:  03/01/21 [facility] (2 Hours) For COVID-19. No medication changes.   Objective:  Lab Results  Component Value Date   CREATININE 1.02 05/11/2022   BUN 17 05/11/2022   GFRNONAA >60 07/08/2021   GFRAA >60 06/06/2019   NA 136 05/11/2022   K 3.6 05/11/2022   CALCIUM 9.4 05/11/2022   CO2 28 05/11/2022   GLUCOSE 123 (H) 05/11/2022    No results found for: "HGBA1C", "FRUCTOSAMINE", "GFR", "MICROALBUR"  Last diabetic Eye exam: No results found for: "HMDIABEYEEXA"  Last diabetic Foot exam: No results found for: "HMDIABFOOTEX"   Lab Results  Component Value Date   CHOL 159 05/11/2022   HDL 55 05/11/2022   LDLCALC 83 05/11/2022  TRIG 113 05/11/2022   CHOLHDL 2.9 05/11/2022       Latest Ref Rng & Units 05/11/2022    8:14 AM 07/08/2021   11:39 AM 03/25/2021    9:14 AM  Hepatic Function  Total Protein 6.1 - 8.1 g/dL 7.5  7.6  7.3   Albumin 3.5 - 5.0 g/dL  3.9    AST 10 - 35 U/L _0 ALT 6 - 29 U/L _1 Alk Phosphatase 38 - 126 U/L   48    Total Bilirubin 0.2 - 1.2 mg/dL 0.3  0.3  0.4     Lab Results  Component Value Date/Time   TSH 0.68 12/01/2017 02:32 PM       Latest Ref Rng & Units 07/08/2021   11:39 AM 03/01/2021   10:50 AM 06/05/2020   11:36 AM  CBC  WBC 4.0 - 10.5 K/uL 7.2  14.1  7.2   Hemoglobin 12.0 - 15.0 g/dL 12.4  12.0  12.1   Hematocrit 36.0 - 46.0 % 38.0  36.4  37.0   Platelets 150 - 400 K/uL 157  232  167     Lab Results  Component Value Date/Time   VD25OH 36.5 06/14/2016 08:28 AM    Clinical ASCVD: No  The 10-year ASCVD risk score (Arnett DK, et al., 2019) is: 7.1%   Values used to calculate the score:     Age: 1 years     Sex: Female     Is Non-Hispanic African American: No     Diabetic: No     Tobacco smoker: No     Systolic Blood Pressure: 076 mmHg     Is BP treated: Yes     HDL Cholesterol: 55 mg/dL     Total Cholesterol: 159 mg/dL       05/11/2022    8:01 AM 01/22/2022    9:01 AM 01/01/2021   11:27 AM  Depression screen PHQ 2/9  Decreased Interest 0 0 0  Down, Depressed, Hopeless 0 0 0  PHQ - 2 Score 0 0 0      Social History   Tobacco Use  Smoking Status Former   Packs/day: 0.50   Years: 40.00   Total pack years: 20.00   Types: Cigarettes  Smokeless Tobacco Never   BP Readings from Last 3 Encounters:  05/11/22 118/64  07/08/21 (!) 154/70  05/25/21 128/64   Pulse Readings from Last 3 Encounters:  05/11/22 96  07/08/21 90  05/25/21 70   Wt Readings from Last 3 Encounters:  05/11/22 118 lb 9.6 oz (53.8 kg)  01/22/22 123 lb (55.8 kg)  07/08/21 123 lb 8 oz (56 kg)   BMI Readings from Last 3 Encounters:  05/11/22 22.41 kg/m  01/22/22 23.24 kg/m  07/08/21 23.34 kg/m    Assessment/Interventions: Review of patient past medical history, allergies, medications, health status, including review of consultants reports, laboratory and other test data, was performed as part of comprehensive evaluation and provision of chronic care management services.   SDOH:   (Social Determinants of Health) assessments and interventions performed: No, done within year Financial Resource Strain: Low Risk  (01/22/2022)   Overall Financial Resource Strain (CARDIA)    Difficulty of Paying Living Expenses: Not hard at all    SDOH Interventions    Flowsheet Row Clinical Support from 01/22/2022 in [place] from 01/01/2021 in [place] Interventions  Food Insecurity Interventions Intervention Not Indicated Intervention Not Indicated  Housing Interventions Intervention Not Indicated Intervention Not Indicated  Transportation Interventions Intervention Not Indicated Intervention Not Indicated  Financial Strain Interventions Intervention Not Indicated Intervention Not Indicated  Physical Activity Interventions Other (Comments)  [try to increase as tolerated.] Intervention Not Indicated  Stress Interventions Intervention Not Indicated Intervention Not Indicated  Social Connections Interventions Intervention Not Indicated Intervention Not Indicated      Financial Resource Strain: Low Risk  (01/22/2022)   Overall Financial Resource Strain (CARDIA)    Difficulty of Paying Living Expenses: Not hard at all    [place]: No Food Insecurity (01/22/2022)  Housing: Low Risk  (01/22/2022)  Transportation Needs: No Transportation Needs (01/22/2022)  Alcohol Screen: Low Risk  (01/22/2022)  Depression (PHQ2-9): Low Risk  (05/11/2022)  Financial Resource Strain: Low Risk  (01/22/2022)  Physical Activity: Insufficiently Active (01/22/2022)  Social Connections: Socially Integrated (01/22/2022)  Stress: No Stress Concern Present (01/22/2022)  Tobacco Use: Medium Risk (01/22/2022)    CCM Care Plan  Allergies  Allergen Reactions   Penicillins Swelling, Rash and Other (See Comments)    Older sister died from reaction to penicillin Has patient had a PCN reaction causing immediate rash,  facial/tongue/throat swelling, SOB or lightheadedness with hypotension: Yes Has patient had a PCN reaction causing severe rash involving mucus membranes or skin necrosis: No Has patient had a PCN reaction that required hospitalization: No Has patient had a PCN reaction occurring within the last 10 years: No If all of the above answers are "NO", then may proceed with Cephalosporin use.    Sulfa Antibiotics Rash   Levaquin [Levofloxacin] Nausea And Vomiting    Medications Reviewed Today     Reviewed by Edythe Clarity, [facility] (Pharmacist) on 05/13/22 at 1154  Med List Status: <None>   Medication Order Taking? Sig Documenting Provider Last Dose Status Informant  albuterol (PROVENTIL) (2.5 MG/3ML) 0.083% nebulizer solution [id number] Yes TAKE 3 MLS (2.5 MG TOTAL) BY NEBULIZATION EVERY 6 HOURS AS NEEDED WHEEZING OR SHORTNESS OF BREATH. Redmond Baseman, Crystal A, FNP Taking Active   albuterol (VENTOLIN HFA) 108 (90 Base) MCG/ACT inhaler [id number] Yes INHALE 1-2 PUFFS EVERY 6 HOURS AS NEEDED FOR WHEEZING. Susy Frizzle, MD Taking Active   Ascorbic Acid (VITAMIN C) 1000 MG tablet [id number] Yes Take 1,000 mg by mouth 2 (two) times daily.  [provider] Taking Active Self  budesonide-formoterol (SYMBICORT) 80-4.5 MCG/ACT inhaler [id number] Yes TAKE 2 PUFFS BY MOUTH TWICE A DAY Susy Frizzle, MD Taking Active   KLOR-CON M10 10 MEQ tablet [id number] Yes TAKE 1 TABLET BY MOUTH EVERY DAY Susy Frizzle, MD Taking Active   losartan (COZAAR) 100 MG tablet [id number] Yes Take 1 tablet (100 mg total) by mouth daily. Susy Frizzle, MD Taking Active   magnesium oxide (MAG-OX) 400 (240 Mg) MG tablet [id number] Yes Take 1 tablet by mouth daily. [provider] Taking Active   montelukast (SINGULAIR) 10 MG tablet [id number] Yes Take 1 tablet (10 mg total) by mouth daily. Susy Frizzle, MD Taking Active   Multiple Vitamin (MULTIVITAMIN WITH MINERALS) TABS tablet [id number] Yes Take 1 tablet by  mouth daily. [provider] Taking Active Self  predniSONE (DELTASONE) 20 MG tablet [id number] Yes 3 tabs poqday 1-2, 2 tabs poqday 3-4, 1 tab poqday 5-6 Susy Frizzle, MD Taking Active   tamoxifen (NOLVADEX) 20 MG tablet [id number] Yes Take 1 tablet (20 mg total) by mouth at bedtime.  Magrinat, Virgie Dad, MD Taking Active   triamterene-hydrochlorothiazide [facility]) 37.5-25 MG tablet [id number] Yes TAKE 1 TABLET BY MOUTH AT BEDTIME. STOP AMLODIPINE Susy Frizzle, MD Taking Active             Patient Active Problem List   Diagnosis Date Noted   Acquired absence of right breast 01/30/2018   Status post bilateral breast reconstruction 06/24/2017   Acquired absence of bilateral breasts and nipples 03/22/2017   Breast cancer ([place]) 03/16/2017   History of breast cancer in female 01/27/2017   Malignant neoplasm of upper-inner quadrant of left breast in female, estrogen receptor positive ([place]) 01/03/2017   HTN (hypertension) 06/14/2016   Malignant neoplasm of lower-inner quadrant of right breast of female, estrogen receptor positive ([place]) 01/07/2016   Atherosclerosis of aorta ([place])    Smoker     Immunization History  Administered Date(s) Administered   Influenza-Unspecified 04/28/2017   PFIZER Comirnaty(Gray Top)Covid-19 Tri-Sucrose Vaccine 07/15/2021   PFIZER(Purple Top)SARS-COV-2 Vaccination 06/10/2020    Conditions to be addressed/monitored:  HTN, Breast cancer, HLD?  Care Plan : General Pharmacy (Adult)  Updates made by Edythe Clarity, [facility] since 05/13/2022 12:00 AM     Problem: HTN, Breast cancer, HLD   Priority: High  Onset Date: 02/13/2021     Goal: Patient-Specific Goal   Note:   Current Barriers:  None identified at this visit  Pharmacist Clinical Goal(s):  Patient will maintain control of BP as evidenced by home monitoring  through collaboration with PharmD and provider.   Interventions: 1:1 collaboration with Susy Frizzle, MD regarding  development and update of comprehensive plan of care as evidenced by provider attestation and co-signature Inter-disciplinary care team collaboration (see longitudinal plan of care) Comprehensive medication review performed; medication list updated in electronic medical record  Hypertension (BP goal <140/90) 05/13/22 -Controlled -Current treatment: Triamterene/HCTZ 37.5-25mg daily Appropriate, Effective, Safe, Accessible Losartan 181m daily Appropriate, Effective, Safe, Accessible -Medications previously tried: amlodipine  -Current home readings: not checking -Current exercise habits: walking a few times weekly -Denies hypotensive/hypertensive symptoms -Educated on BP goals and benefits of medications for prevention of heart attack, stroke and kidney damage; Daily salt intake goal < 2300 mg; Exercise goal of 150 minutes per week; Importance of home blood pressure monitoring; BP controlled, last in office visit showed well controlled. No issues with meds, adherence 100%  Update 08/20/21 No changes to BP meds since last visit.  Denies any dizziness or HA at home.  Most recent office BP was slightly elevated, this was however at her cancer doc so possible some stress related to that visit.  No symptoms of hypertension at home. Continue current meds Has lots of family coming in for Christmas and plans to enjoy that!   Hyperlipidemia: (LDL goal < 100) -Controlled, last known lipid panel in 2016 -Current treatment: None -Medications previously tried: none noted  -Current exercise habits: occasional walking -Educated on Cholesterol goals;  Recommended lipid panel at next CPE appointment in October. -Recommended continue current meds and make appt for physical in October.  Breast Cancer  -Controlled, not assessed -Current treatment  Tamoxifen 267mdaily -Medications previously tried: none noted -Sees oncologist regularly  -Recommended to continue current medication  Asthma?  (Goal: minimize symptoms) 05/13/22 -Controlled -Current treatment  Montelukast 1026maily Appropriate, Effective, Safe, Accessible Symbicort 80-4.5mc85m puffs bid Appropriate, Effective, Safe, Accessible Albuterol HFA 90 mcg prn Appropriate, Effective, Safe, Accessible Albuterol 0.083% nebs prn Appropriate, Effective, Safe, Accessible -Medications previously tried: none noted -Reports consistent use  of maintenance inhaler, Symbicort -Uses albuterol infrequently -Denies any SOB or wheezing recently  -Recommended to continue current medication -Copays are still affordable, no changes at this time.  She does request refills on Symbicort and Albuterol.  Will coordinate with PCP team.  Update 08/20/21 Continues to rarely need her rescue inhaler. Does report that she uses her Symbicort daily for maintenance. No SOB recently. Copay on Symbicort is high, however she still is able to afford it and does not have to go without meds. No changes needed - continue current meds.  Patient Goals/Self-Care Activities Patient will:  - take medications as prescribed focus on medication adherence by pill count check blood pressure when sypmtomatic, document, and provide at future appointments  Follow Up Plan: The care management team will reach out to the patient again over the next 365 days.               Medication Assistance: None required.  Patient affirms current coverage meets needs.  Compliance/Adherence/Medication fill history: Care Gaps: Shingrix Pneumovax  Star-Rating Drugs: Losartan 159m 02/19/22 90ds  Patient's preferred pharmacy is:  [id number] [place], [place] - [address] [place] [id number]: [phone]: [id number] [place] [place]: [phone]:  [phone]  [id number] [id number]: [phone]: [phone] pill box? No - takes them out of vials Pt endorses 100% compliance  We discussed: Benefits of medication synchronization, packaging and delivery as well as enhanced pharmacist oversight with [employer]. Patient decided to: Continue current medication management strategy  Care Plan and Follow Up Patient Decision:  Patient agrees to Care Plan and Follow-up.  Plan: The care management team will reach out to the patient again over the next 180 days.  ChriBeverly MilcharmD Clinical Pharmacist [phone]

## 2022-05-11 ENCOUNTER — Ambulatory Visit (INDEPENDENT_AMBULATORY_CARE_PROVIDER_SITE_OTHER): Payer: Medicare HMO | Admitting: Family Medicine

## 2022-05-11 DIAGNOSIS — I10 Essential (primary) hypertension: Secondary | ICD-10-CM

## 2022-05-11 DIAGNOSIS — M5432 Sciatica, left side: Secondary | ICD-10-CM

## 2022-05-11 MED ORDER — PREDNISONE 20 MG PO TABS: ORAL_TABLET | ORAL | 0 refills | Status: DC

## 2022-05-11 NOTE — Progress Notes (Signed)
Subjective:    Patient ID: Krystal Brooks, female    DOB: 1955/09/29, 66 y.o.   MRN: [id number]  HPI Over the summer, the patient was watching a lot of baseball games from hard bleachers seating.  She believes that her back.  Starting about 4 to 6 weeks ago she developed left-sided sciatica.  It would radiate from her left gluteus down her left leg.  She was taking a lot of Advil but gradually the pain started to subside.  The sciatica has since subsided but now she is having central low back pain roughly around the level of L4.  She is tender to palpation in that area.  She states that whenever she stands up from a seated position she feels a pulling sensation in that area.  Usually as long as she sitting still her back feels fine.  She denies any bowel or bladder incontinence or leg weakness or saddle anesthesia Past Medical History:  Diagnosis Date   Adie's pupil    LEFT EYE   Allergy    Asthma    Atherosclerosis of aorta (HCC)    bulge above renal arteries on mri 2016   Cancer ([place]) 2017   right breast ca   Cellulitis    chest   Complication of anesthesia    VERY HARD TO WAKE UP     Eczema    Hypertension    Personal history of radiation therapy    PONV (postoperative nausea and vomiting)    Smoker    Past Surgical History:  Procedure Laterality Date   ABDOMINAL HYSTERECTOMY     BREAST BIOPSY Right 01/02/2016   BREAST EXCISIONAL BIOPSY Right 1979   BREAST IMPLANT REMOVAL Bilateral 10/05/2017   Procedure: REMOVALOF BILATERAL BREAST IMPLANTS;  Surgeon: Wallace Going, DO;  Location: [facility];  Service: Plastics;  Laterality: Bilateral;   BREAST LUMPECTOMY Right 01/27/2016   BREAST LUMPECTOMY WITH RADIOACTIVE SEED AND SENTINEL LYMPH NODE BIOPSY Right 01/27/2016   Procedure: BREAST LUMPECTOMY WITH RADIOACTIVE SEED AND SENTINEL LYMPH NODE BIOPSY;  Surgeon: Excell Seltzer, MD;  Location: [facility];  Service: General;  Laterality: Right;    BREAST RECONSTRUCTION WITH PLACEMENT OF TISSUE EXPANDER AND FLEX HD (ACELLULAR HYDRATED DERMIS) Bilateral 03/16/2017   Procedure: BILATERAL IMMEDIATE BREAST RECONSTRUCTION WITH PLACEMENT OF TISSUE EXPANDER AND FLEX HD (ACELLULAR HYDRATED DERMIS);  Surgeon: Wallace Going, DO;  Location: [facility];  Service: Plastics;  Laterality: Bilateral;   BREAST SURGERY  1979   cyst removed rt breast   CATARACT EXTRACTION W/ INTRAOCULAR LENS IMPLANT     RT EYE   CESAREAN SECTION     x2   EXCISION OF BREAST LESION Left 01/30/2018   Procedure: EXCISION OF LEFT BREAST SCAR;  Surgeon: Wallace Going, DO;  Location: [facility];  Service: Plastics;  Laterality: Left;   LATISSIMUS FLAP TO BREAST Right 01/30/2018   Procedure: RIGHT LATISSIMUS MYOCUTANEOUS FLAP FOR RIGHT CHEST WALL RECONSTRUCTION;  Surgeon: Wallace Going, DO;  Location: [facility];  Service: Plastics;  Laterality: Right;   MASTECTOMY W/ SENTINEL NODE BIOPSY Bilateral 03/16/2017   Procedure: BILATERAL TOTAL MASTECTOMY WITH LEFT SENTINEL LYMPH NODE BIOPSY;  Surgeon: Excell Seltzer, MD;  Location: [facility];  Service: General;  Laterality: Bilateral;   REMOVAL OF BILATERAL TISSUE EXPANDERS WITH PLACEMENT OF BILATERAL BREAST IMPLANTS Bilateral 06/15/2017   Procedure: REMOVAL OF BILATERAL TISSUE EXPANDERS WITH PLACEMENT OF BILATERAL BREAST IMPLANTS;  Surgeon: Wallace Going, DO;  Location: [facility];  Service: Plastics;  Laterality: Bilateral;   TOTAL MASTECTOMY Bilateral 03/16/2017   SENTINAL NODE BIOPSY    Current Outpatient Medications on File Prior to Visit  Medication Sig Dispense Refill   albuterol (PROVENTIL) (2.5 MG/3ML) 0.083% nebulizer solution TAKE 3 MLS (2.5 MG TOTAL) BY NEBULIZATION EVERY 6 HOURS AS NEEDED WHEEZING OR SHORTNESS OF BREATH. 300 mL 5   albuterol (VENTOLIN HFA) 108 (90 Base) MCG/ACT inhaler INHALE 1-2 PUFFS EVERY 6 HOURS AS NEEDED FOR WHEEZING. 18 each 5   Ascorbic Acid (VITAMIN C) 1000 MG tablet Take  1,000 mg by mouth 2 (two) times daily.      budesonide-formoterol (SYMBICORT) 80-4.5 MCG/ACT inhaler TAKE 2 PUFFS BY MOUTH TWICE A DAY 10.2 each 3   KLOR-CON M10 10 MEQ tablet TAKE 1 TABLET BY MOUTH EVERY DAY 30 tablet 1   losartan (COZAAR) 100 MG tablet Take 1 tablet (100 mg total) by mouth daily. 90 tablet 3   magnesium oxide (MAG-OX) 400 (240 Mg) MG tablet Take 1 tablet by mouth daily.     montelukast (SINGULAIR) 10 MG tablet Take 1 tablet (10 mg total) by mouth daily. 30 tablet 11   Multiple Vitamin (MULTIVITAMIN WITH MINERALS) TABS tablet Take 1 tablet by mouth daily.     tamoxifen (NOLVADEX) 20 MG tablet Take 1 tablet (20 mg total) by mouth at bedtime. 90 tablet 4   triamterene-hydrochlorothiazide (MAXZIDE-25) 37.5-25 MG tablet TAKE 1 TABLET BY MOUTH AT BEDTIME. STOP AMLODIPINE 90 tablet 3   No current facility-administered medications on file prior to visit.   Allergies  Allergen Reactions   Penicillins Swelling, Rash and Other (See Comments)    Older sister died from reaction to penicillin Has patient had a PCN reaction causing immediate rash, facial/tongue/throat swelling, SOB or lightheadedness with hypotension: Yes Has patient had a PCN reaction causing severe rash involving mucus membranes or skin necrosis: No Has patient had a PCN reaction that required hospitalization: No Has patient had a PCN reaction occurring within the last 10 years: No If all of the above answers are "NO", then may proceed with Cephalosporin use.    Sulfa Antibiotics Rash   Levaquin [Levofloxacin] Nausea And Vomiting   Social History   Socioeconomic History   Marital status: Married    Spouse name: Not on file   Number of children: Not on file   Years of education: Not on file   Highest education level: Not on file  Occupational History   Not on file  Tobacco Use   Smoking status: Former    Packs/day: 0.50    Years: 40.00    Total pack years: 20.00    Types: Cigarettes   Smokeless tobacco:  Never  Vaping Use   Vaping Use: Never used  Substance and Sexual Activity   Alcohol use: Yes    Alcohol/week: 0.0 standard drinks of alcohol    Comment: social   Drug use: No   Sexual activity: Yes    Birth control/protection: Surgical    Comment: hysterectomy  Other Topics Concern   Not on file  Social History Narrative   Three story home   Drinks caffeine   Right handed   Social Determinants of Health   Financial Resource Strain: Low Risk  (01/22/2022)   Overall Financial Resource Strain (CARDIA)    Difficulty of Paying Living Expenses: Not hard at all  Food Insecurity: No Food Insecurity (01/22/2022)   Hunger Vital Sign    Worried About Running Out of Food  in the Last Year: Never true    [place] in the Last Year: Never true  Transportation Needs: No Transportation Needs (01/22/2022)   PRAPARE - Hydrologist (Medical): No    Lack of Transportation (Non-Medical): No  Physical Activity: Insufficiently Active (01/22/2022)   Exercise Vital Sign    Days of Exercise per Week: 3 days    Minutes of Exercise per Session: 30 min  Stress: No Stress Concern Present (01/22/2022)   [place]    Feeling of Stress : Not at all  Social Connections: [place] (01/22/2022)   Social Connection and Isolation Panel [NHANES]    Frequency of Communication with Friends and Family: More than three times a week    Frequency of Social Gatherings with Friends and Family: More than three times a week    Attends Religious Services: More than 4 times per year    Active Member of [employer] or Organizations: Yes    Attends Archivist Meetings: More than 4 times per year    Marital Status: Married  Human resources officer Violence: Not At Risk (01/22/2022)   Humiliation, Afraid, Rape, and Kick questionnaire    Fear of Current or Ex-Partner: No    Emotionally Abused: No    Physically Abused: No     Sexually Abused: No      Review of Systems  All other systems reviewed and are negative.      Objective:   Physical Exam Vitals reviewed.  Constitutional:      Appearance: Normal appearance.  Cardiovascular:     Rate and Rhythm: Normal rate and regular rhythm.     Heart sounds: Normal heart sounds.  Pulmonary:     Effort: Pulmonary effort is normal.     Breath sounds: Normal breath sounds.  Musculoskeletal:     Lumbar back: Tenderness and bony tenderness present. Decreased range of motion. Negative right straight leg raise test and negative left straight leg raise test.       Back:  Neurological:     Mental Status: She is alert.           Assessment & Plan:  Hypomagnesemia - Plan: Magnesium, Lipid panel, COMPLETE METABOLIC PANEL WITH GFR  Benign essential HTN - Plan: Magnesium, Lipid panel, COMPLETE METABOLIC PANEL WITH GFR  Left sided sciatica - Plan: DG Lumbar Spine Complete Given the patient's history of breast cancer I would like to obtain an x-ray of the lumbar spine.  I suspect degenerative disc disease.  We will give the patient prednisone to try to expedite the healing process.  As long as the x-ray is benign, I would allow another 2 to 3 weeks to improve.  While the patient is here today I would like to repeat her magnesium and potassium levels get fasting lipid panel.  The patient had seizures last year due to hypokalemia, hypomagnesemia, and COVID.  Therefore just follow-up to see what her electrolytes levels are.

## 2022-05-13 ENCOUNTER — Ambulatory Visit: Payer: Medicare HMO | Admitting: Pharmacist

## 2022-05-13 DIAGNOSIS — J45909 Unspecified asthma, uncomplicated: Secondary | ICD-10-CM

## 2022-05-13 DIAGNOSIS — I10 Essential (primary) hypertension: Secondary | ICD-10-CM

## 2022-05-13 NOTE — Patient Instructions (Addendum)
Visit Information   Goals Addressed             This Visit's Progress    Manage My Medicine   On track    Timeframe:  Long-Range Goal Priority:  High Start Date: 02/13/21                            Expected End Date:  08/15/21                     Follow Up Date 05/29/21    - call for medicine refill 2 or 3 days before it runs out - call if I am sick and can't take my medicine - keep a list of all the medicines I take; vitamins and herbals too    Why is this important?   These steps will help you keep on track with your medicines.   Notes:        Patient Care Plan: General Pharmacy (Adult)     Problem Identified: HTN, Breast cancer, HLD   Priority: High  Onset Date: 02/13/2021     Goal: Patient-Specific Goal   Note:   Current Barriers:  None identified at this visit  Pharmacist Clinical Goal(s):  Patient will maintain control of BP as evidenced by home monitoring  through collaboration with PharmD and provider.   Interventions: 1:1 collaboration with Susy Frizzle, MD regarding development and update of comprehensive plan of care as evidenced by provider attestation and co-signature Inter-disciplinary care team collaboration (see longitudinal plan of care) Comprehensive medication review performed; medication list updated in electronic medical record  Hypertension (BP goal <140/90) 05/13/22 -Controlled -Current treatment: Triamterene/HCTZ 37.5-'25mg'$  daily Appropriate, Effective, Safe, Accessible Losartan '100mg'$  daily Appropriate, Effective, Safe, Accessible -Medications previously tried: amlodipine  -Current home readings: not checking -Current exercise habits: walking a few times weekly -Denies hypotensive/hypertensive symptoms -Educated on BP goals and benefits of medications for prevention of heart attack, stroke and kidney damage; Daily salt intake goal < 2300 mg; Exercise goal of 150 minutes per week; Importance of home blood pressure monitoring; BP  controlled, last in office visit showed well controlled. No issues with meds, adherence 100%  Update 08/20/21 No changes to BP meds since last visit.  Denies any dizziness or HA at home.  Most recent office BP was slightly elevated, this was however at her cancer doc so possible some stress related to that visit.  No symptoms of hypertension at home. Continue current meds Has lots of family coming in for Christmas and plans to enjoy that!   Hyperlipidemia: (LDL goal < 100) -Controlled, last known lipid panel in 2016 -Current treatment: None -Medications previously tried: none noted  -Current exercise habits: occasional walking -Educated on Cholesterol goals;  Recommended lipid panel at next CPE appointment in October. -Recommended continue current meds and make appt for physical in October.  Breast Cancer  -Controlled, not assessed -Current treatment  Tamoxifen '20mg'$  daily -Medications previously tried: none noted -Sees oncologist regularly  -Recommended to continue current medication  Asthma? (Goal: minimize symptoms) 05/13/22 -Controlled -Current treatment  Montelukast '10mg'$  daily Appropriate, Effective, Safe, Accessible Symbicort 80-4.46mg 2 puffs bid Appropriate, Effective, Safe, Accessible Albuterol HFA 90 mcg prn Appropriate, Effective, Safe, Accessible Albuterol 0.083% nebs prn Appropriate, Effective, Safe, Accessible -Medications previously tried: none noted -Reports consistent use of maintenance inhaler, Symbicort -Uses albuterol infrequently -Denies any SOB or wheezing recently  -Recommended to continue current medication -Copays are still affordable,  no changes at this time.  She does request refills on Symbicort and Albuterol.  Will coordinate with PCP team.  Update 08/20/21 Continues to rarely need her rescue inhaler. Does report that she uses her Symbicort daily for maintenance. No SOB recently. Copay on Symbicort is high, however she still is able to afford  it and does not have to go without meds. No changes needed - continue current meds.  Patient Goals/Self-Care Activities Patient will:  - take medications as prescribed focus on medication adherence by pill count check blood pressure when sypmtomatic, document, and provide at future appointments  Follow Up Plan: The care management team will reach out to the patient again over the next 365 days.             The patient verbalized understanding of instructions, educational materials, and care plan provided today and DECLINED offer to receive copy of patient instructions, educational materials, and care plan.  Telephone follow up appointment with pharmacy team member scheduled for: no FU needed at this time  Edythe Clarity, [place], PharmD, [place] Clinical Pharmacist Practitioner [place] [phone]

## 2022-05-14 ENCOUNTER — Ambulatory Visit
Admission: RE | Admit: 2022-05-14 | Discharge: 2022-05-14 | Disposition: A | Discharge status: Home / Self Care | Payer: Medicare HMO | Source: Ambulatory Visit | Attending: Family Medicine | Admitting: Family Medicine

## 2022-05-14 DIAGNOSIS — M48061 Spinal stenosis, lumbar region without neurogenic claudication: Secondary | ICD-10-CM | POA: Diagnosis not present

## 2022-05-14 DIAGNOSIS — M5432 Sciatica, left side: Secondary | ICD-10-CM

## 2022-05-18 LAB — COMPLETE METABOLIC PANEL WITH GFR
AG Ratio: 1.2 (calc) (ref 1.0–2.5)
ALT: 18 U/L (ref 6–29)
AST: 20 U/L (ref 10–35)
Albumin: 4.1 g/dL (ref 3.6–5.1)
Alkaline phosphatase (APISO): 65 U/L (ref 37–153)
BUN: 17 mg/dL (ref 7–25)
CO2: 28 mmol/L (ref 20–32)
Calcium: 9.4 mg/dL (ref 8.6–10.4)
Chloride: 98 mmol/L (ref 98–110)
Creat: 1.02 mg/dL (ref 0.50–1.05)
Globulin: 3.4 g/dL (calc) (ref 1.9–3.7)
Glucose, Bld: 123 mg/dL — ABNORMAL HIGH (ref 65–99)
Potassium: 3.6 mmol/L (ref 3.5–5.3)
Sodium: 136 mmol/L (ref 135–146)
Total Bilirubin: 0.3 mg/dL (ref 0.2–1.2)
Total Protein: 7.5 g/dL (ref 6.1–8.1)
eGFR: 60 mL/min/{1.73_m2} (ref >=60)

## 2022-05-18 LAB — TEST AUTHORIZATION 2

## 2022-05-18 LAB — MAGNESIUM: Magnesium: 1.5 mg/dL (ref 1.5–2.5)

## 2022-05-18 LAB — LIPID PANEL
Cholesterol: 159 mg/dL (ref <200)
HDL: 55 mg/dL (ref >=50)
LDL Cholesterol (Calc): 83 mg/dL (calc)
Non-HDL Cholesterol (Calc): 104 mg/dL (calc) (ref <130)
Total CHOL/HDL Ratio: 2.9 (calc) (ref <5.0)
Triglycerides: 113 mg/dL (ref <150)

## 2022-05-18 LAB — EXTRA LAV TOP TUBE

## 2022-05-18 LAB — HEMOGLOBIN A1C W/OUT EAG: Hgb A1c MFr Bld: 5.8 % of total Hgb — ABNORMAL HIGH (ref <5.7)

## 2022-05-24 ENCOUNTER — Encounter: Payer: Self-pay | Admitting: Family Medicine

## 2022-05-24 ENCOUNTER — Telehealth: Payer: Self-pay

## 2022-05-24 NOTE — Telephone Encounter (Signed)
Pt called in stating  that she was recently seen by pcp for some back pain. Pt states that she is still having some really bad back pain. Pt would like to know if she could get something for pain sent to pharmacy, and also to get a referral to an orthopedic please. Pt would like a cb if this is possible.   Cb#: [phone]

## 2022-05-25 ENCOUNTER — Other Ambulatory Visit: Payer: Self-pay | Admitting: Family Medicine

## 2022-05-25 DIAGNOSIS — M5432 Sciatica, left side: Secondary | ICD-10-CM

## 2022-05-25 MED ORDER — GABAPENTIN 300 MG PO CAPS: 300.0000 mg | ORAL_CAPSULE | Freq: Three times a day (TID) | ORAL | 3 refills | Status: DC | PRN

## 2022-05-25 MED ORDER — HYDROCODONE-ACETAMINOPHEN 5-325 MG PO TABS: 1.0000 | ORAL_TABLET | Freq: Four times a day (QID) | ORAL | 0 refills | Status: DC | PRN

## 2022-05-28 ENCOUNTER — Other Ambulatory Visit: Payer: Self-pay

## 2022-05-28 MED ORDER — ALBUTEROL SULFATE HFA 108 (90 BASE) MCG/ACT IN AERS: INHALATION_SPRAY | RESPIRATORY_TRACT | 5 refills | Status: DC

## 2022-05-28 NOTE — Telephone Encounter (Signed)
Pharmacy faxed a refill request for albuterol (VENTOLIN HFA) 108 (90 Base) MCG/ACT inhaler [[id number]]    Order Details Dose, Route, Frequency: As Directed  Dispense Quantity: 18 each Refills: 5        Sig: INHALE 1-2 PUFFS EVERY 6 HOURS AS NEEDED FOR WHEEZING.       Start Date: 09/16/21 End Date: --  Written Date: 09/16/21 Expiration Date: 09/16/22  Original Order:  albuterol (VENTOLIN HFA) 108 (90 Base) MCG/ACT inhaler [[id number]]  Providers

## 2022-05-28 NOTE — Telephone Encounter (Signed)
Requested Prescriptions  Pending Prescriptions Disp Refills  . albuterol (VENTOLIN HFA) 108 (90 Base) MCG/ACT inhaler 18 each 5    Sig: INHALE 1-2 PUFFS EVERY 6 HOURS AS NEEDED FOR WHEEZING.     Pulmonology:  Beta Agonists 2 Failed - 05/28/2022  1:16 PM      Failed - Valid encounter within last 12 months    Recent Outpatient Visits          1 year ago Hypomagnesemia   [place], Cammie Mcgee, MD   1 year ago History of breast cancer in female   [place], Cammie Mcgee, MD   1 year ago History of breast cancer in female   [place], Cammie Mcgee, MD   2 years ago Exacerbation of asthma, unspecified asthma severity, unspecified whether persistent   [place], [place], FNP   2 years ago Exacerbation of asthma, unspecified asthma severity, unspecified whether persistent   [place], Crystal A, FNP             Passed - Last BP in normal range    BP Readings from Last 1 Encounters:  05/11/22 118/64         Passed - Last Heart Rate in normal range    Pulse Readings from Last 1 Encounters:  05/11/22 96

## 2022-05-31 ENCOUNTER — Encounter (HOSPITAL_BASED_OUTPATIENT_CLINIC_OR_DEPARTMENT_OTHER): Payer: Self-pay | Admitting: Physical Therapy

## 2022-05-31 ENCOUNTER — Ambulatory Visit (HOSPITAL_BASED_OUTPATIENT_CLINIC_OR_DEPARTMENT_OTHER): Payer: Medicare HMO | Attending: Family Medicine | Admitting: Physical Therapy

## 2022-05-31 DIAGNOSIS — Z853 Personal history of malignant neoplasm of breast: Secondary | ICD-10-CM | POA: Diagnosis not present

## 2022-05-31 DIAGNOSIS — R29898 Other symptoms and signs involving the musculoskeletal system: Secondary | ICD-10-CM | POA: Insufficient documentation

## 2022-05-31 DIAGNOSIS — M5432 Sciatica, left side: Secondary | ICD-10-CM | POA: Insufficient documentation

## 2022-05-31 DIAGNOSIS — M6281 Muscle weakness (generalized): Secondary | ICD-10-CM | POA: Insufficient documentation

## 2022-05-31 NOTE — Therapy (Addendum)
OUTPATIENT PHYSICAL THERAPY THORACOLUMBAR EVALUATION   Patient Name: Krystal Brooks MRN: [id number] DOB:Apr 12, 1956, 66 y.o., female Today's Date: 05/31/2022   PT End of Session - 05/31/22 1339     Visit Number 1    Number of Visits 2    Date for PT Re-Evaluation 06/28/22    Authorization Type [employer]/MCR    Authorization Time Period 05/31/22 to 06/28/22    Progress Note Due on Visit 10    PT Start Time 1300    PT Stop Time 1342    PT Time Calculation (min) 42 min    Activity Tolerance Patient tolerated treatment well    Behavior During Therapy [facility] for tasks assessed/performed             Past Medical History:  Diagnosis Date   Adie's pupil    LEFT EYE   Allergy    Asthma    Atherosclerosis of aorta (HCC)    bulge above renal arteries on mri 2016   Cancer ([place]) 2017   right breast ca   Cellulitis    chest   Complication of anesthesia    VERY HARD TO WAKE UP     Eczema    Hypertension    Personal history of radiation therapy    PONV (postoperative nausea and vomiting)    Smoker    Past Surgical History:  Procedure Laterality Date   ABDOMINAL HYSTERECTOMY     BREAST BIOPSY Right 01/02/2016   BREAST EXCISIONAL BIOPSY Right 1979   BREAST IMPLANT REMOVAL Bilateral 10/05/2017   Procedure: REMOVALOF BILATERAL BREAST IMPLANTS;  Surgeon: Wallace Going, DO;  Location: [place];  Service: Plastics;  Laterality: Bilateral;   BREAST LUMPECTOMY Right 01/27/2016   BREAST LUMPECTOMY WITH RADIOACTIVE SEED AND SENTINEL LYMPH NODE BIOPSY Right 01/27/2016   Procedure: BREAST LUMPECTOMY WITH RADIOACTIVE SEED AND SENTINEL LYMPH NODE BIOPSY;  Surgeon: Excell Seltzer, MD;  Location: [place];  Service: General;  Laterality: Right;   BREAST RECONSTRUCTION WITH PLACEMENT OF TISSUE EXPANDER AND FLEX HD (ACELLULAR HYDRATED DERMIS) Bilateral 03/16/2017   Procedure: BILATERAL IMMEDIATE BREAST RECONSTRUCTION WITH PLACEMENT OF TISSUE EXPANDER AND FLEX HD  (ACELLULAR HYDRATED DERMIS);  Surgeon: Wallace Going, DO;  Location: [place];  Service: Plastics;  Laterality: Bilateral;   BREAST SURGERY  1979   cyst removed rt breast   CATARACT EXTRACTION W/ INTRAOCULAR LENS IMPLANT     RT EYE   CESAREAN SECTION     x2   EXCISION OF BREAST LESION Left 01/30/2018   Procedure: EXCISION OF LEFT BREAST SCAR;  Surgeon: Wallace Going, DO;  Location: [place];  Service: Plastics;  Laterality: Left;   LATISSIMUS FLAP TO BREAST Right 01/30/2018   Procedure: RIGHT LATISSIMUS MYOCUTANEOUS FLAP FOR RIGHT CHEST WALL RECONSTRUCTION;  Surgeon: Wallace Going, DO;  Location: [place];  Service: Plastics;  Laterality: Right;   MASTECTOMY W/ SENTINEL NODE BIOPSY Bilateral 03/16/2017   Procedure: BILATERAL TOTAL MASTECTOMY WITH LEFT SENTINEL LYMPH NODE BIOPSY;  Surgeon: Excell Seltzer, MD;  Location: [place];  Service: General;  Laterality: Bilateral;   REMOVAL OF BILATERAL TISSUE EXPANDERS WITH PLACEMENT OF BILATERAL BREAST IMPLANTS Bilateral 06/15/2017   Procedure: REMOVAL OF BILATERAL TISSUE EXPANDERS WITH PLACEMENT OF BILATERAL BREAST IMPLANTS;  Surgeon: Wallace Going, DO;  Location: [place];  Service: Plastics;  Laterality: Bilateral;   TOTAL MASTECTOMY Bilateral 03/16/2017   SENTINAL NODE BIOPSY   Patient Active Problem List   Diagnosis Date Noted  Acquired absence of right breast 01/30/2018   Status post bilateral breast reconstruction 06/24/2017   Acquired absence of bilateral breasts and nipples 03/22/2017   Breast cancer ([place]) 03/16/2017   History of breast cancer in female 01/27/2017   Malignant neoplasm of upper-inner quadrant of left breast in female, estrogen receptor positive ([place]) 01/03/2017   HTN (hypertension) 06/14/2016   Malignant neoplasm of lower-inner quadrant of right breast of female, estrogen receptor positive ([place]) 01/07/2016   Atherosclerosis of aorta (HCC)    Smoker     PCP: Susy Frizzle, MD    REFERRING PROVIDER: Susy Frizzle, MD   REFERRING DIAG: [phone] (ICD-10-CM) - Left sided sciatica   Rationale for Evaluation and Treatment Rehabilitation  THERAPY DIAG:  Sciatica, left side - Plan: PT plan of care cert/re-cert  Muscle weakness (generalized) - Plan: PT plan of care cert/re-cert  Other symptoms and signs involving the musculoskeletal system - Plan: PT plan of care cert/re-cert  ONSET DATE: [id number]   SUBJECTIVE:                                                                                                                                                                                           SUBJECTIVE STATEMENT:  My pain will flare up and then it won't bother me for a long time, it bothered me about 6 years ago when I was keeping my newborn grandchild and had to pick her and a lot of her equipment up off the floor. I took chiro back then and it helped and didn't bother me until recently. When its good its good when its bad its really bad I can't leave the bed. There was a weekend a couple weeks ago where I had to stay in the bed. Its not constant pain but when it hits me its very constant. My PCP sent me for an xray which showed degenerative discs.  I know I don't have the best posture, had 2 c-sections and lat flap surgery after dealing with breast CA so I know I don't have the best trunk strength. Seems like pain tends to hit me on the weekends after I deep clean the house Thursdays, I haven't done this since a couple weeks ago and I've not had pain since.   PERTINENT HISTORY:  Left sided sciatica - Plan: DG Lumbar Spine Complete Given the patient's history of breast cancer I would like to obtain an x-ray of the lumbar spine.  I suspect degenerative disc disease.  We will give the patient prednisone to try to expedite the healing process.  As long as the x-ray is benign, I would allow  another 2 to 3 weeks to improve.  While the patient is here today I would like  to repeat her magnesium and potassium levels get fasting lipid panel.  The patient had seizures last year due to hypokalemia, hypomagnesemia, and COVID.  Therefore just follow-up to see what her electrolytes levels are.  PAIN:  Are you having pain? No can get to 10/10 at worst   PRECAUTIONS: Other: hx breast CA   WEIGHT BEARING RESTRICTIONS No  FALLS:  Has patient fallen in last 6 months? No  LIVING ENVIRONMENT: Lives with: lives with their spouse Lives in: House/apartment Stairs: 4 STE U rail, one story of steps with rail inside  Has following equipment at home: None  OCCUPATION: retired   PLOF: Independent, Independent with basic ADLs, Independent with gait, and Independent with transfers  PATIENT GOALS manage pain and keep it at [place] when it does want to come back   OBJECTIVE:   DIAGNOSTIC FINDINGS:  IMPRESSION: 1. No evidence for fracture or malalignment. 2. Mild levoconvex curvature of the lumbar spine. 3. Mild-to-moderate degenerative changes.    PATIENT SURVEYS:  FOTO 99  SCREENING FOR RED FLAGS: Bowel or bladder incontinence: No Spinal tumors: No Cauda equina syndrome: No Compression fracture: No Abdominal aneurysm: No  COGNITION:  Overall cognitive status: Within functional limits for tasks assessed     SENSATION: None but when pain is at its worst has sciatica all the way down into her foot   MUSCLE LENGTH:  Quads moderate limitation B L>R, HS mild limitation B, piriformis mild limitation B  POSTURE: rounded shoulders, forward head, decreased lumbar lordosis, increased thoracic kyphosis, and flexed trunk   PALPATION:  Not TTP in lumbar spine, glutes/piriformis, HS   LUMBAR ROM:   Active  A/PROM  eval  Flexion WNL; RFIS no increase in pain   Extension WNL, REIS no change   Right lateral flexion WNL   Left lateral flexion WNL   Right rotation   Left rotation    (Blank rows = not tested)  LOWER EXTREMITY ROM:     Active  Right eval  Left eval  Hip flexion    Hip extension    Hip abduction    Hip adduction    Hip internal rotation    Hip external rotation    Knee flexion    Knee extension    Ankle dorsiflexion    Ankle plantarflexion    Ankle inversion    Ankle eversion     (Blank rows = not tested)  LOWER EXTREMITY MMT:    MMT Right eval Left eval  Hip flexion 3+ 3+  Hip extension 3 3  Hip abduction 3- 3  Hip adduction    Hip internal rotation    Hip external rotation    Knee flexion 4+ 4  Knee extension 4+ 4+  Ankle dorsiflexion 4+ 4+  Ankle plantarflexion    Ankle inversion    Ankle eversion     (Blank rows = not tested)  LUMBAR SPECIAL TESTS:   Sciatic flossing no change in symptoms  No leg length difference   FUNCTIONAL TESTS:    GAIT:    TODAY'S TREATMENT   Eval   Nustep L4 BLEs only x6 minutes  Bridges + ABD with red TB x10 Sidelying hip ABD x10 B red TB  Prone hip extensions x10 B red TB PPT x15 with 3 second holds HS stretch 2x30 seconds L LE  Piriformis stretch 2x30 seconds L LE    PATIENT  EDUCATION:  Education details: exam findings, POC, HEP  Person educated: Patient Education method: Explanation, Demonstration, and Handouts Education comprehension: verbalized understanding and returned demonstration   HOME EXERCISE PROGRAM:  [id number]  ASSESSMENT:  CLINICAL IMPRESSION: Patient is a 66 y.o. F who was seen today for physical therapy evaluation and treatment for sciatica. Of note, symptoms are not consistent/not always present, but when she does have them they are consistently present for up to 3-4 days until they finally go away. Exam reveals significant functional mm weakness around hips and core, also mild flexibility impairment but I was unfortunately unable to provoke any pain or symptoms today. Assigned an HEP based on impairments found- will have her come back in 3-4 weeks for a check in, otherwise she will plan to call us to be seen sooner if she starts to  have any increase in pain in the meantime.    OBJECTIVE IMPAIRMENTS decreased strength, hypomobility, increased fascial restrictions, impaired flexibility, postural dysfunction, and pain.   ACTIVITY LIMITATIONS carrying, lifting, bending, standing, squatting, transfers, bed mobility, and locomotion level  PARTICIPATION LIMITATIONS: meal prep, cleaning, laundry, driving, shopping, and community activity  PERSONAL FACTORS Age, Behavior pattern, Past/current experiences, and Time since onset of injury/illness/exacerbation are also affecting patient's functional outcome.   REHAB POTENTIAL: Fair symptoms not present at eval/difficult to come to a specific dx/POC   CLINICAL DECISION MAKING: Evolving/moderate complexity  EVALUATION COMPLEXITY: Moderate   GOALS: Goals reviewed with patient? Yes  SHORT TERM GOALS: Target date: 06/14/2022  Will be compliant with appropriate progressive HEP  Baseline: Goal status: INITIAL    LONG TERM GOALS: Target date: 06/28/2022  MMT to improve by at least 1 grade in all weak groups  Baseline:  Goal status: INITIAL  2.  Mm flexibility impairments to have resolved  Baseline:  Goal status: INITIAL  3.  Pain will be no more than 50% of prior intensity of prior episodes Baseline:  Goal status: INITIAL  4.  Further long term goals to be established at next reassessment if appropriate  Baseline:  Goal status: INITIAL    PLAN: PT FREQUENCY:  one additional visit   PT DURATION: other: within next 3-4 weeks   PLANNED INTERVENTIONS: Therapeutic exercises, Therapeutic activity, Neuromuscular re-education, Balance training, Gait training, Patient/Family education, Self Care, Joint mobilization, Aquatic Therapy, Dry Needling, Electrical stimulation, Spinal mobilization, Cryotherapy, Moist heat, Taping, Ultrasound, Ionotophoresis 66m/ml Dexamethasone, Manual therapy, and Re-evaluation.  PLAN FOR NEXT SESSION: how is she feeling? Has pain come back?  How is HEP? Possible DC if no further instances    Arath Kaigler U PT DPT PN2  05/31/2022, 1:43 PM  PHYSICAL THERAPY DISCHARGE SUMMARY  Visits from Start of Care: 1  Current functional level related to goals / functional outcomes: See above   Remaining deficits: See above   Education / Equipment: Anatomy of condition, POC, HEP, exercise form/rationale    Patient agrees to discharge. Patient goals were not met. Patient is being discharged due to a change in medical status. Jessica C. Hightower PT, DPT 10/21/22 9:26 AM

## 2022-06-11 IMAGING — DX DG CHEST 2V
2 series · 2 of 2 positions shown · non-contrast
Comparison: June 13, 2015.

CLINICAL DATA: Cough.

EXAM:
CHEST - 2 VIEW

[chest pa]
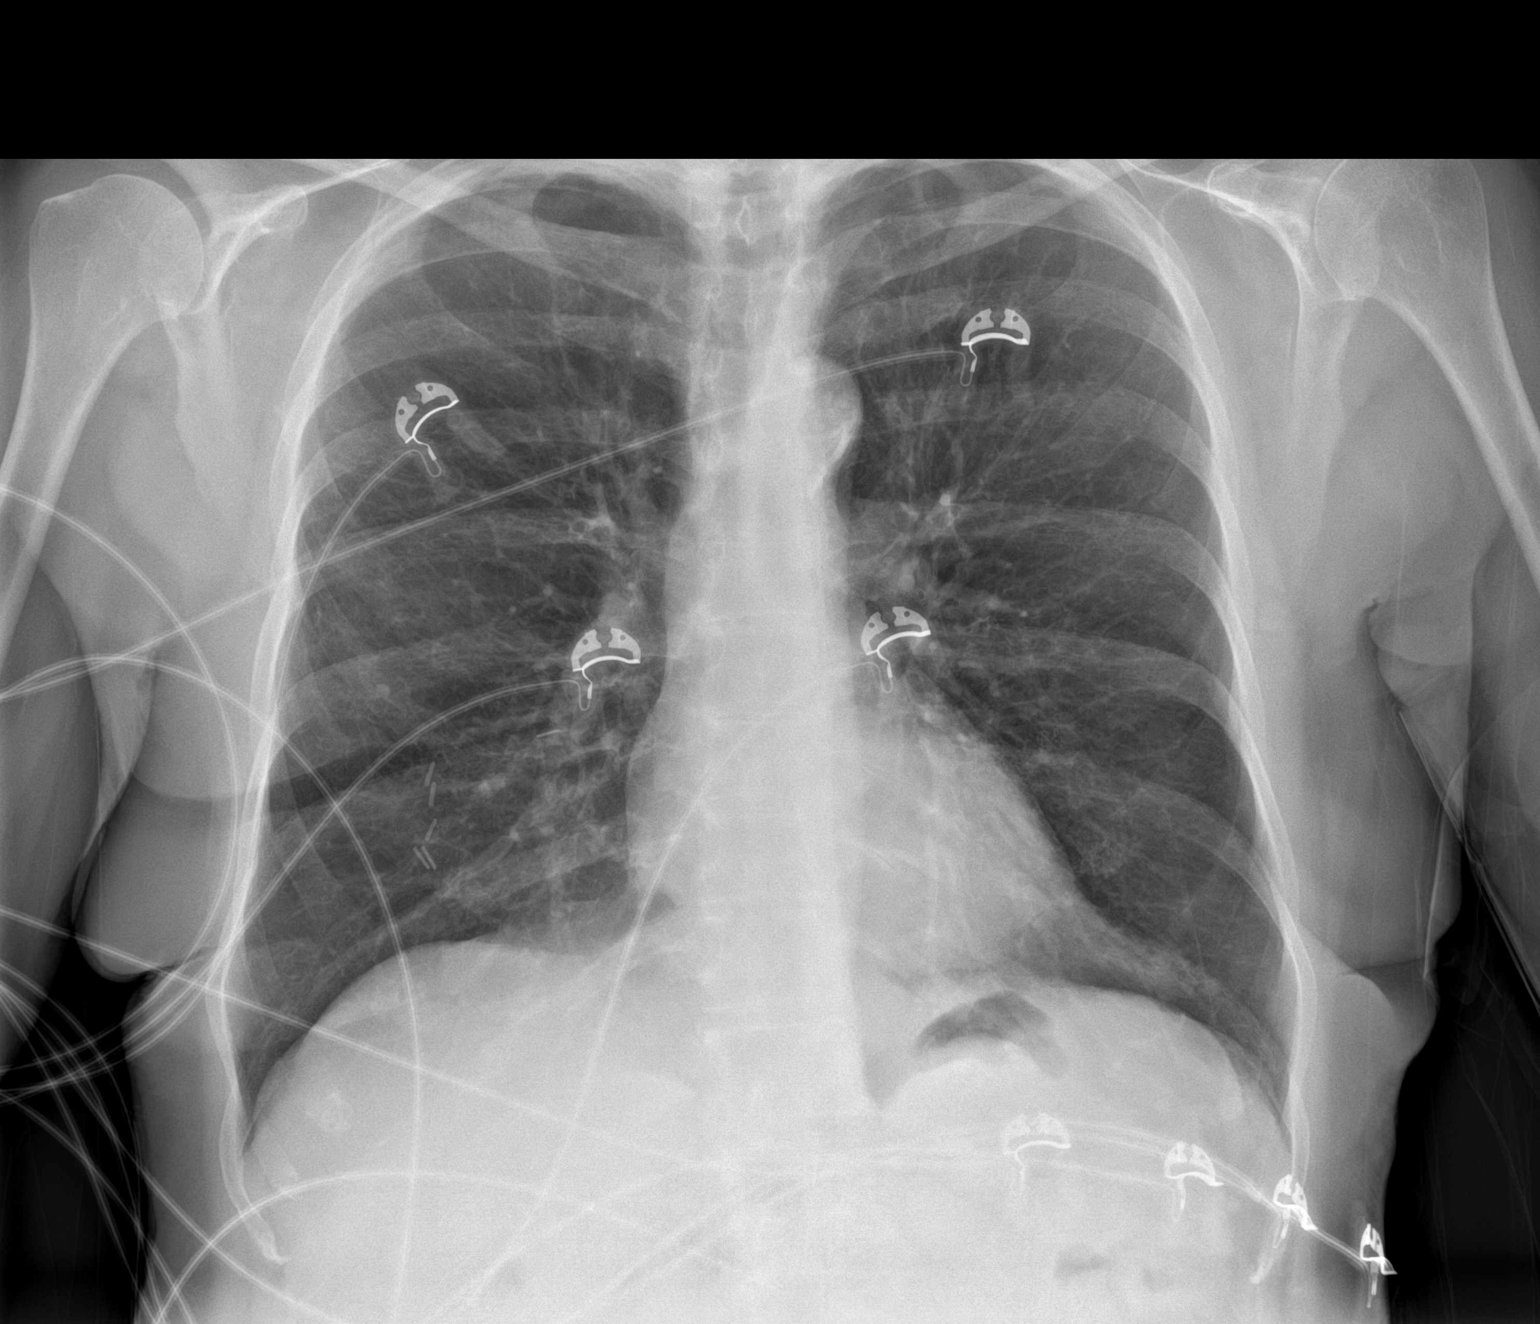

[chest lat]
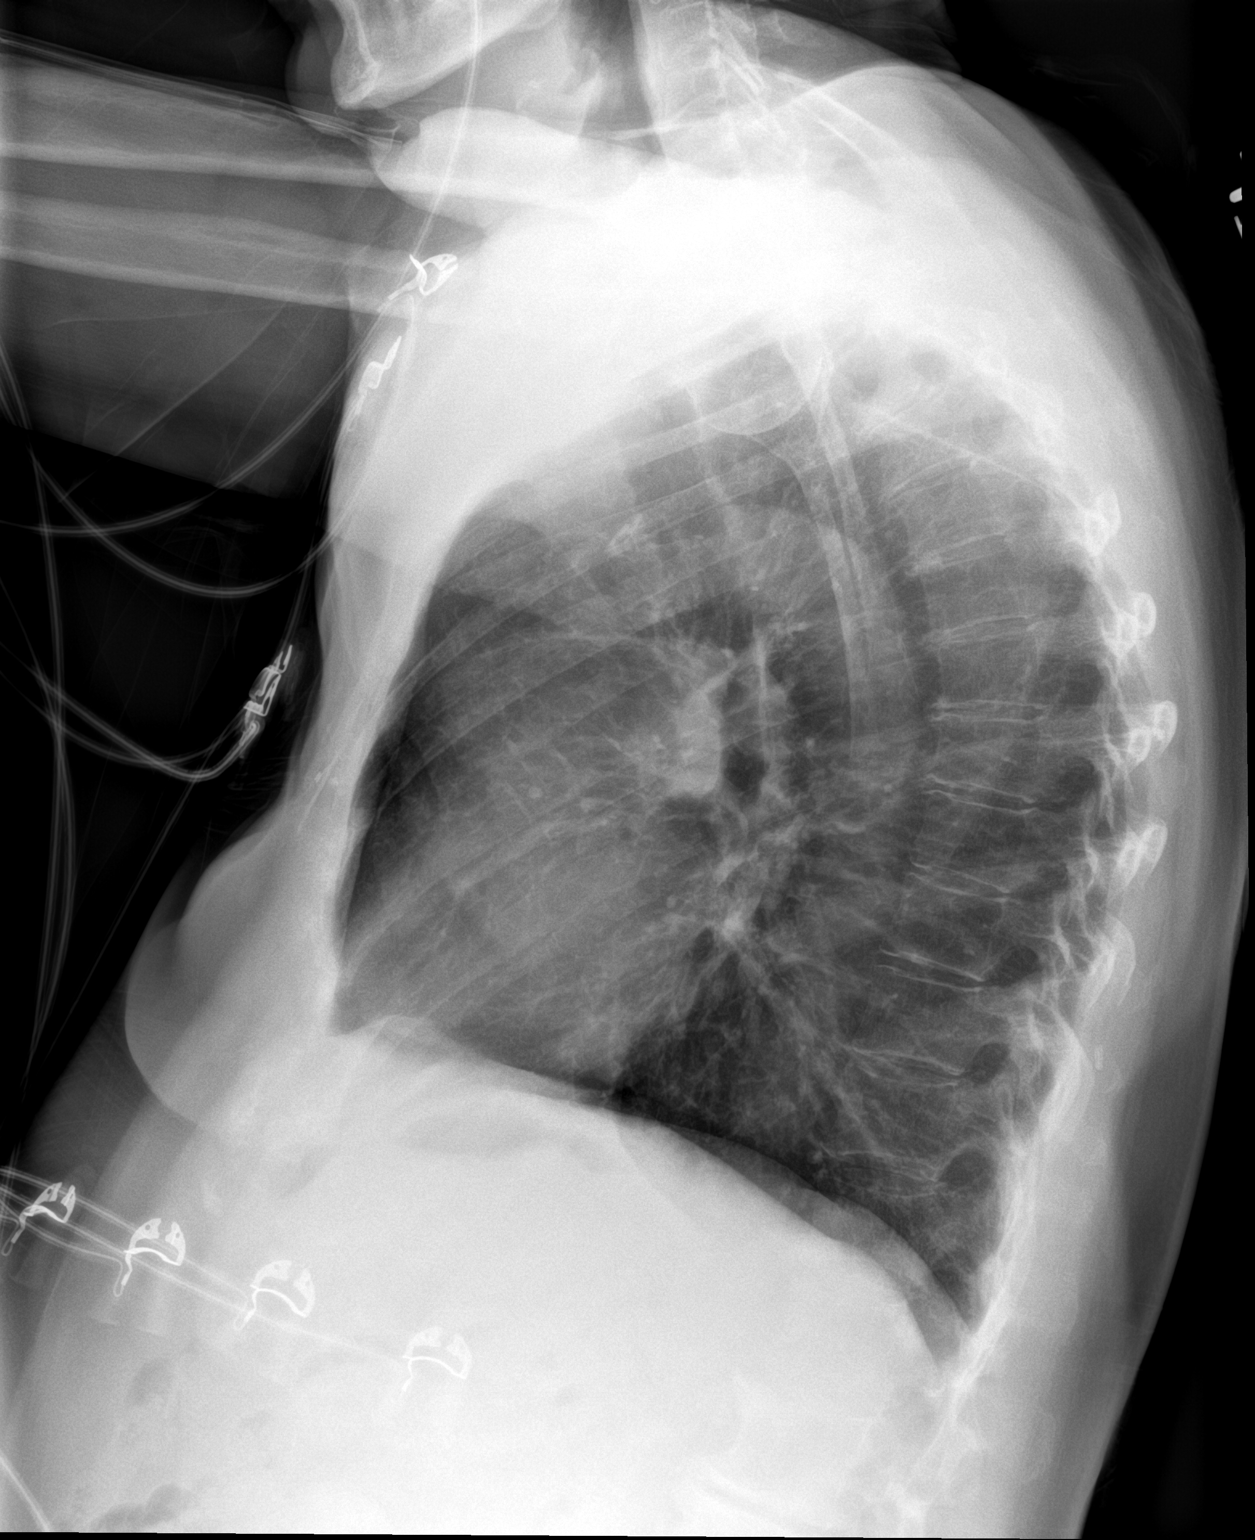

[2 of 2 positions shown; findings below may reference images not displayed]

FINDINGS: The heart size and mediastinal contours are within normal limits.
Both lungs are clear. The visualized skeletal structures are
unremarkable.
IMPRESSION: No active cardiopulmonary disease.

Aortic Atherosclerosis ([id number]).

## 2022-06-11 IMAGING — CT CT HEAD W/O CM
4 series · 17 of 47 positions shown, 19 images · non-contrast
Comparison: None.

CLINICAL DATA: Seizure.

EXAM:
CT HEAD WITHOUT CONTRAST
TECHNIQUE: Contiguous axial images were obtained from the base of the skull
through the vertex without intravenous contrast.

[Series 2: head wo · axial · 0.41mm/px · z∈[-38,+72]mm · 7 of 30 slices shown, 9 images]
[im 4/30  brain]
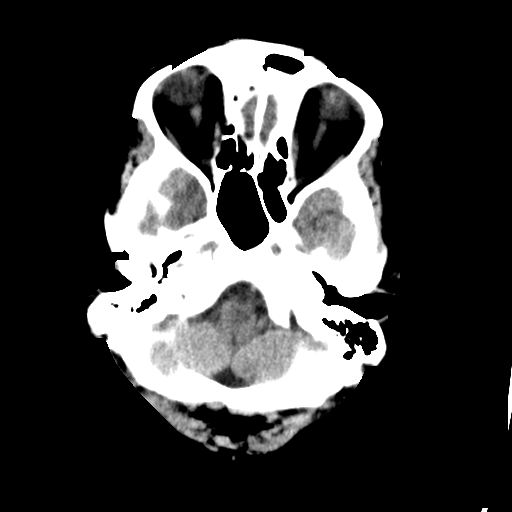
[im 4/30  bone]
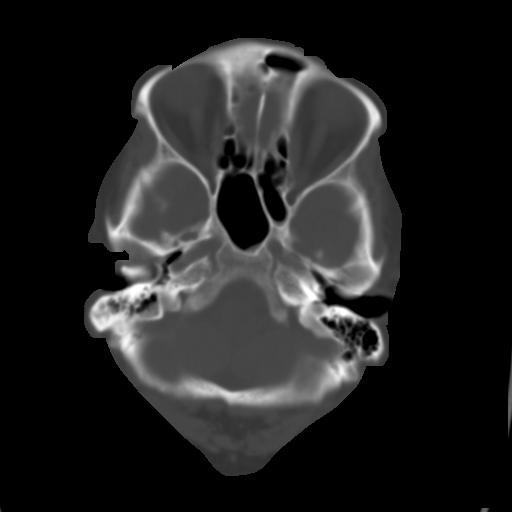
[im 8/30  brain]
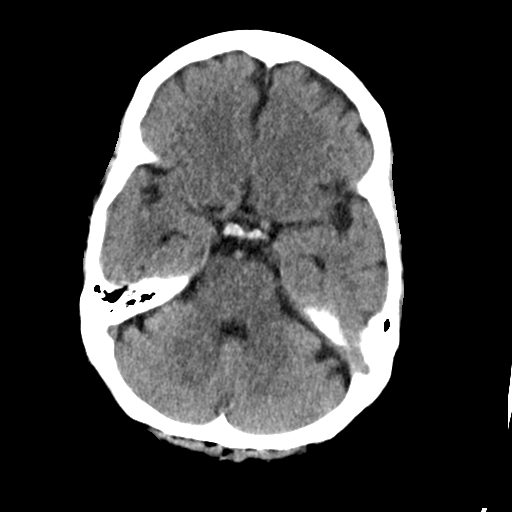
[im 11/30  brain]
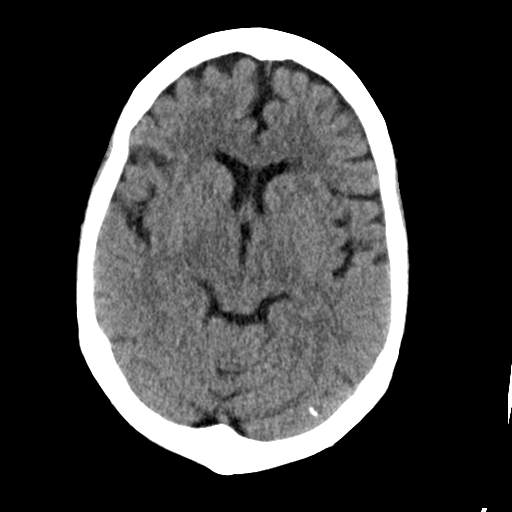
[im 15/30  brain]
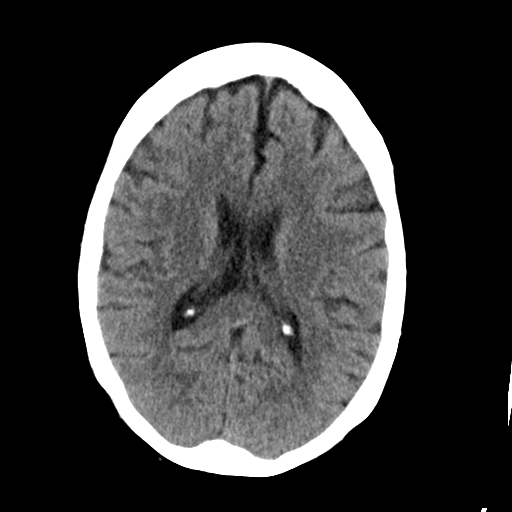
[im 19/30  brain]
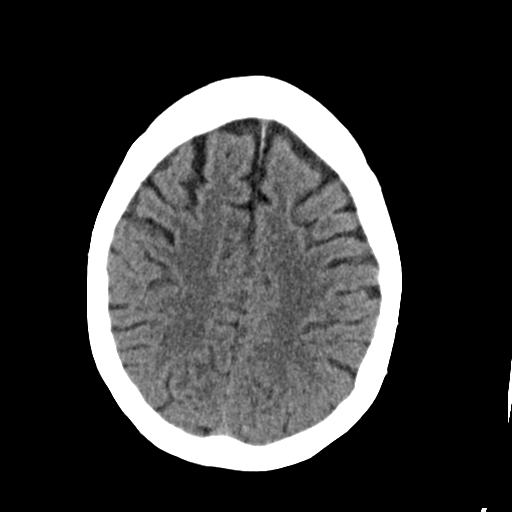
[im 19/30  bone]
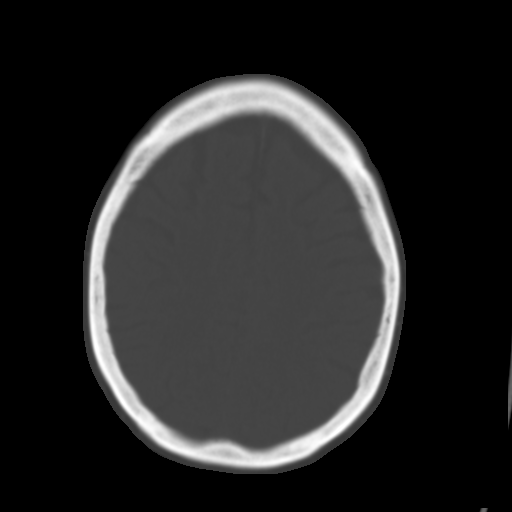
[im 22/30  brain]
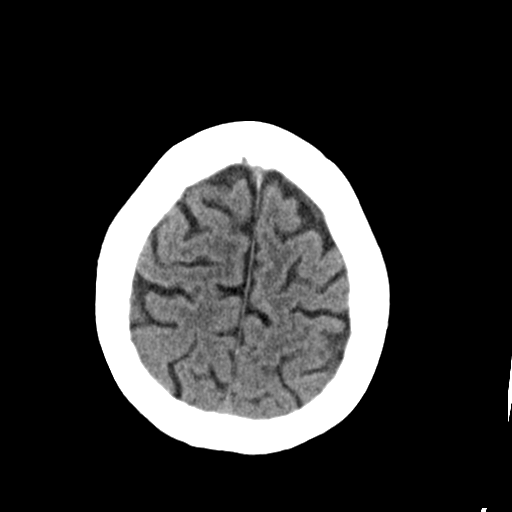
[im 26/30  brain]
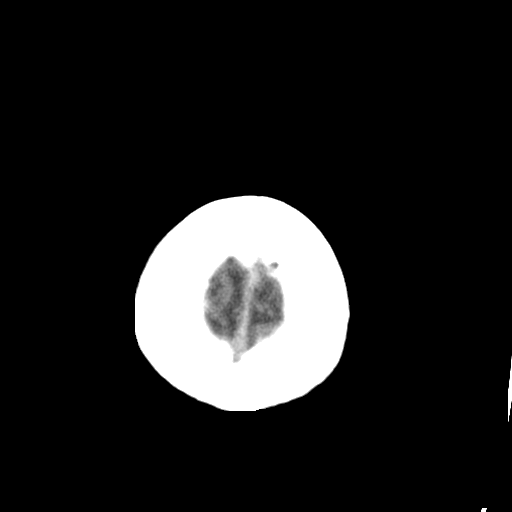

[Series 3: head bone · axial · 0.41mm/px · z∈[-39,+13]mm · 4 of 75 slices shown]
[im 8/75  bone]
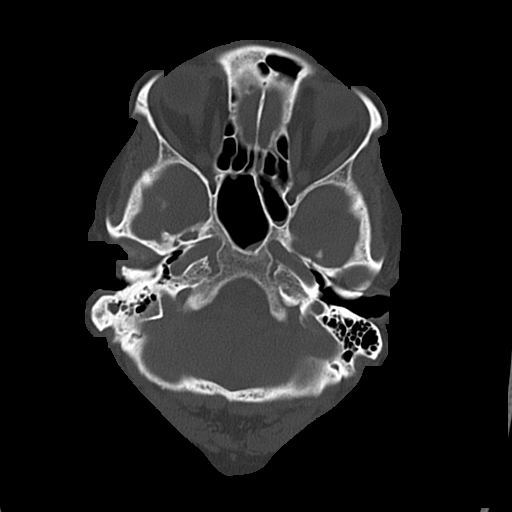
[im 15/75  bone]
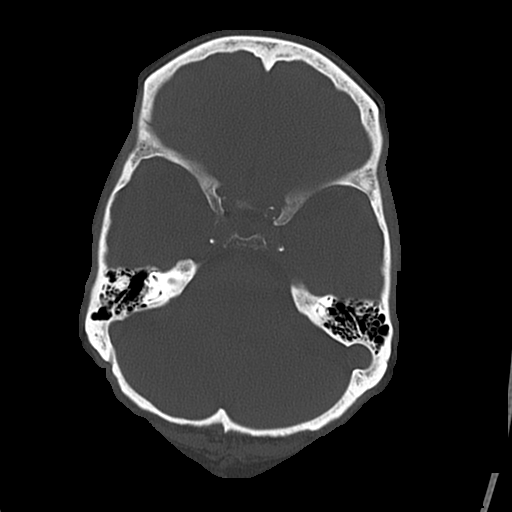
[im 23/75  bone]
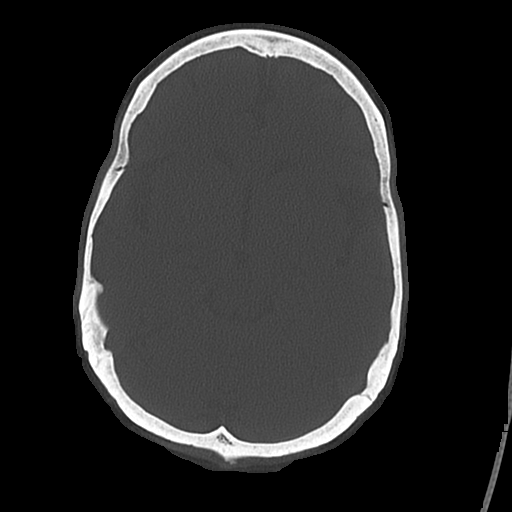
[im 34/75  bone]
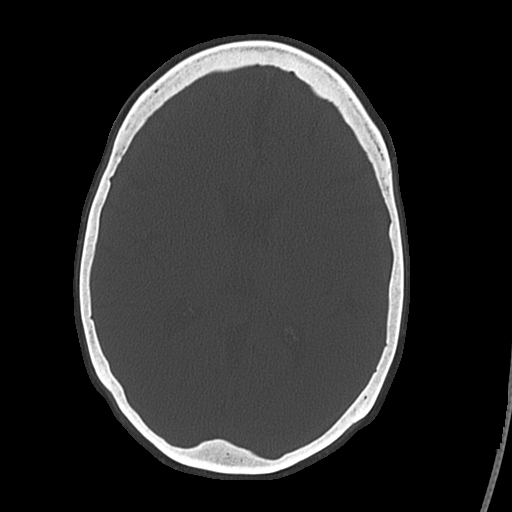

[Series 4: coronal soft · coronal · 0.29mm/px · 3 of 63 slices shown]
[im 21/63  brain]
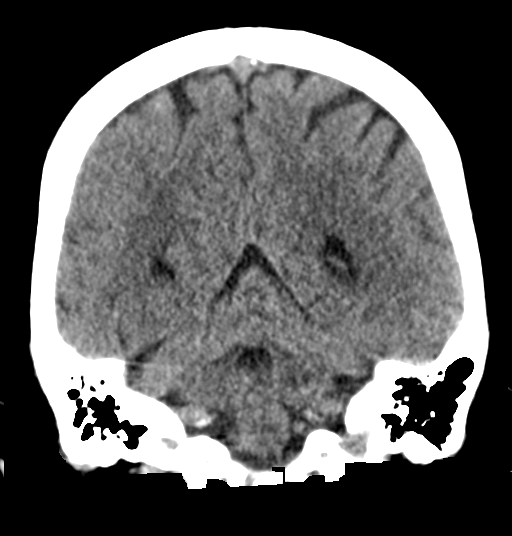
[im 28/63  brain]
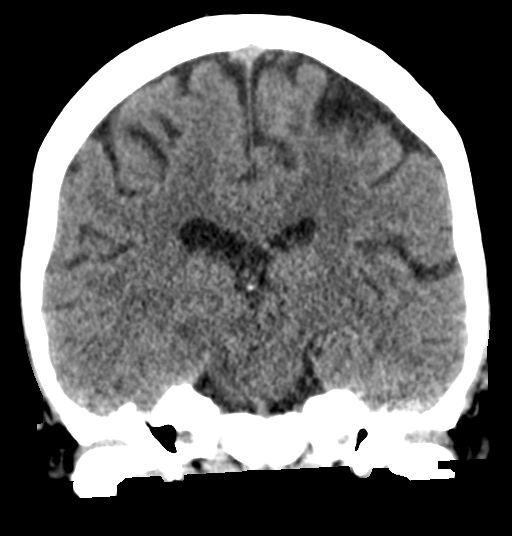
[im 35/63  brain]
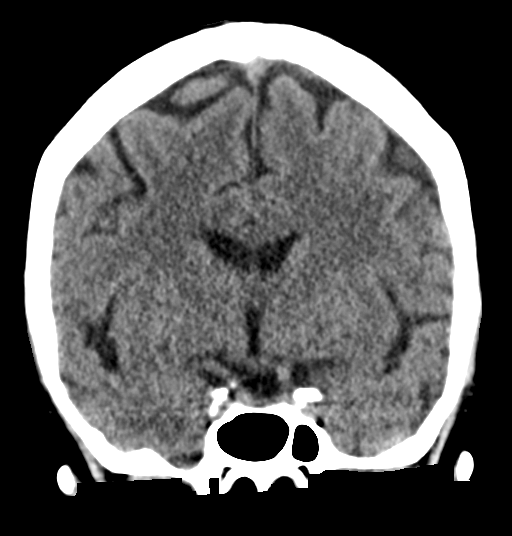

[Series 5: sagittal soft · sagittal · 0.30mm/px · 3 of 50 slices shown]
[im 17/50  brain]
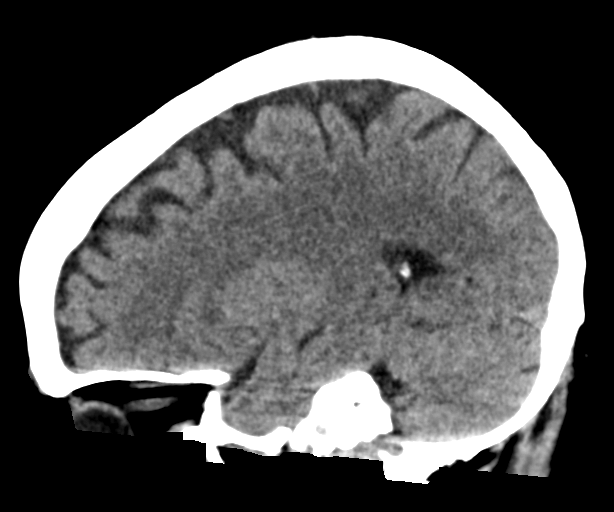
[im 25/50  brain]
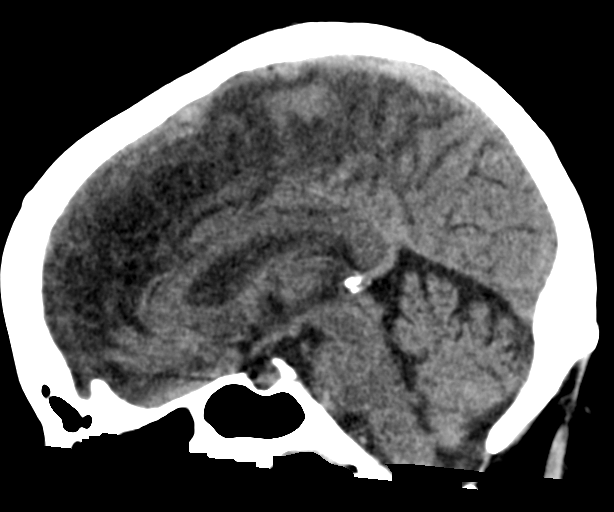
[im 33/50  brain]
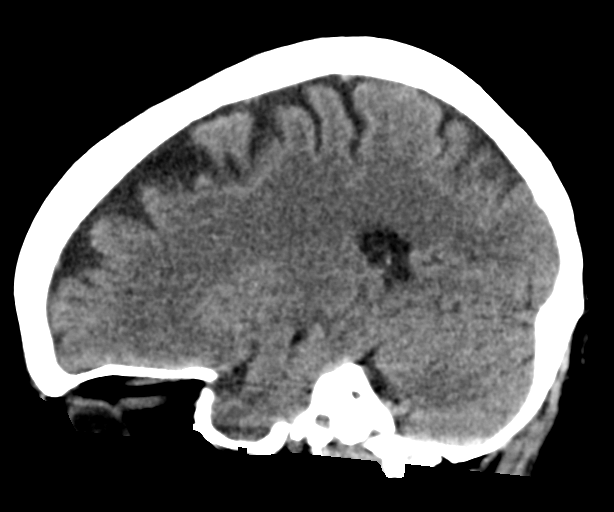

[17 of 47 positions shown; findings below may reference images not displayed]

FINDINGS: Brain: No evidence of acute infarction, hemorrhage, hydrocephalus,
extra-axial collection or mass lesion/mass effect.

Vascular: No hyperdense vessel or unexpected calcification.

Skull: Normal. Negative for fracture or focal lesion.

Sinuses/Orbits: Mastoid air cells are clear. Air-fluid level noted
in the left maxillary sinus. Partial opacification of the left
ethmoid air cells.

Other: None.
IMPRESSION: 1. No acute intracranial abnormalities.
2. Left maxillary sinus and ethmoid air cell opacification.

## 2022-06-22 DIAGNOSIS — M545 Low back pain, unspecified: Secondary | ICD-10-CM | POA: Diagnosis not present

## 2022-06-22 DIAGNOSIS — M5416 Radiculopathy, lumbar region: Secondary | ICD-10-CM | POA: Insufficient documentation

## 2022-06-25 ENCOUNTER — Ambulatory Visit (INDEPENDENT_AMBULATORY_CARE_PROVIDER_SITE_OTHER): Payer: Medicare HMO | Admitting: Family Medicine

## 2022-06-25 VITALS — BP 112/62 | HR 107 | Ht 61.0 in | Wt 119.4 lb

## 2022-06-25 DIAGNOSIS — J441 Chronic obstructive pulmonary disease with (acute) exacerbation: Secondary | ICD-10-CM | POA: Diagnosis not present

## 2022-06-25 MED ORDER — DOXYCYCLINE HYCLATE 100 MG PO TABS: 100.0000 mg | ORAL_TABLET | Freq: Two times a day (BID) | ORAL | 0 refills | Status: DC

## 2022-06-25 MED ORDER — PREDNISONE 20 MG PO TABS: 40.0000 mg | ORAL_TABLET | Freq: Every day | ORAL | 0 refills | Status: DC

## 2022-06-25 NOTE — Progress Notes (Signed)
Subjective:    Patient ID: Krystal Brooks, female    DOB: 1955-09-17, 66 y.o.   MRN: [id number]  Patient reports a cough productive of purulent sputum for 1 week.  The sputum is green and brown.  She reports wheezing.  She reports shortness of breath.  She reports central chest congestion.  She had a negative COVID test.  She is no longer smoking but has had a history of COPD exacerbations in the past Past Medical History:  Diagnosis Date   Adie's pupil    LEFT EYE   Allergy    Asthma    Atherosclerosis of aorta (HCC)    bulge above renal arteries on mri 2016   Cancer ([place]) 2017   right breast ca   Cellulitis    chest   Complication of anesthesia    VERY HARD TO WAKE UP     Eczema    Hypertension    Personal history of radiation therapy    PONV (postoperative nausea and vomiting)    Smoker    Past Surgical History:  Procedure Laterality Date   ABDOMINAL HYSTERECTOMY     BREAST BIOPSY Right 01/02/2016   BREAST EXCISIONAL BIOPSY Right 1979   BREAST IMPLANT REMOVAL Bilateral 10/05/2017   Procedure: REMOVALOF BILATERAL BREAST IMPLANTS;  Surgeon: Wallace Going, DO;  Location: [place];  Service: Plastics;  Laterality: Bilateral;   BREAST LUMPECTOMY Right 01/27/2016   BREAST LUMPECTOMY WITH RADIOACTIVE SEED AND SENTINEL LYMPH NODE BIOPSY Right 01/27/2016   Procedure: BREAST LUMPECTOMY WITH RADIOACTIVE SEED AND SENTINEL LYMPH NODE BIOPSY;  Surgeon: Excell Seltzer, MD;  Location: [place];  Service: General;  Laterality: Right;   BREAST RECONSTRUCTION WITH PLACEMENT OF TISSUE EXPANDER AND FLEX HD (ACELLULAR HYDRATED DERMIS) Bilateral 03/16/2017   Procedure: BILATERAL IMMEDIATE BREAST RECONSTRUCTION WITH PLACEMENT OF TISSUE EXPANDER AND FLEX HD (ACELLULAR HYDRATED DERMIS);  Surgeon: Wallace Going, DO;  Location: [place];  Service: Plastics;  Laterality: Bilateral;   BREAST SURGERY  1979   cyst removed rt breast   CATARACT EXTRACTION W/  INTRAOCULAR LENS IMPLANT     RT EYE   CESAREAN SECTION     x2   EXCISION OF BREAST LESION Left 01/30/2018   Procedure: EXCISION OF LEFT BREAST SCAR;  Surgeon: Wallace Going, DO;  Location: [place];  Service: Plastics;  Laterality: Left;   LATISSIMUS FLAP TO BREAST Right 01/30/2018   Procedure: RIGHT LATISSIMUS MYOCUTANEOUS FLAP FOR RIGHT CHEST WALL RECONSTRUCTION;  Surgeon: Wallace Going, DO;  Location: [place];  Service: Plastics;  Laterality: Right;   MASTECTOMY W/ SENTINEL NODE BIOPSY Bilateral 03/16/2017   Procedure: BILATERAL TOTAL MASTECTOMY WITH LEFT SENTINEL LYMPH NODE BIOPSY;  Surgeon: Excell Seltzer, MD;  Location: [place];  Service: General;  Laterality: Bilateral;   REMOVAL OF BILATERAL TISSUE EXPANDERS WITH PLACEMENT OF BILATERAL BREAST IMPLANTS Bilateral 06/15/2017   Procedure: REMOVAL OF BILATERAL TISSUE EXPANDERS WITH PLACEMENT OF BILATERAL BREAST IMPLANTS;  Surgeon: Wallace Going, DO;  Location: [place];  Service: Plastics;  Laterality: Bilateral;   TOTAL MASTECTOMY Bilateral 03/16/2017   SENTINAL NODE BIOPSY    Current Outpatient Medications on File Prior to Visit  Medication Sig Dispense Refill   albuterol (PROVENTIL) (2.5 MG/3ML) 0.083% nebulizer solution TAKE 3 MLS (2.5 MG TOTAL) BY NEBULIZATION EVERY 6 HOURS AS NEEDED WHEEZING OR SHORTNESS OF BREATH. 300 mL 5   albuterol (VENTOLIN HFA) 108 (90 Base) MCG/ACT inhaler INHALE 1-2 PUFFS EVERY 6  HOURS AS NEEDED FOR WHEEZING. 18 each 5   Ascorbic Acid (VITAMIN C) 1000 MG tablet Take 1,000 mg by mouth 2 (two) times daily.      budesonide-formoterol (SYMBICORT) 80-4.5 MCG/ACT inhaler TAKE 2 PUFFS BY MOUTH TWICE A DAY 10.2 each 3   gabapentin (NEURONTIN) 300 MG capsule Take 1 capsule (300 mg total) by mouth 3 (three) times daily as needed (nerve pain). 90 capsule 3   HYDROcodone-acetaminophen (NORCO) 5-325 MG tablet Take 1 tablet by mouth every 6 (six) hours as needed for moderate pain. 30 tablet 0    KLOR-CON M10 10 MEQ tablet TAKE 1 TABLET BY MOUTH EVERY DAY 30 tablet 1   losartan (COZAAR) 100 MG tablet Take 1 tablet (100 mg total) by mouth daily. 90 tablet 3   magnesium oxide (MAG-OX) 400 (240 Mg) MG tablet Take 1 tablet by mouth daily.     montelukast (SINGULAIR) 10 MG tablet Take 1 tablet (10 mg total) by mouth daily. 30 tablet 11   Multiple Vitamin (MULTIVITAMIN WITH MINERALS) TABS tablet Take 1 tablet by mouth daily.     predniSONE (DELTASONE) 20 MG tablet 3 tabs poqday 1-2, 2 tabs poqday 3-4, 1 tab poqday 5-6 12 tablet 0   tamoxifen (NOLVADEX) 20 MG tablet Take 1 tablet (20 mg total) by mouth at bedtime. 90 tablet 4   triamterene-hydrochlorothiazide (MAXZIDE-25) 37.5-25 MG tablet TAKE 1 TABLET BY MOUTH AT BEDTIME. STOP AMLODIPINE 90 tablet 3   No current facility-administered medications on file prior to visit.   Allergies  Allergen Reactions   Penicillins Swelling, Rash and Other (See Comments)    Older sister died from reaction to penicillin Has patient had a PCN reaction causing immediate rash, facial/tongue/throat swelling, SOB or lightheadedness with hypotension: Yes Has patient had a PCN reaction causing severe rash involving mucus membranes or skin necrosis: No Has patient had a PCN reaction that required hospitalization: No Has patient had a PCN reaction occurring within the last 10 years: No If all of the above answers are "NO", then may proceed with Cephalosporin use.    Sulfa Antibiotics Rash   Levaquin [Levofloxacin] Nausea And Vomiting   Social History   Socioeconomic History   Marital status: Married    Spouse name: Not on file   Number of children: Not on file   Years of education: Not on file   Highest education level: Not on file  Occupational History   Not on file  Tobacco Use   Smoking status: Former    Packs/day: 0.50    Years: 40.00    Total pack years: 20.00    Types: Cigarettes   Smokeless tobacco: Never  Vaping Use   Vaping Use: Never  used  Substance and Sexual Activity   Alcohol use: Yes    Alcohol/week: 0.0 standard drinks of alcohol    Comment: social   Drug use: No   Sexual activity: Yes    Birth control/protection: Surgical    Comment: hysterectomy  Other Topics Concern   Not on file  Social History Narrative   Three story home   Drinks caffeine   Right handed   Social Determinants of Health   Financial Resource Strain: Low Risk  (01/22/2022)   Overall Financial Resource Strain (CARDIA)    Difficulty of Paying Living Expenses: Not hard at all  Food Insecurity: No Food Insecurity (01/22/2022)   Hunger Vital Sign    Worried About Running Out of Food in the Last Year: Never true  Ran Out of Food in the Last Year: Never true  Transportation Needs: No Transportation Needs (01/22/2022)   PRAPARE - Hydrologist (Medical): No    Lack of Transportation (Non-Medical): No  Physical Activity: Insufficiently Active (01/22/2022)   Exercise Vital Sign    Days of Exercise per Week: 3 days    Minutes of Exercise per Session: 30 min  Stress: No Stress Concern Present (01/22/2022)   [place]    Feeling of Stress : Not at all  Social Connections: [place] (01/22/2022)   Social Connection and Isolation Panel [NHANES]    Frequency of Communication with Friends and Family: More than three times a week    Frequency of Social Gatherings with Friends and Family: More than three times a week    Attends Religious Services: More than 4 times per year    Active Member of [employer] or Organizations: Yes    Attends Archivist Meetings: More than 4 times per year    Marital Status: Married  Human resources officer Violence: Not At Risk (01/22/2022)   Humiliation, Afraid, Rape, and Kick questionnaire    Fear of Current or Ex-Partner: No    Emotionally Abused: No    Physically Abused: No    Sexually Abused: No      Review  of Systems  All other systems reviewed and are negative.      Objective:   Physical Exam Vitals reviewed.  Constitutional:      Appearance: Normal appearance.  HENT:     Right Ear: Tympanic membrane and ear canal normal.     Left Ear: Tympanic membrane and ear canal normal.     Nose: Congestion present. No rhinorrhea.     Mouth/Throat:     Pharynx: No oropharyngeal exudate or posterior oropharyngeal erythema.  Eyes:     Conjunctiva/sclera: Conjunctivae normal.  Cardiovascular:     Rate and Rhythm: Normal rate and regular rhythm.     Heart sounds: Normal heart sounds.  Pulmonary:     Effort: Pulmonary effort is normal. Prolonged expiration present.     Breath sounds: Decreased air movement present. Examination of the right-middle field reveals rales. Wheezing, rhonchi and rales present.  Musculoskeletal:     Lumbar back: Tenderness and bony tenderness present. Decreased range of motion. Negative right straight leg raise test and negative left straight leg raise test.  Neurological:     Mental Status: She is alert.           Assessment & Plan:  COPD exacerbation (HCC) Begin prednisone 40 mg daily for 7 days.  Given her allergy profile use doxycycline 100 mg twice daily for 7 days, use albuterol 2 puffs inhaled every 4-6 hours as needed for wheezing

## 2022-06-30 ENCOUNTER — Other Ambulatory Visit: Payer: Self-pay | Admitting: Family Medicine

## 2022-06-30 DIAGNOSIS — J45901 Unspecified asthma with (acute) exacerbation: Secondary | ICD-10-CM

## 2022-06-30 NOTE — Telephone Encounter (Signed)
Requested Prescriptions  Pending Prescriptions Disp Refills  . budesonide-formoterol (SYMBICORT) 80-4.5 MCG/ACT inhaler 10.2 each 3    Sig: TAKE 2 PUFFS BY MOUTH TWICE A DAY     There is no refill protocol information for this order

## 2022-07-06 ENCOUNTER — Ambulatory Visit (INDEPENDENT_AMBULATORY_CARE_PROVIDER_SITE_OTHER): Payer: Medicare HMO | Admitting: Family Medicine

## 2022-07-06 VITALS — BP 108/68 | HR 88 | Wt 115.0 lb

## 2022-07-06 DIAGNOSIS — M5432 Sciatica, left side: Secondary | ICD-10-CM | POA: Diagnosis not present

## 2022-07-06 MED ORDER — MELOXICAM 15 MG PO TABS: 15.0000 mg | ORAL_TABLET | Freq: Every day | ORAL | 0 refills | Status: DC

## 2022-07-06 MED ORDER — GABAPENTIN 100 MG PO CAPS: 100.0000 mg | ORAL_CAPSULE | Freq: Three times a day (TID) | ORAL | 3 refills | Status: DC

## 2022-07-06 NOTE — Progress Notes (Signed)
Subjective:    Patient ID: Krystal Brooks, female    DOB: 1955-12-20, 66 y.o.   MRN: [id number] Patient had an MRI of her back in 2016.  At that time she had a bulging disc at L4-L5 possibly pinching the left-sided nerve root.  Over the last year, she has been dealing with pain in her lower back.  She is also been dealing with left-sided sciatica.  We recently obtained an x-ray that confirmed degenerative disc disease L4-L5 I believe this is most likely source.  She states that at least half of the days of the week, she will have intense pain radiating into her left leg and down her left leg.  She saw no benefit from prednisone.  We will try the patient on gabapentin 300 mg once a day is causing severe nausea and dizziness.  She has been referred to physical therapy but has yet to start.  She is here today to discuss other options Past Medical History:  Diagnosis Date   Adie's pupil    LEFT EYE   Allergy    Asthma    Atherosclerosis of aorta (HCC)    bulge above renal arteries on mri 2016   Cancer ([place]) 2017   right breast ca   Cellulitis    chest   Complication of anesthesia    VERY HARD TO WAKE UP     Eczema    Hypertension    Personal history of radiation therapy    PONV (postoperative nausea and vomiting)    Smoker    Past Surgical History:  Procedure Laterality Date   ABDOMINAL HYSTERECTOMY     BREAST BIOPSY Right 01/02/2016   BREAST EXCISIONAL BIOPSY Right 1979   BREAST IMPLANT REMOVAL Bilateral 10/05/2017   Procedure: REMOVALOF BILATERAL BREAST IMPLANTS;  Surgeon: Wallace Going, DO;  Location: [facility];  Service: Plastics;  Laterality: Bilateral;   BREAST LUMPECTOMY Right 01/27/2016   BREAST LUMPECTOMY WITH RADIOACTIVE SEED AND SENTINEL LYMPH NODE BIOPSY Right 01/27/2016   Procedure: BREAST LUMPECTOMY WITH RADIOACTIVE SEED AND SENTINEL LYMPH NODE BIOPSY;  Surgeon: Excell Seltzer, MD;  Location: [facility];  Service: General;  Laterality:  Right;   BREAST RECONSTRUCTION WITH PLACEMENT OF TISSUE EXPANDER AND FLEX HD (ACELLULAR HYDRATED DERMIS) Bilateral 03/16/2017   Procedure: BILATERAL IMMEDIATE BREAST RECONSTRUCTION WITH PLACEMENT OF TISSUE EXPANDER AND FLEX HD (ACELLULAR HYDRATED DERMIS);  Surgeon: Wallace Going, DO;  Location: [facility];  Service: Plastics;  Laterality: Bilateral;   BREAST SURGERY  1979   cyst removed rt breast   CATARACT EXTRACTION W/ INTRAOCULAR LENS IMPLANT     RT EYE   CESAREAN SECTION     x2   EXCISION OF BREAST LESION Left 01/30/2018   Procedure: EXCISION OF LEFT BREAST SCAR;  Surgeon: Wallace Going, DO;  Location: [facility];  Service: Plastics;  Laterality: Left;   LATISSIMUS FLAP TO BREAST Right 01/30/2018   Procedure: RIGHT LATISSIMUS MYOCUTANEOUS FLAP FOR RIGHT CHEST WALL RECONSTRUCTION;  Surgeon: Wallace Going, DO;  Location: [facility];  Service: Plastics;  Laterality: Right;   MASTECTOMY W/ SENTINEL NODE BIOPSY Bilateral 03/16/2017   Procedure: BILATERAL TOTAL MASTECTOMY WITH LEFT SENTINEL LYMPH NODE BIOPSY;  Surgeon: Excell Seltzer, MD;  Location: [facility];  Service: General;  Laterality: Bilateral;   REMOVAL OF BILATERAL TISSUE EXPANDERS WITH PLACEMENT OF BILATERAL BREAST IMPLANTS Bilateral 06/15/2017   Procedure: REMOVAL OF BILATERAL TISSUE EXPANDERS WITH PLACEMENT OF BILATERAL BREAST IMPLANTS;  Surgeon: Audelia Hives  S, DO;  Location: [facility];  Service: Plastics;  Laterality: Bilateral;   TOTAL MASTECTOMY Bilateral 03/16/2017   SENTINAL NODE BIOPSY    Current Outpatient Medications on File Prior to Visit  Medication Sig Dispense Refill   albuterol (PROVENTIL) (2.5 MG/3ML) 0.083% nebulizer solution TAKE 3 MLS (2.5 MG TOTAL) BY NEBULIZATION EVERY 6 HOURS AS NEEDED WHEEZING OR SHORTNESS OF BREATH. 300 mL 5   albuterol (VENTOLIN HFA) 108 (90 Base) MCG/ACT inhaler INHALE 1-2 PUFFS EVERY 6 HOURS AS NEEDED FOR WHEEZING. 18 each 5   Ascorbic Acid (VITAMIN C) 1000 MG tablet  Take 1,000 mg by mouth 2 (two) times daily.      budesonide-formoterol (SYMBICORT) 80-4.5 MCG/ACT inhaler TAKE 2 PUFFS BY MOUTH TWICE A DAY 10.2 each 3   doxycycline (VIBRA-TABS) 100 MG tablet Take 1 tablet (100 mg total) by mouth 2 (two) times daily. 14 tablet 0   HYDROcodone-acetaminophen (NORCO) 5-325 MG tablet Take 1 tablet by mouth every 6 (six) hours as needed for moderate pain. 30 tablet 0   KLOR-CON M10 10 MEQ tablet TAKE 1 TABLET BY MOUTH EVERY DAY 30 tablet 1   losartan (COZAAR) 100 MG tablet Take 1 tablet (100 mg total) by mouth daily. 90 tablet 3   magnesium oxide (MAG-OX) 400 (240 Mg) MG tablet Take 1 tablet by mouth daily.     montelukast (SINGULAIR) 10 MG tablet Take 1 tablet (10 mg total) by mouth daily. 30 tablet 11   Multiple Vitamin (MULTIVITAMIN WITH MINERALS) TABS tablet Take 1 tablet by mouth daily.     predniSONE (DELTASONE) 20 MG tablet 3 tabs poqday 1-2, 2 tabs poqday 3-4, 1 tab poqday 5-6 12 tablet 0   predniSONE (DELTASONE) 20 MG tablet Take 2 tablets (40 mg total) by mouth daily with breakfast. 14 tablet 0   tamoxifen (NOLVADEX) 20 MG tablet Take 1 tablet (20 mg total) by mouth at bedtime. 90 tablet 4   triamterene-hydrochlorothiazide (MAXZIDE-25) 37.5-25 MG tablet TAKE 1 TABLET BY MOUTH AT BEDTIME. STOP AMLODIPINE 90 tablet 3   No current facility-administered medications on file prior to visit.   Allergies  Allergen Reactions   Penicillins Swelling, Rash and Other (See Comments)    Older sister died from reaction to penicillin Has patient had a PCN reaction causing immediate rash, facial/tongue/throat swelling, SOB or lightheadedness with hypotension: Yes Has patient had a PCN reaction causing severe rash involving mucus membranes or skin necrosis: No Has patient had a PCN reaction that required hospitalization: No Has patient had a PCN reaction occurring within the last 10 years: No If all of the above answers are "NO", then may proceed with Cephalosporin use.     Sulfa Antibiotics Rash   Levaquin [Levofloxacin] Nausea And Vomiting   Social History   Socioeconomic History   Marital status: Married    Spouse name: Not on file   Number of children: Not on file   Years of education: Not on file   Highest education level: Not on file  Occupational History   Not on file  Tobacco Use   Smoking status: Former    Packs/day: 0.50    Years: 40.00    Total pack years: 20.00    Types: Cigarettes   Smokeless tobacco: Never  Vaping Use   Vaping Use: Never used  Substance and Sexual Activity   Alcohol use: Yes    Alcohol/week: 0.0 standard drinks of alcohol    Comment: social   Drug use: No  Sexual activity: Yes    Birth control/protection: Surgical    Comment: hysterectomy  Other Topics Concern   Not on file  Social History Narrative   Three story home   Drinks caffeine   Right handed   Social Determinants of Health   Financial Resource Strain: Low Risk  (01/22/2022)   Overall Financial Resource Strain (CARDIA)    Difficulty of Paying Living Expenses: Not hard at all  Food Insecurity: No Food Insecurity (01/22/2022)   Hunger Vital Sign    Worried About Running Out of Food in the Last Year: Never true    Ran Out of Food in the Last Year: Never true  Transportation Needs: No Transportation Needs (01/22/2022)   PRAPARE - Hydrologist (Medical): No    Lack of Transportation (Non-Medical): No  Physical Activity: Insufficiently Active (01/22/2022)   Exercise Vital Sign    Days of Exercise per Week: 3 days    Minutes of Exercise per Session: 30 min  Stress: No Stress Concern Present (01/22/2022)   [place]    Feeling of Stress : Not at all  Social Connections: [place] (01/22/2022)   Social Connection and Isolation Panel [NHANES]    Frequency of Communication with Friends and Family: More than three times a week    Frequency of Social  Gatherings with Friends and Family: More than three times a week    Attends Religious Services: More than 4 times per year    Active Member of [employer] or Organizations: Yes    Attends Archivist Meetings: More than 4 times per year    Marital Status: Married  Human resources officer Violence: Not At Risk (01/22/2022)   Humiliation, Afraid, Rape, and Kick questionnaire    Fear of Current or Ex-Partner: No    Emotionally Abused: No    Physically Abused: No    Sexually Abused: No      Review of Systems  All other systems reviewed and are negative.      Objective:   Physical Exam Vitals reviewed.  Constitutional:      Appearance: Normal appearance.  Cardiovascular:     Rate and Rhythm: Normal rate and regular rhythm.     Heart sounds: Normal heart sounds.  Pulmonary:     Effort: Pulmonary effort is normal. Prolonged expiration present.     Breath sounds: Decreased air movement present. No wheezing, rhonchi or rales.  Musculoskeletal:     Lumbar back: Tenderness and bony tenderness present. Decreased range of motion. Negative right straight leg raise test and negative left straight leg raise test.  Neurological:     Mental Status: She is alert.           Assessment & Plan:  Left sided sciatica Patient has left-sided sciatica.  She been dealing with this off and on since 2016.  She has confirmed degenerative disc disease at L4-5 I believe this is the most likely source of nerve impingement.  We discussed her options.  These include taking pain medication, gradually increasing gabapentin to reduce the side effects that she is experiencing from, switching to Lyrica, or obtaining an MRI and consulting surgery.  At the present time she wants to try to wean up on the gabapentin so we will switch to 100 mg 3 times a day as needed for pain and add meloxicam 15 mg a day.  If this is not helping, I will slowly uptitrate gabapentin to  300 mg 3 times a day and proceed with an MRI of the  lumbar spine in preparation for surgical consultation versus epidural steroid injection

## 2022-07-13 DIAGNOSIS — M545 Low back pain, unspecified: Secondary | ICD-10-CM | POA: Diagnosis not present

## 2022-07-13 DIAGNOSIS — M5416 Radiculopathy, lumbar region: Secondary | ICD-10-CM | POA: Diagnosis not present

## 2022-07-14 ENCOUNTER — Encounter: Payer: Self-pay | Admitting: Hematology and Oncology

## 2022-07-14 ENCOUNTER — Inpatient Hospital Stay: Payer: Medicare HMO | Admitting: Hematology and Oncology

## 2022-07-14 ENCOUNTER — Inpatient Hospital Stay: Payer: Medicare HMO | Attending: Hematology and Oncology

## 2022-07-14 ENCOUNTER — Other Ambulatory Visit: Payer: Self-pay

## 2022-07-14 ENCOUNTER — Other Ambulatory Visit: Payer: Self-pay | Admitting: *Deleted

## 2022-07-14 VITALS — BP 137/66 | HR 82 | Temp 97.7°F | Resp 16 | Ht 61.0 in | Wt 119.0 lb

## 2022-07-14 DIAGNOSIS — Z800 Family history of malignant neoplasm of digestive organs: Secondary | ICD-10-CM | POA: Insufficient documentation

## 2022-07-14 DIAGNOSIS — Z79899 Other long term (current) drug therapy: Secondary | ICD-10-CM | POA: Insufficient documentation

## 2022-07-14 DIAGNOSIS — I10 Essential (primary) hypertension: Secondary | ICD-10-CM | POA: Insufficient documentation

## 2022-07-14 DIAGNOSIS — Z853 Personal history of malignant neoplasm of breast: Secondary | ICD-10-CM

## 2022-07-14 DIAGNOSIS — C50212 Malignant neoplasm of upper-inner quadrant of left female breast: Secondary | ICD-10-CM | POA: Diagnosis not present

## 2022-07-14 DIAGNOSIS — Z9013 Acquired absence of bilateral breasts and nipples: Secondary | ICD-10-CM | POA: Diagnosis not present

## 2022-07-14 DIAGNOSIS — Z170 Estrogen receptor positive status [ER+]: Secondary | ICD-10-CM

## 2022-07-14 DIAGNOSIS — C50311 Malignant neoplasm of lower-inner quadrant of right female breast: Secondary | ICD-10-CM | POA: Diagnosis not present

## 2022-07-14 DIAGNOSIS — Z87891 Personal history of nicotine dependence: Secondary | ICD-10-CM | POA: Insufficient documentation

## 2022-07-14 DIAGNOSIS — Z923 Personal history of irradiation: Secondary | ICD-10-CM | POA: Diagnosis not present

## 2022-07-14 DIAGNOSIS — Z79810 Long term (current) use of selective estrogen receptor modulators (SERMs): Secondary | ICD-10-CM | POA: Diagnosis not present

## 2022-07-14 LAB — CBC WITH DIFFERENTIAL (CANCER CENTER ONLY)
Abs Immature Granulocytes: 0.05 10*3/uL (ref 0.00–0.07)
Basophils Absolute: 0 10*3/uL (ref 0.0–0.1)
Basophils Relative: 0 %
Eosinophils Absolute: 0.1 10*3/uL (ref 0.0–0.5)
Eosinophils Relative: 1 %
HCT: 30.1 % — ABNORMAL LOW (ref 36.0–46.0)
Hemoglobin: 9.7 g/dL — ABNORMAL LOW (ref 12.0–15.0)
Immature Granulocytes: 1 %
Lymphocytes Relative: 21 %
Lymphs Abs: 1.4 10*3/uL (ref 0.7–4.0)
MCH: 29 pg (ref 26.0–34.0)
MCHC: 32.2 g/dL (ref 30.0–36.0)
MCV: 90.1 fL (ref 80.0–100.0)
Monocytes Absolute: 0.4 10*3/uL (ref 0.1–1.0)
Monocytes Relative: 6 %
Neutro Abs: 4.8 10*3/uL (ref 1.7–7.7)
Neutrophils Relative %: 71 %
Platelet Count: 200 10*3/uL (ref 150–400)
RBC: 3.34 MIL/uL — ABNORMAL LOW (ref 3.87–5.11)
RDW: 14.2 % (ref 11.5–15.5)
WBC Count: 6.7 10*3/uL (ref 4.0–10.5)
nRBC: 0 % (ref 0.0–0.2)

## 2022-07-14 LAB — CMP (CANCER CENTER ONLY)
ALT: 32 U/L (ref 0–44)
AST: 29 U/L (ref 15–41)
Albumin: 3.6 g/dL (ref 3.5–5.0)
Alkaline Phosphatase: 83 U/L (ref 38–126)
Anion gap: 8 (ref 5–15)
BUN: 13 mg/dL (ref 8–23)
CO2: 28 mmol/L (ref 22–32)
Calcium: 9 mg/dL (ref 8.9–10.3)
Chloride: 100 mmol/L (ref 98–111)
Creatinine: 0.9 mg/dL (ref 0.44–1.00)
GFR, Estimated: >60 mL/min (ref >60)
Glucose, Bld: 81 mg/dL (ref 70–99)
Potassium: 3.4 mmol/L — ABNORMAL LOW (ref 3.5–5.1)
Sodium: 136 mmol/L (ref 135–145)
Total Bilirubin: 0.3 mg/dL (ref 0.3–1.2)
Total Protein: 7.1 g/dL (ref 6.5–8.1)

## 2022-07-14 NOTE — Progress Notes (Signed)
[place]  [phone]) [phone] Fax:(336) [phone]     ID: Krystal Brooks DOB: May 02, 1956  MR#: [id number]  OZY#:[id number]  Patient Care Team: Susy Frizzle, MD as PCP - General (Family Medicine) Excell Seltzer, MD (Inactive) as Consulting Physician (General Surgery) Druscilla Brownie, MD as Consulting Physician (Dermatology) Dillingham, [name], DO as Attending Physician (Plastic Surgery) Magrinat, Virgie Dad, MD (Inactive) as Consulting Physician (Oncology) Delice Bison, Charlestine Massed, NP as Nurse Practitioner (Hematology and Oncology) Edythe Clarity, [facility] as Pharmacist (Pharmacist) OTHER MD:  CHIEF COMPLAINT: Recurrent estrogen receptor positive breast cancer (s/p bilateral mastectomies)  CURRENT TREATMENT: Tamoxifen, total 10 years   INTERVAL HISTORY: Krystal Brooks returns today for follow-up of her estrogen receptor positive breast cancer.  She continues on tamoxifen.  She is tolerating this very well.  She is willing to continue this for 10 years as recommended by Dr. Jana Hakim.  She denies any changes in her health since her last visit. No lower extremity swelling, breakthrough vaginal bleeding, change in breathing. Rest of the pertinent 10 point ROS reviewed and negative    COVID 19 VACCINATION STATUS: Pfizer x3, last 05/2020; infection 02/2021   BREAST CANCER HISTORY: From Dr Krystal Brooks 01/14/16 note:   "Krystal Brooks 65 y.o. female is here because of Her newly diagnosed right breast cancer. She is accompanied by her husband and of friend to our multidisciplinary rest clinic today.   This was found by screening mammogram,  she denies any palpable mass, skin change or nipple discharge, or ulcer constitutional symptoms before the screening.  Her prior mammo 6 years ago. She had a right breast cyst removed in 1979. "   Malignant neoplasm of lower-inner quadrant of right breast of female, estrogen receptor positive ([place])    12/29/2015 Mammogram      Diagnostic  right mammogram showed a 2.3 x 2.1 x 2.2 cm group of coarse calcification within the medial slightly lower right breast.         01/02/2016 Initial Diagnosis      Breast cancer of lower-inner quadrant of right female breast ([place])         01/02/2016 Initial Biopsy      right breast LIQ core needle biopsy showed LCIS, with a small focus of invasive lobular carcinoma, grade 2.          01/02/2016 Receptors her2      ER 10% positive, moderate staining. PR negative. Insufficient tissue for HER-2 testing.         01/27/2016 Surgery      Right breast lumpectomy and sentinel lymph node biopsy ([place])         01/27/2016 Pathology Results      Right breast lumpectomy showed pleomorphic variant of lobular carcinoma in situ, and scattered microscopic foci of invasive lobular carcinoma (all less than 0.1 cm) in different sections, total 0.6 cm. 0/2 SLN.          01/27/2016 Receptors her2      The surgical sample of the invasive carcinoma was not sufficient for Her2 test and other additional studies.         03/09/2016 - 04/19/2016 Radiation Therapy      Adjuvant breast radiation Krystal Brooks). Right breast: 50 Gy in 25 fractions. Right breast "boost": 10 Gy in 5 fractions.           04/2016 -  Anti-estrogen oral therapy      Anastrozole 1 mg daily. Planned duration of therapy: 5 years  PAST MEDICAL HISTORY: Past Medical History:  Diagnosis Date   Adie's pupil    LEFT EYE   Allergy    Asthma    Atherosclerosis of aorta (HCC)    bulge above renal arteries on mri 2016   Cancer ([place]) 2017   right breast ca   Cellulitis    chest   Complication of anesthesia    VERY HARD TO WAKE UP     Eczema    Hypertension    Personal history of radiation therapy    PONV (postoperative nausea and vomiting)    Smoker     PAST SURGICAL HISTORY: Past Surgical History:  Procedure Laterality Date   ABDOMINAL HYSTERECTOMY     BREAST BIOPSY Right 01/02/2016   BREAST EXCISIONAL BIOPSY Right 1979    BREAST IMPLANT REMOVAL Bilateral 10/05/2017   Procedure: REMOVALOF BILATERAL BREAST IMPLANTS;  Surgeon: Wallace Going, DO;  Location: [place];  Service: Plastics;  Laterality: Bilateral;   BREAST LUMPECTOMY Right 01/27/2016   BREAST LUMPECTOMY WITH RADIOACTIVE SEED AND SENTINEL LYMPH NODE BIOPSY Right 01/27/2016   Procedure: BREAST LUMPECTOMY WITH RADIOACTIVE SEED AND SENTINEL LYMPH NODE BIOPSY;  Surgeon: Excell Seltzer, MD;  Location: [place];  Service: General;  Laterality: Right;   BREAST RECONSTRUCTION WITH PLACEMENT OF TISSUE EXPANDER AND FLEX HD (ACELLULAR HYDRATED DERMIS) Bilateral 03/16/2017   Procedure: BILATERAL IMMEDIATE BREAST RECONSTRUCTION WITH PLACEMENT OF TISSUE EXPANDER AND FLEX HD (ACELLULAR HYDRATED DERMIS);  Surgeon: Wallace Going, DO;  Location: [place];  Service: Plastics;  Laterality: Bilateral;   BREAST SURGERY  1979   cyst removed rt breast   CATARACT EXTRACTION W/ INTRAOCULAR LENS IMPLANT     RT EYE   CESAREAN SECTION     x2   EXCISION OF BREAST LESION Left 01/30/2018   Procedure: EXCISION OF LEFT BREAST SCAR;  Surgeon: Wallace Going, DO;  Location: [place];  Service: Plastics;  Laterality: Left;   LATISSIMUS FLAP TO BREAST Right 01/30/2018   Procedure: RIGHT LATISSIMUS MYOCUTANEOUS FLAP FOR RIGHT CHEST WALL RECONSTRUCTION;  Surgeon: Wallace Going, DO;  Location: [place];  Service: Plastics;  Laterality: Right;   MASTECTOMY W/ SENTINEL NODE BIOPSY Bilateral 03/16/2017   Procedure: BILATERAL TOTAL MASTECTOMY WITH LEFT SENTINEL LYMPH NODE BIOPSY;  Surgeon: Excell Seltzer, MD;  Location: [place];  Service: General;  Laterality: Bilateral;   REMOVAL OF BILATERAL TISSUE EXPANDERS WITH PLACEMENT OF BILATERAL BREAST IMPLANTS Bilateral 06/15/2017   Procedure: REMOVAL OF BILATERAL TISSUE EXPANDERS WITH PLACEMENT OF BILATERAL BREAST IMPLANTS;  Surgeon: Wallace Going, DO;  Location: [place];  Service:  Plastics;  Laterality: Bilateral;   TOTAL MASTECTOMY Bilateral 03/16/2017   SENTINAL NODE BIOPSY    FAMILY HISTORY Family History  Problem Relation Age of Onset   Hearing loss Mother    Asthma Father    Cancer Father        melanoma, metastases    Diabetes Father    Heart disease Father    Early death Sister    Early death Paternal Grandfather    Cancer Maternal Grandmother 88       colon cancer    Breast cancer Neg Hx    Ovarian cancer Neg Hx   The patient's father died at age 66. He had been diagnosed with melanoma in 1 year. That was not the cause of death. The patient's mother is living at age 63 as of May 2018. The patient has one brother, no sisters.  There is no history of breast or ovarian cancer in the family, and no other melanoma history.   GYNECOLOGIC HISTORY:  No LMP recorded. Patient has had a hysterectomy. 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 She frequently keeps her 2 grandchildren. Her husband Clair Gulling is a retired Airline pilot. He now owns a business where they prepare the connection between the front living in/RV portion and a horse trailer: They do the hitch combination. Daughter April lives in [place] where she works as a Statistician. Daughter Ailene Ravel lives in [place] where she is an Forensic scientist for [employer]. The patient has 4 grandchildren. She is a Tourist information centre manager.    ADVANCED DIRECTIVES: The patient's husband is automatically her healthcare power of attorney   HEALTH MAINTENANCE: Social History   Tobacco Use   Smoking status: Former    Packs/day: 0.50    Years: 40.00    Total pack years: 20.00    Types: Cigarettes   Smokeless tobacco: Never  Vaping Use   Vaping Use: Never used  Substance Use Topics   Alcohol use: Yes     Alcohol/week: 0.0 standard drinks of alcohol    Comment: social   Drug use: No     Colonoscopy:  PAP:  Bone density:05/24/2016 showed a T score of -0.6 normal   Allergies  Allergen Reactions   Penicillins Swelling, Rash and Other (See Comments)    Older sister died from reaction to penicillin Has patient had a PCN reaction causing immediate rash, facial/tongue/throat swelling, SOB or lightheadedness with hypotension: Yes Has patient had a PCN reaction causing severe rash involving mucus membranes or skin necrosis: No Has patient had a PCN reaction that required hospitalization: No Has patient had a PCN reaction occurring within the last 10 years: No If all of the above answers are "NO", then may proceed with Cephalosporin use.    Sulfa Antibiotics Rash   Levaquin [Levofloxacin] Nausea And Vomiting    Current Outpatient Medications  Medication Sig Dispense Refill   albuterol (PROVENTIL) (2.5 MG/3ML) 0.083% nebulizer solution TAKE 3 MLS (2.5 MG TOTAL) BY NEBULIZATION EVERY 6 HOURS AS NEEDED WHEEZING OR SHORTNESS OF BREATH. 300 mL 5   albuterol (VENTOLIN HFA) 108 (90 Base) MCG/ACT inhaler INHALE 1-2 PUFFS EVERY 6 HOURS AS NEEDED FOR WHEEZING. 18 each 5   Ascorbic Acid (VITAMIN C) 1000 MG tablet Take 1,000 mg by mouth 2 (two) times daily.      budesonide-formoterol (SYMBICORT) 80-4.5 MCG/ACT inhaler TAKE 2 PUFFS BY MOUTH TWICE A DAY 10.2 each 3   doxycycline (VIBRA-TABS) 100 MG tablet Take 1 tablet (100 mg total) by mouth 2 (two) times daily. 14 tablet 0   gabapentin (NEURONTIN) 100 MG capsule Take 1 capsule (100 mg total) by mouth 3 (three) times daily. 90 capsule 3   HYDROcodone-acetaminophen (NORCO) 5-325 MG tablet Take 1 tablet by mouth every 6 (six) hours as needed for moderate pain. 30 tablet 0   KLOR-CON M10 10 MEQ tablet TAKE 1 TABLET BY MOUTH EVERY DAY 30 tablet 1   losartan (COZAAR) 100 MG tablet Take 1 tablet (100 mg total) by mouth daily. 90 tablet 3   magnesium oxide  (MAG-OX) 400 (240 Mg) MG tablet Take 1 tablet by mouth daily.     meloxicam (MOBIC) 15  MG tablet Take 1 tablet (15 mg total) by mouth daily. 30 tablet 0   montelukast (SINGULAIR) 10 MG tablet Take 1 tablet (10 mg total) by mouth daily. 30 tablet 11   Multiple Vitamin (MULTIVITAMIN WITH MINERALS) TABS tablet Take 1 tablet by mouth daily.     predniSONE (DELTASONE) 20 MG tablet 3 tabs poqday 1-2, 2 tabs poqday 3-4, 1 tab poqday 5-6 12 tablet 0   predniSONE (DELTASONE) 20 MG tablet Take 2 tablets (40 mg total) by mouth daily with breakfast. 14 tablet 0   tamoxifen (NOLVADEX) 20 MG tablet Take 1 tablet (20 mg total) by mouth at bedtime. 90 tablet 4   triamterene-hydrochlorothiazide (MAXZIDE-25) 37.5-25 MG tablet TAKE 1 TABLET BY MOUTH AT BEDTIME. STOP AMLODIPINE 90 tablet 3   No current facility-administered medications for this visit.    OBJECTIVE: White woman in no acute distress  Vitals:   07/14/22 1151  BP: 137/66  Pulse: 82  Resp: 16  Temp: 97.7 F (36.5 C)  SpO2: 97%      Body mass index is 22.48 kg/m.    ECOG FS:1 - Symptomatic but completely ambulatory  Physical Exam Constitutional:      Appearance: Normal appearance.  Cardiovascular:     Rate and Rhythm: Normal rate and regular rhythm.  Chest:     Comments: She is status post bilateral mastectomies.  No concern for recurrence or regional adenopathy. Musculoskeletal:        General: No swelling.     Cervical back: Normal range of motion and neck supple. No rigidity.  Lymphadenopathy:     Cervical: No cervical adenopathy.  Neurological:     Mental Status: She is alert.     LAB RESULTS:  CMP     Component Value Date/Time   NA 136 07/14/2022 1141   NA 137 07/13/2017 1035   K 3.4 (L) 07/14/2022 1141   K 4.3 07/13/2017 1035   CL 100 07/14/2022 1141   CO2 28 07/14/2022 1141   CO2 27 07/13/2017 1035   GLUCOSE 81 07/14/2022 1141   GLUCOSE 94 07/13/2017 1035   BUN 13 07/14/2022 1141   BUN 15.4 07/13/2017 1035    CREATININE 0.90 07/14/2022 1141   CREATININE 1.02 05/11/2022 0814   CREATININE 0.8 07/13/2017 1035   CALCIUM 9.0 07/14/2022 1141   CALCIUM 9.3 07/13/2017 1035   PROT 7.1 07/14/2022 1141   PROT 7.7 07/13/2017 1035   ALBUMIN 3.6 07/14/2022 1141   ALBUMIN 3.5 07/13/2017 1035   AST 29 07/14/2022 1141   AST 15 07/13/2017 1035   ALT 32 07/14/2022 1141   ALT 14 07/13/2017 1035   ALKPHOS 83 07/14/2022 1141   ALKPHOS 73 07/13/2017 1035   BILITOT 0.3 07/14/2022 1141   BILITOT 0.22 07/13/2017 1035   GFRNONAA >60 07/14/2022 1141   GFRNONAA 64 12/15/2017 1541   GFRAA >60 06/06/2019 1102   GFRAA 50 (L) 06/20/2018 0930   GFRAA 74 12/15/2017 1541    No results found for: "TOTALPROTELP", "ALBUMINELP", "A1GS", "A2GS", "BETS", "BETA2SER", "GAMS", "MSPIKE", "SPEI"  No results found for: "KPAFRELGTCHN", "LAMBDASER", "KAPLAMBRATIO"  Lab Results  Component Value Date   WBC 6.7 07/14/2022   NEUTROABS 4.8 07/14/2022   HGB 9.7 (L) 07/14/2022   HCT 30.1 (L) 07/14/2022   MCV 90.1 07/14/2022   PLT 200 07/14/2022      Chemistry      Component Value Date/Time   NA 136 07/14/2022 1141   NA 137 07/13/2017 1035   K 3.4 (L)  07/14/2022 1141   K 4.3 07/13/2017 1035   CL 100 07/14/2022 1141   CO2 28 07/14/2022 1141   CO2 27 07/13/2017 1035   BUN 13 07/14/2022 1141   BUN 15.4 07/13/2017 1035   CREATININE 0.90 07/14/2022 1141   CREATININE 1.02 05/11/2022 0814   CREATININE 0.8 07/13/2017 1035      Component Value Date/Time   CALCIUM 9.0 07/14/2022 1141   CALCIUM 9.3 07/13/2017 1035   ALKPHOS 83 07/14/2022 1141   ALKPHOS 73 07/13/2017 1035   AST 29 07/14/2022 1141   AST 15 07/13/2017 1035   ALT 32 07/14/2022 1141   ALT 14 07/13/2017 1035   BILITOT 0.3 07/14/2022 1141   BILITOT 0.22 07/13/2017 1035       No results found for: "LABCA2"  No components found for: "OYDXAJ287"  No results for input(s): "INR" in the last 168 hours.  Urinalysis No results found for: "COLORURINE",  "APPEARANCEUR", "LABSPEC", "PHURINE", "GLUCOSEU", "HGBUR", "BILIRUBINUR", "KETONESUR", "PROTEINUR", "UROBILINOGEN", "NITRITE", "LEUKOCYTESUR"   STUDIES: No results found.   ELIGIBLE FOR AVAILABLE RESEARCH PROTOCOL:   ASSESSMENT: 66 y.o. [place] woman, with bilateral breast cancer   (1) status post right lumpectomy and sentinel lymph node sampling 01/27/2016 for lobular carcinoma in situ, pleomorphic variant, with multiple foci of invasive lobular carcinoma, all less than a millimeter, but with negative margins and both sentinel lymph nodes negative, invasive disease being estrogen receptor positive, HER-2 not tested (pT1 [mic] pN0}   (2) adjuvant radiation 03/09/2016 through 04/19/2016  The patient initially received a dose of 50 Gy in 25 fractions to the breast using whole-breast tangent fields. This was delivered using a 3-D conformal technique. The patient then received a boost to the seroma. This delivered an additional 10 Gy in 5 fractions using an en face electron field due to the depth of the seroma. The total dose was 60 Gy.   (3) anastrozole started June 2017, discontinued July 2018   (4) status post left breast lower inner quadrant biopsy 12/29/2016 for a clinical T1c N0  invasive lobular breast cancer, grade 2, estrogen receptor positive, progesterone receptor negative HER-2 nonamplified, with a signals ratio 1.35 and the number per cell 2.70.  (5) intermediate Oncotype score of 22 predicts a 10 year risk of recurrence outside the breast of 14% if the patient's only systemic therapy is tamoxifen for 5 years.  (a) the patient opted against chemotherapy Lovena Le Rx results confirm choice)  (6) status post bilateral mastectomies and left sentinel lymph node sampling 03/16/2017 showing  (a) on the right, no evidence of carcinoma  (b) on the left, a pT2 pN0, stage IIA invasive lobular carcinoma, grade 2, with negative margins.  (7) had immediate expander placement at the time of  bilateral mastectomies  (a) definitive implant placement with bilateral capsulotomies June 15, 2017, with benign pathology  (b) implants removed February 2019 because of chest discomfort  (c) status post right latissimus flap reconstruction and left capsulotomy 01/30/2018 with benign pathology  (d) decided to have all implants removed with no further reconstruction.  (Per Dr. Dalbert Mayotte note 03/06/2019).  (8) started tamoxifen 04/30/2017-- plan for total 10 years of antiestrogens given lobular histology   PLAN: Ms. Cinderella will continue on tamoxifen, anticipate 10 years of antiestrogen therapy per discussion with Dr. Jana Hakim.  She is tolerating this very well with no toxicity. She had bilateral mastectomies hence no role for mammograms. No concerning review of systems or physical examination findings for recurrence. She will continue tamoxifen as recommended and  return to clinic in 1 year or sooner as needed.  Total time spent 30 minutes including history, physical exam, review of records, counseling and coordination of care   *Total Encounter Time as defined by the Centers for Medicare and Medicaid Services includes, in addition to the face-to-face time of a patient visit (documented in the note above) non-face-to-face time: obtaining and reviewing outside history, ordering and reviewing medications, tests or procedures, care coordination (communications with other health care professionals or caregivers) and documentation in the medical record.

## 2022-07-15 ENCOUNTER — Other Ambulatory Visit: Payer: Self-pay | Admitting: *Deleted

## 2022-07-15 MED ORDER — TAMOXIFEN CITRATE 20 MG PO TABS: 20.0000 mg | ORAL_TABLET | Freq: Every day | ORAL | 4 refills | Status: DC

## 2022-07-20 DIAGNOSIS — M545 Low back pain, unspecified: Secondary | ICD-10-CM | POA: Diagnosis not present

## 2022-07-20 DIAGNOSIS — M5416 Radiculopathy, lumbar region: Secondary | ICD-10-CM | POA: Diagnosis not present

## 2022-07-26 DIAGNOSIS — M545 Low back pain, unspecified: Secondary | ICD-10-CM | POA: Diagnosis not present

## 2022-07-26 DIAGNOSIS — M5416 Radiculopathy, lumbar region: Secondary | ICD-10-CM | POA: Diagnosis not present

## 2022-07-28 ENCOUNTER — Other Ambulatory Visit: Payer: Self-pay

## 2022-07-28 ENCOUNTER — Inpatient Hospital Stay: Payer: Medicare HMO

## 2022-07-28 DIAGNOSIS — Z800 Family history of malignant neoplasm of digestive organs: Secondary | ICD-10-CM | POA: Diagnosis not present

## 2022-07-28 DIAGNOSIS — Z170 Estrogen receptor positive status [ER+]: Secondary | ICD-10-CM | POA: Diagnosis not present

## 2022-07-28 DIAGNOSIS — Z79810 Long term (current) use of selective estrogen receptor modulators (SERMs): Secondary | ICD-10-CM | POA: Diagnosis not present

## 2022-07-28 DIAGNOSIS — I10 Essential (primary) hypertension: Secondary | ICD-10-CM | POA: Diagnosis not present

## 2022-07-28 DIAGNOSIS — Z79899 Other long term (current) drug therapy: Secondary | ICD-10-CM | POA: Diagnosis not present

## 2022-07-28 DIAGNOSIS — Z923 Personal history of irradiation: Secondary | ICD-10-CM | POA: Diagnosis not present

## 2022-07-28 DIAGNOSIS — C50212 Malignant neoplasm of upper-inner quadrant of left female breast: Secondary | ICD-10-CM

## 2022-07-28 DIAGNOSIS — C50311 Malignant neoplasm of lower-inner quadrant of right female breast: Secondary | ICD-10-CM | POA: Diagnosis not present

## 2022-07-28 DIAGNOSIS — Z9013 Acquired absence of bilateral breasts and nipples: Secondary | ICD-10-CM | POA: Diagnosis not present

## 2022-07-28 DIAGNOSIS — Z87891 Personal history of nicotine dependence: Secondary | ICD-10-CM | POA: Diagnosis not present

## 2022-07-28 LAB — CBC WITH DIFFERENTIAL/PLATELET
Abs Immature Granulocytes: 0.07 10*3/uL (ref 0.00–0.07)
Basophils Absolute: 0.1 10*3/uL (ref 0.0–0.1)
Basophils Relative: 1 %
Eosinophils Absolute: 0 10*3/uL (ref 0.0–0.5)
Eosinophils Relative: 0 %
HCT: 31.3 % — ABNORMAL LOW (ref 36.0–46.0)
Hemoglobin: 10.2 g/dL — ABNORMAL LOW (ref 12.0–15.0)
Immature Granulocytes: 1 %
Lymphocytes Relative: 22 %
Lymphs Abs: 2 10*3/uL (ref 0.7–4.0)
MCH: 29.3 pg (ref 26.0–34.0)
MCHC: 32.6 g/dL (ref 30.0–36.0)
MCV: 89.9 fL (ref 80.0–100.0)
Monocytes Absolute: 0.8 10*3/uL (ref 0.1–1.0)
Monocytes Relative: 9 %
Neutro Abs: 6 10*3/uL (ref 1.7–7.7)
Neutrophils Relative %: 67 %
Platelets: 299 10*3/uL (ref 150–400)
RBC: 3.48 MIL/uL — ABNORMAL LOW (ref 3.87–5.11)
RDW: 13.9 % (ref 11.5–15.5)
WBC: 9 10*3/uL (ref 4.0–10.5)
nRBC: 0 % (ref 0.0–0.2)

## 2022-07-28 LAB — IRON AND IRON BINDING CAPACITY (CC-WL,HP ONLY)
Iron: 49 ug/dL (ref 28–170)
Saturation Ratios: 14 % (ref 10.4–31.8)
TIBC: 350 ug/dL (ref 250–450)
UIBC: 301 ug/dL (ref 148–442)

## 2022-07-28 LAB — VITAMIN B12: Vitamin B-12: 410 pg/mL (ref 180–914)

## 2022-07-28 LAB — FERRITIN: Ferritin: 450 ng/mL — ABNORMAL HIGH (ref 11–307)

## 2022-07-29 ENCOUNTER — Inpatient Hospital Stay (HOSPITAL_BASED_OUTPATIENT_CLINIC_OR_DEPARTMENT_OTHER): Payer: Medicare HMO | Admitting: Hematology and Oncology

## 2022-07-29 DIAGNOSIS — D649 Anemia, unspecified: Secondary | ICD-10-CM | POA: Diagnosis not present

## 2022-07-29 LAB — FOLATE RBC
Folate, Hemolysate: 388 ng/mL
Folate, RBC: 1302 ng/mL (ref >498)
Hematocrit: 29.8 % — ABNORMAL LOW (ref 34.0–46.6)

## 2022-07-29 NOTE — Progress Notes (Signed)
[facility]  [phone] [phone]     ID: Krystal Brooks DOB: 1955/11/24  MR#: [id number]  EVO#:[id number]  Patient Care Team: Susy Frizzle, MD as PCP - General (Family Medicine) Excell Seltzer, MD (Inactive) as Consulting Physician (General Surgery) Druscilla Brownie, MD as Consulting Physician (Dermatology) Dillingham, [name], DO as Attending Physician (Plastic Surgery) Magrinat, Virgie Dad, MD (Inactive) as Consulting Physician (Oncology) Delice Bison, Charlestine Massed, NP as Nurse Practitioner (Hematology and Oncology) Edythe Clarity, [facility] as Pharmacist (Pharmacist)  CHIEF COMPLAINT: Recurrent estrogen receptor positive breast cancer (s/p bilateral mastectomies)  CURRENT TREATMENT: Tamoxifen, total 10 years   INTERVAL HISTORY:  [name] returns today for follow-up of her estrogen receptor positive breast cancer. She is here for a telephone visit to review her labs from yesterday.  When she last came a couple weeks ago, she was found to have anemia.  Hence have requested additional labs.  She is here to review them.  Since last visit, she has been working with physical therapy and her back pain has been tremendously better. Previously she was taking almost 16 tablets of ibuprofen daily and had severe gastritis from it.  This has also improved.  She has quit taking ibuprofen.  Rest of the pertinent 10 point ROS reviewed and negative.    COVID 19 VACCINATION STATUS: [place] x3, last 05/2020; infection 02/2021   BREAST CANCER HISTORY: From Dr Ernestina Penna 01/14/16 note:   "Krystal Brooks 66 y.o. female is here because of Her newly diagnosed right breast cancer. She is accompanied by her husband and of friend to our multidisciplinary rest clinic today.   This was found by screening mammogram,  she denies any palpable mass, skin change or nipple discharge, or ulcer constitutional symptoms before the screening.  Her prior mammo 6 years ago. She had a right  breast cyst removed in 1979. "   Malignant neoplasm of lower-inner quadrant of right breast of female, estrogen receptor positive ([facility])    12/29/2015 Mammogram      Diagnostic right mammogram showed a 2.3 x 2.1 x 2.2 cm group of coarse calcification within the medial slightly lower right breast.         01/02/2016 Initial Diagnosis      Breast cancer of lower-inner quadrant of right female breast ([place])         01/02/2016 Initial Biopsy      right breast LIQ core needle biopsy showed LCIS, with a small focus of invasive lobular carcinoma, grade 2.          01/02/2016 Receptors her2      ER 10% positive, moderate staining. PR negative. Insufficient tissue for HER-2 testing.         01/27/2016 Surgery      Right breast lumpectomy and sentinel lymph node biopsy ([place])         01/27/2016 Pathology Results      Right breast lumpectomy showed pleomorphic variant of lobular carcinoma in situ, and scattered microscopic foci of invasive lobular carcinoma (all less than 0.1 cm) in different sections, total 0.6 cm. 0/2 SLN.          01/27/2016 Receptors her2      The surgical sample of the invasive carcinoma was not sufficient for Her2 test and other additional studies.         03/09/2016 - 04/19/2016 Radiation Therapy      Adjuvant breast radiation [name]). Right breast: 50 Gy in 25 fractions. Right breast "boost": 10 Gy  in 5 fractions.           04/2016 -  Anti-estrogen oral therapy      Anastrozole 1 mg daily. Planned duration of therapy: 5 years        PAST MEDICAL HISTORY: Past Medical History:  Diagnosis Date   Adie's pupil    LEFT EYE   Allergy    Asthma    Atherosclerosis of aorta (HCC)    bulge above renal arteries on mri 2016   Cancer ([place]) 2017   right breast ca   Cellulitis    chest   Complication of anesthesia    VERY HARD TO WAKE UP     Eczema    Hypertension    Personal history of radiation therapy    PONV (postoperative nausea and vomiting)    Smoker     PAST  SURGICAL HISTORY: Past Surgical History:  Procedure Laterality Date   ABDOMINAL HYSTERECTOMY     BREAST BIOPSY Right 01/02/2016   BREAST EXCISIONAL BIOPSY Right 1979   BREAST IMPLANT REMOVAL Bilateral 10/05/2017   Procedure: REMOVALOF BILATERAL BREAST IMPLANTS;  Surgeon: Wallace Going, DO;  Location: [place] [facility];  Service: Plastics;  Laterality: Bilateral;   BREAST LUMPECTOMY Right 01/27/2016   BREAST LUMPECTOMY WITH RADIOACTIVE SEED AND SENTINEL LYMPH NODE BIOPSY Right 01/27/2016   Procedure: BREAST LUMPECTOMY WITH RADIOACTIVE SEED AND SENTINEL LYMPH NODE BIOPSY;  Surgeon: Excell Seltzer, MD;  Location: [facility];  Service: General;  Laterality: Right;   BREAST RECONSTRUCTION WITH PLACEMENT OF TISSUE EXPANDER AND FLEX HD (ACELLULAR HYDRATED DERMIS) Bilateral 03/16/2017   Procedure: BILATERAL IMMEDIATE BREAST RECONSTRUCTION WITH PLACEMENT OF TISSUE EXPANDER AND FLEX HD (ACELLULAR HYDRATED DERMIS);  Surgeon: Wallace Going, DO;  Location: [place];  Service: Plastics;  Laterality: Bilateral;   BREAST SURGERY  1979   cyst removed rt breast   CATARACT EXTRACTION W/ INTRAOCULAR LENS IMPLANT     RT EYE   CESAREAN SECTION     x2   EXCISION OF BREAST LESION Left 01/30/2018   Procedure: EXCISION OF LEFT BREAST SCAR;  Surgeon: Wallace Going, DO;  Location: [facility];  Service: Plastics;  Laterality: Left;   LATISSIMUS FLAP TO BREAST Right 01/30/2018   Procedure: RIGHT LATISSIMUS MYOCUTANEOUS FLAP FOR RIGHT CHEST WALL RECONSTRUCTION;  Surgeon: Wallace Going, DO;  Location: [facility];  Service: Plastics;  Laterality: Right;   MASTECTOMY W/ SENTINEL NODE BIOPSY Bilateral 03/16/2017   Procedure: BILATERAL TOTAL MASTECTOMY WITH LEFT SENTINEL LYMPH NODE BIOPSY;  Surgeon: Excell Seltzer, MD;  Location: [place];  Service: General;  Laterality: Bilateral;   REMOVAL OF BILATERAL TISSUE EXPANDERS WITH PLACEMENT OF BILATERAL BREAST IMPLANTS Bilateral 06/15/2017    Procedure: REMOVAL OF BILATERAL TISSUE EXPANDERS WITH PLACEMENT OF BILATERAL BREAST IMPLANTS;  Surgeon: Wallace Going, DO;  Location: [facility];  Service: Plastics;  Laterality: Bilateral;   TOTAL MASTECTOMY Bilateral 03/16/2017   SENTINAL NODE BIOPSY    FAMILY HISTORY Family History  Problem Relation Age of Onset   Hearing loss Mother    Asthma Father    Cancer Father        melanoma, metastases    Diabetes Father    Heart disease Father    Early death Sister    Early death Paternal Grandfather    Cancer Maternal Grandmother 88       colon cancer    Breast cancer Neg Hx    Ovarian cancer Neg Hx   The  patient's father died at age 74. He had been diagnosed with melanoma in 1 year. That was not the cause of death. The patient's mother is living at age 69 as of May 2018. The patient has one brother, no sisters. There is no history of breast or ovarian cancer in the family, and no other melanoma history.  GYNECOLOGIC HISTORY:  No LMP recorded. Patient has had a hysterectomy. 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 She frequently keeps her 2 grandchildren. Her husband Clair Gulling is a retired Airline pilot. He now owns a business where they prepare the connection between the front living in/RV portion and a horse trailer: They do the hitch combination. Daughter April lives in [place] where she works as a Statistician. Daughter Ailene Ravel lives in [place] where she is an Forensic scientist for [employer]. The patient has 4 grandchildren. She is a Tourist information centre manager.    ADVANCED DIRECTIVES: The patient's husband is automatically her healthcare power of attorney   HEALTH MAINTENANCE: Social History   Tobacco Use   Smoking status: Former    Packs/day: 0.50    Years:  40.00    Total pack years: 20.00    Types: Cigarettes   Smokeless tobacco: Never  Vaping Use   Vaping Use: Never used  Substance Use Topics   Alcohol use: Yes    Alcohol/week: 0.0 standard drinks of alcohol    Comment: social   Drug use: No     Colonoscopy:  PAP:  Bone density:05/24/2016 showed a T score of -0.6 normal   Allergies  Allergen Reactions   Penicillins Swelling, Rash and Other (See Comments)    Older sister died from reaction to penicillin Has patient had a PCN reaction causing immediate rash, facial/tongue/throat swelling, SOB or lightheadedness with hypotension: Yes Has patient had a PCN reaction causing severe rash involving mucus membranes or skin necrosis: No Has patient had a PCN reaction that required hospitalization: No Has patient had a PCN reaction occurring within the last 10 years: No If all of the above answers are "NO", then may proceed with Cephalosporin use.    Sulfa Antibiotics Rash   Levaquin [Levofloxacin] Nausea And Vomiting    Current Outpatient Medications  Medication Sig Dispense Refill   albuterol (PROVENTIL) (2.5 MG/3ML) 0.083% nebulizer solution TAKE 3 MLS (2.5 MG TOTAL) BY NEBULIZATION EVERY 6 HOURS AS NEEDED WHEEZING OR SHORTNESS OF BREATH. 300 mL 5   albuterol (VENTOLIN HFA) 108 (90 Base) MCG/ACT inhaler INHALE 1-2 PUFFS EVERY 6 HOURS AS NEEDED FOR WHEEZING. 18 each 5   Ascorbic Acid (VITAMIN C) 1000 MG tablet Take 1,000 mg by mouth 2 (two) times daily.      budesonide-formoterol (SYMBICORT) 80-4.5 MCG/ACT inhaler TAKE 2 PUFFS BY MOUTH TWICE A DAY 10.2 each 3   doxycycline (VIBRA-TABS) 100 MG tablet Take 1 tablet (100 mg total) by mouth 2 (two) times daily. 14 tablet 0   gabapentin (NEURONTIN) 100 MG capsule Take 1 capsule (100 mg total) by mouth 3 (three) times daily. 90 capsule 3   HYDROcodone-acetaminophen (NORCO) 5-325 MG tablet Take 1 tablet by mouth every 6 (six) hours as needed for moderate pain. 30 tablet 0   KLOR-CON M10 10 MEQ  tablet TAKE 1 TABLET BY MOUTH EVERY DAY 30 tablet 1  losartan (COZAAR) 100 MG tablet Take 1 tablet (100 mg total) by mouth daily. 90 tablet 3   magnesium oxide (MAG-OX) 400 (240 Mg) MG tablet Take 1 tablet by mouth daily.     meloxicam (MOBIC) 15 MG tablet Take 1 tablet (15 mg total) by mouth daily. 30 tablet 0   montelukast (SINGULAIR) 10 MG tablet Take 1 tablet (10 mg total) by mouth daily. 30 tablet 11   Multiple Vitamin (MULTIVITAMIN WITH MINERALS) TABS tablet Take 1 tablet by mouth daily.     predniSONE (DELTASONE) 20 MG tablet 3 tabs poqday 1-2, 2 tabs poqday 3-4, 1 tab poqday 5-6 12 tablet 0   predniSONE (DELTASONE) 20 MG tablet Take 2 tablets (40 mg total) by mouth daily with breakfast. 14 tablet 0   tamoxifen (NOLVADEX) 20 MG tablet Take 1 tablet (20 mg total) by mouth at bedtime. 90 tablet 4   triamterene-hydrochlorothiazide (MAXZIDE-25) 37.5-25 MG tablet TAKE 1 TABLET BY MOUTH AT BEDTIME. STOP AMLODIPINE 90 tablet 3   No current facility-administered medications for this visit.    OBJECTIVE: White woman in no acute distress  There were no vitals filed for this visit.     There is no height or weight on file to calculate BMI.    ECOG FS:1 - Symptomatic but completely ambulatory  Physical exam not done, telephone visit  LAB RESULTS:  CMP     Component Value Date/Time   NA 136 07/14/2022 1141   NA 137 07/13/2017 1035   K 3.4 (L) 07/14/2022 1141   K 4.3 07/13/2017 1035   CL 100 07/14/2022 1141   CO2 28 07/14/2022 1141   CO2 27 07/13/2017 1035   GLUCOSE 81 07/14/2022 1141   GLUCOSE 94 07/13/2017 1035   BUN 13 07/14/2022 1141   BUN 15.4 07/13/2017 1035   CREATININE 0.90 07/14/2022 1141   CREATININE 1.02 05/11/2022 0814   CREATININE 0.8 07/13/2017 1035   CALCIUM 9.0 07/14/2022 1141   CALCIUM 9.3 07/13/2017 1035   PROT 7.1 07/14/2022 1141   PROT 7.7 07/13/2017 1035   ALBUMIN 3.6 07/14/2022 1141   ALBUMIN 3.5 07/13/2017 1035   AST 29 07/14/2022 1141   AST 15  07/13/2017 1035   ALT 32 07/14/2022 1141   ALT 14 07/13/2017 1035   ALKPHOS 83 07/14/2022 1141   ALKPHOS 73 07/13/2017 1035   BILITOT 0.3 07/14/2022 1141   BILITOT 0.22 07/13/2017 1035   GFRNONAA >60 07/14/2022 1141   GFRNONAA 64 12/15/2017 1541   GFRAA >60 06/06/2019 1102   GFRAA 50 (L) 06/20/2018 0930   GFRAA 74 12/15/2017 1541    No results found for: "TOTALPROTELP", "ALBUMINELP", "A1GS", "A2GS", "BETS", "BETA2SER", "GAMS", "MSPIKE", "SPEI"  No results found for: "KPAFRELGTCHN", "LAMBDASER", "KAPLAMBRATIO"  Lab Results  Component Value Date   WBC 9.0 07/28/2022   NEUTROABS 6.0 07/28/2022   HGB 10.2 (L) 07/28/2022   HCT 31.3 (L) 07/28/2022   MCV 89.9 07/28/2022   PLT 299 07/28/2022      Chemistry      Component Value Date/Time   NA 136 07/14/2022 1141   NA 137 07/13/2017 1035   K 3.4 (L) 07/14/2022 1141   K 4.3 07/13/2017 1035   CL 100 07/14/2022 1141   CO2 28 07/14/2022 1141   CO2 27 07/13/2017 1035   BUN 13 07/14/2022 1141   BUN 15.4 07/13/2017 1035   CREATININE 0.90 07/14/2022 1141   CREATININE 1.02 05/11/2022 0814   CREATININE 0.8 07/13/2017 1035  Component Value Date/Time   CALCIUM 9.0 07/14/2022 1141   CALCIUM 9.3 07/13/2017 1035   ALKPHOS 83 07/14/2022 1141   ALKPHOS 73 07/13/2017 1035   AST 29 07/14/2022 1141   AST 15 07/13/2017 1035   ALT 32 07/14/2022 1141   ALT 14 07/13/2017 1035   BILITOT 0.3 07/14/2022 1141   BILITOT 0.22 07/13/2017 1035       No results found for: "LABCA2"  No components found for: "BDZHGD924"  No results for input(s): "INR" in the last 168 hours.  Urinalysis No results found for: "COLORURINE", "APPEARANCEUR", "LABSPEC", "PHURINE", "GLUCOSEU", "HGBUR", "BILIRUBINUR", "KETONESUR", "PROTEINUR", "UROBILINOGEN", "NITRITE", "LEUKOCYTESUR"   STUDIES: No results found.   ELIGIBLE FOR AVAILABLE RESEARCH PROTOCOL:   ASSESSMENT: 66 y.o. [place] woman, with bilateral breast cancer   (1) status post right  lumpectomy and sentinel lymph node sampling 01/27/2016 for lobular carcinoma in situ, pleomorphic variant, with multiple foci of invasive lobular carcinoma, all less than a millimeter, but with negative margins and both sentinel lymph nodes negative, invasive disease being estrogen receptor positive, HER-2 not tested (pT1 [mic] pN0}   (2) adjuvant radiation 03/09/2016 through 04/19/2016  The patient initially received a dose of 50 Gy in 25 fractions to the breast using whole-breast tangent fields. This was delivered using a 3-D conformal technique. The patient then received a boost to the seroma. This delivered an additional 10 Gy in 5 fractions using an en face electron field due to the depth of the seroma. The total dose was 60 Gy.   (3) anastrozole started June 2017, discontinued July 2018   (4) status post left breast lower inner quadrant biopsy 12/29/2016 for a clinical T1c N0  invasive lobular breast cancer, grade 2, estrogen receptor positive, progesterone receptor negative HER-2 nonamplified, with a signals ratio 1.35 and the number per cell 2.70.  (5) intermediate Oncotype score of 22 predicts a 10 year risk of recurrence outside the breast of 14% if the patient's only systemic therapy is tamoxifen for 5 years.  (a) the patient opted against chemotherapy Lovena Le Rx results confirm choice)  (6) status post bilateral mastectomies and left sentinel lymph node sampling 03/16/2017 showing  (a) on the right, no evidence of carcinoma  (b) on the left, a pT2 pN0, stage IIA invasive lobular carcinoma, grade 2, with negative margins.  (7) had immediate expander placement at the time of bilateral mastectomies  (a) definitive implant placement with bilateral capsulotomies June 15, 2017, with benign pathology  (b) implants removed February 2019 because of chest discomfort  (c) status post right latissimus flap reconstruction and left capsulotomy 01/30/2018 with benign pathology  (d) decided to  have all implants removed with no further reconstruction.  (Per Dr. Dalbert Mayotte note 03/06/2019).  (8) started tamoxifen 04/30/2017-- plan for total 10 years of antiestrogens given lobular histology   PLAN:  Ms. Yoneko is here for telephone follow up. She says since her last visit, back pain is much better. She is doing PT. She is here to discuss about anemia noted on recent labs. We have noticed that the anemia has improved slightly.  She does not have any evidence of iron deficiency however ferritin was reported high.  We have discussed common causes of elevated ferritin including acute inflammation, iron overload disorders etc.  She has no family history of hemochromatosis nor any European ancestry.  Hence have recommended that we repeat ferritin and CBC, iron panel in a month to see if this will improve since the back pain has started to  improve.  She is very much agreeable to this plan.  I sent a scheduling message to schedule her lab in 4 weeks and a telephone visit in 5 weeks.  Total time spent 15 minutes including history, review of records, counseling and coordination of care  I connected with  Krystal Brooks on 07/29/22 by a telephone application and verified that I am speaking with the correct person using two identifiers.   I discussed the limitations of evaluation and management by telemedicine. The patient expressed understanding and agreed to proceed.   *Total Encounter Time as defined by the Centers for Medicare and Medicaid Services includes, in addition to the face-to-face time of a patient visit (documented in the note above) non-face-to-face time: obtaining and reviewing outside history, ordering and reviewing medications, tests or procedures, care coordination (communications with other health care professionals or caregivers) and documentation in the medical record.

## 2022-08-02 DIAGNOSIS — M545 Low back pain, unspecified: Secondary | ICD-10-CM | POA: Diagnosis not present

## 2022-08-02 DIAGNOSIS — M5416 Radiculopathy, lumbar region: Secondary | ICD-10-CM | POA: Diagnosis not present

## 2022-08-09 DIAGNOSIS — M5416 Radiculopathy, lumbar region: Secondary | ICD-10-CM | POA: Diagnosis not present

## 2022-08-17 DIAGNOSIS — M5416 Radiculopathy, lumbar region: Secondary | ICD-10-CM | POA: Diagnosis not present

## 2022-08-19 DIAGNOSIS — M5416 Radiculopathy, lumbar region: Secondary | ICD-10-CM | POA: Diagnosis not present

## 2022-08-20 ENCOUNTER — Other Ambulatory Visit: Payer: Self-pay | Admitting: Family Medicine

## 2022-08-20 DIAGNOSIS — I10 Essential (primary) hypertension: Secondary | ICD-10-CM

## 2022-08-20 NOTE — Telephone Encounter (Signed)
PT NEED CPE APPT W/PCP FOR FUTURE REFILLS  

## 2022-08-25 ENCOUNTER — Inpatient Hospital Stay: Payer: Medicare HMO | Attending: Hematology and Oncology

## 2022-08-25 DIAGNOSIS — M4807 Spinal stenosis, lumbosacral region: Secondary | ICD-10-CM | POA: Diagnosis not present

## 2022-08-25 DIAGNOSIS — M419 Scoliosis, unspecified: Secondary | ICD-10-CM | POA: Diagnosis not present

## 2022-08-25 DIAGNOSIS — M5136 Other intervertebral disc degeneration, lumbar region: Secondary | ICD-10-CM | POA: Diagnosis not present

## 2022-08-25 DIAGNOSIS — M48061 Spinal stenosis, lumbar region without neurogenic claudication: Secondary | ICD-10-CM | POA: Diagnosis not present

## 2022-08-25 DIAGNOSIS — M47816 Spondylosis without myelopathy or radiculopathy, lumbar region: Secondary | ICD-10-CM | POA: Diagnosis not present

## 2022-08-25 DIAGNOSIS — M5137 Other intervertebral disc degeneration, lumbosacral region: Secondary | ICD-10-CM | POA: Diagnosis not present

## 2022-08-25 DIAGNOSIS — R937 Abnormal findings on diagnostic imaging of other parts of musculoskeletal system: Secondary | ICD-10-CM | POA: Diagnosis not present

## 2022-08-25 DIAGNOSIS — M5134 Other intervertebral disc degeneration, thoracic region: Secondary | ICD-10-CM | POA: Diagnosis not present

## 2022-08-25 DIAGNOSIS — M4316 Spondylolisthesis, lumbar region: Secondary | ICD-10-CM | POA: Diagnosis not present

## 2022-08-25 DIAGNOSIS — M5135 Other intervertebral disc degeneration, thoracolumbar region: Secondary | ICD-10-CM | POA: Diagnosis not present

## 2022-08-25 DIAGNOSIS — M47817 Spondylosis without myelopathy or radiculopathy, lumbosacral region: Secondary | ICD-10-CM | POA: Diagnosis not present

## 2022-08-26 ENCOUNTER — Telehealth: Payer: Self-pay | Admitting: Hematology and Oncology

## 2022-08-26 NOTE — Telephone Encounter (Signed)
Patient called to schedule labs before phone visit. Patient notified of new appointment.

## 2022-09-01 ENCOUNTER — Other Ambulatory Visit: Payer: Self-pay

## 2022-09-01 ENCOUNTER — Inpatient Hospital Stay: Payer: Medicare HMO | Admitting: Hematology and Oncology

## 2022-09-01 ENCOUNTER — Inpatient Hospital Stay: Payer: Medicare HMO | Attending: Hematology and Oncology

## 2022-09-01 DIAGNOSIS — C50311 Malignant neoplasm of lower-inner quadrant of right female breast: Secondary | ICD-10-CM | POA: Diagnosis present

## 2022-09-01 DIAGNOSIS — Z79899 Other long term (current) drug therapy: Secondary | ICD-10-CM | POA: Insufficient documentation

## 2022-09-01 DIAGNOSIS — Z170 Estrogen receptor positive status [ER+]: Secondary | ICD-10-CM | POA: Diagnosis not present

## 2022-09-01 DIAGNOSIS — Z923 Personal history of irradiation: Secondary | ICD-10-CM | POA: Diagnosis not present

## 2022-09-01 DIAGNOSIS — Z9013 Acquired absence of bilateral breasts and nipples: Secondary | ICD-10-CM | POA: Insufficient documentation

## 2022-09-01 DIAGNOSIS — Z87891 Personal history of nicotine dependence: Secondary | ICD-10-CM | POA: Diagnosis not present

## 2022-09-01 DIAGNOSIS — Z90710 Acquired absence of both cervix and uterus: Secondary | ICD-10-CM | POA: Diagnosis not present

## 2022-09-01 DIAGNOSIS — Z79811 Long term (current) use of aromatase inhibitors: Secondary | ICD-10-CM | POA: Diagnosis not present

## 2022-09-01 DIAGNOSIS — C50312 Malignant neoplasm of lower-inner quadrant of left female breast: Secondary | ICD-10-CM | POA: Diagnosis present

## 2022-09-01 DIAGNOSIS — D649 Anemia, unspecified: Secondary | ICD-10-CM

## 2022-09-01 DIAGNOSIS — G893 Neoplasm related pain (acute) (chronic): Secondary | ICD-10-CM | POA: Diagnosis not present

## 2022-09-01 DIAGNOSIS — C7951 Secondary malignant neoplasm of bone: Secondary | ICD-10-CM | POA: Diagnosis present

## 2022-09-01 LAB — CBC WITH DIFFERENTIAL/PLATELET
Abs Immature Granulocytes: 0.09 10*3/uL — ABNORMAL HIGH (ref 0.00–0.07)
Basophils Absolute: 0 10*3/uL (ref 0.0–0.1)
Basophils Relative: 0 %
Eosinophils Absolute: 0.2 10*3/uL (ref 0.0–0.5)
Eosinophils Relative: 2 %
HCT: 28.8 % — ABNORMAL LOW (ref 36.0–46.0)
Hemoglobin: 9.5 g/dL — ABNORMAL LOW (ref 12.0–15.0)
Immature Granulocytes: 1 %
Lymphocytes Relative: 16 %
Lymphs Abs: 1.4 10*3/uL (ref 0.7–4.0)
MCH: 28.8 pg (ref 26.0–34.0)
MCHC: 33 g/dL (ref 30.0–36.0)
MCV: 87.3 fL (ref 80.0–100.0)
Monocytes Absolute: 0.6 10*3/uL (ref 0.1–1.0)
Monocytes Relative: 6 %
Neutro Abs: 6.6 10*3/uL (ref 1.7–7.7)
Neutrophils Relative %: 75 %
Platelets: 214 10*3/uL (ref 150–400)
RBC: 3.3 MIL/uL — ABNORMAL LOW (ref 3.87–5.11)
RDW: 14.8 % (ref 11.5–15.5)
WBC: 8.8 10*3/uL (ref 4.0–10.5)
nRBC: 0 % (ref 0.0–0.2)

## 2022-09-01 LAB — IRON AND IRON BINDING CAPACITY (CC-WL,HP ONLY)
Iron: 42 ug/dL (ref 28–170)
Saturation Ratios: 14 % (ref 10.4–31.8)
TIBC: 297 ug/dL (ref 250–450)
UIBC: 255 ug/dL (ref 148–442)

## 2022-09-01 LAB — FERRITIN: Ferritin: 954 ng/mL — ABNORMAL HIGH (ref 11–307)

## 2022-09-02 NOTE — Telephone Encounter (Signed)
Spoke with patient about scheduling appt for future refills; stated she has CA in her back and will see an oncologist tomorrow. After she finds out what her schedule of appointments will be with that office, she will call back to schedule a med refill appt.  Patient also stated her insurance will no longer cover her refills for budesonide-formoterol (SYMBICORT) 80-4.5 MCG/ACT inhaler [[id number]]   They will only cover the generic brand.  Requesting a call back. Please advise at [phone].

## 2022-09-03 ENCOUNTER — Other Ambulatory Visit: Payer: Self-pay

## 2022-09-03 ENCOUNTER — Inpatient Hospital Stay: Payer: Medicare HMO | Admitting: Hematology and Oncology

## 2022-09-03 ENCOUNTER — Inpatient Hospital Stay: Payer: Medicare HMO

## 2022-09-03 ENCOUNTER — Encounter: Payer: Self-pay | Admitting: Hematology and Oncology

## 2022-09-03 VITALS — BP 133/55 | HR 106 | Temp 98.1°F | Resp 18 | Ht 61.0 in | Wt 115.4 lb

## 2022-09-03 DIAGNOSIS — Z79811 Long term (current) use of aromatase inhibitors: Secondary | ICD-10-CM | POA: Diagnosis not present

## 2022-09-03 DIAGNOSIS — C50212 Malignant neoplasm of upper-inner quadrant of left female breast: Secondary | ICD-10-CM

## 2022-09-03 DIAGNOSIS — G893 Neoplasm related pain (acute) (chronic): Secondary | ICD-10-CM | POA: Diagnosis not present

## 2022-09-03 DIAGNOSIS — Z923 Personal history of irradiation: Secondary | ICD-10-CM | POA: Diagnosis not present

## 2022-09-03 DIAGNOSIS — C50312 Malignant neoplasm of lower-inner quadrant of left female breast: Secondary | ICD-10-CM | POA: Diagnosis not present

## 2022-09-03 DIAGNOSIS — Z170 Estrogen receptor positive status [ER+]: Secondary | ICD-10-CM | POA: Diagnosis not present

## 2022-09-03 DIAGNOSIS — Z9013 Acquired absence of bilateral breasts and nipples: Secondary | ICD-10-CM | POA: Diagnosis not present

## 2022-09-03 DIAGNOSIS — Z87891 Personal history of nicotine dependence: Secondary | ICD-10-CM | POA: Diagnosis not present

## 2022-09-03 DIAGNOSIS — Z90710 Acquired absence of both cervix and uterus: Secondary | ICD-10-CM | POA: Diagnosis not present

## 2022-09-03 DIAGNOSIS — C50311 Malignant neoplasm of lower-inner quadrant of right female breast: Secondary | ICD-10-CM | POA: Diagnosis not present

## 2022-09-03 DIAGNOSIS — C7951 Secondary malignant neoplasm of bone: Secondary | ICD-10-CM | POA: Diagnosis not present

## 2022-09-03 DIAGNOSIS — Z79899 Other long term (current) drug therapy: Secondary | ICD-10-CM | POA: Diagnosis not present

## 2022-09-03 MED ORDER — ESCITALOPRAM OXALATE 10 MG PO TABS: 10.0000 mg | ORAL_TABLET | Freq: Every day | ORAL | 0 refills | Status: DC

## 2022-09-03 MED ORDER — ANASTROZOLE 1 MG PO TABS: 1.0000 mg | ORAL_TABLET | Freq: Every day | ORAL | 3 refills | Status: DC

## 2022-09-03 MED ORDER — TRAMADOL HCL 50 MG PO TABS: 50.0000 mg | ORAL_TABLET | Freq: Three times a day (TID) | ORAL | 0 refills | Status: AC | PRN

## 2022-09-03 NOTE — Progress Notes (Signed)
[facility]  [phone] Fax:([phone]) [phone]     ID: DRAKE LANDING DOB: 07-21-1956  MR#: [id number]  UKG#:[id number]  Patient Care Team: Susy Frizzle, MD as PCP - General (Family Medicine) Excell Seltzer, MD (Inactive) as Consulting Physician (General Surgery) Druscilla Brownie, MD as Consulting Physician (Dermatology) Dillingham, [name], DO as Attending Physician (Plastic Surgery) Magrinat, Virgie Dad, MD (Inactive) as Consulting Physician (Oncology) Delice Bison, Charlestine Massed, NP as Nurse Practitioner (Hematology and Oncology) Edythe Clarity, [facility] as Pharmacist (Pharmacist)  CHIEF COMPLAINT: 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 She is here for follow-up with her family.  Since her last visit here apparently her back pain got much worse and she had imaging which showed concern for metastatic disease in the bone.  Today she reports some improvement in the back pain in the past 1 to 2 weeks, not quite sure how that does.  She however reports weight loss of about 10 to 13 pounds in the past 3 to 4 months, loss of appetite.  She is understandably very anxious about the possibility of metastatic breast cancer.  She continues on tamoxifen as prescribed by Dr. Jana Hakim.  Besides the back pain which is essentially in the lower back radiating down the left leg and bilateral hip pain, she denies any other pain.  No change in breathing.  She has noticed some weakness in her upper arms recently.  No change in bowel, urinary habits. Rest of the pertinent 10 point ROS reviewed and negative.    COVID 19 VACCINATION STATUS: [place] x3, last 05/2020; infection 02/2021   BREAST CANCER HISTORY: From Dr Ernestina Penna 01/14/16 note:   "Krystal Brooks 67 y.o. female is here because of Her  newly diagnosed right breast cancer. She is accompanied by her husband and of friend to our multidisciplinary rest clinic today.   This was found by screening mammogram,  she denies any palpable mass, skin change or nipple discharge, or ulcer constitutional symptoms before the screening.  Her prior mammo 6 years ago. She had a right breast cyst removed in 1979. "   Malignant neoplasm of lower-inner quadrant of right breast of female, estrogen receptor positive ([place])    12/29/2015 Mammogram      Diagnostic right mammogram showed a 2.3 x 2.1 x 2.2 cm group of coarse calcification within the medial slightly lower right breast.         01/02/2016 Initial Diagnosis      Breast cancer of lower-inner quadrant of right female breast ([place])         01/02/2016 Initial Biopsy      right breast LIQ core needle biopsy showed LCIS, with a small focus of invasive lobular carcinoma, grade 2.          01/02/2016 Receptors her2      ER 10% positive, moderate staining. PR negative. Insufficient tissue for HER-2 testing.         01/27/2016 Surgery      Right breast lumpectomy and sentinel lymph node biopsy ([place])         01/27/2016 Pathology Results      Right breast lumpectomy showed pleomorphic variant of lobular carcinoma in situ, and scattered microscopic foci of invasive lobular carcinoma (all less than 0.1 cm) in different sections, total 0.6 cm. 0/2 SLN.  01/27/2016 Receptors her2      The surgical sample of the invasive carcinoma was not sufficient for Her2 test and other additional studies.         03/09/2016 - 04/19/2016 Radiation Therapy      Adjuvant breast radiation [name]). Right breast: 50 Gy in 25 fractions. Right breast "boost": 10 Gy in 5 fractions.           04/2016 -  Anti-estrogen oral therapy      Anastrozole 1 mg daily. Planned duration of therapy: 5 years        PAST MEDICAL HISTORY: Past Medical History:  Diagnosis Date   Adie's pupil    LEFT EYE   Allergy    Asthma     Atherosclerosis of aorta (HCC)    bulge above renal arteries on mri 2016   Cancer ([place]) 2017   right breast ca   Cellulitis    chest   Complication of anesthesia    VERY HARD TO WAKE UP     Eczema    Hypertension    Personal history of radiation therapy    PONV (postoperative nausea and vomiting)    Smoker     PAST SURGICAL HISTORY: Past Surgical History:  Procedure Laterality Date   ABDOMINAL HYSTERECTOMY     BREAST BIOPSY Right 01/02/2016   BREAST EXCISIONAL BIOPSY Right 1979   BREAST IMPLANT REMOVAL Bilateral 10/05/2017   Procedure: REMOVALOF BILATERAL BREAST IMPLANTS;  Surgeon: Wallace Going, DO;  Location: [facility];  Service: Plastics;  Laterality: Bilateral;   BREAST LUMPECTOMY Right 01/27/2016   BREAST LUMPECTOMY WITH RADIOACTIVE SEED AND SENTINEL LYMPH NODE BIOPSY Right 01/27/2016   Procedure: BREAST LUMPECTOMY WITH RADIOACTIVE SEED AND SENTINEL LYMPH NODE BIOPSY;  Surgeon: Excell Seltzer, MD;  Location: [facility];  Service: General;  Laterality: Right;   BREAST RECONSTRUCTION WITH PLACEMENT OF TISSUE EXPANDER AND FLEX HD (ACELLULAR HYDRATED DERMIS) Bilateral 03/16/2017   Procedure: BILATERAL IMMEDIATE BREAST RECONSTRUCTION WITH PLACEMENT OF TISSUE EXPANDER AND FLEX HD (ACELLULAR HYDRATED DERMIS);  Surgeon: Wallace Going, DO;  Location: [facility];  Service: Plastics;  Laterality: Bilateral;   BREAST SURGERY  1979   cyst removed rt breast   CATARACT EXTRACTION W/ INTRAOCULAR LENS IMPLANT     RT EYE   CESAREAN SECTION     x2   EXCISION OF BREAST LESION Left 01/30/2018   Procedure: EXCISION OF LEFT BREAST SCAR;  Surgeon: Wallace Going, DO;  Location: [place] [facility];  Service: Plastics;  Laterality: Left;   LATISSIMUS FLAP TO BREAST Right 01/30/2018   Procedure: RIGHT LATISSIMUS MYOCUTANEOUS FLAP FOR RIGHT CHEST WALL RECONSTRUCTION;  Surgeon: Wallace Going, DO;  Location: [facility];  Service: Plastics;  Laterality: Right;    MASTECTOMY W/ SENTINEL NODE BIOPSY Bilateral 03/16/2017   Procedure: BILATERAL TOTAL MASTECTOMY WITH LEFT SENTINEL LYMPH NODE BIOPSY;  Surgeon: Excell Seltzer, MD;  Location: [facility];  Service: General;  Laterality: Bilateral;   REMOVAL OF BILATERAL TISSUE EXPANDERS WITH PLACEMENT OF BILATERAL BREAST IMPLANTS Bilateral 06/15/2017   Procedure: REMOVAL OF BILATERAL TISSUE EXPANDERS WITH PLACEMENT OF BILATERAL BREAST IMPLANTS;  Surgeon: Wallace Going, DO;  Location: [facility];  Service: Plastics;  Laterality: Bilateral;   TOTAL MASTECTOMY Bilateral 03/16/2017   SENTINAL NODE BIOPSY    FAMILY HISTORY Family History  Problem Relation Age of Onset   Hearing loss Mother    Asthma Father    Cancer Father  melanoma, metastases    Diabetes Father    Heart disease Father    Early death Sister    Early death Paternal Grandfather    Cancer Maternal Grandmother 88       colon cancer    Breast cancer Neg Hx    Ovarian cancer Neg Hx   The patient's father died at age 23. He had been diagnosed with melanoma in 1 year. That was not the cause of death. The patient's mother is living at age 68 as of May 2018. The patient has one brother, no sisters. There is no history of breast or ovarian cancer in the family, and no other melanoma history.  GYNECOLOGIC HISTORY:  No LMP recorded. Patient has had a hysterectomy. 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 She frequently keeps her 2 grandchildren. Her husband Clair Gulling is a retired Airline pilot. He now owns a business where they prepare the connection between the front living in/RV portion and a horse trailer: They do the hitch combination. Daughter April lives in [place] where she works as a Statistician. Daughter Ailene Ravel lives  in [place] where she is an Forensic scientist for [employer]. The patient has 4 grandchildren. She is a Tourist information centre manager.    ADVANCED DIRECTIVES: The patient's husband is automatically her healthcare power of attorney   HEALTH MAINTENANCE: Social History   Tobacco Use   Smoking status: Former    Packs/day: 0.50    Years: 40.00    Total pack years: 20.00    Types: Cigarettes   Smokeless tobacco: Never  Vaping Use   Vaping Use: Never used  Substance Use Topics   Alcohol use: Yes    Alcohol/week: 0.0 standard drinks of alcohol    Comment: social   Drug use: No     Colonoscopy:  PAP:  Bone density:05/24/2016 showed a T score of -0.6 normal   Allergies  Allergen Reactions   Penicillins Swelling, Rash and Other (See Comments)    Older sister died from reaction to penicillin Has patient had a PCN reaction causing immediate rash, facial/tongue/throat swelling, SOB or lightheadedness with hypotension: Yes Has patient had a PCN reaction causing severe rash involving mucus membranes or skin necrosis: No Has patient had a PCN reaction that required hospitalization: No Has patient had a PCN reaction occurring within the last 10 years: No If all of the above answers are "NO", then may proceed with Cephalosporin use.    Sulfa Antibiotics Rash   Levaquin [Levofloxacin] Nausea And Vomiting    Current Outpatient Medications  Medication Sig Dispense Refill   albuterol (PROVENTIL) (2.5 MG/3ML) 0.083% nebulizer solution TAKE 3 MLS (2.5 MG TOTAL) BY NEBULIZATION EVERY 6 HOURS AS NEEDED WHEEZING OR SHORTNESS OF BREATH. 300 mL 5   albuterol (VENTOLIN HFA) 108 (90 Base) MCG/ACT inhaler INHALE 1-2 PUFFS EVERY 6 HOURS AS NEEDED FOR WHEEZING. 18 each 5   Ascorbic Acid (VITAMIN C) 1000 MG tablet Take 1,000 mg by mouth 2 (two) times daily.      budesonide-formoterol (SYMBICORT) 80-4.5 MCG/ACT inhaler TAKE 2 PUFFS BY MOUTH TWICE A DAY 10.2 each 3   doxycycline (VIBRA-TABS) 100 MG tablet Take 1  tablet (100 mg total) by mouth 2 (two) times daily. 14 tablet 0   gabapentin (NEURONTIN) 100 MG capsule  Take 1 capsule (100 mg total) by mouth 3 (three) times daily. 90 capsule 3   HYDROcodone-acetaminophen (NORCO) 5-325 MG tablet Take 1 tablet by mouth every 6 (six) hours as needed for moderate pain. 30 tablet 0   KLOR-CON M10 10 MEQ tablet TAKE 1 TABLET BY MOUTH EVERY DAY 30 tablet 1   losartan (COZAAR) 100 MG tablet TAKE 1 TABLET BY MOUTH EVERY DAY 30 tablet 0   magnesium oxide (MAG-OX) 400 (240 Mg) MG tablet Take 1 tablet by mouth daily.     meloxicam (MOBIC) 15 MG tablet Take 1 tablet (15 mg total) by mouth daily. 30 tablet 0   montelukast (SINGULAIR) 10 MG tablet Take 1 tablet (10 mg total) by mouth daily. 30 tablet 11   Multiple Vitamin (MULTIVITAMIN WITH MINERALS) TABS tablet Take 1 tablet by mouth daily.     predniSONE (DELTASONE) 20 MG tablet 3 tabs poqday 1-2, 2 tabs poqday 3-4, 1 tab poqday 5-6 12 tablet 0   predniSONE (DELTASONE) 20 MG tablet Take 2 tablets (40 mg total) by mouth daily with breakfast. 14 tablet 0   tamoxifen (NOLVADEX) 20 MG tablet Take 1 tablet (20 mg total) by mouth at bedtime. 90 tablet 4   triamterene-hydrochlorothiazide (MAXZIDE-25) 37.5-25 MG tablet TAKE 1 TABLET BY MOUTH AT BEDTIME. STOP AMLODIPINE 30 tablet 0   No current facility-administered medications for this visit.    OBJECTIVE: White woman in no acute distress  Vitals:   09/03/22 1349  BP: (!) 133/55  Pulse: (!) 106  Resp: 18  Temp: 98.1 F (36.7 C)  SpO2: 100%      Body mass index is 21.8 kg/m.    ECOG FS:1 - Symptomatic but completely ambulatory  Physical Exam Constitutional:      Appearance: Normal appearance.  Cardiovascular:     Rate and Rhythm: Normal rate and regular rhythm.  Pulmonary:     Effort: Pulmonary effort is normal.     Breath sounds: Normal breath sounds.  Chest:     Comments: No concern for recurrence locally on exam Musculoskeletal:        General: No swelling.      Cervical back: Normal range of motion. No rigidity.  Lymphadenopathy:     Cervical: No cervical adenopathy.  Skin:    Coloration: Skin is not jaundiced.  Neurological:     General: No focal deficit present.     Mental Status: She is alert.     Cranial Nerves: No cranial nerve deficit.     Motor: No weakness.     Gait: Gait normal.  Psychiatric:        Mood and Affect: Mood normal.      LAB RESULTS:  CMP     Component Value Date/Time   NA 136 07/14/2022 1141   NA 137 07/13/2017 1035   K 3.4 (L) 07/14/2022 1141   K 4.3 07/13/2017 1035   CL 100 07/14/2022 1141   CO2 28 07/14/2022 1141   CO2 27 07/13/2017 1035   GLUCOSE 81 07/14/2022 1141   GLUCOSE 94 07/13/2017 1035   BUN 13 07/14/2022 1141   BUN 15.4 07/13/2017 1035   CREATININE 0.90 07/14/2022 1141   CREATININE 1.02 05/11/2022 0814   CREATININE 0.8 07/13/2017 1035   CALCIUM 9.0 07/14/2022 1141   CALCIUM 9.3 07/13/2017 1035   PROT 7.1 07/14/2022 1141   PROT 7.7 07/13/2017 1035   ALBUMIN 3.6 07/14/2022 1141   ALBUMIN 3.5 07/13/2017 1035   AST 29 07/14/2022 1141  AST 15 07/13/2017 1035   ALT 32 07/14/2022 1141   ALT 14 07/13/2017 1035   ALKPHOS 83 07/14/2022 1141   ALKPHOS 73 07/13/2017 1035   BILITOT 0.3 07/14/2022 1141   BILITOT 0.22 07/13/2017 1035   GFRNONAA >60 07/14/2022 1141   GFRNONAA 64 12/15/2017 1541   GFRAA >60 06/06/2019 1102   GFRAA 50 (L) 06/20/2018 0930   GFRAA 74 12/15/2017 1541    No results found for: "TOTALPROTELP", "ALBUMINELP", "A1GS", "A2GS", "BETS", "BETA2SER", "GAMS", "MSPIKE", "SPEI"  No results found for: "KPAFRELGTCHN", "LAMBDASER", "KAPLAMBRATIO"  Lab Results  Component Value Date   WBC 8.8 09/01/2022   NEUTROABS 6.6 09/01/2022   HGB 9.5 (L) 09/01/2022   HCT 28.8 (L) 09/01/2022   MCV 87.3 09/01/2022   PLT 214 09/01/2022      Chemistry      Component Value Date/Time   NA 136 07/14/2022 1141   NA 137 07/13/2017 1035   K 3.4 (L) 07/14/2022 1141   K 4.3 07/13/2017  1035   CL 100 07/14/2022 1141   CO2 28 07/14/2022 1141   CO2 27 07/13/2017 1035   BUN 13 07/14/2022 1141   BUN 15.4 07/13/2017 1035   CREATININE 0.90 07/14/2022 1141   CREATININE 1.02 05/11/2022 0814   CREATININE 0.8 07/13/2017 1035      Component Value Date/Time   CALCIUM 9.0 07/14/2022 1141   CALCIUM 9.3 07/13/2017 1035   ALKPHOS 83 07/14/2022 1141   ALKPHOS 73 07/13/2017 1035   AST 29 07/14/2022 1141   AST 15 07/13/2017 1035   ALT 32 07/14/2022 1141   ALT 14 07/13/2017 1035   BILITOT 0.3 07/14/2022 1141   BILITOT 0.22 07/13/2017 1035       No results found for: "LABCA2"  No components found for: "CHENID782"  No results for input(s): "INR" in the last 168 hours.  Urinalysis No results found for: "COLORURINE", "APPEARANCEUR", "LABSPEC", "PHURINE", "GLUCOSEU", "HGBUR", "BILIRUBINUR", "KETONESUR", "PROTEINUR", "UROBILINOGEN", "NITRITE", "LEUKOCYTESUR"   STUDIES: No results found.   ELIGIBLE FOR AVAILABLE RESEARCH PROTOCOL:   ASSESSMENT: 67 y.o. [place] woman, with bilateral breast cancer   (1) status post right lumpectomy and sentinel lymph node sampling 01/27/2016 for lobular carcinoma in situ, pleomorphic variant, with multiple foci of invasive lobular carcinoma, all less than a millimeter, but with negative margins and both sentinel lymph nodes negative, invasive disease being estrogen receptor positive, HER-2 not tested (pT1 [mic] pN0}   (2) adjuvant radiation 03/09/2016 through 04/19/2016  The patient initially received a dose of 50 Gy in 25 fractions to the breast using whole-breast tangent fields. This was delivered using a 3-D conformal technique. The patient then received a boost to the seroma. This delivered an additional 10 Gy in 5 fractions using an en face electron field due to the depth of the seroma. The total dose was 60 Gy.   (3) anastrozole started June 2017, discontinued July 2018   (4) status post left breast lower inner quadrant biopsy  12/29/2016 for a clinical T1c N0  invasive lobular breast cancer, grade 2, estrogen receptor positive, progesterone receptor negative HER-2 nonamplified, with a signals ratio 1.35 and the number per cell 2.70.  (5) intermediate Oncotype score of 22 predicts a 10 year risk of recurrence outside the breast of 14% if the patient's only systemic therapy is tamoxifen for 5 years.  (a) the patient opted against chemotherapy Lovena Le Rx results confirm choice)  (6) status post bilateral mastectomies and left sentinel lymph node sampling 03/16/2017 showing  (a) on  the right, no evidence of carcinoma  (b) on the left, a pT2 pN0, stage IIA invasive lobular carcinoma, grade 2, with negative margins.  (7) had immediate expander placement at the time of bilateral mastectomies  (a) definitive implant placement with bilateral capsulotomies June 15, 2017, with benign pathology  (b) implants removed February 2019 because of chest discomfort  (c) status post right latissimus flap reconstruction and left capsulotomy 01/30/2018 with benign pathology  (d) decided to have all implants removed with no further reconstruction.  (Per Dr. Dalbert Mayotte note 03/06/2019).  (8) started tamoxifen 04/30/2017-- plan for total 10 years of antiestrogens given lobular histology   PLAN:  #1 Since her last visit here, she had an MRI for worsening back pain which showed concern for diffuse osseous metastatic disease.  We have today reviewed the MRI imaging results which is indeed concerning for metastatic cancer, likely metastatic breast cancer however we will need a biopsy to confirm this.  We have reviewed that invasive lobular carcinoma does have a propensity to metastasize to the bone.  She is understandably tearful because she did mastectomy with the hope not to ever have to face the cancer again however she now has to be prepared for it.  Daughter also requesting medication for anxiety. We will proceed with imaging to know  extent of systemic disease, she understands that this may not necessarily change the stage.  PET/CT ordered.  #2 for cancer related pain, have sent tramadol 50 mg every 8 hours as needed for pain.  Currently she is taking a lot of ibuprofen.  I have asked her to minimize this given risk of gastritis.  #3 for anxiety, daughter asked if she could start Lexapro.  Apparently Lexapro worked very well for the daughter in the past.  We can certainly try this she was told that it may take a couple weeks to start working.  #4  Given progression on tamoxifen, I recommended switching to anastrozole.  This has also been prescribed to the pharmacy of her choice.  We have discussed that metastatic cancer is incurable, we have several treatment options however we cannot cure the cancer.  This is most likely breast cancer with similar prognostics however at the biopsy needs to be done to establish the diagnosis as well as the prognostics.  She is agreeable to all the recommendations.  Total time spent: 45 minutes  *Total Encounter Time as defined by the Centers for Medicare and Medicaid Services includes, in addition to the face-to-face time of a patient visit (documented in the note above) non-face-to-face time: obtaining and reviewing outside history, ordering and reviewing medications, tests or procedures, care coordination (communications with other health care professionals or caregivers) and documentation in the medical record.

## 2022-09-04 LAB — CANCER ANTIGEN 15-3: CA 15-3: 358 U/mL — ABNORMAL HIGH (ref 0.0–25.0)

## 2022-09-04 LAB — CANCER ANTIGEN 27.29: CA 27.29: 546.7 U/mL — ABNORMAL HIGH (ref 0.0–38.6)

## 2022-09-06 ENCOUNTER — Encounter: Payer: Self-pay | Admitting: Hematology and Oncology

## 2022-09-07 ENCOUNTER — Telehealth: Payer: Self-pay

## 2022-09-07 ENCOUNTER — Other Ambulatory Visit: Payer: Self-pay | Admitting: Family Medicine

## 2022-09-07 MED ORDER — FLUTICASONE-SALMETEROL 115-21 MCG/ACT IN AERO: 2.0000 | INHALATION_SPRAY | Freq: Two times a day (BID) | RESPIRATORY_TRACT | 12 refills | Status: DC

## 2022-09-07 NOTE — Telephone Encounter (Signed)
Patient  stated her insurance will no longer cover her refills for budesonide-formoterol (SYMBICORT) 80-4.5 MCG/ACT inhaler [[id number]]    They will only cover the generic brand.  Can a new Rx be sent in? Thank you.

## 2022-09-07 NOTE — Telephone Encounter (Signed)
Called pt per MyChart message to advise her PET scan had been changed to 09/16/22; same instructions apply. Pt is aware we would like to have this moved up sooner and to expect a possible r/s. She was educated on difference between PET and CT bx and now has an understanding that she will only need the CT bx if we are not able to get PET approved by insurance or scheduled sooner. PET is authorized. Will continue to try having it scheduled sooner. Pt aware.

## 2022-09-08 ENCOUNTER — Telehealth: Payer: Self-pay

## 2022-09-08 NOTE — Telephone Encounter (Signed)
Pt's PET r/s for 09/10/22 at 1200 at [facility] mobile PET. She is aware she will need to be NPO after midnight and can have plain water only. She verbalized agreement. She was also scheduled for MD f/u to review results 1/17.

## 2022-09-08 NOTE — Telephone Encounter (Signed)
Requested medication (s) are due for refill today:   Yes  Requested medication (s) are on the active medication list:   Yes  Future visit scheduled:   N/A   Last ordered: This is not covered.   Pharmacy requesting a change  Requested Prescriptions  Pending Prescriptions Disp Refills   WIXELA INHUB 250-50 MCG/ACT AEPB [Pharmacy Med Name: [facility] Ruts 250-50 INHUB] 1 each 0     Pulmonology:  Combination Products Failed - 09/07/2022  6:01 PM      Failed - Valid encounter within last 12 months    Recent Outpatient Visits           1 year ago Hypomagnesemia   [place], Cammie Mcgee, MD   1 year ago History of breast cancer in female   [place], Cammie Mcgee, MD   2 years ago History of breast cancer in female   [place], Warren T, MD   2 years ago Exacerbation of asthma, unspecified asthma severity, unspecified whether persistent   [place], [place], FNP   2 years ago Exacerbation of asthma, unspecified asthma severity, unspecified whether persistent   [place], [place], FNP

## 2022-09-10 ENCOUNTER — Inpatient Hospital Stay (HOSPITAL_BASED_OUTPATIENT_CLINIC_OR_DEPARTMENT_OTHER)
Admission: EM | Admit: 2022-09-10 | Discharge: 2022-09-12 | DRG: 683 | Disposition: A | Discharge status: Home / Self Care | Payer: Medicare HMO | Attending: Internal Medicine | Admitting: Internal Medicine

## 2022-09-10 ENCOUNTER — Ambulatory Visit
Admission: RE | Admit: 2022-09-10 | Discharge: 2022-09-10 | Disposition: A | Discharge status: Home / Self Care | Payer: Medicare HMO | Source: Ambulatory Visit | Attending: Hematology and Oncology | Admitting: Hematology and Oncology

## 2022-09-10 ENCOUNTER — Encounter (HOSPITAL_COMMUNITY): Payer: Self-pay

## 2022-09-10 ENCOUNTER — Other Ambulatory Visit: Payer: Self-pay

## 2022-09-10 ENCOUNTER — Inpatient Hospital Stay (HOSPITAL_COMMUNITY): Payer: Medicare HMO

## 2022-09-10 ENCOUNTER — Telehealth: Payer: Self-pay | Admitting: *Deleted

## 2022-09-10 ENCOUNTER — Encounter (HOSPITAL_BASED_OUTPATIENT_CLINIC_OR_DEPARTMENT_OTHER): Payer: Self-pay | Admitting: Radiology

## 2022-09-10 DIAGNOSIS — Z7951 Long term (current) use of inhaled steroids: Secondary | ICD-10-CM | POA: Diagnosis not present

## 2022-09-10 DIAGNOSIS — E860 Dehydration: Secondary | ICD-10-CM | POA: Diagnosis present

## 2022-09-10 DIAGNOSIS — R112 Nausea with vomiting, unspecified: Secondary | ICD-10-CM | POA: Diagnosis not present

## 2022-09-10 DIAGNOSIS — Z880 Allergy status to penicillin: Secondary | ICD-10-CM | POA: Diagnosis not present

## 2022-09-10 DIAGNOSIS — Z7952 Long term (current) use of systemic steroids: Secondary | ICD-10-CM

## 2022-09-10 DIAGNOSIS — I7143 Infrarenal abdominal aortic aneurysm, without rupture: Secondary | ICD-10-CM | POA: Insufficient documentation

## 2022-09-10 DIAGNOSIS — Z170 Estrogen receptor positive status [ER+]: Secondary | ICD-10-CM | POA: Insufficient documentation

## 2022-09-10 DIAGNOSIS — Z87891 Personal history of nicotine dependence: Secondary | ICD-10-CM | POA: Diagnosis not present

## 2022-09-10 DIAGNOSIS — E861 Hypovolemia: Secondary | ICD-10-CM | POA: Diagnosis not present

## 2022-09-10 DIAGNOSIS — N179 Acute kidney failure, unspecified: Principal | ICD-10-CM | POA: Diagnosis present

## 2022-09-10 DIAGNOSIS — C50212 Malignant neoplasm of upper-inner quadrant of left female breast: Secondary | ICD-10-CM | POA: Insufficient documentation

## 2022-09-10 DIAGNOSIS — I959 Hypotension, unspecified: Secondary | ICD-10-CM | POA: Diagnosis not present

## 2022-09-10 DIAGNOSIS — J439 Emphysema, unspecified: Secondary | ICD-10-CM | POA: Diagnosis not present

## 2022-09-10 DIAGNOSIS — Z90710 Acquired absence of both cervix and uterus: Secondary | ICD-10-CM | POA: Diagnosis not present

## 2022-09-10 DIAGNOSIS — C50311 Malignant neoplasm of lower-inner quadrant of right female breast: Secondary | ICD-10-CM | POA: Diagnosis not present

## 2022-09-10 DIAGNOSIS — C787 Secondary malignant neoplasm of liver and intrahepatic bile duct: Secondary | ICD-10-CM | POA: Diagnosis not present

## 2022-09-10 DIAGNOSIS — Z79899 Other long term (current) drug therapy: Secondary | ICD-10-CM | POA: Diagnosis not present

## 2022-09-10 DIAGNOSIS — Z961 Presence of intraocular lens: Secondary | ICD-10-CM | POA: Diagnosis present

## 2022-09-10 DIAGNOSIS — Z888 Allergy status to other drugs, medicaments and biological substances status: Secondary | ICD-10-CM | POA: Diagnosis not present

## 2022-09-10 DIAGNOSIS — D630 Anemia in neoplastic disease: Secondary | ICD-10-CM | POA: Diagnosis present

## 2022-09-10 DIAGNOSIS — R748 Abnormal levels of other serum enzymes: Secondary | ICD-10-CM

## 2022-09-10 DIAGNOSIS — Z9013 Acquired absence of bilateral breasts and nipples: Secondary | ICD-10-CM | POA: Diagnosis not present

## 2022-09-10 DIAGNOSIS — E871 Hypo-osmolality and hyponatremia: Secondary | ICD-10-CM

## 2022-09-10 DIAGNOSIS — J00 Acute nasopharyngitis [common cold]: Secondary | ICD-10-CM | POA: Diagnosis present

## 2022-09-10 DIAGNOSIS — Z853 Personal history of malignant neoplasm of breast: Secondary | ICD-10-CM | POA: Diagnosis not present

## 2022-09-10 DIAGNOSIS — D649 Anemia, unspecified: Secondary | ICD-10-CM

## 2022-09-10 DIAGNOSIS — I10 Essential (primary) hypertension: Secondary | ICD-10-CM | POA: Diagnosis present

## 2022-09-10 DIAGNOSIS — C7951 Secondary malignant neoplasm of bone: Secondary | ICD-10-CM | POA: Diagnosis not present

## 2022-09-10 DIAGNOSIS — Z91013 Allergy to seafood: Secondary | ICD-10-CM | POA: Diagnosis not present

## 2022-09-10 DIAGNOSIS — Z881 Allergy status to other antibiotic agents status: Secondary | ICD-10-CM

## 2022-09-10 DIAGNOSIS — I700 Atherosclerosis of aorta: Secondary | ICD-10-CM | POA: Diagnosis not present

## 2022-09-10 LAB — HEPATIC FUNCTION PANEL
ALT: 56 U/L — ABNORMAL HIGH (ref 0–44)
AST: 49 U/L — ABNORMAL HIGH (ref 15–41)
Albumin: 3.6 g/dL (ref 3.5–5.0)
Alkaline Phosphatase: 98 U/L (ref 38–126)
Bilirubin, Direct: 0.1 mg/dL (ref 0.0–0.2)
Indirect Bilirubin: 0.2 mg/dL — ABNORMAL LOW (ref 0.3–0.9)
Total Bilirubin: 0.3 mg/dL (ref 0.3–1.2)
Total Protein: 7.2 g/dL (ref 6.5–8.1)

## 2022-09-10 LAB — CBC WITH DIFFERENTIAL/PLATELET
Abs Immature Granulocytes: 0.29 10*3/uL — ABNORMAL HIGH (ref 0.00–0.07)
Basophils Absolute: 0 10*3/uL (ref 0.0–0.1)
Basophils Relative: 0 %
Eosinophils Absolute: 0.1 10*3/uL (ref 0.0–0.5)
Eosinophils Relative: 1 %
HCT: 27.8 % — ABNORMAL LOW (ref 36.0–46.0)
Hemoglobin: 8.9 g/dL — ABNORMAL LOW (ref 12.0–15.0)
Immature Granulocytes: 3 %
Lymphocytes Relative: 12 %
Lymphs Abs: 1.3 10*3/uL (ref 0.7–4.0)
MCH: 28.1 pg (ref 26.0–34.0)
MCHC: 32 g/dL (ref 30.0–36.0)
MCV: 87.7 fL (ref 80.0–100.0)
Monocytes Absolute: 0.7 10*3/uL (ref 0.1–1.0)
Monocytes Relative: 6 %
Neutro Abs: 8.5 10*3/uL — ABNORMAL HIGH (ref 1.7–7.7)
Neutrophils Relative %: 78 %
Platelets: 341 10*3/uL (ref 150–400)
RBC: 3.17 MIL/uL — ABNORMAL LOW (ref 3.87–5.11)
RDW: 14.8 % (ref 11.5–15.5)
WBC: 10.9 10*3/uL — ABNORMAL HIGH (ref 4.0–10.5)
nRBC: 0 % (ref 0.0–0.2)

## 2022-09-10 LAB — BASIC METABOLIC PANEL
Anion gap: 13 (ref 5–15)
BUN: 37 mg/dL — ABNORMAL HIGH (ref 8–23)
CO2: 24 mmol/L (ref 22–32)
Calcium: 8.8 mg/dL — ABNORMAL LOW (ref 8.9–10.3)
Chloride: 93 mmol/L — ABNORMAL LOW (ref 98–111)
Creatinine, Ser: 3.26 mg/dL — ABNORMAL HIGH (ref 0.44–1.00)
GFR, Estimated: 15 mL/min — ABNORMAL LOW (ref >60)
Glucose, Bld: 147 mg/dL — ABNORMAL HIGH (ref 70–99)
Potassium: 4.1 mmol/L (ref 3.5–5.1)
Sodium: 130 mmol/L — ABNORMAL LOW (ref 135–145)

## 2022-09-10 LAB — LIPASE, BLOOD: Lipase: 39 U/L (ref 11–51)

## 2022-09-10 LAB — CREATININE, URINE, RANDOM: Creatinine, Urine: 108 mg/dL

## 2022-09-10 LAB — SODIUM, URINE, RANDOM: Sodium, Ur: 52 mmol/L

## 2022-09-10 LAB — GLUCOSE, CAPILLARY: Glucose-Capillary: 119 mg/dL — ABNORMAL HIGH (ref 70–99)

## 2022-09-10 MED ORDER — ANASTROZOLE 1 MG PO TABS
1.0000 mg | ORAL_TABLET | Freq: Every day | ORAL | Status: DC
  Administered 2022-09-11 – 2022-09-12 (×2): 1 mg via ORAL
  Filled 2022-09-10 (×2): qty 1

## 2022-09-10 MED ORDER — LACTATED RINGERS IV BOLUS
1000.0000 mL | Freq: Once | INTRAVENOUS | Status: AC
  Administered 2022-09-10: 1000 mL via INTRAVENOUS

## 2022-09-10 MED ORDER — ENOXAPARIN SODIUM 30 MG/0.3ML IJ SOSY: 30.0000 mg | PREFILLED_SYRINGE | INTRAMUSCULAR | Status: DC

## 2022-09-10 MED ORDER — MONTELUKAST SODIUM 10 MG PO TABS
10.0000 mg | ORAL_TABLET | Freq: Every day | ORAL | Status: DC
  Administered 2022-09-11 – 2022-09-12 (×2): 10 mg via ORAL
  Filled 2022-09-10 (×2): qty 1

## 2022-09-10 MED ORDER — ONDANSETRON HCL 4 MG/2ML IJ SOLN
4.0000 mg | Freq: Four times a day (QID) | INTRAMUSCULAR | Status: DC | PRN
  Administered 2022-09-11: 4 mg via INTRAVENOUS
  Filled 2022-09-10 (×2): qty 2

## 2022-09-10 MED ORDER — SODIUM CHLORIDE 0.9 % IV SOLN: INTRAVENOUS | Status: DC

## 2022-09-10 MED ORDER — ACETAMINOPHEN 325 MG PO TABS
650.0000 mg | ORAL_TABLET | Freq: Four times a day (QID) | ORAL | Status: DC | PRN
  Administered 2022-09-11: 650 mg via ORAL
  Filled 2022-09-10: qty 2

## 2022-09-10 MED ORDER — ALBUTEROL SULFATE (2.5 MG/3ML) 0.083% IN NEBU: 2.5000 mg | INHALATION_SOLUTION | Freq: Four times a day (QID) | RESPIRATORY_TRACT | Status: DC | PRN

## 2022-09-10 MED ORDER — FLUDEOXYGLUCOSE F - 18 (FDG) INJECTION
5.9900 | Freq: Once | INTRAVENOUS | Status: AC
  Administered 2022-09-10: 5.99 via INTRAVENOUS

## 2022-09-10 MED ORDER — ACETAMINOPHEN 650 MG RE SUPP: 650.0000 mg | Freq: Four times a day (QID) | RECTAL | Status: DC | PRN

## 2022-09-10 NOTE — ED Triage Notes (Signed)
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 Pt has been vomiting for the past 4 days and taking phenergan and has had sever weakness. Pt states she felt as though she was going to pass out last night but didn't.

## 2022-09-10 NOTE — Assessment & Plan Note (Signed)
Likely secondary to hypoperfusion vs. Hepatic metastatic disease Check hepatitis panel  IVF and keep MAP >65 Trend

## 2022-09-10 NOTE — H&P (Incomplete)
History and Physical    Patient: Krystal Brooks NID:[id number] DOB: 07-28-1956 DOA: 09/10/2022 DOS: the patient was seen and examined on 09/10/2022 PCP: Susy Frizzle, MD  Patient coming from:  [facility]  - lives with her husband. Ambulates independently.    Chief Complaint: N/V and weakness   HPI: Krystal Brooks is a 67 y.o. female with medical history significant of HTN, hx of breast cancer s/p bilateral mastectomy who presented to ED after having a few days of N/V and syncope. She started having vomiting on Sunday. She has had multiple episodes of vomiting daily. She has had progressive weakness and was unable to walk by herself. She had a near syncopal event, but was able to catch herself and sit down on the floor. She called her oncologist who recommended she come to the office. She had her PET scan today which is concerning for widespread hypermetabolic hepatic and osseous metastatic disease. She also was recently changed to anastrozole and started on lexapro which both have AE of N/V. She has not eaten much of anything this entire week, able to keep water down. She has been light headed and dizzy, denies any vision changes. NO sick contacts, no diarrhea. No abdominal pain. She has had an occasional headache.   She is on cozaar and maxzide and has been taking these daily. She also takes ibuprofen daily (8-10 daily), but has not taken this past week.    Denies any fever/chills, vision changes, chest pain or palpitations, shortness of breath or cough, abdominal pain, N/V/D, dysuria or leg swelling. She has had decreased urine output and was darker in color, but no difficulty urinating.    Changed from tamoxifen to anastrozole. Also started on lexapro this week.   She does not smoke or drink alcohol.   ER Course:  vitals: afebrile, bp: 107/57, HR: 89, RR: 17, oxygen: 100%RA Pertinent labs: wbc: 10.9, hgb: 8.9, sodium: 130, BuN: 37, creatinine: 3.26, AST: 49, ALT: 56,  In ED: given 1L IVF  bolus, orthostatics done and [facility] asked to admit.    Review of Systems: As mentioned in the history of present illness. All other systems reviewed and are negative. Past Medical History:  Diagnosis Date  . Adie's pupil    LEFT EYE  . Allergy   . Asthma   . Atherosclerosis of aorta (HCC)    bulge above renal arteries on mri 2016  . Cancer [facility]) 2017   right breast ca  . Cellulitis    chest  . Complication of anesthesia    VERY HARD TO WAKE UP    . Eczema   . Hypertension   . Personal history of radiation therapy   . PONV (postoperative nausea and vomiting)   . Smoker    Past Surgical History:  Procedure Laterality Date  . ABDOMINAL HYSTERECTOMY    . BREAST BIOPSY Right 01/02/2016  . BREAST EXCISIONAL BIOPSY Right 1979  . BREAST IMPLANT REMOVAL Bilateral 10/05/2017   Procedure: [id number] BILATERAL BREAST IMPLANTS;  Surgeon: Wallace Going, DO;  Location: [place];  Service: Plastics;  Laterality: Bilateral;  . BREAST LUMPECTOMY Right 01/27/2016  . BREAST LUMPECTOMY WITH RADIOACTIVE SEED AND SENTINEL LYMPH NODE BIOPSY Right 01/27/2016   Procedure: BREAST LUMPECTOMY WITH RADIOACTIVE SEED AND SENTINEL LYMPH NODE BIOPSY;  Surgeon: Excell Seltzer, MD;  Location: [place];  Service: General;  Laterality: Right;  . BREAST RECONSTRUCTION WITH PLACEMENT OF TISSUE EXPANDER AND FLEX HD (ACELLULAR HYDRATED DERMIS) Bilateral 03/16/2017  Procedure: BILATERAL IMMEDIATE BREAST RECONSTRUCTION WITH PLACEMENT OF TISSUE EXPANDER AND FLEX HD (ACELLULAR HYDRATED DERMIS);  Surgeon: Wallace Going, DO;  Location: [place];  Service: Plastics;  Laterality: Bilateral;  . BREAST SURGERY  1979   cyst removed rt breast  . CATARACT EXTRACTION W/ INTRAOCULAR LENS IMPLANT     RT EYE  . CESAREAN SECTION     x2  . EXCISION OF BREAST LESION Left 01/30/2018   Procedure: EXCISION OF LEFT BREAST SCAR;  Surgeon: Wallace Going, DO;  Location: [place];  Service: Plastics;   Laterality: Left;  . LATISSIMUS FLAP TO BREAST Right 01/30/2018   Procedure: RIGHT LATISSIMUS MYOCUTANEOUS FLAP FOR RIGHT CHEST WALL RECONSTRUCTION;  Surgeon: Wallace Going, DO;  Location: [place];  Service: Plastics;  Laterality: Right;  . MASTECTOMY W/ SENTINEL NODE BIOPSY Bilateral 03/16/2017   Procedure: BILATERAL TOTAL MASTECTOMY WITH LEFT SENTINEL LYMPH NODE BIOPSY;  Surgeon: Excell Seltzer, MD;  Location: [place];  Service: General;  Laterality: Bilateral;  . REMOVAL OF BILATERAL TISSUE EXPANDERS WITH PLACEMENT OF BILATERAL BREAST IMPLANTS Bilateral 06/15/2017   Procedure: REMOVAL OF BILATERAL TISSUE EXPANDERS WITH PLACEMENT OF BILATERAL BREAST IMPLANTS;  Surgeon: Wallace Going, DO;  Location: [place];  Service: Plastics;  Laterality: Bilateral;  . TOTAL MASTECTOMY Bilateral 03/16/2017   SENTINAL NODE BIOPSY   Social History:  reports that she has quit smoking. Her smoking use included cigarettes. She has a 20.00 pack-year smoking history. She has never used smokeless tobacco. She reports current alcohol use. She reports that she does not use drugs.  Allergies  Allergen Reactions  . Penicillins Swelling, Rash and Other (See Comments)    Older sister died from reaction to penicillin Has patient had a PCN reaction causing immediate rash, facial/tongue/throat swelling, SOB or lightheadedness with hypotension: Yes Has patient had a PCN reaction causing severe rash involving mucus membranes or skin necrosis: No Has patient had a PCN reaction that required hospitalization: No Has patient had a PCN reaction occurring within the last 10 years: No If all of the above answers are "NO", then may proceed with Cephalosporin use.   . Sulfa Antibiotics Rash  . Gabapentin Other (See Comments)    Pt states sedates her  . Levaquin [Levofloxacin] Nausea And Vomiting    Family History  Problem Relation Age of Onset  . Hearing loss Mother   . Asthma Father   . Cancer  Father        melanoma, metastases   . Diabetes Father   . Heart disease Father   . Early death Sister   . Early death Paternal Grandfather   . Cancer Maternal Grandmother 88       colon cancer   . Breast cancer Neg Hx   . Ovarian cancer Neg Hx     Prior to Admission medications   Medication Sig Start Date End Date Taking? Authorizing Provider  albuterol (PROVENTIL) (2.5 MG/3ML) 0.083% nebulizer solution TAKE 3 MLS (2.5 MG TOTAL) BY NEBULIZATION EVERY 6 HOURS AS NEEDED WHEEZING OR SHORTNESS OF BREATH. 12/28/19   Ishmael Holter A, FNP  albuterol (VENTOLIN HFA) 108 (90 Base) MCG/ACT inhaler INHALE 1-2 PUFFS EVERY 6 HOURS AS NEEDED FOR WHEEZING. 05/28/22   Susy Frizzle, MD  anastrozole (ARIMIDEX) 1 MG tablet Take 1 tablet (1 mg total) by mouth daily. 09/03/22   Benay Pike, MD  Ascorbic Acid (VITAMIN C) 1000 MG tablet Take 1,000 mg by mouth 2 (two) times daily.  [provider]  doxycycline (VIBRA-TABS) 100 MG tablet Take 1 tablet (100 mg total) by mouth 2 (two) times daily. 06/25/22   Susy Frizzle, MD  escitalopram (LEXAPRO) 10 MG tablet Take 1 tablet (10 mg total) by mouth daily. 09/03/22   Benay Pike, MD  fluticasone-salmeterol (ADVAIR HFA) 161-09 MCG/ACT inhaler Inhale 2 puffs into the lungs 2 (two) times daily. 09/07/22   Susy Frizzle, MD  gabapentin (NEURONTIN) 100 MG capsule Take 1 capsule (100 mg total) by mouth 3 (three) times daily. 07/06/22   Susy Frizzle, MD  KLOR-CON M10 10 MEQ tablet TAKE 1 TABLET BY MOUTH EVERY DAY 06/29/21   Susy Frizzle, MD  losartan (COZAAR) 100 MG tablet TAKE 1 TABLET BY MOUTH EVERY DAY 08/20/22   Susy Frizzle, MD  magnesium oxide (MAG-OX) 400 (240 Mg) MG tablet Take 1 tablet by mouth daily. 04/27/21   [provider]  montelukast (SINGULAIR) 10 MG tablet Take 1 tablet (10 mg total) by mouth daily. 03/03/22   Susy Frizzle, MD  Multiple Vitamin (MULTIVITAMIN WITH MINERALS) TABS tablet Take 1 tablet by mouth  daily.    [provider]  predniSONE (DELTASONE) 20 MG tablet 3 tabs poqday 1-2, 2 tabs poqday 3-4, 1 tab poqday 5-6 05/11/22   Susy Frizzle, MD  predniSONE (DELTASONE) 20 MG tablet Take 2 tablets (40 mg total) by mouth daily with breakfast. 06/25/22   Susy Frizzle, MD  tamoxifen (NOLVADEX) 20 MG tablet Take 1 tablet (20 mg total) by mouth at bedtime. 07/15/22   Benay Pike, MD  traMADol (ULTRAM) 50 MG tablet Take 1 tablet (50 mg total) by mouth every 8 (eight) hours as needed. 09/03/22   Benay Pike, MD  triamterene-hydrochlorothiazide (MAXZIDE-25) 37.5-25 MG tablet TAKE 1 TABLET BY MOUTH AT BEDTIME. STOP AMLODIPINE 08/20/22   Susy Frizzle, MD    Physical Exam: Vitals:   09/10/22 1530 09/10/22 1700 09/10/22 1830 09/10/22 1921  BP: (!) 114/55 114/83 108/75 124/69  Pulse: 93 83 87 87  Resp:  (!) '22 20 18  '$ Temp:    99.9 F (37.7 C)  TempSrc:    Oral  SpO2: 97% 99% 96% 97%  Weight:      Height:       General:  Appears calm and comfortable and is in NAD Eyes:  PERRL, EOMI, normal lids, iris ENT:  grossly normal hearing, lips & tongue, mmm; appropriate dentition Neck:  no LAD, masses or thyromegaly; ***no carotid bruits Cardiovascular:  RRR, no m/r/g. No LE edema.  Respiratory:   CTA bilaterally with no wheezes/rales/rhonchi.  Normal respiratory effort. Abdomen:  soft, NT, ND, NABS Back:   normal alignment, no CVAT Skin:  no rash or induration seen on limited exam Musculoskeletal:  grossly normal tone BUE/BLE, good ROM, no bony abnormality ***Lower extremity:  No LE edema.  Limited foot exam with no ulcerations.  2+ distal pulses. Psychiatric:  grossly normal mood and affect, speech fluent and appropriate, AOx3 Neurologic:  CN 2-12 grossly intact, moves all extremities in coordinated fashion, sensation intact   Radiological Exams on Admission: Independently reviewed - see discussion in A/P where applicable  NM PET Image Initial (PI) Skull Base To Thigh  (F-18 FDG)  Result Date: 09/10/2022 CLINICAL DATA:  Initial treatment strategy for metastatic carcinoma. History of lobular breast cancer. EXAM: NUCLEAR MEDICINE PET SKULL BASE TO THIGH TECHNIQUE: 5.99 mCi F-18 FDG was injected intravenously. Full-ring PET imaging was performed from the skull base to  thigh after the radiotracer. CT data was obtained and used for attenuation correction and anatomic localization. Fasting blood glucose: 119 mg/dl COMPARISON:  None Available. FINDINGS: Mediastinal blood pool activity: SUV max 1.3 NECK: No hypermetabolic cervical lymph nodes are identified.Fairly symmetric activity within the lymphoid tissue of Waldeyer's ring is within physiologic limits.No suspicious activity identified within the pharyngeal mucosal space. Incidental CT findings: Bilateral carotid atherosclerosis. CHEST: Focal hypermetabolic activity in the subcarinal space may relate to a lymph node or the midesophagus (SUV max 4.5). No esophageal dilatation. No other hypermetabolic mediastinal, hilar, axillary or internal mammary lymph nodes. No hypermetabolic pulmonary activity or suspicious nodularity. Incidental CT findings: Mild centrilobular emphysema with scattered parenchymal scarring and patchy ground-glass opacities. There is a small hiatal hernia. Diffuse aortic atherosclerosis with lesser involvement of the great vessels and coronary arteries. No esophageal dilatation. ABDOMEN/PELVIS: Widespread hypermetabolic metastatic disease throughout the liver. There is an approximately 3.5 cm lesion in the dome of the right hepatic lobe which has an SUV max of 6.8. There is a large lesion in the left lobe measuring up to 4.1 cm on image 120/2 with an SUV max of 8.5. No hypermetabolic activity identified within the spleen, pancreas or adrenal glands. There is no hypermetabolic nodal activity in the abdomen or pelvis. Incidental CT findings: Aortic and branch vessel atherosclerosis with a small infrarenal abdominal  aortic aneurysm which measures up to 2.8 cm transverse on image 147/2. Previous hysterectomy. SKELETON: Widespread hypermetabolic osseous metastatic disease with multiple lytic and sclerotic lesions. For example, a sternal manubrial lesion has an SUV max of 4.6. Lesion within the T12 vertebral body has an SUV max of 7.5. Mild superior endplate compression deformities at T9 and T12 which may be pathologic. Mild proximal femoral involvement bilaterally without evidence of impending pathologic fracture. Incidental CT findings: No significant epidural tumor identified. There is a convex left lumbar scoliosis with multilevel spondylosis. Evidence of prior bilateral mastectomy without chest wall recurrence. IMPRESSION: 1. Widespread hypermetabolic hepatic and osseous metastatic disease as described. 2. Indeterminate hypermetabolic activity in the subcarinal space which may relate to the mid esophagus or a subcarinal lymph node. 3. No evidence of breast/chest wall recurrence. 4. 2.8 cm infrarenal abdominal aortic aneurysm. Recommend follow-up every 5 years. Reference: J Am Coll Radiol 8299;37:[id number]. 5. Aortic Atherosclerosis (ICD10-I70.0) and Emphysema (ICD10-J43.9). Electronically Signed   By: Richardean Sale M.D.   On: 09/10/2022 15:11    EKG: Independently reviewed.  NSR with rate 88; nonspecific ST changes with no evidence of acute ischemia   Labs on Admission: I have personally reviewed the available labs and imaging studies at the time of the admission.  Pertinent labs:   wbc: 10.9,  hgb: 8.9,  sodium: 130,  BUN: 37,  creatinine: 3.26,  AST: 49,  ALT: 56,  Assessment and Plan: Principal Problem:   Acute renal failure (ARF) (HCC) Active Problems:   Malignant neoplasm of lower-inner quadrant of right breast of female, estrogen receptor positive (HCC)   HTN (hypertension)    Assessment and Plan: No notes have been filed under this hospital service. Service: Hospitalist      Advance Care  Planning:   Code Status: Prior   Consults: oncology in AM, email sent to her oncologist.   DVT Prophylaxis: lovenox   Family Communication: husband at bedside   Severity of Illness: Inpatient status. Expected to stay greater than 2 midnights for IVF.   Author: Orma Flaming, MD 09/10/2022 8:18 PM  For on call review [email or web address].

## 2022-09-10 NOTE — Care Plan (Signed)
Krystal Brooks is a 67 y.o. F with hx HTN, asthma and breast cancer 2017 s/p bilateral mastectomy who developed back pain and weight loss last few months, had MRI suspicious for osseous metastasis.  In last few days, has developed nausea and vomiting and passed out.  After her PET-CT today, she came to the ER.  In the ER, Cr 3.2 from baseline 0.9.  Given fluids and hospitalists asked to evaluate for renal failure.

## 2022-09-10 NOTE — Assessment & Plan Note (Addendum)
Hypotensive with AKI in setting of dehydration  Hold cozaar/maxzide Responded to IVF Continue IVF overnight  Othostatics +, check daily

## 2022-09-10 NOTE — H&P (Signed)
History and Physical    Patient: Krystal Brooks NLG:[id number] DOB: 20-Dec-1955 DOA: 09/10/2022 DOS: the patient was seen and examined on 09/11/2022 PCP: Susy Frizzle, MD  Patient coming from:  [facility]  - lives with her husband. Ambulates independently.    Chief Complaint: N/V and weakness   HPI: Krystal Brooks is a 67 y.o. female with medical history significant of HTN, hx of breast cancer s/p bilateral mastectomy who presented to ED after having a few days of N/V and syncope. She started having vomiting on Sunday. She has had multiple episodes of vomiting daily. She has had progressive weakness and was unable to walk by herself. She had a near syncopal event, but was able to catch herself and sit down on the floor. She called her oncologist who recommended she come to the office. She had her PET scan today which is concerning for widespread hypermetabolic hepatic and osseous metastatic disease. She also was recently changed to anastrozole and started on lexapro which both have AE of N/V. She has not eaten much of anything this entire week, able to keep water down. She has been light headed and dizzy, denies any vision changes. NO sick contacts, no diarrhea. No abdominal pain. She has had an occasional headache.   She is on cozaar and maxzide and has been taking these daily. She also takes ibuprofen daily (8-10 daily), but has not taken this past week.    Denies any fever/chills, vision changes, chest pain or palpitations, shortness of breath or cough, abdominal pain, N/V/D, dysuria or leg swelling. She has had decreased urine output and was darker in color, but no difficulty urinating.    Changed from tamoxifen to anastrozole. Also started on lexapro this week.   She does not smoke or drink alcohol.   ER Course:  vitals: afebrile, bp: 107/57, HR: 89, RR: 17, oxygen: 100%RA Pertinent labs: wbc: 10.9, hgb: 8.9, sodium: 130, BuN: 37, creatinine: 3.26, AST: 49, ALT: 56,  In ED: given 1L IVF  bolus, orthostatics done and TR[facility] asked to admit.    Review of Systems: As mentioned in the history of present illness. All other systems reviewed and are negative. Past Medical History:  Diagnosis Date   Adie's pupil    LEFT EYE   Allergy    Asthma    Atherosclerosis of aorta (HCC)    bulge above renal arteries on mri 2016   Cancer ([place]) 2017   right breast ca   Cellulitis    chest   Complication of anesthesia    VERY HARD TO WAKE UP     Eczema    Hypertension    Personal history of radiation therapy    PONV (postoperative nausea and vomiting)    Smoker    Past Surgical History:  Procedure Laterality Date   ABDOMINAL HYSTERECTOMY     BREAST BIOPSY Right 01/02/2016   BREAST EXCISIONAL BIOPSY Right 1979   BREAST IMPLANT REMOVAL Bilateral 10/05/2017   Procedure: REMOVALOF BILATERAL BREAST IMPLANTS;  Surgeon: Wallace Going, DO;  Location: [place];  Service: Plastics;  Laterality: Bilateral;   BREAST LUMPECTOMY Right 01/27/2016   BREAST LUMPECTOMY WITH RADIOACTIVE SEED AND SENTINEL LYMPH NODE BIOPSY Right 01/27/2016   Procedure: BREAST LUMPECTOMY WITH RADIOACTIVE SEED AND SENTINEL LYMPH NODE BIOPSY;  Surgeon: Excell Seltzer, MD;  Location: [place];  Service: General;  Laterality: Right;   BREAST RECONSTRUCTION WITH PLACEMENT OF TISSUE EXPANDER AND FLEX HD (ACELLULAR HYDRATED DERMIS) Bilateral 03/16/2017  Procedure: BILATERAL IMMEDIATE BREAST RECONSTRUCTION WITH PLACEMENT OF TISSUE EXPANDER AND FLEX HD (ACELLULAR HYDRATED DERMIS);  Surgeon: Wallace Going, DO;  Location: [place];  Service: Plastics;  Laterality: Bilateral;   BREAST SURGERY  1979   cyst removed rt breast   CATARACT EXTRACTION W/ INTRAOCULAR LENS IMPLANT     RT EYE   CESAREAN SECTION     x2   EXCISION OF BREAST LESION Left 01/30/2018   Procedure: EXCISION OF LEFT BREAST SCAR;  Surgeon: Wallace Going, DO;  Location: [place];  Service: Plastics;  Laterality: Left;    LATISSIMUS FLAP TO BREAST Right 01/30/2018   Procedure: RIGHT LATISSIMUS MYOCUTANEOUS FLAP FOR RIGHT CHEST WALL RECONSTRUCTION;  Surgeon: Wallace Going, DO;  Location: [place];  Service: Plastics;  Laterality: Right;   MASTECTOMY W/ SENTINEL NODE BIOPSY Bilateral 03/16/2017   Procedure: BILATERAL TOTAL MASTECTOMY WITH LEFT SENTINEL LYMPH NODE BIOPSY;  Surgeon: Excell Seltzer, MD;  Location: [place];  Service: General;  Laterality: Bilateral;   REMOVAL OF BILATERAL TISSUE EXPANDERS WITH PLACEMENT OF BILATERAL BREAST IMPLANTS Bilateral 06/15/2017   Procedure: REMOVAL OF BILATERAL TISSUE EXPANDERS WITH PLACEMENT OF BILATERAL BREAST IMPLANTS;  Surgeon: Wallace Going, DO;  Location: [place];  Service: Plastics;  Laterality: Bilateral;   TOTAL MASTECTOMY Bilateral 03/16/2017   SENTINAL NODE BIOPSY   Social History:  reports that she has quit smoking. Her smoking use included cigarettes. She has a 20.00 pack-year smoking history. She has never used smokeless tobacco. She reports current alcohol use. She reports that she does not use drugs.  Allergies  Allergen Reactions   Penicillins Swelling, Rash and Other (See Comments)    Older sister died from reaction to penicillin Has patient had a PCN reaction causing immediate rash, facial/tongue/throat swelling, SOB or lightheadedness with hypotension: Yes Has patient had a PCN reaction causing severe rash involving mucus membranes or skin necrosis: No Has patient had a PCN reaction that required hospitalization: No Has patient had a PCN reaction occurring within the last 10 years: No If all of the above answers are "NO", then may proceed with Cephalosporin use.    Sulfa Antibiotics Rash   Gabapentin Other (See Comments)    Pt states sedates her   Levaquin [Levofloxacin] Nausea And Vomiting    Family History  Problem Relation Age of Onset   Hearing loss Mother    Asthma Father    Cancer Father        melanoma,  metastases    Diabetes Father    Heart disease Father    Early death Sister    Early death Paternal Grandfather    Cancer Maternal Grandmother 88       colon cancer    Breast cancer Neg Hx    Ovarian cancer Neg Hx     Prior to Admission medications   Medication Sig Start Date End Date Taking? Authorizing Provider  albuterol (PROVENTIL) (2.5 MG/3ML) 0.083% nebulizer solution TAKE 3 MLS (2.5 MG TOTAL) BY NEBULIZATION EVERY 6 HOURS AS NEEDED WHEEZING OR SHORTNESS OF BREATH. 12/28/19   Ishmael Holter A, FNP  albuterol (VENTOLIN HFA) 108 (90 Base) MCG/ACT inhaler INHALE 1-2 PUFFS EVERY 6 HOURS AS NEEDED FOR WHEEZING. 05/28/22   Susy Frizzle, MD  anastrozole (ARIMIDEX) 1 MG tablet Take 1 tablet (1 mg total) by mouth daily. 09/03/22   Benay Pike, MD  Ascorbic Acid (VITAMIN C) 1000 MG tablet Take 1,000 mg by mouth 2 (two) times daily.  [provider]  doxycycline (VIBRA-TABS) 100 MG tablet Take 1 tablet (100 mg total) by mouth 2 (two) times daily. 06/25/22   Susy Frizzle, MD  escitalopram (LEXAPRO) 10 MG tablet Take 1 tablet (10 mg total) by mouth daily. 09/03/22   Benay Pike, MD  fluticasone-salmeterol (ADVAIR HFA) 169-67 MCG/ACT inhaler Inhale 2 puffs into the lungs 2 (two) times daily. 09/07/22   Susy Frizzle, MD  gabapentin (NEURONTIN) 100 MG capsule Take 1 capsule (100 mg total) by mouth 3 (three) times daily. 07/06/22   Susy Frizzle, MD  KLOR-CON M10 10 MEQ tablet TAKE 1 TABLET BY MOUTH EVERY DAY 06/29/21   Susy Frizzle, MD  losartan (COZAAR) 100 MG tablet TAKE 1 TABLET BY MOUTH EVERY DAY 08/20/22   Susy Frizzle, MD  magnesium oxide (MAG-OX) 400 (240 Mg) MG tablet Take 1 tablet by mouth daily. 04/27/21   [provider]  montelukast (SINGULAIR) 10 MG tablet Take 1 tablet (10 mg total) by mouth daily. 03/03/22   Susy Frizzle, MD  Multiple Vitamin (MULTIVITAMIN WITH MINERALS) TABS tablet Take 1 tablet by mouth daily.    [provider]  predniSONE (DELTASONE) 20 MG tablet 3 tabs poqday 1-2, 2 tabs poqday 3-4, 1 tab poqday 5-6 05/11/22   Susy Frizzle, MD  predniSONE (DELTASONE) 20 MG tablet Take 2 tablets (40 mg total) by mouth daily with breakfast. 06/25/22   Susy Frizzle, MD  tamoxifen (NOLVADEX) 20 MG tablet Take 1 tablet (20 mg total) by mouth at bedtime. 07/15/22   Benay Pike, MD  traMADol (ULTRAM) 50 MG tablet Take 1 tablet (50 mg total) by mouth every 8 (eight) hours as needed. 09/03/22   Benay Pike, MD  triamterene-hydrochlorothiazide (MAXZIDE-25) 37.5-25 MG tablet TAKE 1 TABLET BY MOUTH AT BEDTIME. STOP AMLODIPINE 08/20/22   Susy Frizzle, MD    Physical Exam: Vitals:   09/10/22 1700 09/10/22 1830 09/10/22 1921 09/10/22 2321  BP: 114/83 108/75 124/69 122/62  Pulse: 83 87 87 88  Resp: (!) '22 20 18 18  '$ Temp:   99.9 F (37.7 C) 99.3 F (37.4 C)  TempSrc:   Oral Oral  SpO2: 99% 96% 97% 100%  Weight:      Height:       General:  Appears calm and comfortable and is in NAD Eyes:  PERRL, EOMI, normal lids, iris ENT:  grossly normal hearing, lips & tongue, dry mucous membranes, appropriate dentition Neck:  no LAD, masses or thyromegaly; no carotid bruits Cardiovascular:  RRR, no m/r/g. No LE edema.  Respiratory:   CTA bilaterally with no wheezes/rales/rhonchi.  Normal respiratory effort. Abdomen:  soft, NT, ND, NABS Back:   normal alignment, no CVAT Skin:  no rash or induration seen on limited exam. +skin tenting  Musculoskeletal:  grossly normal tone BUE/BLE, good ROM, no bony abnormality Lower extremity:  No LE edema.  Limited foot exam with no ulcerations.  2+ distal pulses. Psychiatric:  grossly normal mood and affect, speech fluent and appropriate, AOx3 Neurologic:  CN 2-12 grossly intact, moves all extremities in coordinated fashion, sensation intact   Radiological Exams on Admission: Independently reviewed - see discussion in A/P where applicable  US RENAL  Result Date:  09/10/2022 CLINICAL DATA:  Acute renal failure EXAM: RENAL / URINARY TRACT ULTRASOUND COMPLETE COMPARISON:  PET CT 09/10/2022 FINDINGS: Right Kidney: Renal measurements: 9.7 x 4.8 x 4.9 cm = volume: 118.4 mL. Echogenicity within normal limits. No mass or hydronephrosis visualized.  Left Kidney: Renal measurements: 9.6 x 4.7 x 4.7 cm = volume: 111.9 mL. Echogenicity within normal limits. No mass or hydronephrosis visualized. Bladder: Appears normal for degree of bladder distention. Other: Multiple hypoechoic liver masses consistent with metastatic disease. IMPRESSION: 1. Normal ultrasound appearance of the kidneys 2. Multiple liver masses consistent with metastatic disease. Electronically Signed   By: Donavan Foil M.D.   On: 09/10/2022 21:14   NM PET Image Initial (PI) Skull Base To Thigh (F-18 FDG)  Result Date: 09/10/2022 CLINICAL DATA:  Initial treatment strategy for metastatic carcinoma. History of lobular breast cancer. EXAM: NUCLEAR MEDICINE PET SKULL BASE TO THIGH TECHNIQUE: 5.99 mCi F-18 FDG was injected intravenously. Full-ring PET imaging was performed from the skull base to thigh after the radiotracer. CT data was obtained and used for attenuation correction and anatomic localization. Fasting blood glucose: 119 mg/dl COMPARISON:  None Available. FINDINGS: Mediastinal blood pool activity: SUV max 1.3 NECK: No hypermetabolic cervical lymph nodes are identified.Fairly symmetric activity within the lymphoid tissue of Waldeyer's ring is within physiologic limits.No suspicious activity identified within the pharyngeal mucosal space. Incidental CT findings: Bilateral carotid atherosclerosis. CHEST: Focal hypermetabolic activity in the subcarinal space may relate to a lymph node or the midesophagus (SUV max 4.5). No esophageal dilatation. No other hypermetabolic mediastinal, hilar, axillary or internal mammary lymph nodes. No hypermetabolic pulmonary activity or suspicious nodularity. Incidental CT findings:  Mild centrilobular emphysema with scattered parenchymal scarring and patchy ground-glass opacities. There is a small hiatal hernia. Diffuse aortic atherosclerosis with lesser involvement of the great vessels and coronary arteries. No esophageal dilatation. ABDOMEN/PELVIS: Widespread hypermetabolic metastatic disease throughout the liver. There is an approximately 3.5 cm lesion in the dome of the right hepatic lobe which has an SUV max of 6.8. There is a large lesion in the left lobe measuring up to 4.1 cm on image 120/2 with an SUV max of 8.5. No hypermetabolic activity identified within the spleen, pancreas or adrenal glands. There is no hypermetabolic nodal activity in the abdomen or pelvis. Incidental CT findings: Aortic and branch vessel atherosclerosis with a small infrarenal abdominal aortic aneurysm which measures up to 2.8 cm transverse on image 147/2. Previous hysterectomy. SKELETON: Widespread hypermetabolic osseous metastatic disease with multiple lytic and sclerotic lesions. For example, a sternal manubrial lesion has an SUV max of 4.6. Lesion within the T12 vertebral body has an SUV max of 7.5. Mild superior endplate compression deformities at T9 and T12 which may be pathologic. Mild proximal femoral involvement bilaterally without evidence of impending pathologic fracture. Incidental CT findings: No significant epidural tumor identified. There is a convex left lumbar scoliosis with multilevel spondylosis. Evidence of prior bilateral mastectomy without chest wall recurrence. IMPRESSION: 1. Widespread hypermetabolic hepatic and osseous metastatic disease as described. 2. Indeterminate hypermetabolic activity in the subcarinal space which may relate to the mid esophagus or a subcarinal lymph node. 3. No evidence of breast/chest wall recurrence. 4. 2.8 cm infrarenal abdominal aortic aneurysm. Recommend follow-up every 5 years. Reference: J Am Coll Radiol 3570;17:[id number]. 5. Aortic Atherosclerosis  (ICD10-I70.0) and Emphysema (ICD10-J43.9). Electronically Signed   By: Richardean Sale M.D.   On: 09/10/2022 15:11    EKG: Independently reviewed.  NSR with rate 88; nonspecific ST changes with no evidence of acute ischemia   Labs on Admission: I have personally reviewed the available labs and imaging studies at the time of the admission.  Pertinent labs:   wbc: 10.9,  hgb: 8.9,  sodium: 130,  BUN: 37,  creatinine: 3.26,  AST: 49,  ALT: 56,  Assessment and Plan: Principal Problem:   Acute renal failure (ARF) (HCC) Active Problems:   Intractable nausea and vomiting   Hyponatremia   Elevated liver enzymes   HTN (hypertension)   Malignant neoplasm of lower-inner quadrant of right breast of female, estrogen receptor positive (HCC)   Normocytic anemia    Assessment and Plan: * Acute renal failure (ARF) ([place]) 68 year old female presenting with 1 week of intractable nausea/vomiting and poor PO intake with significant weakness and fatigue found to have an AKI with creatinine up to 3.26 on admission -admit to med-surg -likely pre renal in setting of intractable N/V and hypotension with insult of nephrotoxic drugs  -goal MAP >65 -UA pending -urine studies -renal US -hold nephrotoxic drugs -continue gentle IVF -strict I/O -trend    Intractable nausea and vomiting ? If due to AE from new medication. Recently started on lexapro and changed from tamoxifen to anastrozole.  AKI likely contributing  Holding lexapro  Continue zofran PRN   Hyponatremia Secondary to hypovolemia from intractable N/V and dehydration  Received 1L IVF in ED, continue at 75cc/hour Trend TSH, urine sodium and urine osmolality also pending   Elevated liver enzymes Likely secondary to hypoperfusion vs. Hepatic metastatic disease Check hepatitis panel  IVF and keep MAP >65 Trend   HTN (hypertension) Hypotensive with AKI in setting of dehydration  Hold cozaar/maxzide Responded to IVF Continue IVF  overnight  Othostatics +, check daily   Malignant neoplasm of lower-inner quadrant of right breast of female, estrogen receptor positive ([place]) PET scan today with concerning findings of widespread hypermetabolic hepatic and osseous metastatic disease.  She does not want to discuss results with me, only with her oncologist  Consider brain imaging  Have sent email to her oncologist and can formally consult tomorrow   Normocytic anemia Trending downward, but stable.  Recent iron studies normal except for elevated ferritin Likely secondary to cancer Followed by oncology     Advance Care Planning:   Code Status: Full Code   Consults: oncology in AM, email sent to her oncologist.   DVT Prophylaxis: lovenox   Family Communication: husband at bedside   Severity of Illness: Inpatient status. Expected to stay greater than 2 midnights for IVF.   Author: Orma Flaming, MD 09/11/2022 12:09 AM  For on call review [email or web address].

## 2022-09-10 NOTE — ED Provider Notes (Signed)
[facility] DEPT Provider Note   CSN: [id number] Arrival date & time: 09/10/22  1511     History {Add pertinent medical, surgical, social history, OB history to HPI:1} Chief Complaint  Patient presents with   Emesis   Weakness    Krystal Brooks is a 67 y.o. female who presents emergency department chief complaint of weakness and vomiting.  Patient states that she has a past medical history of breast cancer.  She was treated 5 years ago and was in remission.  She had been lately having some low back pain and was sent in for an MRI by her orthopedist who then called her and told her she appeared to have metastatic lesions.  She saw her oncologist who placed her on Lexapro and anastrozole.  Since starting this medication she has had profound nausea and is pretty severe vomiting.  She was taking Zofran twice daily without any improvement in her symptoms.  She has been having more and more weakness and lethargy over the past 2 weeks.  She went in to get a PET scan this morning and called her oncologist who then told her to come to the ER for further evaluation.  She feels like she is going to pass out when she stands but states as long as she is lying flat she feels okay.  She had her last dose of Phenergan at 8 AM this morning and currently does not have any nausea.  She denies fevers, chills, cough, or peripheral edema.   Emesis Weakness Associated symptoms: vomiting        Home Medications Prior to Admission medications   Medication Sig Start Date End Date Taking? Authorizing Provider  albuterol (PROVENTIL) (2.5 MG/3ML) 0.083% nebulizer solution TAKE 3 MLS (2.5 MG TOTAL) BY NEBULIZATION EVERY 6 HOURS AS NEEDED WHEEZING OR SHORTNESS OF BREATH. 12/28/19   Ishmael Holter A, FNP  albuterol (VENTOLIN HFA) 108 (90 Base) MCG/ACT inhaler INHALE 1-2 PUFFS EVERY 6 HOURS AS NEEDED FOR WHEEZING. 05/28/22   Susy Frizzle, MD  anastrozole (ARIMIDEX) 1 MG tablet Take 1 tablet  (1 mg total) by mouth daily. 09/03/22   Benay Pike, MD  Ascorbic Acid (VITAMIN C) 1000 MG tablet Take 1,000 mg by mouth 2 (two) times daily.     [provider]  doxycycline (VIBRA-TABS) 100 MG tablet Take 1 tablet (100 mg total) by mouth 2 (two) times daily. 06/25/22   Susy Frizzle, MD  escitalopram (LEXAPRO) 10 MG tablet Take 1 tablet (10 mg total) by mouth daily. 09/03/22   Benay Pike, MD  fluticasone-salmeterol (ADVAIR HFA) 542-70 MCG/ACT inhaler Inhale 2 puffs into the lungs 2 (two) times daily. 09/07/22   Susy Frizzle, MD  gabapentin (NEURONTIN) 100 MG capsule Take 1 capsule (100 mg total) by mouth 3 (three) times daily. 07/06/22   Susy Frizzle, MD  KLOR-CON M10 10 MEQ tablet TAKE 1 TABLET BY MOUTH EVERY DAY 06/29/21   Susy Frizzle, MD  losartan (COZAAR) 100 MG tablet TAKE 1 TABLET BY MOUTH EVERY DAY 08/20/22   Susy Frizzle, MD  magnesium oxide (MAG-OX) 400 (240 Mg) MG tablet Take 1 tablet by mouth daily. 04/27/21   [provider]  montelukast (SINGULAIR) 10 MG tablet Take 1 tablet (10 mg total) by mouth daily. 03/03/22   Susy Frizzle, MD  Multiple Vitamin (MULTIVITAMIN WITH MINERALS) TABS tablet Take 1 tablet by mouth daily.    [provider]  predniSONE (DELTASONE) 20 MG tablet 3  tabs poqday 1-2, 2 tabs poqday 3-4, 1 tab poqday 5-6 05/11/22   Susy Frizzle, MD  predniSONE (DELTASONE) 20 MG tablet Take 2 tablets (40 mg total) by mouth daily with breakfast. 06/25/22   Susy Frizzle, MD  tamoxifen (NOLVADEX) 20 MG tablet Take 1 tablet (20 mg total) by mouth at bedtime. 07/15/22   Benay Pike, MD  traMADol (ULTRAM) 50 MG tablet Take 1 tablet (50 mg total) by mouth every 8 (eight) hours as needed. 09/03/22   Benay Pike, MD  triamterene-hydrochlorothiazide (MAXZIDE-25) 37.5-25 MG tablet TAKE 1 TABLET BY MOUTH AT BEDTIME. STOP AMLODIPINE 08/20/22   Susy Frizzle, MD      Allergies    Penicillins, Sulfa antibiotics,  Gabapentin, and Levaquin [levofloxacin]    Review of Systems   Review of Systems  Gastrointestinal:  Positive for vomiting.  Neurological:  Positive for weakness.    Physical Exam Updated Vital Signs BP (!) 114/55   Pulse 93   Temp 98.1 F (36.7 C)   Resp 17   Ht '5\' 1"'$  (1.549 m)   Wt 50.8 kg   SpO2 97%   BMI 21.16 kg/m  Physical Exam Vitals and nursing note reviewed.  Constitutional:      General: She is not in acute distress.    Appearance: She is well-developed. She is not diaphoretic.  HENT:     Head: Normocephalic and atraumatic.     Right Ear: External ear normal.     Left Ear: External ear normal.     Nose: Nose normal.     Mouth/Throat:     Mouth: Mucous membranes are moist.  Eyes:     General: No scleral icterus.    Conjunctiva/sclera: Conjunctivae normal.  Cardiovascular:     Rate and Rhythm: Normal rate and regular rhythm.     Heart sounds: Normal heart sounds. No murmur heard.    No friction rub. No gallop.  Pulmonary:     Effort: Pulmonary effort is normal. No respiratory distress.     Breath sounds: Normal breath sounds.  Abdominal:     General: Bowel sounds are normal. There is no distension.     Palpations: Abdomen is soft. There is no mass.     Tenderness: There is no abdominal tenderness. There is no guarding.  Musculoskeletal:     Cervical back: Normal range of motion.  Skin:    General: Skin is warm and dry.  Neurological:     Mental Status: She is alert and oriented to person, place, and time.  Psychiatric:        Behavior: Behavior normal.     ED Results / Procedures / Treatments   Labs (all labs ordered are listed, but only abnormal results are displayed) Labs Reviewed  CBC WITH DIFFERENTIAL/PLATELET - Abnormal; Notable for the following components:      Result Value   WBC 10.9 (*)    RBC 3.17 (*)    Hemoglobin 8.9 (*)    HCT 27.8 (*)    Neutro Abs 8.5 (*)    Abs Immature Granulocytes 0.29 (*)    All other components within  normal limits  BASIC METABOLIC PANEL - Abnormal; Notable for the following components:   Sodium 130 (*)    Chloride 93 (*)    Glucose, Bld 147 (*)    BUN 37 (*)    Creatinine, Ser 3.26 (*)    Calcium 8.8 (*)    GFR, Estimated 15 (*)    All other  components within normal limits  HEPATIC FUNCTION PANEL - Abnormal; Notable for the following components:   AST 49 (*)    ALT 56 (*)    Indirect Bilirubin 0.2 (*)    All other components within normal limits  LIPASE, BLOOD    EKG None  Radiology NM PET Image Initial (PI) Skull Base To Thigh (F-18 FDG)  Result Date: 09/10/2022 CLINICAL DATA:  Initial treatment strategy for metastatic carcinoma. History of lobular breast cancer. EXAM: NUCLEAR MEDICINE PET SKULL BASE TO THIGH TECHNIQUE: 5.99 mCi F-18 FDG was injected intravenously. Full-ring PET imaging was performed from the skull base to thigh after the radiotracer. CT data was obtained and used for attenuation correction and anatomic localization. Fasting blood glucose: 119 mg/dl COMPARISON:  None Available. FINDINGS: Mediastinal blood pool activity: SUV max 1.3 NECK: No hypermetabolic cervical lymph nodes are identified.Fairly symmetric activity within the lymphoid tissue of Waldeyer's ring is within physiologic limits.No suspicious activity identified within the pharyngeal mucosal space. Incidental CT findings: Bilateral carotid atherosclerosis. CHEST: Focal hypermetabolic activity in the subcarinal space may relate to a lymph node or the midesophagus (SUV max 4.5). No esophageal dilatation. No other hypermetabolic mediastinal, hilar, axillary or internal mammary lymph nodes. No hypermetabolic pulmonary activity or suspicious nodularity. Incidental CT findings: Mild centrilobular emphysema with scattered parenchymal scarring and patchy ground-glass opacities. There is a small hiatal hernia. Diffuse aortic atherosclerosis with lesser involvement of the great vessels and coronary arteries. No  esophageal dilatation. ABDOMEN/PELVIS: Widespread hypermetabolic metastatic disease throughout the liver. There is an approximately 3.5 cm lesion in the dome of the right hepatic lobe which has an SUV max of 6.8. There is a large lesion in the left lobe measuring up to 4.1 cm on image 120/2 with an SUV max of 8.5. No hypermetabolic activity identified within the spleen, pancreas or adrenal glands. There is no hypermetabolic nodal activity in the abdomen or pelvis. Incidental CT findings: Aortic and branch vessel atherosclerosis with a small infrarenal abdominal aortic aneurysm which measures up to 2.8 cm transverse on image 147/2. Previous hysterectomy. SKELETON: Widespread hypermetabolic osseous metastatic disease with multiple lytic and sclerotic lesions. For example, a sternal manubrial lesion has an SUV max of 4.6. Lesion within the T12 vertebral body has an SUV max of 7.5. Mild superior endplate compression deformities at T9 and T12 which may be pathologic. Mild proximal femoral involvement bilaterally without evidence of impending pathologic fracture. Incidental CT findings: No significant epidural tumor identified. There is a convex left lumbar scoliosis with multilevel spondylosis. Evidence of prior bilateral mastectomy without chest wall recurrence. IMPRESSION: 1. Widespread hypermetabolic hepatic and osseous metastatic disease as described. 2. Indeterminate hypermetabolic activity in the subcarinal space which may relate to the mid esophagus or a subcarinal lymph node. 3. No evidence of breast/chest wall recurrence. 4. 2.8 cm infrarenal abdominal aortic aneurysm. Recommend follow-up every 5 years. Reference: J Am Coll Radiol 7858;85:027-741. 5. Aortic Atherosclerosis (ICD10-I70.0) and Emphysema (ICD10-J43.9). Electronically Signed   By: Richardean Sale M.D.   On: 09/10/2022 15:11    Procedures Procedures  {Document cardiac monitor, telemetry assessment procedure when appropriate:1}  Medications  Ordered in ED Medications  lactated ringers bolus 1,000 mL (has no administration in time range)    ED Course/ Medical Decision Making/ A&P Clinical Course as of 09/10/22 1645  Fri Sep 10, 2022  1644 CBC with Differential(!) [AH]  1644 Hemoglobin(!): 8.9 Hemoglobin appears to be at baseline [AH]  1644 Lipase, blood Lipase within normal limits [AH]  1645  Hepatic function panel(!) Hepatic functions shows a little bit of a bump in her AST and ALT but otherwise insignificant, indirect bilirubin is just barely elevated [AH]  6606 Basic metabolic panel(!) Patient BMP shows marked kidney injury.  She she will need admission for AKI.  I have ordered fluids. [AH]    Clinical Course User Index [AH] Margarita Mail, PA-C   {   Click here for ABCD2, HEART and other calculatorsREFRESH Note before signing :1}                          Medical Decision Making 66 year old female who presents to the emergency department with nausea, vomiting and weakness. The emergent differential diagnosis for vomiting includes, but is not limited to ACS/MI, DKA, Ischemic bowel, Meningitis, Sepsis, Acute gastric dilation, Adrenal insufficiency, Appendicitis,  Bowel obstruction/ileus, Carbon monoxide poisoning, Cholecystitis, Electrolyte abnormalities, Elevated ICP, Gastric outlet obstruction, Pancreatitis, Ruptured viscus, Biliary colic, Cannabinoid hyperemesis syndrome, Gastritis, Gastroenteritis, Gastroparesis,  Narcotic withdrawal, Peptic ulcer disease, and UTI  I suspect her nausea is secondary to her new medications.  Her weakness appears to be secondary to acute kidney injury with fairly significant bump in her creatinine up to 3.26 today.  Ordered fluid resuscitation.  Patient will need admission.  She is currently pain-free and without nausea at this time.  Amount and/or Complexity of Data Reviewed Labs: ordered. ECG/medicine tests: ordered.   ***  {Document critical care time when  appropriate:1} {Document review of labs and clinical decision tools ie heart score, Chads2Vasc2 etc:1}  {Document your independent review of radiology images, and any outside records:1} {Document your discussion with family members, caretakers, and with consultants:1} {Document social determinants of health affecting pt's care:1} {Document your decision making why or why not admission, treatments were needed:1} Final Clinical Impression(s) / ED Diagnoses Final diagnoses:  None    Rx / DC Orders ED Discharge Orders     None

## 2022-09-10 NOTE — Hospital Course (Signed)
Krystal Brooks is a 67 y.o. F with hx HTN, asthma and breast cancer 2017 s/p bilateral mastectomy who developed back pain and weight loss last few months, had MRI suspicious for osseous metastasis.  In last few days, has developed nausea and vomiting and passed out.  After her PET-CT today, she came to the ER.  In the ER, Cr 3.2 from baseline 0.9.  Given fluids and hospitalists asked to evaluate for renal failure.

## 2022-09-10 NOTE — Assessment & Plan Note (Signed)
Secondary to hypovolemia from intractable N/V and dehydration  Received 1L IVF in ED, continue at 75cc/hour Trend TSH, urine sodium and urine osmolality also pending

## 2022-09-10 NOTE — Telephone Encounter (Signed)
Patient called to advise that she has had nausea and vomiting over the last few days without relief from zofran.  Phenergan, however seems to help.  States she feels very dehydrated and passed out last evening.  Is on her way to [facility] for PET scan. Advised that she report to the ER following her scan for evaluation.  Verbalized understanding.

## 2022-09-11 DIAGNOSIS — N179 Acute kidney failure, unspecified: Secondary | ICD-10-CM | POA: Diagnosis not present

## 2022-09-11 DIAGNOSIS — D649 Anemia, unspecified: Secondary | ICD-10-CM

## 2022-09-11 LAB — CBC
HCT: 26.4 % — ABNORMAL LOW (ref 36.0–46.0)
Hemoglobin: 8.4 g/dL — ABNORMAL LOW (ref 12.0–15.0)
MCH: 28.5 pg (ref 26.0–34.0)
MCHC: 31.8 g/dL (ref 30.0–36.0)
MCV: 89.5 fL (ref 80.0–100.0)
Platelets: 285 10*3/uL (ref 150–400)
RBC: 2.95 MIL/uL — ABNORMAL LOW (ref 3.87–5.11)
RDW: 14.9 % (ref 11.5–15.5)
WBC: 8.9 10*3/uL (ref 4.0–10.5)
nRBC: 0 % (ref 0.0–0.2)

## 2022-09-11 LAB — TSH: TSH: 0.996 u[IU]/mL (ref 0.350–4.500)

## 2022-09-11 LAB — URINALYSIS, ROUTINE W REFLEX MICROSCOPIC
Bilirubin Urine: NEGATIVE
Glucose, UA: NEGATIVE mg/dL
Hgb urine dipstick: NEGATIVE
Ketones, ur: NEGATIVE mg/dL
Leukocytes,Ua: NEGATIVE
Nitrite: NEGATIVE
Protein, ur: NEGATIVE mg/dL
Specific Gravity, Urine: 1.01 (ref 1.005–1.030)
pH: 5 (ref 5.0–8.0)

## 2022-09-11 LAB — COMPREHENSIVE METABOLIC PANEL
ALT: 53 U/L — ABNORMAL HIGH (ref 0–44)
AST: 45 U/L — ABNORMAL HIGH (ref 15–41)
Albumin: 2.6 g/dL — ABNORMAL LOW (ref 3.5–5.0)
Alkaline Phosphatase: 85 U/L (ref 38–126)
Anion gap: 9 (ref 5–15)
BUN: 32 mg/dL — ABNORMAL HIGH (ref 8–23)
CO2: 24 mmol/L (ref 22–32)
Calcium: 8.3 mg/dL — ABNORMAL LOW (ref 8.9–10.3)
Chloride: 98 mmol/L (ref 98–111)
Creatinine, Ser: 2.6 mg/dL — ABNORMAL HIGH (ref 0.44–1.00)
GFR, Estimated: 20 mL/min — ABNORMAL LOW (ref >60)
Glucose, Bld: 99 mg/dL (ref 70–99)
Potassium: 4.1 mmol/L (ref 3.5–5.1)
Sodium: 131 mmol/L — ABNORMAL LOW (ref 135–145)
Total Bilirubin: 0.5 mg/dL (ref 0.3–1.2)
Total Protein: 6 g/dL — ABNORMAL LOW (ref 6.5–8.1)

## 2022-09-11 LAB — OSMOLALITY, URINE: Osmolality, Ur: 339 mOsm/kg (ref 300–900)

## 2022-09-11 LAB — HIV ANTIBODY (ROUTINE TESTING W REFLEX): HIV Screen 4th Generation wRfx: NONREACTIVE

## 2022-09-11 LAB — OSMOLALITY: Osmolality: 285 mOsm/kg (ref 275–295)

## 2022-09-11 MED ORDER — ESCITALOPRAM OXALATE 10 MG PO TABS
10.0000 mg | ORAL_TABLET | Freq: Every day | ORAL | Status: DC
  Administered 2022-09-11 – 2022-09-12 (×2): 10 mg via ORAL
  Filled 2022-09-11 (×2): qty 1

## 2022-09-11 MED ORDER — PROCHLORPERAZINE EDISYLATE 10 MG/2ML IJ SOLN
10.0000 mg | Freq: Four times a day (QID) | INTRAMUSCULAR | Status: DC | PRN
  Administered 2022-09-11 – 2022-09-12 (×4): 10 mg via INTRAVENOUS
  Filled 2022-09-11 (×4): qty 2

## 2022-09-11 MED ORDER — TRAMADOL HCL 50 MG PO TABS: 50.0000 mg | ORAL_TABLET | Freq: Three times a day (TID) | ORAL | Status: DC | PRN

## 2022-09-11 MED ORDER — MOMETASONE FURO-FORMOTEROL FUM 200-5 MCG/ACT IN AERO
2.0000 | INHALATION_SPRAY | Freq: Two times a day (BID) | RESPIRATORY_TRACT | Status: DC
  Administered 2022-09-12: 2 via RESPIRATORY_TRACT
  Filled 2022-09-11: qty 8.8

## 2022-09-11 NOTE — Assessment & Plan Note (Signed)
?  If due to AE from new medication. Recently started on lexapro and changed from tamoxifen to anastrozole.  AKI likely contributing  Holding lexapro  Continue zofran PRN

## 2022-09-11 NOTE — Progress Notes (Signed)
[facility] Progress Note Patient: Krystal Brooks IHK:[id number] DOB: [birth year] DOA: 09/10/2022  DOS: the patient was seen and examined on 09/11/2022  Brief hospital course: Krystal Brooks is a 67 y.o. F with hx HTN, asthma and breast cancer 2017 s/p bilateral mastectomy who developed back pain and weight loss last few months, had MRI suspicious for osseous metastasis.  In last few days, has developed nausea and vomiting and passed out.  After her PET-CT today, she came to the ER.  In the ER, Cr 3.2 from baseline 0.9.  Given fluids and hospitalists asked to evaluate for renal failure. Assessment and Plan: Acute renal failure. Likely combination of poor p.o. intake with nausea and vomiting in the setting of common cold and ongoing use of losartan and Maxide as well as hypotension. CT scan is negative for any significant obstruction. Improving with IV fluid. Will continue with IV hydration and monitor response.  Intractable nausea and vomiting. Currently resolved. Etiology not clear. Monitor.  Hyponatremia. Clinically nonsignificant. Continue with fluids.  Breast cancer with metastasis with progression. ER positive. PET scan shows concerning widespread hepatic and osseous metastatic disease. Outpatient follow-up with oncology recommended.  HTN. Blood pressure actually soft. Will check orthostatic vitals. Continue with IV fluids. Hold antihypertensive regimen.  Anemia. H&H stable.  Monitor.  Common cold. Appears to be improving. Monitor.   Subjective: No nausea no vomiting no fever no chills.  No chest pain.  No abdominal pain.  Had some headache.  Resolved.  Physical Exam: General: in Mild distress, No Rash Cardiovascular: S1 and S2 Present, No Murmur Respiratory: Good respiratory effort, Bilateral Air entry present. No Crackles, No wheezes Abdomen: Bowel Sound present, No tenderness Extremities: No edema Neuro: Alert and oriented x3, no new focal deficit  Data  Reviewed: I have Reviewed nursing notes, Vitals, and Lab results. Since last encounter, pertinent lab results CBC and BMP   . I have ordered test including CBC and BMP  .   Disposition: Status is: Inpatient Remains inpatient appropriate because: Need for IV fluids.  Place and maintain sequential compression device Start: 09/11/22 0812   Family Communication: Husband at bedside Level of care: Med-Surg  Vitals:   09/10/22 1921 09/10/22 2321 09/11/22 0325 09/11/22 1338  BP: 124/69 122/62 99/81 (!) 106/58  Pulse: 87 88 82 74  Resp: '18 18 18 18  '$ Temp: 99.9 F (37.7 C) 99.3 F (37.4 C) 98.7 F (37.1 C) 97.9 F (36.6 C)  TempSrc: Oral Oral Oral Oral  SpO2: 97% 100% 97% 100%  Weight:      Height:         Author: Berle Mull, MD 09/11/2022 7:31 PM  Please look on [email or web address] to find out who is on call.

## 2022-09-11 NOTE — Plan of Care (Signed)

## 2022-09-11 NOTE — Assessment & Plan Note (Signed)
67 year old female presenting with 1 week of intractable nausea/vomiting and poor PO intake with significant weakness and fatigue found to have an AKI with creatinine up to 3.26 on admission -admit to med-surg -likely pre renal in setting of intractable N/V and hypotension with insult of nephrotoxic drugs  -goal MAP >65 -UA pending -urine studies -renal US -hold nephrotoxic drugs -continue gentle IVF -strict I/O -trend

## 2022-09-11 NOTE — Assessment & Plan Note (Addendum)
PET scan today with concerning findings of widespread hypermetabolic hepatic and osseous metastatic disease.  She does not want to discuss results with me, only with her oncologist  Consider brain imaging  Have sent email to her oncologist and can formally consult tomorrow

## 2022-09-11 NOTE — Assessment & Plan Note (Signed)
Trending downward, but stable.  Recent iron studies normal except for elevated ferritin Likely secondary to cancer Followed by oncology

## 2022-09-12 DIAGNOSIS — N179 Acute kidney failure, unspecified: Secondary | ICD-10-CM | POA: Diagnosis not present

## 2022-09-12 LAB — COMPREHENSIVE METABOLIC PANEL
ALT: 38 U/L (ref 0–44)
AST: 32 U/L (ref 15–41)
Albumin: 2.4 g/dL — ABNORMAL LOW (ref 3.5–5.0)
Alkaline Phosphatase: 70 U/L (ref 38–126)
Anion gap: 6 (ref 5–15)
BUN: 26 mg/dL — ABNORMAL HIGH (ref 8–23)
CO2: 21 mmol/L — ABNORMAL LOW (ref 22–32)
Calcium: 7.4 mg/dL — ABNORMAL LOW (ref 8.9–10.3)
Chloride: 102 mmol/L (ref 98–111)
Creatinine, Ser: 2.09 mg/dL — ABNORMAL HIGH (ref 0.44–1.00)
GFR, Estimated: 25 mL/min — ABNORMAL LOW (ref >60)
Glucose, Bld: 95 mg/dL (ref 70–99)
Potassium: 3.6 mmol/L (ref 3.5–5.1)
Sodium: 129 mmol/L — ABNORMAL LOW (ref 135–145)
Total Bilirubin: 0.4 mg/dL (ref 0.3–1.2)
Total Protein: 5.6 g/dL — ABNORMAL LOW (ref 6.5–8.1)

## 2022-09-12 LAB — MAGNESIUM: Magnesium: 1.2 mg/dL — ABNORMAL LOW (ref 1.7–2.4)

## 2022-09-12 MED ORDER — ADULT MULTIVITAMIN W/MINERALS CH
1.0000 | ORAL_TABLET | Freq: Every day | ORAL | Status: DC
  Administered 2022-09-12: 1 via ORAL
  Filled 2022-09-12: qty 1

## 2022-09-12 MED ORDER — MAGNESIUM SULFATE 4 GM/100ML IV SOLN
4.0000 g | Freq: Once | INTRAVENOUS | Status: AC
  Administered 2022-09-12: 4 g via INTRAVENOUS
  Filled 2022-09-12: qty 100

## 2022-09-12 MED ORDER — PROMETHAZINE HCL 12.5 MG PO TABS: 12.5000 mg | ORAL_TABLET | Freq: Four times a day (QID) | ORAL | 0 refills | Status: DC | PRN

## 2022-09-12 MED ORDER — ENSURE ENLIVE PO LIQD
237.0000 mL | Freq: Two times a day (BID) | ORAL | Status: DC
  Administered 2022-09-12: 237 mL via ORAL

## 2022-09-12 MED ORDER — ENSURE ENLIVE PO LIQD: 237.0000 mL | Freq: Two times a day (BID) | ORAL | 0 refills | Status: AC

## 2022-09-12 MED ORDER — POTASSIUM CHLORIDE CRYS ER 10 MEQ PO TBCR: 10.0000 meq | EXTENDED_RELEASE_TABLET | Freq: Every day | ORAL | 1 refills | Status: AC

## 2022-09-12 MED ORDER — ADULT MULTIVITAMIN W/MINERALS CH: 1.0000 | ORAL_TABLET | Freq: Every day | ORAL | 0 refills | Status: AC

## 2022-09-13 ENCOUNTER — Encounter: Payer: Self-pay | Admitting: *Deleted

## 2022-09-13 ENCOUNTER — Other Ambulatory Visit: Payer: Self-pay | Admitting: Hematology and Oncology

## 2022-09-13 ENCOUNTER — Telehealth: Payer: Self-pay | Admitting: *Deleted

## 2022-09-13 DIAGNOSIS — Z170 Estrogen receptor positive status [ER+]: Secondary | ICD-10-CM

## 2022-09-13 NOTE — Progress Notes (Signed)
Called Central Scheduling for CT Liver Biopsy order. Redirected to [facility] in radiology who stated they would put in for review and call Pt to schedule ASAP. Gave call back number with any questions.

## 2022-09-13 NOTE — Patient Outreach (Signed)
  Care Coordination TOC Note Transition Care Management Follow-up Telephone Call Date of discharge and from where: [facility] 09/12/22 How have you been since you were released from the hospital? "Still having some occasional nausea. Has something to take. Feeling kind of weak."  Any questions or concerns? No  Items Reviewed: Did the pt receive and understand the discharge instructions provided? Yes  Medications obtained and verified? Yes  Other? Yes . Discussed management of nausea and prevent dehydration & improvement of AKI Any new allergies since your discharge? No  Dietary orders reviewed? Yes Do you have support at home? Yes   Home Care and Equipment/Supplies: Were home health services ordered? no If so, what is the name of the agency?   Has the agency set up a time to come to the patient's home? not applicable Were any new equipment or medical supplies ordered?  No What is the name of the medical supply agency?  Were you able to get the supplies/equipment? not applicable Do you have any questions related to the use of the equipment or supplies? No  Functional Questionnaire: (I = Independent and D = Dependent) ADLs: I  Bathing/Dressing- I  Meal Prep- I  Eating- I  Maintaining continence- I  Transferring/Ambulation- I  Managing Meds- I  Follow up appointments reviewed:  PCP Hospital f/u appt confirmed?  Not indicated [facility] f/u appt confirmed? Yes  Scheduled to see Dr Chryl Heck on 09/15/22 at 1:15. CT guided liver biopsy pending scheduling.  Are transportation arrangements needed? No  If their condition worsens, is the pt aware to call PCP or go to the Emergency Dept.? Yes Was the patient provided with contact information for the PCP's office or ED? Yes Was to pt encouraged to call back with questions or concerns? Yes  SDOH assessments and interventions completed:   Yes SDOH Interventions Today    Flowsheet Row Most Recent Value  SDOH Interventions    Housing Interventions Intervention Not Indicated  Transportation Interventions Intervention Not Indicated  Financial Strain Interventions Intervention Not Indicated       Care Coordination Interventions:  No Care Coordination interventions needed at this time.   Encounter Outcome:  Pt. Visit Completed    Chong Sicilian, BSN, RN-BC RN Care Coordinator [place] Direct Dial: [phone] Main #: [phone]

## 2022-09-13 NOTE — Discharge Summary (Signed)
Physician Discharge Summary   Patient: Krystal Brooks MRN: [id number] DOB: [birth year]  Admit date:     09/10/2022  Discharge date: 09/12/2022  Discharge Physician: Berle Mull  PCP: Susy Frizzle, MD  Recommendations at discharge: Follow-up with PCP in 1 week with BMP.   Follow-up Information     Susy Frizzle, MD. Schedule an appointment as soon as possible for a visit in 1 week(s).   Specialty: Family Medicine Contact information: [address] [place] [address] [phone]                Discharge Diagnoses: Principal Problem:   Acute renal failure (ARF) ([place]) Active Problems:   Intractable nausea and vomiting   Hyponatremia   Elevated liver enzymes   HTN (hypertension)   Malignant neoplasm of lower-inner quadrant of right breast of female, estrogen receptor positive ([place])   Normocytic anemia  Hospital Course: Krystal Brooks is a 67 y.o. F with hx HTN, asthma and breast cancer 2017 s/p bilateral mastectomy who developed back pain and weight loss last few months, had MRI suspicious for osseous metastasis. In last few days, has developed nausea and vomiting and passed out.  After her PET-CT today, she came to the ER. In the ER, Cr 3.2 from baseline 0.9.  Treated with IV fluids.  Assessment and Plan  Acute renal failure. Likely combination of poor p.o. intake with nausea and vomiting in the setting of common cold and ongoing use of losartan and Maxide as well as hypotension. CT scan is negative for any significant obstruction. Improving with IV fluid.   Intractable nausea and vomiting. Currently resolved. Etiology not clear.  Likely related to malignancy.   Hyponatremia. Clinically nonsignificant. Continue with fluids.   Breast cancer with metastasis with progression. ER positive. PET scan shows concerning widespread hepatic and osseous metastatic disease. Outpatient follow-up with oncology recommended.   HTN. Blood pressure actually  soft. Negative orthostatic vitals. Hold antihypertensive regimen.  At present I do not feel the patient requires any antihypertensive medication for now. Even when We will need to resume the medication I do not think that the diuretics are indicated.   Anemia. H&H stable.  Monitor.   Common cold. Appears to be improving. Monitor.   Consultants:  none  Procedures performed:  none  DISCHARGE MEDICATION: Allergies as of 09/12/2022       Reactions   Gabapentin Other (See Comments)   Pt states sedates her   Penicillins Anaphylaxis, Swelling, Rash, Other (See Comments)   Older sister died from reaction to penicillin Has patient had a PCN reaction causing immediate rash, facial/tongue/throat swelling, SOB or lightheadedness with hypotension: Yes Has patient had a PCN reaction causing severe rash involving mucus membranes or skin necrosis: No Has patient had a PCN reaction that required hospitalization: No Has patient had a PCN reaction occurring within the last 10 years: No If all of the above answers are "NO", then may proceed with Cephalosporin use.   Sulfa Antibiotics Rash   Levaquin [levofloxacin] Nausea And Vomiting        Medication List     STOP taking these medications    HYDROcodone-acetaminophen 5-325 MG tablet Commonly known as: NORCO/VICODIN   losartan 100 MG tablet Commonly known as: COZAAR   ondansetron 4 MG disintegrating tablet Commonly known as: ZOFRAN-ODT   triamterene-hydrochlorothiazide 37.5-25 MG tablet Commonly known as: MAXZIDE-25       TAKE these medications    albuterol (2.5 MG/3ML) 0.083% nebulizer solution  Commonly known as: PROVENTIL TAKE 3 MLS (2.5 MG TOTAL) BY NEBULIZATION EVERY 6 HOURS AS NEEDED WHEEZING OR SHORTNESS OF BREATH. What changed: See the new instructions.   albuterol 108 (90 Base) MCG/ACT inhaler Commonly known as: VENTOLIN HFA INHALE 1-2 PUFFS EVERY 6 HOURS AS NEEDED FOR WHEEZING. What changed:  how much to  take how to take this when to take this reasons to take this   anastrozole 1 MG tablet Commonly known as: ARIMIDEX Take 1 tablet (1 mg total) by mouth daily. What changed: when to take this   budesonide-formoterol 80-4.5 MCG/ACT inhaler Commonly known as: SYMBICORT Inhale 2 puffs into the lungs 2 (two) times daily as needed ("for flares").   escitalopram 10 MG tablet Commonly known as: LEXAPRO Take 1 tablet (10 mg total) by mouth daily. What changed: when to take this   feeding supplement Liqd Take 237 mLs by mouth 2 (two) times daily between meals.   fluticasone-salmeterol 115-21 MCG/ACT inhaler Commonly known as: Advair HFA Inhale 2 puffs into the lungs 2 (two) times daily.   montelukast 10 MG tablet Commonly known as: SINGULAIR Take 1 tablet (10 mg total) by mouth daily. What changed: when to take this   multivitamin with minerals Tabs tablet Take 1 tablet by mouth daily.   potassium chloride 10 MEQ tablet Commonly known as: Klor-Con M10 Take 1 tablet (10 mEq total) by mouth daily. What changed: how much to take   promethazine 12.5 MG tablet Commonly known as: PHENERGAN Take 1 tablet (12.5 mg total) by mouth every 6 (six) hours as needed for nausea or vomiting.   traMADol 50 MG tablet Commonly known as: ULTRAM Take 1 tablet (50 mg total) by mouth every 8 (eight) hours as needed. What changed: reasons to take this       Disposition: Home Diet recommendation: Regular diet  Discharge Exam: Vitals:   09/11/22 2035 09/12/22 0815 09/12/22 0817 09/12/22 1325  BP: (!) 114/54   115/61  Pulse: 88   77  Resp: 16   (!) 21  Temp: 99.3 F (37.4 C)   99.7 F (37.6 C)  TempSrc: Oral   Oral  SpO2: 97% 96% 96% 99%  Weight:      Height:       General: Appear in no distress; no visible Abnormal Neck Mass Or lumps, Conjunctiva normal Cardiovascular: S1 and S2 Present, no Murmur, Respiratory: good respiratory effort, Bilateral Air entry present and CTA, no Crackles,  no wheezes Abdomen: Bowel Sound present, Non tender  Extremities: no Pedal edema Neurology: alert and oriented to time, place, and person  Filed Weights   09/10/22 1520  Weight: 50.8 kg   Condition at discharge: stable  The results of significant diagnostics from this hospitalization (including imaging, microbiology, ancillary and laboratory) are listed below for reference.   Imaging Studies: US RENAL  Result Date: 09/10/2022 CLINICAL DATA:  Acute renal failure EXAM: RENAL / URINARY TRACT ULTRASOUND COMPLETE COMPARISON:  PET CT 09/10/2022 FINDINGS: Right Kidney: Renal measurements: 9.7 x 4.8 x 4.9 cm = volume: 118.4 mL. Echogenicity within normal limits. No mass or hydronephrosis visualized. Left Kidney: Renal measurements: 9.6 x 4.7 x 4.7 cm = volume: 111.9 mL. Echogenicity within normal limits. No mass or hydronephrosis visualized. Bladder: Appears normal for degree of bladder distention. Other: Multiple hypoechoic liver masses consistent with metastatic disease. IMPRESSION: 1. Normal ultrasound appearance of the kidneys 2. Multiple liver masses consistent with metastatic disease. Electronically Signed   By: Madie Reno.D.  On: 09/10/2022 21:14   NM PET Image Initial (PI) Skull Base To Thigh (F-18 FDG)  Result Date: 09/10/2022 CLINICAL DATA:  Initial treatment strategy for metastatic carcinoma. History of lobular breast cancer. EXAM: NUCLEAR MEDICINE PET SKULL BASE TO THIGH TECHNIQUE: 5.99 mCi F-18 FDG was injected intravenously. Full-ring PET imaging was performed from the skull base to thigh after the radiotracer. CT data was obtained and used for attenuation correction and anatomic localization. Fasting blood glucose: 119 mg/dl COMPARISON:  None Available. FINDINGS: Mediastinal blood pool activity: SUV max 1.3 NECK: No hypermetabolic cervical lymph nodes are identified.Fairly symmetric activity within the lymphoid tissue of Waldeyer's ring is within physiologic limits.No suspicious  activity identified within the pharyngeal mucosal space. Incidental CT findings: Bilateral carotid atherosclerosis. CHEST: Focal hypermetabolic activity in the subcarinal space may relate to a lymph node or the midesophagus (SUV max 4.5). No esophageal dilatation. No other hypermetabolic mediastinal, hilar, axillary or internal mammary lymph nodes. No hypermetabolic pulmonary activity or suspicious nodularity. Incidental CT findings: Mild centrilobular emphysema with scattered parenchymal scarring and patchy ground-glass opacities. There is a small hiatal hernia. Diffuse aortic atherosclerosis with lesser involvement of the great vessels and coronary arteries. No esophageal dilatation. ABDOMEN/PELVIS: Widespread hypermetabolic metastatic disease throughout the liver. There is an approximately 3.5 cm lesion in the dome of the right hepatic lobe which has an SUV max of 6.8. There is a large lesion in the left lobe measuring up to 4.1 cm on image 120/2 with an SUV max of 8.5. No hypermetabolic activity identified within the spleen, pancreas or adrenal glands. There is no hypermetabolic nodal activity in the abdomen or pelvis. Incidental CT findings: Aortic and branch vessel atherosclerosis with a small infrarenal abdominal aortic aneurysm which measures up to 2.8 cm transverse on image 147/2. Previous hysterectomy. SKELETON: Widespread hypermetabolic osseous metastatic disease with multiple lytic and sclerotic lesions. For example, a sternal manubrial lesion has an SUV max of 4.6. Lesion within the T12 vertebral body has an SUV max of 7.5. Mild superior endplate compression deformities at T9 and T12 which may be pathologic. Mild proximal femoral involvement bilaterally without evidence of impending pathologic fracture. Incidental CT findings: No significant epidural tumor identified. There is a convex left lumbar scoliosis with multilevel spondylosis. Evidence of prior bilateral mastectomy without chest wall  recurrence. IMPRESSION: 1. Widespread hypermetabolic hepatic and osseous metastatic disease as described. 2. Indeterminate hypermetabolic activity in the subcarinal space which may relate to the mid esophagus or a subcarinal lymph node. 3. No evidence of breast/chest wall recurrence. 4. 2.8 cm infrarenal abdominal aortic aneurysm. Recommend follow-up every 5 years. Reference: J Am Coll Radiol 6629;47:654-650. 5. Aortic Atherosclerosis (ICD10-I70.0) and Emphysema (ICD10-J43.9). Electronically Signed   By: Richardean Sale M.D.   On: 09/10/2022 15:11    Microbiology: Results for orders placed or performed during the hospital encounter of 03/01/21  Resp Panel by RT-PCR (Flu A&B, Covid) Nasopharyngeal Swab     Status: Abnormal   Collection Time: 03/01/21 10:51 AM   Specimen: Nasopharyngeal Swab; Nasopharyngeal(NP) swabs in vial transport medium  Result Value Ref Range Status   SARS Coronavirus 2 by RT PCR POSITIVE (A) NEGATIVE Final    Comment: RESULT CALLED TO, READ BACK BY AND VERIFIED WITH: [place] [id number] 03/01/21 JM (NOTE) SARS-CoV-2 target nucleic acids are DETECTED.  The SARS-CoV-2 RNA is generally detectable in upper respiratory specimens during the acute phase of infection. Positive results are indicative of the presence of the identified virus, but do not rule out  bacterial infection or co-infection with other pathogens not detected by the test. Clinical correlation with patient history and other diagnostic information is necessary to determine patient infection status. The expected result is Negative.  Fact Sheet for Patients: EntrepreneurPulse.com.au  Fact Sheet for Healthcare Providers: IncredibleEmployment.be  This test is not yet approved or cleared by the [place] FDA and  has been authorized for detection and/or diagnosis of SARS-CoV-2 by FDA under an Emergency Use Authorization (EUA).  This EUA will remain in effect (meaning this test  can be used) f or the duration of  the COVID-19 declaration under Section 564(b)(1) of the Act, 21 U.S.C. section 360bbb-3(b)(1), unless the authorization is terminated or revoked sooner.     Influenza A by PCR NEGATIVE NEGATIVE Final   Influenza B by PCR NEGATIVE NEGATIVE Final    Comment: (NOTE) The Xpert Xpress SARS-CoV-2/FLU/RSV plus assay is intended as an aid in the diagnosis of influenza from Nasopharyngeal swab specimens and should not be used as a sole basis for treatment. Nasal washings and aspirates are unacceptable for Xpert Xpress SARS-CoV-2/FLU/RSV testing.  Fact Sheet for Patients: EntrepreneurPulse.com.au  Fact Sheet for Healthcare Providers: IncredibleEmployment.be  This test is not yet approved or cleared by the [place] FDA and has been authorized for detection and/or diagnosis of SARS-CoV-2 by FDA under an Emergency Use Authorization (EUA). This EUA will remain in effect (meaning this test can be used) for the duration of the COVID-19 declaration under Section 564(b)(1) of the Act, 21 U.S.C. section 360bbb-3(b)(1), unless the authorization is terminated or revoked.  Performed at [facility], [address], [place], [place] [address]    Labs: CBC: Recent Labs  Lab 09/10/22 1550 09/11/22 0837  WBC 10.9* 8.9  NEUTROABS 8.5*  --   HGB 8.9* 8.4*  HCT 27.8* 26.4*  MCV 87.7 89.5  PLT 341 371   Basic Metabolic Panel: Recent Labs  Lab 09/10/22 1550 09/11/22 0837 09/12/22 0751  NA 130* 131* 129*  K 4.1 4.1 3.6  CL 93* 98 102  CO2 24 24 21*  GLUCOSE 147* 99 95  BUN 37* 32* 26*  CREATININE 3.26* 2.60* 2.09*  CALCIUM 8.8* 8.3* 7.4*  MG  --   --  1.2*   Liver Function Tests: Recent Labs  Lab 09/10/22 1550 09/11/22 0837 09/12/22 0751  AST 49* 45* 32  ALT 56* 53* 38  ALKPHOS 98 85 70  BILITOT 0.3 0.5 0.4  PROT 7.2 6.0* 5.6*  ALBUMIN 3.6 2.6* 2.4*   CBG: Recent Labs  Lab  09/10/22 1243  GLUCAP 119*    Discharge time spent: greater than 30 minutes.  Signed: Berle Mull, MD [facility] 09/12/2022

## 2022-09-13 NOTE — Progress Notes (Signed)
I called patient with PET results. Recommended liver biopsy She has phenergan for nausea which is working well. I will see her this Wednesday and discuss everything in detail.  Krystal Brooks

## 2022-09-14 NOTE — Progress Notes (Signed)
Krystal Edouard, MD  Donita Brooks D OK for liver lesion core biopsy w/ sedation.  GY

## 2022-09-15 ENCOUNTER — Other Ambulatory Visit: Payer: Self-pay

## 2022-09-15 ENCOUNTER — Encounter: Payer: Self-pay | Admitting: Hematology and Oncology

## 2022-09-15 ENCOUNTER — Other Ambulatory Visit: Payer: Self-pay | Admitting: Hematology and Oncology

## 2022-09-15 ENCOUNTER — Inpatient Hospital Stay: Payer: Medicare HMO

## 2022-09-15 ENCOUNTER — Telehealth: Payer: Self-pay

## 2022-09-15 ENCOUNTER — Inpatient Hospital Stay: Payer: Medicare HMO | Admitting: Hematology and Oncology

## 2022-09-15 VITALS — BP 127/63 | HR 95 | Temp 97.7°F | Resp 16 | Ht 61.0 in | Wt 118.2 lb

## 2022-09-15 DIAGNOSIS — Z923 Personal history of irradiation: Secondary | ICD-10-CM | POA: Diagnosis not present

## 2022-09-15 DIAGNOSIS — C50311 Malignant neoplasm of lower-inner quadrant of right female breast: Secondary | ICD-10-CM | POA: Diagnosis present

## 2022-09-15 DIAGNOSIS — C50212 Malignant neoplasm of upper-inner quadrant of left female breast: Secondary | ICD-10-CM

## 2022-09-15 DIAGNOSIS — Z79811 Long term (current) use of aromatase inhibitors: Secondary | ICD-10-CM | POA: Diagnosis not present

## 2022-09-15 DIAGNOSIS — Z170 Estrogen receptor positive status [ER+]: Secondary | ICD-10-CM

## 2022-09-15 DIAGNOSIS — Z9013 Acquired absence of bilateral breasts and nipples: Secondary | ICD-10-CM | POA: Diagnosis not present

## 2022-09-15 DIAGNOSIS — Z79899 Other long term (current) drug therapy: Secondary | ICD-10-CM | POA: Diagnosis not present

## 2022-09-15 DIAGNOSIS — D649 Anemia, unspecified: Secondary | ICD-10-CM

## 2022-09-15 DIAGNOSIS — Z87891 Personal history of nicotine dependence: Secondary | ICD-10-CM | POA: Diagnosis not present

## 2022-09-15 DIAGNOSIS — Z90710 Acquired absence of both cervix and uterus: Secondary | ICD-10-CM | POA: Diagnosis not present

## 2022-09-15 DIAGNOSIS — C7951 Secondary malignant neoplasm of bone: Secondary | ICD-10-CM | POA: Diagnosis present

## 2022-09-15 DIAGNOSIS — C50312 Malignant neoplasm of lower-inner quadrant of left female breast: Secondary | ICD-10-CM | POA: Diagnosis present

## 2022-09-15 DIAGNOSIS — G893 Neoplasm related pain (acute) (chronic): Secondary | ICD-10-CM | POA: Diagnosis not present

## 2022-09-15 LAB — CMP (CANCER CENTER ONLY)
ALT: 61 U/L — ABNORMAL HIGH (ref 0–44)
AST: 64 U/L — ABNORMAL HIGH (ref 15–41)
Albumin: 2.7 g/dL — ABNORMAL LOW (ref 3.5–5.0)
Alkaline Phosphatase: 72 U/L (ref 38–126)
Anion gap: 11 (ref 5–15)
BUN: 17 mg/dL (ref 8–23)
CO2: 27 mmol/L (ref 22–32)
Calcium: 8 mg/dL — ABNORMAL LOW (ref 8.9–10.3)
Chloride: 95 mmol/L — ABNORMAL LOW (ref 98–111)
Creatinine: 1.26 mg/dL — ABNORMAL HIGH (ref 0.44–1.00)
GFR, Estimated: 47 mL/min — ABNORMAL LOW (ref >60)
Glucose, Bld: 105 mg/dL — ABNORMAL HIGH (ref 70–99)
Potassium: 3.4 mmol/L — ABNORMAL LOW (ref 3.5–5.1)
Sodium: 133 mmol/L — ABNORMAL LOW (ref 135–145)
Total Bilirubin: 0.4 mg/dL (ref 0.3–1.2)
Total Protein: 6.4 g/dL — ABNORMAL LOW (ref 6.5–8.1)

## 2022-09-15 LAB — CBC WITH DIFFERENTIAL/PLATELET
Abs Immature Granulocytes: 0.14 10*3/uL — ABNORMAL HIGH (ref 0.00–0.07)
Basophils Absolute: 0 10*3/uL (ref 0.0–0.1)
Basophils Relative: 0 %
Eosinophils Absolute: 0.1 10*3/uL (ref 0.0–0.5)
Eosinophils Relative: 1 %
HCT: 24.5 % — ABNORMAL LOW (ref 36.0–46.0)
Hemoglobin: 8.1 g/dL — ABNORMAL LOW (ref 12.0–15.0)
Immature Granulocytes: 1 %
Lymphocytes Relative: 13 %
Lymphs Abs: 1.3 10*3/uL (ref 0.7–4.0)
MCH: 28.4 pg (ref 26.0–34.0)
MCHC: 33.1 g/dL (ref 30.0–36.0)
MCV: 86 fL (ref 80.0–100.0)
Monocytes Absolute: 0.8 10*3/uL (ref 0.1–1.0)
Monocytes Relative: 8 %
Neutro Abs: 7.9 10*3/uL — ABNORMAL HIGH (ref 1.7–7.7)
Neutrophils Relative %: 77 %
Platelets: 219 10*3/uL (ref 150–400)
RBC: 2.85 MIL/uL — ABNORMAL LOW (ref 3.87–5.11)
RDW: 14.7 % (ref 11.5–15.5)
WBC: 10.3 10*3/uL (ref 4.0–10.5)
nRBC: 0.2 % (ref 0.0–0.2)

## 2022-09-15 LAB — MAGNESIUM: Magnesium: 1.1 mg/dL — ABNORMAL LOW (ref 1.7–2.4)

## 2022-09-15 MED ORDER — MAGNESIUM OXIDE -MG SUPPLEMENT 400 (240 MG) MG PO TABS: 400.0000 mg | ORAL_TABLET | Freq: Two times a day (BID) | ORAL | 0 refills | Status: DC

## 2022-09-15 NOTE — Progress Notes (Signed)
Oral mag replacement ordered. PRBC transfusion ordered.  Krystal Brooks

## 2022-09-15 NOTE — Telephone Encounter (Signed)
Attempted to call pt to offer appt for 1 unit PRBC per MD. Pt would need to come in for sample to BB. We would like to offer pt appt for 0730 lab and 0830 blood transfusion. LVM for her to call us back and I will relay this to oncoming nurse.

## 2022-09-15 NOTE — Progress Notes (Signed)
[facility]  [phone] [phone]     ID: ANTHONY ROLAND DOB: Jun 19, 1956  MR#: [id number]  ZSW#:[id number]  Patient Care Team: Susy Frizzle, MD as PCP - General (Family Medicine) Excell Seltzer, MD (Inactive) as Consulting Physician (General Surgery) Druscilla Brownie, MD as Consulting Physician (Dermatology) Dillingham, [name], DO as Attending Physician (Plastic Surgery) Delice Bison Charlestine Massed, NP as Nurse Practitioner (Hematology and Oncology) Edythe Clarity, [facility] as Pharmacist (Pharmacist) Benay Pike, MD as Medical Oncologist (Hematology and Oncology)  CHIEF COMPLAINT: Recurrent estrogen receptor positive breast cancer (s/p bilateral mastectomies)  CURRENT TREATMENT: Anastrozole  INTERVAL HISTORY:  When she came for her last visit, we ordered a PET CT given new concern for metastatic disease. I also changed her medication to anastrozole since she was on tamoxifen for progression. Since last visit, she was admitted to the hospital with chief complaint of nausea, vomiting and AKI. She says she has absolutely no back pain for the past few days. She feels very fatigued. Daughter has list of questions for [place] today Family also was wondering to seek another opinion at [facility]. Rest of the pertinent 10 point ROS reviewed and negative.   COVID 19 VACCINATION STATUS: [place] x3, last 05/2020; infection 02/2021   BREAST CANCER HISTORY: From Dr Ernestina Penna 01/14/16 note:   "[name] 67 y.o. female is here because of Her newly diagnosed right breast cancer. She is accompanied by her husband and of friend to our multidisciplinary rest clinic today.   This was found by screening mammogram,  she denies any palpable mass, skin change or nipple discharge, or ulcer constitutional symptoms before the screening.  Her prior mammo 6 years ago. She had a right breast cyst removed in 1979. "   Malignant neoplasm of lower-inner quadrant of right breast  of female, estrogen receptor positive ([place])    12/29/2015 Mammogram      Diagnostic right mammogram showed a 2.3 x 2.1 x 2.2 cm group of coarse calcification within the medial slightly lower right breast.         01/02/2016 Initial Diagnosis      Breast cancer of lower-inner quadrant of right female breast ([place])         01/02/2016 Initial Biopsy      right breast LIQ core needle biopsy showed LCIS, with a small focus of invasive lobular carcinoma, grade 2.          01/02/2016 Receptors her2      ER 10% positive, moderate staining. PR negative. Insufficient tissue for HER-2 testing.         01/27/2016 Surgery      Right breast lumpectomy and sentinel lymph node biopsy ([place])         01/27/2016 Pathology Results      Right breast lumpectomy showed pleomorphic variant of lobular carcinoma in situ, and scattered microscopic foci of invasive lobular carcinoma (all less than 0.1 cm) in different sections, total 0.6 cm. 0/2 SLN.          01/27/2016 Receptors her2      The surgical sample of the invasive carcinoma was not sufficient for Her2 test and other additional studies.         03/09/2016 - 04/19/2016 Radiation Therapy      Adjuvant breast radiation [name]). Right breast: 50 Gy in 25 fractions. Right breast "boost": 10 Gy in 5 fractions.           04/2016 -  Anti-estrogen oral therapy  Anastrozole 1 mg daily. Planned duration of therapy: 5 years        PAST MEDICAL HISTORY: Past Medical History:  Diagnosis Date   Adie's pupil    LEFT EYE   Allergy    Asthma    Atherosclerosis of aorta (HCC)    bulge above renal arteries on mri 2016   Cancer ([place]) 2017   right breast ca   Cellulitis    chest   Complication of anesthesia    VERY HARD TO WAKE UP     Eczema    Hypertension    Personal history of radiation therapy    PONV (postoperative nausea and vomiting)    Smoker     PAST SURGICAL HISTORY: Past Surgical History:  Procedure Laterality Date   ABDOMINAL  HYSTERECTOMY     BREAST BIOPSY Right 01/02/2016   BREAST EXCISIONAL BIOPSY Right 1979   BREAST IMPLANT REMOVAL Bilateral 10/05/2017   Procedure: REMOVALOF BILATERAL BREAST IMPLANTS;  Surgeon: Wallace Going, DO;  Location: [place];  Service: Plastics;  Laterality: Bilateral;   BREAST LUMPECTOMY Right 01/27/2016   BREAST LUMPECTOMY WITH RADIOACTIVE SEED AND SENTINEL LYMPH NODE BIOPSY Right 01/27/2016   Procedure: BREAST LUMPECTOMY WITH RADIOACTIVE SEED AND SENTINEL LYMPH NODE BIOPSY;  Surgeon: Excell Seltzer, MD;  Location: [place];  Service: General;  Laterality: Right;   BREAST RECONSTRUCTION WITH PLACEMENT OF TISSUE EXPANDER AND FLEX HD (ACELLULAR HYDRATED DERMIS) Bilateral 03/16/2017   Procedure: BILATERAL IMMEDIATE BREAST RECONSTRUCTION WITH PLACEMENT OF TISSUE EXPANDER AND FLEX HD (ACELLULAR HYDRATED DERMIS);  Surgeon: Wallace Going, DO;  Location: [place];  Service: Plastics;  Laterality: Bilateral;   BREAST SURGERY  1979   cyst removed rt breast   CATARACT EXTRACTION W/ INTRAOCULAR LENS IMPLANT     RT EYE   CESAREAN SECTION     x2   EXCISION OF BREAST LESION Left 01/30/2018   Procedure: EXCISION OF LEFT BREAST SCAR;  Surgeon: Wallace Going, DO;  Location: [place];  Service: Plastics;  Laterality: Left;   LATISSIMUS FLAP TO BREAST Right 01/30/2018   Procedure: RIGHT LATISSIMUS MYOCUTANEOUS FLAP FOR RIGHT CHEST WALL RECONSTRUCTION;  Surgeon: Wallace Going, DO;  Location: [place];  Service: Plastics;  Laterality: Right;   MASTECTOMY W/ SENTINEL NODE BIOPSY Bilateral 03/16/2017   Procedure: BILATERAL TOTAL MASTECTOMY WITH LEFT SENTINEL LYMPH NODE BIOPSY;  Surgeon: Excell Seltzer, MD;  Location: [place];  Service: General;  Laterality: Bilateral;   REMOVAL OF BILATERAL TISSUE EXPANDERS WITH PLACEMENT OF BILATERAL BREAST IMPLANTS Bilateral 06/15/2017   Procedure: REMOVAL OF BILATERAL TISSUE EXPANDERS WITH PLACEMENT OF BILATERAL BREAST  IMPLANTS;  Surgeon: Wallace Going, DO;  Location: [place];  Service: Plastics;  Laterality: Bilateral;   TOTAL MASTECTOMY Bilateral 03/16/2017   SENTINAL NODE BIOPSY    FAMILY HISTORY Family History  Problem Relation Age of Onset   Hearing loss Mother    Asthma Father    Cancer Father        melanoma, metastases    Diabetes Father    Heart disease Father    Early death Sister    Early death Paternal Grandfather    Cancer Maternal Grandmother 88       colon cancer    Breast cancer Neg Hx    Ovarian cancer Neg Hx   The patient's father died at age 85. He had been diagnosed with melanoma in 1 year. That was not the cause of death. The patient's  mother is living at age 76 as of May 2018. The patient has one brother, no sisters. There is no history of breast or ovarian cancer in the family, and no other melanoma history.  GYNECOLOGIC HISTORY:  No LMP recorded. Patient has had a hysterectomy. 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 She frequently keeps her 2 grandchildren. Her husband Clair Gulling is a retired Airline pilot. He now owns a business where they prepare the connection between the front living in/RV portion and a horse trailer: They do the hitch combination. Daughter April lives in [place] where she works as a Statistician. Daughter Ailene Ravel lives in [place] where she is an Forensic scientist for [employer]. The patient has 4 grandchildren. She is a Tourist information centre manager.    ADVANCED DIRECTIVES: The patient's husband is automatically her healthcare power of attorney   HEALTH MAINTENANCE: Social History   Tobacco Use   Smoking status: Former    Packs/day: 0.50    Years: 40.00    Total pack years: 20.00    Types: Cigarettes   Smokeless tobacco: Never   Vaping Use   Vaping Use: Never used  Substance Use Topics   Alcohol use: Yes    Alcohol/week: 0.0 standard drinks of alcohol    Comment: social   Drug use: No     Colonoscopy:  PAP:  Bone density:05/24/2016 showed a T score of -0.6 normal   Allergies  Allergen Reactions   Gabapentin Other (See Comments)    Pt states sedates her   Penicillins Anaphylaxis, Swelling, Rash and Other (See Comments)    Older sister died from reaction to penicillin Has patient had a PCN reaction causing immediate rash, facial/tongue/throat swelling, SOB or lightheadedness with hypotension: Yes Has patient had a PCN reaction causing severe rash involving mucus membranes or skin necrosis: No Has patient had a PCN reaction that required hospitalization: No Has patient had a PCN reaction occurring within the last 10 years: No If all of the above answers are "NO", then may proceed with Cephalosporin use.    Sulfa Antibiotics Rash   Levaquin [Levofloxacin] Nausea And Vomiting    Current Outpatient Medications  Medication Sig Dispense Refill   albuterol (PROVENTIL) (2.5 MG/3ML) 0.083% nebulizer solution TAKE 3 MLS (2.5 MG TOTAL) BY NEBULIZATION EVERY 6 HOURS AS NEEDED WHEEZING OR SHORTNESS OF BREATH. (Patient taking differently: Take 2.5 mg by nebulization every 6 (six) hours as needed for wheezing or shortness of breath.) 300 mL 5   albuterol (VENTOLIN HFA) 108 (90 Base) MCG/ACT inhaler INHALE 1-2 PUFFS EVERY 6 HOURS AS NEEDED FOR WHEEZING. (Patient taking differently: Inhale 1-2 puffs into the lungs every 6 (six) hours as needed for wheezing. INHALE 1-2 PUFFS EVERY 6 HOURS AS NEEDED FOR WHEEZING.) 18 each 5   anastrozole (ARIMIDEX) 1 MG tablet Take 1 tablet (1 mg total) by mouth daily. (Patient taking differently: Take 1 mg by mouth at bedtime.) 90 tablet 3   budesonide-formoterol (SYMBICORT) 80-4.5 MCG/ACT inhaler Inhale 2 puffs into the lungs 2 (two) times daily as needed ("for flares").     escitalopram  (LEXAPRO) 10 MG tablet Take 1 tablet (10 mg total) by mouth daily. (Patient taking differently: Take 10 mg by mouth at bedtime.) 30 tablet 0   feeding supplement (ENSURE ENLIVE / ENSURE  PLUS) LIQD Take 237 mLs by mouth 2 (two) times daily between meals. 10000 mL 0   fluticasone-salmeterol (ADVAIR HFA) 115-21 MCG/ACT inhaler Inhale 2 puffs into the lungs 2 (two) times daily. (Patient not taking: Reported on 09/11/2022) 1 each 12   montelukast (SINGULAIR) 10 MG tablet Take 1 tablet (10 mg total) by mouth daily. (Patient taking differently: Take 10 mg by mouth at bedtime.) 30 tablet 11   Multiple Vitamin (MULTIVITAMIN WITH MINERALS) TABS tablet Take 1 tablet by mouth daily. 60 tablet 0   potassium chloride (KLOR-CON M10) 10 MEQ tablet Take 1 tablet (10 mEq total) by mouth daily. 30 tablet 1   promethazine (PHENERGAN) 12.5 MG tablet Take 1 tablet (12.5 mg total) by mouth every 6 (six) hours as needed for nausea or vomiting. 20 tablet 0   traMADol (ULTRAM) 50 MG tablet Take 1 tablet (50 mg total) by mouth every 8 (eight) hours as needed. (Patient taking differently: Take 50 mg by mouth every 8 (eight) hours as needed (for pain).) 45 tablet 0   No current facility-administered medications for this visit.    OBJECTIVE: White woman in no acute distress  There were no vitals filed for this visit.     There is no height or weight on file to calculate BMI.    ECOG FS:1 - Symptomatic but completely ambulatory  PE deferred in lieu of counseling.  LAB RESULTS:  CMP     Component Value Date/Time   NA 129 (L) 09/12/2022 0751   NA 137 07/13/2017 1035   K 3.6 09/12/2022 0751   K 4.3 07/13/2017 1035   CL 102 09/12/2022 0751   CO2 21 (L) 09/12/2022 0751   CO2 27 07/13/2017 1035   GLUCOSE 95 09/12/2022 0751   GLUCOSE 94 07/13/2017 1035   BUN 26 (H) 09/12/2022 0751   BUN 15.4 07/13/2017 1035   CREATININE 2.09 (H) 09/12/2022 0751   CREATININE 0.90 07/14/2022 1141   CREATININE 1.02 05/11/2022 0814    CREATININE 0.8 07/13/2017 1035   CALCIUM 7.4 (L) 09/12/2022 0751   CALCIUM 9.3 07/13/2017 1035   PROT 5.6 (L) 09/12/2022 0751   PROT 7.7 07/13/2017 1035   ALBUMIN 2.4 (L) 09/12/2022 0751   ALBUMIN 3.5 07/13/2017 1035   AST 32 09/12/2022 0751   AST 29 07/14/2022 1141   AST 15 07/13/2017 1035   ALT 38 09/12/2022 0751   ALT 32 07/14/2022 1141   ALT 14 07/13/2017 1035   ALKPHOS 70 09/12/2022 0751   ALKPHOS 73 07/13/2017 1035   BILITOT 0.4 09/12/2022 0751   BILITOT 0.3 07/14/2022 1141   BILITOT 0.22 07/13/2017 1035   GFRNONAA 25 (L) 09/12/2022 0751   GFRNONAA >60 07/14/2022 1141   GFRNONAA 64 12/15/2017 1541   GFRAA >60 06/06/2019 1102   GFRAA 50 (L) 06/20/2018 0930   GFRAA 74 12/15/2017 1541    No results found for: "TOTALPROTELP", "ALBUMINELP", "A1GS", "A2GS", "BETS", "BETA2SER", "GAMS", "MSPIKE", "SPEI"  No results found for: "KPAFRELGTCHN", "LAMBDASER", "KAPLAMBRATIO"  Lab Results  Component Value Date   WBC 8.9 09/11/2022   NEUTROABS 8.5 (H) 09/10/2022   HGB 8.4 (L) 09/11/2022   HCT 26.4 (L) 09/11/2022   MCV 89.5 09/11/2022   PLT 285 09/11/2022      Chemistry      Component Value Date/Time   NA 129 (L) 09/12/2022 0751   NA 137 07/13/2017 1035   K 3.6 09/12/2022 0751   K 4.3 07/13/2017 1035   CL 102 09/12/2022 0751  CO2 21 (L) 09/12/2022 0751   CO2 27 07/13/2017 1035   BUN 26 (H) 09/12/2022 0751   BUN 15.4 07/13/2017 1035   CREATININE 2.09 (H) 09/12/2022 0751   CREATININE 0.90 07/14/2022 1141   CREATININE 1.02 05/11/2022 0814   CREATININE 0.8 07/13/2017 1035      Component Value Date/Time   CALCIUM 7.4 (L) 09/12/2022 0751   CALCIUM 9.3 07/13/2017 1035   ALKPHOS 70 09/12/2022 0751   ALKPHOS 73 07/13/2017 1035   AST 32 09/12/2022 0751   AST 29 07/14/2022 1141   AST 15 07/13/2017 1035   ALT 38 09/12/2022 0751   ALT 32 07/14/2022 1141   ALT 14 07/13/2017 1035   BILITOT 0.4 09/12/2022 0751   BILITOT 0.3 07/14/2022 1141   BILITOT 0.22 07/13/2017 1035        No results found for: "LABCA2"  No components found for: "TKWIOX735"  No results for input(s): "INR" in the last 168 hours.  Urinalysis    Component Value Date/Time   COLORURINE YELLOW 09/10/2022 2131   APPEARANCEUR HAZY (A) 09/10/2022 2131   LABSPEC 1.010 09/10/2022 2131   PHURINE 5.0 09/10/2022 2131   GLUCOSEU NEGATIVE 09/10/2022 2131   HGBUR NEGATIVE 09/10/2022 2131   BILIRUBINUR NEGATIVE 09/10/2022 2131   KETONESUR NEGATIVE 09/10/2022 2131   PROTEINUR NEGATIVE 09/10/2022 2131   NITRITE NEGATIVE 09/10/2022 2131   LEUKOCYTESUR NEGATIVE 09/10/2022 2131     STUDIES: US RENAL  Result Date: 09/10/2022 CLINICAL DATA:  Acute renal failure EXAM: RENAL / URINARY TRACT ULTRASOUND COMPLETE COMPARISON:  PET CT 09/10/2022 FINDINGS: Right Kidney: Renal measurements: 9.7 x 4.8 x 4.9 cm = volume: 118.4 mL. Echogenicity within normal limits. No mass or hydronephrosis visualized. Left Kidney: Renal measurements: 9.6 x 4.7 x 4.7 cm = volume: 111.9 mL. Echogenicity within normal limits. No mass or hydronephrosis visualized. Bladder: Appears normal for degree of bladder distention. Other: Multiple hypoechoic liver masses consistent with metastatic disease. IMPRESSION: 1. Normal ultrasound appearance of the kidneys 2. Multiple liver masses consistent with metastatic disease. Electronically Signed   By: Donavan Foil M.D.   On: 09/10/2022 21:14   NM PET Image Initial (PI) Skull Base To Thigh (F-18 FDG)  Result Date: 09/10/2022 CLINICAL DATA:  Initial treatment strategy for metastatic carcinoma. History of lobular breast cancer. EXAM: NUCLEAR MEDICINE PET SKULL BASE TO THIGH TECHNIQUE: 5.99 mCi F-18 FDG was injected intravenously. Full-ring PET imaging was performed from the skull base to thigh after the radiotracer. CT data was obtained and used for attenuation correction and anatomic localization. Fasting blood glucose: 119 mg/dl COMPARISON:  None Available. FINDINGS: Mediastinal blood pool  activity: SUV max 1.3 NECK: No hypermetabolic cervical lymph nodes are identified.Fairly symmetric activity within the lymphoid tissue of Waldeyer's ring is within physiologic limits.No suspicious activity identified within the pharyngeal mucosal space. Incidental CT findings: Bilateral carotid atherosclerosis. CHEST: Focal hypermetabolic activity in the subcarinal space may relate to a lymph node or the midesophagus (SUV max 4.5). No esophageal dilatation. No other hypermetabolic mediastinal, hilar, axillary or internal mammary lymph nodes. No hypermetabolic pulmonary activity or suspicious nodularity. Incidental CT findings: Mild centrilobular emphysema with scattered parenchymal scarring and patchy ground-glass opacities. There is a small hiatal hernia. Diffuse aortic atherosclerosis with lesser involvement of the great vessels and coronary arteries. No esophageal dilatation. ABDOMEN/PELVIS: Widespread hypermetabolic metastatic disease throughout the liver. There is an approximately 3.5 cm lesion in the dome of the right hepatic lobe which has an SUV max of 6.8. There is a large  lesion in the left lobe measuring up to 4.1 cm on image 120/2 with an SUV max of 8.5. No hypermetabolic activity identified within the spleen, pancreas or adrenal glands. There is no hypermetabolic nodal activity in the abdomen or pelvis. Incidental CT findings: Aortic and branch vessel atherosclerosis with a small infrarenal abdominal aortic aneurysm which measures up to 2.8 cm transverse on image 147/2. Previous hysterectomy. SKELETON: Widespread hypermetabolic osseous metastatic disease with multiple lytic and sclerotic lesions. For example, a sternal manubrial lesion has an SUV max of 4.6. Lesion within the T12 vertebral body has an SUV max of 7.5. Mild superior endplate compression deformities at T9 and T12 which may be pathologic. Mild proximal femoral involvement bilaterally without evidence of impending pathologic fracture.  Incidental CT findings: No significant epidural tumor identified. There is a convex left lumbar scoliosis with multilevel spondylosis. Evidence of prior bilateral mastectomy without chest wall recurrence. IMPRESSION: 1. Widespread hypermetabolic hepatic and osseous metastatic disease as described. 2. Indeterminate hypermetabolic activity in the subcarinal space which may relate to the mid esophagus or a subcarinal lymph node. 3. No evidence of breast/chest wall recurrence. 4. 2.8 cm infrarenal abdominal aortic aneurysm. Recommend follow-up every 5 years. Reference: J Am Coll Radiol 6734;19:379-024. 5. Aortic Atherosclerosis (ICD10-I70.0) and Emphysema (ICD10-J43.9). Electronically Signed   By: Richardean Sale M.D.   On: 09/10/2022 15:11     ELIGIBLE FOR AVAILABLE RESEARCH PROTOCOL:   ASSESSMENT: 67 y.o. [place] woman, with bilateral breast cancer   (1) status post right lumpectomy and sentinel lymph node sampling 01/27/2016 for lobular carcinoma in situ, pleomorphic variant, with multiple foci of invasive lobular carcinoma, all less than a millimeter, but with negative margins and both sentinel lymph nodes negative, invasive disease being estrogen receptor positive, HER-2 not tested (pT1 [mic] pN0}   (2) adjuvant radiation 03/09/2016 through 04/19/2016  The patient initially received a dose of 50 Gy in 25 fractions to the breast using whole-breast tangent fields. This was delivered using a 3-D conformal technique. The patient then received a boost to the seroma. This delivered an additional 10 Gy in 5 fractions using an en face electron field due to the depth of the seroma. The total dose was 60 Gy.   (3) anastrozole started June 2017, discontinued July 2018   (4) status post left breast lower inner quadrant biopsy 12/29/2016 for a clinical T1c N0  invasive lobular breast cancer, grade 2, estrogen receptor positive, progesterone receptor negative HER-2 nonamplified, with a signals ratio 1.35 and the  number per cell 2.70.  (5) intermediate Oncotype score of 22 predicts a 10 year risk of recurrence outside the breast of 14% if the patient's only systemic therapy is tamoxifen for 5 years.  (a) the patient opted against chemotherapy Lovena Le Rx results confirm choice)  (6) status post bilateral mastectomies and left sentinel lymph node sampling 03/16/2017 showing  (a) on the right, no evidence of carcinoma  (b) on the left, a pT2 pN0, stage IIA invasive lobular carcinoma, grade 2, with negative margins.  (7) had immediate expander placement at the time of bilateral mastectomies  (a) definitive implant placement with bilateral capsulotomies June 15, 2017, with benign pathology  (b) implants removed February 2019 because of chest discomfort  (c) status post right latissimus flap reconstruction and left capsulotomy 01/30/2018 with benign pathology  (d) decided to have all implants removed with no further reconstruction.  (Per Dr. Dalbert Mayotte note 03/06/2019).  (8) started tamoxifen 04/30/2017-- plan for total 10 years of antiestrogens given lobular  histology   PLAN:  #1 Since her last visit here, she had an MRI for worsening back pain which showed concern for diffuse osseous metastatic disease.  MRI indeed was concerning for metastatic cancer, likely metastatic breast cancer however we will need a biopsy to confirm this.  We have reviewed that invasive lobular carcinoma does have a propensity to metastasize to the bone.   She is here to review PET CT results. PET CT showed wide spread hypermetabolic hepatic and osseous met disease. Indeterminate hypermetabolic activity in subcarinal space. 2.8 cm AAA. She is scheduled for CT guided biopsy next week. We will also plan to send foundation one on new specimen.  #2 for cancer related pain, have sent tramadol 50 mg every 8 hours as needed for pain.   She denies any pain for the past 2 weeks and didn't have to take tramadol.  #3 She was once  again admitted for nausea, vomiting and AKI and recovering.  #4, normocytic normochromic anemia likely from marrow replacement. She is very fatigued. Since she is very asymptomatic and since Hb very close to 8 g/dl, we will arrange for transfusion PRBC   We have discussed that metastatic cancer is incurable, we have several treatment options however we cannot cure the cancer. Treatment options will be dependent on repeat biopsy and findings.  #5 Dental clearance, she needs some extractions working with her oral surgeon to have this taken care of. We are also awaiting CMP from today  Referral sent to [facility] [place], Dr Harriett Rush for second opinion.  Total time spent: 45 minutes  *Total Encounter Time as defined by the Centers for Medicare and Medicaid Services includes, in addition to the face-to-face time of a patient visit (documented in the note above) non-face-to-face time: obtaining and reviewing outside history, ordering and reviewing medications, tests or procedures, care coordination (communications with other health care professionals or caregivers) and documentation in the medical record.

## 2022-09-16 ENCOUNTER — Other Ambulatory Visit: Payer: Self-pay | Admitting: *Deleted

## 2022-09-16 ENCOUNTER — Other Ambulatory Visit: Payer: Self-pay | Admitting: Hematology and Oncology

## 2022-09-16 ENCOUNTER — Other Ambulatory Visit (HOSPITAL_COMMUNITY): Payer: Medicare HMO

## 2022-09-16 ENCOUNTER — Inpatient Hospital Stay: Payer: Medicare HMO

## 2022-09-16 DIAGNOSIS — G893 Neoplasm related pain (acute) (chronic): Secondary | ICD-10-CM | POA: Diagnosis not present

## 2022-09-16 DIAGNOSIS — C50312 Malignant neoplasm of lower-inner quadrant of left female breast: Secondary | ICD-10-CM | POA: Diagnosis not present

## 2022-09-16 DIAGNOSIS — D649 Anemia, unspecified: Secondary | ICD-10-CM

## 2022-09-16 DIAGNOSIS — Z923 Personal history of irradiation: Secondary | ICD-10-CM | POA: Diagnosis not present

## 2022-09-16 DIAGNOSIS — Z79811 Long term (current) use of aromatase inhibitors: Secondary | ICD-10-CM | POA: Diagnosis not present

## 2022-09-16 DIAGNOSIS — Z87891 Personal history of nicotine dependence: Secondary | ICD-10-CM | POA: Diagnosis not present

## 2022-09-16 DIAGNOSIS — C50212 Malignant neoplasm of upper-inner quadrant of left female breast: Secondary | ICD-10-CM

## 2022-09-16 DIAGNOSIS — Z170 Estrogen receptor positive status [ER+]: Secondary | ICD-10-CM | POA: Diagnosis not present

## 2022-09-16 DIAGNOSIS — Z90710 Acquired absence of both cervix and uterus: Secondary | ICD-10-CM | POA: Diagnosis not present

## 2022-09-16 DIAGNOSIS — C50311 Malignant neoplasm of lower-inner quadrant of right female breast: Secondary | ICD-10-CM | POA: Diagnosis not present

## 2022-09-16 DIAGNOSIS — Z9013 Acquired absence of bilateral breasts and nipples: Secondary | ICD-10-CM | POA: Diagnosis not present

## 2022-09-16 DIAGNOSIS — C7951 Secondary malignant neoplasm of bone: Secondary | ICD-10-CM | POA: Diagnosis not present

## 2022-09-16 DIAGNOSIS — Z79899 Other long term (current) drug therapy: Secondary | ICD-10-CM | POA: Diagnosis not present

## 2022-09-16 LAB — CBC WITH DIFFERENTIAL/PLATELET
Abs Immature Granulocytes: 0.15 10*3/uL — ABNORMAL HIGH (ref 0.00–0.07)
Basophils Absolute: 0 10*3/uL (ref 0.0–0.1)
Basophils Relative: 0 %
Eosinophils Absolute: 0.1 10*3/uL (ref 0.0–0.5)
Eosinophils Relative: 1 %
HCT: 24.3 % — ABNORMAL LOW (ref 36.0–46.0)
Hemoglobin: 7.8 g/dL — ABNORMAL LOW (ref 12.0–15.0)
Immature Granulocytes: 1 %
Lymphocytes Relative: 12 %
Lymphs Abs: 1.3 10*3/uL (ref 0.7–4.0)
MCH: 28 pg (ref 26.0–34.0)
MCHC: 32.1 g/dL (ref 30.0–36.0)
MCV: 87.1 fL (ref 80.0–100.0)
Monocytes Absolute: 0.8 10*3/uL (ref 0.1–1.0)
Monocytes Relative: 7 %
Neutro Abs: 8 10*3/uL — ABNORMAL HIGH (ref 1.7–7.7)
Neutrophils Relative %: 79 %
Platelets: 210 10*3/uL (ref 150–400)
RBC: 2.79 MIL/uL — ABNORMAL LOW (ref 3.87–5.11)
RDW: 14.9 % (ref 11.5–15.5)
WBC: 10.4 10*3/uL (ref 4.0–10.5)
nRBC: 0 % (ref 0.0–0.2)

## 2022-09-16 LAB — COMPREHENSIVE METABOLIC PANEL
ALT: 78 U/L — ABNORMAL HIGH (ref 0–44)
AST: 72 U/L — ABNORMAL HIGH (ref 15–41)
Albumin: 2.9 g/dL — ABNORMAL LOW (ref 3.5–5.0)
Alkaline Phosphatase: 77 U/L (ref 38–126)
Anion gap: 9 (ref 5–15)
BUN: 14 mg/dL (ref 8–23)
CO2: 29 mmol/L (ref 22–32)
Calcium: 8.2 mg/dL — ABNORMAL LOW (ref 8.9–10.3)
Chloride: 95 mmol/L — ABNORMAL LOW (ref 98–111)
Creatinine, Ser: 1.07 mg/dL — ABNORMAL HIGH (ref 0.44–1.00)
GFR, Estimated: 57 mL/min — ABNORMAL LOW (ref >60)
Glucose, Bld: 110 mg/dL — ABNORMAL HIGH (ref 70–99)
Potassium: 3.6 mmol/L (ref 3.5–5.1)
Sodium: 133 mmol/L — ABNORMAL LOW (ref 135–145)
Total Bilirubin: 0.4 mg/dL (ref 0.3–1.2)
Total Protein: 6 g/dL — ABNORMAL LOW (ref 6.5–8.1)

## 2022-09-16 LAB — PREPARE RBC (CROSSMATCH)

## 2022-09-16 LAB — ABO/RH: ABO/RH(D): A POS

## 2022-09-16 NOTE — Progress Notes (Signed)
Pt will be receiving prbcs on Friday. Recommended to come in on today to have labs drawn due to not having blood transfusion through [facility]. Pt agreed to come in on today at 1130 am to have labs drawn. Pt verbalized understanding.

## 2022-09-16 NOTE — Progress Notes (Signed)
Called pt daughter to make aware that pt is needing 2 units of prbcs on Friday and made aware of new time, 8:30 am. Pt daughter verbalized understanding.

## 2022-09-17 ENCOUNTER — Other Ambulatory Visit: Payer: Self-pay

## 2022-09-17 ENCOUNTER — Other Ambulatory Visit: Payer: Medicare HMO

## 2022-09-17 ENCOUNTER — Other Ambulatory Visit: Payer: Self-pay | Admitting: *Deleted

## 2022-09-17 ENCOUNTER — Inpatient Hospital Stay: Payer: Medicare HMO

## 2022-09-17 ENCOUNTER — Telehealth: Payer: Self-pay | Admitting: *Deleted

## 2022-09-17 DIAGNOSIS — C50212 Malignant neoplasm of upper-inner quadrant of left female breast: Secondary | ICD-10-CM

## 2022-09-17 DIAGNOSIS — E871 Hypo-osmolality and hyponatremia: Secondary | ICD-10-CM

## 2022-09-17 DIAGNOSIS — Z9013 Acquired absence of bilateral breasts and nipples: Secondary | ICD-10-CM | POA: Diagnosis not present

## 2022-09-17 DIAGNOSIS — Z79899 Other long term (current) drug therapy: Secondary | ICD-10-CM | POA: Diagnosis not present

## 2022-09-17 DIAGNOSIS — C7951 Secondary malignant neoplasm of bone: Secondary | ICD-10-CM | POA: Diagnosis not present

## 2022-09-17 DIAGNOSIS — Z923 Personal history of irradiation: Secondary | ICD-10-CM | POA: Diagnosis not present

## 2022-09-17 DIAGNOSIS — Z170 Estrogen receptor positive status [ER+]: Secondary | ICD-10-CM | POA: Diagnosis not present

## 2022-09-17 DIAGNOSIS — Z79811 Long term (current) use of aromatase inhibitors: Secondary | ICD-10-CM | POA: Diagnosis not present

## 2022-09-17 DIAGNOSIS — D649 Anemia, unspecified: Secondary | ICD-10-CM

## 2022-09-17 DIAGNOSIS — C50311 Malignant neoplasm of lower-inner quadrant of right female breast: Secondary | ICD-10-CM | POA: Diagnosis not present

## 2022-09-17 DIAGNOSIS — G893 Neoplasm related pain (acute) (chronic): Secondary | ICD-10-CM | POA: Diagnosis not present

## 2022-09-17 DIAGNOSIS — Z90710 Acquired absence of both cervix and uterus: Secondary | ICD-10-CM | POA: Diagnosis not present

## 2022-09-17 DIAGNOSIS — Z87891 Personal history of nicotine dependence: Secondary | ICD-10-CM | POA: Diagnosis not present

## 2022-09-17 DIAGNOSIS — C50312 Malignant neoplasm of lower-inner quadrant of left female breast: Secondary | ICD-10-CM | POA: Diagnosis not present

## 2022-09-17 MED ORDER — DIPHENHYDRAMINE HCL 25 MG PO CAPS
25.0000 mg | ORAL_CAPSULE | Freq: Once | ORAL | Status: AC
  Administered 2022-09-17: 25 mg via ORAL
  Filled 2022-09-17: qty 1

## 2022-09-17 MED ORDER — SODIUM CHLORIDE 0.9 % IV SOLN: Freq: Once | INTRAVENOUS | Status: DC

## 2022-09-17 MED ORDER — MAGNESIUM SULFATE 4 GM/100ML IV SOLN
4.0000 g | Freq: Once | INTRAVENOUS | Status: AC
  Administered 2022-09-17: 4 g via INTRAVENOUS
  Filled 2022-09-17: qty 100

## 2022-09-17 MED ORDER — POTASSIUM CHLORIDE 10 MEQ/100ML IV SOLN: 10.0000 meq | INTRAVENOUS | Status: DC

## 2022-09-17 MED ORDER — ACETAMINOPHEN 325 MG PO TABS
650.0000 mg | ORAL_TABLET | Freq: Once | ORAL | Status: AC
  Administered 2022-09-17: 650 mg via ORAL
  Filled 2022-09-17: qty 2

## 2022-09-17 MED ORDER — SODIUM CHLORIDE 0.9% IV SOLUTION
250.0000 mL | Freq: Once | INTRAVENOUS | Status: AC
  Administered 2022-09-17: 250 mL via INTRAVENOUS

## 2022-09-17 NOTE — Progress Notes (Signed)
Error

## 2022-09-17 NOTE — Telephone Encounter (Signed)
Spoke with patient's daughter April regarding palliative care referral and nutrition referral.  She states that would really like to have everyone involved. She has recently been hospitalized with nausea and vomiting. Placed referrals for above and gave contact information. Also emailed current support group opportunities and hirsch program if interested.  Encouraged to call if any needs arise.

## 2022-09-17 NOTE — Patient Instructions (Signed)
Blood Transfusion, Adult A blood transfusion is a procedure in which you receive blood or a type of blood cell (blood component) through an IV. You may need a blood transfusion when you have a low blood count, which is a low number of any blood cell. This may result from a bleeding disorder, illness, injury, or surgery. The blood may come from a donor, or you may be able to have your own blood collected and stored (autologous blood donation) before a planned surgery. The blood given in a transfusion may be made up of different blood components. You may receive: Red blood cells. These carry oxygen to the cells in the body. Platelets. These help your blood to clot. Plasma. This is the liquid part of your blood. It carries proteins and other substances throughout the body. White blood cells. These help you fight infections. If you have hemophilia or another clotting disorder, you may also receive other types of blood products. Depending on the type of blood product, this procedure may take 1-4 hours to complete. Tell a health care provider about: Any bleeding problems you have. Any previous reactions you have had during a blood transfusion. Any allergies you have. All medicines you are taking, including vitamins, herbs, eye drops, creams, and over-the-counter medicines. Any surgeries you have had. Any medical conditions you have. Whether you are pregnant or may be pregnant. What are the risks? Talk with your health care provider about risks. The most common problems include: A mild allergic reaction, such as red, swollen areas of skin (hives) and itching. Fever or chills. This may be the body's response to new blood cells received. This may occur during or up to 4 hours after the transfusion. More serious problems may include: A serious allergic reaction that causes difficulty breathing or swelling around the face and lips. Transfusion-associated circulatory overload (TACO), or too much fluid in  the lungs. This may cause breathing problems. Transfusion-related acute lung injury (TRALI), which causes breathing difficulty and low oxygen in the blood. This can occur within hours of the transfusion or several days later. Iron overload. This can happen after receiving many blood transfusions over a period of time. Infection or virus being transmitted. This is rare because donated blood is carefully tested before it is given. Hemolytic transfusion reaction. This is rare. It happens when the body's defense system (immune system)tries to attack the new blood cells. Symptoms may include fever, chills, nausea, low blood pressure, and low back or chest pain. Transfusion-associated graft-versus-host disease (TAGVHD). This is rare. It happens when donated cells attack the body's healthy tissues. What happens before the procedure? You will have a blood test to check your blood type. This test is done to know what kind of blood your body will accept and to match it to the donor blood. If you are going to have a planned surgery, you may be able to do an autologous blood donation. This may be done in case you need to have a transfusion. You will have your temperature, blood pressure, and pulse checked before the transfusion. If you have had an allergic reaction to a transfusion in the past, you may be given medicine to help prevent a reaction. This medicine may be given to you by mouth (orally) or through an IV. What happens during the procedure?  An IV will be inserted into one of your veins. The bag of blood will be attached to your IV. The blood will then enter through your vein. Your temperature, blood pressure,   and pulse will be monitored during the transfusion. This monitoring is done to detect early signs of a transfusion reaction. Tell your nurse right away if you have any of these symptoms during the transfusion: Shortness of breath or trouble breathing. Chest or back pain. Fever or  chills. Itching or hives. If you have any signs or symptoms of a reaction, your transfusion will be stopped and you may be given medicine. When the transfusion is complete, your IV will be removed. Pressure may be applied to the IV site for a few minutes. A bandage (dressing)will be applied. The procedure may vary among health care providers and hospitals. What happens after the procedure? Your temperature, blood pressure, pulse, breathing rate, and blood oxygen level will be monitored until you leave the hospital or clinic. Your blood may be tested to see how you have responded to the transfusion. You may be warmed with fluids or blankets to maintain a normal body temperature. If you receive your blood transfusion in an outpatient setting, you will be told whom to contact to report any reactions. Where to find more information Visit the [employer]: redcross.org Summary A blood transfusion is a procedure in which you receive blood or a type of blood cell (blood component) through an IV. The blood given in a transfusion may be made up of different blood components. You may receive red blood cells, platelets, plasma, or white blood cells depending on the condition treated. Your temperature, blood pressure, and pulse will be monitored before, during, and after the transfusion. After the transfusion, your blood may be tested to see how your body has responded. This information is not intended to replace advice given to you by your health care provider. Make sure you discuss any questions you have with your health care provider. Document Revised: 11/13/2021 Document Reviewed: 11/13/2021 Elsevier Patient Education  2023 [employer]  

## 2022-09-20 ENCOUNTER — Other Ambulatory Visit: Payer: Self-pay | Admitting: Family Medicine

## 2022-09-20 ENCOUNTER — Other Ambulatory Visit: Payer: Self-pay | Admitting: Physician Assistant

## 2022-09-20 DIAGNOSIS — I10 Essential (primary) hypertension: Secondary | ICD-10-CM

## 2022-09-20 DIAGNOSIS — Z01818 Encounter for other preprocedural examination: Secondary | ICD-10-CM

## 2022-09-20 LAB — TYPE AND SCREEN
ABO/RH(D): A POS
Antibody Screen: POSITIVE
Donor AG Type: NEGATIVE
Donor AG Type: NEGATIVE
PT AG Type: NEGATIVE
Unit division: 0
Unit division: 0

## 2022-09-20 LAB — BPAM RBC
Blood Product Expiration Date: 202402112359
Blood Product Expiration Date: 202402152359
ISSUE DATE / TIME: 202401190911
ISSUE DATE / TIME: 202401190911
Unit Type and Rh: 6200
Unit Type and Rh: 6200

## 2022-09-21 ENCOUNTER — Other Ambulatory Visit: Payer: Self-pay

## 2022-09-21 ENCOUNTER — Ambulatory Visit (HOSPITAL_COMMUNITY)
Admission: RE | Admit: 2022-09-21 | Discharge: 2022-09-21 | Disposition: A | Discharge status: Home / Self Care | Payer: Medicare HMO | Source: Ambulatory Visit | Attending: Hematology and Oncology | Admitting: Hematology and Oncology

## 2022-09-21 ENCOUNTER — Encounter (HOSPITAL_COMMUNITY): Payer: Self-pay

## 2022-09-21 ENCOUNTER — Ambulatory Visit (HOSPITAL_COMMUNITY)
Admission: RE | Admit: 2022-09-21 | Discharge: 2022-09-21 | Disposition: A | Discharge status: Home / Self Care | Payer: Medicare HMO | Source: Ambulatory Visit

## 2022-09-21 DIAGNOSIS — C50212 Malignant neoplasm of upper-inner quadrant of left female breast: Secondary | ICD-10-CM

## 2022-09-21 DIAGNOSIS — C787 Secondary malignant neoplasm of liver and intrahepatic bile duct: Secondary | ICD-10-CM | POA: Diagnosis not present

## 2022-09-21 DIAGNOSIS — Z853 Personal history of malignant neoplasm of breast: Secondary | ICD-10-CM | POA: Insufficient documentation

## 2022-09-21 DIAGNOSIS — Z01818 Encounter for other preprocedural examination: Secondary | ICD-10-CM

## 2022-09-21 DIAGNOSIS — K7689 Other specified diseases of liver: Secondary | ICD-10-CM | POA: Diagnosis not present

## 2022-09-21 DIAGNOSIS — R160 Hepatomegaly, not elsewhere classified: Secondary | ICD-10-CM | POA: Diagnosis not present

## 2022-09-21 LAB — CBC
HCT: 32.3 % — ABNORMAL LOW (ref 36.0–46.0)
Hemoglobin: 10.5 g/dL — ABNORMAL LOW (ref 12.0–15.0)
MCH: 28.3 pg (ref 26.0–34.0)
MCHC: 32.5 g/dL (ref 30.0–36.0)
MCV: 87.1 fL (ref 80.0–100.0)
Platelets: 182 10*3/uL (ref 150–400)
RBC: 3.71 MIL/uL — ABNORMAL LOW (ref 3.87–5.11)
RDW: 14.6 % (ref 11.5–15.5)
WBC: 11.1 10*3/uL — ABNORMAL HIGH (ref 4.0–10.5)
nRBC: 0 % (ref 0.0–0.2)

## 2022-09-21 LAB — PROTIME-INR
INR: 1.2 (ref 0.8–1.2)
Prothrombin Time: 14.7 seconds (ref 11.4–15.2)

## 2022-09-21 MED ORDER — DIPHENHYDRAMINE HCL 50 MG/ML IJ SOLN
INTRAMUSCULAR | Status: AC
  Filled 2022-09-21: qty 1

## 2022-09-21 MED ORDER — OXYCODONE HCL 5 MG PO TABS: 10.0000 mg | ORAL_TABLET | ORAL | Status: DC | PRN

## 2022-09-21 MED ORDER — MIDAZOLAM HCL 2 MG/2ML IJ SOLN
INTRAMUSCULAR | Status: AC
  Filled 2022-09-21: qty 2

## 2022-09-21 MED ORDER — GELATIN ABSORBABLE 12-7 MM EX MISC
CUTANEOUS | Status: AC
  Administered 2022-09-21: 1
  Filled 2022-09-21: qty 1

## 2022-09-21 MED ORDER — FENTANYL CITRATE (PF) 100 MCG/2ML IJ SOLN
INTRAMUSCULAR | Status: AC
  Filled 2022-09-21: qty 2

## 2022-09-21 MED ORDER — MIDAZOLAM HCL 2 MG/2ML IJ SOLN
INTRAMUSCULAR | Status: AC | PRN
  Administered 2022-09-21 (×2): .5 mg via INTRAVENOUS
  Administered 2022-09-21: 1 mg via INTRAVENOUS

## 2022-09-21 MED ORDER — LIDOCAINE HCL 1 % IJ SOLN
INTRAMUSCULAR | Status: AC
  Administered 2022-09-21: 10 mL
  Filled 2022-09-21: qty 20

## 2022-09-21 MED ORDER — FENTANYL CITRATE (PF) 100 MCG/2ML IJ SOLN
INTRAMUSCULAR | Status: AC | PRN
  Administered 2022-09-21 (×2): 50 ug via INTRAVENOUS

## 2022-09-21 MED ORDER — SODIUM CHLORIDE 0.9 % IV SOLN: INTRAVENOUS | Status: DC

## 2022-09-21 MED ORDER — DIPHENHYDRAMINE HCL 50 MG/ML IJ SOLN
INTRAMUSCULAR | Status: AC | PRN
  Administered 2022-09-21: 25 mg via INTRAVENOUS

## 2022-09-21 NOTE — Discharge Instructions (Signed)
Moderate Conscious Sedation, Adult, Care After This sheet gives you information about how to care for yourself after your procedure. Your health care provider may also give you more specific instructions. If you have problems or questions, contact your health care provider. What can I expect after the procedure? After the procedure, it is common to have: Sleepiness for several hours. Impaired judgment for several hours. Difficulty with balance. Vomiting if you eat too soon. Follow these instructions at home: For the time period you were told by your health care provider:     Rest. Do not participate in activities where you could fall or become injured. Do not drive or use machinery. Do not drink alcohol. Do not take sleeping pills or medicines that cause drowsiness. Do not make important decisions or sign legal documents. Do not take care of children on your own. Eating and drinking  Follow the diet recommended by your health care provider. Drink enough fluid to keep your urine pale yellow. If you vomit: Drink water, juice, or soup when you can drink without vomiting. Make sure you have little or no nausea before eating solid foods. General instructions Take over-the-counter and prescription medicines only as told by your health care provider. Have a responsible adult stay with you for the time you are told. It is important to have someone help care for you until you are awake and alert. Do not smoke. Keep all follow-up visits as told by your health care provider. This is important. Contact a health care provider if: You are still sleepy or having trouble with balance after 24 hours. You feel light-headed. You keep feeling nauseous or you keep vomiting. You develop a rash. You have a fever. You have redness or swelling around the IV site. Get help right away if: You have trouble breathing. You have new-onset confusion at home. Summary After the procedure, it is common to  feel sleepy, have impaired judgment, or feel nauseous if you eat too soon. Rest after you get home. Know the things you should not do after the procedure. Follow the diet recommended by your health care provider and drink enough fluid to keep your urine pale yellow. Get help right away if you have trouble breathing or new-onset confusion at home. This information is not intended to replace advice given to you by your health care provider. Make sure you discuss any questions you have with your health care provider. Document Revised: 12/14/2019 Document Reviewed: 07/12/2019 Elsevier Patient Education  [place].     Liver Biopsy, Care After After a liver biopsy, it is common to have these things in the area where the biopsy was done. You may: Have pain. Feel sore. Have bruising. You may also feel tired for a few days. Follow these instructions at home: Medicines Take over-the-counter and prescription medicines only as told by your doctor. If you were prescribed an antibiotic medicine, take it as told by your doctor. Do not stop taking the antibiotic, even if you start to feel better. Do not take medicines that may thin your blood. These medicines include aspirin and ibuprofen. Take them only if your doctor tells you to. If told, take steps to prevent problems with pooping (constipation). You may need to: Drink enough fluid to keep your pee (urine) pale yellow. Take medicines. You will be told what medicines to take. Eat foods that are high in fiber. These include beans, whole grains, and fresh fruits and vegetables. Limit foods that are high in fat and  sugar. These include fried or sweet foods. Ask your doctor if you should avoid driving or using machines while you are taking your medicine. Caring for your incision Follow instructions from your doctor about how to take care of your cut from surgery (incisions). Make sure you: Wash your hands with soap and water for at least 20  seconds before and after you change your bandage. If you cannot use soap and water, use hand sanitizer. Change your bandage. Leavestitches or skin glue in place for at least two weeks. Leave tape strips alone unless you are told to take them off. You may trim the edges of the tape strips if they curl up. Check your incision every day for signs of infection. Check for: Redness, swelling, or more pain. Fluid or blood. Warmth. Pus or a bad smell. Do not take baths, swim, or use a hot tub. Ask your doctor about taking showers or sponge baths. Activity Rest at home for 1-2 days, or as told by your doctor. Get up to take short walks every 1 to 2 hours. Ask for help if you feel weak or unsteady. Do not lift anything that is heavier than 10 lb (4.5 kg), or the limit that you are told. Do not play contact sports for 2 weeks after the procedure. Return to your normal activities as told by your doctor. Ask what activities are safe for you. General instructions Do not drink alcohol in the first week after the procedure. Plan to have a responsible adult care for you for the time you are told after you leave the hospital or clinic. This is important. It is up to you to get the results of your procedure. Ask how to get your results when they are ready. Keep all follow-up visits.   Contact a doctor if: You have more bleeding in your incision. Your incision swells, or is red and more painful. You have fluid that comes from your incision. You develop a rash. You have fever or chills. Get help right away if: You have swelling, bloating, or pain in your belly (abdomen). You get dizzy or faint. You vomit or you feel like vomiting. You have trouble breathing or feel short of breath. You have chest pain. You have problems talking or seeing. You have trouble with your balance or moving your arms or legs. These symptoms may be an emergency. Get help right away. Call your local emergency services (911 in  the [place] Do not wait to see if the symptoms will go away. Do not drive yourself to the hospital. Summary After the procedure, it is common to have pain, soreness, bruising, and tiredness. Your doctor will tell you how to take care of yourself at home. Change your bandage, take your medicines, and limit your activities as told by your doctor. Call your doctor if you have symptoms of infection. Get help right away if your belly swells, your cut bleeds a lot, or you have trouble talking or breathing. This information is not intended to replace advice given to you by your health care provider. Make sure you discuss any questions you have with your healthcare provider. Document Revised: 06/30/2020 Document Reviewed: 06/30/2020 Elsevier Patient Education  2022 [employer].

## 2022-09-21 NOTE — Procedures (Signed)
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 A total of 4 samples were obtained. Hemostasis of the tract was achieved using Gelfoam Slurry Embolization.  Plan: Bed rest for 2 hours.  See detailed procedure note with images in PACS. The patient tolerated the procedure well without incident or complication and was returned to Recovery in stable condition.    Michaelle Birks, MD Vascular [facility] Interventional Radiology [facility] Radiology   Pager. [place]

## 2022-09-21 NOTE — Consult Note (Signed)
Chief Complaint: Patient was seen in consultation today for image guided biopsy of liver lesion  Referring Physician(s): Iruku,Praveena  Supervising Physician: Michaelle Birks  Patient Status: [facility] - Out-pt  History of Present Illness: Krystal Brooks is a 67 y.o. female , ex smoker, with PMH sig for asthma, eczema, hypertension, left Adie's pupil, right breast cancer 2017 and left breast cancer in 2018 with prior bilateral mastectomies.  Recent PET scan on 09/10/2022 revealed:   1. Widespread hypermetabolic hepatic and osseous metastatic disease as described. 2. Indeterminate hypermetabolic activity in the subcarinal space which may relate to the mid esophagus or a subcarinal lymph node. 3. No evidence of breast/chest wall recurrence. 4. 2.8 cm infrarenal abdominal aortic aneurysm. Recommend follow-up every 5 years. Reference: J Am Coll Radiol 4944;96:759-163. 5. Aortic Atherosclerosis (ICD10-I70.0) and Emphysema   She presents today for image guided liver lesion biopsy for further evaluation.   Past Medical History:  Diagnosis Date   Adie's pupil    LEFT EYE   Allergy    Asthma    Atherosclerosis of aorta (HCC)    bulge above renal arteries on mri 2016   Cancer ([place]) 2017   right breast ca   Cellulitis    chest   Complication of anesthesia    VERY HARD TO WAKE UP     Eczema    Hypertension    Personal history of radiation therapy    PONV (postoperative nausea and vomiting)    Smoker     Past Surgical History:  Procedure Laterality Date   ABDOMINAL HYSTERECTOMY     BREAST BIOPSY Right 01/02/2016   BREAST EXCISIONAL BIOPSY Right 1979   BREAST IMPLANT REMOVAL Bilateral 10/05/2017   Procedure: REMOVALOF BILATERAL BREAST IMPLANTS;  Surgeon: Wallace Going, DO;  Location: [place];  Service: Plastics;  Laterality: Bilateral;   BREAST LUMPECTOMY Right 01/27/2016   BREAST LUMPECTOMY WITH RADIOACTIVE SEED AND SENTINEL LYMPH NODE BIOPSY Right  01/27/2016   Procedure: BREAST LUMPECTOMY WITH RADIOACTIVE SEED AND SENTINEL LYMPH NODE BIOPSY;  Surgeon: Excell Seltzer, MD;  Location: [place];  Service: General;  Laterality: Right;   BREAST RECONSTRUCTION WITH PLACEMENT OF TISSUE EXPANDER AND FLEX HD (ACELLULAR HYDRATED DERMIS) Bilateral 03/16/2017   Procedure: BILATERAL IMMEDIATE BREAST RECONSTRUCTION WITH PLACEMENT OF TISSUE EXPANDER AND FLEX HD (ACELLULAR HYDRATED DERMIS);  Surgeon: Wallace Going, DO;  Location: [place];  Service: Plastics;  Laterality: Bilateral;   BREAST SURGERY  1979   cyst removed rt breast   CATARACT EXTRACTION W/ INTRAOCULAR LENS IMPLANT     RT EYE   CESAREAN SECTION     x2   EXCISION OF BREAST LESION Left 01/30/2018   Procedure: EXCISION OF LEFT BREAST SCAR;  Surgeon: Wallace Going, DO;  Location: [place];  Service: Plastics;  Laterality: Left;   LATISSIMUS FLAP TO BREAST Right 01/30/2018   Procedure: RIGHT LATISSIMUS MYOCUTANEOUS FLAP FOR RIGHT CHEST WALL RECONSTRUCTION;  Surgeon: Wallace Going, DO;  Location: [place];  Service: Plastics;  Laterality: Right;   MASTECTOMY W/ SENTINEL NODE BIOPSY Bilateral 03/16/2017   Procedure: BILATERAL TOTAL MASTECTOMY WITH LEFT SENTINEL LYMPH NODE BIOPSY;  Surgeon: Excell Seltzer, MD;  Location: [place];  Service: General;  Laterality: Bilateral;   REMOVAL OF BILATERAL TISSUE EXPANDERS WITH PLACEMENT OF BILATERAL BREAST IMPLANTS Bilateral 06/15/2017   Procedure: REMOVAL OF BILATERAL TISSUE EXPANDERS WITH PLACEMENT OF BILATERAL BREAST IMPLANTS;  Surgeon: Wallace Going, DO;  Location: [place];  Service: Clinical cytogeneticist;  Laterality: Bilateral;   TOTAL MASTECTOMY Bilateral 03/16/2017   SENTINAL NODE BIOPSY    Allergies: Gabapentin, Penicillins, Sulfa antibiotics, and Levaquin [levofloxacin]  Medications: Prior to Admission medications   Medication Sig Start Date End Date Taking? Authorizing Provider  albuterol (VENTOLIN HFA)  108 (90 Base) MCG/ACT inhaler INHALE 1-2 PUFFS EVERY 6 HOURS AS NEEDED FOR WHEEZING. Patient taking differently: Inhale 1-2 puffs into the lungs every 6 (six) hours as needed for wheezing. INHALE 1-2 PUFFS EVERY 6 HOURS AS NEEDED FOR WHEEZING. 05/28/22  Yes Susy Frizzle, MD  anastrozole (ARIMIDEX) 1 MG tablet Take 1 tablet (1 mg total) by mouth daily. Patient taking differently: Take 1 mg by mouth at bedtime. 09/03/22  Yes Iruku, Arletha Pili, MD  escitalopram (LEXAPRO) 10 MG tablet Take 1 tablet (10 mg total) by mouth daily. Patient taking differently: Take 10 mg by mouth at bedtime. 09/03/22  Yes Benay Pike, MD  feeding supplement (ENSURE ENLIVE / ENSURE PLUS) LIQD Take 237 mLs by mouth 2 (two) times daily between meals. 09/12/22  Yes Lavina Hamman, MD  montelukast (SINGULAIR) 10 MG tablet Take 1 tablet (10 mg total) by mouth daily. Patient taking differently: Take 10 mg by mouth at bedtime. 03/03/22  Yes Susy Frizzle, MD  Multiple Vitamin (MULTIVITAMIN WITH MINERALS) TABS tablet Take 1 tablet by mouth daily. 09/12/22  Yes Lavina Hamman, MD  potassium chloride (KLOR-CON M10) 10 MEQ tablet Take 1 tablet (10 mEq total) by mouth daily. 09/12/22  Yes Lavina Hamman, MD  promethazine (PHENERGAN) 12.5 MG tablet Take 1 tablet (12.5 mg total) by mouth every 6 (six) hours as needed for nausea or vomiting. 09/12/22  Yes Lavina Hamman, MD  albuterol (PROVENTIL) (2.5 MG/3ML) 0.083% nebulizer solution TAKE 3 MLS (2.5 MG TOTAL) BY NEBULIZATION EVERY 6 HOURS AS NEEDED WHEEZING OR SHORTNESS OF BREATH. Patient taking differently: Take 2.5 mg by nebulization every 6 (six) hours as needed for wheezing or shortness of breath. 12/28/19   Annie Main, FNP  budesonide-formoterol (SYMBICORT) 80-4.5 MCG/ACT inhaler Inhale 2 puffs into the lungs 2 (two) times daily as needed ("for flares").    [provider]  fluticasone-salmeterol (ADVAIR HFA) 115-21 MCG/ACT inhaler Inhale 2 puffs into the lungs 2 (two)  times daily. Patient not taking: Reported on 09/11/2022 09/07/22   Susy Frizzle, MD  magnesium oxide (MAG-OX) 400 (240 Mg) MG tablet Take 1 tablet (400 mg total) by mouth 2 (two) times daily. 09/15/22   Benay Pike, MD  traMADol (ULTRAM) 50 MG tablet Take 1 tablet (50 mg total) by mouth every 8 (eight) hours as needed. Patient taking differently: Take 50 mg by mouth every 8 (eight) hours as needed (for pain). 09/03/22   Benay Pike, MD     Family History  Problem Relation Age of Onset   Hearing loss Mother    Asthma Father    Cancer Father        melanoma, metastases    Diabetes Father    Heart disease Father    Early death Sister    Early death Paternal Grandfather    Cancer Maternal Grandmother 88       colon cancer    Breast cancer Neg Hx    Ovarian cancer Neg Hx     Social History   Socioeconomic History   Marital status: Married    Spouse name: Not on file   Number of children: Not on file   Years of education: Not  on file   Highest education level: Not on file  Occupational History   Not on file  Tobacco Use   Smoking status: Former    Packs/day: 0.50    Years: 40.00    Total pack years: 20.00    Types: Cigarettes   Smokeless tobacco: Never  Vaping Use   Vaping Use: Never used  Substance and Sexual Activity   Alcohol use: Yes    Alcohol/week: 0.0 standard drinks of alcohol    Comment: social   Drug use: No   Sexual activity: Yes    Birth control/protection: Surgical    Comment: hysterectomy  Other Topics Concern   Not on file  Social History Narrative   Three story home   Drinks caffeine   Right handed   Social Determinants of Health   Financial Resource Strain: Low Risk  (09/13/2022)   Overall Financial Resource Strain (CARDIA)    Difficulty of Paying Living Expenses: Not hard at all  Food Insecurity: No Food Insecurity (01/22/2022)   Hunger Vital Sign    Worried About Running Out of Food in the Last Year: Never true    Ran Out of Food in  the Last Year: Never true  Transportation Needs: No Transportation Needs (09/13/2022)   PRAPARE - Hydrologist (Medical): No    Lack of Transportation (Non-Medical): No  Physical Activity: Insufficiently Active (01/22/2022)   Exercise Vital Sign    Days of Exercise per Week: 3 days    Minutes of Exercise per Session: 30 min  Stress: No Stress Concern Present (01/22/2022)   [place]    Feeling of Stress : Not at all  Social Connections: [place] (01/22/2022)   Social Connection and Isolation Panel [NHANES]    Frequency of Communication with Friends and Family: More than three times a week    Frequency of Social Gatherings with Friends and Family: More than three times a week    Attends Religious Services: More than 4 times per year    Active Member of [employer] or Organizations: Yes    Attends Music therapist: More than 4 times per year    Marital Status: Married     Review of Systems denies fever, headache, chest pain, abdominal/back pain, or bleeding; does have anxiety, dyspnea with exertion, occasional cough, intermittent nausea /vomiting  Vital Signs: BP (!) 150/76 (BP Location: Left Arm) Comment: super nervous and has been off BP medicine for few days  Pulse 78   Temp 97.8 F (36.6 C) (Oral)   Resp 18   SpO2 97%      Physical Exam awake, alert.  Chest with distant breath sounds bilaterally.  Heart with regular rate and rhythm.  Abdomen soft, positive bowel sounds, nontender.  No lower extremity edema.  Imaging: US RENAL  Result Date: 09/10/2022 CLINICAL DATA:  Acute renal failure EXAM: RENAL / URINARY TRACT ULTRASOUND COMPLETE COMPARISON:  PET CT 09/10/2022 FINDINGS: Right Kidney: Renal measurements: 9.7 x 4.8 x 4.9 cm = volume: 118.4 mL. Echogenicity within normal limits. No mass or hydronephrosis visualized. Left Kidney: Renal measurements: 9.6 x 4.7 x 4.7 cm  = volume: 111.9 mL. Echogenicity within normal limits. No mass or hydronephrosis visualized. Bladder: Appears normal for degree of bladder distention. Other: Multiple hypoechoic liver masses consistent with metastatic disease. IMPRESSION: 1. Normal ultrasound appearance of the kidneys 2. Multiple liver masses consistent with metastatic disease. Electronically Signed   By: Maudie Mercury  Francoise Ceo M.D.   On: 09/10/2022 21:14   NM PET Image Initial (PI) Skull Base To Thigh (F-18 FDG)  Result Date: 09/10/2022 CLINICAL DATA:  Initial treatment strategy for metastatic carcinoma. History of lobular breast cancer. EXAM: NUCLEAR MEDICINE PET SKULL BASE TO THIGH TECHNIQUE: 5.99 mCi F-18 FDG was injected intravenously. Full-ring PET imaging was performed from the skull base to thigh after the radiotracer. CT data was obtained and used for attenuation correction and anatomic localization. Fasting blood glucose: 119 mg/dl COMPARISON:  None Available. FINDINGS: Mediastinal blood pool activity: SUV max 1.3 NECK: No hypermetabolic cervical lymph nodes are identified.Fairly symmetric activity within the lymphoid tissue of Waldeyer's ring is within physiologic limits.No suspicious activity identified within the pharyngeal mucosal space. Incidental CT findings: Bilateral carotid atherosclerosis. CHEST: Focal hypermetabolic activity in the subcarinal space may relate to a lymph node or the midesophagus (SUV max 4.5). No esophageal dilatation. No other hypermetabolic mediastinal, hilar, axillary or internal mammary lymph nodes. No hypermetabolic pulmonary activity or suspicious nodularity. Incidental CT findings: Mild centrilobular emphysema with scattered parenchymal scarring and patchy ground-glass opacities. There is a small hiatal hernia. Diffuse aortic atherosclerosis with lesser involvement of the great vessels and coronary arteries. No esophageal dilatation. ABDOMEN/PELVIS: Widespread hypermetabolic metastatic disease throughout the  liver. There is an approximately 3.5 cm lesion in the dome of the right hepatic lobe which has an SUV max of 6.8. There is a large lesion in the left lobe measuring up to 4.1 cm on image 120/2 with an SUV max of 8.5. No hypermetabolic activity identified within the spleen, pancreas or adrenal glands. There is no hypermetabolic nodal activity in the abdomen or pelvis. Incidental CT findings: Aortic and branch vessel atherosclerosis with a small infrarenal abdominal aortic aneurysm which measures up to 2.8 cm transverse on image 147/2. Previous hysterectomy. SKELETON: Widespread hypermetabolic osseous metastatic disease with multiple lytic and sclerotic lesions. For example, a sternal manubrial lesion has an SUV max of 4.6. Lesion within the T12 vertebral body has an SUV max of 7.5. Mild superior endplate compression deformities at T9 and T12 which may be pathologic. Mild proximal femoral involvement bilaterally without evidence of impending pathologic fracture. Incidental CT findings: No significant epidural tumor identified. There is a convex left lumbar scoliosis with multilevel spondylosis. Evidence of prior bilateral mastectomy without chest wall recurrence. IMPRESSION: 1. Widespread hypermetabolic hepatic and osseous metastatic disease as described. 2. Indeterminate hypermetabolic activity in the subcarinal space which may relate to the mid esophagus or a subcarinal lymph node. 3. No evidence of breast/chest wall recurrence. 4. 2.8 cm infrarenal abdominal aortic aneurysm. Recommend follow-up every 5 years. Reference: J Am Coll Radiol 6812;75:170-017. 5. Aortic Atherosclerosis (ICD10-I70.0) and Emphysema (ICD10-J43.9). Electronically Signed   By: Richardean Sale M.D.   On: 09/10/2022 15:11    Labs:  CBC: Recent Labs    09/10/22 1550 09/11/22 0837 09/15/22 1425 09/16/22 1122  WBC 10.9* 8.9 10.3 10.4  HGB 8.9* 8.4* 8.1* 7.8*  HCT 27.8* 26.4* 24.5* 24.3*  PLT 341 285 219 210    COAGS: No results  for input(s): "INR", "APTT" in the last 8760 hours.  BMP: Recent Labs    09/11/22 0837 09/12/22 0751 09/15/22 1425 09/16/22 1122  NA 131* 129* 133* 133*  K 4.1 3.6 3.4* 3.6  CL 98 102 95* 95*  CO2 24 21* 27 29  GLUCOSE 99 95 105* 110*  BUN 32* 26* 17 14  CALCIUM 8.3* 7.4* 8.0* 8.2*  CREATININE 2.60* 2.09* 1.26* 1.07*  GFRNONAA  20* 25* 47* 57*    LIVER FUNCTION TESTS: Recent Labs    09/11/22 0837 09/12/22 0751 09/15/22 1425 09/16/22 1122  BILITOT 0.5 0.4 0.4 0.4  AST 45* 32 64* 72*  ALT 53* 38 61* 78*  ALKPHOS 85 70 72 77  PROT 6.0* 5.6* 6.4* 6.0*  ALBUMIN 2.6* 2.4* 2.7* 2.9*    TUMOR MARKERS: No results for input(s): "AFPTM", "CEA", "CA199", "CHROMGRNA" in the last 8760 hours.  Assessment and Plan: 67 y.o. female , ex smoker, with PMH sig for asthma, eczema, hypertension, left Adie's pupil, right breast cancer 2017 and left breast cancer in 2018 with prior bilateral mastectomies.  Recent PET scan on 09/10/2022 revealed:   1. Widespread hypermetabolic hepatic and osseous metastatic disease as described. 2. Indeterminate hypermetabolic activity in the subcarinal space which may relate to the mid esophagus or a subcarinal lymph node. 3. No evidence of breast/chest wall recurrence. 4. 2.8 cm infrarenal abdominal aortic aneurysm. Recommend follow-up every 5 years. Reference: J Am Coll Radiol 3235;57:322-025. 5. Aortic Atherosclerosis (ICD10-I70.0) and Emphysema   She presents today for image guided liver lesion biopsy for further evaluation.Risks and benefits of procedure was discussed with the patient including, but not limited to bleeding, infection, damage to adjacent structures or low yield requiring additional tests.  All of the questions were answered and there is agreement to proceed.  Consent signed and in chart.  LABS PENDING  Thank you for this interesting consult.  I greatly enjoyed meeting Krystal Brooks and look forward to participating in their care.   A copy of this report was sent to the requesting provider on this date.  Electronically Signed: D. Rowe Robert, PA-C 09/21/2022, 11:27 AM   I spent a total of  25 minutes   in face to face in clinical consultation, greater than 50% of which was counseling/coordinating care for image guided liver lesion biopsy

## 2022-09-22 ENCOUNTER — Encounter (HOSPITAL_COMMUNITY): Payer: Medicare HMO

## 2022-09-24 ENCOUNTER — Inpatient Hospital Stay (HOSPITAL_BASED_OUTPATIENT_CLINIC_OR_DEPARTMENT_OTHER): Payer: Medicare HMO | Admitting: Hematology and Oncology

## 2022-09-24 ENCOUNTER — Encounter: Payer: Self-pay | Admitting: Hematology and Oncology

## 2022-09-24 ENCOUNTER — Telehealth: Payer: Self-pay | Admitting: *Deleted

## 2022-09-24 DIAGNOSIS — C50212 Malignant neoplasm of upper-inner quadrant of left female breast: Secondary | ICD-10-CM

## 2022-09-24 DIAGNOSIS — Z170 Estrogen receptor positive status [ER+]: Secondary | ICD-10-CM | POA: Diagnosis not present

## 2022-09-24 NOTE — Telephone Encounter (Signed)
Per Dr.Iruku, faxed referral to [facility] office with receipt of delivery to [phone]

## 2022-09-24 NOTE — Progress Notes (Signed)
Krystal Brooks  [phone] Fax:([phone]     ID: ANGELEA Brooks DOB: 1956-08-25  MR#: [id number]  UQJ#:[id number]  Patient Care Team: Susy Frizzle, MD as PCP - General (Family Medicine) Excell Seltzer, MD (Inactive) as Consulting Physician (General Surgery) Druscilla Brownie, MD as Consulting Physician (Dermatology) Dillingham, [name], DO as Attending Physician (Plastic Surgery) Delice Bison Charlestine Massed, NP as Nurse Practitioner (Hematology and Oncology) Krystal Brooks, [facility] as Pharmacist (Pharmacist) Krystal Pike, MD as Medical Oncologist (Hematology and Oncology)  CHIEF COMPLAINT: Recurrent estrogen receptor positive breast cancer (s/p bilateral mastectomies)  CURRENT TREATMENT: Anastrozole  INTERVAL HISTORY:  She is here for a telephone visit to review biopsy results. Rest of the pertinent 10 point ROS reviewed and negative.   COVID 19 VACCINATION STATUS: [place] x3, last 05/2020; infection 02/2021   BREAST CANCER HISTORY: From Dr Ernestina Penna 01/14/16 note:   "Krystal Brooks 67 y.o. female is here because of Her newly diagnosed right breast cancer. She is accompanied by her husband and of friend to our multidisciplinary rest clinic today.   This was found by screening mammogram,  she denies any palpable mass, skin change or nipple discharge, or ulcer constitutional symptoms before the screening.  Her prior mammo 6 years ago. She had a right breast cyst removed in 1979. "   Malignant neoplasm of lower-inner quadrant of right breast of female, estrogen receptor positive ([place])    12/29/2015 Mammogram      Diagnostic right mammogram showed a 2.3 x 2.1 x 2.2 cm group of coarse calcification within the medial slightly lower right breast.         01/02/2016 Initial Diagnosis      Breast cancer of lower-inner quadrant of right female breast ([place])         01/02/2016 Initial Biopsy      right breast LIQ core needle biopsy showed LCIS, with a small  focus of invasive lobular carcinoma, grade 2.          01/02/2016 Receptors her2      ER 10% positive, moderate staining. PR negative. Insufficient tissue for HER-2 testing.         01/27/2016 Surgery      Right breast lumpectomy and sentinel lymph node biopsy ([place])         01/27/2016 Pathology Results      Right breast lumpectomy showed pleomorphic variant of lobular carcinoma in situ, and scattered microscopic foci of invasive lobular carcinoma (all less than 0.1 cm) in different sections, total 0.6 cm. 0/2 SLN.          01/27/2016 Receptors her2      The surgical sample of the invasive carcinoma was not sufficient for Her2 test and other additional studies.         03/09/2016 - 04/19/2016 Radiation Therapy      Adjuvant breast radiation Krystal Brooks). Right breast: 50 Gy in 25 fractions. Right breast "boost": 10 Gy in 5 fractions.           04/2016 -  Anti-estrogen oral therapy      Anastrozole 1 mg daily. Planned duration of therapy: 5 years        PAST MEDICAL HISTORY: Past Medical History:  Diagnosis Date   Adie's pupil    LEFT EYE   Allergy    Asthma    Atherosclerosis of aorta (HCC)    bulge above renal arteries on mri 2016   Cancer ([place]) 2017   right breast ca  Cellulitis    chest   Complication of anesthesia    VERY HARD TO WAKE UP     Eczema    Hypertension    Personal history of radiation therapy    PONV (postoperative nausea and vomiting)    Smoker     PAST SURGICAL HISTORY: Past Surgical History:  Procedure Laterality Date   ABDOMINAL HYSTERECTOMY     BREAST BIOPSY Right 01/02/2016   BREAST EXCISIONAL BIOPSY Right 1979   BREAST IMPLANT REMOVAL Bilateral 10/05/2017   Procedure: REMOVALOF BILATERAL BREAST IMPLANTS;  Surgeon: Wallace Going, DO;  Location: [place];  Service: Plastics;  Laterality: Bilateral;   BREAST LUMPECTOMY Right 01/27/2016   BREAST LUMPECTOMY WITH RADIOACTIVE SEED AND SENTINEL LYMPH NODE BIOPSY Right 01/27/2016    Procedure: BREAST LUMPECTOMY WITH RADIOACTIVE SEED AND SENTINEL LYMPH NODE BIOPSY;  Surgeon: Excell Seltzer, MD;  Location: [place];  Service: General;  Laterality: Right;   BREAST RECONSTRUCTION WITH PLACEMENT OF TISSUE EXPANDER AND FLEX HD (ACELLULAR HYDRATED DERMIS) Bilateral 03/16/2017   Procedure: BILATERAL IMMEDIATE BREAST RECONSTRUCTION WITH PLACEMENT OF TISSUE EXPANDER AND FLEX HD (ACELLULAR HYDRATED DERMIS);  Surgeon: Wallace Going, DO;  Location: [place];  Service: Plastics;  Laterality: Bilateral;   BREAST SURGERY  1979   cyst removed rt breast   CATARACT EXTRACTION W/ INTRAOCULAR LENS IMPLANT     RT EYE   CESAREAN SECTION     x2   EXCISION OF BREAST LESION Left 01/30/2018   Procedure: EXCISION OF LEFT BREAST SCAR;  Surgeon: Wallace Going, DO;  Location: [place];  Service: Plastics;  Laterality: Left;   LATISSIMUS FLAP TO BREAST Right 01/30/2018   Procedure: RIGHT LATISSIMUS MYOCUTANEOUS FLAP FOR RIGHT CHEST WALL RECONSTRUCTION;  Surgeon: Wallace Going, DO;  Location: [place];  Service: Plastics;  Laterality: Right;   MASTECTOMY W/ SENTINEL NODE BIOPSY Bilateral 03/16/2017   Procedure: BILATERAL TOTAL MASTECTOMY WITH LEFT SENTINEL LYMPH NODE BIOPSY;  Surgeon: Excell Seltzer, MD;  Location: [place];  Service: General;  Laterality: Bilateral;   REMOVAL OF BILATERAL TISSUE EXPANDERS WITH PLACEMENT OF BILATERAL BREAST IMPLANTS Bilateral 06/15/2017   Procedure: REMOVAL OF BILATERAL TISSUE EXPANDERS WITH PLACEMENT OF BILATERAL BREAST IMPLANTS;  Surgeon: Wallace Going, DO;  Location: [place];  Service: Plastics;  Laterality: Bilateral;   TOTAL MASTECTOMY Bilateral 03/16/2017   SENTINAL NODE BIOPSY    FAMILY HISTORY Family History  Problem Relation Age of Onset   Hearing loss Mother    Asthma Father    Cancer Father        melanoma, metastases    Diabetes Father    Heart disease Father    Early death Sister    Early death  Paternal Grandfather    Cancer Maternal Grandmother 88       colon cancer    Breast cancer Neg Hx    Ovarian cancer Neg Hx   The patient's father died at age 43. He had been diagnosed with melanoma in 1 year. That was not the cause of death. The patient's mother is living at age 90 as of May 2018. The patient has one brother, no sisters. There is no history of breast or ovarian cancer in the family, and no other melanoma history.  GYNECOLOGIC HISTORY:  No LMP recorded. Patient has had a hysterectomy. 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 She frequently keeps her 2 grandchildren. Her husband Clair Gulling is a retired Airline pilot. He now owns a business where they prepare the connection between the front living in/RV portion and a horse trailer: They do the hitch combination. Daughter April lives in [place] where she works as a Statistician. Daughter Ailene Ravel lives in [place] where she is an Forensic scientist for [employer]. The patient has 4 grandchildren. She is a Tourist information centre manager.    ADVANCED DIRECTIVES: The patient's husband is automatically her healthcare power of attorney   HEALTH MAINTENANCE: Social History   Tobacco Use   Smoking status: Former    Packs/day: 0.50    Years: 40.00    Total pack years: 20.00    Types: Cigarettes   Smokeless tobacco: Never  Vaping Use   Vaping Use: Never used  Substance Use Topics   Alcohol use: Yes    Alcohol/week: 0.0 standard drinks of alcohol    Comment: social   Drug use: No     Colonoscopy:  PAP:  Bone density:05/24/2016 showed a T score of -0.6 normal   Allergies  Allergen Reactions   Gabapentin Other (See Comments)    Pt states sedates her   Penicillins Anaphylaxis, Swelling, Rash and Other (See Comments)    Older sister died  from reaction to penicillin Has patient had a PCN reaction causing immediate rash, facial/tongue/throat swelling, SOB or lightheadedness with hypotension: Yes Has patient had a PCN reaction causing severe rash involving mucus membranes or skin necrosis: No Has patient had a PCN reaction that required hospitalization: No Has patient had a PCN reaction occurring within the last 10 years: No If all of the above answers are "NO", then may proceed with Cephalosporin use.    Sulfa Antibiotics Rash   Levaquin [Levofloxacin] Nausea And Vomiting    Current Outpatient Medications  Medication Sig Dispense Refill   albuterol (PROVENTIL) (2.5 MG/3ML) 0.083% nebulizer solution TAKE 3 MLS (2.5 MG TOTAL) BY NEBULIZATION EVERY 6 HOURS AS NEEDED WHEEZING OR SHORTNESS OF BREATH. (Patient taking differently: Take 2.5 mg by nebulization every 6 (six) hours as needed for wheezing or shortness of breath.) 300 mL 5   albuterol (VENTOLIN HFA) 108 (90 Base) MCG/ACT inhaler INHALE 1-2 PUFFS EVERY 6 HOURS AS NEEDED FOR WHEEZING. (Patient taking differently: Inhale 1-2 puffs into the lungs every 6 (six) hours as needed for wheezing. INHALE 1-2 PUFFS EVERY 6 HOURS AS NEEDED FOR WHEEZING.) 18 each 5   anastrozole (ARIMIDEX) 1 MG tablet Take 1 tablet (1 mg total) by mouth daily. (Patient taking differently: Take 1 mg by mouth at bedtime.) 90 tablet 3   budesonide-formoterol (SYMBICORT) 80-4.5 MCG/ACT inhaler Inhale 2 puffs into the lungs 2 (two) times daily as needed ("for flares").     escitalopram (LEXAPRO) 10 MG tablet Take 1 tablet (10 mg total) by mouth daily. (Patient taking differently: Take 10 mg by mouth at bedtime.) 30 tablet 0   feeding supplement (ENSURE ENLIVE / ENSURE PLUS) LIQD Take 237 mLs by mouth 2 (two) times daily between meals. 10000 mL 0   fluticasone-salmeterol (ADVAIR HFA) 115-21 MCG/ACT inhaler Inhale 2 puffs into the lungs 2 (two) times daily. (Patient not taking: Reported on 09/11/2022) 1 each 12    magnesium oxide (MAG-OX) 400 (240 Mg) MG tablet Take 1 tablet (400 mg total) by mouth 2 (two) times daily. 6 tablet 0   montelukast (  SINGULAIR) 10 MG tablet Take 1 tablet (10 mg total) by mouth daily. (Patient taking differently: Take 10 mg by mouth at bedtime.) 30 tablet 11   Multiple Vitamin (MULTIVITAMIN WITH MINERALS) TABS tablet Take 1 tablet by mouth daily. 60 tablet 0   potassium chloride (KLOR-CON M10) 10 MEQ tablet Take 1 tablet (10 mEq total) by mouth daily. 30 tablet 1   promethazine (PHENERGAN) 12.5 MG tablet Take 1 tablet (12.5 mg total) by mouth every 6 (six) hours as needed for nausea or vomiting. 20 tablet 0   traMADol (ULTRAM) 50 MG tablet Take 1 tablet (50 mg total) by mouth every 8 (eight) hours as needed. (Patient taking differently: Take 50 mg by mouth every 8 (eight) hours as needed (for pain).) 45 tablet 0   No current facility-administered medications for this visit.    OBJECTIVE: White woman in no acute distress  There were no vitals filed for this visit.     There is no height or weight on file to calculate BMI.    ECOG FS:1 - Symptomatic but completely ambulatory  PE deferred in lieu of telephone visit  LAB RESULTS:  CMP     Component Value Date/Time   NA 133 (L) 09/16/2022 1122   NA 137 07/13/2017 1035   K 3.6 09/16/2022 1122   K 4.3 07/13/2017 1035   CL 95 (L) 09/16/2022 1122   CO2 29 09/16/2022 1122   CO2 27 07/13/2017 1035   GLUCOSE 110 (H) 09/16/2022 1122   GLUCOSE 94 07/13/2017 1035   BUN 14 09/16/2022 1122   BUN 15.4 07/13/2017 1035   CREATININE 1.07 (H) 09/16/2022 1122   CREATININE 1.26 (H) 09/15/2022 1425   CREATININE 1.02 05/11/2022 0814   CREATININE 0.8 07/13/2017 1035   CALCIUM 8.2 (L) 09/16/2022 1122   CALCIUM 9.3 07/13/2017 1035   PROT 6.0 (L) 09/16/2022 1122   PROT 7.7 07/13/2017 1035   ALBUMIN 2.9 (L) 09/16/2022 1122   ALBUMIN 3.5 07/13/2017 1035   AST 72 (H) 09/16/2022 1122   AST 64 (H) 09/15/2022 1425   AST 15 07/13/2017 1035    ALT 78 (H) 09/16/2022 1122   ALT 61 (H) 09/15/2022 1425   ALT 14 07/13/2017 1035   ALKPHOS 77 09/16/2022 1122   ALKPHOS 73 07/13/2017 1035   BILITOT 0.4 09/16/2022 1122   BILITOT 0.4 09/15/2022 1425   BILITOT 0.22 07/13/2017 1035   GFRNONAA 57 (L) 09/16/2022 1122   GFRNONAA 47 (L) 09/15/2022 1425   GFRNONAA 64 12/15/2017 1541   GFRAA >60 06/06/2019 1102   GFRAA 50 (L) 06/20/2018 0930   GFRAA 74 12/15/2017 1541    No results found for: "TOTALPROTELP", "ALBUMINELP", "A1GS", "A2GS", "BETS", "BETA2SER", "GAMS", "MSPIKE", "SPEI"  No results found for: "KPAFRELGTCHN", "LAMBDASER", "KAPLAMBRATIO"  Lab Results  Component Value Date   WBC 11.1 (H) 09/21/2022   NEUTROABS 8.0 (H) 09/16/2022   HGB 10.5 (L) 09/21/2022   HCT 32.3 (L) 09/21/2022   MCV 87.1 09/21/2022   PLT 182 09/21/2022      Chemistry      Component Value Date/Time   NA 133 (L) 09/16/2022 1122   NA 137 07/13/2017 1035   K 3.6 09/16/2022 1122   K 4.3 07/13/2017 1035   CL 95 (L) 09/16/2022 1122   CO2 29 09/16/2022 1122   CO2 27 07/13/2017 1035   BUN 14 09/16/2022 1122   BUN 15.4 07/13/2017 1035   CREATININE 1.07 (H) 09/16/2022 1122   CREATININE 1.26 (H) 09/15/2022 1425  CREATININE 1.02 05/11/2022 0814   CREATININE 0.8 07/13/2017 1035      Component Value Date/Time   CALCIUM 8.2 (L) 09/16/2022 1122   CALCIUM 9.3 07/13/2017 1035   ALKPHOS 77 09/16/2022 1122   ALKPHOS 73 07/13/2017 1035   AST 72 (H) 09/16/2022 1122   AST 64 (H) 09/15/2022 1425   AST 15 07/13/2017 1035   ALT 78 (H) 09/16/2022 1122   ALT 61 (H) 09/15/2022 1425   ALT 14 07/13/2017 1035   BILITOT 0.4 09/16/2022 1122   BILITOT 0.4 09/15/2022 1425   BILITOT 0.22 07/13/2017 1035       No results found for: "LABCA2"  No components found for: "LABCAN125"  Recent Labs  Lab 09/21/22 1142  INR 1.2    Urinalysis    Component Value Date/Time   COLORURINE YELLOW 09/10/2022 2131   APPEARANCEUR HAZY (A) 09/10/2022 2131   LABSPEC 1.010  09/10/2022 2131   PHURINE 5.0 09/10/2022 2131   GLUCOSEU NEGATIVE 09/10/2022 2131   HGBUR NEGATIVE 09/10/2022 2131   BILIRUBINUR NEGATIVE 09/10/2022 2131   [place] 09/10/2022 2131   PROTEINUR NEGATIVE 09/10/2022 2131   NITRITE NEGATIVE 09/10/2022 2131   LEUKOCYTESUR NEGATIVE 09/10/2022 2131     STUDIES: [place] CORE BIOPSY (LIVER)  Result Date: 09/21/2022 INDICATION: History of breast cancer. Hepatic masses, suspicious for metastases. EXAM: ULTRASOUND GUIDED LIVER MASS BIOPSY COMPARISON:  PET-CT, 09/10/2022. MEDICATIONS: Diphenhydramine 25 mg IV. ANESTHESIA/SEDATION: Moderate (conscious) sedation was employed during this procedure. A total of Versed 2 mg and Fentanyl 100 mcg was administered intravenously. Moderate Sedation Time: 23 minutes. The patient's level of consciousness and vital signs were monitored continuously by radiology nursing throughout the procedure under my direct supervision. COMPLICATIONS: None immediate. PROCEDURE: Informed written consent was obtained from the patient after a discussion of the risks, benefits and alternatives to treatment. The patient understands and consents the procedure. A timeout was performed prior to the initiation of the procedure. Ultrasound scanning was performed of the epigastrium demonstrates prominent LEFT hepatic lobe mass The LEFT hepatic lobe mass was selected for biopsy and the procedure was planned. The right upper abdominal quadrant was prepped and draped in the usual sterile fashion. The overlying soft tissues were anesthetized with 1% lidocaine with epinephrine. A 17 gauge, 6.8 cm co-axial needle was advanced into a peripheral aspect of the lesion. This was followed by 4 core biopsies with an 18 gauge core device under direct ultrasound guidance. The coaxial needle tract was embolized with a small amount of Gel-Foam slurry and superficial hemostasis was obtained with manual compression. Post procedural scanning was negative for  definitive area of hemorrhage or additional complication. A dressing was placed. The patient tolerated the procedure well without immediate post procedural complication. IMPRESSION: Successful ultrasound guided core needle biopsy of liver mass, as above. Michaelle Birks, MD [facility] Electronically Signed   By: Michaelle Birks M.D.   On: 09/21/2022 14:24   US RENAL  Result Date: 09/10/2022 CLINICAL DATA:  Acute renal failure EXAM: RENAL / URINARY TRACT ULTRASOUND COMPLETE COMPARISON:  PET CT 09/10/2022 FINDINGS: Right Kidney: Renal measurements: 9.7 x 4.8 x 4.9 cm = volume: 118.4 mL. Echogenicity within normal limits. No mass or hydronephrosis visualized. Left Kidney: Renal measurements: 9.6 x 4.7 x 4.7 cm = volume: 111.9 mL. Echogenicity within normal limits. No mass or hydronephrosis visualized. Bladder: Appears normal for degree of bladder distention. Other: Multiple hypoechoic liver masses consistent with metastatic disease. IMPRESSION: 1. Normal ultrasound appearance of  the kidneys 2. Multiple liver masses consistent with metastatic disease. Electronically Signed   By: Donavan Foil M.D.   On: 09/10/2022 21:14   NM PET Image Initial (PI) Skull Base To Thigh (F-18 FDG)  Result Date: 09/10/2022 CLINICAL DATA:  Initial treatment strategy for metastatic carcinoma. History of lobular breast cancer. EXAM: NUCLEAR MEDICINE PET SKULL BASE TO THIGH TECHNIQUE: 5.99 mCi F-18 FDG was injected intravenously. Full-ring PET imaging was performed from the skull base to thigh after the radiotracer. CT data was obtained and used for attenuation correction and anatomic localization. Fasting blood glucose: 119 mg/dl COMPARISON:  None Available. FINDINGS: Mediastinal blood pool activity: SUV max 1.3 NECK: No hypermetabolic cervical lymph nodes are identified.Fairly symmetric activity within the lymphoid tissue of Waldeyer's ring is within physiologic limits.No suspicious  activity identified within the pharyngeal mucosal space. Incidental CT findings: Bilateral carotid atherosclerosis. CHEST: Focal hypermetabolic activity in the subcarinal space may relate to a lymph node or the midesophagus (SUV max 4.5). No esophageal dilatation. No other hypermetabolic mediastinal, hilar, axillary or internal mammary lymph nodes. No hypermetabolic pulmonary activity or suspicious nodularity. Incidental CT findings: Mild centrilobular emphysema with scattered parenchymal scarring and patchy ground-glass opacities. There is a small hiatal hernia. Diffuse aortic atherosclerosis with lesser involvement of the great vessels and coronary arteries. No esophageal dilatation. ABDOMEN/PELVIS: Widespread hypermetabolic metastatic disease throughout the liver. There is an approximately 3.5 cm lesion in the dome of the right hepatic lobe which has an SUV max of 6.8. There is a large lesion in the left lobe measuring up to 4.1 cm on image 120/2 with an SUV max of 8.5. No hypermetabolic activity identified within the spleen, pancreas or adrenal glands. There is no hypermetabolic nodal activity in the abdomen or pelvis. Incidental CT findings: Aortic and branch vessel atherosclerosis with a small infrarenal abdominal aortic aneurysm which measures up to 2.8 cm transverse on image 147/2. Previous hysterectomy. SKELETON: Widespread hypermetabolic osseous metastatic disease with multiple lytic and sclerotic lesions. For example, a sternal manubrial lesion has an SUV max of 4.6. Lesion within the T12 vertebral body has an SUV max of 7.5. Mild superior endplate compression deformities at T9 and T12 which may be pathologic. Mild proximal femoral involvement bilaterally without evidence of impending pathologic fracture. Incidental CT findings: No significant epidural tumor identified. There is a convex left lumbar scoliosis with multilevel spondylosis. Evidence of prior bilateral mastectomy without chest wall  recurrence. IMPRESSION: 1. Widespread hypermetabolic hepatic and osseous metastatic disease as described. 2. Indeterminate hypermetabolic activity in the subcarinal space which may relate to the mid esophagus or a subcarinal lymph node. 3. No evidence of breast/chest wall recurrence. 4. 2.8 cm infrarenal abdominal aortic aneurysm. Recommend follow-up every 5 years. Reference: J Am Coll Radiol 6387;56:433-295. 5. Aortic Atherosclerosis (ICD10-I70.0) and Emphysema (ICD10-J43.9). Electronically Signed   By: Richardean Sale M.D.   On: 09/10/2022 15:11     ELIGIBLE FOR AVAILABLE RESEARCH PROTOCOL:   ASSESSMENT: 67 y.o. [place] woman, with bilateral breast cancer   (1) status post right lumpectomy and sentinel lymph node sampling 01/27/2016 for lobular carcinoma in situ, pleomorphic variant, with multiple foci of invasive lobular carcinoma, all less than a millimeter, but with negative margins and both sentinel lymph nodes negative, invasive disease being estrogen receptor positive, HER-2 not tested (pT1 [mic] pN0}   (2) adjuvant radiation 03/09/2016 through 04/19/2016  The patient initially received a dose of 50 Gy in 25 fractions to the breast using whole-breast tangent fields. This was delivered using  a 3-D conformal technique. The patient then received a boost to the seroma. This delivered an additional 10 Gy in 5 fractions using an en face electron field due to the depth of the seroma. The total dose was 60 Gy.   (3) anastrozole started June 2017, discontinued July 2018   (4) status post left breast lower inner quadrant biopsy 12/29/2016 for a clinical T1c N0  invasive lobular breast cancer, grade 2, estrogen receptor positive, progesterone receptor negative HER-2 nonamplified, with a signals ratio 1.35 and the number per cell 2.70.  (5) intermediate Oncotype score of 22 predicts a 10 year risk of recurrence outside the breast of 14% if the patient's only systemic therapy is tamoxifen for 5  years.  (a) the patient opted against chemotherapy Lovena Le Rx results confirm choice)  (6) status post bilateral mastectomies and left sentinel lymph node sampling 03/16/2017 showing  (a) on the right, no evidence of carcinoma  (b) on the left, a pT2 pN0, stage IIA invasive lobular carcinoma, grade 2, with negative margins.  (7) had immediate expander placement at the time of bilateral mastectomies  (a) definitive implant placement with bilateral capsulotomies June 15, 2017, with benign pathology  (b) implants removed February 2019 because of chest discomfort  (c) status post right latissimus flap reconstruction and left capsulotomy 01/30/2018 with benign pathology  (d) decided to have all implants removed with no further reconstruction.  (Per Dr. Dalbert Mayotte note 03/06/2019).  (8) started tamoxifen 04/30/2017-- plan for total 10 years of antiestrogens given lobular histology   PLAN:  #1 Since her last visit here, she had an MRI for worsening back pain which showed concern for diffuse osseous metastatic disease.  MRI indeed was concerning for metastatic cancer, likely metastatic breast cancer however we will need a biopsy to confirm this.  We have reviewed that invasive lobular carcinoma does have a propensity to metastasize to the bone.   She is here to review PET CT results. PET CT showed wide spread hypermetabolic hepatic and osseous met disease. Indeterminate hypermetabolic activity in subcarinal space. 2.8 cm AAA.  Liver biopsy confirmed metastatic lobular carcinoma, ER positive 100% strong staining, PR 1% positive, Her 2  2+ by IHC, Ki 67 30%.FISH pending. We did discuss that this is most likely going to be Her 2 negative like in the past. We did discuss treatment options if its indeed Her 2 neg. We discussed about xeloda vs Ribociclib and anastrozole.  Foundation one testing has also been sent on this specimen. I will also recommend genetic testing.  #2 for cancer related pain,  have sent tramadol 50 mg every 8 hours as needed for pain.   She denies any pain for the past 2 weeks and didn't have to take tramadol.  #3, normocytic normochromic anemia likely from marrow replacement. Today is a telephone visit, so no labs done.  #4 Dental clearance, she needs some extractions working with her oral surgeon to have this taken care of.   She wants to think about her options for treatment we discussed today. She will call back Monday.  We have discussed that metastatic cancer is incurable, we have several treatment options however we cannot cure the cancer. Treatment options will be dependent on repeat biopsy and findings. Referral sent to [facility], Dr Harriett Rush for second opinion. Patient has contacts here. Referral refaxed today per patient.  Total time spent: 20 minutes  I connected with  Krystal Brooks on 09/24/22 by a telephone application and verified that I  am speaking with the correct person using two identifiers.   I discussed the limitations of evaluation and management by telemedicine. The patient expressed understanding and agreed to proceed.   *Total Encounter Time as defined by the Centers for Medicare and Medicaid Services includes, in addition to the face-to-face time of a patient visit (documented in the note above) non-face-to-face time: obtaining and reviewing outside history, ordering and reviewing medications, tests or procedures, care coordination (communications with other health care professionals or caregivers) and documentation in the medical record.

## 2022-09-24 NOTE — Telephone Encounter (Signed)
[employer] ONE request sent over to [facility] with email confirmation.

## 2022-09-27 ENCOUNTER — Inpatient Hospital Stay: Payer: Medicare HMO | Admitting: Nutrition

## 2022-09-27 ENCOUNTER — Telehealth: Payer: Self-pay

## 2022-09-27 NOTE — Telephone Encounter (Signed)
Pt called and states she would like to proceed with PO chemo per MD discussion, and asks if possible for refill on promethazine as prescribed by MD inpatient. Message forwarded to Dr Chryl Heck to advise if she would refill Promethazine or prescribe alternative antiemetic.

## 2022-09-27 NOTE — Progress Notes (Signed)
67 year old female diagnosed with recurrent ER positive breast cancer status post double bilateral mastectomies.  She is followed by Dr. Chryl Heck who and is currently on Anastrozole.  Patient is deciding on further treatment options.  Past medical history includes cellulitis, hypertension, chemotherapy, radiation therapy and tobacco.  Medications include Lexapro, multivitamin, and Phenergan.  Labs include sodium 133, glucose 110, creatinine 1.07, albumin 2.9 on January 18.  Height: 5 feet 1 inch. Weight: 113.8 pounds. Usual body weight: 124 pounds September 2022. BMI: 21.50.  Patient reports daily nausea.  Phenergan does relieve her nausea however she is taking it reactively.  Reports Zofran was not helpful.  Noted previous admission for nausea and vomiting and AKI.  Patient reports an aversion to foods over the past 1 to 2 months.  She does not have a taste for meats or bland foods.  She actually prefers spicier food.  She complains of dry mouth.  States she is trying to drink 1 Ensure daily.  Nutrition diagnosis: Unintended weight loss related to metastatic cancer as evidenced by 4% weight loss in less than 2 weeks.  Intervention: Educated on importance of taking nausea medication on a regular basis to prevent nausea.  Encouraged her to discuss with MD. Reviewed strategies for eating with nausea and vomiting.  Provided nutrition fact sheet. Encourage smaller more frequent meals and snacks with adequate calories and protein and provided nutrition facts sheets. Recommended use Ensure complete or [employer] 1.4 for added calories and protein.  Provided samples and coupons. Educated on strategies for minimizing dry mouth and provided nutrition fact sheet. Increase fluids.  Monitoring, evaluation, goals: Patient will tolerate increased calories and protein to minimize weight loss.  Next visit: Patient will contact RD for questions or concerns.  **Disclaimer: This note was dictated with voice  recognition software. Similar sounding words can inadvertently be transcribed and this note may contain transcription errors which may not have been corrected upon publication of note.**

## 2022-09-28 ENCOUNTER — Other Ambulatory Visit: Payer: Self-pay

## 2022-09-28 ENCOUNTER — Telehealth: Payer: Self-pay | Admitting: *Deleted

## 2022-09-28 ENCOUNTER — Encounter: Payer: Self-pay | Admitting: Hematology and Oncology

## 2022-09-28 MED ORDER — PROMETHAZINE HCL 12.5 MG PO TABS: 12.5000 mg | ORAL_TABLET | Freq: Four times a day (QID) | ORAL | 0 refills | Status: AC | PRN

## 2022-09-28 NOTE — Telephone Encounter (Signed)
Called pt per MD to advise we are waiting for FISH to come back before starting tx. Advised per MD OK to refill promethazine if this is what is working for her. Sent to pt's preferred phx. She reports she fell at home yesterday after arriving home from nutritionist appt. She fell backward and landed on her tail bone. Mrs Stech reports it is sore, denies bruise and will alternate heat/ice per my instructions. She will also alternate tylenol/ibuprofen for pain relief. She knows to call if pain worsens.

## 2022-09-28 NOTE — Telephone Encounter (Signed)
Medication Prior Authorization Status  Processed CoverMyMeds KEY: [id number]  Approved Today  Per call to [facility] [employer] Case ID: [id number]   Effective 09/28/2022 through 08/30/2023.

## 2022-09-28 NOTE — Progress Notes (Signed)
Called [facility] pathology regarding HER 2 FISH results. Staff stated that because of HER 2 kits being on back order, the lab will not be resulted until 2/2.

## 2022-09-29 ENCOUNTER — Other Ambulatory Visit: Payer: Self-pay | Admitting: Hematology and Oncology

## 2022-09-29 ENCOUNTER — Telehealth: Payer: Self-pay

## 2022-09-29 DIAGNOSIS — C50911 Malignant neoplasm of unspecified site of right female breast: Secondary | ICD-10-CM | POA: Diagnosis not present

## 2022-09-29 DIAGNOSIS — C50919 Malignant neoplasm of unspecified site of unspecified female breast: Secondary | ICD-10-CM | POA: Insufficient documentation

## 2022-09-29 DIAGNOSIS — C50311 Malignant neoplasm of lower-inner quadrant of right female breast: Secondary | ICD-10-CM

## 2022-09-29 DIAGNOSIS — Z170 Estrogen receptor positive status [ER+]: Secondary | ICD-10-CM | POA: Diagnosis not present

## 2022-09-29 LAB — SURGICAL PATHOLOGY

## 2022-09-29 MED ORDER — PROCHLORPERAZINE MALEATE 10 MG PO TABS: 10.0000 mg | ORAL_TABLET | Freq: Four times a day (QID) | ORAL | 1 refills | Status: DC | PRN

## 2022-09-29 MED ORDER — CAPECITABINE 500 MG PO TABS: 1000.0000 mg/m2 | ORAL_TABLET | Freq: Two times a day (BID) | ORAL | 4 refills | Status: DC

## 2022-09-29 MED ORDER — ONDANSETRON HCL 8 MG PO TABS: 8.0000 mg | ORAL_TABLET | Freq: Three times a day (TID) | ORAL | 1 refills | Status: DC | PRN

## 2022-09-29 NOTE — Progress Notes (Signed)
I called [name] back Her 2 resulted negative, so we discussed starting on xeloda. She also reported a fall, hence ordered MRI brain. She will follow up with me on 2/19 at 8:45, pt aware of this appointment.  Krystal Brooks

## 2022-09-29 NOTE — Progress Notes (Signed)
START ON PATHWAY REGIMEN - Breast     A cycle is every 21 days:     Capecitabine   **Always confirm dose/schedule in your pharmacy ordering system**  Patient Characteristics: Distant Metastases or Locoregional Recurrent Disease - Unresected or Locally Advanced Unresectable Disease Progressing after Neoadjuvant and Local Therapies, HER2 Low/Negative, ER Positive, Chemotherapy, HER2 Negative, First Line Therapeutic Status: Distant Metastases HER2 Status: Negative (-) ER Status: Positive (+) PR Status: Positive (+) Therapy Approach Indicated: Standard Chemotherapy/Endocrine Therapy Line of Therapy: First Line Intent of Therapy: Non-Curative / Palliative Intent, Discussed with Patient

## 2022-09-29 NOTE — Telephone Encounter (Signed)
Received medical records request from [place] with [facility]. Faxed all appropriate documents and received fax conf.

## 2022-09-30 ENCOUNTER — Encounter: Payer: Self-pay | Admitting: Hematology and Oncology

## 2022-09-30 ENCOUNTER — Other Ambulatory Visit (HOSPITAL_COMMUNITY): Payer: Self-pay

## 2022-09-30 ENCOUNTER — Telehealth: Payer: Self-pay

## 2022-09-30 ENCOUNTER — Other Ambulatory Visit: Payer: Self-pay

## 2022-09-30 ENCOUNTER — Telehealth: Payer: Self-pay | Admitting: Pharmacy Technician

## 2022-09-30 DIAGNOSIS — C50311 Malignant neoplasm of lower-inner quadrant of right female breast: Secondary | ICD-10-CM

## 2022-09-30 NOTE — Telephone Encounter (Addendum)
Oral Oncology Pharmacist Encounter  Received new prescription for capecitabine (Xeloda) for the treatment of recurrent ER positive, HER2 negative breast cancer, planned duration until disease progression or unacceptable toxicity.  Labs from 09/16/22 assessed, creatinine slightly elevated resulting in CrCl of 41.56 which would lead to a dose reduction of the xeloda to 75% of the dose. Waiting on response from MD prior to [facility].   Prescription decreased to 75% of the dose per MD. MD cosigned new prescription for lower dose. Prescription dose and frequency assessed for appropriateness.  Current medication list in Epic reviewed, DDIs with Xeloda identified: - advair, ventolin, and lexapro can increase Qtc prolongation in addition to xeloda. Patient had last EKG on 09/10/22 showing qtc of 418. Will inform MD so patient is monitored for any increases in Qtc.  Evaluated chart and no patient barriers to medication adherence noted.   Patient agreement for treatment documented in MD note on 09/29/2022.  Prescription has been e-scribed to the [facility] for benefits analysis and approval.  Oral Oncology Clinic will continue to follow for insurance authorization, copayment issues, initial counseling and start date.  Drema Halon, PharmD Hematology/Oncology Clinical Pharmacist [facility] [phone] 09/30/2022 9:16 AM

## 2022-09-30 NOTE — Telephone Encounter (Signed)
Oral Oncology Patient Advocate Encounter  After completing a benefits investigation, prior authorization for Capecitabine is not required at this time through [facility].  Patient's copay is $492.36.     Krystal Brooks, CPhT-Adv Oncology Pharmacy Patient [place] Direct Number: [phone]  Fax: [phone]

## 2022-10-01 ENCOUNTER — Other Ambulatory Visit: Payer: Self-pay | Admitting: Hematology and Oncology

## 2022-10-01 ENCOUNTER — Other Ambulatory Visit: Payer: Self-pay

## 2022-10-01 ENCOUNTER — Other Ambulatory Visit (HOSPITAL_COMMUNITY): Payer: Self-pay

## 2022-10-01 MED ORDER — CAPECITABINE 500 MG PO TABS
750.0000 mg/m2 | ORAL_TABLET | Freq: Two times a day (BID) | ORAL | 4 refills | Status: DC
  Filled 2022-10-01: qty 84, 21d supply, fill #0

## 2022-10-01 MED ORDER — CAPECITABINE 500 MG PO TABS
750.0000 mg/m2 | ORAL_TABLET | Freq: Two times a day (BID) | ORAL | 4 refills | Status: DC
  Filled 2022-10-01: qty 56, 21d supply, fill #0
  Filled 2022-10-01: qty 56, 14d supply, fill #0
  Filled 2022-10-14: qty 56, 21d supply, fill #1
  Filled 2022-11-09: qty 56, 21d supply, fill #2
  Filled 2022-12-01: qty 56, 21d supply, fill #3
  Filled 2022-12-13: qty 56, 21d supply, fill #4

## 2022-10-01 NOTE — Telephone Encounter (Signed)
Oral Chemotherapy Pharmacist Encounter  I spoke with patient for overview of: Xeloda (capecitabine) for the  treatment of recurrent ER positive, HER2 negative breast cancer, planned duration until disease progression or unacceptable drug toxicity.  Counseled patient on administration, dosing, side effects, monitoring, drug-food interactions, safe handling, storage, and disposal.  Patient will take Xeloda '500mg'$  tablets, 2 tablets ('1000mg'$ ) by mouth in AM and 2 tabs ('1000mg'$ ) by mouth in PM, within 30 minutes of finishing meals, for 14 days on, 7 days off, repeated every 21 days.  Xeloda start date: 10/05/22   Adverse effects include but are not limited to: fatigue, decreased blood counts, GI upset, diarrhea, mouth sores, and hand-foot syndrome.  Patient has anti-emetic on hand and knows to take it if nausea develops.   Patient will obtain anti diarrheal and alert the office of 4 or more loose stools above baseline.  Reviewed with patient importance of keeping a medication schedule and plan for any missed doses. No barriers to medication adherence identified.  Medication reconciliation performed and medication/allergy list updated.  Patient will pick up medication from [facility].   Patient informed the pharmacy will reach out 5-7 days prior to needing next fill of Xeloda to coordinate continued medication acquisition to prevent break in therapy.  All questions answered.  Patient voiced understanding and appreciation.   Medication education handout placed in mail for patient. Patient knows to call the office with questions or concerns. Oral Chemotherapy Clinic phone number provided to patient.   Drema Halon, PharmD Hematology/Oncology Clinical Pharmacist [facility] [phone]

## 2022-10-04 ENCOUNTER — Encounter: Payer: Self-pay | Admitting: Nurse Practitioner

## 2022-10-04 ENCOUNTER — Other Ambulatory Visit: Payer: Self-pay

## 2022-10-04 ENCOUNTER — Inpatient Hospital Stay: Payer: Medicare HMO | Attending: Hematology and Oncology | Admitting: Nurse Practitioner

## 2022-10-04 VITALS — BP 134/61 | HR 92 | Temp 97.7°F | Resp 18 | Wt 113.5 lb

## 2022-10-04 DIAGNOSIS — C7951 Secondary malignant neoplasm of bone: Secondary | ICD-10-CM | POA: Insufficient documentation

## 2022-10-04 DIAGNOSIS — R630 Anorexia: Secondary | ICD-10-CM | POA: Diagnosis not present

## 2022-10-04 DIAGNOSIS — Z170 Estrogen receptor positive status [ER+]: Secondary | ICD-10-CM | POA: Insufficient documentation

## 2022-10-04 DIAGNOSIS — R530 Neoplastic (malignant) related fatigue: Secondary | ICD-10-CM

## 2022-10-04 DIAGNOSIS — C50312 Malignant neoplasm of lower-inner quadrant of left female breast: Secondary | ICD-10-CM | POA: Insufficient documentation

## 2022-10-04 DIAGNOSIS — Z87891 Personal history of nicotine dependence: Secondary | ICD-10-CM | POA: Insufficient documentation

## 2022-10-04 DIAGNOSIS — Z7189 Other specified counseling: Secondary | ICD-10-CM | POA: Diagnosis not present

## 2022-10-04 DIAGNOSIS — R110 Nausea: Secondary | ICD-10-CM | POA: Diagnosis not present

## 2022-10-04 DIAGNOSIS — C787 Secondary malignant neoplasm of liver and intrahepatic bile duct: Secondary | ICD-10-CM | POA: Insufficient documentation

## 2022-10-04 DIAGNOSIS — Z515 Encounter for palliative care: Secondary | ICD-10-CM | POA: Diagnosis not present

## 2022-10-04 DIAGNOSIS — D649 Anemia, unspecified: Secondary | ICD-10-CM | POA: Insufficient documentation

## 2022-10-04 DIAGNOSIS — Z79811 Long term (current) use of aromatase inhibitors: Secondary | ICD-10-CM | POA: Insufficient documentation

## 2022-10-04 DIAGNOSIS — G893 Neoplasm related pain (acute) (chronic): Secondary | ICD-10-CM | POA: Insufficient documentation

## 2022-10-04 DIAGNOSIS — Z9013 Acquired absence of bilateral breasts and nipples: Secondary | ICD-10-CM | POA: Insufficient documentation

## 2022-10-04 DIAGNOSIS — Z923 Personal history of irradiation: Secondary | ICD-10-CM | POA: Insufficient documentation

## 2022-10-04 DIAGNOSIS — Z90710 Acquired absence of both cervix and uterus: Secondary | ICD-10-CM | POA: Insufficient documentation

## 2022-10-04 DIAGNOSIS — C50311 Malignant neoplasm of lower-inner quadrant of right female breast: Secondary | ICD-10-CM | POA: Insufficient documentation

## 2022-10-04 DIAGNOSIS — Z79899 Other long term (current) drug therapy: Secondary | ICD-10-CM | POA: Insufficient documentation

## 2022-10-04 NOTE — Patient Instructions (Addendum)
-   continue to take phenergan in the morning and maybe at night if needed - start to take compazine around the clock (every 6 hours) today to get ahead of your nausea for when you start your chemo pill tomorrow, take around the clock for the first 3 days, after you can back down and take as needed - if needed you can alternate between (overlap) the compazine and phenergan for your nausea (if you take phenergan at 8am, compazine at 11am, phenergan at 2pm, etc.)  - do not take your blood pressure medications for now, we will monitor and see if we need to restart - try small frequent meals, and increase your protein intake to prevent weight loss - give [place] a call or MyChart message [place] if you need a refill or if your symptoms are not relieved. Office number is [phone]

## 2022-10-04 NOTE — Progress Notes (Signed)
[place]  [phone] [phone]   Name: [name] Date: 10/04/2022 MRN: [id number]  DOB: [birth year]  Patient Care Team: Susy Frizzle, MD as PCP - General (Family Medicine) Excell Seltzer, MD (Inactive) as Consulting Physician (General Surgery) Druscilla Brownie, MD as Consulting Physician (Dermatology) Dillingham, [name], DO as Attending Physician (Plastic Surgery) Gardenia Phlegm, NP as Nurse Practitioner (Hematology and Oncology) Edythe Clarity, [facility] as Pharmacist (Pharmacist) Benay Pike, MD as Medical Oncologist (Hematology and Oncology)    REASON FOR CONSULTATION: Krystal Brooks is a 67 y.o. female with oncologic medical history including recurrent estrogen receptor positive breast cancer s/p bilateral mastectomies and radiation (12/2015). 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 Palliative ask to see for symptom and pain management and goals of care.    SOCIAL HISTORY:     reports that she has quit smoking. Her smoking use included cigarettes. She has a 20.00 pack-year smoking history. She has never used smokeless tobacco. She reports current alcohol use. She reports that she does not use drugs.  ADVANCE DIRECTIVES:  None on file  CODE STATUS: Full code  PAST MEDICAL HISTORY: Past Medical History:  Diagnosis Date   Adie's pupil    LEFT EYE   Allergy    Asthma    Atherosclerosis of aorta (HCC)    bulge above renal arteries on mri 2016   Cancer ([place]) 2017   right breast ca   Cellulitis    chest   Complication of anesthesia    VERY HARD TO WAKE UP     Eczema    Hypertension    Personal history of radiation therapy    PONV (postoperative nausea and vomiting)    Smoker     PAST SURGICAL HISTORY:  Past Surgical History:  Procedure Laterality Date   ABDOMINAL HYSTERECTOMY     BREAST BIOPSY Right 01/02/2016   BREAST EXCISIONAL BIOPSY Right  1979   BREAST IMPLANT REMOVAL Bilateral 10/05/2017   Procedure: REMOVALOF BILATERAL BREAST IMPLANTS;  Surgeon: Wallace Going, DO;  Location: [place];  Service: Plastics;  Laterality: Bilateral;   BREAST LUMPECTOMY Right 01/27/2016   BREAST LUMPECTOMY WITH RADIOACTIVE SEED AND SENTINEL LYMPH NODE BIOPSY Right 01/27/2016   Procedure: BREAST LUMPECTOMY WITH RADIOACTIVE SEED AND SENTINEL LYMPH NODE BIOPSY;  Surgeon: Excell Seltzer, MD;  Location: [place];  Service: General;  Laterality: Right;   BREAST RECONSTRUCTION WITH PLACEMENT OF TISSUE EXPANDER AND FLEX HD (ACELLULAR HYDRATED DERMIS) Bilateral 03/16/2017   Procedure: BILATERAL IMMEDIATE BREAST RECONSTRUCTION WITH PLACEMENT OF TISSUE EXPANDER AND FLEX HD (ACELLULAR HYDRATED DERMIS);  Surgeon: Wallace Going, DO;  Location: [place];  Service: Plastics;  Laterality: Bilateral;   BREAST SURGERY  1979   cyst removed rt breast   CATARACT EXTRACTION W/ INTRAOCULAR LENS IMPLANT     RT EYE   CESAREAN SECTION     x2   EXCISION OF BREAST LESION Left 01/30/2018   Procedure: EXCISION OF LEFT BREAST SCAR;  Surgeon: Wallace Going, DO;  Location: [place];  Service: Plastics;  Laterality: Left;   LATISSIMUS FLAP TO BREAST Right 01/30/2018   Procedure: RIGHT LATISSIMUS MYOCUTANEOUS FLAP FOR RIGHT CHEST WALL RECONSTRUCTION;  Surgeon: Wallace Going, DO;  Location: [place];  Service: Plastics;  Laterality: Right;   MASTECTOMY W/ SENTINEL NODE BIOPSY Bilateral 03/16/2017   Procedure: BILATERAL TOTAL MASTECTOMY WITH LEFT SENTINEL LYMPH NODE BIOPSY;  Surgeon: Excell Seltzer,  Marland Kitchen, MD;  Location: [place];  Service: General;  Laterality: Bilateral;   REMOVAL OF BILATERAL TISSUE EXPANDERS WITH PLACEMENT OF BILATERAL BREAST IMPLANTS Bilateral 06/15/2017   Procedure: REMOVAL OF BILATERAL TISSUE EXPANDERS WITH PLACEMENT OF BILATERAL BREAST IMPLANTS;  Surgeon: Wallace Going, DO;  Location: [place];   Service: Plastics;  Laterality: Bilateral;   TOTAL MASTECTOMY Bilateral 03/16/2017   SENTINAL NODE BIOPSY    HEMATOLOGY/ONCOLOGY HISTORY:  Oncology History Overview Note  Breast cancer of lower-inner quadrant of right female breast [facility])   Staging form: Breast, AJCC 7th Edition     Clinical stage from 01/02/2016: Stage IA (T1a, N0, M0) - Signed by Truitt Merle, MD on 01/14/2016     Pathologic stage from 01/27/2016: Stage IA (T1b, N0, cM0) - Signed by Truitt Merle, MD on 03/07/2016        Malignant neoplasm of lower-inner quadrant of right breast of female, estrogen receptor positive ([place])  12/29/2015 Mammogram   Diagnostic right mammogram showed a 2.3 x 2.1 x 2.2 cm group of coarse calcification within the medial slightly lower right breast.   01/02/2016 Initial Diagnosis   Breast cancer of lower-inner quadrant of right female breast ([place])   01/02/2016 Initial Biopsy   right breast LIQ core needle biopsy showed LCIS, with a small focus of invasive lobular carcinoma, grade 2.    01/02/2016 Receptors her2   ER 10% positive, moderate staining. PR negative. Insufficient tissue for HER-2 testing.   01/27/2016 Surgery   Right breast lumpectomy and sentinel lymph node biopsy ([place])   01/27/2016 Pathology Results   Right breast lumpectomy showed pleomorphic variant of lobular carcinoma in situ, and scattered microscopic foci of invasive lobular carcinoma (all less than 0.1 cm) in different sections, total 0.6 cm. 0/2 SLN.    01/27/2016 Receptors her2   The surgical sample of the invasive carcinoma was not sufficient for Her2 test and other additional studies.   03/09/2016 - 04/19/2016 Radiation Therapy   Adjuvant breast radiation [name]). Right breast: 50 Gy in 25 fractions. Right breast "boost": 10 Gy in 5 fractions.     04/2016 -  Anti-estrogen oral therapy   Anastrozole 1 mg daily. Planned duration of therapy: 5 years    05/24/2016 Imaging   DEXA scan: Normal (T-score -0.6)   06/21/2016 Imaging    MM and [place] Right Breast 06/21/2016 IMPRESSION: No evidence of malignancy within the right breast. Specifically, no evidence of malignancy within the outer right breast corresponding to the area of clinical concern. There are expected postsurgical changes within the inner right breast. RECOMMENDATION: Annual bilateral diagnostic mammograms. Next bilateral mammogram will be due in April of 2018.  Benign causes of breast pain, and possible remedies, were discussed with the patient. Patient was encouraged to follow-up with referring physician if pain became localized and persistent or if a palpable lump/mass developed.   09/29/2022 -  Chemotherapy   Patient is on Treatment Plan : BREAST Capecitabine q21d     Malignant neoplasm of upper-inner quadrant of left breast in female, estrogen receptor positive ([place])  12/29/2016 Initial Biopsy   ILC, grade2, ER+(100%), PR-, Ki-67 15%, HER-2 negative (ratio 1.35).     12/29/2016 Oncotype testing   Intermediate, 22/14%   01/03/2017 Initial Diagnosis   Malignant neoplasm of upper-inner quadrant of left breast in female, estrogen receptor positive ([place])   03/16/2017 Surgery   Bilateral mastectomies ([facility]): Right, no malignancies, 2 SLN negative, Left: ILC, grade 2, 2.3cm, LCIS, margins negative, 1SLN negative, T2,  N0   04/2017 -  Anti-estrogen oral therapy   Tamoxifen daily     ALLERGIES:  is allergic to gabapentin, penicillins, sulfa antibiotics, and levaquin [levofloxacin].  MEDICATIONS:  Current Outpatient Medications  Medication Sig Dispense Refill   albuterol (PROVENTIL) (2.5 MG/3ML) 0.083% nebulizer solution TAKE 3 MLS (2.5 MG TOTAL) BY NEBULIZATION EVERY 6 HOURS AS NEEDED WHEEZING OR SHORTNESS OF BREATH. (Patient taking differently: Take 2.5 mg by nebulization every 6 (six) hours as needed for wheezing or shortness of breath.) 300 mL 5   albuterol (VENTOLIN HFA) 108 (90 Base) MCG/ACT inhaler INHALE 1-2 PUFFS EVERY 6 HOURS AS NEEDED FOR WHEEZING.  (Patient taking differently: Inhale 1-2 puffs into the lungs every 6 (six) hours as needed for wheezing. INHALE 1-2 PUFFS EVERY 6 HOURS AS NEEDED FOR WHEEZING.) 18 each 5   anastrozole (ARIMIDEX) 1 MG tablet Take 1 tablet (1 mg total) by mouth daily. (Patient taking differently: Take 1 mg by mouth at bedtime.) 90 tablet 3   budesonide-formoterol (SYMBICORT) 80-4.5 MCG/ACT inhaler Inhale 2 puffs into the lungs 2 (two) times daily as needed ("for flares").     capecitabine (XELODA) 500 MG tablet Take 2 tablets (1,000 mg total) by mouth 2 (two) times daily after a meal. Take on days 1-14. Repeat every 21 days. 56 tablet 4   escitalopram (LEXAPRO) 10 MG tablet TAKE 1 TABLET BY MOUTH EVERY DAY 30 tablet 0   feeding supplement (ENSURE ENLIVE / ENSURE PLUS) LIQD Take 237 mLs by mouth 2 (two) times daily between meals. 10000 mL 0   fluticasone-salmeterol (ADVAIR HFA) 115-21 MCG/ACT inhaler Inhale 2 puffs into the lungs 2 (two) times daily. (Patient not taking: Reported on 09/11/2022) 1 each 12   magnesium oxide (MAG-OX) 400 (240 Mg) MG tablet Take 1 tablet (400 mg total) by mouth 2 (two) times daily. 6 tablet 0   montelukast (SINGULAIR) 10 MG tablet Take 1 tablet (10 mg total) by mouth daily. (Patient taking differently: Take 10 mg by mouth at bedtime.) 30 tablet 11   Multiple Vitamin (MULTIVITAMIN WITH MINERALS) TABS tablet Take 1 tablet by mouth daily. 60 tablet 0   ondansetron (ZOFRAN) 8 MG tablet Take 1 tablet (8 mg total) by mouth every 8 (eight) hours as needed for nausea or vomiting. 30 tablet 1   potassium chloride (KLOR-CON M10) 10 MEQ tablet Take 1 tablet (10 mEq total) by mouth daily. 30 tablet 1   prochlorperazine (COMPAZINE) 10 MG tablet Take 1 tablet (10 mg total) by mouth every 6 (six) hours as needed for nausea or vomiting. 30 tablet 1   promethazine (PHENERGAN) 12.5 MG tablet Take 1 tablet (12.5 mg total) by mouth every 6 (six) hours as needed for nausea or vomiting. 90 tablet 0   traMADol  (ULTRAM) 50 MG tablet Take 1 tablet (50 mg total) by mouth every 8 (eight) hours as needed. (Patient taking differently: Take 50 mg by mouth every 8 (eight) hours as needed (for pain).) 45 tablet 0   No current facility-administered medications for this visit.    VITAL SIGNS: There were no vitals taken for this visit. There were no vitals filed for this visit.  Estimated body mass index is 21.5 kg/m as calculated from the following:   Height as of 09/15/22: '5\' 1"'$  (1.549 m).   Weight as of 09/27/22: 113 lb 12.8 oz (51.6 kg).  LABS: CBC:    Component Value Date/Time   WBC 11.1 (H) 09/21/2022 1142   HGB 10.5 (L) 09/21/2022 1142  HGB 9.7 (L) 07/14/2022 1141   HGB 11.4 (L) 07/13/2017 1035   HCT 32.3 (L) 09/21/2022 1142   HCT 29.8 (L) 07/28/2022 1030   HCT 34.4 (L) 07/13/2017 1035   PLT 182 09/21/2022 1142   PLT 200 07/14/2022 1141   PLT 210 07/13/2017 1035   MCV 87.1 09/21/2022 1142   MCV 85.1 07/13/2017 1035   NEUTROABS 8.0 (H) 09/16/2022 1122   NEUTROABS 4.4 07/13/2017 1035   LYMPHSABS 1.3 09/16/2022 1122   LYMPHSABS 1.0 07/13/2017 1035   MONOABS 0.8 09/16/2022 1122   MONOABS 0.4 07/13/2017 1035   EOSABS 0.1 09/16/2022 1122   EOSABS 0.1 07/13/2017 1035   BASOSABS 0.0 09/16/2022 1122   BASOSABS 0.0 07/13/2017 1035   Comprehensive Metabolic Panel:    Component Value Date/Time   NA 133 (L) 09/16/2022 1122   NA 137 07/13/2017 1035   K 3.6 09/16/2022 1122   K 4.3 07/13/2017 1035   CL 95 (L) 09/16/2022 1122   CO2 29 09/16/2022 1122   CO2 27 07/13/2017 1035   BUN 14 09/16/2022 1122   BUN 15.4 07/13/2017 1035   CREATININE 1.07 (H) 09/16/2022 1122   CREATININE 1.26 (H) 09/15/2022 1425   CREATININE 1.02 05/11/2022 0814   CREATININE 0.8 07/13/2017 1035   GLUCOSE 110 (H) 09/16/2022 1122   GLUCOSE 94 07/13/2017 1035   CALCIUM 8.2 (L) 09/16/2022 1122   CALCIUM 9.3 07/13/2017 1035   AST 72 (H) 09/16/2022 1122   AST 64 (H) 09/15/2022 1425   AST 15 07/13/2017 1035   ALT 78  (H) 09/16/2022 1122   ALT 61 (H) 09/15/2022 1425   ALT 14 07/13/2017 1035   ALKPHOS 77 09/16/2022 1122   ALKPHOS 73 07/13/2017 1035   BILITOT 0.4 09/16/2022 1122   BILITOT 0.4 09/15/2022 1425   BILITOT 0.22 07/13/2017 1035   PROT 6.0 (L) 09/16/2022 1122   PROT 7.7 07/13/2017 1035   ALBUMIN 2.9 (L) 09/16/2022 1122   ALBUMIN 3.5 07/13/2017 1035    RADIOGRAPHIC STUDIES:   NM PET Image Initial (PI) Skull Base To Thigh (F-18 FDG)  Result Date: 09/10/2022 CLINICAL DATA:  Initial treatment strategy for metastatic carcinoma. History of lobular breast cancer. EXAM: NUCLEAR MEDICINE PET SKULL BASE TO THIGH TECHNIQUE: 5.99 mCi F-18 FDG was injected intravenously. Full-ring PET imaging was performed from the skull base to thigh after the radiotracer. CT data was obtained and used for attenuation correction and anatomic localization. Fasting blood glucose: 119 mg/dl COMPARISON:  None Available. FINDINGS: Mediastinal blood pool activity: SUV max 1.3 NECK: No hypermetabolic cervical lymph nodes are identified.Fairly symmetric activity within the lymphoid tissue of Waldeyer's ring is within physiologic limits.No suspicious activity identified within the pharyngeal mucosal space. Incidental CT findings: Bilateral carotid atherosclerosis. CHEST: Focal hypermetabolic activity in the subcarinal space may relate to a lymph node or the midesophagus (SUV max 4.5). No esophageal dilatation. No other hypermetabolic mediastinal, hilar, axillary or internal mammary lymph nodes. No hypermetabolic pulmonary activity or suspicious nodularity. Incidental CT findings: Mild centrilobular emphysema with scattered parenchymal scarring and patchy ground-glass opacities. There is a small hiatal hernia. Diffuse aortic atherosclerosis with lesser involvement of the great vessels and coronary arteries. No esophageal dilatation. ABDOMEN/PELVIS: Widespread hypermetabolic metastatic disease throughout the liver. There is an approximately  3.5 cm lesion in the dome of the right hepatic lobe which has an SUV max of 6.8. There is a large lesion in the left lobe measuring up to 4.1 cm on image 120/2 with an SUV max of  8.5. No hypermetabolic activity identified within the spleen, pancreas or adrenal glands. There is no hypermetabolic nodal activity in the abdomen or pelvis. Incidental CT findings: Aortic and branch vessel atherosclerosis with a small infrarenal abdominal aortic aneurysm which measures up to 2.8 cm transverse on image 147/2. Previous hysterectomy. SKELETON: Widespread hypermetabolic osseous metastatic disease with multiple lytic and sclerotic lesions. For example, a sternal manubrial lesion has an SUV max of 4.6. Lesion within the T12 vertebral body has an SUV max of 7.5. Mild superior endplate compression deformities at T9 and T12 which may be pathologic. Mild proximal femoral involvement bilaterally without evidence of impending pathologic fracture. Incidental CT findings: No significant epidural tumor identified. There is a convex left lumbar scoliosis with multilevel spondylosis. Evidence of prior bilateral mastectomy without chest wall recurrence. IMPRESSION: 1. Widespread hypermetabolic hepatic and osseous metastatic disease as described. 2. Indeterminate hypermetabolic activity in the subcarinal space which may relate to the mid esophagus or a subcarinal lymph node. 3. No evidence of breast/chest wall recurrence. 4. 2.8 cm infrarenal abdominal aortic aneurysm. Recommend follow-up every 5 years. Reference: J Am Coll Radiol 2956;21:308-657. 5. Aortic Atherosclerosis (ICD10-I70.0) and Emphysema (ICD10-J43.9). Electronically Signed   By: Richardean Sale M.D.   On: 09/10/2022 15:11    PERFORMANCE STATUS (ECOG) : 1 - Symptomatic but completely ambulatory  Review of Systems  Constitutional:  Positive for activity change, appetite change and fatigue.  Gastrointestinal:  Positive for nausea.  Unless otherwise noted, a complete review  of systems is negative.  Physical Exam General: NAD, ambulatory without assistive devices Cardiovascular: regular rate and rhythm Pulmonary: normal breathing pattern  Abdomen: soft, nontender, + bowel sounds Extremities: no edema, no joint deformities Skin: no rashes Neurological:AAO x3, mood appropriate   IMPRESSION: This is my initial visit with Mrs. Krystal Brooks. Her husband is present with her today. No acute distress noted. Alert and able to engage appropriately in discussions.  I introduced myself, [place] RN, and Palliative's role in collaboration with the oncology team. Concept of Palliative Care was introduced as specialized medical care for people and their families living with serious illness.  It focuses on providing relief from the symptoms and stress of a serious illness.  The goal is to improve quality of life for both the patient and the family. Values and goals of care important to patient and family were attempted to be elicited.   Mrs. Wiesman shares she lives in the home with her husband of more than 50 years.  They have 2 children and 4 grandchildren.  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 They have 2 horses and she shares in her early years she rode for hobby/pleasure.   At home patient is able to perform all ADLs independently.  Does endorse occasional fatigue and decrease in appetite.  Recent fall with no significant injuries.  Some days are better than others.  Denies any concerns with insomnia.  Belia confirms she is drinking a smoothie daily which is mixed with Ensure.  Also recently met with dietitian due to weight loss and decrease in her appetite.  She shares when she does want certain foods once is in front of her she loses her appetite due to change in taste or at times nausea.  Tries to push at minimum 3 meals daily.  We discussed focusing on small frequent meals versus 3 large meals.  Snacking when hungry or as  desired.  Recommend increasing Ensure smoothly to twice daily  for additional protein support but also being mindful that it is not around mealtime to prevent early satiety.  Ongoing nausea.  She is taking promethazine as needed.  Also has Compazine on hand however has not taken.  She is concerned about increased nausea as she is scheduled to start her Xeloda on tomorrow.  Encourage patient for the next few days to take Compazine around-the-clock and promethazine as needed for breakthrough nausea.  She knows to contact the office if nausea does not resolve or she is facing dehydration.  We also discussed skin maintenance once starting Xeloda with potential side effects of hand-foot.  She is knowledgeable of how to manage if she experiences any side effects and awareness of when to contact the office for further assistance and guidance.  Mrs. Thielke denies any significant pain at this time.  Does have occasional discomforts.  She has tramadol on hand at home however has not had to take regularly.  I discussed the importance of continued conversation with family and their medical providers regarding overall plan of care and treatment options, ensuring decisions are within the context of the patients values and GOCs.  PLAN: Established therapeutic relationship. Education provided on palliative's role in collaboration with their Oncology/Radiation team. Promethazine as needed for nausea Compazine as needed for nausea Tramadol as needed for moderate to severe pain.  Patient has not requiring daily. Education provided on increasing Ensure to twice daily.  Focusing on small frequent meals versus large meals.  Snacking when desired.  Also discussed antiemetic regimen and when to contact clinic for additional symptom management support. Understands we are available for close symptom management support. I will plan to see patient back in 2-4 weeks in collaboration to other oncology appointments.    Patient  expressed understanding and was in agreement with this plan. She also understands that She can call the clinic at any time with any questions, concerns, or complaints.   Thank you for your referral and allowing Palliative to assist in Mrs. Trinidad Curet Kienitz's care.   Number and complexity of problems addressed: HIGH - 1 or more chronic illnesses with SEVERE exacerbation, progression, or side effects of treatment - advanced cancer, pain. Any controlled substances utilized were prescribed in the context of palliative care.  Time Total: 50 min   Visit consisted of counseling and education dealing with the complex and emotionally intense issues of symptom management and palliative care in the setting of serious and potentially life-threatening illness.Greater than 50%  of this time was spent counseling and coordinating care related to the above assessment and plan.  Signed by: Alda Lea, AGPCNP-BC Palliative Medicine Team/[place]

## 2022-10-05 DIAGNOSIS — C50212 Malignant neoplasm of upper-inner quadrant of left female breast: Secondary | ICD-10-CM | POA: Diagnosis not present

## 2022-10-06 ENCOUNTER — Encounter (HOSPITAL_COMMUNITY): Payer: Self-pay | Admitting: Hematology and Oncology

## 2022-10-07 ENCOUNTER — Telehealth: Payer: Self-pay

## 2022-10-07 ENCOUNTER — Telehealth: Payer: Self-pay | Admitting: Hematology and Oncology

## 2022-10-07 ENCOUNTER — Other Ambulatory Visit: Payer: Self-pay

## 2022-10-07 NOTE — Telephone Encounter (Signed)
Spoke with Pt's daughter April in reference to message sent about taking xeloda on the day of her oral surgery. Informed her daughter that Pt should hold morning dose and resume evening dose after the oral surgery. Pt's daughter verbalized understanding with read back of instructions.

## 2022-10-07 NOTE — Telephone Encounter (Signed)
TC from Nurse with [facility] in reference to Pt she stated Pt had some acute changes with her back pain which does not interfere with her activity of daily living. She stated Pt was having lower bck pain that radiates down her right leg. Returned call to [place] who stated she spoke with palliative care and were able to make some changes with her medication. Dr Chryl Heck aware.

## 2022-10-07 NOTE — Telephone Encounter (Signed)
Maurice Small, NP, called from [facility] for pt, reporting an increase in pt pain. Per Lexine Baton, NP ok to increase tramadol to 1-2 tabs q6hrs PRN pain. Anderson Malta verbalized understanding and verbalized she will speak with pt.

## 2022-10-11 DIAGNOSIS — C50911 Malignant neoplasm of unspecified site of right female breast: Secondary | ICD-10-CM | POA: Diagnosis not present

## 2022-10-11 DIAGNOSIS — C7951 Secondary malignant neoplasm of bone: Secondary | ICD-10-CM | POA: Diagnosis not present

## 2022-10-11 DIAGNOSIS — C50919 Malignant neoplasm of unspecified site of unspecified female breast: Secondary | ICD-10-CM | POA: Diagnosis not present

## 2022-10-11 DIAGNOSIS — C787 Secondary malignant neoplasm of liver and intrahepatic bile duct: Secondary | ICD-10-CM | POA: Diagnosis not present

## 2022-10-12 ENCOUNTER — Encounter: Payer: Self-pay | Admitting: Nurse Practitioner

## 2022-10-12 ENCOUNTER — Inpatient Hospital Stay (HOSPITAL_BASED_OUTPATIENT_CLINIC_OR_DEPARTMENT_OTHER): Payer: Medicare HMO | Admitting: Nurse Practitioner

## 2022-10-12 DIAGNOSIS — Z515 Encounter for palliative care: Secondary | ICD-10-CM | POA: Diagnosis not present

## 2022-10-12 DIAGNOSIS — G893 Neoplasm related pain (acute) (chronic): Secondary | ICD-10-CM

## 2022-10-12 DIAGNOSIS — R110 Nausea: Secondary | ICD-10-CM | POA: Diagnosis not present

## 2022-10-12 NOTE — Progress Notes (Signed)
[place]  [phone] [phone]   Name: Krystal Brooks Date: 10/12/2022 MRN: [id number]  DOB: 04-09-1956  Patient Care Team: Susy Frizzle, MD as PCP - General (Family Medicine) Excell Seltzer, MD (Inactive) as Consulting Physician (General Surgery) Druscilla Brownie, MD as Consulting Physician (Dermatology) Dillingham, [name], DO as Attending Physician (Plastic Surgery) Gardenia Phlegm, NP as Nurse Practitioner (Hematology and Oncology) Edythe Clarity, [facility] as Pharmacist (Pharmacist) Benay Pike, MD as Medical Oncologist (Hematology and Oncology) Pickenpack-Cousar, Carlena Sax, NP as Nurse Practitioner (Nurse Practitioner)   I connected with Krystal Brooks on 10/12/22 at 11:30 AM EST by phone and verified that I am speaking with the correct person using two identifiers.   I discussed the limitations, risks, security and privacy concerns of performing an evaluation and management service by telemedicine and the availability of in-person appointments. I also discussed with the patient that there may be a patient responsible charge related to this service. The patient expressed understanding and agreed to proceed.   Other persons participating in the visit and their role in the encounter: N/A   Patient's location: Home   Provider's location: [place]: Krystal Brooks is a 67 y.o. female with oncologic medical history including recurrent estrogen receptor positive breast cancer s/p bilateral mastectomies and radiation (12/2015). 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 Palliative ask to see for symptom and pain management and goals of care.   SOCIAL HISTORY:     reports that she has quit smoking. Her smoking use included cigarettes. She has a 20.00 pack-year smoking history. She has never used smokeless tobacco. She reports current alcohol use. She  reports that she does not use drugs.  ADVANCE DIRECTIVES:  None on file  CODE STATUS: Full code  PAST MEDICAL HISTORY: Past Medical History:  Diagnosis Date   Adie's pupil    LEFT EYE   Allergy    Asthma    Atherosclerosis of aorta (HCC)    bulge above renal arteries on mri 2016   Cancer ([place]) 2017   right breast ca   Cellulitis    chest   Complication of anesthesia    VERY HARD TO WAKE UP     Eczema    Hypertension    Personal history of radiation therapy    PONV (postoperative nausea and vomiting)    Smoker     ALLERGIES:  is allergic to gabapentin, penicillins, sulfa antibiotics, and levaquin [levofloxacin].  MEDICATIONS:  Current Outpatient Medications  Medication Sig Dispense Refill   albuterol (PROVENTIL) (2.5 MG/3ML) 0.083% nebulizer solution TAKE 3 MLS (2.5 MG TOTAL) BY NEBULIZATION EVERY 6 HOURS AS NEEDED WHEEZING OR SHORTNESS OF BREATH. (Patient taking differently: Take 2.5 mg by nebulization every 6 (six) hours as needed for wheezing or shortness of breath.) 300 mL 5   albuterol (VENTOLIN HFA) 108 (90 Base) MCG/ACT inhaler INHALE 1-2 PUFFS EVERY 6 HOURS AS NEEDED FOR WHEEZING. (Patient taking differently: Inhale 1-2 puffs into the lungs every 6 (six) hours as needed for wheezing. INHALE 1-2 PUFFS EVERY 6 HOURS AS NEEDED FOR WHEEZING.) 18 each 5   anastrozole (ARIMIDEX) 1 MG tablet Take 1 tablet (1 mg total) by mouth daily. (Patient taking differently: Take 1 mg by mouth at bedtime.) 90 tablet 3   budesonide-formoterol (SYMBICORT) 80-4.5 MCG/ACT inhaler Inhale 2 puffs into the lungs 2 (two) times daily as needed ("for flares").  capecitabine (XELODA) 500 MG tablet Take 2 tablets (1,000 mg total) by mouth 2 (two) times daily after a meal. Take on days 1-14. Repeat every 21 days. 56 tablet 4   escitalopram (LEXAPRO) 10 MG tablet TAKE 1 TABLET BY MOUTH EVERY DAY 30 tablet 0   feeding supplement (ENSURE ENLIVE / ENSURE PLUS) LIQD Take 237 mLs by mouth 2 (two) times  daily between meals. 10000 mL 0   fluticasone-salmeterol (ADVAIR HFA) 115-21 MCG/ACT inhaler Inhale 2 puffs into the lungs 2 (two) times daily. (Patient not taking: Reported on 09/11/2022) 1 each 12   magnesium oxide (MAG-OX) 400 (240 Mg) MG tablet Take 1 tablet (400 mg total) by mouth 2 (two) times daily. 6 tablet 0   montelukast (SINGULAIR) 10 MG tablet Take 1 tablet (10 mg total) by mouth daily. (Patient taking differently: Take 10 mg by mouth at bedtime.) 30 tablet 11   Multiple Vitamin (MULTIVITAMIN WITH MINERALS) TABS tablet Take 1 tablet by mouth daily. 60 tablet 0   potassium chloride (KLOR-CON M10) 10 MEQ tablet Take 1 tablet (10 mEq total) by mouth daily. 30 tablet 1   prochlorperazine (COMPAZINE) 10 MG tablet Take 1 tablet (10 mg total) by mouth every 6 (six) hours as needed for nausea or vomiting. 30 tablet 1   promethazine (PHENERGAN) 12.5 MG tablet Take 1 tablet (12.5 mg total) by mouth every 6 (six) hours as needed for nausea or vomiting. 90 tablet 0   traMADol (ULTRAM) 50 MG tablet Take 1 tablet (50 mg total) by mouth every 8 (eight) hours as needed. (Patient taking differently: Take 50 mg by mouth every 8 (eight) hours as needed (for pain).) 45 tablet 0   No current facility-administered medications for this visit.    VITAL SIGNS: There were no vitals taken for this visit. There were no vitals filed for this visit.  Estimated body mass index is 21.45 kg/m as calculated from the following:   Height as of 09/15/22: 5' 1"$  (1.549 m).   Weight as of 10/04/22: 113 lb 8 oz (51.5 kg).   PERFORMANCE STATUS (ECOG) : 1 - Symptomatic but completely ambulatory  IMPRESSION: I connected by phone with Krystal Brooks. No acute distress identified. Patient reports she is doing well overall. Remaining as active as possible. She recently completed her first week of oral chemo. Tolerating well.   Denies constipation, diarrhea. Occasional nausea which is controlled with anti-emetics. Minimal pain.  Some days much better than others. Reports taking tramadol sparingly however feels that advil works best. Appetite is much improved.   Patient aware of upcoming appointments with Oncology team. Knows to contact office as needed.   I discussed the importance of continued conversation with family and their medical providers regarding overall plan of care and treatment options, ensuring decisions are within the context of the patients values and GOCs.  PLAN:  Promethazine as needed for nausea Compazine as needed for nausea Tramadol as needed for moderate to severe pain.  Patient is not taking daily. Understands we are available for close symptom management support. I will plan to see patient back in 2-4 weeks in collaboration to other oncology appointments.    Patient expressed understanding and was in agreement with this plan. She also understands that She can call the clinic at any time with any questions, concerns, or complaints.   Any controlled substances utilized were prescribed in the context of palliative care. PDMP has been reviewed.   Time Total: 20 min   Visit consisted of  counseling and education dealing with the complex and emotionally intense issues of symptom management and palliative care in the setting of serious and potentially life-threatening illness.Greater than 50%  of this time was spent counseling and coordinating care related to the above assessment and plan.  Alda Lea, AGPCNP-BC  Palliative Medicine Team/[facility]

## 2022-10-13 ENCOUNTER — Other Ambulatory Visit (HOSPITAL_COMMUNITY): Payer: Self-pay

## 2022-10-14 ENCOUNTER — Other Ambulatory Visit (HOSPITAL_COMMUNITY): Payer: Self-pay

## 2022-10-15 ENCOUNTER — Other Ambulatory Visit (HOSPITAL_COMMUNITY): Payer: Self-pay

## 2022-10-18 ENCOUNTER — Other Ambulatory Visit: Payer: Self-pay

## 2022-10-18 ENCOUNTER — Inpatient Hospital Stay (HOSPITAL_BASED_OUTPATIENT_CLINIC_OR_DEPARTMENT_OTHER): Payer: Medicare HMO

## 2022-10-18 ENCOUNTER — Inpatient Hospital Stay: Payer: Medicare HMO | Admitting: Hematology and Oncology

## 2022-10-18 ENCOUNTER — Inpatient Hospital Stay (HOSPITAL_BASED_OUTPATIENT_CLINIC_OR_DEPARTMENT_OTHER): Payer: Medicare HMO | Admitting: Nurse Practitioner

## 2022-10-18 ENCOUNTER — Encounter: Payer: Self-pay | Admitting: Nurse Practitioner

## 2022-10-18 VITALS — BP 131/62 | HR 99 | Temp 98.1°F | Resp 16 | Ht 61.0 in | Wt 110.3 lb

## 2022-10-18 DIAGNOSIS — C787 Secondary malignant neoplasm of liver and intrahepatic bile duct: Secondary | ICD-10-CM | POA: Diagnosis not present

## 2022-10-18 DIAGNOSIS — C7951 Secondary malignant neoplasm of bone: Secondary | ICD-10-CM

## 2022-10-18 DIAGNOSIS — Z79811 Long term (current) use of aromatase inhibitors: Secondary | ICD-10-CM | POA: Diagnosis not present

## 2022-10-18 DIAGNOSIS — Z923 Personal history of irradiation: Secondary | ICD-10-CM | POA: Diagnosis not present

## 2022-10-18 DIAGNOSIS — R630 Anorexia: Secondary | ICD-10-CM

## 2022-10-18 DIAGNOSIS — G893 Neoplasm related pain (acute) (chronic): Secondary | ICD-10-CM | POA: Diagnosis not present

## 2022-10-18 DIAGNOSIS — C50212 Malignant neoplasm of upper-inner quadrant of left female breast: Secondary | ICD-10-CM

## 2022-10-18 DIAGNOSIS — R634 Abnormal weight loss: Secondary | ICD-10-CM

## 2022-10-18 DIAGNOSIS — D649 Anemia, unspecified: Secondary | ICD-10-CM | POA: Diagnosis not present

## 2022-10-18 DIAGNOSIS — R530 Neoplastic (malignant) related fatigue: Secondary | ICD-10-CM

## 2022-10-18 DIAGNOSIS — C50312 Malignant neoplasm of lower-inner quadrant of left female breast: Secondary | ICD-10-CM | POA: Diagnosis not present

## 2022-10-18 DIAGNOSIS — Z170 Estrogen receptor positive status [ER+]: Secondary | ICD-10-CM | POA: Diagnosis not present

## 2022-10-18 DIAGNOSIS — C50311 Malignant neoplasm of lower-inner quadrant of right female breast: Secondary | ICD-10-CM | POA: Diagnosis not present

## 2022-10-18 DIAGNOSIS — C50919 Malignant neoplasm of unspecified site of unspecified female breast: Secondary | ICD-10-CM | POA: Diagnosis not present

## 2022-10-18 DIAGNOSIS — Z90710 Acquired absence of both cervix and uterus: Secondary | ICD-10-CM | POA: Diagnosis not present

## 2022-10-18 DIAGNOSIS — Z515 Encounter for palliative care: Secondary | ICD-10-CM | POA: Diagnosis not present

## 2022-10-18 DIAGNOSIS — R110 Nausea: Secondary | ICD-10-CM | POA: Diagnosis not present

## 2022-10-18 DIAGNOSIS — Z79899 Other long term (current) drug therapy: Secondary | ICD-10-CM | POA: Diagnosis not present

## 2022-10-18 DIAGNOSIS — Z87891 Personal history of nicotine dependence: Secondary | ICD-10-CM | POA: Diagnosis not present

## 2022-10-18 DIAGNOSIS — Z9013 Acquired absence of bilateral breasts and nipples: Secondary | ICD-10-CM | POA: Diagnosis not present

## 2022-10-18 LAB — CBC WITH DIFFERENTIAL/PLATELET
Abs Immature Granulocytes: 0.14 10*3/uL — ABNORMAL HIGH (ref 0.00–0.07)
Basophils Absolute: 0 10*3/uL (ref 0.0–0.1)
Basophils Relative: 0 %
Eosinophils Absolute: 0.1 10*3/uL (ref 0.0–0.5)
Eosinophils Relative: 1 %
HCT: 27 % — ABNORMAL LOW (ref 36.0–46.0)
Hemoglobin: 8.9 g/dL — ABNORMAL LOW (ref 12.0–15.0)
Immature Granulocytes: 1 %
Lymphocytes Relative: 14 %
Lymphs Abs: 1.4 10*3/uL (ref 0.7–4.0)
MCH: 29.9 pg (ref 26.0–34.0)
MCHC: 33 g/dL (ref 30.0–36.0)
MCV: 90.6 fL (ref 80.0–100.0)
Monocytes Absolute: 0.5 10*3/uL (ref 0.1–1.0)
Monocytes Relative: 5 %
Neutro Abs: 7.8 10*3/uL — ABNORMAL HIGH (ref 1.7–7.7)
Neutrophils Relative %: 79 %
Platelets: 312 10*3/uL (ref 150–400)
RBC: 2.98 MIL/uL — ABNORMAL LOW (ref 3.87–5.11)
RDW: 19 % — ABNORMAL HIGH (ref 11.5–15.5)
WBC: 9.9 10*3/uL (ref 4.0–10.5)
nRBC: 0.2 % (ref 0.0–0.2)

## 2022-10-18 LAB — COMPREHENSIVE METABOLIC PANEL
ALT: 17 U/L (ref 0–44)
AST: 23 U/L (ref 15–41)
Albumin: 3.4 g/dL — ABNORMAL LOW (ref 3.5–5.0)
Alkaline Phosphatase: 103 U/L (ref 38–126)
Anion gap: 7 (ref 5–15)
BUN: 18 mg/dL (ref 8–23)
CO2: 28 mmol/L (ref 22–32)
Calcium: 8.7 mg/dL — ABNORMAL LOW (ref 8.9–10.3)
Chloride: 95 mmol/L — ABNORMAL LOW (ref 98–111)
Creatinine, Ser: 0.81 mg/dL (ref 0.44–1.00)
GFR, Estimated: >60 mL/min (ref >60)
Glucose, Bld: 118 mg/dL — ABNORMAL HIGH (ref 70–99)
Potassium: 4.3 mmol/L (ref 3.5–5.1)
Sodium: 130 mmol/L — ABNORMAL LOW (ref 135–145)
Total Bilirubin: 0.4 mg/dL (ref 0.3–1.2)
Total Protein: 6.7 g/dL (ref 6.5–8.1)

## 2022-10-18 LAB — SAMPLE TO BLOOD BANK

## 2022-10-18 NOTE — Progress Notes (Signed)
Krystal Brooks  [phone] [phone]     ID: Krystal Brooks DOB: Apr 12, 1956  MR#: [id number]  [id number]  Patient Care Team: Susy Frizzle, MD as PCP - General (Family Medicine) Excell Seltzer, MD (Inactive) as Consulting Physician (General Surgery) Druscilla Brownie, MD as Consulting Physician (Dermatology) Dillingham, [name], DO as Attending Physician (Plastic Surgery) Delice Bison Charlestine Massed, NP as Nurse Practitioner (Hematology and Oncology) Edythe Clarity, [facility] as Pharmacist (Pharmacist) Benay Pike, MD as Medical Oncologist (Hematology and Oncology) Pickenpack-Cousar, Carlena Sax, NP as Nurse Practitioner (Nurse Practitioner)  CHIEF COMPLAINT: Recurrent estrogen receptor positive breast cancer (s/p bilateral mastectomies)  CURRENT TREATMENT: Anastrozole  INTERVAL HISTORY:  Since her last visit, she had extractions done last Thursday. She is not able to eat much , lost some weight. She started xeloda on 10/05/2022. She denies any side effects. She denies any pain in the back, she says about 2 weeks  she had some pain in the back. She denies any urinary retention or urinary incontinence, bowel retention or incontinence. She says nausea has gotten much better. Rest of the pertinent 10 point ROS reviewed and negative.   COVID 19 VACCINATION STATUS: [place] x3, last 05/2020; infection 02/2021   BREAST CANCER HISTORY: From Dr Ernestina Penna 01/14/16 note:   "Krystal Brooks 67 y.o. female is here because of Her newly diagnosed right breast cancer. She is accompanied by her husband and of friend to our multidisciplinary rest clinic today.   This was found by screening mammogram,  she denies any palpable mass, skin change or nipple discharge, or ulcer constitutional symptoms before the screening.  Her prior mammo 6 years ago. She had a right breast cyst removed in 1979. "   Malignant neoplasm of lower-inner quadrant of right breast of female,  estrogen receptor positive ([place])    12/29/2015 Mammogram      Diagnostic right mammogram showed a 2.3 x 2.1 x 2.2 cm group of coarse calcification within the medial slightly lower right breast.         01/02/2016 Initial Diagnosis      Breast cancer of lower-inner quadrant of right female breast ([place])         01/02/2016 Initial Biopsy      right breast LIQ core needle biopsy showed LCIS, with a small focus of invasive lobular carcinoma, grade 2.          01/02/2016 Receptors her2      ER 10% positive, moderate staining. PR negative. Insufficient tissue for HER-2 testing.         01/27/2016 Surgery      Right breast lumpectomy and sentinel lymph node biopsy ([place])         01/27/2016 Pathology Results      Right breast lumpectomy showed pleomorphic variant of lobular carcinoma in situ, and scattered microscopic foci of invasive lobular carcinoma (all less than 0.1 cm) in different sections, total 0.6 cm. 0/2 SLN.          01/27/2016 Receptors her2      The surgical sample of the invasive carcinoma was not sufficient for Her2 test and other additional studies.         03/09/2016 - 04/19/2016 Radiation Therapy      Adjuvant breast radiation [name]). Right breast: 50 Gy in 25 fractions. Right breast "boost": 10 Gy in 5 fractions.           04/2016 -  Anti-estrogen oral therapy      Anastrozole 1  mg daily. Planned duration of therapy: 5 years        PAST MEDICAL HISTORY: Past Medical History:  Diagnosis Date   Adie's pupil    LEFT EYE   Allergy    Asthma    Atherosclerosis of aorta (HCC)    bulge above renal arteries on mri 2016   Cancer ([place]) 2017   right breast ca   Cellulitis    chest   Complication of anesthesia    VERY HARD TO WAKE UP     Eczema    Hypertension    Personal history of radiation therapy    PONV (postoperative nausea and vomiting)    Smoker     PAST SURGICAL HISTORY: Past Surgical History:  Procedure Laterality Date   ABDOMINAL HYSTERECTOMY      BREAST BIOPSY Right 01/02/2016   BREAST EXCISIONAL BIOPSY Right 1979   BREAST IMPLANT REMOVAL Bilateral 10/05/2017   Procedure: REMOVALOF BILATERAL BREAST IMPLANTS;  Surgeon: Wallace Going, DO;  Location: [place];  Service: Plastics;  Laterality: Bilateral;   BREAST LUMPECTOMY Right 01/27/2016   BREAST LUMPECTOMY WITH RADIOACTIVE SEED AND SENTINEL LYMPH NODE BIOPSY Right 01/27/2016   Procedure: BREAST LUMPECTOMY WITH RADIOACTIVE SEED AND SENTINEL LYMPH NODE BIOPSY;  Surgeon: Excell Seltzer, MD;  Location: [place];  Service: General;  Laterality: Right;   BREAST RECONSTRUCTION WITH PLACEMENT OF TISSUE EXPANDER AND FLEX HD (ACELLULAR HYDRATED DERMIS) Bilateral 03/16/2017   Procedure: BILATERAL IMMEDIATE BREAST RECONSTRUCTION WITH PLACEMENT OF TISSUE EXPANDER AND FLEX HD (ACELLULAR HYDRATED DERMIS);  Surgeon: Wallace Going, DO;  Location: [place];  Service: Plastics;  Laterality: Bilateral;   BREAST SURGERY  1979   cyst removed rt breast   CATARACT EXTRACTION W/ INTRAOCULAR LENS IMPLANT     RT EYE   CESAREAN SECTION     x2   EXCISION OF BREAST LESION Left 01/30/2018   Procedure: EXCISION OF LEFT BREAST SCAR;  Surgeon: Wallace Going, DO;  Location: [place];  Service: Plastics;  Laterality: Left;   LATISSIMUS FLAP TO BREAST Right 01/30/2018   Procedure: RIGHT LATISSIMUS MYOCUTANEOUS FLAP FOR RIGHT CHEST WALL RECONSTRUCTION;  Surgeon: Wallace Going, DO;  Location: [place];  Service: Plastics;  Laterality: Right;   MASTECTOMY W/ SENTINEL NODE BIOPSY Bilateral 03/16/2017   Procedure: BILATERAL TOTAL MASTECTOMY WITH LEFT SENTINEL LYMPH NODE BIOPSY;  Surgeon: Excell Seltzer, MD;  Location: [place];  Service: General;  Laterality: Bilateral;   REMOVAL OF BILATERAL TISSUE EXPANDERS WITH PLACEMENT OF BILATERAL BREAST IMPLANTS Bilateral 06/15/2017   Procedure: REMOVAL OF BILATERAL TISSUE EXPANDERS WITH PLACEMENT OF BILATERAL BREAST IMPLANTS;  Surgeon:  Wallace Going, DO;  Location: [place];  Service: Plastics;  Laterality: Bilateral;   TOTAL MASTECTOMY Bilateral 03/16/2017   SENTINAL NODE BIOPSY    FAMILY HISTORY Family History  Problem Relation Age of Onset   Hearing loss Mother    Asthma Father    Cancer Father        melanoma, metastases    Diabetes Father    Heart disease Father    Early death Sister    Early death Paternal Grandfather    Cancer Maternal Grandmother 88       colon cancer    Breast cancer Neg Hx    Ovarian cancer Neg Hx   The patient's father died at age 10. He had been diagnosed with melanoma in 1 year. That was not the cause of death. The patient's mother is  living at age 81 as of May 2018. The patient has one brother, no sisters. There is no history of breast or ovarian cancer in the family, and no other melanoma history.  GYNECOLOGIC HISTORY:  No LMP recorded. Patient has had a hysterectomy. 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 She frequently keeps her 2 grandchildren. Her husband Clair Gulling is a retired Airline pilot. He now owns a business where they prepare the connection between the front living in/RV portion and a horse trailer: They do the hitch combination. Daughter April lives in [place] where she works as a Statistician. Daughter Ailene Ravel lives in [place] where she is an Forensic scientist for [employer]. The patient has 4 grandchildren. She is a Tourist information centre manager.    ADVANCED DIRECTIVES: The patient's husband is automatically her healthcare power of attorney   HEALTH MAINTENANCE: Social History   Tobacco Use   Smoking status: Former    Packs/day: 0.50    Years: 40.00    Total pack years: 20.00    Types: Cigarettes   Smokeless tobacco: Never  Vaping Use   Vaping  Use: Never used  Substance Use Topics   Alcohol use: Yes    Alcohol/week: 0.0 standard drinks of alcohol    Comment: social   Drug use: No     Colonoscopy:  PAP:  Bone density:05/24/2016 showed a T score of -0.6 normal   Allergies  Allergen Reactions   Gabapentin Other (See Comments)    Pt states sedates her   Penicillins Anaphylaxis, Swelling, Rash and Other (See Comments)    Older sister died from reaction to penicillin Has patient had a PCN reaction causing immediate rash, facial/tongue/throat swelling, SOB or lightheadedness with hypotension: Yes Has patient had a PCN reaction causing severe rash involving mucus membranes or skin necrosis: No Has patient had a PCN reaction that required hospitalization: No Has patient had a PCN reaction occurring within the last 10 years: No If all of the above answers are "NO", then may proceed with Cephalosporin use.    Sulfa Antibiotics Rash   Levaquin [Levofloxacin] Nausea And Vomiting    Current Outpatient Medications  Medication Sig Dispense Refill   albuterol (PROVENTIL) (2.5 MG/3ML) 0.083% nebulizer solution TAKE 3 MLS (2.5 MG TOTAL) BY NEBULIZATION EVERY 6 HOURS AS NEEDED WHEEZING OR SHORTNESS OF BREATH. (Patient taking differently: Take 2.5 mg by nebulization every 6 (six) hours as needed for wheezing or shortness of breath.) 300 mL 5   albuterol (VENTOLIN HFA) 108 (90 Base) MCG/ACT inhaler INHALE 1-2 PUFFS EVERY 6 HOURS AS NEEDED FOR WHEEZING. (Patient taking differently: Inhale 1-2 puffs into the lungs every 6 (six) hours as needed for wheezing. INHALE 1-2 PUFFS EVERY 6 HOURS AS NEEDED FOR WHEEZING.) 18 each 5   anastrozole (ARIMIDEX) 1 MG tablet Take 1 tablet (1 mg total) by mouth daily. (Patient taking differently: Take 1 mg by mouth at bedtime.) 90 tablet 3   budesonide-formoterol (SYMBICORT) 80-4.5 MCG/ACT inhaler Inhale 2 puffs into the lungs 2 (two) times daily as needed ("for flares").     capecitabine (XELODA) 500 MG tablet  Take 2 tablets (1,000 mg total) by mouth 2 (two) times daily after a meal. Take on days 1-14. Repeat every 21 days. 56 tablet 4   escitalopram (LEXAPRO) 10 MG  tablet TAKE 1 TABLET BY MOUTH EVERY DAY 30 tablet 0   feeding supplement (ENSURE ENLIVE / ENSURE PLUS) LIQD Take 237 mLs by mouth 2 (two) times daily between meals. 10000 mL 0   fluticasone-salmeterol (ADVAIR HFA) 115-21 MCG/ACT inhaler Inhale 2 puffs into the lungs 2 (two) times daily. (Patient not taking: Reported on 09/11/2022) 1 each 12   magnesium oxide (MAG-OX) 400 (240 Mg) MG tablet Take 1 tablet (400 mg total) by mouth 2 (two) times daily. 6 tablet 0   montelukast (SINGULAIR) 10 MG tablet Take 1 tablet (10 mg total) by mouth daily. (Patient taking differently: Take 10 mg by mouth at bedtime.) 30 tablet 11   Multiple Vitamin (MULTIVITAMIN WITH MINERALS) TABS tablet Take 1 tablet by mouth daily. 60 tablet 0   potassium chloride (KLOR-CON M10) 10 MEQ tablet Take 1 tablet (10 mEq total) by mouth daily. 30 tablet 1   prochlorperazine (COMPAZINE) 10 MG tablet Take 1 tablet (10 mg total) by mouth every 6 (six) hours as needed for nausea or vomiting. 30 tablet 1   promethazine (PHENERGAN) 12.5 MG tablet Take 1 tablet (12.5 mg total) by mouth every 6 (six) hours as needed for nausea or vomiting. 90 tablet 0   traMADol (ULTRAM) 50 MG tablet Take 1 tablet (50 mg total) by mouth every 8 (eight) hours as needed. (Patient taking differently: Take 50 mg by mouth every 8 (eight) hours as needed (for pain).) 45 tablet 0   No current facility-administered medications for this visit.    OBJECTIVE: White woman in no acute distress  There were no vitals filed for this visit.     There is no height or weight on file to calculate BMI.    ECOG FS:1 - Symptomatic but completely ambulatory  Physical Exam Constitutional:      Appearance: Normal appearance.  Cardiovascular:     Rate and Rhythm: Normal rate and regular rhythm.     Pulses: Normal pulses.      Heart sounds: Normal heart sounds.  Pulmonary:     Effort: Pulmonary effort is normal.     Breath sounds: Normal breath sounds.  Abdominal:     General: Abdomen is flat.     Palpations: Abdomen is soft.  Musculoskeletal:     Cervical back: Normal range of motion and neck supple. No rigidity.  Lymphadenopathy:     Cervical: No cervical adenopathy.  Skin:    General: Skin is warm and dry.  Neurological:     General: No focal deficit present.     Mental Status: She is alert.  Psychiatric:        Mood and Affect: Mood normal.      LAB RESULTS:  CMP     Component Value Date/Time   NA 133 (L) 09/16/2022 1122   NA 137 07/13/2017 1035   K 3.6 09/16/2022 1122   K 4.3 07/13/2017 1035   CL 95 (L) 09/16/2022 1122   CO2 29 09/16/2022 1122   CO2 27 07/13/2017 1035   GLUCOSE 110 (H) 09/16/2022 1122   GLUCOSE 94 07/13/2017 1035   BUN 14 09/16/2022 1122   BUN 15.4 07/13/2017 1035   CREATININE 1.07 (H) 09/16/2022 1122   CREATININE 1.26 (H) 09/15/2022 1425   CREATININE 1.02 05/11/2022 0814   CREATININE 0.8 07/13/2017 1035   CALCIUM 8.2 (L) 09/16/2022 1122   CALCIUM 9.3 07/13/2017 1035   PROT 6.0 (L) 09/16/2022 1122   PROT 7.7 07/13/2017 1035   ALBUMIN 2.9 (L) 09/16/2022  1122   ALBUMIN 3.5 07/13/2017 1035   AST 72 (H) 09/16/2022 1122   AST 64 (H) 09/15/2022 1425   AST 15 07/13/2017 1035   ALT 78 (H) 09/16/2022 1122   ALT 61 (H) 09/15/2022 1425   ALT 14 07/13/2017 1035   ALKPHOS 77 09/16/2022 1122   ALKPHOS 73 07/13/2017 1035   BILITOT 0.4 09/16/2022 1122   BILITOT 0.4 09/15/2022 1425   BILITOT 0.22 07/13/2017 1035   GFRNONAA 57 (L) 09/16/2022 1122   GFRNONAA 47 (L) 09/15/2022 1425   GFRNONAA 64 12/15/2017 1541   GFRAA >60 06/06/2019 1102   GFRAA 50 (L) 06/20/2018 0930   GFRAA 74 12/15/2017 1541    No results found for: "TOTALPROTELP", "ALBUMINELP", "A1GS", "A2GS", "BETS", "BETA2SER", "GAMS", "MSPIKE", "SPEI"  No results found for: "KPAFRELGTCHN", "LAMBDASER",  "KAPLAMBRATIO"  Lab Results  Component Value Date   WBC 11.1 (H) 09/21/2022   NEUTROABS 8.0 (H) 09/16/2022   HGB 10.5 (L) 09/21/2022   HCT 32.3 (L) 09/21/2022   MCV 87.1 09/21/2022   PLT 182 09/21/2022      Chemistry      Component Value Date/Time   NA 133 (L) 09/16/2022 1122   NA 137 07/13/2017 1035   K 3.6 09/16/2022 1122   K 4.3 07/13/2017 1035   CL 95 (L) 09/16/2022 1122   CO2 29 09/16/2022 1122   CO2 27 07/13/2017 1035   BUN 14 09/16/2022 1122   BUN 15.4 07/13/2017 1035   CREATININE 1.07 (H) 09/16/2022 1122   CREATININE 1.26 (H) 09/15/2022 1425   CREATININE 1.02 05/11/2022 0814   CREATININE 0.8 07/13/2017 1035      Component Value Date/Time   CALCIUM 8.2 (L) 09/16/2022 1122   CALCIUM 9.3 07/13/2017 1035   ALKPHOS 77 09/16/2022 1122   ALKPHOS 73 07/13/2017 1035   AST 72 (H) 09/16/2022 1122   AST 64 (H) 09/15/2022 1425   AST 15 07/13/2017 1035   ALT 78 (H) 09/16/2022 1122   ALT 61 (H) 09/15/2022 1425   ALT 14 07/13/2017 1035   BILITOT 0.4 09/16/2022 1122   BILITOT 0.4 09/15/2022 1425   BILITOT 0.22 07/13/2017 1035       No results found for: "LABCA2"  No components found for: "[id number]"  No results for input(s): "INR" in the last 168 hours.   Urinalysis    Component Value Date/Time   COLORURINE YELLOW 09/10/2022 2131   APPEARANCEUR HAZY (A) 09/10/2022 2131   LABSPEC 1.010 09/10/2022 2131   PHURINE 5.0 09/10/2022 2131   GLUCOSEU NEGATIVE 09/10/2022 2131   HGBUR NEGATIVE 09/10/2022 2131   BILIRUBINUR NEGATIVE 09/10/2022 2131   KETONESUR NEGATIVE 09/10/2022 2131   PROTEINUR NEGATIVE 09/10/2022 2131   NITRITE NEGATIVE 09/10/2022 2131   LEUKOCYTESUR NEGATIVE 09/10/2022 2131     STUDIES: [place] CORE BIOPSY (LIVER)  Result Date: 09/21/2022 INDICATION: History of breast cancer. Hepatic masses, suspicious for metastases. EXAM: ULTRASOUND GUIDED LIVER MASS BIOPSY COMPARISON:  PET-CT, 09/10/2022. MEDICATIONS: Diphenhydramine 25 mg IV.  ANESTHESIA/SEDATION: Moderate (conscious) sedation was employed during this procedure. A total of Versed 2 mg and Fentanyl 100 mcg was administered intravenously. Moderate Sedation Time: 23 minutes. The patient's level of consciousness and vital signs were monitored continuously by radiology nursing throughout the procedure under my direct supervision. COMPLICATIONS: None immediate. PROCEDURE: Informed written consent was obtained from the patient after a discussion of the risks, benefits and alternatives to treatment. The patient understands and consents the procedure. A timeout was performed prior to the initiation of the  procedure. Ultrasound scanning was performed of the epigastrium demonstrates prominent LEFT hepatic lobe mass The LEFT hepatic lobe mass was selected for biopsy and the procedure was planned. The right upper abdominal quadrant was prepped and draped in the usual sterile fashion. The overlying soft tissues were anesthetized with 1% lidocaine with epinephrine. A 17 gauge, 6.8 cm co-axial needle was advanced into a peripheral aspect of the lesion. This was followed by 4 core biopsies with an 18 gauge core device under direct ultrasound guidance. The coaxial needle tract was embolized with a small amount of Gel-Foam slurry and superficial hemostasis was obtained with manual compression. Post procedural scanning was negative for definitive area of hemorrhage or additional complication. A dressing was placed. The patient tolerated the procedure well without immediate post procedural complication. IMPRESSION: Successful ultrasound guided core needle biopsy of liver mass, as above. Michaelle Birks, MD [facility] Radiology Electronically Signed   By: Michaelle Birks M.D.   On: 09/21/2022 14:24     ELIGIBLE FOR AVAILABLE RESEARCH PROTOCOL:   ASSESSMENT: 67 y.o. [place] woman, with bilateral breast cancer   (1) status post right lumpectomy and sentinel  lymph node sampling 01/27/2016 for lobular carcinoma in situ, pleomorphic variant, with multiple foci of invasive lobular carcinoma, all less than a millimeter, but with negative margins and both sentinel lymph nodes negative, invasive disease being estrogen receptor positive, HER-2 not tested (pT1 [mic] pN0}   (2) adjuvant radiation 03/09/2016 through 04/19/2016  The patient initially received a dose of 50 Gy in 25 fractions to the breast using whole-breast tangent fields. This was delivered using a 3-D conformal technique. The patient then received a boost to the seroma. This delivered an additional 10 Gy in 5 fractions using an en face electron field due to the depth of the seroma. The total dose was 60 Gy.   (3) anastrozole started June 2017, discontinued July 2018   (4) status post left breast lower inner quadrant biopsy 12/29/2016 for a clinical T1c N0  invasive lobular breast cancer, grade 2, estrogen receptor positive, progesterone receptor negative HER-2 nonamplified, with a signals ratio 1.35 and the number per cell 2.70.  (5) intermediate Oncotype score of 22 predicts a 10 year risk of recurrence outside the breast of 14% if the patient's only systemic therapy is tamoxifen for 5 years.  (a) the patient opted against chemotherapy Lovena Le Rx results confirm choice)  (6) status post bilateral mastectomies and left sentinel lymph node sampling 03/16/2017 showing  (a) on the right, no evidence of carcinoma  (b) on the left, a pT2 pN0, stage IIA invasive lobular carcinoma, grade 2, with negative margins.  (7) had immediate expander placement at the time of bilateral mastectomies  (a) definitive implant placement with bilateral capsulotomies June 15, 2017, with benign pathology  (b) implants removed February 2019 because of chest discomfort  (c) status post right latissimus flap reconstruction and left capsulotomy 01/30/2018 with benign pathology  (d) decided to have all implants removed  with no further reconstruction.  (Per Dr. Dalbert Mayotte note 03/06/2019).  (8) started tamoxifen 04/30/2017-- plan for total 10 years of antiestrogens given lobular histology   PLAN:  #1 Since her last visit here, she had an MRI for worsening back pain which showed concern for diffuse osseous metastatic disease. PET CT showed wide spread hypermetabolic hepatic and osseous met disease. Indeterminate hypermetabolic activity in subcarinal space. 2.8 cm AAA.  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 She was started on xeloda for impending visceral crisis, tolerating it well She also saw Dr Wetzel Bjornstad for second opinion, Dr Wetzel Bjornstad agrees with plan, she recommended repeat imaging in 6 weeks and consider AI with ribo if no response. Dr. Wetzel Bjornstad also recommended repeating CT chest abdomen pelvis, bone scan as well as MRI spine which has been ordered today.  #2 for cancer related pain, have sent tramadol 50 mg every 8 hours as needed for pain.   Once again she denies any concerning pain in the past week. If there is any role for palliative radiation, we will be more than happy to arrange this.  #3, normocytic normochromic anemia likely from marrow replacement.  No indication for transfusion today  #4 Dental clearance, yet to be obtained.  I once again encouraged the patient to discuss with her oral surgeon about recommendations as soon as possible since she just had extractions.  She will let us know by the end of this week.  Total time spent: 30 min including H and P, review of records, counseling and coordination of care.   *Total Encounter Time as defined by the Centers for Medicare and Medicaid Services includes, in addition to the face-to-face time of a patient visit (documented in the note above) non-face-to-face  time: obtaining and reviewing outside history, ordering and reviewing medications, tests or procedures, care coordination (communications with other health care professionals or caregivers) and documentation in the medical record.

## 2022-10-18 NOTE — Progress Notes (Signed)
[place]  [phone] [phone]   Name: Krystal Brooks Date: 10/18/2022 MRN: [id number]  DOB: [birth year]  Patient Care Team: Susy Frizzle, MD as PCP - General (Family Medicine) Excell Seltzer, MD (Inactive) as Consulting Physician (General Surgery) Druscilla Brownie, MD as Consulting Physician (Dermatology) Dillingham, [name], DO as Attending Physician (Plastic Surgery) Gardenia Phlegm, NP as Nurse Practitioner (Hematology and Oncology) Edythe Clarity, [facility] as Pharmacist (Pharmacist) Benay Pike, MD as Medical Oncologist (Hematology and Oncology) Pickenpack-Cousar, Carlena Sax, NP as Nurse Practitioner (Nurse Practitioner)    INTERVAL HISTORY: Krystal Brooks is a 67 y.o. female with oncologic medical history including recurrent estrogen receptor positive breast cancer s/p bilateral mastectomies and radiation (12/2015). 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 Palliative ask to see for symptom and pain management and goals of care.   SOCIAL HISTORY:     reports that she has quit smoking. Her smoking use included cigarettes. She has a 20.00 pack-year smoking history. She has never used smokeless tobacco. She reports current alcohol use. She reports that she does not use drugs.  ADVANCE DIRECTIVES:  None on file  CODE STATUS: Full code  PAST MEDICAL HISTORY: Past Medical History:  Diagnosis Date   Adie's pupil    LEFT EYE   Allergy    Asthma    Atherosclerosis of aorta (HCC)    bulge above renal arteries on mri 2016   Cancer ([place]) 2017   right breast ca   Cellulitis    chest   Complication of anesthesia    VERY HARD TO WAKE UP     Eczema    Hypertension    Personal history of radiation therapy    PONV (postoperative nausea and vomiting)    Smoker     ALLERGIES:  is allergic to gabapentin, penicillins, sulfa antibiotics, and levaquin  [levofloxacin].  MEDICATIONS:  Current Outpatient Medications  Medication Sig Dispense Refill   albuterol (PROVENTIL) (2.5 MG/3ML) 0.083% nebulizer solution TAKE 3 MLS (2.5 MG TOTAL) BY NEBULIZATION EVERY 6 HOURS AS NEEDED WHEEZING OR SHORTNESS OF BREATH. (Patient taking differently: Take 2.5 mg by nebulization every 6 (six) hours as needed for wheezing or shortness of breath.) 300 mL 5   albuterol (VENTOLIN HFA) 108 (90 Base) MCG/ACT inhaler INHALE 1-2 PUFFS EVERY 6 HOURS AS NEEDED FOR WHEEZING. (Patient taking differently: Inhale 1-2 puffs into the lungs every 6 (six) hours as needed for wheezing. INHALE 1-2 PUFFS EVERY 6 HOURS AS NEEDED FOR WHEEZING.) 18 each 5   anastrozole (ARIMIDEX) 1 MG tablet Take 1 tablet (1 mg total) by mouth daily. (Patient taking differently: Take 1 mg by mouth at bedtime.) 90 tablet 3   budesonide-formoterol (SYMBICORT) 80-4.5 MCG/ACT inhaler Inhale 2 puffs into the lungs 2 (two) times daily as needed ("for flares").     capecitabine (XELODA) 500 MG tablet Take 2 tablets (1,000 mg total) by mouth 2 (two) times daily after a meal. Take on days 1-14. Repeat every 21 days. 56 tablet 4   escitalopram (LEXAPRO) 10 MG tablet TAKE 1 TABLET BY MOUTH EVERY DAY 30 tablet 0   feeding supplement (ENSURE ENLIVE / ENSURE PLUS) LIQD Take 237 mLs by mouth 2 (two) times daily between meals. 10000 mL 0   fluticasone-salmeterol (ADVAIR HFA) 115-21 MCG/ACT inhaler Inhale 2 puffs into the lungs 2 (two) times daily. (Patient not taking: Reported on 09/11/2022) 1 each 12   magnesium oxide (MAG-OX) 400 (240  Mg) MG tablet Take 1 tablet (400 mg total) by mouth 2 (two) times daily. 6 tablet 0   montelukast (SINGULAIR) 10 MG tablet Take 1 tablet (10 mg total) by mouth daily. (Patient taking differently: Take 10 mg by mouth at bedtime.) 30 tablet 11   Multiple Vitamin (MULTIVITAMIN WITH MINERALS) TABS tablet Take 1 tablet by mouth daily. 60 tablet 0   potassium chloride (KLOR-CON M10) 10 MEQ tablet  Take 1 tablet (10 mEq total) by mouth daily. 30 tablet 1   prochlorperazine (COMPAZINE) 10 MG tablet Take 1 tablet (10 mg total) by mouth every 6 (six) hours as needed for nausea or vomiting. 30 tablet 1   promethazine (PHENERGAN) 12.5 MG tablet Take 1 tablet (12.5 mg total) by mouth every 6 (six) hours as needed for nausea or vomiting. 90 tablet 0   traMADol (ULTRAM) 50 MG tablet Take 1 tablet (50 mg total) by mouth every 8 (eight) hours as needed. (Patient taking differently: Take 50 mg by mouth every 8 (eight) hours as needed (for pain).) 45 tablet 0   No current facility-administered medications for this visit.    VITAL SIGNS: There were no vitals taken for this visit. There were no vitals filed for this visit.  Estimated body mass index is 21.45 kg/m as calculated from the following:   Height as of 09/15/22: 5' 1"$  (1.549 m).   Weight as of 10/04/22: 113 lb 8 oz (51.5 kg).  Assessment NAD RRR Normal breathing pattern A&O x4 Right eye/facial bruising s/p dental procedure   PERFORMANCE STATUS (ECOG) : 1 - Symptomatic but completely ambulatory  IMPRESSION: Mrs. Colclough presents to clinic for symptom management follow-up. Is doing well overall. She is much appreciative of her tolerance to treatment. No significant side effects.   Denies nausea, vomiting, constipation, or diarrhea. Appetite is fair. Weight down 3 lbs however she recently had dental procedures causing decrease in appetite and ability to eat. Nausea is minimal over the past week. Denies pain at this time. She is taking things one day at a time. Hemoglobin 8.9. asymptomatic.   I discussed the importance of continued conversation with family and their medical providers regarding overall plan of care and treatment options, ensuring decisions are within the context of the patients values and GOCs.  PLAN:  Promethazine as needed for nausea Compazine as needed for nausea Tramadol as needed for moderate to severe pain.  Patient  is not taking daily. Understands we are available for close symptom management support. I will plan to see patient back in 2-4 weeks in collaboration to other oncology appointments.    Patient expressed understanding and was in agreement with this plan. She also understands that She can call the clinic at any time with any questions, concerns, or complaints.   Time Total: 20 min   Visit consisted of counseling and education dealing with the complex and emotionally intense issues of symptom management and palliative care in the setting of serious and potentially life-threatening illness.Greater than 50%  of this time was spent counseling and coordinating care related to the above assessment and plan.  Alda Lea, AGPCNP-BC  Palliative Medicine Team/[place]

## 2022-10-19 ENCOUNTER — Other Ambulatory Visit (HOSPITAL_COMMUNITY): Payer: Self-pay

## 2022-10-20 ENCOUNTER — Encounter: Payer: Self-pay | Admitting: Hematology and Oncology

## 2022-10-20 NOTE — Progress Notes (Signed)
Dental clearance received from Dr. Leamon Arnt. Will be scanned into chart.

## 2022-10-28 ENCOUNTER — Other Ambulatory Visit: Payer: Self-pay | Admitting: Family Medicine

## 2022-10-28 DIAGNOSIS — J45901 Unspecified asthma with (acute) exacerbation: Secondary | ICD-10-CM

## 2022-10-28 NOTE — Telephone Encounter (Signed)
Requested medication (s) are due for refill today - provider review   Requested medication (s) are on the active medication list -yes  Future visit scheduled -no  Last refill: 09/11/22   Notes to clinic: historical provider Rx- sent for review   Requested Prescriptions  Pending Prescriptions Disp Refills   budesonide-formoterol (SYMBICORT) 80-4.5 MCG/ACT inhaler [Pharmacy Med Name: BUDESONIDE-FORMOTEROL 80-4.5] 10.2 each 3    Sig: TAKE 2 PUFFS BY MOUTH TWICE A DAY     Pulmonology:  Combination Products Failed - 10/28/2022  7:58 AM      Failed - Valid encounter within last 12 months    Recent Outpatient Visits           1 year ago Hypomagnesemia   [place], Cammie Mcgee, MD   1 year ago History of breast cancer in female   [place], Cammie Mcgee, MD   2 years ago History of breast cancer in female   [place], Cammie Mcgee, MD   2 years ago Exacerbation of asthma, unspecified asthma severity, unspecified whether persistent   [place], [place], FNP   2 years ago Exacerbation of asthma, unspecified asthma severity, unspecified whether persistent   [place], Matthias Hughs, FNP                 Requested Prescriptions  Pending Prescriptions Disp Refills   budesonide-formoterol (SYMBICORT) 80-4.5 MCG/ACT inhaler [Pharmacy Med Name: BUDESONIDE-FORMOTEROL 80-4.5] 10.2 each 3    Sig: TAKE 2 PUFFS BY MOUTH TWICE A DAY     Pulmonology:  Combination Products Failed - 10/28/2022  7:58 AM      Failed - Valid encounter within last 12 months    Recent Outpatient Visits           1 year ago Hypomagnesemia   [place], Cammie Mcgee, MD   1 year ago History of breast cancer in female   [place], Cammie Mcgee, MD   2 years ago History of breast cancer in female   [place], Cammie Mcgee, MD   2  years ago Exacerbation of asthma, unspecified asthma severity, unspecified whether persistent   [place], [place], FNP   2 years ago Exacerbation of asthma, unspecified asthma severity, unspecified whether persistent   [place], [place], FNP

## 2022-10-29 ENCOUNTER — Other Ambulatory Visit: Payer: Self-pay | Admitting: Hematology and Oncology

## 2022-11-02 ENCOUNTER — Other Ambulatory Visit: Payer: Self-pay

## 2022-11-02 ENCOUNTER — Telehealth: Payer: Self-pay | Admitting: Family Medicine

## 2022-11-02 ENCOUNTER — Other Ambulatory Visit (HOSPITAL_COMMUNITY): Payer: Self-pay

## 2022-11-02 DIAGNOSIS — J441 Chronic obstructive pulmonary disease with (acute) exacerbation: Secondary | ICD-10-CM

## 2022-11-02 DIAGNOSIS — F17200 Nicotine dependence, unspecified, uncomplicated: Secondary | ICD-10-CM

## 2022-11-02 MED ORDER — BUDESONIDE-FORMOTEROL FUMARATE 80-4.5 MCG/ACT IN AERO: 2.0000 | INHALATION_SPRAY | Freq: Two times a day (BID) | RESPIRATORY_TRACT | 3 refills | Status: DC | PRN

## 2022-11-02 NOTE — Telephone Encounter (Signed)
Patient called to follow up on refill request from pharmacy; stated they haven't received a reply for their refill request of  budesonide-formoterol (SYMBICORT) 80-4.5 MCG/ACT inhaler   Pharmacy:   [id number]- [place], [place] - [id number]: [phone] Fax: [phone] #: [id number]   Please advise patient at [phone]

## 2022-11-03 ENCOUNTER — Encounter (HOSPITAL_COMMUNITY)
Admission: RE | Admit: 2022-11-03 | Discharge: 2022-11-03 | Disposition: A | Discharge status: Home / Self Care | Payer: Medicare HMO | Source: Ambulatory Visit | Attending: Hematology and Oncology | Admitting: Hematology and Oncology

## 2022-11-03 DIAGNOSIS — C50919 Malignant neoplasm of unspecified site of unspecified female breast: Secondary | ICD-10-CM | POA: Diagnosis not present

## 2022-11-03 DIAGNOSIS — C7951 Secondary malignant neoplasm of bone: Secondary | ICD-10-CM | POA: Insufficient documentation

## 2022-11-03 MED ORDER — TECHNETIUM TC 99M MEDRONATE IV KIT
20.0000 | PACK | Freq: Once | INTRAVENOUS | Status: AC | PRN
  Administered 2022-11-03: 19.9 via INTRAVENOUS

## 2022-11-08 ENCOUNTER — Encounter: Payer: Self-pay | Admitting: Hematology and Oncology

## 2022-11-08 NOTE — Progress Notes (Deleted)
Krystal Brooks  [phone] [phone]) [phone]   Name: Krystal Brooks Date: 11/08/2022 MRN: [id number]  DOB: [birth year]  Patient Care Team: Krystal Frizzle, MD as PCP - General (Family Medicine) Krystal Seltzer, MD (Inactive) as Consulting Physician (General Surgery) Krystal Brownie, MD as Consulting Physician (Dermatology) Dillingham, [name], DO as Attending Physician (Plastic Surgery) Krystal Phlegm, NP as Nurse Practitioner (Hematology and Oncology) Krystal Brooks, [facility] as Pharmacist (Pharmacist) Krystal Pike, MD as Medical Oncologist (Hematology and Oncology) Pickenpack-Cousar, Carlena Sax, NP as Nurse Practitioner (Nurse Practitioner)    INTERVAL HISTORY: Krystal Brooks is a 67 y.o. female with oncologic medical history including recurrent estrogen receptor positive breast cancer s/p bilateral mastectomies and radiation (12/2015). 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 Palliative ask to see for symptom and pain management and goals of care.   SOCIAL HISTORY:     reports that she has quit smoking. Her smoking use included cigarettes. She has a 20.00 pack-year smoking history. She has never used smokeless tobacco. She reports current alcohol use. She reports that she does not use drugs.  ADVANCE DIRECTIVES:  None on file  CODE STATUS: Full code  PAST MEDICAL HISTORY: Past Medical History:  Diagnosis Date   Adie's pupil    LEFT EYE   Allergy    Asthma    Atherosclerosis of aorta (HCC)    bulge above renal arteries on mri 2016   Cancer ([place]) 2017   right breast ca   Cellulitis    chest   Complication of anesthesia    VERY HARD TO WAKE UP     Eczema    Hypertension    Personal history of radiation therapy    PONV (postoperative nausea and vomiting)    Smoker     ALLERGIES:  is allergic to gabapentin, penicillins, sulfa antibiotics, and levaquin  [levofloxacin].  MEDICATIONS:  Current Outpatient Medications  Medication Sig Dispense Refill   albuterol (PROVENTIL) (2.5 MG/3ML) 0.083% nebulizer solution TAKE 3 MLS (2.5 MG TOTAL) BY NEBULIZATION EVERY 6 HOURS AS NEEDED WHEEZING OR SHORTNESS OF BREATH. (Patient taking differently: Take 2.5 mg by nebulization every 6 (six) hours as needed for wheezing or shortness of breath.) 300 mL 5   albuterol (VENTOLIN HFA) 108 (90 Base) MCG/ACT inhaler INHALE 1-2 PUFFS EVERY 6 HOURS AS NEEDED FOR WHEEZING. (Patient taking differently: Inhale 1-2 puffs into the lungs every 6 (six) hours as needed for wheezing. INHALE 1-2 PUFFS EVERY 6 HOURS AS NEEDED FOR WHEEZING.) 18 each 5   anastrozole (ARIMIDEX) 1 MG tablet Take 1 tablet (1 mg total) by mouth daily. (Patient taking differently: Take 1 mg by mouth at bedtime.) 90 tablet 3   budesonide-formoterol (SYMBICORT) 80-4.5 MCG/ACT inhaler Inhale 2 puffs into the lungs 2 (two) times daily as needed ("for flares"). 1 each 3   capecitabine (XELODA) 500 MG tablet Take 2 tablets (1,000 mg total) by mouth 2 (two) times daily after a meal. Take on days 1-14. Repeat every 21 days. 56 tablet 4   escitalopram (LEXAPRO) 10 MG tablet TAKE 1 TABLET BY MOUTH EVERY DAY 30 tablet 0   feeding supplement (ENSURE ENLIVE / ENSURE PLUS) LIQD Take 237 mLs by mouth 2 (two) times daily between meals. 10000 mL 0   fluticasone-salmeterol (ADVAIR HFA) 115-21 MCG/ACT inhaler Inhale 2 puffs into the lungs 2 (two) times daily. (Patient not taking: Reported on 09/11/2022) 1 each 12   magnesium oxide (MAG-OX) 400 (  240 Mg) MG tablet Take 1 tablet (400 mg total) by mouth 2 (two) times daily. 6 tablet 0   montelukast (SINGULAIR) 10 MG tablet Take 1 tablet (10 mg total) by mouth daily. (Patient taking differently: Take 10 mg by mouth at bedtime.) 30 tablet 11   Multiple Vitamin (MULTIVITAMIN WITH MINERALS) TABS tablet Take 1 tablet by mouth daily. 60 tablet 0   potassium chloride (KLOR-CON M10) 10 MEQ  tablet Take 1 tablet (10 mEq total) by mouth daily. 30 tablet 1   prochlorperazine (COMPAZINE) 10 MG tablet Take 1 tablet (10 mg total) by mouth every 6 (six) hours as needed for nausea or vomiting. 30 tablet 1   promethazine (PHENERGAN) 12.5 MG tablet Take 1 tablet (12.5 mg total) by mouth every 6 (six) hours as needed for nausea or vomiting. 90 tablet 0   traMADol (ULTRAM) 50 MG tablet Take 1 tablet (50 mg total) by mouth every 8 (eight) hours as needed. (Patient taking differently: Take 50 mg by mouth every 8 (eight) hours as needed (for pain).) 45 tablet 0   No current facility-administered medications for this visit.    VITAL SIGNS: There were no vitals taken for this visit. There were no vitals filed for this visit.  Estimated body mass index is 20.84 kg/m as calculated from the following:   Height as of 10/18/22: '5\' 1"'$  (1.549 m).   Weight as of 10/18/22: 110 lb 4.8 oz (50 kg).  Assessment NAD RRR Normal breathing pattern A&O x4 Right eye/facial bruising s/p dental procedure   PERFORMANCE STATUS (ECOG) : 1 - Symptomatic but completely ambulatory  IMPRESSION:   I discussed the importance of continued conversation with family and their medical providers regarding overall plan of care and treatment options, ensuring decisions are within the context of the patients values and GOCs.  PLAN:  Promethazine as needed for nausea Compazine as needed for nausea Tramadol as needed for moderate to severe pain.  Patient is not taking daily. Understands we are available for close symptom management support. I will plan to see patient back in 2-4 weeks in collaboration to other oncology appointments.    Patient expressed understanding and was in agreement with this plan. She also understands that She can call the clinic at any time with any questions, concerns, or complaints.   Time Total: 20 min   Visit consisted of counseling and education dealing with the complex and emotionally  intense issues of symptom management and palliative care in the setting of serious and potentially life-threatening illness.Greater than 50%  of this time was spent counseling and coordinating care related to the above assessment and plan.  Krystal Brooks, AGPCNP-BC  Palliative Medicine Team/[place]

## 2022-11-09 ENCOUNTER — Other Ambulatory Visit (HOSPITAL_COMMUNITY): Payer: Self-pay

## 2022-11-10 ENCOUNTER — Ambulatory Visit (HOSPITAL_COMMUNITY)
Admission: RE | Admit: 2022-11-10 | Discharge: 2022-11-10 | Disposition: A | Discharge status: Home / Self Care | Payer: Medicare HMO | Source: Ambulatory Visit | Attending: Hematology and Oncology | Admitting: Hematology and Oncology

## 2022-11-10 ENCOUNTER — Telehealth: Payer: Self-pay | Admitting: Nurse Practitioner

## 2022-11-10 ENCOUNTER — Inpatient Hospital Stay: Payer: Medicare HMO | Admitting: Nurse Practitioner

## 2022-11-10 DIAGNOSIS — C7951 Secondary malignant neoplasm of bone: Secondary | ICD-10-CM | POA: Diagnosis not present

## 2022-11-10 DIAGNOSIS — J432 Centrilobular emphysema: Secondary | ICD-10-CM | POA: Diagnosis not present

## 2022-11-10 DIAGNOSIS — C50919 Malignant neoplasm of unspecified site of unspecified female breast: Secondary | ICD-10-CM | POA: Diagnosis not present

## 2022-11-10 DIAGNOSIS — C787 Secondary malignant neoplasm of liver and intrahepatic bile duct: Secondary | ICD-10-CM | POA: Diagnosis not present

## 2022-11-10 DIAGNOSIS — K828 Other specified diseases of gallbladder: Secondary | ICD-10-CM | POA: Diagnosis not present

## 2022-11-10 DIAGNOSIS — J90 Pleural effusion, not elsewhere classified: Secondary | ICD-10-CM | POA: Diagnosis not present

## 2022-11-10 MED ORDER — IOHEXOL 9 MG/ML PO SOLN
ORAL | Status: AC
  Filled 2022-11-10: qty 1000

## 2022-11-10 MED ORDER — IOHEXOL 9 MG/ML PO SOLN
500.0000 mL | ORAL | Status: AC
  Administered 2022-11-10: 1000 mL via ORAL

## 2022-11-10 MED ORDER — IOHEXOL 300 MG/ML  SOLN
100.0000 mL | Freq: Once | INTRAMUSCULAR | Status: AC | PRN
  Administered 2022-11-10: 100 mL via INTRAVENOUS

## 2022-11-10 NOTE — Telephone Encounter (Signed)
Per 3/13 IB reached out to reschedule patient. Patient aware of date and time of appointment.

## 2022-11-11 ENCOUNTER — Other Ambulatory Visit: Payer: Self-pay

## 2022-11-12 ENCOUNTER — Encounter: Payer: Self-pay | Admitting: *Deleted

## 2022-11-12 NOTE — Progress Notes (Deleted)
[place]  [phone]) [phone] Fax:(336) [phone]   Name: [name] Date: 11/12/2022 MRN: [id number]  DOB: Jan 16, 1956  Patient Care Team: Susy Frizzle, MD as PCP - General (Family Medicine) Excell Seltzer, MD (Inactive) as Consulting Physician (General Surgery) Druscilla Brownie, MD as Consulting Physician (Dermatology) Dillingham, [name], DO as Attending Physician (Plastic Surgery) Gardenia Phlegm, NP as Nurse Practitioner (Hematology and Oncology) Edythe Clarity, [facility] as Pharmacist (Pharmacist) Benay Pike, MD as Medical Oncologist (Hematology and Oncology) Pickenpack-Cousar, Carlena Sax, NP as Nurse Practitioner (Nurse Practitioner)    INTERVAL HISTORY: Krystal Brooks is a 67 y.o. female with oncologic medical history including recurrent estrogen receptor positive breast cancer s/p bilateral mastectomies and radiation (12/2015). 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 Palliative ask to see for symptom and pain management and goals of care.   SOCIAL HISTORY:     reports that she has quit smoking. Her smoking use included cigarettes. She has a 20.00 pack-year smoking history. She has never used smokeless tobacco. She reports current alcohol use. She reports that she does not use drugs.  ADVANCE DIRECTIVES:  None on file  CODE STATUS: Full code  PAST MEDICAL HISTORY: Past Medical History:  Diagnosis Date   Adie's pupil    LEFT EYE   Allergy    Asthma    Atherosclerosis of aorta (HCC)    bulge above renal arteries on mri 2016   Cancer ([place]) 2017   right breast ca   Cellulitis    chest   Complication of anesthesia    VERY HARD TO WAKE UP     Eczema    Hypertension    Personal history of radiation therapy    PONV (postoperative nausea and vomiting)    Smoker     ALLERGIES:  is allergic to gabapentin, penicillins, sulfa antibiotics, and levaquin  [levofloxacin].  MEDICATIONS:  Current Outpatient Medications  Medication Sig Dispense Refill   albuterol (PROVENTIL) (2.5 MG/3ML) 0.083% nebulizer solution TAKE 3 MLS (2.5 MG TOTAL) BY NEBULIZATION EVERY 6 HOURS AS NEEDED WHEEZING OR SHORTNESS OF BREATH. (Patient taking differently: Take 2.5 mg by nebulization every 6 (six) hours as needed for wheezing or shortness of breath.) 300 mL 5   albuterol (VENTOLIN HFA) 108 (90 Base) MCG/ACT inhaler INHALE 1-2 PUFFS EVERY 6 HOURS AS NEEDED FOR WHEEZING. (Patient taking differently: Inhale 1-2 puffs into the lungs every 6 (six) hours as needed for wheezing. INHALE 1-2 PUFFS EVERY 6 HOURS AS NEEDED FOR WHEEZING.) 18 each 5   anastrozole (ARIMIDEX) 1 MG tablet Take 1 tablet (1 mg total) by mouth daily. (Patient taking differently: Take 1 mg by mouth at bedtime.) 90 tablet 3   budesonide-formoterol (SYMBICORT) 80-4.5 MCG/ACT inhaler Inhale 2 puffs into the lungs 2 (two) times daily as needed ("for flares"). 1 each 3   capecitabine (XELODA) 500 MG tablet Take 2 tablets (1,000 mg total) by mouth 2 (two) times daily after a meal. Take on days 1-14. Repeat every 21 days. 56 tablet 4   escitalopram (LEXAPRO) 10 MG tablet TAKE 1 TABLET BY MOUTH EVERY DAY 30 tablet 0   feeding supplement (ENSURE ENLIVE / ENSURE PLUS) LIQD Take 237 mLs by mouth 2 (two) times daily between meals. 10000 mL 0   fluticasone-salmeterol (ADVAIR HFA) 115-21 MCG/ACT inhaler Inhale 2 puffs into the lungs 2 (two) times daily. (Patient not taking: Reported on 09/11/2022) 1 each 12   magnesium oxide (MAG-OX) 400 (  240 Mg) MG tablet Take 1 tablet (400 mg total) by mouth 2 (two) times daily. 6 tablet 0   montelukast (SINGULAIR) 10 MG tablet Take 1 tablet (10 mg total) by mouth daily. (Patient taking differently: Take 10 mg by mouth at bedtime.) 30 tablet 11   Multiple Vitamin (MULTIVITAMIN WITH MINERALS) TABS tablet Take 1 tablet by mouth daily. 60 tablet 0   potassium chloride (KLOR-CON M10) 10 MEQ  tablet Take 1 tablet (10 mEq total) by mouth daily. 30 tablet 1   prochlorperazine (COMPAZINE) 10 MG tablet Take 1 tablet (10 mg total) by mouth every 6 (six) hours as needed for nausea or vomiting. 30 tablet 1   promethazine (PHENERGAN) 12.5 MG tablet Take 1 tablet (12.5 mg total) by mouth every 6 (six) hours as needed for nausea or vomiting. 90 tablet 0   traMADol (ULTRAM) 50 MG tablet Take 1 tablet (50 mg total) by mouth every 8 (eight) hours as needed. (Patient taking differently: Take 50 mg by mouth every 8 (eight) hours as needed (for pain).) 45 tablet 0   No current facility-administered medications for this visit.    VITAL SIGNS: There were no vitals taken for this visit. There were no vitals filed for this visit.  Estimated body mass index is 20.84 kg/m as calculated from the following:   Height as of 10/18/22: '5\' 1"'$  (1.549 m).   Weight as of 10/18/22: 110 lb 4.8 oz (50 kg).  Assessment NAD RRR Normal breathing pattern A&O x4 Right eye/facial bruising s/p dental procedure   PERFORMANCE STATUS (ECOG) : 1 - Symptomatic but completely ambulatory  IMPRESSION:   I discussed the importance of continued conversation with family and their medical providers regarding overall plan of care and treatment options, ensuring decisions are within the context of the patients values and GOCs.  PLAN:  Promethazine as needed for nausea Compazine as needed for nausea Tramadol as needed for moderate to severe pain.  Patient is not taking daily. Understands we are available for close symptom management support. I will plan to see patient back in 2-4 weeks in collaboration to other oncology appointments.    Patient expressed understanding and was in agreement with this plan. She also understands that She can call the clinic at any time with any questions, concerns, or complaints.   Time Total: 20 min   Visit consisted of counseling and education dealing with the complex and emotionally  intense issues of symptom management and palliative care in the setting of serious and potentially life-threatening illness.Greater than 50%  of this time was spent counseling and coordinating care related to the above assessment and plan.  Alda Lea, AGPCNP-BC  Palliative Medicine Team/[place]

## 2022-11-15 ENCOUNTER — Ambulatory Visit (HOSPITAL_COMMUNITY)
Admission: RE | Admit: 2022-11-15 | Discharge: 2022-11-15 | Disposition: A | Discharge status: Home / Self Care | Payer: Medicare HMO | Source: Ambulatory Visit | Attending: Hematology and Oncology | Admitting: Hematology and Oncology

## 2022-11-15 DIAGNOSIS — C7951 Secondary malignant neoplasm of bone: Secondary | ICD-10-CM | POA: Insufficient documentation

## 2022-11-15 DIAGNOSIS — C50919 Malignant neoplasm of unspecified site of unspecified female breast: Secondary | ICD-10-CM

## 2022-11-15 MED ORDER — GADOBUTROL 1 MMOL/ML IV SOLN
5.0000 mL | Freq: Once | INTRAVENOUS | Status: AC | PRN
  Administered 2022-11-15: 5 mL via INTRAVENOUS

## 2022-11-16 ENCOUNTER — Inpatient Hospital Stay: Payer: Medicare HMO | Admitting: Nurse Practitioner

## 2022-11-16 ENCOUNTER — Telehealth: Payer: Self-pay | Admitting: Nurse Practitioner

## 2022-11-16 NOTE — Telephone Encounter (Signed)
Per 3/19 IB reached out to patient to cancel upcoming appointment, no answer.

## 2022-11-17 DIAGNOSIS — J90 Pleural effusion, not elsewhere classified: Secondary | ICD-10-CM | POA: Diagnosis not present

## 2022-11-17 DIAGNOSIS — C787 Secondary malignant neoplasm of liver and intrahepatic bile duct: Secondary | ICD-10-CM | POA: Diagnosis not present

## 2022-11-17 DIAGNOSIS — C7951 Secondary malignant neoplasm of bone: Secondary | ICD-10-CM | POA: Diagnosis not present

## 2022-11-17 DIAGNOSIS — C50919 Malignant neoplasm of unspecified site of unspecified female breast: Secondary | ICD-10-CM | POA: Diagnosis not present

## 2022-11-23 DIAGNOSIS — C50911 Malignant neoplasm of unspecified site of right female breast: Secondary | ICD-10-CM | POA: Diagnosis not present

## 2022-11-23 DIAGNOSIS — Z87891 Personal history of nicotine dependence: Secondary | ICD-10-CM | POA: Diagnosis not present

## 2022-11-23 DIAGNOSIS — Z79899 Other long term (current) drug therapy: Secondary | ICD-10-CM | POA: Diagnosis not present

## 2022-11-23 DIAGNOSIS — C7951 Secondary malignant neoplasm of bone: Secondary | ICD-10-CM | POA: Diagnosis not present

## 2022-11-23 DIAGNOSIS — C50312 Malignant neoplasm of lower-inner quadrant of left female breast: Secondary | ICD-10-CM | POA: Diagnosis not present

## 2022-11-23 DIAGNOSIS — C787 Secondary malignant neoplasm of liver and intrahepatic bile duct: Secondary | ICD-10-CM | POA: Diagnosis not present

## 2022-11-23 DIAGNOSIS — M25559 Pain in unspecified hip: Secondary | ICD-10-CM | POA: Diagnosis not present

## 2022-11-23 DIAGNOSIS — C50912 Malignant neoplasm of unspecified site of left female breast: Secondary | ICD-10-CM | POA: Diagnosis not present

## 2022-11-25 ENCOUNTER — Other Ambulatory Visit (HOSPITAL_COMMUNITY): Payer: Self-pay

## 2022-11-30 ENCOUNTER — Telehealth: Payer: Self-pay | Admitting: Hematology and Oncology

## 2022-11-30 NOTE — Telephone Encounter (Signed)
Reached out to patient to schedule per 4/2 IB, left voicemail for patient.

## 2022-12-01 ENCOUNTER — Other Ambulatory Visit (HOSPITAL_COMMUNITY): Payer: Self-pay

## 2022-12-13 ENCOUNTER — Other Ambulatory Visit: Payer: Self-pay

## 2022-12-13 ENCOUNTER — Other Ambulatory Visit (HOSPITAL_COMMUNITY): Payer: Self-pay

## 2022-12-14 DIAGNOSIS — C787 Secondary malignant neoplasm of liver and intrahepatic bile duct: Secondary | ICD-10-CM | POA: Diagnosis not present

## 2022-12-14 DIAGNOSIS — M545 Low back pain, unspecified: Secondary | ICD-10-CM | POA: Diagnosis not present

## 2022-12-14 DIAGNOSIS — C50911 Malignant neoplasm of unspecified site of right female breast: Secondary | ICD-10-CM | POA: Diagnosis not present

## 2022-12-14 DIAGNOSIS — M5136 Other intervertebral disc degeneration, lumbar region: Secondary | ICD-10-CM | POA: Diagnosis not present

## 2022-12-14 DIAGNOSIS — G8929 Other chronic pain: Secondary | ICD-10-CM | POA: Diagnosis not present

## 2022-12-14 DIAGNOSIS — C50912 Malignant neoplasm of unspecified site of left female breast: Secondary | ICD-10-CM | POA: Diagnosis not present

## 2022-12-14 DIAGNOSIS — M5134 Other intervertebral disc degeneration, thoracic region: Secondary | ICD-10-CM | POA: Diagnosis not present

## 2022-12-14 DIAGNOSIS — M5030 Other cervical disc degeneration, unspecified cervical region: Secondary | ICD-10-CM | POA: Diagnosis not present

## 2022-12-14 DIAGNOSIS — C7951 Secondary malignant neoplasm of bone: Secondary | ICD-10-CM | POA: Diagnosis not present

## 2022-12-14 DIAGNOSIS — M4150 Other secondary scoliosis, site unspecified: Secondary | ICD-10-CM | POA: Diagnosis not present

## 2022-12-14 DIAGNOSIS — Z87891 Personal history of nicotine dependence: Secondary | ICD-10-CM | POA: Diagnosis not present

## 2022-12-17 NOTE — Progress Notes (Unsigned)
Palliative Medicine [facility]  [phone] [phone]   Name: BIANKA LIBERATI Date: 12/17/2022 MRN: [id number]  DOB: Aug 14, 1956  Patient Care Team: Donita Brooks, MD as PCP - General (Family Medicine) Glenna Fellows, MD (Inactive) as Consulting Physician (General Surgery) Cherlyn Roberts, MD as Consulting Physician (Dermatology) Dillingham, Alena Bills, DO as Attending Physician (Plastic Surgery) Loa Socks, NP as Nurse Practitioner (Hematology and Oncology) Erroll Luna, [facility] as Pharmacist (Pharmacist) Rachel Moulds, MD as Medical Oncologist (Hematology and Oncology) Pickenpack-Cousar, Arty Baumgartner, NP as Nurse Practitioner (Nurse Practitioner)   I connected with Barron Alvine on 12/17/22 at  3:00 PM EDT by phone and verified that I am speaking with the correct person using two identifiers.   I discussed the limitations, risks, security and privacy concerns of performing an evaluation and management service by telemedicine and the availability of in-person appointments. I also discussed with the patient that there may be a patient responsible charge related to this service. The patient expressed understanding and agreed to proceed.   Other persons participating in the visit and their role in the encounter: n/a   Patient's location: home  Provider's location: [facility]   Chief Complaint: f/u of symptom management   INTERVAL HISTORY: Krystal Brooks is a 67 y.o. female with oncologic medical history including recurrent estrogen receptor positive breast cancer s/p bilateral mastectomies and radiation (12/2015). 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 Palliative ask to see for symptom and pain management and goals of care.   SOCIAL HISTORY:     reports that she has quit smoking. Her smoking use included cigarettes. She has a 20.00 pack-year smoking history. She has never used smokeless tobacco. She  reports current alcohol use. She reports that she does not use drugs.  ADVANCE DIRECTIVES:  None on file  CODE STATUS: Full code  PAST MEDICAL HISTORY: Past Medical History:  Diagnosis Date   Adie's pupil    LEFT EYE   Allergy    Asthma    Atherosclerosis of aorta (HCC)    bulge above renal arteries on mri 2016   Cancer (HCC) 2017   right breast ca   Cellulitis    chest   Complication of anesthesia    VERY HARD TO WAKE UP     Eczema    Hypertension    Personal history of radiation therapy    PONV (postoperative nausea and vomiting)    Smoker     ALLERGIES:  is allergic to gabapentin, penicillins, sulfa antibiotics, and levaquin [levofloxacin].  MEDICATIONS:  Current Outpatient Medications  Medication Sig Dispense Refill   albuterol (PROVENTIL) (2.5 MG/3ML) 0.083% nebulizer solution TAKE 3 MLS (2.5 MG TOTAL) BY NEBULIZATION EVERY 6 HOURS AS NEEDED WHEEZING OR SHORTNESS OF BREATH. (Patient taking differently: Take 2.5 mg by nebulization every 6 (six) hours as needed for wheezing or shortness of breath.) 300 mL 5   albuterol (VENTOLIN HFA) 108 (90 Base) MCG/ACT inhaler INHALE 1-2 PUFFS EVERY 6 HOURS AS NEEDED FOR WHEEZING. (Patient taking differently: Inhale 1-2 puffs into the lungs every 6 (six) hours as needed for wheezing. INHALE 1-2 PUFFS EVERY 6 HOURS AS NEEDED FOR WHEEZING.) 18 each 5   anastrozole (ARIMIDEX) 1 MG tablet Take 1 tablet (1 mg total) by mouth daily. (Patient taking differently: Take 1 mg by mouth at bedtime.) 90 tablet 3   budesonide-formoterol (SYMBICORT) 80-4.5 MCG/ACT inhaler Inhale 2 puffs into the lungs 2 (two) times daily as  needed ("for flares"). 1 each 3   capecitabine (XELODA) 500 MG tablet Take 2 tablets (1,000 mg total) by mouth 2 (two) times daily after a meal. Take on days 1-14. Repeat every 21 days. 56 tablet 4   escitalopram (LEXAPRO) 10 MG tablet TAKE 1 TABLET BY MOUTH EVERY DAY 30 tablet 0   feeding supplement (ENSURE ENLIVE / ENSURE PLUS)  LIQD Take 237 mLs by mouth 2 (two) times daily between meals. 10000 mL 0   fluticasone-salmeterol (ADVAIR HFA) 115-21 MCG/ACT inhaler Inhale 2 puffs into the lungs 2 (two) times daily. (Patient not taking: Reported on 09/11/2022) 1 each 12   magnesium oxide (MAG-OX) 400 (240 Mg) MG tablet Take 1 tablet (400 mg total) by mouth 2 (two) times daily. 6 tablet 0   montelukast (SINGULAIR) 10 MG tablet Take 1 tablet (10 mg total) by mouth daily. (Patient taking differently: Take 10 mg by mouth at bedtime.) 30 tablet 11   Multiple Vitamin (MULTIVITAMIN WITH MINERALS) TABS tablet Take 1 tablet by mouth daily. 60 tablet 0   potassium chloride (KLOR-CON M10) 10 MEQ tablet Take 1 tablet (10 mEq total) by mouth daily. 30 tablet 1   prochlorperazine (COMPAZINE) 10 MG tablet Take 1 tablet (10 mg total) by mouth every 6 (six) hours as needed for nausea or vomiting. 30 tablet 1   promethazine (PHENERGAN) 12.5 MG tablet Take 1 tablet (12.5 mg total) by mouth every 6 (six) hours as needed for nausea or vomiting. 90 tablet 0   traMADol (ULTRAM) 50 MG tablet Take 1 tablet (50 mg total) by mouth every 8 (eight) hours as needed. (Patient taking differently: Take 50 mg by mouth every 8 (eight) hours as needed (for pain).) 45 tablet 0   No current facility-administered medications for this visit.    VITAL SIGNS: There were no vitals taken for this visit. There were no vitals filed for this visit.  Estimated body mass index is 20.84 kg/m as calculated from the following:   Height as of 10/18/22:  (1.549 m).   Weight as of 10/18/22: 110 lb 4.8 oz (50 kg).  Assessment NAD RRR Normal breathing pattern A&O x4 Right eye/facial bruising s/p dental procedure   PERFORMANCE STATUS (ECOG) : 1 - Symptomatic but completely ambulatory  IMPRESSION:   I discussed the importance of continued conversation with family and their medical providers regarding overall plan of care and treatment options, ensuring decisions are  within the context of the patients values and GOCs.  PLAN:  Promethazine as needed for nausea Compazine as needed for nausea Tramadol as needed for moderate to severe pain.  Patient is not taking daily. Understands we are available for close symptom management support. I will plan to see patient back in 2-4 weeks in collaboration to other oncology appointments.    Patient expressed understanding and was in agreement with this plan. She also understands that She can call the clinic at any time with any questions, concerns, or complaints.   Time Total: 20 min   Visit consisted of counseling and education dealing with the complex and emotionally intense issues of symptom management and palliative care in the setting of serious and potentially life-threatening illness.Greater than 50%  of this time was spent counseling and coordinating care related to the above assessment and plan.  Willette Alma, AGPCNP-BC  Palliative Medicine Team/[facility]

## 2022-12-20 ENCOUNTER — Inpatient Hospital Stay: Payer: Medicare HMO | Attending: Hematology and Oncology | Admitting: Nurse Practitioner

## 2022-12-20 ENCOUNTER — Encounter: Payer: Self-pay | Admitting: Nurse Practitioner

## 2022-12-20 DIAGNOSIS — Z515 Encounter for palliative care: Secondary | ICD-10-CM | POA: Diagnosis not present

## 2022-12-20 DIAGNOSIS — R530 Neoplastic (malignant) related fatigue: Secondary | ICD-10-CM | POA: Diagnosis not present

## 2022-12-23 ENCOUNTER — Other Ambulatory Visit (HOSPITAL_COMMUNITY): Payer: Self-pay

## 2022-12-30 ENCOUNTER — Other Ambulatory Visit (HOSPITAL_COMMUNITY): Payer: Self-pay

## 2022-12-30 ENCOUNTER — Other Ambulatory Visit: Payer: Self-pay | Admitting: Hematology and Oncology

## 2022-12-30 ENCOUNTER — Other Ambulatory Visit: Payer: Self-pay

## 2022-12-30 DIAGNOSIS — C50311 Malignant neoplasm of lower-inner quadrant of right female breast: Secondary | ICD-10-CM

## 2022-12-30 MED ORDER — CAPECITABINE 500 MG PO TABS
750.0000 mg/m2 | ORAL_TABLET | Freq: Two times a day (BID) | ORAL | 4 refills | Status: DC
  Filled 2023-01-11: qty 56, 14d supply, fill #0
  Filled 2023-02-01: qty 56, 21d supply, fill #1
  Filled 2023-03-01 – 2023-03-09 (×3): qty 56, 21d supply, fill #2

## 2023-01-11 ENCOUNTER — Other Ambulatory Visit (HOSPITAL_COMMUNITY): Payer: Self-pay

## 2023-01-11 ENCOUNTER — Other Ambulatory Visit: Payer: Self-pay

## 2023-01-12 DIAGNOSIS — C50919 Malignant neoplasm of unspecified site of unspecified female breast: Secondary | ICD-10-CM | POA: Diagnosis not present

## 2023-01-12 DIAGNOSIS — J90 Pleural effusion, not elsewhere classified: Secondary | ICD-10-CM | POA: Diagnosis not present

## 2023-01-12 DIAGNOSIS — C7951 Secondary malignant neoplasm of bone: Secondary | ICD-10-CM | POA: Diagnosis not present

## 2023-01-12 DIAGNOSIS — Z170 Estrogen receptor positive status [ER+]: Secondary | ICD-10-CM | POA: Diagnosis not present

## 2023-01-12 DIAGNOSIS — C787 Secondary malignant neoplasm of liver and intrahepatic bile duct: Secondary | ICD-10-CM | POA: Diagnosis not present

## 2023-01-12 DIAGNOSIS — C50312 Malignant neoplasm of lower-inner quadrant of left female breast: Secondary | ICD-10-CM | POA: Diagnosis not present

## 2023-01-17 DIAGNOSIS — C50919 Malignant neoplasm of unspecified site of unspecified female breast: Secondary | ICD-10-CM | POA: Diagnosis not present

## 2023-01-17 DIAGNOSIS — C787 Secondary malignant neoplasm of liver and intrahepatic bile duct: Secondary | ICD-10-CM | POA: Diagnosis not present

## 2023-01-18 DIAGNOSIS — C50911 Malignant neoplasm of unspecified site of right female breast: Secondary | ICD-10-CM | POA: Diagnosis not present

## 2023-01-18 DIAGNOSIS — Z170 Estrogen receptor positive status [ER+]: Secondary | ICD-10-CM | POA: Diagnosis not present

## 2023-01-19 DIAGNOSIS — C787 Secondary malignant neoplasm of liver and intrahepatic bile duct: Secondary | ICD-10-CM | POA: Diagnosis not present

## 2023-01-19 DIAGNOSIS — C50312 Malignant neoplasm of lower-inner quadrant of left female breast: Secondary | ICD-10-CM | POA: Diagnosis not present

## 2023-01-25 ENCOUNTER — Other Ambulatory Visit: Payer: Self-pay

## 2023-01-27 ENCOUNTER — Other Ambulatory Visit (HOSPITAL_COMMUNITY): Payer: Self-pay

## 2023-01-27 ENCOUNTER — Other Ambulatory Visit: Payer: Self-pay | Admitting: Family Medicine

## 2023-01-28 NOTE — Telephone Encounter (Signed)
Requested Prescriptions  Pending Prescriptions Disp Refills   albuterol (VENTOLIN HFA) 108 (90 Base) MCG/ACT inhaler [Pharmacy Med Name: ALBUTEROL HFA (VENTOLIN) INH] 18 each 2    Sig: INHALE 1-2 PUFFS EVERY 6 HOURS AS NEEDED FOR WHEEZING.     Pulmonology:  Beta Agonists 2 Failed - 01/27/2023 11:57 AM      Failed - Valid encounter within last 12 months    Recent Outpatient Visits           1 year ago Hypomagnesemia   [facility] Pickard, Priscille Heidelberg, MD   1 year ago History of breast cancer in female   [facility] Pickard, Priscille Heidelberg, MD   2 years ago History of breast cancer in female   [facility] Tanya Nones, Priscille Heidelberg, MD   3 years ago Exacerbation of asthma, unspecified asthma severity, unspecified whether persistent   [facility] Jenne Pane, Crystal A, FNP   3 years ago Exacerbation of asthma, unspecified asthma severity, unspecified whether persistent   [facility] Jenne Pane, Crystal A, FNP              Passed - Last BP in normal range    BP Readings from Last 1 Encounters:  10/18/22 131/62         Passed - Last Heart Rate in normal range    Pulse Readings from Last 1 Encounters:  10/18/22 99

## 2023-02-01 ENCOUNTER — Other Ambulatory Visit (HOSPITAL_COMMUNITY): Payer: Self-pay

## 2023-02-01 ENCOUNTER — Other Ambulatory Visit: Payer: Self-pay

## 2023-02-09 ENCOUNTER — Other Ambulatory Visit: Payer: Self-pay | Admitting: Family Medicine

## 2023-02-10 ENCOUNTER — Ambulatory Visit (INDEPENDENT_AMBULATORY_CARE_PROVIDER_SITE_OTHER): Payer: Medicare HMO

## 2023-02-10 ENCOUNTER — Other Ambulatory Visit (HOSPITAL_COMMUNITY): Payer: Self-pay

## 2023-02-10 VITALS — Ht 61.0 in | Wt 115.0 lb

## 2023-02-10 DIAGNOSIS — Z0000 Encounter for general adult medical examination without abnormal findings: Secondary | ICD-10-CM | POA: Diagnosis not present

## 2023-02-10 NOTE — Progress Notes (Signed)
Subjective:   Krystal Brooks is a 67 y.o. female who presents for Medicare Annual (Subsequent) preventive examination.  I connected with  Barron Alvine on 02/10/23 by a audio enabled telemedicine application and verified that I am speaking with the correct person using two identifiers.  Patient Location: Home  Provider Location: Home Office  I discussed the limitations of evaluation and management by telemedicine. The patient expressed understanding and agreed to proceed.  Review of Systems     Cardiac Risk Factors include: advanced age (>65men, >98 women);dyslipidemia;hypertension     Objective:    Today's Vitals   02/10/23 0906  Weight: 115 lb (52.2 kg)  Height: 5\' 1"  (1.549 m)   Body mass index is 21.73 kg/m.     02/10/2023    9:17 AM 09/21/2022   11:30 AM 09/10/2022    7:21 PM 05/31/2022    1:01 PM 01/22/2022    9:05 AM 07/29/2021   11:47 AM 03/13/2021    8:40 AM  Advanced Directives  Does Patient Have a Medical Advance Directive? Yes No No Yes Yes No Yes  Type of Estate agent of [place];Living will   Healthcare Power of [place];Living will Healthcare Power of [place];Living will    Does patient want to make changes to medical advance directive?    No - Patient declined     Copy of Healthcare Power of Attorney in Chart? No - copy requested   No - copy requested No - copy requested    Would patient like information on creating a medical advance directive?  No - Patient declined No - Patient declined  No - Patient declined      Current Medications (verified) Outpatient Encounter Medications as of 02/10/2023  Medication Sig   albuterol (PROVENTIL) (2.5 MG/3ML) 0.083% nebulizer solution TAKE 3 MLS (2.5 MG TOTAL) BY NEBULIZATION EVERY 6 HOURS AS NEEDED WHEEZING OR SHORTNESS OF BREATH. (Patient taking differently: Take 2.5 mg by nebulization every 6 (six) hours as needed for wheezing or shortness of breath.)   albuterol (VENTOLIN HFA) 108 (90 Base)  MCG/ACT inhaler INHALE 1-2 PUFFS EVERY 6 HOURS AS NEEDED FOR WHEEZING.   anastrozole (ARIMIDEX) 1 MG tablet Take 1 tablet (1 mg total) by mouth daily. (Patient taking differently: Take 1 mg by mouth at bedtime.)   budesonide-formoterol (SYMBICORT) 80-4.5 MCG/ACT inhaler Inhale 2 puffs into the lungs 2 (two) times daily as needed ("for flares").   capecitabine (XELODA) 500 MG tablet Take 2 tablets (1,000 mg total) by mouth 2 (two) times daily after a meal. Take on days 1-14. Repeat every 21 days.   escitalopram (LEXAPRO) 10 MG tablet TAKE 1 TABLET BY MOUTH EVERY DAY   feeding supplement (ENSURE ENLIVE / ENSURE PLUS) LIQD Take 237 mLs by mouth 2 (two) times daily between meals.   fluticasone-salmeterol (ADVAIR HFA) 115-21 MCG/ACT inhaler Inhale 2 puffs into the lungs 2 (two) times daily. (Patient not taking: Reported on 09/11/2022)   magnesium oxide (MAG-OX) 400 (240 Mg) MG tablet Take 1 tablet (400 mg total) by mouth 2 (two) times daily.   montelukast (SINGULAIR) 10 MG tablet Take 1 tablet (10 mg total) by mouth at bedtime.   Multiple Vitamin (MULTIVITAMIN WITH MINERALS) TABS tablet Take 1 tablet by mouth daily.   potassium chloride (KLOR-CON M10) 10 MEQ tablet Take 1 tablet (10 mEq total) by mouth daily.   prochlorperazine (COMPAZINE) 10 MG tablet Take 1 tablet (10 mg total) by mouth every 6 (six) hours as needed for  nausea or vomiting.   promethazine (PHENERGAN) 12.5 MG tablet Take 1 tablet (12.5 mg total) by mouth every 6 (six) hours as needed for nausea or vomiting.   traMADol (ULTRAM) 50 MG tablet Take 1 tablet (50 mg total) by mouth every 8 (eight) hours as needed. (Patient taking differently: Take 50 mg by mouth every 8 (eight) hours as needed (for pain).)   No facility-administered encounter medications on file as of 02/10/2023.    Allergies (verified) Gabapentin, Penicillins, Sulfa antibiotics, and Levaquin [levofloxacin]   History: Past Medical History:  Diagnosis Date   Adie's pupil     LEFT EYE   Allergy    Asthma    Atherosclerosis of aorta (HCC)    bulge above renal arteries on mri 2016   Cancer (HCC) 2017   right breast ca   Cellulitis    chest   Complication of anesthesia    VERY HARD TO WAKE UP     Eczema    Hypertension    Personal history of radiation therapy    PONV (postoperative nausea and vomiting)    Smoker    Past Surgical History:  Procedure Laterality Date   ABDOMINAL HYSTERECTOMY     BREAST BIOPSY Right 01/02/2016   BREAST EXCISIONAL BIOPSY Right 1979   BREAST IMPLANT REMOVAL Bilateral 10/05/2017   Procedure: REMOVALOF BILATERAL BREAST IMPLANTS;  Surgeon: Peggye Form, DO;  Location: [facility];  Service: Plastics;  Laterality: Bilateral;   BREAST LUMPECTOMY Right 01/27/2016   BREAST LUMPECTOMY WITH RADIOACTIVE SEED AND SENTINEL LYMPH NODE BIOPSY Right 01/27/2016   Procedure: BREAST LUMPECTOMY WITH RADIOACTIVE SEED AND SENTINEL LYMPH NODE BIOPSY;  Surgeon: Glenna Fellows, MD;  Location: [facility];  Service: General;  Laterality: Right;   BREAST RECONSTRUCTION WITH PLACEMENT OF TISSUE EXPANDER AND FLEX HD (ACELLULAR HYDRATED DERMIS) Bilateral 03/16/2017   Procedure: BILATERAL IMMEDIATE BREAST RECONSTRUCTION WITH PLACEMENT OF TISSUE EXPANDER AND FLEX HD (ACELLULAR HYDRATED DERMIS);  Surgeon: Peggye Form, DO;  Location: [facility];  Service: Plastics;  Laterality: Bilateral;   BREAST SURGERY  1979   cyst removed rt breast   CATARACT EXTRACTION W/ INTRAOCULAR LENS IMPLANT     RT EYE   CESAREAN SECTION     x2   EXCISION OF BREAST LESION Left 01/30/2018   Procedure: EXCISION OF LEFT BREAST SCAR;  Surgeon: Peggye Form, DO;  Location: [facility];  Service: Plastics;  Laterality: Left;   LATISSIMUS FLAP TO BREAST Right 01/30/2018   Procedure: RIGHT LATISSIMUS MYOCUTANEOUS FLAP FOR RIGHT CHEST WALL RECONSTRUCTION;  Surgeon: Peggye Form, DO;  Location: [facility];  Service: Plastics;  Laterality: Right;    MASTECTOMY W/ SENTINEL NODE BIOPSY Bilateral 03/16/2017   Procedure: BILATERAL TOTAL MASTECTOMY WITH LEFT SENTINEL LYMPH NODE BIOPSY;  Surgeon: Glenna Fellows, MD;  Location: [facility];  Service: General;  Laterality: Bilateral;   REMOVAL OF BILATERAL TISSUE EXPANDERS WITH PLACEMENT OF BILATERAL BREAST IMPLANTS Bilateral 06/15/2017   Procedure: REMOVAL OF BILATERAL TISSUE EXPANDERS WITH PLACEMENT OF BILATERAL BREAST IMPLANTS;  Surgeon: Peggye Form, DO;  Location: [facility];  Service: Plastics;  Laterality: Bilateral;   TOTAL MASTECTOMY Bilateral 03/16/2017   SENTINAL NODE BIOPSY   Family History  Problem Relation Age of Onset   Hearing loss Mother    Asthma Father    Cancer Father        melanoma, metastases    Diabetes Father    Heart disease Father    Early death  Sister    Early death Paternal Grandfather    Cancer Maternal Grandmother 66       colon cancer    Breast cancer Neg Hx    Ovarian cancer Neg Hx    Social History   Socioeconomic History   Marital status: Married    Spouse name: Not on file   Number of children: Not on file   Years of education: Not on file   Highest education level: Not on file  Occupational History   Not on file  Tobacco Use   Smoking status: Former    Packs/day: 0.50    Years: 40.00    Additional pack years: 0.00    Total pack years: 20.00    Types: Cigarettes   Smokeless tobacco: Never  Vaping Use   Vaping Use: Never used  Substance and Sexual Activity   Alcohol use: Yes    Alcohol/week: 0.0 standard drinks of alcohol    Comment: social   Drug use: No   Sexual activity: Yes    Birth control/protection: Surgical    Comment: hysterectomy  Other Topics Concern   Not on file  Social History Narrative   Three story home   Drinks caffeine   Right handed   Social Determinants of Health   Financial Resource Strain: Low Risk  (02/10/2023)   Overall Financial Resource Strain (CARDIA)    Difficulty of Paying  Living Expenses: Not hard at all  Food Insecurity: No Food Insecurity (02/10/2023)   Hunger Vital Sign    Worried About Running Out of Food in the Last Year: Never true    Ran Out of Food in the Last Year: Never true  Transportation Needs: No Transportation Needs (02/10/2023)   PRAPARE - Administrator, Civil Service (Medical): No    Lack of Transportation (Non-Medical): No  Physical Activity: Insufficiently Active (02/10/2023)   Exercise Vital Sign    Days of Exercise per Week: 3 days    Minutes of Exercise per Session: 30 min  Stress: No Stress Concern Present (02/10/2023)   [employer] of Occupational Health - Occupational Stress Questionnaire    Feeling of Stress : Not at all  Social Connections: Socially Integrated (02/10/2023)   Social Connection and Isolation Panel [NHANES]    Frequency of Communication with Friends and Family: More than three times a week    Frequency of Social Gatherings with Friends and Family: Three times a week    Attends Religious Services: More than 4 times per year    Active Member of Clubs or Organizations: Yes    Attends Engineer, structural: More than 4 times per year    Marital Status: Married    Tobacco Counseling Counseling given: Not Answered   Clinical Intake:  Pre-visit preparation completed: Yes  Pain : No/denies pain  Diabetes: No  How often do you need to have someone help you when you read instructions, pamphlets, or other written materials from your doctor or pharmacy?: 1 - Never  Diabetic?No   Interpreter Needed?: No  Information entered by :: Kandis Fantasia LPN   Activities of Daily Living    02/10/2023    9:16 AM 09/10/2022    7:21 PM  In your present state of health, do you have any difficulty performing the following activities:  Hearing? 0 0  Vision? 0 0  Difficulty concentrating or making decisions? 0 0  Walking or climbing stairs? 0 1  Dressing or bathing? 0 0  Doing errands,  shopping? 0  0  Preparing Food and eating ? N   Using the Toilet? N   In the past six months, have you accidently leaked urine? N   Do you have problems with loss of bowel control? N   Managing your Medications? N   Managing your Finances? N   Housekeeping or managing your Housekeeping? N     Patient Care Team: Donita Brooks, MD as PCP - General (Family Medicine) Glenna Fellows, MD (Inactive) as Consulting Physician (General Surgery) Cherlyn Roberts, MD as Consulting Physician (Dermatology) Dillingham, Alena Bills, DO as Attending Physician (Plastic Surgery) Axel Filler Larna Daughters, NP as Nurse Practitioner (Hematology and Oncology) Erroll Luna, [facility] as Pharmacist (Pharmacist) Rachel Moulds, MD as Medical Oncologist (Hematology and Oncology) Pickenpack-Cousar, Arty Baumgartner, NP as Nurse Practitioner (Nurse Practitioner)  Indicate any recent Medical Services you may have received from other than [facility] providers in the past year (date may be approximate).     Assessment:   This is a routine wellness examination for [facility].  Hearing/Vision screen Hearing Screening - Comments:: Denies hearing difficulties   Vision Screening - Comments:: No vision problems; will schedule routine eye exam soon    Dietary issues and exercise activities discussed: Current Exercise Habits: Home exercise routine, Type of exercise: walking, Time (Minutes): 30, Frequency (Times/Week): 3, Weekly Exercise (Minutes/Week): 90, Intensity: Mild   Goals Addressed             This Visit's Progress    COMPLETED: Manage My Medicine       Timeframe:  Long-Range Goal Priority:  High Start Date: 02/13/21                            Expected End Date:  08/15/21                     Follow Up Date 05/29/21    - call for medicine refill 2 or 3 days before it runs out - call if I am sick and can't take my medicine - keep a list of all the medicines I take; vitamins and herbals too    Why is this important?   These  steps will help you keep on track with your medicines.   Notes:      Remain active and independent        Depression Screen    02/10/2023    9:12 AM 06/25/2022    1:57 PM 05/11/2022    8:01 AM 01/22/2022    9:01 AM 01/01/2021   11:27 AM 07/14/2020   10:30 AM 04/07/2018    9:15 AM  PHQ 2/9 Scores  PHQ - 2 Score 0 0 0 0 0 0 0    Fall Risk    02/10/2023    9:16 AM 06/25/2022    1:57 PM 05/11/2022    8:01 AM 01/22/2022    9:06 AM 03/13/2021    8:40 AM  Fall Risk   Falls in the past year? 0 0 0 0 1  Number falls in past yr: 0 0 0 0 0  Injury with Fall? 0 0 0 0 0  Risk for fall due to : No Fall Risks No Fall Risks No Fall Risks No Fall Risks   Follow up Falls prevention discussed;Education provided;Falls evaluation completed Falls prevention discussed Falls prevention discussed Falls prevention discussed     FALL RISK PREVENTION PERTAINING TO THE HOME:  Any stairs in or  around the home? Yes  If so, are there any without handrails? No  Home free of loose throw rugs in walkways, pet beds, electrical cords, etc? Yes  Adequate lighting in your home to reduce risk of falls? Yes   ASSISTIVE DEVICES UTILIZED TO PREVENT FALLS:  Life alert? No  Use of a cane, walker or w/c? No  Grab bars in the bathroom? Yes  Shower chair or bench in shower? No  Elevated toilet seat or a handicapped toilet? Yes   TIMED UP AND GO:  Was the test performed? No . Telephonic visit   Cognitive Function:        02/10/2023    9:17 AM 01/22/2022    9:08 AM 07/14/2020   10:31 AM  6CIT Screen  What Year? 0 points 0 points 0 points  What month? 0 points 0 points 0 points  What time? 0 points 0 points 0 points  Count back from 20 0 points 0 points 0 points  Months in reverse 0 points 0 points 0 points  Repeat phrase 0 points 0 points 0 points  Total Score 0 points 0 points 0 points    Immunizations Immunization History  Administered Date(s) Administered   Influenza-Unspecified 04/28/2017   PFIZER  Comirnaty(Gray Top)Covid-19 Tri-Sucrose Vaccine 07/15/2021   PFIZER(Purple Top)SARS-COV-2 Vaccination 06/10/2020    TDAP status: Due, Education has been provided regarding the importance of this vaccine. Advised may receive this vaccine at local pharmacy or Health Dept. Aware to provide a copy of the vaccination record if obtained from local pharmacy or Health Dept. Verbalized acceptance and understanding.  Pneumococcal vaccine status: Declined,  Education has been provided regarding the importance of this vaccine but patient still declined. Advised may receive this vaccine at local pharmacy or Health Dept. Aware to provide a copy of the vaccination record if obtained from local pharmacy or Health Dept. Verbalized acceptance and understanding.   Covid-19 vaccine status: Declined, Education has been provided regarding the importance of this vaccine but patient still declined. Advised may receive this vaccine at local pharmacy or Health Dept.or vaccine clinic. Aware to provide a copy of the vaccination record if obtained from local pharmacy or Health Dept. Verbalized acceptance and understanding.  Qualifies for Shingles Vaccine? Yes   Zostavax completed No   Shingrix Completed?: No.    Education has been provided regarding the importance of this vaccine. Patient has been advised to call insurance company to determine out of pocket expense if they have not yet received this vaccine. Advised may also receive vaccine at local pharmacy or Health Dept. Verbalized acceptance and understanding.  Screening Tests Health Maintenance  Topic Date Due   DTaP/Tdap/Td (1 - Tdap) Never done   Zoster Vaccines- Shingrix (1 of 2) 05/13/2023 (Originally 12/21/1974)   Pneumonia Vaccine 19+ Years old (1 of 2 - PCV) 02/10/2024 (Originally 12/21/1961)   Colonoscopy  02/10/2024 (Originally 08/30/2022)   INFLUENZA VACCINE  03/31/2023   Lung Cancer Screening  11/10/2023   DEXA SCAN  Completed   Hepatitis C Screening   Completed   HPV VACCINES  Aged Out   MAMMOGRAM  Discontinued   COVID-19 Vaccine  Discontinued    Health Maintenance  Health Maintenance Due  Topic Date Due   DTaP/Tdap/Td (1 - Tdap) Never done    Colorectal cancer screening:  Patient declines at this time  Mammogram status: No longer required due to bilateral mastectomy .  Bone Density status: Completed 05/24/16. Results reflect: Bone density results: NORMAL. Repeat every 5  years.  Lung Cancer Screening: (Low Dose CT Chest recommended if Age 49-80 years, 30 pack-year currently smoking OR have quit w/in 15years.) does qualify.   Lung Cancer Screening Referral: 11/10/22  Additional Screening:  Hepatitis C Screening: does qualify; Completed 01/21/21  Vision Screening: Recommended annual ophthalmology exams for early detection of glaucoma and other disorders of the eye. Is the patient up to date with their annual eye exam?  No  Who is the provider or what is the name of the office in which the patient attends annual eye exams? none If pt is not established with a provider, would they like to be referred to a provider to establish care? No .   Dental Screening: Recommended annual dental exams for proper oral hygiene  Community Resource Referral / Chronic Care Management: CRR required this visit?  No   CCM required this visit?  No      Plan:     I have personally reviewed and noted the following in the patient's chart:   Medical and social history Use of alcohol, tobacco or illicit drugs  Current medications and supplements including opioid prescriptions. Patient is not currently taking opioid prescriptions. Functional ability and status Nutritional status Physical activity Advanced directives List of other physicians Hospitalizations, surgeries, and ER visits in previous 12 months Vitals Screenings to include cognitive, depression, and falls Referrals and appointments  In addition, I have reviewed and discussed with  patient certain preventive protocols, quality metrics, and best practice recommendations. A written personalized care plan for preventive services as well as general preventive health recommendations were provided to patient.     Durwin Nora, [place]   1/61/0960   Due to this being a virtual visit, the after visit summary with patients personalized plan was offered to patient via mail or my-chart. Patient would like to access on my-chart  Nurse Notes: Patient stopped blood pressure medications in February at advise of Oncologist.  Is monitoring blood pressure at home daily.

## 2023-02-10 NOTE — Patient Instructions (Signed)
Krystal Brooks , Thank you for taking time to come for your Medicare Wellness Visit. I appreciate your ongoing commitment to your health goals. Please review the following plan we discussed and let me know if I can assist you in the future.   These are the goals we discussed:  Goals      Exercise 150 min/wk Moderate Activity     Remain active and independent        This is a list of the screening recommended for you and due dates:  Health Maintenance  Topic Date Due   DTaP/Tdap/Td vaccine (1 - Tdap) Never done   Zoster (Shingles) Vaccine (1 of 2) 05/13/2023*   Pneumonia Vaccine (1 of 2 - PCV) 02/10/2024*   Colon Cancer Screening  02/10/2024*   Flu Shot  03/31/2023   Screening for Lung Cancer  11/10/2023   DEXA scan (bone density measurement)  Completed   Hepatitis C Screening  Completed   HPV Vaccine  Aged Out   Mammogram  Discontinued   COVID-19 Vaccine  Discontinued  *Topic was postponed. The date shown is not the original due date.    Advanced directives: Information on Advanced Care Planning can be found at [facility] Advance Health Care Directives Advance Health Care Directives ([email or web address] Please bring a copy of your health care power of attorney and living will to the office to be added to your chart at your convenience.  Conditions/risks identified: Aim for 30 minutes of exercise or brisk walking, 6-8 glasses of water, and 5 servings of fruits and vegetables each day.  Next appointment: Follow up in one year for your annual wellness visit    Preventive Care 65 Years and Older, Female Preventive care refers to lifestyle choices and visits with your health care provider that can promote health and wellness. What does preventive care include? A yearly physical exam. This is also called an annual well check. Dental exams once or twice a year. Routine eye exams. Ask your health care provider how often you should have your eyes checked. Personal lifestyle  choices, including: Daily care of your teeth and gums. Regular physical activity. Eating a healthy diet. Avoiding tobacco and drug use. Limiting alcohol use. Practicing safe sex. Taking low-dose aspirin every day. Taking vitamin and mineral supplements as recommended by your health care provider. What happens during an annual well check? The services and screenings done by your health care provider during your annual well check will depend on your age, overall health, lifestyle risk factors, and family history of disease. Counseling  Your health care provider may ask you questions about your: Alcohol use. Tobacco use. Drug use. Emotional well-being. Home and relationship well-being. Sexual activity. Eating habits. History of falls. Memory and ability to understand (cognition). Work and work Astronomer. Reproductive health. Screening  You may have the following tests or measurements: Height, weight, and BMI. Blood pressure. Lipid and cholesterol levels. These may be checked every 5 years, or more frequently if you are over 74 years old. Skin check. Lung cancer screening. You may have this screening every year starting at age 43 if you have a 30-pack-year history of smoking and currently smoke or have quit within the past 15 years. Fecal occult blood test (FOBT) of the stool. You may have this test every year starting at age 20. Flexible sigmoidoscopy or colonoscopy. You may have a sigmoidoscopy every 5 years or a colonoscopy every 10 years starting at age 4. Hepatitis C blood test. Hepatitis  B blood test. Sexually transmitted disease (STD) testing. Diabetes screening. This is done by checking your blood sugar (glucose) after you have not eaten for a while (fasting). You may have this done every 1-3 years. Bone density scan. This is done to screen for osteoporosis. You may have this done starting at age 86. Mammogram. This may be done every 1-2 years. Talk to your health care  provider about how often you should have regular mammograms. Talk with your health care provider about your test results, treatment options, and if necessary, the need for more tests. Vaccines  Your health care provider may recommend certain vaccines, such as: Influenza vaccine. This is recommended every year. Tetanus, diphtheria, and acellular pertussis (Tdap, Td) vaccine. You may need a Td booster every 10 years. Zoster vaccine. You may need this after age 59. Pneumococcal 13-valent conjugate (PCV13) vaccine. One dose is recommended after age 55. Pneumococcal polysaccharide (PPSV23) vaccine. One dose is recommended after age 20. Talk to your health care provider about which screenings and vaccines you need and how often you need them. This information is not intended to replace advice given to you by your health care provider. Make sure you discuss any questions you have with your health care provider. Document Released: 09/12/2015 Document Revised: 05/05/2016 Document Reviewed: 06/17/2015 Elsevier Interactive Patient Education  2017 [employer].  Fall Prevention in the Home Falls can cause injuries. They can happen to people of all ages. There are many things you can do to make your home safe and to help prevent falls. What can I do on the outside of my home? Regularly fix the edges of walkways and driveways and fix any cracks. Remove anything that might make you trip as you walk through a door, such as a raised step or threshold. Trim any bushes or trees on the path to your home. Use bright outdoor lighting. Clear any walking paths of anything that might make someone trip, such as rocks or tools. Regularly check to see if handrails are loose or broken. Make sure that both sides of any steps have handrails. Any raised decks and porches should have guardrails on the edges. Have any leaves, snow, or ice cleared regularly. Use sand or salt on walking paths during winter. Clean up any  spills in your garage right away. This includes oil or grease spills. What can I do in the bathroom? Use night lights. Install grab bars by the toilet and in the tub and shower. Do not use towel bars as grab bars. Use non-skid mats or decals in the tub or shower. If you need to sit down in the shower, use a plastic, non-slip stool. Keep the floor dry. Clean up any water that spills on the floor as soon as it happens. Remove soap buildup in the tub or shower regularly. Attach bath mats securely with double-sided non-slip rug tape. Do not have throw rugs and other things on the floor that can make you trip. What can I do in the bedroom? Use night lights. Make sure that you have a light by your bed that is easy to reach. Do not use any sheets or blankets that are too big for your bed. They should not hang down onto the floor. Have a firm chair that has side arms. You can use this for support while you get dressed. Do not have throw rugs and other things on the floor that can make you trip. What can I do in the kitchen? Clean up any  spills right away. Avoid walking on wet floors. Keep items that you use a lot in easy-to-reach places. If you need to reach something above you, use a strong step stool that has a grab bar. Keep electrical cords out of the way. Do not use floor polish or wax that makes floors slippery. If you must use wax, use non-skid floor wax. Do not have throw rugs and other things on the floor that can make you trip. What can I do with my stairs? Do not leave any items on the stairs. Make sure that there are handrails on both sides of the stairs and use them. Fix handrails that are broken or loose. Make sure that handrails are as long as the stairways. Check any carpeting to make sure that it is firmly attached to the stairs. Fix any carpet that is loose or worn. Avoid having throw rugs at the top or bottom of the stairs. If you do have throw rugs, attach them to the floor  with carpet tape. Make sure that you have a light switch at the top of the stairs and the bottom of the stairs. If you do not have them, ask someone to add them for you. What else can I do to help prevent falls? Wear shoes that: Do not have high heels. Have rubber bottoms. Are comfortable and fit you well. Are closed at the toe. Do not wear sandals. If you use a stepladder: Make sure that it is fully opened. Do not climb a closed stepladder. Make sure that both sides of the stepladder are locked into place. Ask someone to hold it for you, if possible. Clearly mark and make sure that you can see: Any grab bars or handrails. First and last steps. Where the edge of each step is. Use tools that help you move around (mobility aids) if they are needed. These include: Canes. Walkers. Scooters. Crutches. Turn on the lights when you go into a dark area. Replace any light bulbs as soon as they burn out. Set up your furniture so you have a clear path. Avoid moving your furniture around. If any of your floors are uneven, fix them. If there are any pets around you, be aware of where they are. Review your medicines with your doctor. Some medicines can make you feel dizzy. This can increase your chance of falling. Ask your doctor what other things that you can do to help prevent falls. This information is not intended to replace advice given to you by your health care provider. Make sure you discuss any questions you have with your health care provider. Document Released: 06/12/2009 Document Revised: 01/22/2016 Document Reviewed: 09/20/2014 Elsevier Interactive Patient Education  2017 [employer].

## 2023-02-11 ENCOUNTER — Other Ambulatory Visit (HOSPITAL_COMMUNITY): Payer: Self-pay

## 2023-02-17 ENCOUNTER — Other Ambulatory Visit (HOSPITAL_COMMUNITY): Payer: Self-pay

## 2023-02-28 DIAGNOSIS — C50312 Malignant neoplasm of lower-inner quadrant of left female breast: Secondary | ICD-10-CM | POA: Diagnosis not present

## 2023-02-28 DIAGNOSIS — B084 Enteroviral vesicular stomatitis with exanthem: Secondary | ICD-10-CM | POA: Diagnosis not present

## 2023-02-28 DIAGNOSIS — Z170 Estrogen receptor positive status [ER+]: Secondary | ICD-10-CM | POA: Diagnosis not present

## 2023-02-28 DIAGNOSIS — Z79899 Other long term (current) drug therapy: Secondary | ICD-10-CM | POA: Diagnosis not present

## 2023-02-28 DIAGNOSIS — R918 Other nonspecific abnormal finding of lung field: Secondary | ICD-10-CM | POA: Diagnosis not present

## 2023-02-28 DIAGNOSIS — Z515 Encounter for palliative care: Secondary | ICD-10-CM | POA: Diagnosis not present

## 2023-02-28 DIAGNOSIS — Z8639 Personal history of other endocrine, nutritional and metabolic disease: Secondary | ICD-10-CM | POA: Diagnosis not present

## 2023-02-28 DIAGNOSIS — C50919 Malignant neoplasm of unspecified site of unspecified female breast: Secondary | ICD-10-CM | POA: Diagnosis not present

## 2023-02-28 DIAGNOSIS — C50911 Malignant neoplasm of unspecified site of right female breast: Secondary | ICD-10-CM | POA: Diagnosis not present

## 2023-02-28 DIAGNOSIS — R110 Nausea: Secondary | ICD-10-CM | POA: Diagnosis not present

## 2023-02-28 DIAGNOSIS — C787 Secondary malignant neoplasm of liver and intrahepatic bile duct: Secondary | ICD-10-CM | POA: Diagnosis not present

## 2023-02-28 DIAGNOSIS — C7951 Secondary malignant neoplasm of bone: Secondary | ICD-10-CM | POA: Diagnosis not present

## 2023-03-01 ENCOUNTER — Other Ambulatory Visit: Payer: Self-pay

## 2023-03-01 ENCOUNTER — Other Ambulatory Visit (HOSPITAL_COMMUNITY): Payer: Self-pay

## 2023-03-09 ENCOUNTER — Other Ambulatory Visit (HOSPITAL_COMMUNITY): Payer: Self-pay

## 2023-03-09 ENCOUNTER — Other Ambulatory Visit: Payer: Self-pay

## 2023-03-24 ENCOUNTER — Other Ambulatory Visit: Payer: Self-pay

## 2023-03-28 DIAGNOSIS — G629 Polyneuropathy, unspecified: Secondary | ICD-10-CM | POA: Diagnosis not present

## 2023-03-28 DIAGNOSIS — L271 Localized skin eruption due to drugs and medicaments taken internally: Secondary | ICD-10-CM | POA: Diagnosis not present

## 2023-03-28 DIAGNOSIS — R1900 Intra-abdominal and pelvic swelling, mass and lump, unspecified site: Secondary | ICD-10-CM | POA: Diagnosis not present

## 2023-03-28 DIAGNOSIS — C7951 Secondary malignant neoplasm of bone: Secondary | ICD-10-CM | POA: Diagnosis not present

## 2023-03-28 DIAGNOSIS — Z8639 Personal history of other endocrine, nutritional and metabolic disease: Secondary | ICD-10-CM | POA: Diagnosis not present

## 2023-03-28 DIAGNOSIS — C787 Secondary malignant neoplasm of liver and intrahepatic bile duct: Secondary | ICD-10-CM | POA: Diagnosis not present

## 2023-03-28 DIAGNOSIS — Z87891 Personal history of nicotine dependence: Secondary | ICD-10-CM | POA: Diagnosis not present

## 2023-03-28 DIAGNOSIS — C50919 Malignant neoplasm of unspecified site of unspecified female breast: Secondary | ICD-10-CM | POA: Diagnosis not present

## 2023-03-28 DIAGNOSIS — C50312 Malignant neoplasm of lower-inner quadrant of left female breast: Secondary | ICD-10-CM | POA: Diagnosis not present

## 2023-03-28 DIAGNOSIS — Z170 Estrogen receptor positive status [ER+]: Secondary | ICD-10-CM | POA: Diagnosis not present

## 2023-03-30 ENCOUNTER — Other Ambulatory Visit: Payer: Self-pay

## 2023-03-30 ENCOUNTER — Other Ambulatory Visit (HOSPITAL_COMMUNITY): Payer: Self-pay

## 2023-03-31 ENCOUNTER — Other Ambulatory Visit: Payer: Self-pay

## 2023-03-31 ENCOUNTER — Other Ambulatory Visit (HOSPITAL_COMMUNITY): Payer: Self-pay

## 2023-03-31 MED ORDER — CAPECITABINE 500 MG PO TABS
ORAL_TABLET | ORAL | 11 refills | Status: AC
  Filled 2023-03-31: qty 28, 21d supply, fill #0
  Filled 2023-04-01: qty 28, 28d supply, fill #0
  Filled 2023-04-04: qty 28, 21d supply, fill #0
  Filled 2023-04-22: qty 28, 21d supply, fill #1
  Filled 2023-05-11: qty 28, 28d supply, fill #2
  Filled 2023-06-10: qty 28, 28d supply, fill #3
  Filled 2023-07-12: qty 28, 28d supply, fill #4
  Filled 2023-08-12: qty 28, 28d supply, fill #5
  Filled 2023-09-15: qty 28, 28d supply, fill #6
  Filled 2023-10-18: qty 28, 28d supply, fill #7
  Filled 2023-11-14: qty 28, 28d supply, fill #8
  Filled 2023-12-08: qty 28, 28d supply, fill #9
  Filled 2024-01-10: qty 28, 28d supply, fill #10
  Filled 2024-02-13: qty 28, 28d supply, fill #11

## 2023-04-01 ENCOUNTER — Other Ambulatory Visit: Payer: Self-pay

## 2023-04-04 ENCOUNTER — Other Ambulatory Visit (HOSPITAL_COMMUNITY): Payer: Self-pay

## 2023-04-21 ENCOUNTER — Other Ambulatory Visit (HOSPITAL_COMMUNITY): Payer: Self-pay

## 2023-04-22 ENCOUNTER — Other Ambulatory Visit (HOSPITAL_COMMUNITY): Payer: Self-pay

## 2023-04-23 ENCOUNTER — Other Ambulatory Visit (HOSPITAL_COMMUNITY): Payer: Self-pay

## 2023-05-03 DIAGNOSIS — C50919 Malignant neoplasm of unspecified site of unspecified female breast: Secondary | ICD-10-CM | POA: Diagnosis not present

## 2023-05-03 DIAGNOSIS — C787 Secondary malignant neoplasm of liver and intrahepatic bile duct: Secondary | ICD-10-CM | POA: Diagnosis not present

## 2023-05-05 ENCOUNTER — Ambulatory Visit (INDEPENDENT_AMBULATORY_CARE_PROVIDER_SITE_OTHER): Payer: Medicare HMO | Admitting: Family Medicine

## 2023-05-05 ENCOUNTER — Encounter: Payer: Self-pay | Admitting: Family Medicine

## 2023-05-05 VITALS — BP 100/68 | HR 92 | Temp 99.2°F | Ht 61.0 in | Wt 121.0 lb

## 2023-05-05 DIAGNOSIS — C50312 Malignant neoplasm of lower-inner quadrant of left female breast: Secondary | ICD-10-CM | POA: Diagnosis not present

## 2023-05-05 DIAGNOSIS — J0100 Acute maxillary sinusitis, unspecified: Secondary | ICD-10-CM

## 2023-05-05 DIAGNOSIS — C787 Secondary malignant neoplasm of liver and intrahepatic bile duct: Secondary | ICD-10-CM | POA: Diagnosis not present

## 2023-05-05 MED ORDER — AZITHROMYCIN 250 MG PO TABS: ORAL_TABLET | ORAL | 0 refills | Status: AC

## 2023-05-05 NOTE — Progress Notes (Signed)
Subjective:  HPI: Krystal Brooks is a 67 y.o. female presenting on 05/05/2023 for Follow-up (had COVID 3.5 weeks ago; tested neg 2.5 weeks ago; lingering head and chest congestion - JBG\\)   HPI Patient is in today for persistent head and chest congestion after having covid 3.5 weeks ago. Her symptoms have been mild since onset. She reports post nasal drip with sore throat. She also reports burning in her right sinuses and sinus pressure with mild loss of taste and smell. No rhinorrhea she can visualize, it is postnasal. She reports multiple antibiotic allergies including doxycycline intolerance. Antibiotic use limited to Azithromycin. Denies fever, chills, body aches. Has tried Mucinex and Zyrtec.  Review of Systems  All other systems reviewed and are negative.   Relevant past medical history reviewed and updated as indicated.   Past Medical History:  Diagnosis Date   Adie's pupil    LEFT EYE   Allergy    Asthma    Atherosclerosis of aorta (HCC)    bulge above renal arteries on mri 2016   Cancer (HCC) 2017   right breast ca   Cellulitis    chest   Complication of anesthesia    VERY HARD TO WAKE UP     Eczema    Hypertension    Personal history of radiation therapy    PONV (postoperative nausea and vomiting)    Smoker      Past Surgical History:  Procedure Laterality Date   ABDOMINAL HYSTERECTOMY     BREAST BIOPSY Right 01/02/2016   BREAST EXCISIONAL BIOPSY Right 1979   BREAST IMPLANT REMOVAL Bilateral 10/05/2017   Procedure: REMOVALOF BILATERAL BREAST IMPLANTS;  Surgeon: Peggye Form, DO;  Location: [facility];  Service: Plastics;  Laterality: Bilateral;   BREAST LUMPECTOMY Right 01/27/2016   BREAST LUMPECTOMY WITH RADIOACTIVE SEED AND SENTINEL LYMPH NODE BIOPSY Right 01/27/2016   Procedure: BREAST LUMPECTOMY WITH RADIOACTIVE SEED AND SENTINEL LYMPH NODE BIOPSY;  Surgeon: Glenna Fellows, MD;  Location: [facility];  Service:  General;  Laterality: Right;   BREAST RECONSTRUCTION WITH PLACEMENT OF TISSUE EXPANDER AND FLEX HD (ACELLULAR HYDRATED DERMIS) Bilateral 03/16/2017   Procedure: BILATERAL IMMEDIATE BREAST RECONSTRUCTION WITH PLACEMENT OF TISSUE EXPANDER AND FLEX HD (ACELLULAR HYDRATED DERMIS);  Surgeon: Peggye Form, DO;  Location: [facility];  Service: Plastics;  Laterality: Bilateral;   BREAST SURGERY  1979   cyst removed rt breast   CATARACT EXTRACTION W/ INTRAOCULAR LENS IMPLANT     RT EYE   CESAREAN SECTION     x2   EXCISION OF BREAST LESION Left 01/30/2018   Procedure: EXCISION OF LEFT BREAST SCAR;  Surgeon: Peggye Form, DO;  Location: [facility];  Service: Plastics;  Laterality: Left;   LATISSIMUS FLAP TO BREAST Right 01/30/2018   Procedure: RIGHT LATISSIMUS MYOCUTANEOUS FLAP FOR RIGHT CHEST WALL RECONSTRUCTION;  Surgeon: Peggye Form, DO;  Location: [facility];  Service: Plastics;  Laterality: Right;   MASTECTOMY W/ SENTINEL NODE BIOPSY Bilateral 03/16/2017   Procedure: BILATERAL TOTAL MASTECTOMY WITH LEFT SENTINEL LYMPH NODE BIOPSY;  Surgeon: Glenna Fellows, MD;  Location: [facility];  Service: General;  Laterality: Bilateral;   REMOVAL OF BILATERAL TISSUE EXPANDERS WITH PLACEMENT OF BILATERAL BREAST IMPLANTS Bilateral 06/15/2017   Procedure: REMOVAL OF BILATERAL TISSUE EXPANDERS WITH PLACEMENT OF BILATERAL BREAST IMPLANTS;  Surgeon: Peggye Form, DO;  Location: [facility];  Service: Plastics;  Laterality: Bilateral;   TOTAL MASTECTOMY Bilateral 03/16/2017   SENTINAL  NODE BIOPSY    Allergies and medications reviewed and updated.   Current Outpatient Medications:    albuterol (PROVENTIL) (2.5 MG/3ML) 0.083% nebulizer solution, TAKE 3 MLS (2.5 MG TOTAL) BY NEBULIZATION EVERY 6 HOURS AS NEEDED WHEEZING OR SHORTNESS OF BREATH. (Patient taking differently: Take 2.5 mg by nebulization every 6 (six) hours as needed for wheezing or shortness of breath.), Disp: 300 mL, Rfl: 5    albuterol (VENTOLIN HFA) 108 (90 Base) MCG/ACT inhaler, INHALE 1-2 PUFFS EVERY 6 HOURS AS NEEDED FOR WHEEZING., Disp: 18 each, Rfl: 2   budesonide-formoterol (SYMBICORT) 80-4.5 MCG/ACT inhaler, Inhale 2 puffs into the lungs 2 (two) times daily as needed ("for flares")., Disp: 1 each, Rfl: 3   capecitabine (XELODA) 500 MG tablet, Take 1 tablet (500 mg total) by mouth 2 (two) times daily Take 1 tablet (500mg ) twice a day by mouth, 7 days on, 7 days off; then repeat., Disp: 28 tablet, Rfl: 11   escitalopram (LEXAPRO) 10 MG tablet, TAKE 1 TABLET BY MOUTH EVERY DAY, Disp: 30 tablet, Rfl: 0   feeding supplement (ENSURE ENLIVE / ENSURE PLUS) LIQD, Take 237 mLs by mouth 2 (two) times daily between meals., Disp: 10000 mL, Rfl: 0   montelukast (SINGULAIR) 10 MG tablet, Take 1 tablet (10 mg total) by mouth at bedtime., Disp: 90 tablet, Rfl: 1   Multiple Vitamin (MULTIVITAMIN WITH MINERALS) TABS tablet, Take 1 tablet by mouth daily., Disp: 60 tablet, Rfl: 0   potassium chloride (KLOR-CON M10) 10 MEQ tablet, Take 1 tablet (10 mEq total) by mouth daily., Disp: 30 tablet, Rfl: 1   promethazine (PHENERGAN) 12.5 MG tablet, Take 1 tablet (12.5 mg total) by mouth every 6 (six) hours as needed for nausea or vomiting., Disp: 90 tablet, Rfl: 0   traMADol (ULTRAM) 50 MG tablet, Take 1 tablet (50 mg total) by mouth every 8 (eight) hours as needed. (Patient taking differently: Take 50 mg by mouth every 8 (eight) hours as needed (for pain).), Disp: 45 tablet, Rfl: 0  Allergies  Allergen Reactions   Gabapentin Other (See Comments)    Pt states sedates her   Penicillins Anaphylaxis, Swelling, Rash and Other (See Comments)    Older sister died from reaction to penicillin Has patient had a PCN reaction causing immediate rash, facial/tongue/throat swelling, SOB or lightheadedness with hypotension: Yes Has patient had a PCN reaction causing severe rash involving mucus membranes or skin necrosis: No Has patient had a PCN reaction  that required hospitalization: No Has patient had a PCN reaction occurring within the last 10 years: No If all of the above answers are "NO", then may proceed with Cephalosporin use.    Sulfa Antibiotics Rash   Levaquin [Levofloxacin] Nausea And Vomiting    Objective:   BP 100/68   Pulse 92   Temp 99.2 F (37.3 C) (Oral)   Ht 5\' 1"  (1.549 m)   Wt 121 lb (54.9 kg)   SpO2 95%   BMI 22.86 kg/m      05/05/2023    2:15 PM 02/10/2023    9:06 AM 10/18/2022    8:46 AM  Vitals with BMI  Height 5\' 1"  5\' 1"  5\' 1"   Weight 121 lbs 115 lbs 110 lbs 5 oz  BMI 22.87 21.74 20.85  Systolic 100 -- 131  Diastolic 68 -- 62  Pulse 92  99     Physical Exam Vitals and nursing note reviewed.  Constitutional:      Appearance: Normal appearance. She is normal weight.  HENT:     Head: Normocephalic and atraumatic.     Right Ear: Tympanic membrane, ear canal and external ear normal.     Left Ear: Tympanic membrane, ear canal and external ear normal.     Nose: Congestion present.     Right Sinus: Maxillary sinus tenderness and frontal sinus tenderness present.     Left Sinus: Frontal sinus tenderness present.     Mouth/Throat:     Mouth: Mucous membranes are moist.     Pharynx: Oropharynx is clear.  Eyes:     Conjunctiva/sclera: Conjunctivae normal.  Cardiovascular:     Rate and Rhythm: Normal rate and regular rhythm.     Pulses: Normal pulses.     Heart sounds: Normal heart sounds.  Pulmonary:     Effort: Pulmonary effort is normal.     Breath sounds: Normal breath sounds.  Musculoskeletal:     Cervical back: No tenderness.  Lymphadenopathy:     Cervical: No cervical adenopathy.  Skin:    General: Skin is warm and dry.  Neurological:     General: No focal deficit present.     Mental Status: She is alert and oriented to person, place, and time. Mental status is at baseline.  Psychiatric:        Mood and Affect: Mood normal.        Behavior: Behavior normal.        Thought Content:  Thought content normal.        Judgment: Judgment normal.     Assessment & Plan:  Acute non-recurrent maxillary sinusitis Assessment & Plan: Symptoms consistent with viral URI progressed to bacterial sinusitis with fever, right sided maxillary pain, and loss of taste and smell. She cannot take Augmentin, Doxy, or Levofloxacin. Will treat with Azithromycin. Instructed to return to office if symptoms persist or worsen.      Follow up plan: Return if symptoms worsen or fail to improve.  Park Meo, FNP

## 2023-05-05 NOTE — Assessment & Plan Note (Addendum)
Symptoms consistent with viral URI progressed to bacterial sinusitis with fever, right sided maxillary pain, and loss of taste and smell. She cannot take Augmentin, Doxy, or Levofloxacin. Will treat with Azithromycin. Instructed to return to office if symptoms persist or worsen. May continue OTC symptomatic management.

## 2023-05-11 ENCOUNTER — Encounter: Payer: Self-pay | Admitting: Hematology and Oncology

## 2023-05-11 ENCOUNTER — Other Ambulatory Visit (HOSPITAL_COMMUNITY): Payer: Self-pay

## 2023-05-16 ENCOUNTER — Other Ambulatory Visit: Payer: Self-pay

## 2023-05-16 ENCOUNTER — Encounter: Payer: Self-pay | Admitting: Hematology and Oncology

## 2023-05-20 ENCOUNTER — Other Ambulatory Visit (HOSPITAL_COMMUNITY): Payer: Self-pay

## 2023-05-23 DIAGNOSIS — C7951 Secondary malignant neoplasm of bone: Secondary | ICD-10-CM | POA: Diagnosis not present

## 2023-05-23 DIAGNOSIS — C50919 Malignant neoplasm of unspecified site of unspecified female breast: Secondary | ICD-10-CM | POA: Diagnosis not present

## 2023-05-23 DIAGNOSIS — C50911 Malignant neoplasm of unspecified site of right female breast: Secondary | ICD-10-CM | POA: Diagnosis not present

## 2023-05-23 DIAGNOSIS — C787 Secondary malignant neoplasm of liver and intrahepatic bile duct: Secondary | ICD-10-CM | POA: Diagnosis not present

## 2023-05-23 DIAGNOSIS — K760 Fatty (change of) liver, not elsewhere classified: Secondary | ICD-10-CM | POA: Diagnosis not present

## 2023-06-07 DIAGNOSIS — Z515 Encounter for palliative care: Secondary | ICD-10-CM | POA: Diagnosis not present

## 2023-06-07 DIAGNOSIS — C787 Secondary malignant neoplasm of liver and intrahepatic bile duct: Secondary | ICD-10-CM | POA: Diagnosis not present

## 2023-06-07 DIAGNOSIS — Z23 Encounter for immunization: Secondary | ICD-10-CM | POA: Diagnosis not present

## 2023-06-07 DIAGNOSIS — C50912 Malignant neoplasm of unspecified site of left female breast: Secondary | ICD-10-CM | POA: Diagnosis not present

## 2023-06-07 DIAGNOSIS — G629 Polyneuropathy, unspecified: Secondary | ICD-10-CM | POA: Diagnosis not present

## 2023-06-07 DIAGNOSIS — R110 Nausea: Secondary | ICD-10-CM | POA: Diagnosis not present

## 2023-06-07 DIAGNOSIS — R1909 Other intra-abdominal and pelvic swelling, mass and lump: Secondary | ICD-10-CM | POA: Diagnosis not present

## 2023-06-07 DIAGNOSIS — C50919 Malignant neoplasm of unspecified site of unspecified female breast: Secondary | ICD-10-CM | POA: Diagnosis not present

## 2023-06-07 DIAGNOSIS — Z8639 Personal history of other endocrine, nutritional and metabolic disease: Secondary | ICD-10-CM | POA: Diagnosis not present

## 2023-06-07 DIAGNOSIS — Z87891 Personal history of nicotine dependence: Secondary | ICD-10-CM | POA: Diagnosis not present

## 2023-06-07 DIAGNOSIS — Z79899 Other long term (current) drug therapy: Secondary | ICD-10-CM | POA: Diagnosis not present

## 2023-06-07 DIAGNOSIS — C7951 Secondary malignant neoplasm of bone: Secondary | ICD-10-CM | POA: Diagnosis not present

## 2023-06-07 DIAGNOSIS — C50911 Malignant neoplasm of unspecified site of right female breast: Secondary | ICD-10-CM | POA: Diagnosis not present

## 2023-06-07 DIAGNOSIS — L271 Localized skin eruption due to drugs and medicaments taken internally: Secondary | ICD-10-CM | POA: Diagnosis not present

## 2023-06-10 ENCOUNTER — Other Ambulatory Visit: Payer: Self-pay

## 2023-06-10 ENCOUNTER — Encounter: Payer: Self-pay | Admitting: Hematology and Oncology

## 2023-06-10 NOTE — Progress Notes (Signed)
Specialty Pharmacy Refill Coordination Note  Krystal Brooks is a 67 y.o. female contacted today regarding refills of specialty medication(s) Capecitabine   Patient requested [name] at [facility] at [place] date: 06/15/23   Medication will be filled on 06/14/23 Cash price $18.76 .

## 2023-06-14 ENCOUNTER — Encounter: Payer: Self-pay | Admitting: Hematology and Oncology

## 2023-06-22 ENCOUNTER — Other Ambulatory Visit (HOSPITAL_COMMUNITY): Payer: Self-pay

## 2023-07-12 ENCOUNTER — Encounter: Payer: Self-pay | Admitting: Hematology and Oncology

## 2023-07-12 ENCOUNTER — Other Ambulatory Visit (HOSPITAL_COMMUNITY): Payer: Self-pay | Admitting: Pharmacy Technician

## 2023-07-12 ENCOUNTER — Other Ambulatory Visit (HOSPITAL_COMMUNITY): Payer: Self-pay

## 2023-07-12 DIAGNOSIS — Z87891 Personal history of nicotine dependence: Secondary | ICD-10-CM | POA: Diagnosis not present

## 2023-07-12 DIAGNOSIS — C50911 Malignant neoplasm of unspecified site of right female breast: Secondary | ICD-10-CM | POA: Diagnosis not present

## 2023-07-12 DIAGNOSIS — C787 Secondary malignant neoplasm of liver and intrahepatic bile duct: Secondary | ICD-10-CM | POA: Diagnosis not present

## 2023-07-12 DIAGNOSIS — C7951 Secondary malignant neoplasm of bone: Secondary | ICD-10-CM | POA: Diagnosis not present

## 2023-07-12 DIAGNOSIS — R1909 Other intra-abdominal and pelvic swelling, mass and lump: Secondary | ICD-10-CM | POA: Diagnosis not present

## 2023-07-12 DIAGNOSIS — R110 Nausea: Secondary | ICD-10-CM | POA: Diagnosis not present

## 2023-07-12 DIAGNOSIS — Z170 Estrogen receptor positive status [ER+]: Secondary | ICD-10-CM | POA: Diagnosis not present

## 2023-07-12 DIAGNOSIS — C50312 Malignant neoplasm of lower-inner quadrant of left female breast: Secondary | ICD-10-CM | POA: Diagnosis not present

## 2023-07-12 DIAGNOSIS — L271 Localized skin eruption due to drugs and medicaments taken internally: Secondary | ICD-10-CM | POA: Diagnosis not present

## 2023-07-12 DIAGNOSIS — G629 Polyneuropathy, unspecified: Secondary | ICD-10-CM | POA: Diagnosis not present

## 2023-07-12 NOTE — Progress Notes (Signed)
Specialty Pharmacy Refill Coordination Note  TAMU MALSAM is a 67 y.o. female contacted today regarding refills of specialty medication(s) Capecitabine   Patient requested [name] at [facility] at [place] date: 07/25/23   Medication will be filled on 07/22/23.

## 2023-07-15 ENCOUNTER — Inpatient Hospital Stay: Payer: Medicare HMO

## 2023-07-15 ENCOUNTER — Inpatient Hospital Stay: Payer: Medicare HMO | Attending: Hematology and Oncology | Admitting: Hematology and Oncology

## 2023-07-22 ENCOUNTER — Other Ambulatory Visit: Payer: Self-pay

## 2023-07-22 ENCOUNTER — Encounter: Payer: Self-pay | Admitting: Hematology and Oncology

## 2023-07-27 ENCOUNTER — Other Ambulatory Visit (HOSPITAL_COMMUNITY): Payer: Self-pay

## 2023-08-08 DIAGNOSIS — Z1721 Progesterone receptor positive status: Secondary | ICD-10-CM | POA: Diagnosis not present

## 2023-08-08 DIAGNOSIS — G629 Polyneuropathy, unspecified: Secondary | ICD-10-CM | POA: Diagnosis not present

## 2023-08-08 DIAGNOSIS — R1909 Other intra-abdominal and pelvic swelling, mass and lump: Secondary | ICD-10-CM | POA: Diagnosis not present

## 2023-08-08 DIAGNOSIS — Z780 Asymptomatic menopausal state: Secondary | ICD-10-CM | POA: Diagnosis not present

## 2023-08-08 DIAGNOSIS — Z1732 Human epidermal growth factor receptor 2 negative status: Secondary | ICD-10-CM | POA: Diagnosis not present

## 2023-08-08 DIAGNOSIS — C50312 Malignant neoplasm of lower-inner quadrant of left female breast: Secondary | ICD-10-CM | POA: Diagnosis not present

## 2023-08-08 DIAGNOSIS — R110 Nausea: Secondary | ICD-10-CM | POA: Diagnosis not present

## 2023-08-08 DIAGNOSIS — C787 Secondary malignant neoplasm of liver and intrahepatic bile duct: Secondary | ICD-10-CM | POA: Diagnosis not present

## 2023-08-08 DIAGNOSIS — Z8639 Personal history of other endocrine, nutritional and metabolic disease: Secondary | ICD-10-CM | POA: Diagnosis not present

## 2023-08-08 DIAGNOSIS — L271 Localized skin eruption due to drugs and medicaments taken internally: Secondary | ICD-10-CM | POA: Diagnosis not present

## 2023-08-08 DIAGNOSIS — C7951 Secondary malignant neoplasm of bone: Secondary | ICD-10-CM | POA: Diagnosis not present

## 2023-08-08 DIAGNOSIS — Z87891 Personal history of nicotine dependence: Secondary | ICD-10-CM | POA: Diagnosis not present

## 2023-08-08 DIAGNOSIS — Z170 Estrogen receptor positive status [ER+]: Secondary | ICD-10-CM | POA: Diagnosis not present

## 2023-08-08 DIAGNOSIS — Z79899 Other long term (current) drug therapy: Secondary | ICD-10-CM | POA: Diagnosis not present

## 2023-08-08 DIAGNOSIS — Z79631 Long term (current) use of antimetabolite agent: Secondary | ICD-10-CM | POA: Diagnosis not present

## 2023-08-08 DIAGNOSIS — Z515 Encounter for palliative care: Secondary | ICD-10-CM | POA: Diagnosis not present

## 2023-08-11 DIAGNOSIS — L821 Other seborrheic keratosis: Secondary | ICD-10-CM | POA: Diagnosis not present

## 2023-08-11 DIAGNOSIS — L280 Lichen simplex chronicus: Secondary | ICD-10-CM | POA: Diagnosis not present

## 2023-08-12 ENCOUNTER — Other Ambulatory Visit: Payer: Self-pay

## 2023-08-12 ENCOUNTER — Encounter: Payer: Self-pay | Admitting: Hematology and Oncology

## 2023-08-12 ENCOUNTER — Other Ambulatory Visit: Payer: Self-pay | Admitting: Family Medicine

## 2023-08-12 ENCOUNTER — Other Ambulatory Visit (HOSPITAL_COMMUNITY): Payer: Self-pay

## 2023-08-12 NOTE — Progress Notes (Signed)
Specialty Pharmacy Refill Coordination Note  Krystal Brooks is a 67 y.o. female contacted today regarding refills of specialty medication(s) Capecitabine (XELODA)   Patient requested [name] at [facility] at [place] date: 08/22/23   Medication will be filled on 08/19/23.

## 2023-08-18 ENCOUNTER — Ambulatory Visit: Payer: Medicare HMO | Admitting: Family Medicine

## 2023-08-18 ENCOUNTER — Encounter: Payer: Self-pay | Admitting: Family Medicine

## 2023-08-18 VITALS — BP 144/90 | HR 86 | Temp 98.4°F | Ht 61.0 in | Wt 122.5 lb

## 2023-08-18 DIAGNOSIS — I10 Essential (primary) hypertension: Secondary | ICD-10-CM

## 2023-08-18 MED ORDER — LOSARTAN POTASSIUM 100 MG PO TABS: 100.0000 mg | ORAL_TABLET | Freq: Every day | ORAL | 3 refills | Status: DC

## 2023-08-18 MED ORDER — TRIAMTERENE-HCTZ 37.5-25 MG PO TABS: 1.0000 | ORAL_TABLET | Freq: Every day | ORAL | 3 refills | Status: DC

## 2023-08-18 NOTE — Progress Notes (Signed)
Subjective:    Patient ID: Krystal Brooks, female    DOB: 1956-07-14, 67 y.o.   MRN: [id number] Patient is currently battling metastatic breast cancer.  Recently at her oncologist her blood pressure has been 160-170/90.  Here today I rechecked her blood pressure and found to be 180/90.  She has started back losartan 100 mg a day 5 days ago.  She denies any chest pain shortness of breath or dyspnea on exertion. Past Medical History:  Diagnosis Date   Adie's pupil    LEFT EYE   Allergy    Asthma    Atherosclerosis of aorta (HCC)    bulge above renal arteries on mri 2016   Cancer (HCC) 2017   right breast ca   Cellulitis    chest   Complication of anesthesia    VERY HARD TO WAKE UP     Eczema    Hypertension    Personal history of radiation therapy    PONV (postoperative nausea and vomiting)    Smoker    Past Surgical History:  Procedure Laterality Date   ABDOMINAL HYSTERECTOMY     BREAST BIOPSY Right 01/02/2016   BREAST EXCISIONAL BIOPSY Right 1979   BREAST IMPLANT REMOVAL Bilateral 10/05/2017   Procedure: REMOVALOF BILATERAL BREAST IMPLANTS;  Surgeon: Peggye Form, DO;  Location: [facility];  Service: Plastics;  Laterality: Bilateral;   BREAST LUMPECTOMY Right 01/27/2016   BREAST LUMPECTOMY WITH RADIOACTIVE SEED AND SENTINEL LYMPH NODE BIOPSY Right 01/27/2016   Procedure: BREAST LUMPECTOMY WITH RADIOACTIVE SEED AND SENTINEL LYMPH NODE BIOPSY;  Surgeon: Glenna Fellows, MD;  Location: [facility];  Service: General;  Laterality: Right;   BREAST RECONSTRUCTION WITH PLACEMENT OF TISSUE EXPANDER AND FLEX HD (ACELLULAR HYDRATED DERMIS) Bilateral 03/16/2017   Procedure: BILATERAL IMMEDIATE BREAST RECONSTRUCTION WITH PLACEMENT OF TISSUE EXPANDER AND FLEX HD (ACELLULAR HYDRATED DERMIS);  Surgeon: Peggye Form, DO;  Location[facility];  Service: Plastics;  Laterality: Bilateral;   BREAST SURGERY  1979   cyst removed rt breast   CATARACT EXTRACTION  W/ INTRAOCULAR LENS IMPLANT     RT EYE   CESAREAN SECTION     x2   EXCISION OF BREAST LESION Left 01/30/2018   Procedure: EXCISION OF LEFT BREAST SCAR;  Surgeon: Peggye Form, DO;  Location: [facility];  Service: Plastics;  Laterality: Left;   LATISSIMUS FLAP TO BREAST Right 01/30/2018   Procedure: RIGHT LATISSIMUS MYOCUTANEOUS FLAP FOR RIGHT CHEST WALL RECONSTRUCTION;  Surgeon: Peggye Form, DO;  Location: [facility];  Service: Plastics;  Laterality: Right;   MASTECTOMY W/ SENTINEL NODE BIOPSY Bilateral 03/16/2017   Procedure: BILATERAL TOTAL MASTECTOMY WITH LEFT SENTINEL LYMPH NODE BIOPSY;  Surgeon: Glenna Fellows, MD;  Location: [facility];  Service: General;  Laterality: Bilateral;   REMOVAL OF BILATERAL TISSUE EXPANDERS WITH PLACEMENT OF BILATERAL BREAST IMPLANTS Bilateral 06/15/2017   Procedure: REMOVAL OF BILATERAL TISSUE EXPANDERS WITH PLACEMENT OF BILATERAL BREAST IMPLANTS;  Surgeon: Peggye Form, DO;  Location: [facility];  Service: Plastics;  Laterality: Bilateral;   TOTAL MASTECTOMY Bilateral 03/16/2017   SENTINAL NODE BIOPSY    Current Outpatient Medications on File Prior to Visit  Medication Sig Dispense Refill   albuterol (VENTOLIN HFA) 108 (90 Base) MCG/ACT inhaler INHALE 1-2 PUFFS EVERY 6 HOURS AS NEEDED FOR WHEEZING. 18 each 2   budesonide-formoterol (SYMBICORT) 80-4.5 MCG/ACT inhaler Inhale 2 puffs into the lungs 2 (two) times daily as needed ("for flares"). 1 each 3  capecitabine (XELODA) 500 MG tablet Take 1 tablet (500 mg total) by mouth 2 (two) times daily Take 1 tablet (500mg ) twice a day by mouth, 7 days on, 7 days off; then repeat. 28 tablet 11   denosumab (XGEVA) 120 MG/1.7ML SOLN injection Inject 120 mg into the skin every 30 (thirty) days. Heide Spark, MD at [facility] prescribes this.     escitalopram (LEXAPRO) 10 MG tablet TAKE 1 TABLET BY MOUTH EVERY DAY 30 tablet 0   montelukast (SINGULAIR) 10 MG tablet Take 1 tablet (10 mg total) by mouth  at bedtime. 90 tablet 1   Multiple Vitamin (MULTIVITAMIN WITH MINERALS) TABS tablet Take 1 tablet by mouth daily. 60 tablet 0   potassium chloride (KLOR-CON M10) 10 MEQ tablet Take 1 tablet (10 mEq total) by mouth daily. 30 tablet 1   promethazine (PHENERGAN) 12.5 MG tablet Take 1 tablet (12.5 mg total) by mouth every 6 (six) hours as needed for nausea or vomiting. 90 tablet 0   albuterol (PROVENTIL) (2.5 MG/3ML) 0.083% nebulizer solution TAKE 3 MLS (2.5 MG TOTAL) BY NEBULIZATION EVERY 6 HOURS AS NEEDED WHEEZING OR SHORTNESS OF BREATH. (Patient not taking: Reported on 08/18/2023) 300 mL 5   feeding supplement (ENSURE ENLIVE / ENSURE PLUS) LIQD Take 237 mLs by mouth 2 (two) times daily between meals. (Patient not taking: Reported on 08/18/2023) 10000 mL 0   traMADol (ULTRAM) 50 MG tablet Take 1 tablet (50 mg total) by mouth every 8 (eight) hours as needed. (Patient not taking: Reported on 08/18/2023) 45 tablet 0   No current facility-administered medications on file prior to visit.   Allergies  Allergen Reactions   Gabapentin Other (See Comments)    Pt states sedates her   Penicillins Anaphylaxis, Swelling, Rash and Other (See Comments)    Older sister died from reaction to penicillin Has patient had a PCN reaction causing immediate rash, facial/tongue/throat swelling, SOB or lightheadedness with hypotension: Yes Has patient had a PCN reaction causing severe rash involving mucus membranes or skin necrosis: No Has patient had a PCN reaction that required hospitalization: No Has patient had a PCN reaction occurring within the last 10 years: No If all of the above answers are "NO", then may proceed with Cephalosporin use.    Sulfa Antibiotics Rash   Levaquin [Levofloxacin] Nausea And Vomiting   Social History   Socioeconomic History   Marital status: Married    Spouse name: Not on file   Number of children: Not on file   Years of education: Not on file   Highest education level: Not on  file  Occupational History   Not on file  Tobacco Use   Smoking status: Former    Current packs/day: 0.50    Average packs/day: 0.5 packs/day for 40.0 years (20.0 ttl pk-yrs)    Types: Cigarettes   Smokeless tobacco: Never  Vaping Use   Vaping status: Never Used  Substance and Sexual Activity   Alcohol use: Yes    Alcohol/week: 0.0 standard drinks of alcohol    Comment: social   Drug use: No   Sexual activity: Yes    Birth control/protection: Surgical    Comment: hysterectomy  Other Topics Concern   Not on file  Social History Narrative   Three story home   Drinks caffeine   Right handed   Social Drivers of Health   Financial Resource Strain: Low Risk  (02/10/2023)   Overall Financial Resource Strain (CARDIA)    Difficulty of Paying Living Expenses: Not  hard at all  Food Insecurity: No Food Insecurity (02/10/2023)   Hunger Vital Sign    Worried About Running Out of Food in the Last Year: Never true    Ran Out of Food in the Last Year: Never true  Transportation Needs: No Transportation Needs (02/10/2023)   PRAPARE - Administrator, Civil Service (Medical): No    Lack of Transportation (Non-Medical): No  Physical Activity: Insufficiently Active (02/10/2023)   Exercise Vital Sign    Days of Exercise per Week: 3 days    Minutes of Exercise per Session: 30 min  Stress: No Stress Concern Present (02/10/2023)   [employer] of Occupational Health - Occupational Stress Questionnaire    Feeling of Stress : Not at all  Social Connections: Socially Integrated (02/10/2023)   Social Connection and Isolation Panel [NHANES]    Frequency of Communication with Friends and Family: More than three times a week    Frequency of Social Gatherings with Friends and Family: Three times a week    Attends Religious Services: More than 4 times per year    Active Member of Clubs or Organizations: Yes    Attends Banker Meetings: More than 4 times per year    Marital  Status: Married  Catering manager Violence: Not At Risk (02/10/2023)   Humiliation, Afraid, Rape, and Kick questionnaire    Fear of Current or Ex-Partner: No    Emotionally Abused: No    Physically Abused: No    Sexually Abused: No      Review of Systems  All other systems reviewed and are negative.      Objective:   Physical Exam Vitals reviewed.  Constitutional:      Appearance: Normal appearance.  Cardiovascular:     Rate and Rhythm: Normal rate and regular rhythm.     Heart sounds: Normal heart sounds.  Pulmonary:     Effort: Pulmonary effort is normal.     Breath sounds: No stridor, decreased air movement or transmitted upper airway sounds. No wheezing, rhonchi or rales.  Neurological:     Mental Status: She is alert.           Assessment & Plan:  Benign essential HTN Continue losartan 100 mg a day but given the elevated blood pressure add triamterene/hydrochlorothiazide 37.5/25 1 p.o. daily and recheck blood pressure in 1 week.

## 2023-08-19 ENCOUNTER — Encounter: Payer: Self-pay | Admitting: Hematology and Oncology

## 2023-08-19 ENCOUNTER — Other Ambulatory Visit: Payer: Self-pay

## 2023-08-26 ENCOUNTER — Ambulatory Visit: Payer: Medicare HMO

## 2023-08-26 NOTE — Progress Notes (Signed)
Patient is in office today for a nurse visit for Blood Pressure Check. Patient had a blood pressure of 126/78. Patient is in no distress. I advised patient to keep a check on her blood pressure at home and to report any abnormal findings to the office.

## 2023-08-30 ENCOUNTER — Other Ambulatory Visit (HOSPITAL_COMMUNITY): Payer: Self-pay

## 2023-09-07 ENCOUNTER — Other Ambulatory Visit: Payer: Self-pay

## 2023-09-07 DIAGNOSIS — Z79899 Other long term (current) drug therapy: Secondary | ICD-10-CM | POA: Diagnosis not present

## 2023-09-07 DIAGNOSIS — Z1732 Human epidermal growth factor receptor 2 negative status: Secondary | ICD-10-CM | POA: Diagnosis not present

## 2023-09-07 DIAGNOSIS — C7951 Secondary malignant neoplasm of bone: Secondary | ICD-10-CM | POA: Diagnosis not present

## 2023-09-07 DIAGNOSIS — Z87891 Personal history of nicotine dependence: Secondary | ICD-10-CM | POA: Diagnosis not present

## 2023-09-07 DIAGNOSIS — C787 Secondary malignant neoplasm of liver and intrahepatic bile duct: Secondary | ICD-10-CM | POA: Diagnosis not present

## 2023-09-07 DIAGNOSIS — G629 Polyneuropathy, unspecified: Secondary | ICD-10-CM | POA: Diagnosis not present

## 2023-09-07 DIAGNOSIS — L271 Localized skin eruption due to drugs and medicaments taken internally: Secondary | ICD-10-CM | POA: Diagnosis not present

## 2023-09-07 DIAGNOSIS — Z1721 Progesterone receptor positive status: Secondary | ICD-10-CM | POA: Diagnosis not present

## 2023-09-07 DIAGNOSIS — C50919 Malignant neoplasm of unspecified site of unspecified female breast: Secondary | ICD-10-CM | POA: Diagnosis not present

## 2023-09-07 DIAGNOSIS — C50312 Malignant neoplasm of lower-inner quadrant of left female breast: Secondary | ICD-10-CM | POA: Diagnosis not present

## 2023-09-07 DIAGNOSIS — R110 Nausea: Secondary | ICD-10-CM | POA: Diagnosis not present

## 2023-09-15 ENCOUNTER — Encounter: Payer: Self-pay | Admitting: Hematology and Oncology

## 2023-09-15 ENCOUNTER — Other Ambulatory Visit: Payer: Self-pay

## 2023-09-15 NOTE — Progress Notes (Signed)
Specialty Pharmacy Ongoing Clinical Assessment Note  Krystal Brooks is a 68 y.o. female who is being followed by the specialty pharmacy service for RxSp Oncology   Patient's specialty medication(s) reviewed today: Capecitabine (XELODA)   Missed doses in the last 4 weeks: 0   Patient/Caregiver did not have any additional questions or concerns.   Therapeutic benefit summary: Unable to assess   Adverse events/side effects summary: No adverse events/side effects   Patient's therapy is appropriate to: Continue    Goals Addressed             This Visit's Progress    Stabilization of disease       Patient is on track. Patient will maintain adherence         Follow up:  6 months  Bobette Mo Specialty Pharmacist

## 2023-09-15 NOTE — Progress Notes (Signed)
Specialty Pharmacy Refill Coordination Note  Krystal Brooks is a 68 y.o. female contacted today regarding refills of specialty medication(s) Capecitabine (XELODA)   Patient requested [name] at [facility] at [place] date: 09/21/23   Medication will be filled on 09/20/23.

## 2023-09-20 ENCOUNTER — Other Ambulatory Visit: Payer: Self-pay

## 2023-09-20 ENCOUNTER — Encounter: Payer: Self-pay | Admitting: Hematology and Oncology

## 2023-09-30 ENCOUNTER — Other Ambulatory Visit (HOSPITAL_COMMUNITY): Payer: Self-pay

## 2023-10-03 ENCOUNTER — Other Ambulatory Visit: Payer: Self-pay

## 2023-10-03 ENCOUNTER — Telehealth: Payer: Self-pay | Admitting: Family Medicine

## 2023-10-03 DIAGNOSIS — J0100 Acute maxillary sinusitis, unspecified: Secondary | ICD-10-CM

## 2023-10-03 DIAGNOSIS — J441 Chronic obstructive pulmonary disease with (acute) exacerbation: Secondary | ICD-10-CM

## 2023-10-03 DIAGNOSIS — J45909 Unspecified asthma, uncomplicated: Secondary | ICD-10-CM

## 2023-10-03 MED ORDER — MONTELUKAST SODIUM 10 MG PO TABS: 10.0000 mg | ORAL_TABLET | Freq: Every day | ORAL | 1 refills | Status: DC

## 2023-10-03 NOTE — Telephone Encounter (Signed)
Prescription Request  10/03/2023  LOV: 08/18/2023  What is the name of the medication or equipment?   montelukast (SINGULAIR) 10 MG tablet  **original script was for 90 days**  Have you contacted your pharmacy to request a refill? Yes   Which pharmacy would you like this sent to?  [id number] [place] [place] - [address] [place] [address] Phone: [phone] Fax: [phone]    Patient notified that their request is being sent to the clinical staff for review and that they should receive a response within 2 business days.   Please advise pharmacist.

## 2023-10-18 ENCOUNTER — Encounter: Payer: Self-pay | Admitting: Hematology and Oncology

## 2023-10-18 ENCOUNTER — Other Ambulatory Visit: Payer: Self-pay | Admitting: Pharmacy Technician

## 2023-10-18 ENCOUNTER — Other Ambulatory Visit: Payer: Self-pay

## 2023-10-18 NOTE — Progress Notes (Signed)
 Specialty Pharmacy Refill Coordination Note  Krystal Brooks is a 68 y.o. female contacted today regarding refills of specialty medication(s) Capecitabine (XELODA)   Patient requested [name] at [facility] at [place] date: 10/24/23   Medication will be filled on 10/21/23.

## 2023-10-21 ENCOUNTER — Other Ambulatory Visit: Payer: Self-pay

## 2023-10-21 ENCOUNTER — Encounter: Payer: Self-pay | Admitting: Hematology and Oncology

## 2023-10-26 DIAGNOSIS — G629 Polyneuropathy, unspecified: Secondary | ICD-10-CM | POA: Diagnosis not present

## 2023-10-26 DIAGNOSIS — Z79899 Other long term (current) drug therapy: Secondary | ICD-10-CM | POA: Diagnosis not present

## 2023-10-26 DIAGNOSIS — Z87891 Personal history of nicotine dependence: Secondary | ICD-10-CM | POA: Diagnosis not present

## 2023-10-26 DIAGNOSIS — C7951 Secondary malignant neoplasm of bone: Secondary | ICD-10-CM | POA: Diagnosis not present

## 2023-10-26 DIAGNOSIS — Z1721 Progesterone receptor positive status: Secondary | ICD-10-CM | POA: Diagnosis not present

## 2023-10-26 DIAGNOSIS — C50312 Malignant neoplasm of lower-inner quadrant of left female breast: Secondary | ICD-10-CM | POA: Diagnosis not present

## 2023-10-26 DIAGNOSIS — Z170 Estrogen receptor positive status [ER+]: Secondary | ICD-10-CM | POA: Diagnosis not present

## 2023-10-26 DIAGNOSIS — E876 Hypokalemia: Secondary | ICD-10-CM | POA: Diagnosis not present

## 2023-10-26 DIAGNOSIS — Z1732 Human epidermal growth factor receptor 2 negative status: Secondary | ICD-10-CM | POA: Diagnosis not present

## 2023-10-26 DIAGNOSIS — C787 Secondary malignant neoplasm of liver and intrahepatic bile duct: Secondary | ICD-10-CM | POA: Diagnosis not present

## 2023-10-26 DIAGNOSIS — L271 Localized skin eruption due to drugs and medicaments taken internally: Secondary | ICD-10-CM | POA: Diagnosis not present

## 2023-10-27 ENCOUNTER — Other Ambulatory Visit (HOSPITAL_COMMUNITY): Payer: Self-pay

## 2023-11-14 ENCOUNTER — Other Ambulatory Visit: Payer: Self-pay

## 2023-11-14 ENCOUNTER — Encounter: Payer: Self-pay | Admitting: Hematology and Oncology

## 2023-11-14 NOTE — Progress Notes (Signed)
 Specialty Pharmacy Refill Coordination Note  Krystal Brooks is a 68 y.o. female contacted today regarding refills of specialty medication(s) Capecitabine (XELODA)   Patient requested (Patient-Rptd) Pickup at [facility] at [facility] date: (Patient-Rptd) 11/21/23   Medication will be filled on 03.21.25.

## 2023-11-18 ENCOUNTER — Other Ambulatory Visit: Payer: Self-pay

## 2023-11-18 ENCOUNTER — Encounter: Payer: Self-pay | Admitting: Hematology and Oncology

## 2023-12-08 ENCOUNTER — Other Ambulatory Visit: Payer: Self-pay

## 2023-12-08 ENCOUNTER — Encounter: Payer: Self-pay | Admitting: Hematology and Oncology

## 2023-12-08 NOTE — Progress Notes (Signed)
 Specialty Pharmacy Refill Coordination Note  Krystal Brooks is a 68 y.o. female contacted today regarding refills of specialty medication(s) Capecitabine (XELODA)   Patient requested (Patient-Rptd) Pickup at [facility] at [facility] date: (Patient-Rptd) 12/23/23   Medication will be filled on 04.24.25.

## 2023-12-12 ENCOUNTER — Other Ambulatory Visit: Payer: Self-pay | Admitting: Family Medicine

## 2023-12-13 NOTE — Telephone Encounter (Signed)
 Requested Prescriptions  Pending Prescriptions Disp Refills   albuterol (VENTOLIN HFA) 108 (90 Base) MCG/ACT inhaler [Pharmacy Med Name: ALBUTEROL HFA (VENTOLIN) INH] 18 each 0    Sig: INHALE 1-2 PUFFS EVERY 6 HOURS AS NEEDED FOR WHEEZING.     Pulmonology:  Beta Agonists 2 Passed - 12/13/2023 10:32 AM      Passed - Last BP in normal range    BP Readings from Last 1 Encounters:  08/26/23 126/78         Passed - Last Heart Rate in normal range    Pulse Readings from Last 1 Encounters:  08/18/23 86         Passed - Valid encounter within last 12 months    Recent Outpatient Visits           3 months ago Benign essential HTN   [place] [facility] Austine Lefort, MD   7 months ago Acute non-recurrent maxillary sinusitis   [place] [facility] Jenelle Mis, FNP   1 year ago Left sided sciatica   [place] [facility] Austine Lefort, MD   1 year ago COPD exacerbation [facility])   [place] [facility] Austine Lefort, MD   1 year ago Hypomagnesemia   [place] [facility] Pickard, Cisco Crest, MD

## 2023-12-20 DIAGNOSIS — Z170 Estrogen receptor positive status [ER+]: Secondary | ICD-10-CM | POA: Diagnosis not present

## 2023-12-20 DIAGNOSIS — Z87891 Personal history of nicotine dependence: Secondary | ICD-10-CM | POA: Diagnosis not present

## 2023-12-20 DIAGNOSIS — C787 Secondary malignant neoplasm of liver and intrahepatic bile duct: Secondary | ICD-10-CM | POA: Diagnosis not present

## 2023-12-20 DIAGNOSIS — Z862 Personal history of diseases of the blood and blood-forming organs and certain disorders involving the immune mechanism: Secondary | ICD-10-CM | POA: Diagnosis not present

## 2023-12-20 DIAGNOSIS — G629 Polyneuropathy, unspecified: Secondary | ICD-10-CM | POA: Diagnosis not present

## 2023-12-20 DIAGNOSIS — Z719 Counseling, unspecified: Secondary | ICD-10-CM | POA: Diagnosis not present

## 2023-12-20 DIAGNOSIS — C7951 Secondary malignant neoplasm of bone: Secondary | ICD-10-CM | POA: Diagnosis not present

## 2023-12-20 DIAGNOSIS — Z1721 Progesterone receptor positive status: Secondary | ICD-10-CM | POA: Diagnosis not present

## 2023-12-20 DIAGNOSIS — Z1732 Human epidermal growth factor receptor 2 negative status: Secondary | ICD-10-CM | POA: Diagnosis not present

## 2023-12-20 DIAGNOSIS — L271 Localized skin eruption due to drugs and medicaments taken internally: Secondary | ICD-10-CM | POA: Diagnosis not present

## 2023-12-20 DIAGNOSIS — C50919 Malignant neoplasm of unspecified site of unspecified female breast: Secondary | ICD-10-CM | POA: Diagnosis not present

## 2023-12-20 DIAGNOSIS — Z79631 Long term (current) use of antimetabolite agent: Secondary | ICD-10-CM | POA: Diagnosis not present

## 2023-12-22 ENCOUNTER — Encounter: Payer: Self-pay | Admitting: Hematology and Oncology

## 2023-12-22 ENCOUNTER — Other Ambulatory Visit: Payer: Self-pay | Admitting: Family Medicine

## 2023-12-22 ENCOUNTER — Other Ambulatory Visit: Payer: Self-pay

## 2023-12-22 DIAGNOSIS — F17200 Nicotine dependence, unspecified, uncomplicated: Secondary | ICD-10-CM

## 2023-12-22 DIAGNOSIS — J441 Chronic obstructive pulmonary disease with (acute) exacerbation: Secondary | ICD-10-CM

## 2023-12-23 NOTE — Telephone Encounter (Signed)
 LOV 08/18/2023 with Dr. Cheril Cork  Requested Prescriptions  Pending Prescriptions Disp Refills   budesonide -formoterol  (SYMBICORT ) 80-4.5 MCG/ACT inhaler [Pharmacy Med Name: BUDESONIDE -FORMOTEROL  80-4.5] 10.2 each 3    Sig: Inhale 2 puffs into the lungs 2 (two) times daily as needed ("for flares").     Pulmonology:  Combination Products Passed - 12/23/2023 10:39 AM      Passed - Valid encounter within last 12 months    Recent Outpatient Visits           4 months ago Benign essential HTN   [place] [facility] Cheril Cork, Cisco Crest, MD   7 months ago Acute non-recurrent maxillary sinusitis   [place] [facility] Jenelle Mis, FNP   1 year ago Left sided sciatica   [place] [facility] Austine Lefort, MD   1 year ago COPD exacerbation [facility])   [place] [facility] Austine Lefort, MD   1 year ago Hypomagnesemia   [place] [facility] Pickard, Cisco Crest, MD

## 2023-12-26 ENCOUNTER — Other Ambulatory Visit: Payer: Self-pay | Admitting: Family Medicine

## 2024-01-09 ENCOUNTER — Other Ambulatory Visit: Payer: Self-pay

## 2024-01-10 ENCOUNTER — Encounter: Payer: Self-pay | Admitting: Hematology and Oncology

## 2024-01-10 ENCOUNTER — Other Ambulatory Visit: Payer: Self-pay | Admitting: Pharmacy Technician

## 2024-01-10 ENCOUNTER — Other Ambulatory Visit: Payer: Self-pay

## 2024-01-10 NOTE — Progress Notes (Signed)
 Specialty Pharmacy Refill Coordination Note  Krystal Brooks is a 68 y.o. female contacted today regarding refills of specialty medication(s) Capecitabine  (XELODA )   Patient requested (Patient-Rptd) Pickup at [facility] at [facility] date: (Patient-Rptd) [birth year]   Medication will be filled on 01/20/24.

## 2024-01-19 ENCOUNTER — Other Ambulatory Visit: Payer: Self-pay | Admitting: Family Medicine

## 2024-01-19 DIAGNOSIS — J0100 Acute maxillary sinusitis, unspecified: Secondary | ICD-10-CM

## 2024-01-19 DIAGNOSIS — J441 Chronic obstructive pulmonary disease with (acute) exacerbation: Secondary | ICD-10-CM

## 2024-01-19 DIAGNOSIS — J45909 Unspecified asthma, uncomplicated: Secondary | ICD-10-CM

## 2024-01-20 ENCOUNTER — Other Ambulatory Visit: Payer: Self-pay

## 2024-01-20 ENCOUNTER — Encounter: Payer: Self-pay | Admitting: Hematology and Oncology

## 2024-01-20 NOTE — Telephone Encounter (Signed)
 Prescription Request  01/20/2024  LOV: 08/18/23  What is the name of the medication or equipment? albuterol  (VENTOLIN  HFA) 108 (90 Base) MCG/ACT inhaler   Have you contacted your pharmacy to request a refill? Yes   Which pharmacy would you like this sent to?  [id number] [place], [place] - [address] [place] [address] Phone: [phone] Fax: [phone]    Patient notified that their request is being sent to the clinical staff for review and that they should receive a response within 2 business days.   Please advise at [facility] [phone]

## 2024-01-24 MED ORDER — ALBUTEROL SULFATE HFA 108 (90 BASE) MCG/ACT IN AERS: INHALATION_SPRAY | RESPIRATORY_TRACT | 2 refills | Status: DC

## 2024-01-24 NOTE — Telephone Encounter (Signed)
 Requested Prescriptions  Pending Prescriptions Disp Refills   albuterol  (VENTOLIN  HFA) 108 (90 Base) MCG/ACT inhaler 18 each 2    Sig: INHALE 1-2 PUFFS EVERY 6 HOURS AS NEEDED FOR WHEEZING.     Pulmonology:  Beta Agonists 2 Passed - 01/24/2024 12:27 PM      Passed - Last BP in normal range    BP Readings from Last 1 Encounters:  08/26/23 126/78         Passed - Last Heart Rate in normal range    Pulse Readings from Last 1 Encounters:  08/18/23 86         Passed - Valid encounter within last 12 months    Recent Outpatient Visits           5 months ago Benign essential HTN   [place] [facility] Austine Lefort, MD   8 months ago Acute non-recurrent maxillary sinusitis   [place] [facility] Jenelle Mis, FNP   1 year ago Left sided sciatica   [place] [facility] Austine Lefort, MD   1 year ago COPD exacerbation [facility])   [place] [facility] Austine Lefort, MD   1 year ago Hypomagnesemia   [place] [facility] Pickard, Cisco Crest, MD

## 2024-01-30 ENCOUNTER — Other Ambulatory Visit (HOSPITAL_COMMUNITY): Payer: Self-pay

## 2024-02-01 ENCOUNTER — Other Ambulatory Visit: Payer: Self-pay

## 2024-02-01 DIAGNOSIS — Z79899 Other long term (current) drug therapy: Secondary | ICD-10-CM | POA: Diagnosis not present

## 2024-02-01 DIAGNOSIS — M25559 Pain in unspecified hip: Secondary | ICD-10-CM | POA: Diagnosis not present

## 2024-02-01 DIAGNOSIS — R635 Abnormal weight gain: Secondary | ICD-10-CM | POA: Diagnosis not present

## 2024-02-01 DIAGNOSIS — Z1721 Progesterone receptor positive status: Secondary | ICD-10-CM | POA: Diagnosis not present

## 2024-02-01 DIAGNOSIS — Z6826 Body mass index (BMI) 26.0-26.9, adult: Secondary | ICD-10-CM | POA: Diagnosis not present

## 2024-02-01 DIAGNOSIS — M533 Sacrococcygeal disorders, not elsewhere classified: Secondary | ICD-10-CM | POA: Diagnosis not present

## 2024-02-01 DIAGNOSIS — Z780 Asymptomatic menopausal state: Secondary | ICD-10-CM | POA: Diagnosis not present

## 2024-02-01 DIAGNOSIS — C787 Secondary malignant neoplasm of liver and intrahepatic bile duct: Secondary | ICD-10-CM | POA: Diagnosis not present

## 2024-02-01 DIAGNOSIS — G629 Polyneuropathy, unspecified: Secondary | ICD-10-CM | POA: Diagnosis not present

## 2024-02-01 DIAGNOSIS — Z1732 Human epidermal growth factor receptor 2 negative status: Secondary | ICD-10-CM | POA: Diagnosis not present

## 2024-02-01 DIAGNOSIS — Z170 Estrogen receptor positive status [ER+]: Secondary | ICD-10-CM | POA: Diagnosis not present

## 2024-02-01 DIAGNOSIS — Z87891 Personal history of nicotine dependence: Secondary | ICD-10-CM | POA: Diagnosis not present

## 2024-02-01 DIAGNOSIS — R110 Nausea: Secondary | ICD-10-CM | POA: Diagnosis not present

## 2024-02-01 DIAGNOSIS — L271 Localized skin eruption due to drugs and medicaments taken internally: Secondary | ICD-10-CM | POA: Diagnosis not present

## 2024-02-01 DIAGNOSIS — E876 Hypokalemia: Secondary | ICD-10-CM | POA: Diagnosis not present

## 2024-02-01 DIAGNOSIS — C50911 Malignant neoplasm of unspecified site of right female breast: Secondary | ICD-10-CM | POA: Diagnosis not present

## 2024-02-01 DIAGNOSIS — C7951 Secondary malignant neoplasm of bone: Secondary | ICD-10-CM | POA: Diagnosis not present

## 2024-02-01 MED ORDER — CAPECITABINE 500 MG PO TABS
ORAL_TABLET | ORAL | 11 refills | Status: DC
  Filled 2024-03-07: qty 28, 28d supply, fill #0
  Filled 2024-04-06 (×2): qty 28, 28d supply, fill #1
  Filled 2024-05-09: qty 28, 28d supply, fill #2
  Filled 2024-06-12: qty 28, 28d supply, fill #3

## 2024-02-08 DIAGNOSIS — S92353A Displaced fracture of fifth metatarsal bone, unspecified foot, initial encounter for closed fracture: Secondary | ICD-10-CM | POA: Insufficient documentation

## 2024-02-08 DIAGNOSIS — M79671 Pain in right foot: Secondary | ICD-10-CM | POA: Insufficient documentation

## 2024-02-08 DIAGNOSIS — S92351A Displaced fracture of fifth metatarsal bone, right foot, initial encounter for closed fracture: Secondary | ICD-10-CM | POA: Diagnosis not present

## 2024-02-10 ENCOUNTER — Other Ambulatory Visit: Payer: Self-pay

## 2024-02-13 ENCOUNTER — Encounter (INDEPENDENT_AMBULATORY_CARE_PROVIDER_SITE_OTHER): Payer: Self-pay

## 2024-02-13 ENCOUNTER — Other Ambulatory Visit: Payer: Self-pay

## 2024-02-13 NOTE — Progress Notes (Signed)
 Specialty Pharmacy Refill Coordination Note  Krystal Brooks is a 68 y.o. female contacted today regarding refills of specialty medication(s) Capecitabine  (XELODA )   Patient requested [place] Dk at [facility] at [place] date: 02/17/24   Medication will be filled on 02/16/24.

## 2024-02-16 ENCOUNTER — Other Ambulatory Visit: Payer: Self-pay

## 2024-03-01 DIAGNOSIS — S92351A Displaced fracture of fifth metatarsal bone, right foot, initial encounter for closed fracture: Secondary | ICD-10-CM | POA: Diagnosis not present

## 2024-03-06 ENCOUNTER — Other Ambulatory Visit: Payer: Self-pay

## 2024-03-07 ENCOUNTER — Other Ambulatory Visit: Payer: Self-pay

## 2024-03-07 ENCOUNTER — Encounter: Payer: Self-pay | Admitting: Hematology and Oncology

## 2024-03-07 NOTE — Progress Notes (Signed)
 Specialty Pharmacy Refill Coordination Note  Krystal Brooks is a 68 y.o. female contacted today regarding refills of specialty medication(s) Capecitabine  (XELODA )   Patient requested [name] at [facility] at [place] date: 03/15/24   Medication will be filled on 03/14/24.

## 2024-03-07 NOTE — Progress Notes (Signed)
 Specialty Pharmacy Ongoing Clinical Assessment Note  Krystal Brooks is a 68 y.o. female who is being followed by the specialty pharmacy service for RxSp Oncology   Patient's specialty medication(s) reviewed today: Capecitabine  (XELODA )   Missed doses in the last 4 weeks: 0   Patient/Caregiver did not have any additional questions or concerns.   Therapeutic benefit summary: Patient is achieving benefit   Adverse events/side effects summary: No adverse events/side effects   Patient's therapy is appropriate to: Continue    Goals Addressed             This Visit's Progress    Stabilization of disease   On track    Patient is on track. Patient will maintain adherence         Follow up: 6 months  [name] M Si Jachim Specialty Pharmacist

## 2024-03-14 ENCOUNTER — Encounter: Payer: Self-pay | Admitting: Hematology and Oncology

## 2024-03-14 ENCOUNTER — Other Ambulatory Visit: Payer: Self-pay

## 2024-03-20 DIAGNOSIS — R635 Abnormal weight gain: Secondary | ICD-10-CM | POA: Diagnosis not present

## 2024-03-20 DIAGNOSIS — C50919 Malignant neoplasm of unspecified site of unspecified female breast: Secondary | ICD-10-CM | POA: Diagnosis not present

## 2024-03-20 DIAGNOSIS — E876 Hypokalemia: Secondary | ICD-10-CM | POA: Diagnosis not present

## 2024-03-20 DIAGNOSIS — C7951 Secondary malignant neoplasm of bone: Secondary | ICD-10-CM | POA: Diagnosis not present

## 2024-03-20 DIAGNOSIS — Z1721 Progesterone receptor positive status: Secondary | ICD-10-CM | POA: Diagnosis not present

## 2024-03-20 DIAGNOSIS — Z1732 Human epidermal growth factor receptor 2 negative status: Secondary | ICD-10-CM | POA: Diagnosis not present

## 2024-03-20 DIAGNOSIS — G629 Polyneuropathy, unspecified: Secondary | ICD-10-CM | POA: Diagnosis not present

## 2024-03-20 DIAGNOSIS — Z170 Estrogen receptor positive status [ER+]: Secondary | ICD-10-CM | POA: Diagnosis not present

## 2024-03-20 DIAGNOSIS — C50312 Malignant neoplasm of lower-inner quadrant of left female breast: Secondary | ICD-10-CM | POA: Diagnosis not present

## 2024-03-20 DIAGNOSIS — L271 Localized skin eruption due to drugs and medicaments taken internally: Secondary | ICD-10-CM | POA: Diagnosis not present

## 2024-03-20 DIAGNOSIS — Z87891 Personal history of nicotine dependence: Secondary | ICD-10-CM | POA: Diagnosis not present

## 2024-03-20 DIAGNOSIS — R918 Other nonspecific abnormal finding of lung field: Secondary | ICD-10-CM | POA: Diagnosis not present

## 2024-03-20 DIAGNOSIS — C787 Secondary malignant neoplasm of liver and intrahepatic bile duct: Secondary | ICD-10-CM | POA: Diagnosis not present

## 2024-04-06 ENCOUNTER — Other Ambulatory Visit: Payer: Self-pay

## 2024-04-06 ENCOUNTER — Encounter: Payer: Self-pay | Admitting: Hematology and Oncology

## 2024-04-06 NOTE — Progress Notes (Signed)
 Specialty Pharmacy Refill Coordination Note  Krystal Brooks is a 68 y.o. female contacted today regarding refills of specialty medication(s) Capecitabine  (XELODA )   Patient requested [name] at [facility] at [place] date: 04/16/24   Medication will be filled on 04/13/24.

## 2024-04-13 ENCOUNTER — Other Ambulatory Visit: Payer: Self-pay

## 2024-04-13 ENCOUNTER — Encounter: Payer: Self-pay | Admitting: Hematology and Oncology

## 2024-04-23 ENCOUNTER — Other Ambulatory Visit: Payer: Self-pay | Admitting: Family Medicine

## 2024-04-23 DIAGNOSIS — J441 Chronic obstructive pulmonary disease with (acute) exacerbation: Secondary | ICD-10-CM

## 2024-04-23 DIAGNOSIS — F17200 Nicotine dependence, unspecified, uncomplicated: Secondary | ICD-10-CM

## 2024-04-24 NOTE — Telephone Encounter (Signed)
 Requested Prescriptions  Pending Prescriptions Disp Refills   budesonide -formoterol  (SYMBICORT ) 80-4.5 MCG/ACT inhaler [Pharmacy Med Name: BUDESONIDE -FORMOTEROL  80-4.5] 10.2 each 0    Sig: Inhale 2 puffs into the lungs 2 (two) times daily as needed (for flares).     Pulmonology:  Combination Products Passed - 04/24/2024  1:50 PM      Passed - Valid encounter within last 12 months    Recent Outpatient Visits           8 months ago Benign essential HTN   [place] [facility] Duanne, Butler DASEN, MD   11 months ago Acute non-recurrent maxillary sinusitis   [place] [facility] Kayla Jeoffrey RAMAN, FNP   1 year ago Left sided sciatica   [place] [facility] Duanne Butler DASEN, MD   1 year ago COPD exacerbation [facility])   [place] [facility] Duanne Butler DASEN, MD   1 year ago Hypomagnesemia   [place] [facility] Pickard, Butler DASEN, MD

## 2024-05-02 DIAGNOSIS — C50911 Malignant neoplasm of unspecified site of right female breast: Secondary | ICD-10-CM | POA: Diagnosis not present

## 2024-05-02 DIAGNOSIS — C50919 Malignant neoplasm of unspecified site of unspecified female breast: Secondary | ICD-10-CM | POA: Diagnosis not present

## 2024-05-02 DIAGNOSIS — C787 Secondary malignant neoplasm of liver and intrahepatic bile duct: Secondary | ICD-10-CM | POA: Diagnosis not present

## 2024-05-02 DIAGNOSIS — Z170 Estrogen receptor positive status [ER+]: Secondary | ICD-10-CM | POA: Diagnosis not present

## 2024-05-02 DIAGNOSIS — M533 Sacrococcygeal disorders, not elsewhere classified: Secondary | ICD-10-CM | POA: Diagnosis not present

## 2024-05-02 DIAGNOSIS — G629 Polyneuropathy, unspecified: Secondary | ICD-10-CM | POA: Diagnosis not present

## 2024-05-02 DIAGNOSIS — R635 Abnormal weight gain: Secondary | ICD-10-CM | POA: Diagnosis not present

## 2024-05-02 DIAGNOSIS — R110 Nausea: Secondary | ICD-10-CM | POA: Diagnosis not present

## 2024-05-02 DIAGNOSIS — Z1732 Human epidermal growth factor receptor 2 negative status: Secondary | ICD-10-CM | POA: Diagnosis not present

## 2024-05-02 DIAGNOSIS — Z8639 Personal history of other endocrine, nutritional and metabolic disease: Secondary | ICD-10-CM | POA: Diagnosis not present

## 2024-05-02 DIAGNOSIS — Z9221 Personal history of antineoplastic chemotherapy: Secondary | ICD-10-CM | POA: Diagnosis not present

## 2024-05-02 DIAGNOSIS — Z79899 Other long term (current) drug therapy: Secondary | ICD-10-CM | POA: Diagnosis not present

## 2024-05-02 DIAGNOSIS — C7951 Secondary malignant neoplasm of bone: Secondary | ICD-10-CM | POA: Diagnosis not present

## 2024-05-02 DIAGNOSIS — Z515 Encounter for palliative care: Secondary | ICD-10-CM | POA: Diagnosis not present

## 2024-05-02 DIAGNOSIS — F17210 Nicotine dependence, cigarettes, uncomplicated: Secondary | ICD-10-CM | POA: Diagnosis not present

## 2024-05-02 DIAGNOSIS — Z780 Asymptomatic menopausal state: Secondary | ICD-10-CM | POA: Diagnosis not present

## 2024-05-09 ENCOUNTER — Other Ambulatory Visit: Payer: Self-pay

## 2024-05-09 NOTE — Progress Notes (Signed)
 Specialty Pharmacy Refill Coordination Note  Krystal Brooks is a 68 y.o. female contacted today regarding refills of specialty medication(s) Capecitabine  (XELODA )   Patient requested [name] at [facility] at [place] date: 05/14/24   Medication will be filled on 05/11/24.

## 2024-05-10 ENCOUNTER — Other Ambulatory Visit: Payer: Self-pay

## 2024-05-15 ENCOUNTER — Other Ambulatory Visit: Payer: Self-pay

## 2024-05-15 ENCOUNTER — Other Ambulatory Visit (HOSPITAL_COMMUNITY): Payer: Self-pay

## 2024-05-23 ENCOUNTER — Other Ambulatory Visit: Payer: Self-pay | Admitting: Family Medicine

## 2024-05-23 DIAGNOSIS — J441 Chronic obstructive pulmonary disease with (acute) exacerbation: Secondary | ICD-10-CM

## 2024-05-23 DIAGNOSIS — F17200 Nicotine dependence, unspecified, uncomplicated: Secondary | ICD-10-CM

## 2024-06-11 ENCOUNTER — Encounter (INDEPENDENT_AMBULATORY_CARE_PROVIDER_SITE_OTHER): Payer: Self-pay

## 2024-06-12 ENCOUNTER — Other Ambulatory Visit (HOSPITAL_COMMUNITY): Payer: Self-pay

## 2024-06-12 NOTE — Progress Notes (Signed)
 Specialty Pharmacy Refill Coordination Note  Krystal Brooks is a 68 y.o. female contacted today regarding refills of specialty medication(s) Capecitabine  (XELODA )   Patient requested [name] at [facility] at [place] date: 06/15/24   Medication will be filled on 06/14/24.

## 2024-06-13 ENCOUNTER — Other Ambulatory Visit: Payer: Self-pay

## 2024-06-20 ENCOUNTER — Other Ambulatory Visit: Payer: Self-pay | Admitting: Family Medicine

## 2024-06-20 DIAGNOSIS — J441 Chronic obstructive pulmonary disease with (acute) exacerbation: Secondary | ICD-10-CM

## 2024-06-20 DIAGNOSIS — F17200 Nicotine dependence, unspecified, uncomplicated: Secondary | ICD-10-CM

## 2024-06-21 ENCOUNTER — Other Ambulatory Visit: Payer: Self-pay

## 2024-06-21 ENCOUNTER — Telehealth: Payer: Self-pay

## 2024-06-21 MED ORDER — ALBUTEROL SULFATE HFA 108 (90 BASE) MCG/ACT IN AERS: INHALATION_SPRAY | RESPIRATORY_TRACT | 2 refills | Status: DC

## 2024-06-21 NOTE — Telephone Encounter (Signed)
 Prescription Request  06/21/2024  LOV: 08/18/23  What is the name of the medication or equipment? albuterol  (VENTOLIN  HFA) 108 (90 Base) MCG/ACT inhaler [[id number]]   Have you contacted your pharmacy to request a refill? Yes   Which pharmacy would you like this sent to?  [id number] [place], [place] - [address] [place] [address] Phone: [phone] Fax: [phone]    Patient notified that their request is being sent to the clinical staff for review and that they should receive a response within 2 business days.   Please advise at [facility] [phone]

## 2024-06-21 NOTE — Telephone Encounter (Signed)
 Sent in medication

## 2024-06-27 DIAGNOSIS — Z780 Asymptomatic menopausal state: Secondary | ICD-10-CM | POA: Diagnosis not present

## 2024-06-27 DIAGNOSIS — Z1721 Progesterone receptor positive status: Secondary | ICD-10-CM | POA: Diagnosis not present

## 2024-06-27 DIAGNOSIS — R918 Other nonspecific abnormal finding of lung field: Secondary | ICD-10-CM | POA: Diagnosis not present

## 2024-06-27 DIAGNOSIS — F17210 Nicotine dependence, cigarettes, uncomplicated: Secondary | ICD-10-CM | POA: Diagnosis not present

## 2024-06-27 DIAGNOSIS — R110 Nausea: Secondary | ICD-10-CM | POA: Diagnosis not present

## 2024-06-27 DIAGNOSIS — C50911 Malignant neoplasm of unspecified site of right female breast: Secondary | ICD-10-CM | POA: Diagnosis not present

## 2024-06-27 DIAGNOSIS — C787 Secondary malignant neoplasm of liver and intrahepatic bile duct: Secondary | ICD-10-CM | POA: Diagnosis not present

## 2024-06-27 DIAGNOSIS — Z8639 Personal history of other endocrine, nutritional and metabolic disease: Secondary | ICD-10-CM | POA: Diagnosis not present

## 2024-06-27 DIAGNOSIS — Z9221 Personal history of antineoplastic chemotherapy: Secondary | ICD-10-CM | POA: Diagnosis not present

## 2024-06-27 DIAGNOSIS — G629 Polyneuropathy, unspecified: Secondary | ICD-10-CM | POA: Diagnosis not present

## 2024-06-27 DIAGNOSIS — Z515 Encounter for palliative care: Secondary | ICD-10-CM | POA: Diagnosis not present

## 2024-06-27 DIAGNOSIS — J90 Pleural effusion, not elsewhere classified: Secondary | ICD-10-CM | POA: Diagnosis not present

## 2024-06-27 DIAGNOSIS — C50919 Malignant neoplasm of unspecified site of unspecified female breast: Secondary | ICD-10-CM | POA: Diagnosis not present

## 2024-06-27 DIAGNOSIS — Z6825 Body mass index (BMI) 25.0-25.9, adult: Secondary | ICD-10-CM | POA: Diagnosis not present

## 2024-06-27 DIAGNOSIS — C7951 Secondary malignant neoplasm of bone: Secondary | ICD-10-CM | POA: Diagnosis not present

## 2024-06-27 DIAGNOSIS — Z1732 Human epidermal growth factor receptor 2 negative status: Secondary | ICD-10-CM | POA: Diagnosis not present

## 2024-06-27 DIAGNOSIS — Z170 Estrogen receptor positive status [ER+]: Secondary | ICD-10-CM | POA: Diagnosis not present

## 2024-06-27 DIAGNOSIS — R635 Abnormal weight gain: Secondary | ICD-10-CM | POA: Diagnosis not present

## 2024-06-27 DIAGNOSIS — L271 Localized skin eruption due to drugs and medicaments taken internally: Secondary | ICD-10-CM | POA: Diagnosis not present

## 2024-07-05 DIAGNOSIS — C50919 Malignant neoplasm of unspecified site of unspecified female breast: Secondary | ICD-10-CM | POA: Diagnosis not present

## 2024-07-05 DIAGNOSIS — C787 Secondary malignant neoplasm of liver and intrahepatic bile duct: Secondary | ICD-10-CM | POA: Diagnosis not present

## 2024-07-09 ENCOUNTER — Other Ambulatory Visit: Payer: Self-pay

## 2024-07-11 ENCOUNTER — Other Ambulatory Visit: Payer: Self-pay

## 2024-07-11 ENCOUNTER — Ambulatory Visit

## 2024-07-11 ENCOUNTER — Other Ambulatory Visit: Payer: Self-pay | Admitting: Pharmacy Technician

## 2024-07-11 DIAGNOSIS — C50912 Malignant neoplasm of unspecified site of left female breast: Secondary | ICD-10-CM | POA: Diagnosis not present

## 2024-07-11 DIAGNOSIS — C7951 Secondary malignant neoplasm of bone: Secondary | ICD-10-CM | POA: Diagnosis not present

## 2024-07-11 DIAGNOSIS — C787 Secondary malignant neoplasm of liver and intrahepatic bile duct: Secondary | ICD-10-CM | POA: Diagnosis not present

## 2024-07-11 DIAGNOSIS — C50919 Malignant neoplasm of unspecified site of unspecified female breast: Secondary | ICD-10-CM | POA: Diagnosis not present

## 2024-07-11 NOTE — Progress Notes (Signed)
 Disenrolled; Spoke with patient and states no longer need Xeloda . treatment will be changing but she won't know to what until a couple weeks. Rph Beth verified & to go ahead & disenroll.

## 2024-07-18 ENCOUNTER — Other Ambulatory Visit: Payer: Self-pay | Admitting: Family Medicine

## 2024-07-18 DIAGNOSIS — J441 Chronic obstructive pulmonary disease with (acute) exacerbation: Secondary | ICD-10-CM

## 2024-07-18 DIAGNOSIS — F17200 Nicotine dependence, unspecified, uncomplicated: Secondary | ICD-10-CM

## 2024-07-20 NOTE — Telephone Encounter (Signed)
 Requested Prescriptions  Pending Prescriptions Disp Refills   budesonide -formoterol  (SYMBICORT ) 80-4.5 MCG/ACT inhaler [Pharmacy Med Name: BUDESONIDE -FORMOTEROL  80-4.5] 10.2 each 0    Sig: Inhale 2 puffs into the lungs 2 (two) times daily as needed (for flares).     Pulmonology:  Combination Products Failed - 07/20/2024  1:25 PM      Failed - Valid encounter within last 12 months    Recent Outpatient Visits           11 months ago Benign essential HTN   [place] [facility] Pickard, Butler DASEN, MD   1 year ago Acute non-recurrent maxillary sinusitis   [place] [facility] Kayla Jeoffrey RAMAN, FNP   2 years ago Left sided sciatica   [place] [facility] Duanne Butler DASEN, MD   2 years ago COPD exacerbation [facility])   [place] [facility] Duanne Butler DASEN, MD   2 years ago Hypomagnesemia   [place] [facility] Pickard, Butler DASEN, MD

## 2024-08-13 ENCOUNTER — Other Ambulatory Visit: Payer: Self-pay | Admitting: Family Medicine

## 2024-08-14 DIAGNOSIS — C7951 Secondary malignant neoplasm of bone: Secondary | ICD-10-CM | POA: Diagnosis not present

## 2024-08-14 DIAGNOSIS — C50312 Malignant neoplasm of lower-inner quadrant of left female breast: Secondary | ICD-10-CM | POA: Diagnosis not present

## 2024-08-14 DIAGNOSIS — C787 Secondary malignant neoplasm of liver and intrahepatic bile duct: Secondary | ICD-10-CM | POA: Diagnosis not present

## 2024-08-14 DIAGNOSIS — C50919 Malignant neoplasm of unspecified site of unspecified female breast: Secondary | ICD-10-CM | POA: Diagnosis not present

## 2024-08-16 ENCOUNTER — Other Ambulatory Visit: Payer: Self-pay | Admitting: Family Medicine

## 2024-08-16 DIAGNOSIS — F17200 Nicotine dependence, unspecified, uncomplicated: Secondary | ICD-10-CM

## 2024-08-16 DIAGNOSIS — J441 Chronic obstructive pulmonary disease with (acute) exacerbation: Secondary | ICD-10-CM

## 2024-08-22 ENCOUNTER — Ambulatory Visit: Payer: Self-pay | Admitting: *Deleted

## 2024-08-22 NOTE — Telephone Encounter (Signed)
 FYI Only or Action Required?: FYI only for provider: appointment scheduled on 12/25.  Patient was last seen in primary care on 08/18/2023 by Duanne Butler DASEN, MD.  Called Nurse Triage reporting Nasal Congestion.  Symptoms began several weeks ago.  Interventions attempted: OTC medications: Mucinex .  Symptoms are: gradually worsening.  Triage Disposition: See PCP When Office is Open (Within 3 Days)  Patient/caregiver understands and will follow disposition?: Yes  Copied from CRM [id number]. Topic: Clinical - Red Word Triage >> Aug 22, 2024  8:09 AM Ivette P wrote: Red Word that prompted transfer to Nurse Triage: sore throat, chest congestion, headache.   Blwoing out blood. Reason for Disposition  [1] Sinus congestion (pressure, fullness) AND [2] present > 10 days  Answer Assessment - Initial Assessment Questions Offered open appointment at alternative location- patient declined- scheduled for Friday.   1. LOCATION: Where does it hurt?      Headache, sinus pressure, sore throat 2. ONSET: When did the sinus pain start?  (e.g., hours, days)      2 weeks   5. NASAL CONGESTION: Is the nose blocked? If Yes, ask: Can you open it or must you breathe through your mouth?     Able to open 6. NASAL DISCHARGE: Do you have discharge from your nose? If so ask, What color?     Bloody, ropey 7. FEVER: Do you have a fever? If Yes, ask: What is it, how was it measured, and when did it start?      no 8. OTHER SYMPTOMS: Do you have any other symptoms? (e.g., sore throat, cough, earache, difficulty breathing)     Sore throat  Protocols used: Sinus Pain or Congestion-A-AH

## 2024-08-24 ENCOUNTER — Encounter: Payer: Self-pay | Admitting: Family Medicine

## 2024-08-24 ENCOUNTER — Ambulatory Visit (INDEPENDENT_AMBULATORY_CARE_PROVIDER_SITE_OTHER): Admitting: Family Medicine

## 2024-08-24 VITALS — BP 136/60 | HR 109 | Temp 99.2°F | Ht 61.0 in | Wt 118.0 lb

## 2024-08-24 DIAGNOSIS — J209 Acute bronchitis, unspecified: Secondary | ICD-10-CM

## 2024-08-24 DIAGNOSIS — C50919 Malignant neoplasm of unspecified site of unspecified female breast: Secondary | ICD-10-CM

## 2024-08-24 DIAGNOSIS — I10 Essential (primary) hypertension: Secondary | ICD-10-CM | POA: Diagnosis not present

## 2024-08-24 DIAGNOSIS — C787 Secondary malignant neoplasm of liver and intrahepatic bile duct: Secondary | ICD-10-CM

## 2024-08-24 MED ORDER — LOSARTAN POTASSIUM 100 MG PO TABS: 100.0000 mg | ORAL_TABLET | Freq: Every day | ORAL | 0 refills | Status: AC

## 2024-08-24 MED ORDER — AZITHROMYCIN 250 MG PO TABS: ORAL_TABLET | ORAL | 0 refills | Status: AC

## 2024-08-24 NOTE — Progress Notes (Addendum)
 "  Patient Office Visit  Assessment & Plan:  Acute bronchitis, unspecified organism -     SARS-CoV-2 RNA, Influenza A/B, and RSV RNA, Qualitative NAAT -     Azithromycin ; Take 2 tablets on day 1, then 1 tablet daily on days 2 through 5  Dispense: 6 tablet; Refill: 0  Breast cancer metastasized to liver, unspecified laterality (HCC)  Primary hypertension -     Losartan  Potassium; Take 1 tablet (100 mg total) by mouth daily.  Dispense: 90 tablet; Refill: 0   Assessment and Plan    Acute bronchitis Symptoms of chest congestion, phlegm, and cough for 1.5 weeks. Oxygen saturation at 90% concerning due to immunosuppression from cancer treatment. Differential includes pneumonia, but no fever or worsening symptoms. COVID-19 test negative. - Ordered respiratory panel. - Prescribed azithromycin  (Z-Pak). - Advised monitoring oxygen saturation with pulse oximeter. 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 On Kisqali since December 2024. Immunocompromised due to treatment, increasing infection risk. Regular scans scheduled monthly. - Continue Kisqali with regular EKGs every two weeks. - Continue monthly scans as scheduled.  Suspected chronic obstructive pulmonary disease Suspected COPD due to smoking history and respiratory symptoms. Oxygen saturation consistently in the 90s. Concerns about inhaler efficacy and potential COPD diagnosis. - Schedule appointment with [facility]ologist at [facility]. - Continue albuterol  inhaler twice daily. - Continue Symbicort .      Patient will have upcoming appt at [facility] on Monday.  Patient knows to go to ED if worsening symptoms Will follow up on respiratory panel results.   Return if symptoms worsen or fail to improve.   Subjective:    Patient ID: Krystal Brooks, female    DOB: [birth year]  Age: 68 y.o. MRN: [id number]  Chief Complaint   Patient presents with   Nausea    Chest/nasal congestion, nausea, weakness x 1.5 weeks.     HPI Discussed the use of AI scribe software for clinical note transcription with the patient, who gave verbal consent to proceed.  History of Present Illness        History of Present Illness Krystal Brooks is a 67 year old female with metastatic breast cancer who presents with persistent respiratory symptoms. patient goes to [facility] for breast cancer treatments and has upcoming appt this Monday at [facility].   She has been experiencing respiratory symptoms for about a week and a half, including chest congestion and excessive phlegm production. A sore throat was present on Monday but resolved by Tuesday. She continues to cough up phlegm, leading to gagging and occasional vomiting. No fever, chills, or flu-like symptoms are reported. Despite exposure to her granddaughter who had COVID-19, she tested negative for the virus. She has had multiple scans done at [facility] and will have more imaging next week. Patient using Mucinex without D but not helping very much. Patient has not received flu vaccine this season due to concerns re vaccinations in general  Her medical history includes metastatic lobular breast cancer, initially diagnosed in 2018, with metastases to the liver and spine. She underwent a double mastectomy in 2018 and has been on various treatments since, including Xeloda  and currently Kisqali, which she started in December. She requires bi-weekly EKGs due to her current treatment regimen.  Possible COPD- she uses an albuterol  inhaler twice daily and Symbicort  for respiratory symptoms, but these are not fully effective. She has a history of smoking,  having quit five to six years ago. She does have Pulmonary consult ordered but no appt yet at [facility]  She denies any history of pneumonia or blood clots and has not been sick in the past three years aside from her cancer diagnosis. She is allergic to  multiple antibiotics, with Z-Pak being the only one she can tolerate.  Her family history is notable for a sister who died from a vaccine-related incident at 65 old, leading her to avoid vaccinations. Her social history includes a granddaughter who is twelve years old and has been a source of illness exposure.  Physical Exam VITALS: SaO2- 90% CHEST: Lungs without wheezing,  Heart-  tachycardia due to recent illness.  Results Radiology Chest and abdomen scan (05/2024): Stable disease  Assessment and Plan Acute bronchitis Symptoms of chest congestion, phlegm, and cough for 1.5 weeks. Oxygen saturation at 90% concerning due to immunosuppression from cancer treatment. Differential includes pneumonia, but no fever or worsening symptoms. COVID-19 test negative. - Ordered respiratory panel. - Prescribed azithromycin  (Z-Pak). - Advised monitoring oxygen saturation with pulse oximeter. - Instructed to seek emergency care if oxygen saturation drops below 90% or worsening symptoms  Metastatic estrogen receptor positive breast cancer to the  Liver and spine involvement. On Kisqali since December 2024. Immunocompromised due to treatment, increasing infection risk. Regular scans scheduled monthly. - Continue Kisqali with regular EKGs every two weeks. - Continue follow up at [facility]  Suspected chronic obstructive pulmonary disease Suspected COPD due to smoking history and respiratory symptoms. Oxygen saturation consistently in the 90s. Concerns about inhaler efficacy and potential COPD diagnosis. - Schedule appointment with pulmonologist at [facility]. - Continue albuterol  inhaler twice daily. - Continue Symbicort .    The 10-year ASCVD risk score (Arnett DK, et al., 2019) is: 11.8%  Past Medical History:  Diagnosis Date   Adie's pupil    LEFT EYE   Allergy    Asthma    Atherosclerosis of aorta    bulge above renal arteries on mri 2016   Cancer (HCC) 2017   right breast ca    Cellulitis    chest   Complication of anesthesia    VERY HARD TO WAKE UP     Eczema    Hypertension    Personal history of radiation therapy    PONV (postoperative nausea and vomiting)    Smoker    Past Surgical History:  Procedure Laterality Date   ABDOMINAL HYSTERECTOMY     BREAST BIOPSY Right 01/02/2016   BREAST EXCISIONAL BIOPSY Right 1979   BREAST IMPLANT REMOVAL Bilateral 10/05/2017   Procedure: REMOVALOF BILATERAL BREAST IMPLANTS;  Surgeon: Lowery Estefana RAMAN, DO;  Location: [facility];  Service: Plastics;  Laterality: Bilateral;   BREAST LUMPECTOMY Right 01/27/2016   BREAST LUMPECTOMY WITH RADIOACTIVE SEED AND SENTINEL LYMPH NODE BIOPSY Right 01/27/2016   Procedure: BREAST LUMPECTOMY WITH RADIOACTIVE SEED AND SENTINEL LYMPH NODE BIOPSY;  Surgeon: Morene Olives, MD;  Location: [facility];  Service: General;  Laterality: Right;   BREAST RECONSTRUCTION WITH PLACEMENT OF TISSUE EXPANDER AND FLEX HD (ACELLULAR HYDRATED DERMIS) Bilateral 03/16/2017   Procedure: BILATERAL IMMEDIATE BREAST RECONSTRUCTION WITH PLACEMENT OF TISSUE EXPANDER AND FLEX HD (ACELLULAR HYDRATED DERMIS);  Surgeon: Lowery Estefana RAMAN, DO;  Location: [facility];  Service: Plastics;  Laterality: Bilateral;   BREAST SURGERY  1979   cyst removed rt breast   CATARACT EXTRACTION W/ INTRAOCULAR LENS IMPLANT     RT EYE   CESAREAN SECTION  x2   EXCISION OF BREAST LESION Left 01/30/2018   Procedure: EXCISION OF LEFT BREAST SCAR;  Surgeon: Lowery Estefana RAMAN, DO;  Location: [facility];  Service: Plastics;  Laterality: Left;   LATISSIMUS FLAP TO BREAST Right 01/30/2018   Procedure: RIGHT LATISSIMUS MYOCUTANEOUS FLAP FOR RIGHT CHEST WALL RECONSTRUCTION;  Surgeon: Lowery Estefana RAMAN, DO;  Location: [facility];  Service: Plastics;  Laterality: Right;   MASTECTOMY W/ SENTINEL NODE BIOPSY Bilateral 03/16/2017   Procedure: BILATERAL TOTAL MASTECTOMY WITH LEFT SENTINEL LYMPH NODE BIOPSY;  Surgeon:  Mikell Katz, MD;  Location: [facility];  Service: General;  Laterality: Bilateral;   REMOVAL OF BILATERAL TISSUE EXPANDERS WITH PLACEMENT OF BILATERAL BREAST IMPLANTS Bilateral 06/15/2017   Procedure: REMOVAL OF BILATERAL TISSUE EXPANDERS WITH PLACEMENT OF BILATERAL BREAST IMPLANTS;  Surgeon: Lowery Estefana RAMAN, DO;  Location: [facility];  Service: Plastics;  Laterality: Bilateral;   TOTAL MASTECTOMY Bilateral 03/16/2017   SENTINAL NODE BIOPSY   Social History[1] Family History  Problem Relation Age of Onset   Hearing loss Mother    Asthma Father    Cancer Father        melanoma, metastases    Diabetes Father    Heart disease Father    Early death Sister    Early death Paternal Grandfather    Cancer Maternal Grandmother 29       colon cancer    Breast cancer Neg Hx    Ovarian cancer Neg Hx    Allergies[2]  ROS    Objective:    BP 136/60   Pulse (!) 109   Temp 99.2 F (37.3 C)   Ht 5' 1 (1.549 m)   Wt 118 lb (53.5 kg)   SpO2 90%   BMI 22.30 kg/m  BP Readings from Last 3 Encounters:  08/24/24 136/60  08/26/23 126/78  08/18/23 (!) 144/90   Wt Readings from Last 3 Encounters:  08/24/24 118 lb (53.5 kg)  08/18/23 122 lb 8 oz (55.6 kg)  05/05/23 121 lb (54.9 kg)    Physical Exam Vitals and nursing note reviewed.  Constitutional:      General: She is not in acute distress.    Appearance: Normal appearance.  HENT:     Head: Normocephalic.     Right Ear: Tympanic membrane, ear canal and external ear normal.     Left Ear: Tympanic membrane, ear canal and external ear normal.     Nose: Congestion present.     Mouth/Throat:     Pharynx: No oropharyngeal exudate or posterior oropharyngeal erythema.  Eyes:     Extraocular Movements: Extraocular movements intact.     Pupils: Pupils are equal, round, and reactive to light.  Cardiovascular:     Rate and Rhythm: Regular rhythm. Tachycardia present.     Heart sounds: Normal heart sounds.  Pulmonary:      Effort: Pulmonary effort is normal.     Breath sounds: Normal breath sounds. No wheezing or rhonchi.  Neurological:     General: No focal deficit present.     Mental Status: She is alert and oriented to person, place, and time.  Psychiatric:        Mood and Affect: Mood normal.        Behavior: Behavior normal.      No results found for any visits on 08/24/24.          [1]  Social History Tobacco Use   Smoking status: Former    Current packs/day: 0.50  Average packs/day: 0.5 packs/day for 40.0 years (20.0 ttl pk-yrs)    Types: Cigarettes   Smokeless tobacco: Never  Vaping Use   Vaping status: Never Used  Substance Use Topics   Alcohol use: Yes    Alcohol/week: 0.0 standard drinks of alcohol    Comment: social   Drug use: No  [2]  Allergies Allergen Reactions   Gabapentin  Other (See Comments)    Pt states sedates her   Penicillins Anaphylaxis, Swelling, Rash and Other (See Comments)    Older sister died from reaction to penicillin Has patient had a PCN reaction causing immediate rash, facial/tongue/throat swelling, SOB or lightheadedness with hypotension: Yes Has patient had a PCN reaction causing severe rash involving mucus membranes or skin necrosis: No Has patient had a PCN reaction that required hospitalization: No Has patient had a PCN reaction occurring within the last 10 years: No If all of the above answers are NO, then may proceed with Cephalosporin use.    Sulfa Antibiotics Rash   Sulfasalazine Dermatitis   Chlorhexidine  Gluconate Hives   Levaquin  [Levofloxacin ] Nausea And Vomiting   "

## 2024-08-29 ENCOUNTER — Ambulatory Visit: Payer: Self-pay | Admitting: Family Medicine

## 2024-08-29 LAB — SARS-COV-2 RNA, INFLUENZA A/B, AND RSV RNA, QUALITATIVE NAAT
INFLUENZA A RNA: NOT DETECTED
INFLUENZA B RNA: NOT DETECTED
RSV RNA: NOT DETECTED
SARS COV2 RNA: NOT DETECTED

## 2024-09-17 ENCOUNTER — Other Ambulatory Visit: Payer: Self-pay | Admitting: Family Medicine

## 2024-09-17 DIAGNOSIS — J441 Chronic obstructive pulmonary disease with (acute) exacerbation: Secondary | ICD-10-CM

## 2024-09-17 DIAGNOSIS — F17200 Nicotine dependence, unspecified, uncomplicated: Secondary | ICD-10-CM

## 2024-09-26 ENCOUNTER — Ambulatory Visit

## 2024-09-26 VITALS — Ht 61.0 in | Wt 118.0 lb

## 2024-09-26 DIAGNOSIS — Z0000 Encounter for general adult medical examination without abnormal findings: Secondary | ICD-10-CM | POA: Diagnosis not present

## 2024-09-26 MED ORDER — ALBUTEROL SULFATE HFA 108 (90 BASE) MCG/ACT IN AERS: INHALATION_SPRAY | RESPIRATORY_TRACT | 0 refills | Status: AC

## 2024-09-26 NOTE — Progress Notes (Cosign Needed Addendum)
 "  Chief Complaint  Patient presents with   Medicare Wellness     Subjective:   Krystal Brooks is a 69 y.o. female who presents for a Medicare Annual Wellness Visit.  Visit info / Clinical Intake: Medicare Wellness Visit Type:: Subsequent Annual Wellness Visit Persons participating in visit and providing information:: patient Medicare Wellness Visit Mode:: Telephone If telephone:: video declined Since this visit was completed virtually, some vitals may be partially provided or unavailable. Missing vitals are due to the limitations of the virtual format.: Documented vitals are patient reported If Telephone or Video please confirm:: I connected with patient using audio/video enable telemedicine. I verified patient identity with two identifiers, discussed telehealth limitations, and patient agreed to proceed. Patient Location:: home Provider Location:: home office Interpreter Needed?: No Pre-visit prep was completed: yes AWV questionnaire completed by patient prior to visit?: no Living arrangements:: lives with spouse/significant other Patient's Overall Health Status Rating: good Typical amount of pain: some Does pain affect daily life?: no Are you currently prescribed opioids?: no  Dietary Habits and Nutritional Risks How many meals a day?: 3 Eats fruit and vegetables daily?: yes Most meals are obtained by: preparing own meals In the last 2 weeks, have you had any of the following?: none Diabetic:: no  Functional Status Activities of Daily Living (to include ambulation/medication): Independent Ambulation: Independent Medication Administration: Independent Home Management (perform basic housework or laundry): Independent Manage your own finances?: yes Primary transportation is: driving Concerns about vision?: no *vision screening is required for WTM* Concerns about hearing?: no  Fall Screening Falls in the past year?: 0 Number of falls in past year: 0 Was there an injury  with Fall?: 0 Fall Risk Category Calculator: 0 Patient Fall Risk Level: Low Fall Risk  Fall Risk Patient at Risk for Falls Due to: No Fall Risks Fall risk Follow up: Falls evaluation completed; Education provided; Falls prevention discussed  Home and Transportation Safety: All rugs have non-skid backing?: yes All stairs or steps have railings?: yes Grab bars in the bathtub or shower?: yes Have non-skid surface in bathtub or shower?: yes Good home lighting?: yes Regular seat belt use?: yes Hospital stays in the last year:: no  Cognitive Assessment Difficulty concentrating, remembering, or making decisions? : no Will 6CIT or Mini Cog be Completed: no 6CIT or Mini Cog Declined: patient alert, oriented, able to answer questions appropriately and recall recent events  Advance Directives (For Healthcare) Does Patient Have a Medical Advance Directive?: No Would patient like information on creating a medical advance directive?: Yes (MAU/Ambulatory/Procedural Areas - Information given)  Reviewed/Updated  Reviewed/Updated: Reviewed All (Medical, Surgical, Family, Medications, Allergies, Care Teams, Patient Goals)    Allergies (verified) Gabapentin , Penicillins, Sulfa antibiotics, Sulfasalazine, Chlorhexidine  gluconate, and Levaquin  [levofloxacin ]   Current Medications (verified) Outpatient Encounter Medications as of 09/26/2024  Medication Sig   budesonide -formoterol  (SYMBICORT ) 80-4.5 MCG/ACT inhaler INHALE 2 PUFFS INTO THE LUNGS 2 (TWO) TIMES DAILY AS NEEDED FOR FLARES   denosumab  (XGEVA ) 120 MG/1.7ML SOLN injection Inject 120 mg into the skin every 30 (thirty) days. Elenor Sayer, MD at [facility] prescribes this.   escitalopram  (LEXAPRO ) 10 MG tablet TAKE 1 TABLET BY MOUTH EVERY DAY   letrozole (FEMARA) 2.5 MG tablet Take 2.5 mg by mouth daily.   losartan  (COZAAR ) 100 MG tablet Take 1 tablet (100 mg total) by mouth daily.   montelukast  (SINGULAIR ) 10 MG tablet TAKE 1 TABLET BY MOUTH  EVERYDAY AT BEDTIME   Multiple Vitamin (MULTIVITAMIN WITH MINERALS) TABS tablet  Take 1 tablet by mouth daily.   promethazine  (PHENERGAN ) 12.5 MG tablet Take 1 tablet (12.5 mg total) by mouth every 6 (six) hours as needed for nausea or vomiting.   ribociclib succ (KISQALI 600MG  DAILY DOSE) 200 MG Therapy Pack Take 600 mg by mouth.   [DISCONTINUED] albuterol  (VENTOLIN  HFA) 108 (90 Base) MCG/ACT inhaler INHALE 1-2 PUFFS EVERY 6 HOURS AS NEEDED FOR WHEEZING.   albuterol  (PROVENTIL ) (2.5 MG/3ML) 0.083% nebulizer solution TAKE 3 MLS (2.5 MG TOTAL) BY NEBULIZATION EVERY 6 HOURS AS NEEDED WHEEZING OR SHORTNESS OF BREATH. (Patient not taking: Reported on 09/26/2024)   albuterol  (VENTOLIN  HFA) 108 (90 Base) MCG/ACT inhaler INHALE 1-2 PUFFS EVERY 6 HOURS AS NEEDED FOR WHEEZING.   capecitabine  (XELODA ) 500 MG tablet Take 1 tablet (500 mg total) by mouth 2 (two) times daily Take 1 tablet (500mg ) twice a day by mouth, 7 days on, 7 days off; then repeat. (Patient not taking: Reported on 09/26/2024)   feeding supplement (ENSURE ENLIVE / ENSURE PLUS) LIQD Take 237 mLs by mouth 2 (two) times daily between meals. (Patient not taking: Reported on 09/26/2024)   potassium chloride  (KLOR-CON  M10) 10 MEQ tablet Take 1 tablet (10 mEq total) by mouth daily. (Patient not taking: Reported on 09/26/2024)   traMADol  (ULTRAM ) 50 MG tablet Take 1 tablet (50 mg total) by mouth every 8 (eight) hours as needed. (Patient not taking: Reported on 09/26/2024)   No facility-administered encounter medications on file as of 09/26/2024.    History: Past Medical History:  Diagnosis Date   Adie's pupil    LEFT EYE   Allergy    Asthma    Atherosclerosis of aorta    bulge above renal arteries on mri 2016   Cancer (HCC) 2017   right breast ca   Cellulitis    chest   Complication of anesthesia    VERY HARD TO WAKE UP     Eczema    Hypertension    Personal history of radiation therapy    PONV (postoperative nausea and vomiting)    Smoker     Past Surgical History:  Procedure Laterality Date   ABDOMINAL HYSTERECTOMY     BREAST BIOPSY Right 01/02/2016   BREAST EXCISIONAL BIOPSY Right 1979   BREAST IMPLANT REMOVAL Bilateral 10/05/2017   Procedure: REMOVALOF BILATERAL BREAST IMPLANTS;  Surgeon: Lowery Estefana RAMAN, DO;  Location: [facility];  Service: Plastics;  Laterality: Bilateral;   BREAST LUMPECTOMY Right 01/27/2016   BREAST LUMPECTOMY WITH RADIOACTIVE SEED AND SENTINEL LYMPH NODE BIOPSY Right 01/27/2016   Procedure: BREAST LUMPECTOMY WITH RADIOACTIVE SEED AND SENTINEL LYMPH NODE BIOPSY;  Surgeon: Morene Olives, MD;  Location: [facility];  Service: General;  Laterality: Right;   BREAST RECONSTRUCTION WITH PLACEMENT OF TISSUE EXPANDER AND FLEX HD (ACELLULAR HYDRATED DERMIS) Bilateral 03/16/2017   Procedure: BILATERAL IMMEDIATE BREAST RECONSTRUCTION WITH PLACEMENT OF TISSUE EXPANDER AND FLEX HD (ACELLULAR HYDRATED DERMIS);  Surgeon: Lowery Estefana RAMAN, DO;  Location: [facility];  Service: Plastics;  Laterality: Bilateral;   BREAST SURGERY  1979   cyst removed rt breast   CATARACT EXTRACTION W/ INTRAOCULAR LENS IMPLANT     RT EYE   CESAREAN SECTION     x2   EXCISION OF BREAST LESION Left 01/30/2018   Procedure: EXCISION OF LEFT BREAST SCAR;  Surgeon: Lowery Estefana RAMAN, DO;  Location: [facility];  Service: Plastics;  Laterality: Left;   LATISSIMUS FLAP TO BREAST Right 01/30/2018   Procedure: RIGHT LATISSIMUS MYOCUTANEOUS FLAP FOR  RIGHT CHEST WALL RECONSTRUCTION;  Surgeon: Lowery Estefana RAMAN, DO;  Location: [facility];  Service: Plastics;  Laterality: Right;   MASTECTOMY W/ SENTINEL NODE BIOPSY Bilateral 03/16/2017   Procedure: BILATERAL TOTAL MASTECTOMY WITH LEFT SENTINEL LYMPH NODE BIOPSY;  Surgeon: Mikell Katz, MD;  Location: [facility];  Service: General;  Laterality: Bilateral;   REMOVAL OF BILATERAL TISSUE EXPANDERS WITH PLACEMENT OF BILATERAL BREAST IMPLANTS Bilateral 06/15/2017   Procedure: REMOVAL  OF BILATERAL TISSUE EXPANDERS WITH PLACEMENT OF BILATERAL BREAST IMPLANTS;  Surgeon: Lowery Estefana RAMAN, DO;  Location:  SUR[facility]ERY CENTER;  Service: Plastics;  Laterality: Bilateral;   TOTAL MASTECTOMY Bilateral 03/16/2017   SENTINAL NODE BIOPSY   Family History  Problem Relation Age of Onset   Hearing loss Mother    Asthma Father    Cancer Father        melanoma, metastases    Diabetes Father    Heart disease Father    Early death Sister    Early death Paternal Grandfather    Cancer Maternal Grandmother 66       colon cancer    Breast cancer Neg Hx    Ovarian cancer Neg Hx    Social History   Occupational History   Not on file  Tobacco Use   Smoking status: Former    Current packs/day: 0.50    Average packs/day: 0.5 packs/day for 40.0 years (20.0 ttl pk-yrs)    Types: Cigarettes   Smokeless tobacco: Never  Vaping Use   Vaping status: Never Used  Substance and Sexual Activity   Alcohol use: Yes    Alcohol/week: 0.0 standard drinks of alcohol    Comment: social   Drug use: No   Sexual activity: Yes    Birth control/protection: Surgical    Comment: hysterectomy   Tobacco Counseling Counseling given: Not Answered  SDOH Screenings   Food Insecurity: No Food Insecurity (09/26/2024)  Housing: Low Risk (09/26/2024)  Transportation Needs: No Transportation Needs (09/26/2024)  Utilities: Not At Risk (09/26/2024)  Alcohol Screen: Low Risk (02/10/2023)  Depression (PHQ2-9): Low Risk (09/26/2024)  Financial Resource Strain: Low Risk (02/10/2023)  Physical Activity: Insufficiently Active (09/26/2024)  Social Connections: Socially Integrated (09/26/2024)  Stress: No Stress Concern Present (09/26/2024)  Tobacco Use: Medium Risk (09/26/2024)  Health Literacy: Adequate Health Literacy (09/26/2024)   See flowsheets for full screening details  Depression Screen PHQ 2 & 9 Depression Scale- Over the past 2 weeks, how often have you been bothered by any of the following  problems? Little interest or pleasure in doing things: 0 Feeling down, depressed, or hopeless (PHQ Adolescent also includes...irritable): 0 PHQ-2 Total Score: 0     Goals Addressed             This Visit's Progress    Remain active and independent   On track            Objective:    Today's Vitals   09/26/24 1051  Weight: 118 lb (53.5 kg)  Height: 5' 1 (1.549 m)   Body mass index is 22.3 kg/m.  Hearing/Vision screen No results found. Immunizations and Health Maintenance Health Maintenance  Topic Date Due   DTaP/Tdap/Td (1 - Tdap) Never done   Pneumococcal Vaccine: 50+ Years (1 of 2 - PCV) Never done   Zoster Vaccines- Shingrix (1 of 2) Never done   Bone Density Scan  05/24/2018   Colonoscopy  08/30/2022   Lung Cancer Screening  11/10/2023   Influenza Vaccine  11/27/2024 (Originally 03/30/2024)  Medicare Annual Wellness (AWV)  09/26/2025   Hepatitis C Screening  Completed   Meningococcal B Vaccine  Aged Out   Mammogram  Discontinued   COVID-19 Vaccine  Discontinued        Assessment/Plan:  This is a routine wellness examination for [facility].  Patient Care Team: Duanne Butler DASEN, MD as PCP - General (Family Medicine) Ivin Kocher, MD as Consulting Physician (Dermatology) Dillingham, Estefana RAMAN, DO as Attending Physician (Plastic Surgery) Crawford, Morna Pickle, NP as Nurse Practitioner (Hematology and Oncology) Loretha Ash, MD as Medical Oncologist (Hematology and Oncology) Pickenpack-Cousar, Fannie SAILOR, NP as Nurse Practitioner (Nurse Practitioner) Dominick Lorene Glenn as Consulting Physician (Internal Medicine) Hardison, Venetia Ditch, MD (Pulmonary Disease)  I have personally reviewed and noted the following in the patients chart:   Medical and social history Use of alcohol, tobacco or illicit drugs  Current medications and supplements including opioid prescriptions. Functional ability and status Nutritional status Physical  activity Advanced directives List of other physicians Hospitalizations, surgeries, and ER visits in previous 12 months Vitals Screenings to include cognitive, depression, and falls Referrals and appointments  No orders of the defined types were placed in this encounter.  In addition, I have reviewed and discussed with patient certain preventive protocols, quality metrics, and best practice recommendations. A written personalized care plan for preventive services as well as general preventive health recommendations were provided to patient.   Krystal Brooks, [place]   8[birth year]   Return in 1 year (on 09/26/2025).  After Visit Summary: (MyChart) Due to this being a telephonic visit, the after visit summary with patients personalized plan was offered to patient via MyChart   Nurse Notes: Vaccines not given: recommended vaccines declined today (flu, pneumonia, shingrix, and tdap) Screenings not ordered: Declined Referral/Order for colon screening, dexa, and lung screening; actively being seen by oncology with imaging often   "

## 2024-09-26 NOTE — Addendum Note (Signed)
 Addended by: LAVELLE PFEIFFER B on: 09/26/2024 12:07 PM   Modules accepted: Orders

## 2024-09-26 NOTE — Patient Instructions (Addendum)
 Krystal Brooks,  Thank you for taking the time for your Medicare Wellness Visit. I appreciate your continued commitment to your health goals. Please review the care plan we discussed, and feel free to reach out if I can assist you further.  Please note that Annual Wellness Visits do not include a physical exam. Some assessments may be limited, especially if the visit was conducted virtually. If needed, we may recommend an in-person follow-up with your provider.  Ongoing Care Seeing your primary care provider every 3 to 6 months helps us  monitor your health and provide consistent, personalized care.   Referrals If a referral was made during today's visit and you haven't received any updates within two weeks, please contact the referred provider directly to check on the status.  Recommended Screenings:  Health Maintenance  Topic Date Due   DTaP/Tdap/Td vaccine (1 - Tdap) Never done   Pneumococcal Vaccine for age over 71 (1 of 2 - PCV) Never done   Zoster (Shingles) Vaccine (1 of 2) Never done   Osteoporosis screening with Bone Density Scan  05/24/2018   Colon Cancer Screening  08/30/2022   Screening for Lung Cancer  11/10/2023   Flu Shot  11/27/2024*   Medicare Annual Wellness Visit  09/26/2025   Hepatitis C Screening  Completed   Meningitis B Vaccine  Aged Out   Breast Cancer Screening  Discontinued   COVID-19 Vaccine  Discontinued  *Topic was postponed. The date shown is not the original due date.       09/26/2024   11:02 AM  Advanced Directives  Does Patient Have a Medical Advance Directive? No  Would patient like information on creating a medical advance directive? Yes (MAU/Ambulatory/Procedural Areas - Information given)   Information on Advanced Care Planning can be found at [place]  Secretary of [facility] Advance Health Care Directives Advance Health Care Directives ([email or web address]   Vision: Annual vision screenings are recommended for early detection of glaucoma, cataracts, and  diabetic retinopathy. These exams can also reveal signs of chronic conditions such as diabetes and high blood pressure.  Dental: Annual dental screenings help detect early signs of oral cancer, gum disease, and other conditions linked to overall health, including heart disease and diabetes.  Please see the attached documents for additional preventive care recommendations.
